# Patient Record
Sex: Female | Born: 1940
Health system: Southern US, Community
[De-identification: ages and names within clinical notes are randomized; demographics above are authoritative.]

## PROBLEM LIST (undated history)

## (undated) DIAGNOSIS — E669 Obesity, unspecified: Secondary | ICD-10-CM

## (undated) DIAGNOSIS — M8580 Other specified disorders of bone density and structure, unspecified site: Secondary | ICD-10-CM

## (undated) DIAGNOSIS — K317 Polyp of stomach and duodenum: Secondary | ICD-10-CM

## (undated) DIAGNOSIS — K5790 Diverticulosis of intestine, part unspecified, without perforation or abscess without bleeding: Secondary | ICD-10-CM

## (undated) DIAGNOSIS — L719 Rosacea, unspecified: Secondary | ICD-10-CM

## (undated) DIAGNOSIS — R112 Nausea with vomiting, unspecified: Secondary | ICD-10-CM

## (undated) DIAGNOSIS — E119 Type 2 diabetes mellitus without complications: Secondary | ICD-10-CM

## (undated) DIAGNOSIS — I4891 Unspecified atrial fibrillation: Secondary | ICD-10-CM

## (undated) DIAGNOSIS — T4145XA Adverse effect of unspecified anesthetic, initial encounter: Secondary | ICD-10-CM

## (undated) DIAGNOSIS — I10 Essential (primary) hypertension: Secondary | ICD-10-CM

## (undated) DIAGNOSIS — R5383 Other fatigue: Secondary | ICD-10-CM

## (undated) DIAGNOSIS — Z780 Asymptomatic menopausal state: Secondary | ICD-10-CM

## (undated) DIAGNOSIS — E785 Hyperlipidemia, unspecified: Secondary | ICD-10-CM

## (undated) DIAGNOSIS — Z9889 Other specified postprocedural states: Secondary | ICD-10-CM

## (undated) DIAGNOSIS — M1990 Unspecified osteoarthritis, unspecified site: Secondary | ICD-10-CM

## (undated) DIAGNOSIS — E039 Hypothyroidism, unspecified: Secondary | ICD-10-CM

## (undated) DIAGNOSIS — E559 Vitamin D deficiency, unspecified: Secondary | ICD-10-CM

## (undated) DIAGNOSIS — T8859XA Other complications of anesthesia, initial encounter: Secondary | ICD-10-CM

## (undated) DIAGNOSIS — E049 Nontoxic goiter, unspecified: Secondary | ICD-10-CM

## (undated) DIAGNOSIS — I639 Cerebral infarction, unspecified: Secondary | ICD-10-CM

## (undated) HISTORY — DX: Rosacea, unspecified: L71.9

## (undated) HISTORY — DX: Nontoxic goiter, unspecified: E04.9

## (undated) HISTORY — DX: Essential (primary) hypertension: I10

## (undated) HISTORY — DX: Polyp of stomach and duodenum: K31.7

## (undated) HISTORY — DX: Vitamin D deficiency, unspecified: E55.9

## (undated) HISTORY — DX: Hypothyroidism, unspecified: E03.9

## (undated) HISTORY — DX: Hyperlipidemia, unspecified: E78.5

## (undated) HISTORY — DX: Cerebral infarction, unspecified: I63.9

## (undated) HISTORY — DX: Asymptomatic menopausal state: Z78.0

## (undated) HISTORY — DX: Unspecified atrial fibrillation: I48.91

## (undated) HISTORY — DX: Type 2 diabetes mellitus without complications: E11.9

## (undated) HISTORY — DX: Diverticulosis of intestine, part unspecified, without perforation or abscess without bleeding: K57.90

## (undated) HISTORY — PX: TONSILLECTOMY: SUR1361

## (undated) HISTORY — DX: Other fatigue: R53.83

## (undated) HISTORY — DX: Obesity, unspecified: E66.9

## (undated) HISTORY — DX: Other specified disorders of bone density and structure, unspecified site: M85.80

## (undated) NOTE — *Deleted (*Deleted)
Stroke Discharge Summary  Patient ID: Sheila Ray   MRN: [id number]      DOB: Jun 20, 1941  Date of Admission: 05/03/2020 Date of Discharge: 05/10/2020  Attending Physician:  Micki Riley, MD, Stroke MD Consultant(s):   {consultation:[id number]}*** Patient's PCP:  Junie Spencer, FNP  Discharge Diagnoses: *** Active Problems:   Acute ischemic left MCA stroke (HCC)   Middle cerebral artery embolism, left   Hypoxia   Encephalopathy acute   Medications to be continued on Rehab Allergies as of 05/10/2020      Reactions   Crestor [rosuvastatin] Other (See Comments)   Cramps   Lipitor [atorvastatin] Other (See Comments)   Cramps   Pravastatin Other (See Comments)   Cramps    Keflex [cephalexin] Hives    Med Rec must be completed prior to using this SMARTLINK***       LABORATORY STUDIES CBC    Component Value Date/Time   WBC 13.8 (H) 05/09/2020 0353   RBC 4.33 05/09/2020 0353   HGB 10.0 (L) 05/09/2020 0353   HGB 13.1 09/16/2019 1136   HCT 33.9 (L) 05/09/2020 0353   HCT 41.4 09/16/2019 1136   PLT 349 05/09/2020 0353   PLT 333 09/16/2019 1136   MCV 78.3 (L) 05/09/2020 0353   MCV 80 09/16/2019 1136   MCH 23.1 (L) 05/09/2020 0353   MCHC 29.5 (L) 05/09/2020 0353   RDW 16.8 (H) 05/09/2020 0353   RDW 13.9 09/16/2019 1136   LYMPHSABS 0.8 05/04/2020 0317   LYMPHSABS 2.0 09/16/2019 1136   MONOABS 0.8 05/04/2020 0317   EOSABS 0.0 05/04/2020 0317   EOSABS 0.6 (H) 09/16/2019 1136   BASOSABS 0.0 05/04/2020 0317   BASOSABS 0.1 09/16/2019 1136   CMP    Component Value Date/Time   NA 135 05/09/2020 0353   NA 141 12/28/2019 1136   K 3.6 05/09/2020 0353   CL 109 05/09/2020 0353   CO2 15 (L) 05/09/2020 0353   GLUCOSE 115 (H) 05/09/2020 0353   BUN 18 05/09/2020 0353   BUN 12 12/28/2019 1136   CREATININE 0.82 05/09/2020 0353   CREATININE 0.75 01/06/2013 1354   CALCIUM 8.8 (L) 05/09/2020 0353   PROT 5.7 (L) 05/03/2020 1317   PROT 6.3 09/16/2019 1136   ALBUMIN 3.0  (L) 05/03/2020 1317   ALBUMIN 4.0 09/16/2019 1136   AST 15 05/03/2020 1317   ALT 12 05/03/2020 1317   ALKPHOS 52 05/03/2020 1317   BILITOT 0.5 05/03/2020 1317   BILITOT 0.2 09/16/2019 1136   GFRNONAA >60 05/09/2020 0353   GFRNONAA 80 01/06/2013 1354   GFRAA >60 02/07/2020 1119   GFRAA >89 01/06/2013 1354   COAGS Lab Results  Component Value Date   INR 1.2 05/03/2020   INR 3.3 (H) 02/07/2020   INR 0.94 07/19/2014   Lipid Panel    Component Value Date/Time   CHOL 173 05/04/2020 0317   CHOL 226 (H) 09/16/2019 1136   CHOL 173 01/06/2013 1354   TRIG 65 05/04/2020 0317   TRIG 120 05/06/2013 1341   TRIG 95 01/06/2013 1354   HDL 36 (L) 05/04/2020 0317   HDL 43 09/16/2019 1136   HDL 47 05/06/2013 1341   HDL 41 01/06/2013 1354   CHOLHDL 4.8 05/04/2020 0317   VLDL 13 05/04/2020 0317   LDLCALC 124 (H) 05/04/2020 0317   LDLCALC 158 (H) 09/16/2019 1136   LDLCALC 109 (H) 05/06/2013 1341   LDLCALC 113 (H) 01/06/2013 1354   HgbA1C  Lab Results  Component Value Date   HGBA1C 6.4 (H) 05/04/2020   Urinalysis    Component Value Date/Time   COLORURINE YELLOW 07/19/2014 0950   APPEARANCEUR CLEAR 07/19/2014 0950   LABSPEC 1.005 07/19/2014 0950   PHURINE 6.0 07/19/2014 0950   GLUCOSEU NEGATIVE 07/19/2014 0950   HGBUR NEGATIVE 07/19/2014 0950   BILIRUBINUR NEGATIVE 07/19/2014 0950   KETONESUR NEGATIVE 07/19/2014 0950   PROTEINUR NEGATIVE 07/19/2014 0950   UROBILINOGEN 0.2 07/19/2014 0950   NITRITE NEGATIVE 07/19/2014 0950   LEUKOCYTESUR TRACE (A) 07/19/2014 0950   Urine Drug Screen No results found for: LABOPIA, COCAINSCRNUR, LABBENZ, AMPHETMU, THCU, LABBARB  Alcohol Level    Component Value Date/Time   [facility] <10 05/03/2020 1317     SIGNIFICANT DIAGNOSTIC STUDIES EEG  Result Date: 05/04/2020 Charlsie Quest, MD     05/04/2020  1:46 PM Patient Name: Sheila Ray MRN: [id number] Epilepsy Attending: Charlsie Quest Referring Physician/Provider: Dr Caryl Pina Date:  05/04/2020 Duration: 23.54 mins Patient history: 36 year old female with left MCA stroke s/p revascularization, with AMS. EEG to evaluate for seizure. Level of alertness: Awake AEDs during EEG study: None Technical aspects: This EEG study was done with scalp electrodes positioned according to the 10-20 International system of electrode placement. Electrical activity was acquired at a sampling rate of 500Hz  and reviewed with a high frequency filter of 70Hz  and a low frequency filter of 1Hz . EEG data were recorded continuously and digitally stored. Description: The posterior dominant rhythm consists of 9 Hz activity of moderate voltage (25-35 uV) seen predominantly in posterior head regions, asymmetric ( L<R) and reactive to eye opening and eye closing.  EEG showed continuous 3 to 5 Hz theta-delta slowing.  Left hemisphere, maximal left temporal region which at times appeared sharply contoured.  Hyperventilation and photic stimulation were not performed.   ABNORMALITY -Continuous slow, lateralized left hemisphere, maximal left temporal region -Background asymmetry, left<right IMPRESSION: This study is suggestive of cortical dysfunction in left hemisphere, maximal left temporal region consistent with underlying stroke.  No seizures or definite epileptiform discharges were seen throughout the recording. If suspicion for interictal activity remains a concern, a prolonged study can be considered. Charlsie Quest   CT Angio Head W or Wo Contrast  Addendum Date: 05/03/2020   ADDENDUM REPORT: 05/03/2020 14:38 ADDENDUM: Exam and technique should not include CT perfusion, which was performed subsequently and dictated separately. Only CT head without contrast and CT angiography of the head and neck with contrast performed. Electronically Signed   By: Guadlupe Spanish M.D.   On: 05/03/2020 14:38   Result Date: 05/03/2020 CLINICAL DATA:  Code stroke.  Right-sided weakness EXAM: CT HEAD WITHOUT CONTRAST CT ANGIOGRAPHY HEAD AND  NECK CT PERFUSION BRAIN TECHNIQUE: Contiguous axial images were obtained the skull base to the vertex without contrast. Multidetector CT imaging of the head and neck was performed using the standard protocol during bolus administration of intravenous contrast. Multiplanar CT image reconstructions and MIPs were obtained to evaluate the vascular anatomy. Carotid stenosis measurements (when applicable) are obtained utilizing NASCET criteria, using the distal internal carotid diameter as the denominator. Multiphase CT imaging of the brain was performed following IV bolus contrast injection. Subsequent parametric perfusion maps were calculated using RAPID software. CONTRAST:  75mL OMNIPAQUE IOHEXOL 350 MG/ML SOLN COMPARISON:  None. FINDINGS: CT HEAD Brain: There is no acute intracranial hemorrhage, mass effect, or edema. Gray-white differentiation is preserved. Patchy and confluent areas hypoattenuation in the supratentorial white but may reflect moderate chronic microvascular  ischemic changes. There is no extra-axial fluid collection. Prominence of the ventricles and sulci reflects generalized parenchymal volume loss. Vascular: No hyperdense vessel. Skull: Calvarium is unremarkable. Sinuses/Orbits: No acute finding. Other: None. ASPECTS ([place] Stroke Program Early CT Score) - Ganglionic level infarction (caudate, lentiform nuclei, internal capsule, insula, M1-M3 cortex): 7 - Supraganglionic infarction (M4-M6 cortex): 3 Total score (0-10 with 10 being normal): 10 CTA NECK Aortic arch: Great vessel origins are patent. Right carotid system: Patent. Mild calcified plaque at the ICA origin causing minimal stenosis. Left carotid system: Patent. Minimal calcified plaque at the proximal ICA without measurable stenosis. Vertebral arteries: Patent.  Right vertebral artery is dominant. Skeleton: Degenerative changes of the cervical spine primarily C6-C7. Other neck: Heterogeneous, multinodular thyroid. Further ultrasound  follow-up is not recommended by current guidelines. Upper chest: Partially imaged small bilateral pleural effusions. Peribronchial and interstitial thickening likely reflecting interstitial edema. Review of the MIP images confirms the above findings CTA HEAD Anterior circulation: Intracranial internal carotid arteries are patent with mild calcified plaque. Left M1 MCA is patent. There is occlusion of a M2 MCA branch approximately 13 mm from the MCA trifurcation (see saved key image series 11, 27). There is partial reconstitution followed by occlusion and subsequent distal M3 reconstitution. Right middle and both anterior cerebral arteries are patent. Posterior circulation: Intracranial vertebral arteries are patent with minimal calcified plaque on the right. Basilar artery is patent. Posterior cerebral arteries are patent. There are bilateral posterior communicating arteries present. Venous sinuses: Patent as allowed by contrast bolus timing. Review of the MIP images confirms the above findings IMPRESSION: No acute intracranial hemorrhage. No acute infarction identified. ASPECT score is 10. 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 No hemodynamically significant stenosis the neck. Partially imaged mild interstitial pulmonary edema and small bilateral pleural effusions. Results were called by telephone at the time of interpretation on 05/03/2020 at 1:09 pm and again at 1:17 p.m. to provider [facility] , who verbally acknowledged these results. Electronically Signed: By: Guadlupe Spanish M.D. On: 05/03/2020 13:32   CT Angio Neck W and/or Wo Contrast  Addendum Date: 05/03/2020   ADDENDUM REPORT: 05/03/2020 14:38 ADDENDUM: Exam and technique should not include CT perfusion, which was performed subsequently and dictated separately. Only CT head without contrast and CT angiography of the head and neck with contrast performed. Electronically Signed   By: Guadlupe Spanish M.D.   On: 05/03/2020  14:38   Result Date: 05/03/2020 CLINICAL DATA:  Code stroke.  Right-sided weakness EXAM: CT HEAD WITHOUT CONTRAST CT ANGIOGRAPHY HEAD AND NECK CT PERFUSION BRAIN TECHNIQUE: Contiguous axial images were obtained the skull base to the vertex without contrast. Multidetector CT imaging of the head and neck was performed using the standard protocol during bolus administration of intravenous contrast. Multiplanar CT image reconstructions and MIPs were obtained to evaluate the vascular anatomy. Carotid stenosis measurements (when applicable) are obtained utilizing NASCET criteria, using the distal internal carotid diameter as the denominator. Multiphase CT imaging of the brain was performed following IV bolus contrast injection. Subsequent parametric perfusion maps were calculated using RAPID software. CONTRAST:  75mL OMNIPAQUE IOHEXOL 350 MG/ML SOLN COMPARISON:  None. FINDINGS: CT HEAD Brain: There is no acute intracranial hemorrhage, mass effect, or edema. Gray-white differentiation is preserved. Patchy and confluent areas hypoattenuation in the supratentorial white but may reflect moderate chronic microvascular ischemic changes. There is no extra-axial fluid collection. Prominence of the ventricles and sulci reflects generalized parenchymal volume loss. Vascular: No hyperdense vessel.  Skull: Calvarium is unremarkable. Sinuses/Orbits: No acute finding. Other: None. ASPECTS ([place] Stroke Program Early CT Score) - Ganglionic level infarction (caudate, lentiform nuclei, internal capsule, insula, M1-M3 cortex): 7 - Supraganglionic infarction (M4-M6 cortex): 3 Total score (0-10 with 10 being normal): 10 CTA NECK Aortic arch: Great vessel origins are patent. Right carotid system: Patent. Mild calcified plaque at the ICA origin causing minimal stenosis. Left carotid system: Patent. Minimal calcified plaque at the proximal ICA without measurable stenosis. Vertebral arteries: Patent.  Right vertebral artery is dominant.  Skeleton: Degenerative changes of the cervical spine primarily C6-C7. Other neck: Heterogeneous, multinodular thyroid. Further ultrasound follow-up is not recommended by current guidelines. Upper chest: Partially imaged small bilateral pleural effusions. Peribronchial and interstitial thickening likely reflecting interstitial edema. Review of the MIP images confirms the above findings CTA HEAD Anterior circulation: Intracranial internal carotid arteries are patent with mild calcified plaque. Left M1 MCA is patent. There is occlusion of a M2 MCA branch approximately 13 mm from the MCA trifurcation (see saved key image series 11, 27). There is partial reconstitution followed by occlusion and subsequent distal M3 reconstitution. Right middle and both anterior cerebral arteries are patent. Posterior circulation: Intracranial vertebral arteries are patent with minimal calcified plaque on the right. Basilar artery is patent. Posterior cerebral arteries are patent. There are bilateral posterior communicating arteries present. Venous sinuses: Patent as allowed by contrast bolus timing. Review of the MIP images confirms the above findings IMPRESSION: No acute intracranial hemorrhage. No acute infarction identified. ASPECT score is 10. 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 No hemodynamically significant stenosis the neck. Partially imaged mild interstitial pulmonary edema and small bilateral pleural effusions. Results were called by telephone at the time of interpretation on 05/03/2020 at 1:09 pm and again at 1:17 p.m. to provider [facility] , who verbally acknowledged these results. Electronically Signed: By: Guadlupe Spanish M.D. On: 05/03/2020 13:32   MR ANGIO HEAD WO CONTRAST  Result Date: 05/04/2020 CLINICAL DATA:  Stroke, follow-up. EXAM: MRI HEAD WITHOUT CONTRAST MRA HEAD WITHOUT CONTRAST TECHNIQUE: Multiplanar, multiecho pulse sequences of the brain and surrounding structures were  obtained without intravenous contrast. Angiographic images of the head were obtained using MRA technique without contrast. COMPARISON:  CT/CT angiogram of the head and neck May 03, 2020. FINDINGS: MRI HEAD FINDINGS Brain: Cortical restricted diffusion is seen within the left temporal occipital region. Scattered foci of restricted diffusion are also seen in the left frontoparietal and insular regions and left amygdala. Focus of susceptibility artifact in the left temporal region may represent residual clot in a distal branch versus small focus of hemorrhage. No hemorrhagic transformation, significant mass effect or midline shift. No hydrocephalus or mass lesion. Scattered and confluent foci of T2 hyperintensity are seen within the white matter of the cerebral hemispheres, nonspecific, most likely related to chronic small vessel ischemia. 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 Moderate parenchymal volume loss. Vascular: Normal flow voids. Skull and upper cervical spine: Normal marrow signal. Sinuses/Orbits: Mild mucosal thickening of the ethmoid cells. The orbits are maintained. MRA HEAD FINDINGS The study is partially degraded by motion. Normal flow related enhancement is seen within the upper cervical and intracranial segment of the bilateral ICAs. Moderate stenosis of the right carotid terminus is likely artifactual given no stenosis on recent CTA. The bilateral M1-M2/M segments and bilateral A1-A2/ACA segments have normal flow related enhancement. Hypoplastic left vertebral artery with a dominant right vertebral artery with normal flow related enhancement. The  basilar artery and bilateral posterior cerebral arteries are also patent. Luminal irregularity along the bilateral PCAs is suggestive of intracranial atherosclerotic disease. No proximal occlusion or stenosis, aneurysm or vascular malformation. IMPRESSION: 1. Cortical restricted diffusion within the left temporal occipital region. Scattered  foci of restricted diffusion are also seen in the left frontoparietal and insular regions and left amygdala. Findings are consistent with acute infarcts. No hemorrhagic transformation, significant mass effect or midline shift. 2. No proximal occlusion or stenosis in the anterior or posterior circulation. Results communicated to Dr. Arbutus Leas via [facility] pager system. Electronically Signed   By: Baldemar Lenis M.D.   On: 05/04/2020 14:19   MR ANGIO HEAD WO CONTRAST  Result Date: 05/03/2020 CLINICAL DATA:  Follow-up examination for acute stroke. Patient with previously identified left M2 branch occlusion, superior division. Status post catheter directed intervention achieving TICI2C revascularization. Recanalized A2/A3 filling defect also noted on arteriogram. EXAM: MRI HEAD WITHOUT CONTRAST MRA HEAD WITHOUT CONTRAST TECHNIQUE: Multiplanar, multiecho pulse sequences of the brain and surrounding structures were obtained without intravenous contrast. Angiographic images of the head were obtained using MRA technique without contrast. COMPARISON:  Prior CTs from earlier the same day. FINDINGS: MRI HEAD FINDINGS Brain: Examination mildly degraded by motion artifact. Generalized age-related cerebral atrophy. Patchy and confluent T2/FLAIR hyperintensity within the periventricular, deep, and subcortical white matter both cerebral hemispheres, nonspecific, but most like related chronic microvascular ischemic disease, moderate in nature. Superimposed remote lacunar infarct at the left corona radiata/basal ganglia. Patchy areas of restricted diffusion seen involving the cortical gray matter of the left insula, left temporal occipital region, and mesial left temporal lobe are seen, consistent with small volume acute ischemic infarcts (series 5, images 70, 69, 67). These are predominantly left MCA distribution. Additional patchy small volume cortical infarcts noted at the high left frontoparietal region (series  5, images 91, 85), left ACA and MCA distribution. No associated mass effect. Single small focus of associated petechial hemorrhage present at the left temporal region (series 18, image 27). No other evidence for acute or subacute ischemia. Gray-white matter differentiation otherwise maintained. No mass lesion, mass effect, or midline shift. No hydrocephalus or extra-axial fluid collection. Pituitary gland and suprasellar region within normal limits. Vascular: Major intracranial vascular flow voids are maintained. Skull and upper cervical spine: Craniocervical junction within normal limits. Bone marrow signal intensity normal. No scalp soft tissue abnormality. Sinuses/Orbits: Globes and orbital soft tissues within normal limits. Paranasal sinuses are clear. No mastoid effusion. Other: None. MRA HEAD FINDINGS ANTERIOR CIRCULATION: Examination degraded by motion artifact. Visualized distal cervical segments of the internal carotid arteries are grossly patent with antegrade flow. Apparent short-segment defect at the distal cervical left ICA felt to be due to motion artifact, as no stenosis or other defect seen within this region on corresponding arteriogram. Petrous, cavernous, and supraclinoid segments of both internal carotid arteries patent without appreciable stenosis or other abnormality. A1 segments patent bilaterally. Grossly normal anterior communicating artery complex. Partially visualized ACAs perfused to their distal aspects without appreciable stenosis. M1 segments patent bilaterally. Left MCA trifurcations. Previously identified occluded left M2 branch appears grossly patent status post revascularization. MCA branches otherwise well perfused bilaterally. POSTERIOR CIRCULATION: Right vertebral artery strongly dominant and patent to the vertebrobasilar junction. Right PICA not definitely seen. Left vertebral artery hypoplastic and largely terminates in PICA, although a small branch seen ascending towards the  vertebrobasilar junction. Left PICA patent proximally. Basilar diffusely irregular but is patent to its distal aspect without high-grade  stenosis. Superior cerebral arteries grossly patent proximally. Both PCAs are patent proximally, not well assessed distally due to motion. No intracranial aneurysm. IMPRESSION: MRI HEAD IMPRESSION: 1. 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 Associated minimal petechial hemorrhage at the left temporal region without significant mass effect. 2. Underlying age-related cerebral atrophy with moderate chronic microvascular ischemic disease. MRA HEAD IMPRESSION: 1. Technically limited exam due to motion artifact. 2. Previously identified occluded left M2 branch appears grossly patent status post revascularization. 3. Otherwise grossly stable and negative intracranial MRA. No other large vessel occlusion. No other hemodynamically significant or correctable stenosis. Electronically Signed   By: Rise Mu M.D.   On: 05/03/2020 22:06   MR BRAIN WO CONTRAST  Result Date: 05/04/2020 CLINICAL DATA:  Stroke, follow-up. EXAM: MRI HEAD WITHOUT CONTRAST MRA HEAD WITHOUT CONTRAST TECHNIQUE: Multiplanar, multiecho pulse sequences of the brain and surrounding structures were obtained without intravenous contrast. Angiographic images of the head were obtained using MRA technique without contrast. COMPARISON:  CT/CT angiogram of the head and neck May 03, 2020. FINDINGS: MRI HEAD FINDINGS Brain: Cortical restricted diffusion is seen within the left temporal occipital region. Scattered foci of restricted diffusion are also seen in the left frontoparietal and insular regions and left amygdala. Focus of susceptibility artifact in the left temporal region may represent residual clot in a distal branch versus small focus of hemorrhage. No hemorrhagic transformation, significant mass effect or midline shift. No  hydrocephalus or mass lesion. Scattered and confluent foci of T2 hyperintensity are seen within the white matter of the cerebral hemispheres, nonspecific, most likely related to chronic small vessel ischemia. 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 Moderate parenchymal volume loss. Vascular: Normal flow voids. Skull and upper cervical spine: Normal marrow signal. Sinuses/Orbits: Mild mucosal thickening of the ethmoid cells. The orbits are maintained. MRA HEAD FINDINGS The study is partially degraded by motion. Normal flow related enhancement is seen within the upper cervical and intracranial segment of the bilateral ICAs. Moderate stenosis of the right carotid terminus is likely artifactual given no stenosis on recent CTA. The bilateral M1-M2/M segments and bilateral A1-A2/ACA segments have normal flow related enhancement. Hypoplastic left vertebral artery with a dominant right vertebral artery with normal flow related enhancement. The basilar artery and bilateral posterior cerebral arteries are also patent. Luminal irregularity along the bilateral PCAs is suggestive of intracranial atherosclerotic disease. No proximal occlusion or stenosis, aneurysm or vascular malformation. IMPRESSION: 1. Cortical restricted diffusion within the left temporal occipital region. Scattered foci of restricted diffusion are also seen in the left frontoparietal and insular regions and left amygdala. Findings are consistent with acute infarcts. No hemorrhagic transformation, significant mass effect or midline shift. 2. No proximal occlusion or stenosis in the anterior or posterior circulation. Results communicated to Dr. Arbutus Leas via [facility] pager system. Electronically Signed   By: Baldemar Lenis M.D.   On: 05/04/2020 14:19   MR BRAIN WO CONTRAST  Result Date: 05/03/2020 CLINICAL DATA:  Follow-up examination for acute stroke. Patient with previously identified left M2 branch occlusion, superior division.  Status post catheter directed intervention achieving TICI2C revascularization. Recanalized A2/A3 filling defect also noted on arteriogram. EXAM: MRI HEAD WITHOUT CONTRAST MRA HEAD WITHOUT CONTRAST TECHNIQUE: Multiplanar, multiecho pulse sequences of the brain and surrounding structures were obtained without intravenous contrast. Angiographic images of the head were obtained using MRA technique without contrast. COMPARISON:  Prior CTs from earlier the same day. FINDINGS: MRI  HEAD FINDINGS Brain: Examination mildly degraded by motion artifact. Generalized age-related cerebral atrophy. Patchy and confluent T2/FLAIR hyperintensity within the periventricular, deep, and subcortical white matter both cerebral hemispheres, nonspecific, but most like related chronic microvascular ischemic disease, moderate in nature. Superimposed remote lacunar infarct at the left corona radiata/basal ganglia. Patchy areas of restricted diffusion seen involving the cortical gray matter of the left insula, left temporal occipital region, and mesial left temporal lobe are seen, consistent with small volume acute ischemic infarcts (series 5, images 70, 69, 67). These are predominantly left MCA distribution. Additional patchy small volume cortical infarcts noted at the high left frontoparietal region (series 5, images 91, 85), left ACA and MCA distribution. No associated mass effect. Single small focus of associated petechial hemorrhage present at the left temporal region (series 18, image 27). No other evidence for acute or subacute ischemia. Gray-white matter differentiation otherwise maintained. No mass lesion, mass effect, or midline shift. No hydrocephalus or extra-axial fluid collection. Pituitary gland and suprasellar region within normal limits. Vascular: Major intracranial vascular flow voids are maintained. Skull and upper cervical spine: Craniocervical junction within normal limits. Bone marrow signal intensity normal. No scalp soft  tissue abnormality. Sinuses/Orbits: Globes and orbital soft tissues within normal limits. Paranasal sinuses are clear. No mastoid effusion. Other: None. MRA HEAD FINDINGS ANTERIOR CIRCULATION: Examination degraded by motion artifact. Visualized distal cervical segments of the internal carotid arteries are grossly patent with antegrade flow. Apparent short-segment defect at the distal cervical left ICA felt to be due to motion artifact, as no stenosis or other defect seen within this region on corresponding arteriogram. Petrous, cavernous, and supraclinoid segments of both internal carotid arteries patent without appreciable stenosis or other abnormality. A1 segments patent bilaterally. Grossly normal anterior communicating artery complex. Partially visualized ACAs perfused to their distal aspects without appreciable stenosis. M1 segments patent bilaterally. Left MCA trifurcations. Previously identified occluded left M2 branch appears grossly patent status post revascularization. MCA branches otherwise well perfused bilaterally. POSTERIOR CIRCULATION: Right vertebral artery strongly dominant and patent to the vertebrobasilar junction. Right PICA not definitely seen. Left vertebral artery hypoplastic and largely terminates in PICA, although a small branch seen ascending towards the vertebrobasilar junction. Left PICA patent proximally. Basilar diffusely irregular but is patent to its distal aspect without high-grade stenosis. Superior cerebral arteries grossly patent proximally. Both PCAs are patent proximally, not well assessed distally due to motion. No intracranial aneurysm. IMPRESSION: MRI HEAD IMPRESSION: 1. 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 Associated minimal petechial hemorrhage at the left temporal region without significant mass effect. 2. Underlying age-related cerebral atrophy with moderate chronic microvascular ischemic  disease. MRA HEAD IMPRESSION: 1. Technically limited exam due to motion artifact. 2. Previously identified occluded left M2 branch appears grossly patent status post revascularization. 3. Otherwise grossly stable and negative intracranial MRA. No other large vessel occlusion. No other hemodynamically significant or correctable stenosis. Electronically Signed   By: Rise Mu M.D.   On: 05/03/2020 22:06   IR CT Head Ltd  Result Date: 05/05/2020 INDICATION: New onset of aphasia, slurred speech, right facial weakness, and right leg weakness. 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 EXAM: 1. EMERGENT LARGE VESSEL OCCLUSION THROMBOLYSIS (anterior CIRCULATION) COMPARISON:  CT angiogram of the head and neck May 03, 2020. MEDICATIONS: Ancef 2 g IV antibiotic was administered within 1 hour of the procedure. ANESTHESIA/SEDATION:  General anesthesia CONTRAST:  Isovue 300 approximately 80 mL FLUOROSCOPY TIME:  Fluoroscopy Time: 38 minutes 30 seconds (1809 mGy). COMPLICATIONS: None immediate. TECHNIQUE: Following a full explanation of the procedure along with the potential associated complications, an informed witnessed consent was obtained. The risks of intracranial hemorrhage of 10%, worsening neurological deficit, ventilator dependency, death and inability to revascularize were all reviewed in detail with the patient's . The patient was then put under general anesthesia by the Department of Anesthesiology at [facility]. The right groin was prepped and draped in the usual sterile fashion. Thereafter using modified Seldinger technique, transfemoral access into the right common femoral artery was obtained without difficulty. Over a 0.035 inch guidewire an 8 French 25 cm Pinnacle sheath was inserted. Through this, and also over a 0.035 inch guidewire a 5 [place] JB 1 catheter was advanced to the aortic arch region and  selectively positioned in the left common carotid artery. An arteriogram was then performed centered extra cranially and intracranially. FINDINGS: The left common carotid arteriogram demonstrates the left external carotid artery and its major branches to be widely patent. The left internal carotid artery at the bulb to the cranial skull base also demonstrates wide patency. The petrous, the cavernous and the supraclinoid segments are widely patent. A small infundibulum is seen at the origin of the left posterior communicating artery. Also observed is the broad-based saccular outpouching along the posterior wall of the left internal carotid artery opposite the origin of the ophthalmic artery. The left middle cerebral artery demonstrates patency of the left M1 segment. The superior division demonstrates a M2 branch occlusion. Additionally noted is occlusion of the left middle cerebral artery inferior division in the distal M3 region. The left anterior cerebral artery opacifies into the capillary and venous phases. PROCEDURE: The JB 1 catheter in the left common carotid artery was exchanged over a 0.035 inch 300 cm Rosen exchange guidewire for a [employer] 8 French 95 cm catheter which was positioned in the proximal 1/3 of the left internal carotid artery. The guidewire was removed. Good aspiration obtained from the hub of the TracStar guide catheter. A gentle control arteriogram performed through this demonstrated no evidence of spasms, dissections or of intraluminal filling defects. Over a 0.014 inch standard Synchro micro guidewire with a J configuration, a 132 cm 55 Zoom aspiration catheter with a 35 136 aspiration catheter combination was advanced to the supraclinoid left ICA. The micro guidewire was then gently advanced into the left middle cerebral artery occluded superior division in the M2 segment. The guidewire was removed after advancement of the 35 aspiration catheter in the distal M2 M3 region. The  guidewire was removed. Good aspiration obtained from the hub of the 35 Zoom aspiration catheter. Thereafter, with aspiration being applied at the hub of the 55 Zoom aspiration catheter, the TracStar aspiration catheter using a 60 mL syringe, and a 20 mL syringe at the hub of the 35 Zoom aspiration catheter for approximately 1-1/2 minutes, the combination of the 35 and 55 were retrieved and removed. Clots were seen within the aspirate, and also in the 35 Zoom aspiration catheter. Control arteriogram performed through the TracStar guide catheter in the distal left internal carotid artery demonstrated no significant change in the occluded superior division branch M2 segment. A second pass was then made in a similar fashion with 55 Zoom aspiration catheter, and the 35 Zoom aspiration catheter advanced into the distal M2 M3 segment of the superior division. The 55 aspiration catheter was  engaged in the proximal occluded vessel. Aspiration applied for approximately 2 minutes at the 55 Zoom aspiration catheter with a Penumbra aspiration device, with a 20 mL syringe at the hub of the 35 Zoom aspiration catheter, and with a 20 mL syringe at the hub of the TracStar catheter. Combination was then retrieved and removed. Copious amounts of clot were seen within the canister. Also seen were bits of clot in the 55 aspiration catheter. A control arteriogram performed through the TracStar catheter in the left internal carotid artery now demonstrated complete revascularization of the superior division M2 segment. The M3 segment of the inferior division remained occluded. The 55 Zoom aspiration catheter was then advanced with an 021 Trevo ProVue microcatheter combination over a 0.014 inch micro guidewire with a J-tip configuration to the left middle cerebral artery distally. The micro guidewire was then gently manipulated and advanced through the inferior division into the M3 region followed by the microcatheter. The microcatheter  could not be advanced on account of severe tortuosity of the distal M3 occluded segment. However, the micro guidewire was advanced in a to and fro fashion through the occlusion multiple times in order to establish some distal flow. The micro guidewire was then retrieved and removed. A control arteriogram performed through the microcatheter in the inferior division now demonstrated slow advancement of contrast distal to this. The microcatheter and micro guidewire were retrieved and removed. A control arteriogram performed through the 55 aspiration catheter in the cavernous left ICA demonstrated a TICI 2C revascularization of the left MCA distribution. The left posterior communicating artery remained patent. Slow recanalization was evident in the M3 region of the inferior division. However, also noted now was a filling defect in the anterior cerebral artery A2 A3 junction with slow flow distal to this. Proximal stenosis probably related to intracranial arteriosclerosis was evident. It was, therefore, decided not to attempt at aspiration at this site as slow recanalization was evident. A final control arteriogram performed through the TracStar guide sheath in the left common carotid artery demonstrated wide patency of the left internal carotid artery proximally and distally. The left middle cerebral artery continued to demonstrate a TICI 2c revascularization with mild to modestly improved flow through the distal inferior division M3 segment. Also noted was improved flow through the left anterior cerebral artery in the A2 A3 junction. A flat panel CT of the brain demonstrated no evidence of intracranial hemorrhage, mass effect or midline shift. No gross hypodensities were evident. The right groin 8 French sheath was removed with manual compression applied for 30 minutes. Hemostasis was achieved. Distal pulses remained Dopplerable and unchanged in both feet. The patient was extubated without event. Upon recovery, the  patient was able to move her left arm and leg spontaneously and also lift the right leg spontaneously. There continued be weakness of the right upper extremity. Also the patient demonstrated difficulty comprehending simple instructions. She was then transferred to the neuro ICU for post revascularization management. IMPRESSION: Status post endovascular revascularization of M2 superior division branch of the left middle cerebral artery, and partially of the left middle cerebral inferior division in the distal M3 region achieving a TICI 2c revascularization. Incidental note made of an approximately 3.5 mm wide neck saccular outpouching from the posterior wall of the left internal carotid artery at the level origin of the ophthalmic artery. PLAN: Follow-up in the clinic approximately 4 to 6 weeks post discharge. Electronically Signed   By: Julieanne Cotton M.D.   On: 05/04/2020 15:48  CT CEREBRAL PERFUSION W CONTRAST  Result Date: 05/03/2020 CLINICAL DATA:  Right-sided weakness, M2 MCA occlusion EXAM: CT PERFUSION BRAIN TECHNIQUE: Multiphase CT imaging of the brain was performed following IV bolus contrast injection. Subsequent parametric perfusion maps were calculated using RAPID software. CONTRAST:  40mL OMNIPAQUE IOHEXOL 350 MG/ML SOLN COMPARISON:  None. FINDINGS: CT Brain Perfusion Findings: CBF (<30%) Volume: 0mL Perfusion (Tmax>6.0s) volume: 67mL Mismatch Volume: 67mL ASPECTS on noncontrast CT Head: 10 at 1:02 p.m. today. Infarct Core: 0 mL Infarction Location:Territory at risk in the posterior left MCA territory IMPRESSION: No core infarction identified. 67 mL of penumbra in the posterior left MCA territory. Electronically Signed   By: Guadlupe Spanish M.D.   On: 05/03/2020 14:36   DG CHEST PORT 1 VIEW  Result Date: 05/05/2020 CLINICAL DATA:  Hypoxia EXAM: PORTABLE CHEST 1 VIEW COMPARISON:  05/03/2020 FINDINGS: Cardiomegaly and vascular pedicle widening. Diffuse hazy and interstitial opacity with small  pleural effusions. Negative for pneumothorax. IMPRESSION: CHF pattern with mild improvement 2 days ago. Electronically Signed   By: Marnee Spring M.D.   On: 05/05/2020 04:02   DG CHEST PORT 1 VIEW  Result Date: 05/03/2020 CLINICAL DATA:  Hypoxia, recent cerebral revascularization. EXAM: PORTABLE CHEST 1 VIEW COMPARISON:  02/07/2020 FINDINGS: Cardiac shadow is enlarged but stable. Diffuse vascular congestion is noted with some patchy edema particularly in the right lung. Small effusions are noted bilaterally. No bony abnormality is seen. IMPRESSION: Vascular congestion with mild parenchymal edema on the right consistent with congestive failure. Electronically Signed   By: Alcide Clever M.D.   On: 05/03/2020 19:25   ECHOCARDIOGRAM COMPLETE  Result Date: 05/04/2020    ECHOCARDIOGRAM REPORT   Patient Name:   SHEQUITA PEPLINSKI Date of Exam: 05/04/2020 Medical Rec #:  [id number]       Height:       64.5 in Accession #:    [id number]      Weight:       175.0 lb Date of Birth:  Feb 16, 1941        BSA:          1.859 m Patient Age:    79 years        BP:           121/79 mmHg Patient Gender: F               HR:           96 bpm. Exam Location:  Inpatient Procedure: 2D Echo, Cardiac Doppler, Color Doppler and Intracardiac            Opacification Agent Indications:    CVA  History:        Patient has no prior history of Echocardiogram examinations.                 Stroke, Arrythmias:Atrial Fibrillation; Risk                 Factors:Hypertension, Diabetes, Dyslipidemia and Obesity.  Sonographer:    Lavenia Atlas Referring Phys: [id number] SRISHTI L BHAGAT IMPRESSIONS  1. Left ventricular ejection fraction, by estimation, is 55 to 60%. The left ventricle has normal function. The left ventricle has no regional wall motion abnormalities. There is moderate concentric left ventricular hypertrophy. Left ventricular diastolic function could not be evaluated.  2. Right ventricular systolic function is mildly reduced. The right  ventricular size is moderately enlarged. There is moderately elevated pulmonary artery systolic pressure. The estimated right ventricular systolic pressure is 47.7 mmHg.  3. Left atrial size was moderately dilated.  4. Right atrial size was severely dilated.  5. A small pericardial effusion is present. The pericardial effusion is posterior to the left ventricle. Large pleural effusion in the left lateral region.  6. The mitral valve is normal in structure. Moderate mitral valve regurgitation. No evidence of mitral stenosis.  7. Tricuspid valve regurgitation is moderate to severe.  8. The aortic valve is normal in structure. Aortic valve regurgitation is trivial. No aortic stenosis is present.  9. The inferior vena cava is normal in size with greater than 50% respiratory variability, suggesting right atrial pressure of 3 mmHg. Conclusion(s)/Recommendation(s): No intracardiac source of embolism detected on this transthoracic study. A transesophageal echocardiogram is recommended to exclude cardiac source of embolism if clinically indicated. FINDINGS  Left Ventricle: Left ventricular ejection fraction, by estimation, is 55 to 60%. The left ventricle has normal function. The left ventricle has no regional wall motion abnormalities. Definity contrast agent was given IV to delineate the left ventricular  endocardial borders. The left ventricular internal cavity size was normal in size. There is moderate concentric left ventricular hypertrophy. Left ventricular diastolic function could not be evaluated due to atrial fibrillation. Left ventricular diastolic function could not be evaluated. Normal left ventricular filling pressure. Right Ventricle: The right ventricular size is moderately enlarged. No increase in right ventricular wall thickness. Right ventricular systolic function is mildly reduced. There is moderately elevated pulmonary artery systolic pressure. The tricuspid regurgitant velocity is 2.86 m/s, and with an  assumed right atrial pressure of 15 mmHg, the estimated right ventricular systolic pressure is 47.7 mmHg. Left Atrium: Left atrial size was moderately dilated. Right Atrium: Right atrial size was severely dilated. Pericardium: A small pericardial effusion is present. The pericardial effusion is posterior to the left ventricle. Mitral Valve: The mitral valve is normal in structure. Moderate mitral valve regurgitation. No evidence of mitral valve stenosis. Tricuspid Valve: The tricuspid valve is normal in structure. Tricuspid valve regurgitation is moderate to severe. No evidence of tricuspid stenosis. Aortic Valve: The aortic valve is normal in structure. Aortic valve regurgitation is trivial. No aortic stenosis is present. Pulmonic Valve: The pulmonic valve was normal in structure. Pulmonic valve regurgitation is not visualized. No evidence of pulmonic stenosis. Aorta: The aortic root is normal in size and structure. Venous: The inferior vena cava is normal in size with greater than 50% respiratory variability, suggesting right atrial pressure of 3 mmHg. IAS/Shunts: No atrial level shunt detected by color flow Doppler. Additional Comments: No apical thrombus on Definity echocontrast images. There is a large pleural effusion in the left lateral region.  LEFT VENTRICLE PLAX 2D LVIDd:         4.10 cm  Diastology LVIDs:         2.80 cm  LV e' medial:    5.44 cm/s LV PW:         1.30 cm  LV E/e' medial:  17.2 LV IVS:        1.20 cm  LV e' lateral:   7.94 cm/s LVOT diam:     2.00 cm  LV E/e' lateral: 11.8 LV SV:         33 LV SV Index:   18 LVOT Area:     3.14 cm  RIGHT VENTRICLE RV Basal diam:  3.30 cm RV S prime:     6.96 cm/s TAPSE (M-mode): 2.9 cm LEFT ATRIUM             Index  RIGHT ATRIUM           Index LA diam:        4.60 cm 2.48 cm/m  RA Area:     18.00 cm LA Vol (A2C):   69.7 ml 37.50 ml/m RA Volume:   47.80 ml  25.72 ml/m LA Vol (A4C):   75.7 ml 40.73 ml/m LA Biplane Vol: 74.7 ml 40.19 ml/m  AORTIC  VALVE LVOT Vmax:   71.10 cm/s LVOT Vmean:  44.800 cm/s LVOT VTI:    0.105 m  AORTA Ao Root diam: 2.70 cm MITRAL VALVE               TRICUSPID VALVE MV Area (PHT): 3.77 cm    TR Peak grad:   32.7 mmHg MV Decel Time: 201 msec    TR Vmax:        286.00 cm/s MV E velocity: 93.60 cm/s                            SHUNTS                            Systemic VTI:  0.10 m                            Systemic Diam: 2.00 cm Tobias Alexander MD Electronically signed by Tobias Alexander MD Signature Date/Time: 05/04/2020/11:16:53 AM    Final    IR PERCUTANEOUS ART THROMBECTOMY/INFUSION INTRACRANIAL INC DIAG ANGIO  Result Date: 05/05/2020 INDICATION: New onset of aphasia, slurred speech, right facial weakness, and right leg weakness. 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 EXAM: 1. EMERGENT LARGE VESSEL OCCLUSION THROMBOLYSIS (anterior CIRCULATION) COMPARISON:  CT angiogram of the head and neck May 03, 2020. MEDICATIONS: Ancef 2 g IV antibiotic was administered within 1 hour of the procedure. ANESTHESIA/SEDATION: General anesthesia CONTRAST:  Isovue 300 approximately 80 mL FLUOROSCOPY TIME:  Fluoroscopy Time: 38 minutes 30 seconds (1809 mGy). COMPLICATIONS: None immediate. TECHNIQUE: Following a full explanation of the procedure along with the potential associated complications, an informed witnessed consent was obtained. The risks of intracranial hemorrhage of 10%, worsening neurological deficit, ventilator dependency, death and inability to revascularize were all reviewed in detail with the patient's . The patient was then put under general anesthesia by the Department of Anesthesiology at [facility]. The right groin was prepped and draped in the usual sterile fashion. Thereafter using modified Seldinger technique, transfemoral access into the right common femoral artery was obtained without difficulty. Over a 0.035 inch  guidewire an 8 French 25 cm Pinnacle sheath was inserted. Through this, and also over a 0.035 inch guidewire a 5 [place] JB 1 catheter was advanced to the aortic arch region and selectively positioned in the left common carotid artery. An arteriogram was then performed centered extra cranially and intracranially. FINDINGS: The left common carotid arteriogram demonstrates the left external carotid artery and its major branches to be widely patent. The left internal carotid artery at the bulb to the cranial skull base also demonstrates wide patency. The petrous, the cavernous and the supraclinoid segments are widely patent. A small infundibulum is seen at the origin of the left posterior communicating artery. Also observed is the broad-based saccular outpouching along the posterior wall of the left internal carotid artery  opposite the origin of the ophthalmic artery. The left middle cerebral artery demonstrates patency of the left M1 segment. The superior division demonstrates a M2 branch occlusion. Additionally noted is occlusion of the left middle cerebral artery inferior division in the distal M3 region. The left anterior cerebral artery opacifies into the capillary and venous phases. PROCEDURE: The JB 1 catheter in the left common carotid artery was exchanged over a 0.035 inch 300 cm Rosen exchange guidewire for a [employer] 8 French 95 cm catheter which was positioned in the proximal 1/3 of the left internal carotid artery. The guidewire was removed. Good aspiration obtained from the hub of the TracStar guide catheter. A gentle control arteriogram performed through this demonstrated no evidence of spasms, dissections or of intraluminal filling defects. Over a 0.014 inch standard Synchro micro guidewire with a J configuration, a 132 cm 55 Zoom aspiration catheter with a 35 136 aspiration catheter combination was advanced to the supraclinoid left ICA. The micro guidewire was then gently advanced into the left  middle cerebral artery occluded superior division in the M2 segment. The guidewire was removed after advancement of the 35 aspiration catheter in the distal M2 M3 region. The guidewire was removed. Good aspiration obtained from the hub of the 35 Zoom aspiration catheter. Thereafter, with aspiration being applied at the hub of the 55 Zoom aspiration catheter, the TracStar aspiration catheter using a 60 mL syringe, and a 20 mL syringe at the hub of the 35 Zoom aspiration catheter for approximately 1-1/2 minutes, the combination of the 35 and 55 were retrieved and removed. Clots were seen within the aspirate, and also in the 35 Zoom aspiration catheter. Control arteriogram performed through the TracStar guide catheter in the distal left internal carotid artery demonstrated no significant change in the occluded superior division branch M2 segment. A second pass was then made in a similar fashion with 55 Zoom aspiration catheter, and the 35 Zoom aspiration catheter advanced into the distal M2 M3 segment of the superior division. The 55 aspiration catheter was engaged in the proximal occluded vessel. Aspiration applied for approximately 2 minutes at the 55 Zoom aspiration catheter with a Penumbra aspiration device, with a 20 mL syringe at the hub of the 35 Zoom aspiration catheter, and with a 20 mL syringe at the hub of the TracStar catheter. Combination was then retrieved and removed. Copious amounts of clot were seen within the canister. Also seen were bits of clot in the 55 aspiration catheter. A control arteriogram performed through the TracStar catheter in the left internal carotid artery now demonstrated complete revascularization of the superior division M2 segment. The M3 segment of the inferior division remained occluded. The 55 Zoom aspiration catheter was then advanced with an 021 Trevo ProVue microcatheter combination over a 0.014 inch micro guidewire with a J-tip configuration to the left middle cerebral  artery distally. The micro guidewire was then gently manipulated and advanced through the inferior division into the M3 region followed by the microcatheter. The microcatheter could not be advanced on account of severe tortuosity of the distal M3 occluded segment. However, the micro guidewire was advanced in a to and fro fashion through the occlusion multiple times in order to establish some distal flow. The micro guidewire was then retrieved and removed. A control arteriogram performed through the microcatheter in the inferior division now demonstrated slow advancement of contrast distal to this. The microcatheter and micro guidewire were retrieved and removed. A control arteriogram performed through the 55 aspiration catheter  in the cavernous left ICA demonstrated a TICI 2C revascularization of the left MCA distribution. The left posterior communicating artery remained patent. Slow recanalization was evident in the M3 region of the inferior division. However, also noted now was a filling defect in the anterior cerebral artery A2 A3 junction with slow flow distal to this. Proximal stenosis probably related to intracranial arteriosclerosis was evident. It was, therefore, decided not to attempt at aspiration at this site as slow recanalization was evident. A final control arteriogram performed through the TracStar guide sheath in the left common carotid artery demonstrated wide patency of the left internal carotid artery proximally and distally. The left middle cerebral artery continued to demonstrate a TICI 2c revascularization with mild to modestly improved flow through the distal inferior division M3 segment. Also noted was improved flow through the left anterior cerebral artery in the A2 A3 junction. A flat panel CT of the brain demonstrated no evidence of intracranial hemorrhage, mass effect or midline shift. No gross hypodensities were evident. The right groin 8 French sheath was removed with manual compression  applied for 30 minutes. Hemostasis was achieved. Distal pulses remained Dopplerable and unchanged in both feet. The patient was extubated without event. Upon recovery, the patient was able to move her left arm and leg spontaneously and also lift the right leg spontaneously. There continued be weakness of the right upper extremity. Also the patient demonstrated difficulty comprehending simple instructions. She was then transferred to the neuro ICU for post revascularization management. IMPRESSION: Status post endovascular revascularization of M2 superior division branch of the left middle cerebral artery, and partially of the left middle cerebral inferior division in the distal M3 region achieving a TICI 2c revascularization. Incidental note made of an approximately 3.5 mm wide neck saccular outpouching from the posterior wall of the left internal carotid artery at the level origin of the ophthalmic artery. PLAN: Follow-up in the clinic approximately 4 to 6 weeks post discharge. Electronically Signed   By: Julieanne Cotton M.D.   On: 05/04/2020 15:48   CT HEAD CODE STROKE WO CONTRAST  Addendum Date: 05/03/2020   ADDENDUM REPORT: 05/03/2020 14:38 ADDENDUM: Exam and technique should not include CT perfusion, which was performed subsequently and dictated separately. Only CT head without contrast and CT angiography of the head and neck with contrast performed. Electronically Signed   By: Guadlupe Spanish M.D.   On: 05/03/2020 14:38   Result Date: 05/03/2020 CLINICAL DATA:  Code stroke.  Right-sided weakness EXAM: CT HEAD WITHOUT CONTRAST CT ANGIOGRAPHY HEAD AND NECK CT PERFUSION BRAIN TECHNIQUE: Contiguous axial images were obtained the skull base to the vertex without contrast. Multidetector CT imaging of the head and neck was performed using the standard protocol during bolus administration of intravenous contrast. Multiplanar CT image reconstructions and MIPs were obtained to evaluate the vascular anatomy.  Carotid stenosis measurements (when applicable) are obtained utilizing NASCET criteria, using the distal internal carotid diameter as the denominator. Multiphase CT imaging of the brain was performed following IV bolus contrast injection. Subsequent parametric perfusion maps were calculated using RAPID software. CONTRAST:  75mL OMNIPAQUE IOHEXOL 350 MG/ML SOLN COMPARISON:  None. FINDINGS: CT HEAD Brain: There is no acute intracranial hemorrhage, mass effect, or edema. Gray-white differentiation is preserved. Patchy and confluent areas hypoattenuation in the supratentorial white but may reflect moderate chronic microvascular ischemic changes. There is no extra-axial fluid collection. Prominence of the ventricles and sulci reflects generalized parenchymal volume loss. Vascular: No hyperdense vessel. Skull: Calvarium is unremarkable. Sinuses/Orbits:  No acute finding. Other: None. ASPECTS ([place]berta Stroke Program Early CT Score) - Ganglionic level infarction (caudate, lentiform nuclei, internal capsule, insula, M1-M3 cortex): 7 - Supraganglionic infarction (M4-M6 cortex): 3 Total score (0-10 with 10 being normal): 10 CTA NECK Aortic arch: Great vessel origins are patent. Right carotid system: Patent. Mild calcified plaque at the ICA origin causing minimal stenosis. Left carotid system: Patent. Minimal calcified plaque at the proximal ICA without measurable stenosis. Vertebral arteries: Patent.  Right vertebral artery is dominant. Skeleton: Degenerative changes of the cervical spine primarily C6-C7. Other neck: Heterogeneous, multinodular thyroid. Further ultrasound follow-up is not recommended by current guidelines. Upper chest: Partially imaged small bilateral pleural effusions. Peribronchial and interstitial thickening likely reflecting interstitial edema. Review of the MIP images confirms the above findings CTA HEAD Anterior circulation: Intracranial internal carotid arteries are patent with mild calcified plaque.  Left M1 MCA is patent. There is occlusion of a M2 MCA branch approximately 13 mm from the MCA trifurcation (see saved key image series 11, 27). There is partial reconstitution followed by occlusion and subsequent distal M3 reconstitution. Right middle and both anterior cerebral arteries are patent. Posterior circulation: Intracranial vertebral arteries are patent with minimal calcified plaque on the right. Basilar artery is patent. Posterior cerebral arteries are patent. There are bilateral posterior communicating arteries present. Venous sinuses: Patent as allowed by contrast bolus timing. Review of the MIP images confirms the above findings IMPRESSION: No acute intracranial hemorrhage. No acute infarction identified. ASPECT score is 10. 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 No hemodynamically significant stenosis the neck. Partially imaged mild interstitial pulmonary edema and small bilateral pleural effusions. Results were called by telephone at the time of interpretation on 05/03/2020 at 1:09 pm and again at 1:17 p.m. to provider [facility] , who verbally acknowledged these results. Electronically Signed: By: Guadlupe Spanish M.D. On: 05/03/2020 13:32   VAS [place] UPPER EXTREMITY VENOUS DUPLEX  Result Date: 05/06/2020 UPPER VENOUS STUDY  Indications: Edema, and IV infiltration Limitations: Edema. Comparison Study: No prior study Performing Technologist: Sherren Kerns RVS  Examination Guidelines: A complete evaluation includes B-mode imaging, spectral Doppler, color Doppler, and power Doppler as needed of all accessible portions of each vessel. Bilateral testing is considered an integral part of a complete examination. Limited examinations for reoccurring indications may be performed as noted.  Right Findings: +----------+------------+---------+-----------+----------+-------+ RIGHT     CompressiblePhasicitySpontaneousPropertiesSummary  +----------+------------+---------+-----------+----------+-------+ IJV           Full                                          +----------+------------+---------+-----------+----------+-------+ Subclavian    Full       Yes       Yes                      +----------+------------+---------+-----------+----------+-------+ Axillary      Full       Yes       Yes                      +----------+------------+---------+-----------+----------+-------+ Brachial      Full       Yes       Yes                      +----------+------------+---------+-----------+----------+-------+ Cephalic  None                                   Acute  +----------+------------+---------+-----------+----------+-------+ Basilic       Full                                          +----------+------------+---------+-----------+----------+-------+  Left Findings: +----------+------------+---------+-----------+----------+-------+ LEFT      CompressiblePhasicitySpontaneousPropertiesSummary +----------+------------+---------+-----------+----------+-------+ Subclavian    Full       Yes       Yes                      +----------+------------+---------+-----------+----------+-------+  Summary:  Right: No evidence of deep vein thrombosis in the upper extremity. Findings consistent with acute superficial vein thrombosis involving the right cephalic vein.  Left: No evidence of thrombosis in the subclavian.  *See table(s) above for measurements and observations.  Diagnosing physician: Lemar Livings MD Electronically signed by Lemar Livings MD on 05/06/2020 at 3:30:01 PM.    Final        HISTORY OF PRESENT ILLNESS *** Coriann L Malenfant is a 60 y.o. 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  Please see excellent note by Dr. Wilford Corner for further details of her presentation.  In brief, she went to urgent care for evaluation of palpitations and was found to be confused after getting clonidine and labetalol to treat her A. fib with RVR.  Her initial code stroke evaluation was with NIH of 6 for mild right leg drift, right facial droop, not being able to answer orientation questions and mild aphasia as well as mild dysarthria.  CTA CTP demonstrated a left M3 cutoff point with 66 mL of tissue at risk.  The risk benefit ratio was discussed with the family given the disabling effects of aphasia and the relatively large area at risk decision was made to proceed with thrombectomy.    Post thrombectomy paralytic was reversed and patient was extubated.  She was noted to have much more marked right-sided weakness and aphasia, with a new left-sided gaze preference compared to prior to intervention, although post intervention scan was negative for any significant hemorrhage.  This was initially attributed to postanesthesia effect, but was persistent.  Her heart rates were noted to be in the 140s with oxygen saturations in the 90s, for which CCM was consulted.  LKW: 10 AM on 11/3 tPA given?: No, due to last dose of Xarelto10/3 AM  IA performed?: Yes Premorbid modified rankin scale:      0 - No symptoms.   HOSPITAL COURSE ***  DISCHARGE EXAM Blood pressure 119/74, pulse 75, temperature 97.7 F (36.5 C), temperature source Oral, resp. rate 18, height 5' 4.5" (1.638 m), weight 79.4 kg, SpO2 98 %. ***  Discharge Diet  Regular thin liquids  DISCHARGE PLAN  Disposition:  Transfer to [facility] for ongoing PT, OT and ST  {anticoagulants:31417} for secondary stroke prevention for 3 *** then *** alone.  Recommend ongoing stroke risk factor control by Primary Care Physician at time of discharge from inpatient rehabilitation.   Follow-up PCP Junie Spencer,  FNP in 2 weeks following discharge from rehab.  Follow-up in [facility] in 4 weeks following discharge from rehab, office to schedule an appointment.   *** minutes were spent preparing discharge.  ***

---

## 1983-07-02 HISTORY — PX: ABDOMINAL HYSTERECTOMY: SHX81

## 1999-09-14 ENCOUNTER — Emergency Department (HOSPITAL_COMMUNITY): Admission: EM | Admit: 1999-09-14 | Discharge: 1999-09-14 | Payer: Self-pay | Admitting: Emergency Medicine

## 2000-03-14 ENCOUNTER — Other Ambulatory Visit: Admission: RE | Admit: 2000-03-14 | Discharge: 2000-03-14 | Payer: Self-pay | Admitting: Family Medicine

## 2002-08-30 HISTORY — PX: FOOT SURGERY: SHX648

## 2005-12-20 ENCOUNTER — Ambulatory Visit (HOSPITAL_COMMUNITY): Admission: RE | Admit: 2005-12-20 | Discharge: 2005-12-20 | Payer: Self-pay | Admitting: Otolaryngology

## 2005-12-20 IMAGING — US US SOFT TISSUE HEAD/NECK
1 series · 14 of 25 positions shown · non-contrast
Comparison: none

CLINICAL DATA: 65 year old with thyroid nodule.
THYROID ULTRASOUND ? [DATE]:
TECHNIQUE: Ultrasound examination of the thyroid gland and adjacent soft tissue structures was performed.

[Series 1: unknown · 0.09mm/px · 14 of 34 slices shown]
[im 1/34]
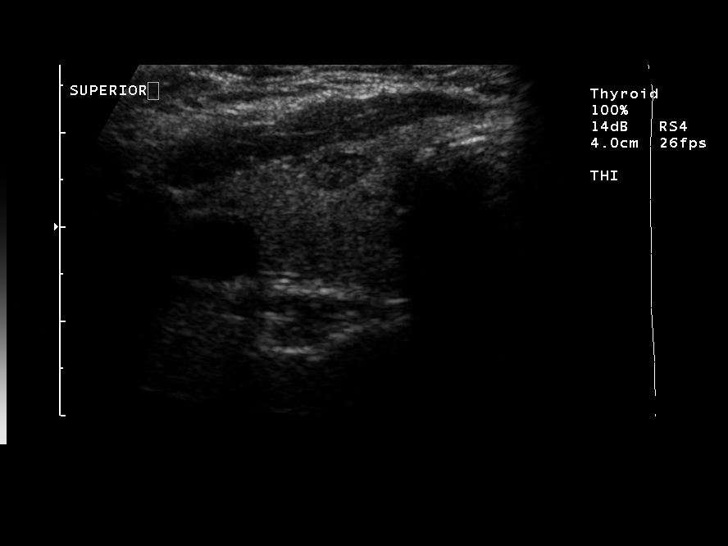
[im 3/34]
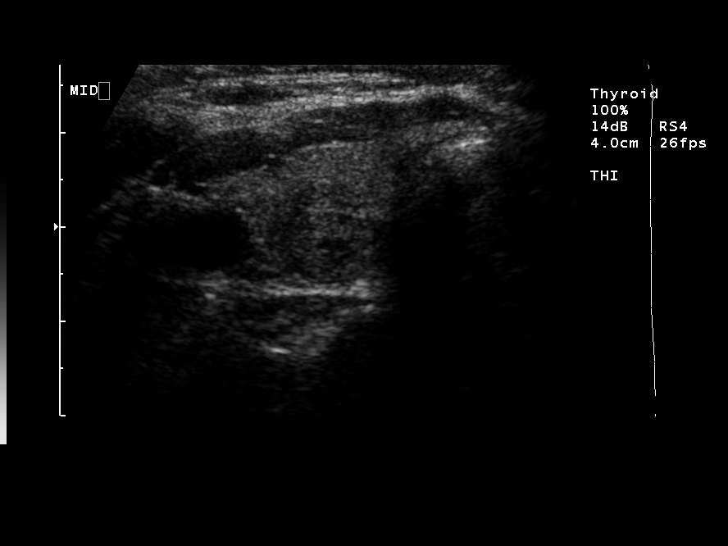
[im 6/34]
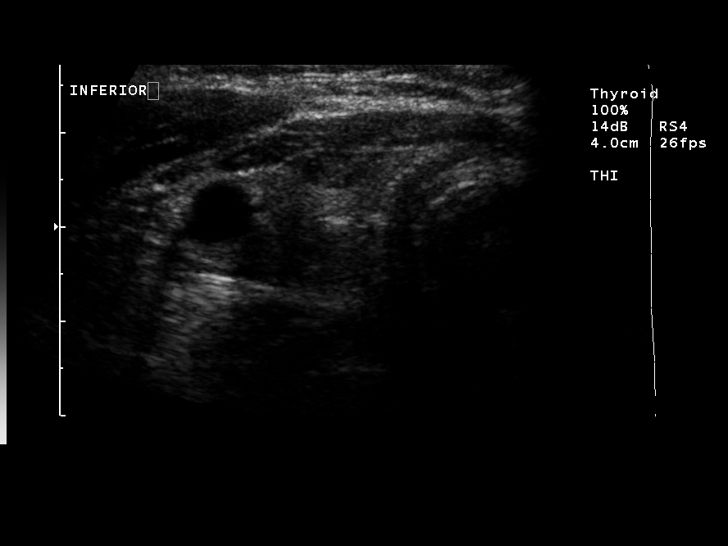
[im 9/34]
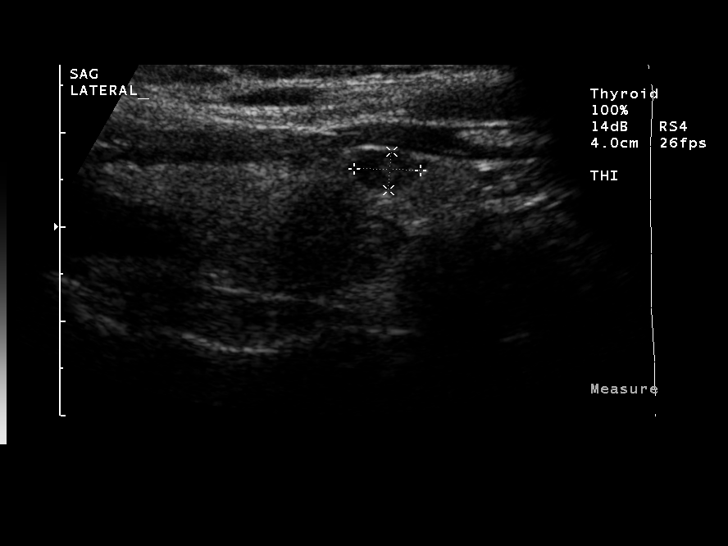
[im 12/34]
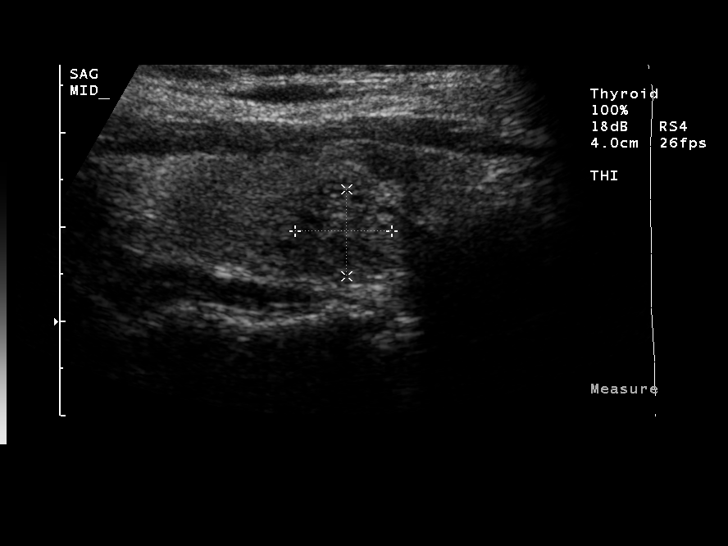
[im 13/34]
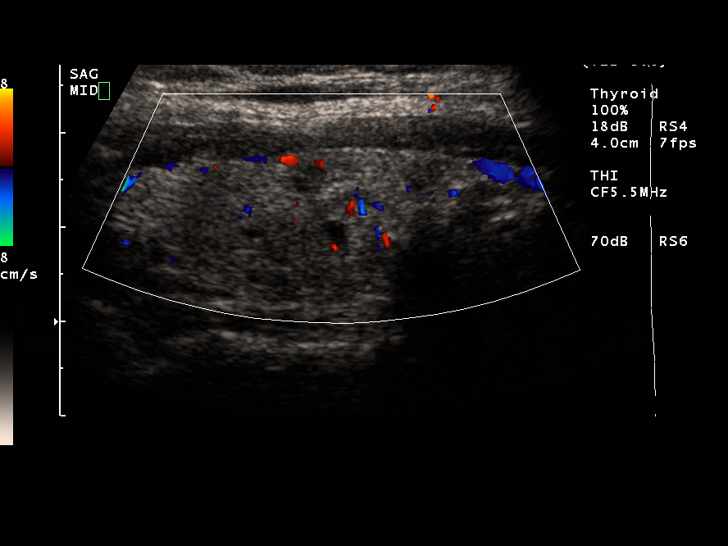
[im 16/34]
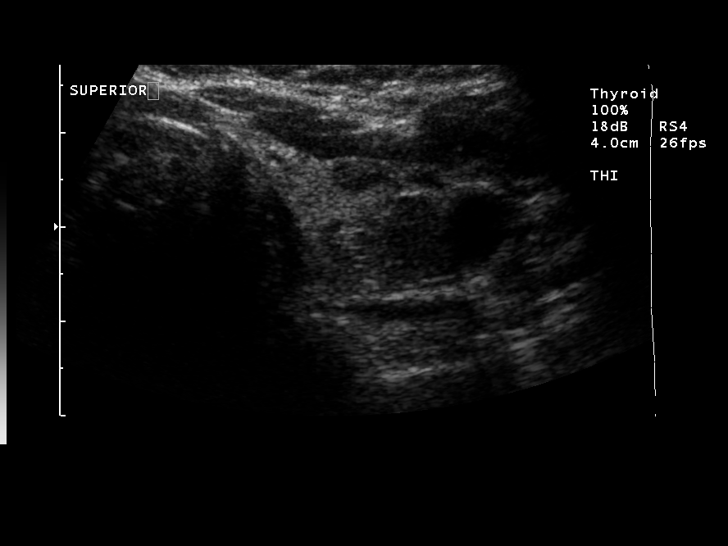
[im 18/34]
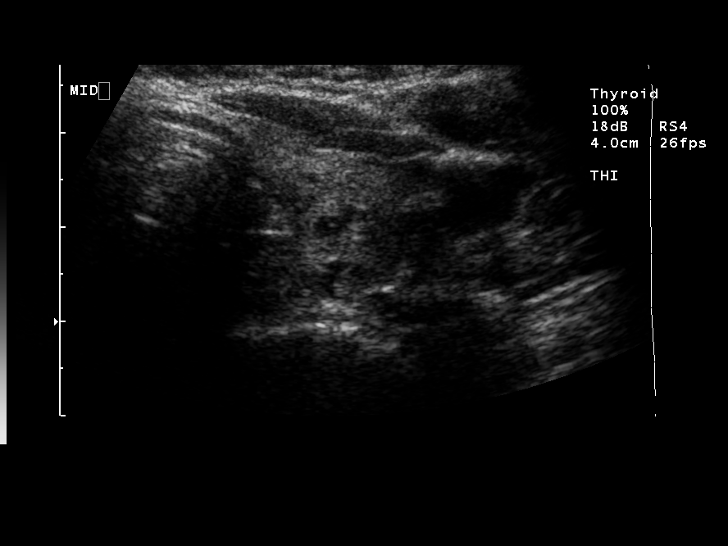
[im 21/34]
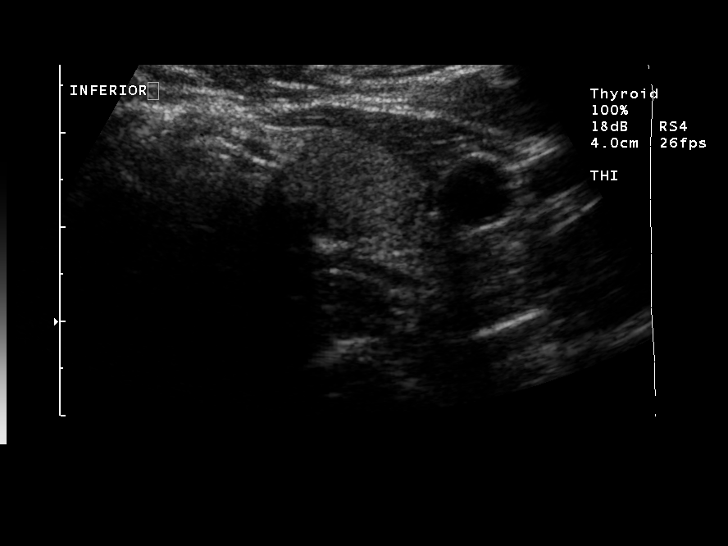
[im 23/34]
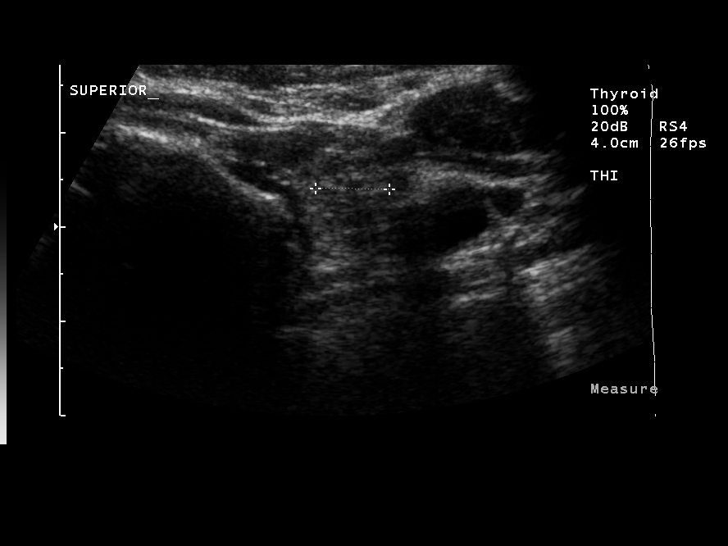
[im 25/34]
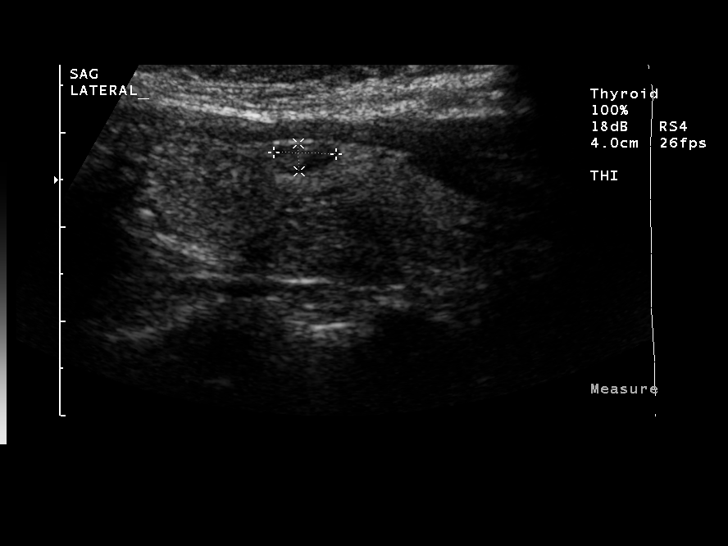
[im 28/34]
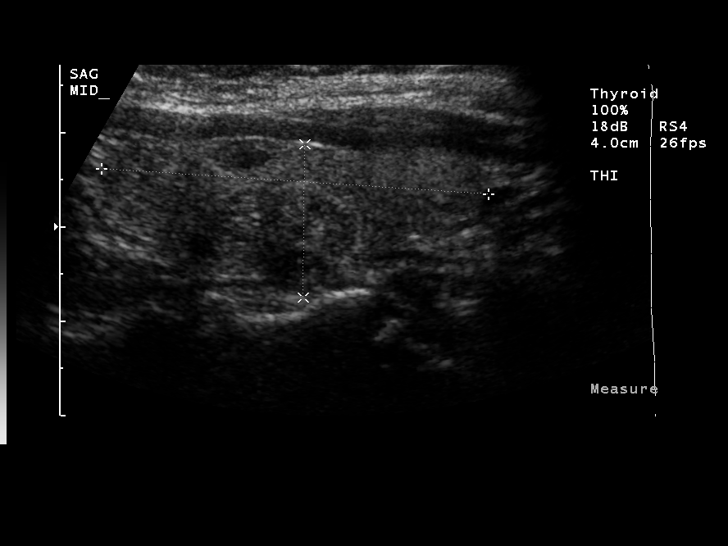
[im 31/34]
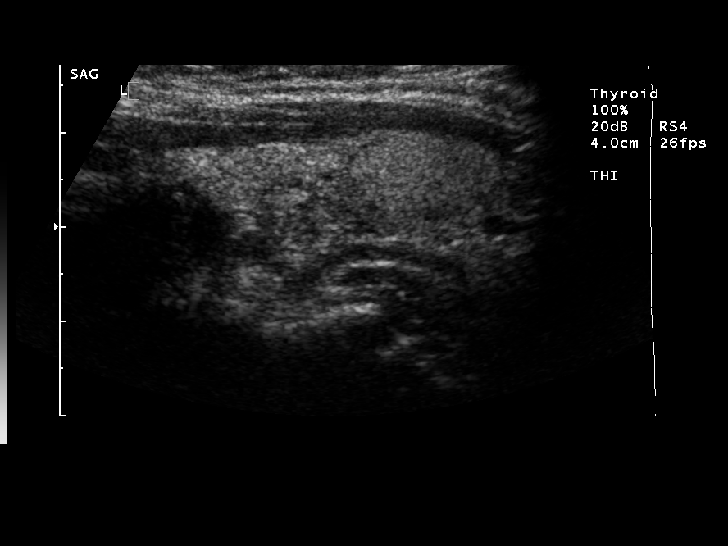
[im 34/34]
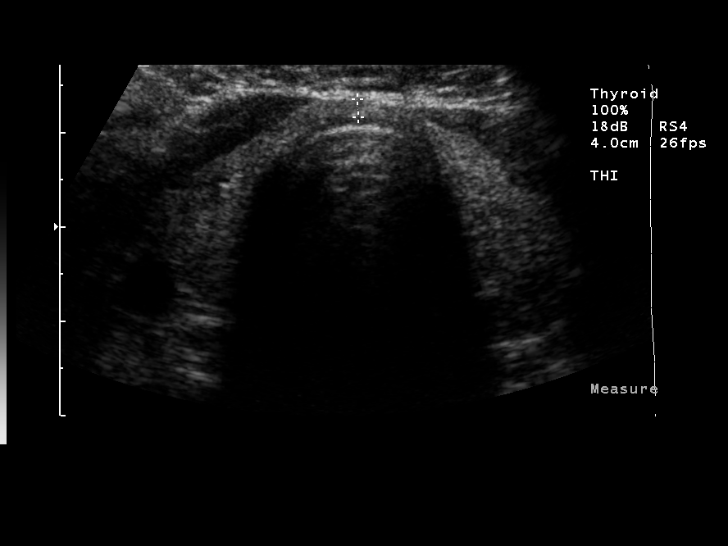

[14 of 25 positions shown; findings below may reference images not displayed]

FINDINGS: The right lobe of the thyroid is 3.8 x 1.5 x 1.7 cm.  The left lobe is 4.1 x 1.6 x 1.5 cm.  The isthmus measures 2 mm.    There are several small focal nodules throughout both lobes.  The largest on the left is 1.7 x 1.1 x 1.1 cm.  The largest on the right is 1.0 x 0.9 x 1.0 cm.
IMPRESSION: Findings are consistent with multinodular goiter.  The largest lesion is 1.7 cm on the left.

## 2006-08-27 ENCOUNTER — Encounter: Admission: RE | Admit: 2006-08-27 | Discharge: 2006-09-16 | Payer: Self-pay | Admitting: Orthopedic Surgery

## 2006-11-10 LAB — HM DEXA SCAN

## 2007-02-24 LAB — HM MAMMOGRAPHY

## 2010-11-07 ENCOUNTER — Ambulatory Visit (HOSPITAL_BASED_OUTPATIENT_CLINIC_OR_DEPARTMENT_OTHER)
Admission: RE | Admit: 2010-11-07 | Discharge: 2010-11-07 | Disposition: A | Discharge status: Home / Self Care | Payer: MEDICARE | Source: Ambulatory Visit | Attending: Orthopedic Surgery | Admitting: Orthopedic Surgery

## 2010-11-07 DIAGNOSIS — IMO0002 Reserved for concepts with insufficient information to code with codable children: Secondary | ICD-10-CM | POA: Insufficient documentation

## 2010-11-07 DIAGNOSIS — M25569 Pain in unspecified knee: Secondary | ICD-10-CM | POA: Insufficient documentation

## 2010-11-07 DIAGNOSIS — M2240 Chondromalacia patellae, unspecified knee: Secondary | ICD-10-CM | POA: Insufficient documentation

## 2010-11-07 DIAGNOSIS — Z01812 Encounter for preprocedural laboratory examination: Secondary | ICD-10-CM | POA: Insufficient documentation

## 2010-11-07 DIAGNOSIS — X58XXXA Exposure to other specified factors, initial encounter: Secondary | ICD-10-CM | POA: Insufficient documentation

## 2010-11-07 DIAGNOSIS — R9431 Abnormal electrocardiogram [ECG] [EKG]: Secondary | ICD-10-CM | POA: Insufficient documentation

## 2010-11-07 DIAGNOSIS — Z01810 Encounter for preprocedural cardiovascular examination: Secondary | ICD-10-CM | POA: Insufficient documentation

## 2010-11-07 LAB — POCT I-STAT 4, (NA,K, GLUC, HGB,HCT)
Glucose, Bld: 139 mg/dL — ABNORMAL HIGH (ref 70–99)
HCT: 47 % — ABNORMAL HIGH (ref 36.0–46.0)
Hemoglobin: 16 g/dL — ABNORMAL HIGH (ref 12.0–15.0)
Potassium: 3.6 mEq/L (ref 3.5–5.1)
Sodium: 141 mEq/L (ref 135–145)

## 2010-11-14 NOTE — Op Note (Signed)
NAME:  Sheila Ray, Sheila Ray NO.:  [id number]  MEDICAL RECORD NO.:  [id number]           PATIENT TYPE:  LOCATION:                                 FACILITY:  PHYSICIAN:  Ollen Gross, M.D.    DATE OF BIRTH:  07/21/1940  DATE OF PROCEDURE: DATE OF DISCHARGE:                              OPERATIVE REPORT   PREOPERATIVE DIAGNOSIS:  Right knee medial meniscal tear.  POSTOPERATIVE DIAGNOSIS:  Right knee medial meniscal tear plus chondral defect, medial femoral condyle.  PROCEDURE:  Right knee arthroscopy with medial meniscal debridement and chondroplasty.  SURGEON:  Ollen Gross, MD  ASSISTANT:  None.  ANESTHESIA:  General.  ESTIMATED BLOOD LOSS:  Minimal.  DRAIN:  None.  COMPLICATIONS:  None.  CONDITION:  Stable to Recovery.  BRIEF CLINICAL NOTE:  [name] is a 70 year old female with significant history of right knee pain and mechanical symptoms.  Exam and history suggested a medial meniscal tear confirmed by MRI.  She presents now for arthroscopy and debridement.  PROCEDURE IN DETAIL:  After successful administration of general anesthetic, a tourniquet was placed high on the right thigh and right lower extremity prepped and draped in the usual sterile fashion. Standard superomedial and inferolateral incisions were made.  Inflow cannula passed superomedial.  Camera passed inferolateral.  Arthroscopic visualization proceeds.  Undersurface of the patella and trochlea looked normal.  Medial and lateral gutters were visualized.  There were no loose bodies.  Flexion and valgus forces were applied to the knee.  The medial compartment was entered.  She had a real severe tear in the body and posterior horn of the medial meniscus with displacement of the tear. In addition, she has about 1 x 2 cm area on the medial femoral condyle with delamination of the cartilage.  A spinal needle was used to localize the inferomedial portal.  Small incision was made and  dilator placed.  Meniscus was debrided back to stable base with baskets and a 4.2 mm shaver and sealed off with the ArthroCare device.  The shaver was then used to debride the unstable cartilage from the surface of the medial femoral condyle.  Overall area was actually about 2 x 2 cm. There was an area that the bone was not completely exposed and the cartilage was very thin.  The rest of the medial femoral condyle looked fairly normal.  Medial tibial plateau looked normal.  Intercondylar notch was visualized.  The ACL was normal.  Lateral compartment was entered and looked normal.  Joints again inspected.  No other tears, defects, or loose bodies were noted.  The arthroscopic equipments were then removed from the inferior portals, which were closed with interrupted 4-0 nylon. 20 cc of 0.25% Marcaine with epi were injected through the inflow cannula that was removed and that portal was closed with nylon. Incisions were cleaned and dried and a bulky sterile dressing applied. She was then awakened and transported to Recovery in stable condition.     Ollen Gross, M.D.     FA/MEDQ  D:  11/07/2010  T:  11/07/2010  Job:  [id number]  Electronically Signed by Homero Fellers  Brandilyn Nanninga M.D. on 11/14/2010 10:12:57 AM

## 2011-02-05 ENCOUNTER — Ambulatory Visit: Payer: Medicare Other | Attending: Orthopedic Surgery | Admitting: Physical Therapy

## 2011-02-05 DIAGNOSIS — R269 Unspecified abnormalities of gait and mobility: Secondary | ICD-10-CM | POA: Insufficient documentation

## 2011-02-05 DIAGNOSIS — M25569 Pain in unspecified knee: Secondary | ICD-10-CM | POA: Insufficient documentation

## 2011-02-05 DIAGNOSIS — M25669 Stiffness of unspecified knee, not elsewhere classified: Secondary | ICD-10-CM | POA: Insufficient documentation

## 2011-02-05 DIAGNOSIS — IMO0001 Reserved for inherently not codable concepts without codable children: Secondary | ICD-10-CM | POA: Insufficient documentation

## 2011-02-07 ENCOUNTER — Encounter: Payer: Medicare Other | Admitting: Physical Therapy

## 2011-02-19 ENCOUNTER — Ambulatory Visit: Payer: Medicare Other | Admitting: Physical Therapy

## 2011-02-22 ENCOUNTER — Ambulatory Visit: Payer: Medicare Other | Admitting: Physical Therapy

## 2011-02-27 ENCOUNTER — Ambulatory Visit: Payer: Medicare Other | Admitting: Physical Therapy

## 2011-03-01 ENCOUNTER — Encounter: Payer: Medicare Other | Admitting: *Deleted

## 2011-07-02 HISTORY — PX: OTHER SURGICAL HISTORY: SHX169

## 2012-05-04 ENCOUNTER — Ambulatory Visit: Payer: Medicare Other | Admitting: Physical Therapy

## 2012-05-06 ENCOUNTER — Ambulatory Visit: Payer: Medicare Other | Admitting: Physical Therapy

## 2012-05-07 ENCOUNTER — Ambulatory Visit: Payer: Medicare Other | Attending: Clinical | Admitting: Physical Therapy

## 2012-05-07 DIAGNOSIS — IMO0001 Reserved for inherently not codable concepts without codable children: Secondary | ICD-10-CM | POA: Insufficient documentation

## 2012-05-07 DIAGNOSIS — M25519 Pain in unspecified shoulder: Secondary | ICD-10-CM | POA: Insufficient documentation

## 2012-05-07 DIAGNOSIS — R5381 Other malaise: Secondary | ICD-10-CM | POA: Insufficient documentation

## 2012-05-07 DIAGNOSIS — M25619 Stiffness of unspecified shoulder, not elsewhere classified: Secondary | ICD-10-CM | POA: Insufficient documentation

## 2012-05-11 ENCOUNTER — Ambulatory Visit: Payer: Medicare Other | Admitting: Physical Therapy

## 2012-05-14 ENCOUNTER — Ambulatory Visit: Payer: Medicare Other | Admitting: Physical Therapy

## 2012-05-20 ENCOUNTER — Ambulatory Visit: Payer: Medicare Other | Admitting: Physical Therapy

## 2012-06-04 ENCOUNTER — Ambulatory Visit: Payer: Medicare Other | Attending: Clinical | Admitting: Physical Therapy

## 2012-06-04 DIAGNOSIS — IMO0001 Reserved for inherently not codable concepts without codable children: Secondary | ICD-10-CM | POA: Insufficient documentation

## 2012-06-04 DIAGNOSIS — M25619 Stiffness of unspecified shoulder, not elsewhere classified: Secondary | ICD-10-CM | POA: Insufficient documentation

## 2012-06-04 DIAGNOSIS — R5381 Other malaise: Secondary | ICD-10-CM | POA: Insufficient documentation

## 2012-06-04 DIAGNOSIS — M25519 Pain in unspecified shoulder: Secondary | ICD-10-CM | POA: Insufficient documentation

## 2012-06-08 ENCOUNTER — Ambulatory Visit: Payer: Medicare Other | Admitting: *Deleted

## 2012-06-10 ENCOUNTER — Ambulatory Visit: Payer: Medicare Other | Admitting: *Deleted

## 2012-06-15 ENCOUNTER — Ambulatory Visit: Payer: Medicare Other | Admitting: *Deleted

## 2012-06-17 ENCOUNTER — Ambulatory Visit: Payer: Medicare Other | Admitting: *Deleted

## 2012-09-02 LAB — LIPID PANEL
Cholesterol: 204 mg/dL — AB (ref 0–200)
HDL: 36 mg/dL (ref 35–70)
LDL Cholesterol: 142 mg/dL
Triglycerides: 130 mg/dL (ref 40–160)

## 2012-09-02 LAB — HEMOGLOBIN A1C: Hgb A1c MFr Bld: 8.1 % — AB (ref 4.0–6.0)

## 2012-09-02 LAB — TSH: TSH: 1.57 u[IU]/mL (ref <=5.90)

## 2012-09-13 ENCOUNTER — Encounter: Payer: Self-pay | Admitting: Family Medicine

## 2012-09-13 DIAGNOSIS — E559 Vitamin D deficiency, unspecified: Secondary | ICD-10-CM

## 2012-09-13 DIAGNOSIS — E669 Obesity, unspecified: Secondary | ICD-10-CM

## 2012-09-13 DIAGNOSIS — E039 Hypothyroidism, unspecified: Secondary | ICD-10-CM

## 2012-09-13 DIAGNOSIS — I10 Essential (primary) hypertension: Secondary | ICD-10-CM

## 2012-09-13 DIAGNOSIS — E119 Type 2 diabetes mellitus without complications: Secondary | ICD-10-CM

## 2012-09-13 DIAGNOSIS — E785 Hyperlipidemia, unspecified: Secondary | ICD-10-CM | POA: Insufficient documentation

## 2012-09-14 ENCOUNTER — Other Ambulatory Visit: Payer: Self-pay | Admitting: *Deleted

## 2012-09-14 DIAGNOSIS — M810 Age-related osteoporosis without current pathological fracture: Secondary | ICD-10-CM

## 2012-09-14 DIAGNOSIS — M899 Disorder of bone, unspecified: Secondary | ICD-10-CM

## 2012-10-19 ENCOUNTER — Ambulatory Visit (INDEPENDENT_AMBULATORY_CARE_PROVIDER_SITE_OTHER): Payer: Medicare Other | Admitting: Pharmacist

## 2012-10-19 VITALS — BP 170/88 | HR 72 | Ht 65.0 in | Wt 195.0 lb

## 2012-10-19 DIAGNOSIS — E785 Hyperlipidemia, unspecified: Secondary | ICD-10-CM

## 2012-10-19 MED ORDER — GLUCOSE BLOOD VI STRP: ORAL_STRIP | Status: DC

## 2012-10-19 MED ORDER — ONETOUCH DELICA LANCETS 33G MISC: 1.0000 | Freq: Every day | Status: DC

## 2012-10-19 NOTE — Patient Instructions (Addendum)

## 2012-10-19 NOTE — Progress Notes (Signed)
Diabetes Flow Sheet:  Visit 1  Chief Complaint:   Chief Complaint  Patient presents with  . Diabetes    HPI:  Patient newly diagnosis with type 2 diabetes in March 2014. Started metformin 500mg  bid.  Tolerating well. Filed Vitals:   10/19/12 0826  BP: 170/88  Pulse: 72   Filed Weights   10/19/12 0826  Weight: 195 lb (88.451 kg)     Exam Edema:  Negative Polyuria:  positive  Polydipsia:  negatvie Polyphagia:  negative  BMI:  Body mass index is 32.45 kg/(m^2).   Weight changes:  decreased by 5 lbs. General Appearance:  obese Mood/Affect:  normal  Low fat/carbohydrate diet?  Yes - patient is eating more salads, reduced soft drink intake and eating more vegetable since diagnoses with diabetes but needs education regarding CHO counting Nicotine Abuse?  No Medication Compliance?  Yes Exercise?  No Alcohol Abuse?  No  Patient does not currently have a glucometer to check BG at home  Lab Results  Component Value Date   HGBA1C 8.1* 09/02/2012    Lab Results  Component Value Date   CHOL 204* 09/02/2012   HDL 36 09/02/2012   LDLCALC 161 09/02/2012   TRIG 130 09/02/2012     Medication Checklist: ACE Inhibitor/ARB?  Yes Lipid Lowering Agent?  Yes Aspirin?  Yes Oral Hypoglycemic Agent(s)?  Yes  Assessment: 1.  type 2 Diabetes.  Newly diagnosed in March 2014 2.  Blood Pressure Control.  BP elevated today - has not taken BP meds this am 3.  Lipid Control.  LDL not at goal - patient recently started pravastatin 10mg    Recommendations: 1.  1500 calorie, carbohydrate counting diet.  Patient is counseled extensively on carbohydrate counting, serving sizes, saturated fat intake and meal planning.  Patient is instructed to eat 3 meals a day and 3 small snacks.  Patient will supplement snacks based on physical activity. 2.  Increase physical activity.  Start with 10 minutes of physical activity daily. Suggested Silver [employer].  Patient is counseled to always carry glucose tablets,  lifesavers, hard candies, etc., while exercising in case of hypoglycemic event. 3.  Patient is counseled on pathophysiology of diabetes and the risk of long-term complications.  Fasting blood glucose goals are 80-120mg /dL.  Post-prandial goals are < 140.  A1C goals < 6.5%. 4.  LDL goal of < 100, HDL > 40 and TG < 150 (if tolerated a higher dose of pravastatin is recommended - labs due June 2014) ; BP goal < 140/80 5.  Patient is counseled on proper use of glucometer and lancing device.  Patient is instructed to check BG once daily at varying times.  Patient is given a One Touch Mini glucometer today and taught to use in office.  Rx is written for One Touch Test Strips to check BG once daily and for [facility] lancets both Rx for #100/2 RF. 6.  Medication recommendations at this time are as follows:  Continue metformin 500mg  1 tablet BID  RTC to see Dr. Modesto Charon - Thursday, April 24th - patient is instructed to take BP medication morning of visit as usual.  If BP still elevated may need to increase lisinipril or add a diuretic (HCTZ or chlorthalidone) Due recheck labs June 2014.  RTC to see [facility] in 4 weeks.   Time spent counseling patient:  50 minutes    Henrene Pastor, PharmD, CPP

## 2012-10-22 ENCOUNTER — Encounter: Payer: Self-pay | Admitting: Family Medicine

## 2012-10-22 ENCOUNTER — Ambulatory Visit (INDEPENDENT_AMBULATORY_CARE_PROVIDER_SITE_OTHER): Payer: Medicare Other | Admitting: Family Medicine

## 2012-10-22 VITALS — BP 160/82 | HR 65 | Temp 97.9°F | Ht 63.0 in | Wt 193.8 lb

## 2012-10-22 DIAGNOSIS — E119 Type 2 diabetes mellitus without complications: Secondary | ICD-10-CM

## 2012-10-22 DIAGNOSIS — E1149 Type 2 diabetes mellitus with other diabetic neurological complication: Secondary | ICD-10-CM | POA: Insufficient documentation

## 2012-10-22 DIAGNOSIS — E559 Vitamin D deficiency, unspecified: Secondary | ICD-10-CM

## 2012-10-22 DIAGNOSIS — E1159 Type 2 diabetes mellitus with other circulatory complications: Secondary | ICD-10-CM | POA: Insufficient documentation

## 2012-10-22 DIAGNOSIS — I10 Essential (primary) hypertension: Secondary | ICD-10-CM

## 2012-10-22 DIAGNOSIS — I152 Hypertension secondary to endocrine disorders: Secondary | ICD-10-CM | POA: Insufficient documentation

## 2012-10-22 DIAGNOSIS — E1169 Type 2 diabetes mellitus with other specified complication: Secondary | ICD-10-CM | POA: Insufficient documentation

## 2012-10-22 DIAGNOSIS — E785 Hyperlipidemia, unspecified: Secondary | ICD-10-CM

## 2012-10-22 MED ORDER — LISINOPRIL 20 MG PO TABS: 10.0000 mg | ORAL_TABLET | Freq: Every day | ORAL | Status: DC

## 2012-10-22 MED ORDER — METFORMIN HCL 1000 MG PO TABS: 1000.0000 mg | ORAL_TABLET | Freq: Two times a day (BID) | ORAL | Status: DC

## 2012-10-22 NOTE — Patient Instructions (Addendum)
Dr Woodroe Mode Recommendations  Diet and Exercise discussed with patient.  For nutrition information, I recommend books:  1).Eat to Live by Dr Monico Hoar. 2).Prevent and Reverse Heart Disease by Dr Suzzette Righter.  Exercise recommendations are:  If unable to walk, then the patient can exercise in a chair 3 times a day. By flapping arms like a bird gently and raising legs outwards to the front.  If ambulatory, the patient can go for walks for 30 minutes 3 times a week. Then increase the intensity and duration as tolerated.  Goal is to try to attain exercise frequency to 5 times a week.  If applicable: Best to perform resistance exercises (machines or weights) 2 days a week and cardio type exercises 3 days per week.   Diabetes and Exercise Regular exercise is important and can help:   Control blood glucose (sugar).  Decrease blood pressure.    Control blood lipids (cholesterol, triglycerides).  Improve overall health. BENEFITS FROM EXERCISE  Improved fitness.  Improved flexibility.  Improved endurance.  Increased bone density.  Weight control.  Increased muscle strength.  Decreased body fat.  Improvement of the body's use of insulin, a hormone.  Increased insulin sensitivity.  Reduction of insulin needs.  Reduced stress and tension.  Helps you feel better. People with diabetes who add exercise to their lifestyle gain additional benefits, including:  Weight loss.  Reduced appetite.  Improvement of the body's use of blood glucose.  Decreased risk factors for heart disease:  Lowering of cholesterol and triglycerides.  Raising the level of good cholesterol (high-density lipoproteins, HDL).  Lowering blood sugar.  Decreased blood pressure. TYPE 1 DIABETES AND EXERCISE  Exercise will usually lower your blood glucose.  If blood glucose is greater than 240 mg/dl, check urine ketones. If ketones are present, do not exercise.  Location  of the insulin injection sites may need to be adjusted with exercise. Avoid injecting insulin into areas of the body that will be exercised. For example, avoid injecting insulin into:  The arms when playing tennis.  The legs when jogging. For more information, discuss this with your caregiver.  Keep a record of:  Food intake.  Type and amount of exercise.  Expected peak times of insulin action.  Blood glucose levels. Do this before, during, and after exercise. Review your records with your caregiver. This will help you to develop guidelines for adjusting food intake and insulin amounts.  TYPE 2 DIABETES AND EXERCISE  Regular physical activity can help control blood glucose.  Exercise is important because it may:  Increase the body's sensitivity to insulin.  Improve blood glucose control.  Exercise reduces the risk of heart disease. It decreases serum cholesterol and triglycerides. It also lowers blood pressure.  Those who take insulin or oral hypoglycemic agents should watch for signs of hypoglycemia. These signs include dizziness, shaking, sweating, chills, and confusion.  Body water is lost during exercise. It must be replaced. This will help to avoid loss of body fluids (dehydration) or heat stroke. Be sure to talk to your caregiver before starting an exercise program to make sure it is safe for you. Remember, any activity is better than none.  Document Released: 09/07/2003 Document Revised: 09/09/2011 Document Reviewed: 12/22/2008 [facility] Patient Information 2013 [place], [place].   Diabetes, Type 2 Diabetes is a long-lasting (chronic) disease. In type 2 diabetes, the pancreas does not make enough insulin (a hormone), and the body does not respond normally to the insulin that is  made. This type of diabetes was also previously called adult-onset diabetes. It usually occurs after the age of 40, but it can occur at any age.  CAUSES  Type 2 diabetes happens because the  pancreasis not making enough insulin or your body has trouble using the insulin that your pancreas does make properly. SYMPTOMS   Drinking more than usual.  Urinating more than usual.  Blurred vision.  Dry, itchy skin.  Frequent infections.  Feeling more tired than usual (fatigue). DIAGNOSIS The diagnosis of type 2 diabetes is usually made by one of the following tests:  Fasting blood glucose test. You will not eat for at least 8 hours and then take a blood test.  Random blood glucose test. Your blood glucose (sugar) is checked at any time of the day regardless of when you ate.  Oral glucose tolerance test (OGTT). Your blood glucose is measured after you have not eaten (fasted) and then after you drink a glucose containing beverage. TREATMENT   Healthy eating.  Exercise.  Medicine, if needed.  Monitoring blood glucose.  Seeing your caregiver regularly. HOME CARE INSTRUCTIONS   Check your blood glucose at least once a day. More frequent monitoring may be necessary, depending on your medicines and on how well your diabetes is controlled. Your caregiver will advise you.  Take your medicine as directed by your caregiver.  Do not smoke.  Make wise food choices. Ask your caregiver for information. Weight loss can improve your diabetes.  Learn about low blood glucose (hypoglycemia) and how to treat it.  Get your eyes checked regularly.  Have a yearly physical exam. Have your blood pressure checked and your blood and urine tested.  Wear a pendant or bracelet saying that you have diabetes.  Check your feet every night for cuts, sores, blisters, and redness. Let your caregiver know if you have any problems. SEEK MEDICAL CARE IF:   You have problems keeping your blood glucose in target range.  You have problems with your medicines.  You have symptoms of an illness that do not improve after 24 hours.  You have a sore or wound that is not healing.  You notice a  change in vision or a new problem with your vision.  You have a fever. MAKE SURE YOU:  Understand these instructions.  Will watch your condition.  Will get help right away if you are not doing well or get worse. Document Released: 06/17/2005 Document Revised: 09/09/2011 Document Reviewed: 12/03/2010 [facility] Patient Information 2013 [place], [place].  Hypertension As your heart beats, it forces blood through your arteries. This force is your blood pressure. If the pressure is too high, it is called hypertension (HTN) or high blood pressure. HTN is dangerous because you may have it and not know it. High blood pressure may mean that your heart has to work harder to pump blood. Your arteries may be narrow or stiff. The extra work puts you at risk for heart disease, stroke, and other problems.  Blood pressure consists of two numbers, a higher number over a lower, 110/72, for example. It is stated as "110 over 72." The ideal is below 120 for the top number (systolic) and under 80 for the bottom (diastolic). Write down your blood pressure today. You should pay close attention to your blood pressure if you have certain conditions such as:  Heart failure.  Prior heart attack.  Diabetes  Chronic kidney disease.  Prior stroke.  Multiple risk factors for heart disease. To see if  you have HTN, your blood pressure should be measured while you are seated with your arm held at the level of the heart. It should be measured at least twice. A one-time elevated blood pressure reading (especially in the Emergency Department) does not mean that you need treatment. There may be conditions in which the blood pressure is different between your right and left arms. It is important to see your caregiver soon for a recheck. Most people have essential hypertension which means that there is not a specific cause. This type of high blood pressure may be lowered by changing lifestyle factors such  as:  Stress.  Smoking.  Lack of exercise.  Excessive weight.  Drug/tobacco/alcohol use.  Eating less salt. Most people do not have symptoms from high blood pressure until it has caused damage to the body. Effective treatment can often prevent, delay or reduce that damage. TREATMENT  When a cause has been identified, treatment for high blood pressure is directed at the cause. There are a large number of medications to treat HTN. These fall into several categories, and your caregiver will help you select the medicines that are best for you. Medications may have side effects. You should review side effects with your caregiver. If your blood pressure stays high after you have made lifestyle changes or started on medicines,   Your medication(s) may need to be changed.  Other problems may need to be addressed.  Be certain you understand your prescriptions, and know how and when to take your medicine.  Be sure to follow up with your caregiver within the time frame advised (usually within two weeks) to have your blood pressure rechecked and to review your medications.  If you are taking more than one medicine to lower your blood pressure, make sure you know how and at what times they should be taken. Taking two medicines at the same time can result in blood pressure that is too low. SEEK IMMEDIATE MEDICAL CARE IF:  You develop a severe headache, blurred or changing vision, or confusion.  You have unusual weakness or numbness, or a faint feeling.  You have severe chest or abdominal pain, vomiting, or breathing problems. MAKE SURE YOU:   Understand these instructions.  Will watch your condition.  Will get help right away if you are not doing well or get worse. Document Released: 06/17/2005 Document Revised: 09/09/2011 Document Reviewed: 02/05/2008 [facility] Patient Information 2013 [place], [place].

## 2012-10-22 NOTE — Progress Notes (Signed)
Patient ID: Sheila Ray, female   DOB: [birth year], 72 y.o.   MRN: [id number] SUBJECTIVE: HPI: Patient is here for follow up of Diabetes Mellitus.Symptoms of DM:has had no Nocturia ,deniesUrinary Frequency ,denies Blurred vision ,deniesDizziness,denies.Dysuria,deniesparesthesias, deniesextremity pain or ulcers.Marland Kitchendenieschest pain. .has hadan annual eye exam. do check the feet. doescheck CBGs. Average CBG:_121 to 134_______.Marland Kitchen deniesto episodes of hypoglycemia. doeshave an emergency hypoglycemic plan. admits toCompliance with medications. deniesProblems with medications.   PMH/PSH: reviewed/updated in Epic  SH/FH: reviewed/updated in Epic  Allergies: reviewed/updated in Epic  Medications: reviewed/updated in Epic  Immunizations: reviewed/updated in Epic  ROS: As above in the HPI. All other systems are stable or negative.  OBJECTIVE: APPEARANCE: white female Patient in no acute distress.The patient appeared well nourished and normally developed. Acyanotic. Waist:central obesity VITAL SIGNS:BP 160/82  Pulse 65  Temp(Src) 97.9 F (36.6 C) (Oral)  Ht 5\' 3"  (1.6 m)  Wt 193 lb 12.8 oz (87.907 kg)  BMI 34.34 kg/m2   SKIN: warm and  Dry without overt rashes, tattoos and scars  HEAD and Neck: without JVD, Head and scalp: normal Eyes:No scleral icterus. Fundi normal, eye movements normal. Ears: Auricle normal, canal normal, Tympanic membranes normal, insufflation normal. Nose: normal Throat: normal Neck & thyroid: normal  CHEST & LUNGS: Chest wall: normal Lungs: Clear  CVS: Reveals the PMI to be normally located. Regular rhythm, First and Second Heart sounds are normal,  absence of murmurs, rubs or gallops. Peripheral vasculature: Radial pulses: normal Dorsal pedis pulses: normal Posterior pulses: normal  ABDOMEN:  Appearance: normal Benign,, no organomegaly, no masses, no Abdominal Aortic enlargement. No Guarding , no rebound. No Bruits. Bowel sounds:  normal  RECTAL: N/A GU: N/A  EXTREMETIES: nonedematous. Both Femoral and Pedal pulses are normal.  MUSCULOSKELETAL:  Spine: normal Joints: intact  NEUROLOGIC: oriented to time,place and person; nonfocal. Strength is normal Sensory is normal Reflexes are normal Cranial Nerves are normal.  ASSESSMENT: Unspecified vitamin D deficiency  DM (diabetes mellitus) - Plan: metFORMIN (GLUCOPHAGE) 1000 MG tablet  HTN (hypertension) - Plan: lisinopril (PRINIVIL,ZESTRIL) 20 MG tablet  HLD (hyperlipidemia)   PLAN: No orders of the defined types were placed in this encounter.   Meds ordered this encounter  Medications  . aspirin 81 MG tablet    Sig: Take 81 mg by mouth daily.  . metFORMIN (GLUCOPHAGE) 1000 MG tablet    Sig: Take 1 tablet (1,000 mg total) by mouth 2 (two) times daily with a meal.    Dispense:  60 tablet    Refill:  5  . lisinopril (PRINIVIL,ZESTRIL) 20 MG tablet    Sig: Take 0.5 tablets (10 mg total) by mouth daily.    Dispense:  30 tablet    Refill:  5   Increased diabetes meds and antihypertensive meds. To reach targets..       Dr Woodroe Mode Recommendations  Diet and Exercise discussed with patient.  For nutrition information, I recommend books:  1).Eat to Live by Dr Monico Hoar. 2).Prevent and Reverse Heart Disease by Dr Suzzette Righter.  Exercise recommendations are:  If unable to walk, then the patient can exercise in a chair 3 times a day. By flapping arms like a bird gently and raising legs outwards to the front.  If ambulatory, the patient can go for walks for 30 minutes 3 times a week. Then increase the intensity and duration as tolerated.  Goal is to try to attain exercise frequency to 5 times a week.  If applicable: Best to perform resistance  exercises (machines or weights) 2 days a week and cardio type exercises 3 days per week.  RTC 2 months.  Weslee Fogg P. Modesto Charon, M.D.

## 2012-10-29 ENCOUNTER — Telehealth: Payer: Self-pay | Admitting: Family Medicine

## 2012-10-29 NOTE — Telephone Encounter (Signed)
appt made for mon- pt wanted to wait for wong

## 2012-11-02 ENCOUNTER — Ambulatory Visit (INDEPENDENT_AMBULATORY_CARE_PROVIDER_SITE_OTHER): Payer: Medicare Other | Admitting: Family Medicine

## 2012-11-02 ENCOUNTER — Encounter: Payer: Self-pay | Admitting: Family Medicine

## 2012-11-02 VITALS — BP 157/75 | HR 76 | Temp 97.8°F | Ht 64.5 in | Wt 191.6 lb

## 2012-11-02 DIAGNOSIS — I10 Essential (primary) hypertension: Secondary | ICD-10-CM

## 2012-11-02 DIAGNOSIS — E119 Type 2 diabetes mellitus without complications: Secondary | ICD-10-CM

## 2012-11-02 DIAGNOSIS — B354 Tinea corporis: Secondary | ICD-10-CM

## 2012-11-02 MED ORDER — KETOCONAZOLE 2 % EX CREA: TOPICAL_CREAM | Freq: Every day | CUTANEOUS | Status: DC

## 2012-11-02 NOTE — Patient Instructions (Signed)
Fungus Infection of the Skin  An infection of your skin caused by a fungus is a very common problem. Treatment depends on which part of the body is affected. Types of fungal skin infection include:  · Athlete's Foot(Tinea pedis). This infection starts between the toes and may involve the entire sole and sides of foot. It is the most common fungal disease. It is made worse by heat, moisture, and friction. To treat, wash your feet 2 to 3 times daily. Dry thoroughly between the toes. Use medicated foot powder or cream as directed on the package. Plain talc, cornstarch, or rice powder may be dusted into socks and shoes to keep the feet dry. Wearing footwear that allows ventilation is also helpful.  · Ringworm (Tinea corporis and tinea capitis). This infection causes scaly red rings to form on the skin or scalp. For skin sores, apply medicated lotion or cream as directed on the package. For the scalp, medicated shampoo may be used with with other therapies. Ringworm of the scalp or fingernails usually requires using oral medicine for 2 to 4 months.  · Tinea versicolor. This infection appears as painless, scaly, patchy areas of discolored skin (whitish to light brown). It is more common in the summer and favors oily areas of the skin such as those found at the chest, abdomen, back, pubis, neck, and body folds. It can be treated with medicated shampoo or with medicated topical cream. Oral antifungals may be needed for more active infections. The light and/or dark spots may take time to get better and is not a sign of treatment failure.  Fungal infections may need to be treated for several weeks to be cured. It is important not to treat fungal infections with steroids or combination medicine that contains an antifungal and steroid as these will make the fungal infection worse.  SEEK MEDICAL CARE IF:   · You have persistent itching or rawness.  · You have an oral temperature above 102° F (38.9° C).  Document Released:  07/25/2004 Document Revised: 09/09/2011 Document Reviewed: 10/10/2009  Exit[employer]® Patient Information ©2013 [employer].

## 2012-11-02 NOTE — Progress Notes (Addendum)
Subjective:    Patient ID: Sheila Ray, female    DOB: December 05, 1940, 72 y.o.   MRN: [id number]  HPI .Rash Patient presents for evaluation of a rash involving the chest. Rash started a few days ago. Lesions are pink, and raised in texture. Rash has changed over time. Rash is pruritic. Associated symptoms: none. Patient denies: abdominal pain, arthralgia, congestion, cough, crankiness, decrease in appetite, decrease in energy level, fever, headache, irritability, myalgia, nausea, sore throat and vomiting. Patient has not had contacts with similar rash. Patient has not had new exposures (soaps, lotions, laundry detergents, foods, medications, plants, insects or animals). Patient with known DM. Recent visit see that documentation. Sugars are improving with dietary changes.  Past Medical History  Diagnosis Date  . Hypothyroid   . Hypertension   . Goiter   . Obese   . Osteopenia   . Fatigue   . Gastric polyp   . Diverticulosis   . Rosacea   . Postmenopausal   . Vitamin D deficiency   . Hyperlipidemia   . Diabetes mellitus without complication    Past Surgical History  Procedure Laterality Date  . Abdominal hysterectomy  1985  . Foot surgery Right 08/2002   History   Social History  . Marital Status: Divorced    Spouse Name: N/A    Number of Children: N/A  . Years of Education: N/A   Occupational History  . Not on file.   Social History Main Topics  . Smoking status: Never Smoker   . Smokeless tobacco: Not on file  . Alcohol Use: No  . Drug Use: No  . Sexually Active: Not on file   Other Topics Concern  . Not on file   Social History Narrative  . No narrative on file   Family History  Problem Relation Age of Onset  . Osteoporosis Mother    Current Outpatient Prescriptions on File Prior to Visit  Medication Sig Dispense Refill  . amLODipine (NORVASC) 5 MG tablet Take 5 mg by mouth daily.      Marland Kitchen aspirin 81 MG tablet Take 81 mg by mouth daily.      .  ergocalciferol (VITAMIN D2) 50000 UNITS capsule Take 50,000 Units by mouth once a week.      Marland Kitchen glucose blood (ONE TOUCH ULTRA TEST) test strip Use to check BG daily Dx:250.02  100 each  2  . lisinopril (PRINIVIL,ZESTRIL) 20 MG tablet Take 0.5 tablets (10 mg total) by mouth daily.  30 tablet  5  . metFORMIN (GLUCOPHAGE) 1000 MG tablet Take 1 tablet (1,000 mg total) by mouth 2 (two) times daily with a meal.  60 tablet  5  . ONETOUCH DELICA LANCETS 33G MISC 1 each by Does not apply route daily. Use to check BG daily Dx:  250.02  100 each  2  . pravastatin (PRAVACHOL) 10 MG tablet Take 10 mg by mouth at bedtime.       No current facility-administered medications on file prior to visit.   Allergies  Allergen Reactions  . Crestor (Rosuvastatin)     Cramps  . Lipitor (Atorvastatin)     Cramps   Immunization History  Administered Date(s) Administered  . Influenza Whole 05/16/2012  . Pneumococcal Polysaccharide 11/29/2005  . Td 09/14/2002  . Zoster 04/17/2011   Prior to Admission medications   Medication Sig Start Date End Date Taking? Authorizing Provider  amLODipine (NORVASC) 5 MG tablet Take 5 mg by mouth daily.   Yes Historical Provider, MD  aspirin 81 MG tablet Take 81 mg by mouth daily.   Yes Historical Provider, MD  ergocalciferol (VITAMIN D2) 50000 UNITS capsule Take 50,000 Units by mouth once a week. 09/10/12 12/06/12 Yes Historical Provider, MD  glucose blood (ONE TOUCH ULTRA TEST) test strip Use to check BG daily Dx:250.02 10/19/12  Yes Tammy Eckard, PHARMD  lisinopril (PRINIVIL,ZESTRIL) 20 MG tablet Take 0.5 tablets (10 mg total) by mouth daily. 10/22/12  Yes Ileana Ladd, MD  metFORMIN (GLUCOPHAGE) 1000 MG tablet Take 1 tablet (1,000 mg total) by mouth 2 (two) times daily with a meal. 10/22/12  Yes Ileana Ladd, MD  [facility] DELICA LANCETS 33G MISC 1 each by Does not apply route daily. Use to check BG daily Dx:  250.02 10/19/12  Yes Tammy Eckard, PHARMD  pravastatin (PRAVACHOL)  10 MG tablet Take 10 mg by mouth at bedtime.   Yes Historical Provider, MD      Review of Systems  Skin: Positive for rash (itchy red area on left side chest and abd ).  Allergic/Immunologic: Positive for environmental allergies (seasonal).   Rest of ROS is negative    Objective:   Physical Exam  APPEARANCE: Sheila Ray WF Patient in no acute distress.The patient appeared well nourished and normally developed. Acyanotic. Waist: VITAL SIGNS:BP 157/75  Pulse 76  Temp(Src) 97.8 F (36.6 C) (Oral)  Ht 5' 4.5" (1.638 m)  Wt 191 lb 9.6 oz (86.909 kg)  BMI 32.39 kg/m2   SKIN: warm and  Dry without  tattoos and scars . Reddish pink serpinginous rash on left breast and left Iliac area. Each approximately 3 inches diameter with raised margins and scales  HEAD and Neck: without JVD, Head and scalp: normal Eyes:No scleral icterus. Fundi normal, eye movements normal. Ears: Auricle normal, canal normal, Tympanic membranes normal, insufflation normal. Nose: normal Throat: normal Neck & thyroid: normal  CHEST & LUNGS: Chest wall: normal Lungs: Clear  CVS: Reveals the PMI to be normally located. Regular rhythm, First and Second Heart sounds are normal,  absence of murmurs, rubs or gallops. Peripheral vasculature: Radial pulses: normal Dorsal pedis pulses: normal Posterior pulses: normal  ABDOMEN:  Appearance: normal Benign,, no organomegaly, no masses, no Abdominal Aortic enlargement. No Guarding , no rebound. No Bruits. Bowel sounds: normal   NEUROLOGIC: oriented to time,place and person; nonfocal.      Assessment & Plan:  Sheila Ray was seen today for rash.  Diagnoses and associated orders for this visit:  Tinea corporis - ketoconazole (NIZORAL) 2 % cream; Apply topically daily.  Type II or unspecified type diabetes mellitus without mention of complication, not stated as uncontrolled  Unspecified essential hypertension   Patient  To continue with the dietary changes  and  Exercise that she has embarked on to improve health and BP and  DM, which she reports is better. Skin Care The NIzoral Cream was a 60 gm tube.to apply for 2 weeks. Keep routine follow up. Francis P. Modesto Charon, M.D.

## 2012-11-19 ENCOUNTER — Encounter: Payer: Self-pay | Admitting: Pharmacist

## 2012-11-19 ENCOUNTER — Ambulatory Visit (INDEPENDENT_AMBULATORY_CARE_PROVIDER_SITE_OTHER): Payer: Medicare Other | Admitting: Pharmacist

## 2012-11-19 VITALS — BP 146/78 | HR 70 | Ht 65.0 in | Wt 190.0 lb

## 2012-11-19 DIAGNOSIS — E119 Type 2 diabetes mellitus without complications: Secondary | ICD-10-CM

## 2012-11-19 DIAGNOSIS — I10 Essential (primary) hypertension: Secondary | ICD-10-CM

## 2012-11-19 NOTE — Patient Instructions (Addendum)
Blood Pressure monitor - Omiron arm cuff   Blood Pressure Record Sheet  We recommend that you get a series of blood pressure readings before your next office visit. It is best to get a reading in the morning and one in the evening. You should make sure you sit and relax for 1 to 5 minutes before the reading is taken. Feet flat on the floor, no crossed legs.    Date: _______________________  a.m. _____________________  p.m. _____________________ Date: _______________________  a.m. _____________________  p.m. _____________________ Date: _______________________  a.m. _____________________  p.m. _____________________ Date: _______________________  a.m. _____________________  p.m. _____________________ Date: _______________________  a.m. _____________________  p.m. _____________________ Document Released: 03/16/2003 Document Revised: 09/09/2011 Document Reviewed: 06/17/2005 ExitCare Patient Information 2014 [place], [place].

## 2012-11-19 NOTE — Progress Notes (Signed)
Diabetes Flow Sheet:  Visit 1  Chief Complaint:   Chief Complaint  Patient presents with  . Diabetes     HPI:   Type 2 diabetes diagnosed in March 2014. Started metformin 500mg  bid.  Tolerating well. Patient is trying very hard to make diet changes recommended.  She has lost a total of 10 lbs since her initial diagnosis of DM.   Filed Vitals:   11/19/12 0816  BP: 146/78  Pulse: 70   Filed Weights   11/19/12 0816  Weight: 190 lb (86.183 kg)     Exam Edema:  Negative Polyuria:  positive  Polydipsia:  negatvie Polyphagia:  negative  BMI:  Body mass index is 31.62 kg/(m^2).    Weight changes:  decreased by 10 lbs. Since diabetes diagnosed General Appearance:  obese Mood/Affect:  normal  Low fat/carbohydrate diet?  Yes Nicotine Abuse?  No Medication Compliance?  Yes Exercise?  Yes - walking track 5 times.   Alcohol Abuse?  No  Home BG Readings:  112, 141, 145, 138, 115, 98, 120, 130. 117, 102, 118, 95, 112, 130, 127, 205., Lowest = 79 Highest = 205 Average = 125  Lab Results  Component Value Date   HGBA1C 8.1* 09/02/2012    Lab Results  Component Value Date   CHOL 204* 09/02/2012   HDL 36 09/02/2012   LDLCALC 191 09/02/2012   TRIG 130 09/02/2012       Assessment: 1.  type 2 Diabetes - HBG reveals improved BG control - A1C due 11/2012 2.  Blood Pressure Control.  BP improved since last visit 3.  Lipid Control.  LDL not at goal - patient recently started pravastatin 10mg    Recommendations: 1.  Continue 1500 calorie, carbohydrate counting diet 2.  Continue daily walking.  Increase physical activity.   Patient is reminded to always carry glucose tablets, lifesavers, hard candies, etc., while exercising in case of hypoglycemic event.  3.  Patient is counseled on pathophysiology of diabetes and the risk of long-term complications.  Fasting blood glucose goals are 80-120mg /dL.  Post-prandial goals are < 140.  A1C goals < 6.5%. 4.  LDL goal of < 100, HDL > 40 and TG < 150 (if  tolerated a higher dose of pravastatin is recommended - labs due June 2014) ;  5.  BP goal < 140/80 - patient is instructed to get home BP monitor to check BP at home.  Given BP log and will return at next visit. 6.  Continue to check BG once daily at varying times.   7.  Medication recommendations at this time are as follows:  Continue metformin 500mg  1 tablet BID 8.  Reviewed signs and symptoms of hypoglycemia and how to treat.    Due recheck labs June 2014.  DEXA and [facility] - 12/02/2012 Dr Modesto Charon - July 2014   Time spent counseling patient:  25 minutes    Henrene Pastor, PharmD, CPP

## 2012-11-30 ENCOUNTER — Other Ambulatory Visit: Payer: Self-pay

## 2012-11-30 MED ORDER — ERGOCALCIFEROL 1.25 MG (50000 UT) PO CAPS: 50000.0000 [IU] | ORAL_CAPSULE | ORAL | Status: AC

## 2012-11-30 NOTE — Telephone Encounter (Signed)
Last vit D level 09/02/12   15 Low   Last seen 11/19/12

## 2012-12-02 ENCOUNTER — Ambulatory Visit (INDEPENDENT_AMBULATORY_CARE_PROVIDER_SITE_OTHER): Payer: Medicare Other

## 2012-12-02 ENCOUNTER — Encounter: Payer: Self-pay | Admitting: Pharmacist

## 2012-12-02 ENCOUNTER — Ambulatory Visit (INDEPENDENT_AMBULATORY_CARE_PROVIDER_SITE_OTHER): Payer: Medicare Other | Admitting: Pharmacist

## 2012-12-02 VITALS — BP 132/70 | HR 72 | Ht 63.5 in | Wt 184.5 lb

## 2012-12-02 DIAGNOSIS — M949 Disorder of cartilage, unspecified: Secondary | ICD-10-CM

## 2012-12-02 DIAGNOSIS — M899 Disorder of bone, unspecified: Secondary | ICD-10-CM

## 2012-12-02 DIAGNOSIS — M810 Age-related osteoporosis without current pathological fracture: Secondary | ICD-10-CM

## 2012-12-02 DIAGNOSIS — E559 Vitamin D deficiency, unspecified: Secondary | ICD-10-CM

## 2012-12-02 MED ORDER — ALENDRONATE SODIUM 70 MG PO TABS: 70.0000 mg | ORAL_TABLET | ORAL | Status: DC

## 2012-12-02 NOTE — Progress Notes (Signed)
Patient ID: Sheila Ray, female   DOB: May 28, 1941, 72 y.o.   MRN: [id number]  [facility] Current Height: Height: 5' 3.5" (161.3 cm)      Max Lifetime Height:  5\' 5"  Current Weight: Weight: 184 lb 8 oz (83.689 kg)       Ethnicity:Caucasian  BP:       HR:         HPI: Does pt already have a diagnosis of:  Osteopenia?  Yes Osteoporosis?  No  Back Pain?  No       Kyphosis?  No Prior fracture?  Yes -arm 01/2013 Med(s) for Osteoporosis/Osteopenia:  None currently Med(s) previously tried for Osteoporosis/Osteopenia:  actonel - was taking at last DEXA in 2008 but appears sometime around 2009 patient stopped - she is unsure why.                                                             PMH: Age at menopause:  71's Hysterectomy?  Yes Oophorectomy?  Yes HRT? Yes - Former.  Type/duration: premarin  Steroid Use?  No Thyroid med?  Yes History of cancer?  No History of digestive disorders (ie Crohn's)?  No Current or previous eating disorders?  No Last Vitamin D Result:  15 (09/02/2012) - started vitamin D supplement 50,000 IU weekly Last GFR Result:  88 (09/02/2012)   FH/SH: Family history of osteoporosis?  Yes -sister Parent with history of hip fracture?  No Family history of breast cancer?  No Exercise?  Yes - walking 3 days per week Smoking?  No Alcohol?  No    Calcium Assessment Calcium Intake  # of servings/day  Calcium mg  Milk (8 oz) 2  x  300  = 600mg   Yogurt (4 oz) 0 x  200 = 0  Cheese (1 oz) 1 - 2 x  200 = 200mg -400mg   Other Calcium sources   250mg   Ca supplement none = none   Estimated calcium intake per day 1050mg  - 1250mg     DEXA Results Date of Test T-Score for AP Spine L1-L4 T-Score for Total Left Hip T-Score for Total Right Hip  12/02/2012 -1.2 -1.4 -2.1  11/10/2006 -0.7 -1.0 -1.4  04/18/2003 -1.2 -1.1 --        T-Score of right neck of femur was -2.7 today  Assessment: osteoporosis  Recommendations: 1.  Start  alendronate (FOSAMAX) 70mg  1  tablet by mouth weekly.  Take first thing in morning with full glass of water.  Nothing to eat or drink or do not lie down or recline for 30 minutes after taking alendrontate. 2.  recommend calcium 1200mg  daily through supplementation and/or diet.   3.  recommend weight bearing exercise - 30 minutes at least 4 days   per week.   4.  Counseled and educated about fall risk and prevention.  Recheck DEXA:  2 years  Time spent counseling patient:  30 minutes

## 2012-12-02 NOTE — Patient Instructions (Addendum)

## 2012-12-25 ENCOUNTER — Other Ambulatory Visit: Payer: Self-pay | Admitting: *Deleted

## 2012-12-25 MED ORDER — PRAVASTATIN SODIUM 10 MG PO TABS: 10.0000 mg | ORAL_TABLET | Freq: Every day | ORAL | Status: DC

## 2013-01-06 ENCOUNTER — Encounter: Payer: Self-pay | Admitting: Family Medicine

## 2013-01-06 ENCOUNTER — Ambulatory Visit (INDEPENDENT_AMBULATORY_CARE_PROVIDER_SITE_OTHER): Payer: Medicare Other | Admitting: Family Medicine

## 2013-01-06 VITALS — BP 152/80 | HR 74 | Temp 99.1°F | Ht 63.5 in | Wt 187.0 lb

## 2013-01-06 DIAGNOSIS — E119 Type 2 diabetes mellitus without complications: Secondary | ICD-10-CM

## 2013-01-06 DIAGNOSIS — I10 Essential (primary) hypertension: Secondary | ICD-10-CM

## 2013-01-06 DIAGNOSIS — R635 Abnormal weight gain: Secondary | ICD-10-CM

## 2013-01-06 DIAGNOSIS — E785 Hyperlipidemia, unspecified: Secondary | ICD-10-CM

## 2013-01-06 DIAGNOSIS — E669 Obesity, unspecified: Secondary | ICD-10-CM

## 2013-01-06 LAB — POCT GLYCOSYLATED HEMOGLOBIN (HGB A1C): Hemoglobin A1C: 6.2

## 2013-01-06 NOTE — Patient Instructions (Addendum)
      Dr Maxine Fredman's Recommendations  Diet and Exercise discussed with patient.  For nutrition information, I recommend books:  1).Eat to Live by Dr Joel Fuhrman. 2).Prevent and Reverse Heart Disease by Dr Caldwell Esselstyn. 3) Dr Neal Barnard's Book:  Program to Reverse Diabetes  Exercise recommendations are:  If unable to walk, then the patient can exercise in a chair 3 times a day. By flapping arms like a bird gently and raising legs outwards to the front.  If ambulatory, the patient can go for walks for 30 minutes 3 times a week. Then increase the intensity and duration as tolerated.  Goal is to try to attain exercise frequency to 5 times a week.  If applicable: Best to perform resistance exercises (machines or weights) 2 days a week and cardio type exercises 3 days per week.  

## 2013-01-06 NOTE — Progress Notes (Signed)
Patient ID: Sheila Ray, female   DOB: [birth year], 72 y.o.   MRN: [id number] SUBJECTIVE: CC: Chief Complaint  Patient presents with  . Diabetes  . Hypertension  . Hyperlipidemia    HPI: Patient is here for follow up of Diabetes Mellitus: Symptoms of DM: Denies Nocturia ,Denies Urinary Frequency , denies Blurred vision ,deniesDizziness,denies.Dysuria,denies paresthesias, denies extremity pain or ulcers.Marland Kitchendenies chest pain. has had an annual eye exam. do check the feet. Does check CBGs. Average CBG:115 Denies episodes of hypoglycemia. Does have an emergency hypoglycemic plan. admits toCompliance with medications. Denies Problems with medications.  Past Medical History  Diagnosis Date  . Hypothyroid   . Hypertension   . Goiter   . Obese   . Osteopenia   . Fatigue   . Gastric polyp   . Diverticulosis   . Rosacea   . Postmenopausal   . Vitamin D deficiency   . Hyperlipidemia   . Diabetes mellitus without complication    Past Surgical History  Procedure Laterality Date  . Abdominal hysterectomy  1985  . Foot surgery Right 08/2002   History   Social History  . Marital Status: Divorced    Spouse Name: N/A    Number of Children: N/A  . Years of Education: N/A   Occupational History  . Not on file.   Social History Main Topics  . Smoking status: Never Smoker   . Smokeless tobacco: Not on file  . Alcohol Use: No  . Drug Use: No  . Sexually Active: Not on file   Other Topics Concern  . Not on file   Social History Narrative  . No narrative on file   Family History  Problem Relation Age of Onset  . Osteoporosis Mother    Current Outpatient Prescriptions on File Prior to Visit  Medication Sig Dispense Refill  . alendronate (FOSAMAX) 70 MG tablet Take 1 tablet (70 mg total) by mouth every 7 (seven) days. Take with a full glass of water on an empty stomach.  4 tablet  11  . amLODipine (NORVASC) 5 MG tablet Take 5 mg by mouth daily.      Marland Kitchen aspirin 81 MG tablet  Take 81 mg by mouth daily.      . ergocalciferol (VITAMIN D2) 50000 UNITS capsule Take 1 capsule (50,000 Units total) by mouth once a week.  4 capsule  2  . glucose blood (ONE TOUCH ULTRA TEST) test strip Use to check BG daily Dx:250.02  100 each  2  . lisinopril (PRINIVIL,ZESTRIL) 10 MG tablet Take 10 mg by mouth daily.      . metFORMIN (GLUCOPHAGE) 1000 MG tablet Take 1 tablet (1,000 mg total) by mouth 2 (two) times daily with a meal.  60 tablet  5  . ONETOUCH DELICA LANCETS 33G MISC 1 each by Does not apply route daily. Use to check BG daily Dx:  250.02  100 each  2  . pravastatin (PRAVACHOL) 10 MG tablet Take 1 tablet (10 mg total) by mouth at bedtime.  30 tablet  2   No current facility-administered medications on file prior to visit.   Allergies  Allergen Reactions  . Crestor (Rosuvastatin)     Cramps  . Lipitor (Atorvastatin)     Cramps   Immunization History  Administered Date(s) Administered  . Influenza Whole 05/16/2012  . Pneumococcal Polysaccharide 11/29/2005  . Td 09/14/2002  . Zoster 04/17/2011   Prior to Admission medications   Medication Sig Start Date End Date Taking? Authorizing Provider  alendronate (FOSAMAX) 70 MG tablet Take 1 tablet (70 mg total) by mouth every 7 (seven) days. Take with a full glass of water on an empty stomach. 12/02/12  Yes Ileana Ladd, MD  amLODipine (NORVASC) 5 MG tablet Take 5 mg by mouth daily.   Yes Historical Provider, MD  aspirin 81 MG tablet Take 81 mg by mouth daily.   Yes Historical Provider, MD  ergocalciferol (VITAMIN D2) 50000 UNITS capsule Take 1 capsule (50,000 Units total) by mouth once a week. 11/30/12 02/25/13 Yes Ileana Ladd, MD  glucose blood (ONE TOUCH ULTRA TEST) test strip Use to check BG daily Dx:250.02 10/19/12  Yes Tammy Eckard, PHARMD  lisinopril (PRINIVIL,ZESTRIL) 10 MG tablet Take 10 mg by mouth daily.   Yes Historical Provider, MD  metFORMIN (GLUCOPHAGE) 1000 MG tablet Take 1 tablet (1,000 mg total) by mouth 2  (two) times daily with a meal. 10/22/12  Yes Ileana Ladd, MD  [facility] DELICA LANCETS 33G MISC 1 each by Does not apply route daily. Use to check BG daily Dx:  250.02 10/19/12  Yes Tammy Eckard, PHARMD  pravastatin (PRAVACHOL) 10 MG tablet Take 1 tablet (10 mg total) by mouth at bedtime. 12/25/12  Yes Ileana Ladd, MD    ROS: As above in the HPI. All other systems are stable or negative.  OBJECTIVE: APPEARANCE:  Patient in no acute distress.The patient appeared well nourished and normally developed. Acyanotic. Waist: VITAL SIGNS:BP 152/80  Pulse 74  Temp(Src) 99.1 F (37.3 C) (Oral)  Ht 5' 3.5" (1.613 m)  Wt 187 lb (84.823 kg)  BMI 32.6 kg/m2 WF obese, pleasant lady  SKIN: warm and  Dry without overt rashes, tattoos and scars  HEAD and Neck: without JVD, Head and scalp: normal Eyes:No scleral icterus. Fundi normal, eye movements normal. Ears: Auricle normal, canal normal, Tympanic membranes normal, insufflation normal. Nose: normal Throat: normal Neck & thyroid: normal  CHEST & LUNGS: Chest wall: normal Lungs: Clear  CVS: Reveals the PMI to be normally located. Regular rhythm, First and Second Heart sounds are normal,  absence of murmurs, rubs or gallops. Peripheral vasculature: Radial pulses: normal Dorsal pedis pulses: normal Posterior pulses: normal  ABDOMEN:  Appearance: obese Benign, no organomegaly, no masses, no Abdominal Aortic enlargement. No Guarding , no rebound. No Bruits. Bowel sounds: normal  RECTAL: N/A GU: N/A  EXTREMETIES: nonedematous. Both Femoral and Pedal pulses are normal.  MUSCULOSKELETAL:  Spine: normal Joints: intact  NEUROLOGIC: oriented to time,place and person; nonfocal. Strength is normal Sensory is normal Reflexes are normal Cranial Nerves are normal.  Results for orders placed in visit on 01/06/13  POCT GLYCOSYLATED HEMOGLOBIN (HGB A1C)      Result Value Range   Hemoglobin A1C 6.2%      ASSESSMENT: Unspecified  essential hypertension - Plan: COMPLETE METABOLIC PANEL WITH GFR  Type II or unspecified type diabetes mellitus without mention of complication, not stated as uncontrolled - Plan: POCT glycosylated hemoglobin (Hb A1C), COMPLETE METABOLIC PANEL WITH GFR, POCT UA - Microalbumin  Other and unspecified hyperlipidemia - Plan: COMPLETE METABOLIC PANEL WITH GFR, NMR Lipoprofile with Lipids  Obesity, unspecified  PLAN: Orders Placed This Encounter  Procedures  . COMPLETE METABOLIC PANEL WITH GFR  . NMR Lipoprofile with Lipids  . POCT glycosylated hemoglobin (Hb A1C)  . POCT UA - Microalbumin   Same meds for now. Daily foot checks, annual eye exam.       Dr Woodroe Mode Recommendations  Diet and Exercise discussed with  patient.  For nutrition information, I recommend books:  1).Eat to Live by Dr Monico Hoar. 2).Prevent and Reverse Heart Disease by Dr Suzzette Righter. 3) Dr Katherina Right Book:  Program to Reverse Diabetes  Exercise recommendations are:  If unable to walk, then the patient can exercise in a chair 3 times a day. By flapping arms like a bird gently and raising legs outwards to the front.  If ambulatory, the patient can go for walks for 30 minutes 3 times a week. Then increase the intensity and duration as tolerated.  Goal is to try to attain exercise frequency to 5 times a week.  If applicable: Best to perform resistance exercises (machines or weights) 2 days a week and cardio type exercises 3 days per week.  Return in about 4 months (around 05/09/2013) for Recheck medical problems.  Brandin Dilday P. Modesto Charon, M.D.

## 2013-01-07 LAB — NMR LIPOPROFILE WITH LIPIDS
Cholesterol, Total: 173 mg/dL (ref <200)
HDL Particle Number: 29.3 umol/L — ABNORMAL LOW (ref >=30.5)
HDL Size: 8.8 nm — ABNORMAL LOW (ref >=9.2)
HDL-C: 41 mg/dL (ref >=40)
LDL (calc): 113 mg/dL — ABNORMAL HIGH (ref <100)
LDL Particle Number: 1683 nmol/L — ABNORMAL HIGH (ref <1000)
LDL Size: 20.4 nm — ABNORMAL LOW (ref >20.5)
LP-IR Score: 57 — ABNORMAL HIGH (ref <=45)
Large HDL-P: 4 umol/L — ABNORMAL LOW (ref >=4.8)
Large VLDL-P: 4 nmol/L — ABNORMAL HIGH (ref <=2.7)
Small LDL Particle Number: 956 nmol/L — ABNORMAL HIGH (ref <=527)
Triglycerides: 95 mg/dL (ref <150)
VLDL Size: 43.7 nm (ref <=46.6)

## 2013-01-07 LAB — COMPLETE METABOLIC PANEL WITH GFR
ALT: 23 U/L (ref 0–35)
AST: 16 U/L (ref 0–37)
Albumin: 4.1 g/dL (ref 3.5–5.2)
Alkaline Phosphatase: 60 U/L (ref 39–117)
BUN: 14 mg/dL (ref 6–23)
CO2: 26 mEq/L (ref 19–32)
Calcium: 9.7 mg/dL (ref 8.4–10.5)
Chloride: 105 mEq/L (ref 96–112)
Creat: 0.75 mg/dL (ref 0.50–1.10)
GFR, Est African American: >89 mL/min
GFR, Est Non African American: 80 mL/min
Glucose, Bld: 103 mg/dL — ABNORMAL HIGH (ref 70–99)
Potassium: 4.3 mEq/L (ref 3.5–5.3)
Sodium: 141 mEq/L (ref 135–145)
Total Bilirubin: 0.4 mg/dL (ref 0.3–1.2)
Total Protein: 6.5 g/dL (ref 6.0–8.3)

## 2013-01-11 ENCOUNTER — Encounter: Payer: Self-pay | Admitting: Family Medicine

## 2013-01-11 ENCOUNTER — Telehealth: Payer: Self-pay | Admitting: Family Medicine

## 2013-01-11 ENCOUNTER — Ambulatory Visit (INDEPENDENT_AMBULATORY_CARE_PROVIDER_SITE_OTHER): Payer: Medicare Other | Admitting: Family Medicine

## 2013-01-11 VITALS — BP 149/82 | HR 65 | Temp 97.3°F | Wt 188.6 lb

## 2013-01-11 DIAGNOSIS — IMO0002 Reserved for concepts with insufficient information to code with codable children: Secondary | ICD-10-CM | POA: Insufficient documentation

## 2013-01-11 DIAGNOSIS — L03039 Cellulitis of unspecified toe: Secondary | ICD-10-CM

## 2013-01-11 DIAGNOSIS — L02619 Cutaneous abscess of unspecified foot: Secondary | ICD-10-CM

## 2013-01-11 DIAGNOSIS — E119 Type 2 diabetes mellitus without complications: Secondary | ICD-10-CM

## 2013-01-11 DIAGNOSIS — L02611 Cutaneous abscess of right foot: Secondary | ICD-10-CM

## 2013-01-11 MED ORDER — SULFAMETHOXAZOLE-TMP DS 800-160 MG PO TABS
1.0000 | ORAL_TABLET | Freq: Two times a day (BID) | ORAL | Status: DC
Start: 2013-01-11 — End: 2013-03-04

## 2013-01-11 NOTE — Progress Notes (Signed)
Patient ID: Sheila Ray, female   DOB: [birth year], 72 y.o.   MRN: [id number] SUBJECTIVE: CC: Chief Complaint  Patient presents with  . Acute Visit    ABSCESS TOE RT GREAT TOENAIL STATES SAW DR DRAKE 2 MONTHS AGO AND HE TRIMMED NAIL     HPI: As above. Last night it got red and a little  Swollen and pus popped out of it. No fever. No more pus came out. Sugars doing well. Came to get antibiotics to ensure it doesn't get worse.   Past Medical History  Diagnosis Date  . Hypothyroid   . Hypertension   . Goiter   . Obese   . Osteopenia   . Fatigue   . Gastric polyp   . Diverticulosis   . Rosacea   . Postmenopausal   . Vitamin D deficiency   . Hyperlipidemia   . Diabetes mellitus without complication    Past Surgical History  Procedure Laterality Date  . Abdominal hysterectomy  1985  . Foot surgery Right 08/2002   History   Social History  . Marital Status: Divorced    Spouse Name: N/A    Number of Children: N/A  . Years of Education: N/A   Occupational History  . Not on file.   Social History Main Topics  . Smoking status: Never Smoker   . Smokeless tobacco: Not on file  . Alcohol Use: No  . Drug Use: No  . Sexually Active: Not on file   Other Topics Concern  . Not on file   Social History Narrative  . No narrative on file   Family History  Problem Relation Age of Onset  . Osteoporosis Mother    Current Outpatient Prescriptions on File Prior to Visit  Medication Sig Dispense Refill  . alendronate (FOSAMAX) 70 MG tablet Take 1 tablet (70 mg total) by mouth every 7 (seven) days. Take with a full glass of water on an empty stomach.  4 tablet  11  . amLODipine (NORVASC) 5 MG tablet Take 5 mg by mouth daily.      Marland Kitchen aspirin 81 MG tablet Take 81 mg by mouth daily.      . ergocalciferol (VITAMIN D2) 50000 UNITS capsule Take 1 capsule (50,000 Units total) by mouth once a week.  4 capsule  2  . glucose blood (ONE TOUCH ULTRA TEST) test strip Use to check BG  daily Dx:250.02  100 each  2  . lisinopril (PRINIVIL,ZESTRIL) 10 MG tablet Take 10 mg by mouth daily.      . metFORMIN (GLUCOPHAGE) 1000 MG tablet Take 1 tablet (1,000 mg total) by mouth 2 (two) times daily with a meal.  60 tablet  5  . ONETOUCH DELICA LANCETS 33G MISC 1 each by Does not apply route daily. Use to check BG daily Dx:  250.02  100 each  2  . pravastatin (PRAVACHOL) 10 MG tablet Take 1 tablet (10 mg total) by mouth at bedtime.  30 tablet  2   No current facility-administered medications on file prior to visit.   Allergies  Allergen Reactions  . Crestor (Rosuvastatin)     Cramps  . Lipitor (Atorvastatin)     Cramps   Immunization History  Administered Date(s) Administered  . Influenza Whole 05/16/2012  . Pneumococcal Polysaccharide 11/29/2005  . Td 09/14/2002  . Zoster 04/17/2011   Prior to Admission medications   Medication Sig Start Date End Date Taking? Authorizing Provider  alendronate (FOSAMAX) 70 MG tablet Take 1 tablet (  70 mg total) by mouth every 7 (seven) days. Take with a full glass of water on an empty stomach. 12/02/12  Yes Ileana Ladd, MD  amLODipine (NORVASC) 5 MG tablet Take 5 mg by mouth daily.   Yes Historical Provider, MD  aspirin 81 MG tablet Take 81 mg by mouth daily.   Yes Historical Provider, MD  ergocalciferol (VITAMIN D2) 50000 UNITS capsule Take 1 capsule (50,000 Units total) by mouth once a week. 11/30/12 02/25/13 Yes Ileana Ladd, MD  glucose blood (ONE TOUCH ULTRA TEST) test strip Use to check BG daily Dx:250.02 10/19/12  Yes Tammy Eckard, PHARMD  lisinopril (PRINIVIL,ZESTRIL) 10 MG tablet Take 10 mg by mouth daily.   Yes Historical Provider, MD  metFORMIN (GLUCOPHAGE) 1000 MG tablet Take 1 tablet (1,000 mg total) by mouth 2 (two) times daily with a meal. 10/22/12  Yes Ileana Ladd, MD  [facility] DELICA LANCETS 33G MISC 1 each by Does not apply route daily. Use to check BG daily Dx:  250.02 10/19/12  Yes Tammy Eckard, PHARMD  pravastatin  (PRAVACHOL) 10 MG tablet Take 1 tablet (10 mg total) by mouth at bedtime. 12/25/12  Yes Ileana Ladd, MD     ROS: As above in the HPI. All other systems are stable or negative.  OBJECTIVE: APPEARANCE:  Patient in no acute distress.The patient appeared well nourished and normally developed. Acyanotic. Waist: VITAL SIGNS:BP 149/82  Pulse 65  Temp(Src) 97.3 F (36.3 C) (Oral)  Wt 188 lb 9.6 oz (85.548 kg)  BMI 32.88 kg/m2 WF   SKIN: warm and  Dry mild redness at the base of the nail of the right Big toe.  HEAD and Neck: without JVD, Head and scalp: normal Eyes:No scleral icterus. Fundi normal, eye movements normal. Ears: Auricle normal, canal normal, Tympanic membranes normal, insufflation normal. Nose: normal Throat: normal Neck & thyroid: normal  CHEST & LUNGS: Chest wall: normal Lungs: Clear  CVS: Reveals the PMI to be normally located. Regular rhythm, First and Second Heart sounds are normal,  absence of murmurs, rubs or gallops. Peripheral vasculature: Radial pulses: normal Dorsal pedis pulses: normal Posterior pulses: normal  ABDOMEN:  Appearance: normal Benign, no organomegaly, no masses, no Abdominal Aortic enlargement. No Guarding , no rebound. No Bruits. Bowel sounds: normal  RECTAL: N/A GU: N/A  EXTREMETIES: nonedematous. Mild paronychia with redness at the lateral base of the right big toenail. No pus  Expressed.  ASSESSMENT: Abscess or cellulitis, toe, right - big toe - Plan: sulfamethoxazole-trimethoprim (BACTRIM DS) 800-160 MG per tablet  Type II or unspecified type diabetes mellitus without mention of complication, not stated as uncontrolled   PLAN: No orders of the defined types were placed in this encounter.   Meds ordered this encounter  Medications  . sulfamethoxazole-trimethoprim (BACTRIM DS) 800-160 MG per tablet    Sig: Take 1 tablet by mouth 2 (two) times daily.    Dispense:  20 tablet    Refill:  0   Return in about 10  days (around 01/21/2013) for recheck toe infection.Thelma Barge P. [place] Charon, M.D.

## 2013-01-11 NOTE — Patient Instructions (Signed)
Soak in epsom salts. Elevate. And check feet closely daily. Recheck in 10 days.

## 2013-01-13 ENCOUNTER — Other Ambulatory Visit: Payer: Self-pay | Admitting: Family Medicine

## 2013-01-13 MED ORDER — PRAVASTATIN SODIUM 20 MG PO TABS: 20.0000 mg | ORAL_TABLET | Freq: Every day | ORAL | Status: DC

## 2013-01-15 DIAGNOSIS — I639 Cerebral infarction, unspecified: Secondary | ICD-10-CM

## 2013-01-15 HISTORY — DX: Cerebral infarction, unspecified: I63.9

## 2013-01-22 ENCOUNTER — Other Ambulatory Visit: Payer: Self-pay | Admitting: *Deleted

## 2013-01-22 MED ORDER — AMLODIPINE BESYLATE 5 MG PO TABS: 5.0000 mg | ORAL_TABLET | Freq: Every day | ORAL | Status: DC

## 2013-01-25 ENCOUNTER — Ambulatory Visit: Payer: Medicare Other | Admitting: Family Medicine

## 2013-03-03 ENCOUNTER — Telehealth: Payer: Self-pay | Admitting: Family Medicine

## 2013-03-03 NOTE — Telephone Encounter (Signed)
Appt given for tomorrow per pt request 

## 2013-03-04 ENCOUNTER — Encounter: Payer: Self-pay | Admitting: Family Medicine

## 2013-03-04 ENCOUNTER — Ambulatory Visit (INDEPENDENT_AMBULATORY_CARE_PROVIDER_SITE_OTHER): Payer: Medicare Other | Admitting: Family Medicine

## 2013-03-04 VITALS — BP 141/84 | HR 64 | Temp 97.1°F | Ht 63.0 in | Wt 187.8 lb

## 2013-03-04 DIAGNOSIS — E119 Type 2 diabetes mellitus without complications: Secondary | ICD-10-CM

## 2013-03-04 DIAGNOSIS — I6350 Cerebral infarction due to unspecified occlusion or stenosis of unspecified cerebral artery: Secondary | ICD-10-CM

## 2013-03-04 DIAGNOSIS — I639 Cerebral infarction, unspecified: Secondary | ICD-10-CM

## 2013-03-04 DIAGNOSIS — E669 Obesity, unspecified: Secondary | ICD-10-CM

## 2013-03-04 DIAGNOSIS — E039 Hypothyroidism, unspecified: Secondary | ICD-10-CM | POA: Insufficient documentation

## 2013-03-04 DIAGNOSIS — E559 Vitamin D deficiency, unspecified: Secondary | ICD-10-CM

## 2013-03-04 DIAGNOSIS — R635 Abnormal weight gain: Secondary | ICD-10-CM

## 2013-03-04 DIAGNOSIS — E785 Hyperlipidemia, unspecified: Secondary | ICD-10-CM

## 2013-03-04 DIAGNOSIS — M810 Age-related osteoporosis without current pathological fracture: Secondary | ICD-10-CM

## 2013-03-04 DIAGNOSIS — I10 Essential (primary) hypertension: Secondary | ICD-10-CM

## 2013-03-04 NOTE — Progress Notes (Signed)
Patient ID: Sheila Ray, female   DOB: 03-Dec-1940, 72 y.o.   MRN: [id number] SUBJECTIVE: CC: Chief Complaint  Patient presents with  . 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 No residual effects or deficits.  Follow up on risk factors Patient is here for follow up of Diabetes Mellitus: Symptoms evaluated: Denies Nocturia ,Denies Urinary Frequency , denies Blurred vision ,deniesDizziness,denies.Dysuria,denies paresthesias, denies extremity pain or ulcers.Marland Kitchendenies chest pain. has had an annual eye exam. do check the feet. Does check CBGs. Average CBG:115-130 Denies episodes of hypoglycemia. Does have an emergency hypoglycemic plan. admits toCompliance with medications. Denies Problems with medications.  Breakfast: pack of Nabbs and a diet Dr Reino Kent Lunch: green salad with dressing Supper: green salad and baked chicken.  Past Medical History  Diagnosis Date  . Hypothyroid   . Hypertension   . Goiter   . Obese   . Osteopenia   . Fatigue   . Gastric polyp   . Diverticulosis   . Rosacea   . Postmenopausal   . Vitamin D deficiency   . Hyperlipidemia   . Diabetes mellitus without complication    Past Surgical History  Procedure Laterality Date  . Abdominal hysterectomy  1985  . Foot surgery Right 08/2002   History   Social History  . Marital Status: Divorced    Spouse Name: N/A    Number of Children: N/A  . Years of Education: N/A   Occupational History  . Not on file.   Social History Main Topics  . Smoking status: Never Smoker   . Smokeless tobacco: Not on file  . Alcohol Use: No  . Drug Use: No  . Sexual Activity: Not on file   Other Topics Concern  . Not on file   Social History Narrative  . No narrative on file   Family History  Problem Relation Age of Onset  . Osteoporosis Mother    Current Outpatient Prescriptions on File Prior to Visit  Medication Sig  Dispense Refill  . alendronate (FOSAMAX) 70 MG tablet Take 1 tablet (70 mg total) by mouth every 7 (seven) days. Take with a full glass of water on an empty stomach.  4 tablet  11  . amLODipine (NORVASC) 5 MG tablet Take 1 tablet (5 mg total) by mouth daily.  30 tablet  5  . aspirin 81 MG tablet Take 81 mg by mouth daily.      Marland Kitchen glucose blood (ONE TOUCH ULTRA TEST) test strip Use to check BG daily Dx:250.02  100 each  2  . lisinopril (PRINIVIL,ZESTRIL) 10 MG tablet Take 10 mg by mouth daily.      . metFORMIN (GLUCOPHAGE) 1000 MG tablet Take 1 tablet (1,000 mg total) by mouth 2 (two) times daily with a meal.  60 tablet  5  . ONETOUCH DELICA LANCETS 33G MISC 1 each by Does not apply route daily. Use to check BG daily Dx:  250.02  100 each  2  . pravastatin (PRAVACHOL) 20 MG tablet Take 1 tablet (20 mg total) by mouth at bedtime.  30 tablet  3   No current facility-administered medications on file prior to visit.   Allergies  Allergen Reactions  . Crestor [Rosuvastatin]     Cramps  . Lipitor [Atorvastatin]     Cramps   Immunization History  Administered Date(s) Administered  . Influenza Whole 05/16/2012  . Pneumococcal Polysaccharide  11/29/2005  . Td 09/14/2002  . Zoster 04/17/2011   Prior to Admission medications   Medication Sig Start Date End Date Taking? Authorizing Provider  alendronate (FOSAMAX) 70 MG tablet Take 1 tablet (70 mg total) by mouth every 7 (seven) days. Take with a full glass of water on an empty stomach. 12/02/12  Yes Ileana Ladd, MD  amLODipine (NORVASC) 5 MG tablet Take 1 tablet (5 mg total) by mouth daily. 01/22/13  Yes Ileana Ladd, MD  aspirin 81 MG tablet Take 81 mg by mouth daily.   Yes Historical Provider, MD  glucose blood (ONE TOUCH ULTRA TEST) test strip Use to check BG daily Dx:250.02 10/19/12  Yes Tammy Eckard, PHARMD  lisinopril (PRINIVIL,ZESTRIL) 10 MG tablet Take 10 mg by mouth daily.   Yes Historical Provider, MD  metFORMIN (GLUCOPHAGE) 1000 MG  tablet Take 1 tablet (1,000 mg total) by mouth 2 (two) times daily with a meal. 10/22/12  Yes Ileana Ladd, MD  [facility] DELICA LANCETS 33G MISC 1 each by Does not apply route daily. Use to check BG daily Dx:  250.02 10/19/12  Yes Tammy Eckard, PHARMD  pravastatin (PRAVACHOL) 20 MG tablet Take 1 tablet (20 mg total) by mouth at bedtime. 01/13/13  Yes Ileana Ladd, MD    ROS: As above in the HPI. All other systems are stable or negative.  OBJECTIVE: APPEARANCE:  Patient in no acute distress.The patient appeared well nourished and normally developed. Acyanotic. Waist: VITAL SIGNS:BP 141/84  Pulse 64  Temp(Src) 97.1 F (36.2 C) (Oral)  Ht 5\' 3"  (1.6 m)  Wt 187 lb 12.8 oz (85.186 kg)  BMI 33.28 kg/m2 WF  SKIN: warm and  Dry without overt rashes, tattoos and scars  HEAD and Neck: without JVD, Head and scalp: normal Eyes:No scleral icterus. Fundi normal, eye movements normal. Ears: Auricle normal, canal normal, Tympanic membranes normal, insufflation normal. Nose: normal Throat: normal Neck & thyroid: normal  CHEST & LUNGS: Chest wall: normal Lungs: Clear  CVS: Reveals the PMI to be normally located. Regular rhythm, First and Second Heart sounds are normal,  absence of murmurs, rubs or gallops. Peripheral vasculature: Radial pulses: normal Dorsal pedis pulses: normal Posterior pulses: normal  ABDOMEN:  Appearance: normal Benign, no organomegaly, no masses, no Abdominal Aortic enlargement. No Guarding , no rebound. No Bruits. Bowel sounds: normal  RECTAL: N/A GU: N/A  EXTREMETIES: nonedematous.  MUSCULOSKELETAL:  Spine: normal Joints: intact  NEUROLOGIC: oriented to time,place and person; nonfocal. Strength is normal Sensory is normal Reflexes are normal Cranial Nerves are normal.  ASSESSMENT: Type II or unspecified type diabetes mellitus without mention of complication, not stated as uncontrolled  Unspecified essential  hypertension  Hypothyroid  Obesity, unspecified  Other and unspecified hyperlipidemia  Unspecified vitamin D deficiency  Stroke  Osteoporosis   PLAN: We had a long discussion of risk factor reduction with diet , exercise and medications.       Dr Woodroe Mode Recommendations  For nutrition information, I recommend books:  1).Eat to Live by Dr Monico Hoar. 2).Prevent and Reverse Heart Disease by Dr Suzzette Righter. 3) Dr Katherina Right Book:  Program to Reverse Diabetes  Exercise recommendations are:  If unable to walk, then the patient can exercise in a chair 3 times a day. By flapping arms like a bird gently and raising legs outwards to the front.  If ambulatory, the patient can go for walks for 30 minutes 3 times a week. Then increase the intensity and duration  as tolerated.  Goal is to try to attain exercise frequency to 5 times a week.  If applicable: Best to perform resistance exercises (machines or weights) 2 days a week and cardio type exercises 3 days per week.  Reviewed her medications.  Same regimen.  Results for orders placed in visit on 01/06/13  COMPLETE METABOLIC PANEL WITH GFR      Result Value Range   Sodium 141  135 - 145 mEq/L   Potassium 4.3  3.5 - 5.3 mEq/L   Chloride 105  96 - 112 mEq/L   CO2 26  19 - 32 mEq/L   Glucose, Bld 103 (*) 70 - 99 mg/dL   BUN 14  6 - 23 mg/dL   Creat 1.61  0.96 - 0.45 mg/dL   Total Bilirubin 0.4  0.3 - 1.2 mg/dL   Alkaline Phosphatase 60  39 - 117 U/L   AST 16  0 - 37 U/L   ALT 23  0 - 35 U/L   Total Protein 6.5  6.0 - 8.3 g/dL   Albumin 4.1  3.5 - 5.2 g/dL   Calcium 9.7  8.4 - 40.9 mg/dL   GFR, Est African American >89     GFR, Est Non African American 80    NMR LIPOPROFILE WITH LIPIDS      Result Value Range   LDL Particle Number [id number] (*) <1000 nmol/L   LDL (calc) 113 (*) <100 mg/dL   HDL-C 41  >=81 mg/dL   Triglycerides 95  <191 mg/dL   Cholesterol, Total 478  <200 mg/dL   HDL Particle Number  29.3 (*) >=30.5 umol/L   Large HDL-P 4.0 (*) >=4.8 umol/L   Large VLDL-P 4.0 (*) <=2.7 nmol/L   Small LDL Particle Number [id number] (*) <=527 nmol/L   LDL Size 20.4 (*) >20.5 nm   HDL Size 8.8 (*) >=9.2 nm   VLDL Size 43.7  <=46.6 nm   LP-IR Score 57 (*) <=45  POCT GLYCOSYLATED HEMOGLOBIN (HGB A1C)      Result Value Range   Hemoglobin A1C 6.2%     Return in about 6 weeks (around 04/15/2013) for Recheck medical problems.  Tirza Senteno P. [place]esto Charon, M.D.

## 2013-03-04 NOTE — Patient Instructions (Addendum)
      Dr Ilaria Much's Recommendations  For nutrition information, I recommend books:  1).Eat to Live by Dr Joel Fuhrman. 2).Prevent and Reverse Heart Disease by Dr Caldwell Esselstyn. 3) Dr Neal Barnard's Book:  Program to Reverse Diabetes  Exercise recommendations are:  If unable to walk, then the patient can exercise in a chair 3 times a day. By flapping arms like a bird gently and raising legs outwards to the front.  If ambulatory, the patient can go for walks for 30 minutes 3 times a week. Then increase the intensity and duration as tolerated.  Goal is to try to attain exercise frequency to 5 times a week.  If applicable: Best to perform resistance exercises (machines or weights) 2 days a week and cardio type exercises 3 days per week.  

## 2013-03-16 ENCOUNTER — Encounter: Payer: Self-pay | Admitting: *Deleted

## 2013-05-06 ENCOUNTER — Ambulatory Visit (INDEPENDENT_AMBULATORY_CARE_PROVIDER_SITE_OTHER): Payer: Medicare Other | Admitting: Family Medicine

## 2013-05-06 ENCOUNTER — Encounter: Payer: Self-pay | Admitting: Family Medicine

## 2013-05-06 VITALS — BP 154/82 | HR 69 | Temp 97.5°F | Ht 64.0 in | Wt 184.8 lb

## 2013-05-06 DIAGNOSIS — E559 Vitamin D deficiency, unspecified: Secondary | ICD-10-CM

## 2013-05-06 DIAGNOSIS — E785 Hyperlipidemia, unspecified: Secondary | ICD-10-CM

## 2013-05-06 DIAGNOSIS — E669 Obesity, unspecified: Secondary | ICD-10-CM

## 2013-05-06 DIAGNOSIS — I639 Cerebral infarction, unspecified: Secondary | ICD-10-CM

## 2013-05-06 DIAGNOSIS — I6350 Cerebral infarction due to unspecified occlusion or stenosis of unspecified cerebral artery: Secondary | ICD-10-CM

## 2013-05-06 DIAGNOSIS — M810 Age-related osteoporosis without current pathological fracture: Secondary | ICD-10-CM

## 2013-05-06 DIAGNOSIS — E039 Hypothyroidism, unspecified: Secondary | ICD-10-CM

## 2013-05-06 DIAGNOSIS — I10 Essential (primary) hypertension: Secondary | ICD-10-CM

## 2013-05-06 DIAGNOSIS — E119 Type 2 diabetes mellitus without complications: Secondary | ICD-10-CM

## 2013-05-06 LAB — POCT GLYCOSYLATED HEMOGLOBIN (HGB A1C): Hemoglobin A1C: 5.8

## 2013-05-06 NOTE — Progress Notes (Signed)
Patient ID: Esaw Dace, female   DOB: Nov 11, 1940, 72 y.o.   MRN: [id number] SUBJECTIVE: CC: Chief Complaint  Patient presents with  . Follow-up    4 month follow up     HPI: Patient is here for follow up of Diabetes Mellitus: Symptoms evaluated: Denies Nocturia ,Denies Urinary Frequency , denies Blurred vision ,deniesDizziness,denies.Dysuria,denies paresthesias, denies extremity pain or ulcers.Marland Kitchendenies chest pain. has had an annual eye exam. do check the feet. Does check CBGs. Average WUJ:[id number] Denies episodes of hypoglycemia. Does have an emergency hypoglycemic plan. admits toCompliance with medications. Denies Problems with medications.  Past Medical History  Diagnosis Date  . Hypothyroid   . Hypertension   . Goiter   . Obese   . Osteopenia   . Fatigue   . Gastric polyp   . Diverticulosis   . Rosacea   . Postmenopausal   . Vitamin D deficiency   . Hyperlipidemia   . Diabetes mellitus without complication   . 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 Abdominal hysterectomy  1985  . Foot surgery Right 08/2002   History   Social History  . Marital Status: Divorced    Spouse Name: N/A    Number of Children: N/A  . Years of Education: N/A   Occupational History  . Not on file.   Social History Main Topics  . Smoking status: Never Smoker   . Smokeless tobacco: Not on file  . Alcohol Use: No  . Drug Use: No  . Sexual Activity: Not on file   Other Topics Concern  . Not on file   Social History Narrative  . No narrative on file   Family History  Problem Relation Age of Onset  . Osteoporosis Mother    Current Outpatient Prescriptions on File Prior to Visit  Medication Sig Dispense Refill  . alendronate (FOSAMAX) 70 MG tablet Take 1 tablet (70 mg total) by mouth every 7 (seven) days. Take with a full glass of water on an empty stomach.  4 tablet  11  . amLODipine (NORVASC) 5 MG  tablet Take 1 tablet (5 mg total) by mouth daily.  30 tablet  5  . aspirin 81 MG tablet Take 81 mg by mouth daily.      Marland Kitchen glucose blood (ONE TOUCH ULTRA TEST) test strip Use to check BG daily Dx:250.02  100 each  2  . lisinopril (PRINIVIL,ZESTRIL) 10 MG tablet Take 10 mg by mouth daily.      . metFORMIN (GLUCOPHAGE) 1000 MG tablet Take 1 tablet (1,000 mg total) by mouth 2 (two) times daily with a meal.  60 tablet  5  . ONETOUCH DELICA LANCETS 33G MISC 1 each by Does not apply route daily. Use to check BG daily Dx:  250.02  100 each  2  . pravastatin (PRAVACHOL) 20 MG tablet Take 1 tablet (20 mg total) by mouth at bedtime.  30 tablet  3   No current facility-administered medications on file prior to visit.   Allergies  Allergen Reactions  . Crestor [Rosuvastatin]     Cramps  . Lipitor [Atorvastatin]     Cramps   Immunization History  Administered Date(s) Administered  . Influenza Whole 05/16/2012  . Influenza,inj,Quad PF,36+ Mos 03/01/2013  . Pneumococcal Polysaccharide 11/29/2005  . Td 09/14/2002  . Zoster 04/17/2011   Prior to Admission medications   Medication Sig Start Date End Date Taking? Authorizing Provider  alendronate (FOSAMAX) 70 MG tablet Take 1 tablet (70 mg total) by mouth every 7 (seven) days. Take with a full glass of water on an empty stomach. 12/02/12   Ileana Ladd, MD  amLODipine (NORVASC) 5 MG tablet Take 1 tablet (5 mg total) by mouth daily. 01/22/13   Ileana Ladd, MD  aspirin 81 MG tablet Take 81 mg by mouth daily.    Historical Provider, MD  glucose blood (ONE TOUCH ULTRA TEST) test strip Use to check BG daily Dx:250.02 10/19/12   Tammy Eckard, PHARMD  lisinopril (PRINIVIL,ZESTRIL) 10 MG tablet Take 10 mg by mouth daily.    Historical Provider, MD  metFORMIN (GLUCOPHAGE) 1000 MG tablet Take 1 tablet (1,000 mg total) by mouth 2 (two) times daily with a meal. 10/22/12   Ileana Ladd, MD  [facility] DELICA LANCETS 33G MISC 1 each by Does not apply route daily.  Use to check BG daily Dx:  250.02 10/19/12   Tammy Eckard, PHARMD  pravastatin (PRAVACHOL) 20 MG tablet Take 1 tablet (20 mg total) by mouth at bedtime. 01/13/13   Ileana Ladd, MD     ROS: As above in the HPI. All other systems are stable or negative.  OBJECTIVE: APPEARANCE:  Patient in no acute distress.The patient appeared well nourished and normally developed. Acyanotic. Waist: VITAL SIGNS:BP 154/82  Pulse 69  Temp(Src) 97.5 F (36.4 C) (Oral)  Ht 5\' 4"  (1.626 m)  Wt 184 lb 12.8 oz (83.825 kg)  BMI 31.71 kg/m2  WF BP 130/75 rechecked   SKIN: warm and  Dry without overt rashes, tattoos and scars  HEAD and Neck: without JVD, Head and scalp: normal Eyes:No scleral icterus. Fundi normal, eye movements normal. Ears: Auricle normal, canal normal, Tympanic membranes normal, insufflation normal. Nose: normal Throat: normal Neck & thyroid: normal  CHEST & LUNGS: Chest wall: normal Lungs: Clear  CVS: Reveals the PMI to be normally located. Regular rhythm, First and Second Heart sounds are normal,  absence of murmurs, rubs or gallops. Peripheral vasculature: Radial pulses: normal Dorsal pedis pulses: normal Posterior pulses: normal  ABDOMEN:  Appearance: mildly obese Benign, no organomegaly, no masses, no Abdominal Aortic enlargement. No Guarding , no rebound. No Bruits. Bowel sounds: normal  RECTAL: N/A GU: N/A  EXTREMETIES: nonedematous.  MUSCULOSKELETAL:  Spine: normal Joints: intact  NEUROLOGIC: oriented to time,place and person; nonfocal. Strength is normal Sensory is normal Reflexes are normal Cranial Nerves are normal.  ASSESSMENT: Stroke  Type II or unspecified type diabetes mellitus without mention of complication, not stated as uncontrolled - Plan: POCT glycosylated hemoglobin (Hb A1C), CMP14+EGFR  Unspecified essential hypertension - Plan: CMP14+EGFR  Hypothyroid  Obesity, unspecified  Osteoporosis  Other and unspecified  hyperlipidemia - Plan: NMR, lipoprofile  Unspecified vitamin D deficiency - Plan: Vit D  25 hydroxy (rtn osteoporosis monitoring)  Patient is s table and  Doing well.  PLAN:      Dr Woodroe Mode Recommendations  For nutrition information, I recommend books:  1).Eat to Live by Dr Monico Hoar. 2).Prevent and Reverse Heart Disease by Dr Suzzette Righter. 3) Dr Katherina Right Book:  Program to Reverse Diabetes  Exercise recommendations are:  If unable to walk, then the patient can exercise in a chair 3 times a day. By flapping arms like a bird gently and raising legs outwards to the front.  If ambulatory, the patient can go for walks for 30 minutes 3 times a week. Then increase the intensity and duration as tolerated.  Goal is to try to attain exercise frequency to 5 times a week.  If applicable: Best to perform resistance exercises (machines or weights) 2 days a week and cardio type exercises 3 days per week.  Orders Placed This Encounter  Procedures  . CMP14+EGFR  . NMR, lipoprofile  . Vit D  25 hydroxy (rtn osteoporosis monitoring)  . POCT glycosylated hemoglobin (Hb A1C)  no change in medicatipons. No orders of the defined types were placed in this encounter.   There are no discontinued medications. Return in about 4 months (around 09/03/2013) for Recheck medical problems.  Wendelyn Kiesling P. [place] Charon, M.D.

## 2013-05-06 NOTE — Patient Instructions (Signed)
      Dr Leeana Creer's Recommendations  For nutrition information, I recommend books:  1).Eat to Live by Dr Joel Fuhrman. 2).Prevent and Reverse Heart Disease by Dr Caldwell Esselstyn. 3) Dr Neal Barnard's Book:  Program to Reverse Diabetes  Exercise recommendations are:  If unable to walk, then the patient can exercise in a chair 3 times a day. By flapping arms like a bird gently and raising legs outwards to the front.  If ambulatory, the patient can go for walks for 30 minutes 3 times a week. Then increase the intensity and duration as tolerated.  Goal is to try to attain exercise frequency to 5 times a week.  If applicable: Best to perform resistance exercises (machines or weights) 2 days a week and cardio type exercises 3 days per week.  

## 2013-05-08 LAB — NMR, LIPOPROFILE
Cholesterol: 180 mg/dL (ref <200)
HDL Cholesterol by NMR: 47 mg/dL (ref >=40)
HDL Particle Number: 33.9 umol/L (ref >=30.5)
LDL Particle Number: 1658 nmol/L — ABNORMAL HIGH (ref <1000)
LDL Size: 20.4 nm — ABNORMAL LOW (ref >20.5)
LDLC SERPL CALC-MCNC: 109 mg/dL — ABNORMAL HIGH (ref <100)
LP-IR Score: 67 — ABNORMAL HIGH (ref <=45)
Small LDL Particle Number: 889 nmol/L — ABNORMAL HIGH (ref <=527)
Triglycerides by NMR: 120 mg/dL (ref <150)

## 2013-05-08 LAB — CMP14+EGFR
ALT: 21 IU/L (ref 0–32)
AST: 17 IU/L (ref 0–40)
Albumin/Globulin Ratio: 1.9 (ref 1.1–2.5)
Albumin: 4.3 g/dL (ref 3.5–4.8)
Alkaline Phosphatase: 66 IU/L (ref 39–117)
BUN/Creatinine Ratio: 24 (ref 11–26)
BUN: 14 mg/dL (ref 8–27)
CO2: 23 mmol/L (ref 18–29)
Calcium: 9.9 mg/dL (ref 8.6–10.2)
Chloride: 101 mmol/L (ref 97–108)
Creatinine, Ser: 0.59 mg/dL (ref 0.57–1.00)
GFR calc Af Amer: 106 mL/min/{1.73_m2} (ref >59)
GFR calc non Af Amer: 92 mL/min/{1.73_m2} (ref >59)
Globulin, Total: 2.3 g/dL (ref 1.5–4.5)
Glucose: 82 mg/dL (ref 65–99)
Potassium: 4.2 mmol/L (ref 3.5–5.2)
Sodium: 140 mmol/L (ref 134–144)
Total Bilirubin: <0.2 mg/dL (ref 0.0–1.2)
Total Protein: 6.6 g/dL (ref 6.0–8.5)

## 2013-05-08 LAB — VITAMIN D 25 HYDROXY (VIT D DEFICIENCY, FRACTURES): Vit D, 25-Hydroxy: 18 ng/mL — ABNORMAL LOW (ref 30.0–100.0)

## 2013-05-09 ENCOUNTER — Other Ambulatory Visit: Payer: Self-pay | Admitting: Family Medicine

## 2013-05-09 MED ORDER — PRAVASTATIN SODIUM 40 MG PO TABS: 40.0000 mg | ORAL_TABLET | Freq: Every day | ORAL | Status: DC

## 2013-05-11 ENCOUNTER — Ambulatory Visit: Payer: Medicare Other | Admitting: Family Medicine

## 2013-05-17 ENCOUNTER — Other Ambulatory Visit: Payer: Self-pay | Admitting: Family Medicine

## 2013-09-07 ENCOUNTER — Other Ambulatory Visit: Payer: Self-pay | Admitting: Family Medicine

## 2013-09-09 NOTE — Telephone Encounter (Signed)
Do not see med on current med list. Please advise 

## 2013-09-09 NOTE — Telephone Encounter (Signed)
Patient needs to be seen. Patient has exceeded limit since last visit. Refill denied.she was supposed to start on Vitamin D 2,000 units daily from November and have labs by now. If she has not strated on 2,000 units  She needs to . The 50,000 units is a loading dose. Bring all medications at next office visit.

## 2013-09-10 ENCOUNTER — Ambulatory Visit (INDEPENDENT_AMBULATORY_CARE_PROVIDER_SITE_OTHER): Payer: Medicare Other | Admitting: Family Medicine

## 2013-09-10 ENCOUNTER — Encounter: Payer: Self-pay | Admitting: Family Medicine

## 2013-09-10 VITALS — BP 155/81 | HR 76 | Temp 97.1°F | Ht 63.0 in | Wt 187.4 lb

## 2013-09-10 DIAGNOSIS — E559 Vitamin D deficiency, unspecified: Secondary | ICD-10-CM

## 2013-09-10 DIAGNOSIS — I639 Cerebral infarction, unspecified: Secondary | ICD-10-CM

## 2013-09-10 DIAGNOSIS — E785 Hyperlipidemia, unspecified: Secondary | ICD-10-CM

## 2013-09-10 DIAGNOSIS — I6350 Cerebral infarction due to unspecified occlusion or stenosis of unspecified cerebral artery: Secondary | ICD-10-CM

## 2013-09-10 DIAGNOSIS — E039 Hypothyroidism, unspecified: Secondary | ICD-10-CM

## 2013-09-10 DIAGNOSIS — I10 Essential (primary) hypertension: Secondary | ICD-10-CM

## 2013-09-10 DIAGNOSIS — E669 Obesity, unspecified: Secondary | ICD-10-CM

## 2013-09-10 DIAGNOSIS — M810 Age-related osteoporosis without current pathological fracture: Secondary | ICD-10-CM

## 2013-09-10 DIAGNOSIS — E119 Type 2 diabetes mellitus without complications: Secondary | ICD-10-CM

## 2013-09-10 LAB — POCT UA - MICROALBUMIN: Microalbumin Ur, POC: NEGATIVE mg/L

## 2013-09-10 LAB — POCT GLYCOSYLATED HEMOGLOBIN (HGB A1C): Hemoglobin A1C: 6.1

## 2013-09-10 MED ORDER — LISINOPRIL 20 MG PO TABS: 20.0000 mg | ORAL_TABLET | Freq: Every day | ORAL | Status: DC

## 2013-09-10 NOTE — Patient Instructions (Addendum)
    Dr Duglas Heier's Recommendations  For nutrition information, I recommend books:  1).Eat to Live by Dr Joel Fuhrman. 2).Prevent and Reverse Heart Disease by Dr Caldwell Esselstyn. 3) Dr Neal Barnard's Book:  Program to Reverse Diabetes  Exercise recommendations are:  If unable to walk, then the patient can exercise in a chair 3 times a day. By flapping arms like a bird gently and raising legs outwards to the front.  If ambulatory, the patient can go for walks for 30 minutes 3 times a week. Then increase the intensity and duration as tolerated.  Goal is to try to attain exercise frequency to 5 times a week.  If applicable: Best to perform resistance exercises (machines or weights) 2 days a week and cardio type exercises 3 days per week.   Diabetes and Foot Care Diabetes may cause you to have problems because of poor blood supply (circulation) to your feet and legs. This may cause the skin on your feet to become thinner, break easier, and heal more slowly. Your skin may become dry, and the skin may peel and crack. You may also have nerve damage in your legs and feet causing decreased feeling in them. You may not notice minor injuries to your feet that could lead to infections or more serious problems. Taking care of your feet is one of the most important things you can do for yourself.  HOME CARE INSTRUCTIONS  Wear shoes at all times, even in the house. Do not go barefoot. Bare feet are easily injured.  Check your feet daily for blisters, cuts, and redness. If you cannot see the bottom of your feet, use a mirror or ask someone for help.  Wash your feet with warm water (do not use hot water) and mild soap. Then pat your feet and the areas between your toes until they are completely dry. Do not soak your feet as this can dry your skin.  Apply a moisturizing lotion or petroleum jelly (that does not contain alcohol and is unscented) to the skin on your feet and to dry, brittle toenails.  Do not apply lotion between your toes.  Trim your toenails straight across. Do not dig under them or around the cuticle. File the edges of your nails with an emery board or nail file.  Do not cut corns or calluses or try to remove them with medicine.  Wear clean socks or stockings every day. Make sure they are not too tight. Do not wear knee-high stockings since they may decrease blood flow to your legs.  Wear shoes that fit properly and have enough cushioning. To break in new shoes, wear them for just a few hours a day. This prevents you from injuring your feet. Always look in your shoes before you put them on to be sure there are no objects inside.  Do not cross your legs. This may decrease the blood flow to your feet.  If you find a minor scrape, cut, or break in the skin on your feet, keep it and the skin around it clean and dry. These areas may be cleansed with mild soap and water. Do not cleanse the area with peroxide, alcohol, or iodine.  When you remove an adhesive bandage, be sure not to damage the skin around it.  If you have a wound, look at it several times a day to make sure it is healing.  Do not use heating pads or hot water bottles. They may burn your skin. If   you have lost feeling in your feet or legs, you may not know it is happening until it is too late.  Make sure your health care provider performs a complete foot exam at least annually or more often if you have foot problems. Report any cuts, sores, or bruises to your health care provider immediately. SEEK MEDICAL CARE IF:   You have an injury that is not healing.  You have cuts or breaks in the skin.  You have an ingrown nail.  You notice redness on your legs or feet.  You feel burning or tingling in your legs or feet.  You have pain or cramps in your legs and feet.  Your legs or feet are numb.  Your feet always feel cold. SEEK IMMEDIATE MEDICAL CARE IF:   There is increasing redness, swelling, or pain in  or around a wound.  There is a red line that goes up your leg.  Pus is coming from a wound.  You develop a fever or as directed by your health care provider.  You notice a bad smell coming from an ulcer or wound. Document Released: 06/14/2000 Document Revised: 02/17/2013 Document Reviewed: 11/24/2012 [facility] Patient Information 2014 [place].   Hypercholesterolemia High Blood Cholesterol Cholesterol is a white, waxy, fat-like protein needed by your body in small amounts. The liver makes all the cholesterol you need. It is carried from the liver by the blood through the blood vessels. Deposits (plaque) may build up on blood vessel walls. This makes the arteries narrower and stiffer. Plaque increases the risk for heart attack and stroke. You cannot feel your cholesterol level even if it is very high. The only way to know is by a blood test to check your lipid (fats) levels. Once you know your cholesterol levels, you should keep a record of the test results. Work with your caregiver to to keep your levels in the desired range. WHAT THE RESULTS MEAN:  Total cholesterol is a rough measure of all the cholesterol in your blood.  LDL is the so-called bad cholesterol. This is the type that deposits cholesterol in the walls of the arteries. You want this level to be low.  HDL is the good cholesterol because it cleans the arteries and carries the LDL away. You want this level to be high.  Triglycerides are fat that the body can either burn for energy or store. High levels are closely linked to heart disease. DESIRED LEVELS:  Total cholesterol below 200.  LDL below 100 for people at risk, below 70 for very high risk.  HDL above 50 is good, above 60 is best.  Triglycerides below 150. HOW TO LOWER YOUR CHOLESTEROL:  Diet.  Choose fish or white meat chicken and [place], roasted or baked. Limit fatty cuts of red meat, fried foods, and processed meats, such as sausage and lunch  meat.  Eat lots of fresh fruits and vegetables. Choose whole grains, beans, pasta, potatoes and cereals.  Use only small amounts of olive, corn or canola oils. Avoid butter, mayonnaise, shortening or palm kernel oils. Avoid foods with trans-fats.  Use skim/nonfat milk and low-fat/nonfat yogurt and cheeses. Avoid whole milk, cream, ice cream, egg yolks and cheeses. Healthy desserts include angel food cake, gingersnaps, animal crackers, hard candy, popsicles, and low-fat/nonfat frozen yogurt. Avoid pastries, cakes, pies and cookies.  Exercise.  A regular program helps decrease LDL and raises HDL.  Helps with weight control.  Do things that increase your activity level like gardening, walking, or  taking the stairs.  Medication.  May be prescribed by your caregiver to help lowering cholesterol and the risk for heart disease.  You may need medicine even if your levels are normal if you have several risk factors. HOME CARE INSTRUCTIONS   Follow your diet and exercise programs as suggested by your caregiver.  Take medications as directed.  Have blood work done when your caregiver feels it is necessary. MAKE SURE YOU:   Understand these instructions.  Will watch your condition.  Will get help right away if you are not doing well or get worse. Document Released: 06/17/2005 Document Revised: 09/09/2011 Document Reviewed: 12/03/2006 [facility] Patient Information 2014 [place].   Hypertension As your heart beats, it forces blood through your arteries. This force is your blood pressure. If the pressure is too high, it is called hypertension (HTN) or high blood pressure. HTN is dangerous because you may have it and not know it. High blood pressure may mean that your heart has to work harder to pump blood. Your arteries may be narrow or stiff. The extra work puts you at risk for heart disease, stroke, and other problems.  Blood pressure consists of two numbers, a higher number over a  lower, 110/72, for example. It is stated as "110 over 72." The ideal is below 120 for the top number (systolic) and under 80 for the bottom (diastolic). Write down your blood pressure today. You should pay close attention to your blood pressure if you have certain conditions such as:  Heart failure.  Prior heart attack.  Diabetes  Chronic kidney disease.  Prior stroke.  Multiple risk factors for heart disease. To see if you have HTN, your blood pressure should be measured while you are seated with your arm held at the level of the heart. It should be measured at least twice. A one-time elevated blood pressure reading (especially in the Emergency Department) does not mean that you need treatment. There may be conditions in which the blood pressure is different between your right and left arms. It is important to see your caregiver soon for a recheck. Most people have essential hypertension which means that there is not a specific cause. This type of high blood pressure may be lowered by changing lifestyle factors such as:  Stress.  Smoking.  Lack of exercise.  Excessive weight.  Drug/tobacco/alcohol use.  Eating less salt. Most people do not have symptoms from high blood pressure until it has caused damage to the body. Effective treatment can often prevent, delay or reduce that damage. TREATMENT  When a cause has been identified, treatment for high blood pressure is directed at the cause. There are a large number of medications to treat HTN. These fall into several categories, and your caregiver will help you select the medicines that are best for you. Medications may have side effects. You should review side effects with your caregiver. If your blood pressure stays high after you have made lifestyle changes or started on medicines,   Your medication(s) may need to be changed.  Other problems may need to be addressed.  Be certain you understand your prescriptions, and know how and  when to take your medicine.  Be sure to follow up with your caregiver within the time frame advised (usually within two weeks) to have your blood pressure rechecked and to review your medications.  If you are taking more than one medicine to lower your blood pressure, make sure you know how and at what times they  should be taken. Taking two medicines at the same time can result in blood pressure that is too low. SEEK IMMEDIATE MEDICAL CARE IF:  You develop a severe headache, blurred or changing vision, or confusion.  You have unusual weakness or numbness, or a faint feeling.  You have severe chest or abdominal pain, vomiting, or breathing problems. MAKE SURE YOU:   Understand these instructions.  Will watch your condition.  Will get help right away if you are not doing well or get worse. Document Released: 06/17/2005 Document Revised: 09/09/2011 Document Reviewed: 02/05/2008 [facility] Patient Information 2014 [place].

## 2013-09-10 NOTE — Progress Notes (Signed)
Patient ID: Sheila Ray, female   DOB: 01/16/1941, 73 y.o.   MRN: [id number] SUBJECTIVE: CC: Chief Complaint  Patient presents with  . Follow-up    4 month follow up chronic problems    HPI: Patient is here for follow up of Diabetes Mellitus/HTN/hypothyroid/obese: Symptoms evaluated: Denies Nocturia ,Denies Urinary Frequency , denies Blurred vision ,deniesDizziness,denies.Dysuria,denies paresthesias, denies extremity pain or ulcers.Marland Kitchendenies chest pain. has had an annual eye exam. do check the feet. Does check CBGs. Average CBG:120s Denies episodes of hypoglycemia. Does have an emergency hypoglycemic plan. admits toCompliance with medications. Denies Problems with medications.  Past Medical History  Diagnosis Date  . Hypothyroid   . Hypertension   . Goiter   . Obese   . Osteopenia   . Fatigue   . Gastric polyp   . Diverticulosis   . Rosacea   . Postmenopausal   . Vitamin D deficiency   . Hyperlipidemia   . Diabetes mellitus without complication   . 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 Abdominal hysterectomy  1985  . Foot surgery Right 08/2002   History   Social History  . Marital Status: Divorced    Spouse Name: N/A    Number of Children: N/A  . Years of Education: N/A   Occupational History  . Not on file.   Social History Main Topics  . Smoking status: Never Smoker   . Smokeless tobacco: Not on file  . Alcohol Use: No  . Drug Use: No  . Sexual Activity: Not on file   Other Topics Concern  . Not on file   Social History Narrative  . No narrative on file   Family History  Problem Relation Age of Onset  . Osteoporosis Mother    Current Outpatient Prescriptions on File Prior to Visit  Medication Sig Dispense Refill  . amLODipine (NORVASC) 5 MG tablet Take 1 tablet (5 mg total) by mouth daily.  30 tablet  5  . aspirin 81 MG tablet Take 81 mg by mouth daily.      Marland Kitchen glucose  blood (ONE TOUCH ULTRA TEST) test strip Use to check BG daily Dx:250.02  100 each  2  . metFORMIN (GLUCOPHAGE) 1000 MG tablet TAKE ONE TABLET BY MOUTH TWICE A DAY WITH MEALS  60 tablet  2  . ONETOUCH DELICA LANCETS 33G MISC 1 each by Does not apply route daily. Use to check BG daily Dx:  250.02  100 each  2  . pravastatin (PRAVACHOL) 40 MG tablet Take 1 tablet (40 mg total) by mouth at bedtime.  30 tablet  3   No current facility-administered medications on file prior to visit.   Allergies  Allergen Reactions  . Crestor [Rosuvastatin]     Cramps  . Lipitor [Atorvastatin]     Cramps   Immunization History  Administered Date(s) Administered  . Influenza Whole 05/16/2012  . Influenza,inj,Quad PF,36+ Mos 03/01/2013  . Pneumococcal Polysaccharide-23 11/29/2005  . Td 09/14/2002  . Zoster 04/17/2011   Prior to Admission medications   Medication Sig Start Date End Date Taking? Authorizing Provider  amLODipine (NORVASC) 5 MG tablet Take 1 tablet (5 mg total) by mouth daily. 01/22/13  Yes Ileana Ladd, MD  aspirin 81 MG tablet Take 81 mg by mouth daily.   Yes Historical Provider, MD  glucose blood (ONE TOUCH ULTRA TEST) test strip Use to check BG daily Dx:250.02 10/19/12  Yes Tammy Eckard, PHARMD  lisinopril (PRINIVIL,ZESTRIL) 10 MG tablet Take 10 mg by mouth daily.   Yes Historical Provider, MD  metFORMIN (GLUCOPHAGE) 1000 MG tablet TAKE ONE TABLET BY MOUTH TWICE A DAY WITH MEALS 05/17/13  Yes Ileana Ladd, MD  [facility] DELICA LANCETS 33G MISC 1 each by Does not apply route daily. Use to check BG daily Dx:  250.02 10/19/12  Yes Tammy Eckard, PHARMD  pravastatin (PRAVACHOL) 40 MG tablet Take 1 tablet (40 mg total) by mouth at bedtime. 05/09/13  Yes Ileana Ladd, MD     ROS: As above in the HPI. All other systems are stable or negative.  OBJECTIVE: APPEARANCE:  Patient in no acute distress.The patient appeared well nourished and normally developed. Acyanotic. Waist: VITAL  SIGNS:BP 155/81  Pulse 76  Temp(Src) 97.1 F (36.2 C) (Oral)  Ht 5\' 3"  (1.6 m)  Wt 187 lb 6.4 oz (85.004 kg)  BMI 33.20 kg/m2 WF Obese  SKIN: warm and  Dry without overt rashes, tattoos and scars  HEAD and Neck: without JVD, Head and scalp: normal Eyes:No scleral icterus. Fundi normal, eye movements normal. Ears: Auricle normal, canal normal, Tympanic membranes normal, insufflation normal. Nose: normal Throat: normal Neck & thyroid: normal  CHEST & LUNGS: Chest wall: normal Lungs: Clear  CVS: Reveals the PMI to be normally located. Regular rhythm, First and Second Heart sounds are normal,  absence of murmurs, rubs or gallops. Peripheral vasculature: Radial pulses: normal Dorsal pedis pulses: normal Posterior pulses: normal  ABDOMEN:  Appearance: Obese Benign, no organomegaly, no masses, no Abdominal Aortic enlargement. No Guarding , no rebound. No Bruits. Bowel sounds: normal  RECTAL: N/A GU: N/A  EXTREMETIES: nonedematous.  MUSCULOSKELETAL:  Spine: normal Joints: intact  NEUROLOGIC: oriented to time,place and person; nonfocal. Strength is normal Sensory is normal Reflexes are normal Cranial Nerves are normal.  Results for orders placed in visit on 05/06/13  CMP14+EGFR      Result Value Ref Range   Glucose 82  65 - 99 mg/dL   BUN 14  8 - 27 mg/dL   Creatinine, Ser 7.82  0.57 - 1.00 mg/dL   GFR calc non Af Amer 92  >59 mL/min/1.73   GFR calc Af Amer 106  >59 mL/min/1.73   BUN/Creatinine Ratio 24  11 - 26   Sodium 140  134 - 144 mmol/L   Potassium 4.2  3.5 - 5.2 mmol/L   Chloride 101  97 - 108 mmol/L   CO2 23  18 - 29 mmol/L   Calcium 9.9  8.6 - 10.2 mg/dL   Total Protein 6.6  6.0 - 8.5 g/dL   Albumin 4.3  3.5 - 4.8 g/dL   Globulin, Total 2.3  1.5 - 4.5 g/dL   Albumin/Globulin Ratio 1.9  1.1 - 2.5   Total Bilirubin <0.2  0.0 - 1.2 mg/dL   Alkaline Phosphatase 66  39 - 117 IU/L   AST 17  0 - 40 IU/L   ALT 21  0 - 32 IU/L  NMR, LIPOPROFILE       Result Value Ref Range   LDL Particle Number [id number] (*) <1000 nmol/L   LDLC SERPL CALC-MCNC 109 (*) <100 mg/dL   HDL Cholesterol by NMR 47  >=40 mg/dL   Triglycerides by NMR 120  <150 mg/dL   Cholesterol 956  <213 mg/dL   HDL Particle Number [id number]  >=57.8 umol/L   Small LDL Particle Number [id number] (*) <=527 nmol/L   LDL Size 20.4 (*) >  20.5 nm   LP-IR Score 67 (*) <=45  VITAMIN D 25 HYDROXY      Result Value Ref Range   Vit D, 25-Hydroxy 18.0 (*) 30.0 - 100.0 ng/mL  POCT GLYCOSYLATED HEMOGLOBIN (HGB A1C)      Result Value Ref Range   Hemoglobin A1C 5.8%      ASSESSMENT: Unspecified essential hypertension - Plan: CMP14+EGFR, lisinopril (PRINIVIL,ZESTRIL) 20 MG tablet  Type II or unspecified type diabetes mellitus without mention of complication, not stated as uncontrolled - Plan: POCT glycosylated hemoglobin (Hb A1C), POCT UA - Microalbumin, CMP14+EGFR  Stroke  Other and unspecified hyperlipidemia - Plan: Lipid panel  Osteoporosis - Plan: Vit D  25 hydroxy (rtn osteoporosis monitoring)  Obesity, unspecified  Hypothyroid - Plan: TSH  Unspecified vitamin D deficiency - Plan: Vit D  25 hydroxy (rtn osteoporosis monitoring) BP not at goal Will adjust lisinopril. Increased from 10 mg to 20 mg.  PLAN: Handouts in the AVS       Dr Woodroe Mode Recommendations  For nutrition information, I recommend books:  1).Eat to Live by Dr Monico Hoar. 2).Prevent and Reverse Heart Disease by Dr Suzzette Righter. 3) Dr Katherina Right Book:  Program to Reverse Diabetes  Exercise recommendations are:  If unable to walk, then the patient can exercise in a chair 3 times a day. By flapping arms like a bird gently and raising legs outwards to the front.  If ambulatory, the patient can go for walks for 30 minutes 3 times a week. Then increase the intensity and duration as tolerated.  Goal is to try to attain exercise frequency to 5 times a week.  If applicable: Best to perform resistance  exercises (machines or weights) 2 days a week and cardio type exercises 3 days per week.  Orders Placed This Encounter  Procedures  . CMP14+EGFR  . Lipid panel  . Vit D  25 hydroxy (rtn osteoporosis monitoring)  . TSH  . POCT glycosylated hemoglobin (Hb A1C)  . POCT UA - Microalbumin   Meds ordered this encounter  Medications  . lisinopril (PRINIVIL,ZESTRIL) 20 MG tablet    Sig: Take 1 tablet (20 mg total) by mouth daily.    Dispense:  30 tablet    Refill:  5   Medications Discontinued During This Encounter  Medication Reason  . alendronate (FOSAMAX) 70 MG tablet Discontinued by provider  . lisinopril (PRINIVIL,ZESTRIL) 10 MG tablet Reorder   Return in about 4 weeks (around 10/08/2013) for Recheck medical problems, recheck BP.  Daisie Haft P. [place] Charon, M.D.

## 2013-09-11 LAB — CMP14+EGFR
ALT: 16 IU/L (ref 0–32)
AST: 15 IU/L (ref 0–40)
Albumin/Globulin Ratio: 2 (ref 1.1–2.5)
Albumin: 4.5 g/dL (ref 3.5–4.8)
Alkaline Phosphatase: 60 IU/L (ref 39–117)
BUN/Creatinine Ratio: 24 (ref 11–26)
BUN: 16 mg/dL (ref 8–27)
CO2: 25 mmol/L (ref 18–29)
Calcium: 10 mg/dL (ref 8.7–10.3)
Chloride: 100 mmol/L (ref 97–108)
Creatinine, Ser: 0.67 mg/dL (ref 0.57–1.00)
GFR calc Af Amer: 102 mL/min/{1.73_m2} (ref >59)
GFR calc non Af Amer: 88 mL/min/{1.73_m2} (ref >59)
Globulin, Total: 2.2 g/dL (ref 1.5–4.5)
Glucose: 116 mg/dL — ABNORMAL HIGH (ref 65–99)
Potassium: 5.5 mmol/L — ABNORMAL HIGH (ref 3.5–5.2)
Sodium: 141 mmol/L (ref 134–144)
Total Bilirubin: 0.3 mg/dL (ref 0.0–1.2)
Total Protein: 6.7 g/dL (ref 6.0–8.5)

## 2013-09-11 LAB — TSH: TSH: 1.76 u[IU]/mL (ref 0.450–4.500)

## 2013-09-11 LAB — LIPID PANEL
Chol/HDL Ratio: 4.1 ratio units (ref 0.0–4.4)
Cholesterol, Total: 227 mg/dL — ABNORMAL HIGH (ref 100–199)
HDL: 55 mg/dL (ref >39)
LDL Calculated: 150 mg/dL — ABNORMAL HIGH (ref 0–99)
Triglycerides: 111 mg/dL (ref 0–149)
VLDL Cholesterol Cal: 22 mg/dL (ref 5–40)

## 2013-09-11 LAB — VITAMIN D 25 HYDROXY (VIT D DEFICIENCY, FRACTURES): Vit D, 25-Hydroxy: 25.8 ng/mL — ABNORMAL LOW (ref 30.0–100.0)

## 2013-10-15 ENCOUNTER — Ambulatory Visit: Payer: Medicare Other | Admitting: Family Medicine

## 2013-10-31 ENCOUNTER — Other Ambulatory Visit: Payer: Self-pay | Admitting: Family Medicine

## 2013-11-05 ENCOUNTER — Other Ambulatory Visit: Payer: Self-pay | Admitting: Family Medicine

## 2013-12-10 ENCOUNTER — Other Ambulatory Visit: Payer: Self-pay | Admitting: *Deleted

## 2013-12-10 MED ORDER — AMLODIPINE BESYLATE 5 MG PO TABS: 5.0000 mg | ORAL_TABLET | Freq: Every day | ORAL | Status: DC

## 2013-12-13 ENCOUNTER — Other Ambulatory Visit: Payer: Self-pay | Admitting: Family Medicine

## 2014-01-13 ENCOUNTER — Ambulatory Visit: Payer: Medicare Other | Admitting: Family Medicine

## 2014-02-28 ENCOUNTER — Encounter: Payer: Self-pay | Admitting: Family

## 2014-02-28 ENCOUNTER — Ambulatory Visit (INDEPENDENT_AMBULATORY_CARE_PROVIDER_SITE_OTHER): Payer: Medicare Other | Admitting: Family

## 2014-02-28 VITALS — BP 148/80 | HR 72 | Temp 98.1°F | Ht 63.0 in | Wt 190.8 lb

## 2014-02-28 DIAGNOSIS — E559 Vitamin D deficiency, unspecified: Secondary | ICD-10-CM

## 2014-02-28 DIAGNOSIS — R635 Abnormal weight gain: Secondary | ICD-10-CM

## 2014-02-28 DIAGNOSIS — E785 Hyperlipidemia, unspecified: Secondary | ICD-10-CM

## 2014-02-28 DIAGNOSIS — I10 Essential (primary) hypertension: Secondary | ICD-10-CM

## 2014-02-28 DIAGNOSIS — E119 Type 2 diabetes mellitus without complications: Secondary | ICD-10-CM

## 2014-02-28 LAB — POCT GLYCOSYLATED HEMOGLOBIN (HGB A1C): Hemoglobin A1C: 6.5

## 2014-02-28 MED ORDER — LISINOPRIL-HYDROCHLOROTHIAZIDE 20-12.5 MG PO TABS: 1.0000 | ORAL_TABLET | Freq: Every day | ORAL | Status: DC

## 2014-02-28 MED ORDER — METFORMIN HCL 1000 MG PO TABS: ORAL_TABLET | ORAL | Status: DC

## 2014-02-28 MED ORDER — AMLODIPINE BESYLATE 5 MG PO TABS: 5.0000 mg | ORAL_TABLET | Freq: Every day | ORAL | Status: DC

## 2014-02-28 MED ORDER — PRAVASTATIN SODIUM 40 MG PO TABS: 40.0000 mg | ORAL_TABLET | Freq: Every day | ORAL | Status: DC

## 2014-02-28 NOTE — Patient Instructions (Signed)

## 2014-02-28 NOTE — Progress Notes (Signed)
Subjective:    Patient ID: Sheila Ray, female    DOB: Aug 03, 1940, 73 y.o.   MRN: [id number]  Diabetes She presents for her follow-up diabetic visit. She has type 2 diabetes mellitus. Her disease course has been stable. Pertinent negatives for hypoglycemia include no confusion, dizziness or headaches. Pertinent negatives for diabetes include no blurred vision, no foot paresthesias, no foot ulcerations and no visual change. There are no hypoglycemic complications. Pertinent negatives for hypoglycemia complications include no blackouts. Diabetic complications include a CVA. Pertinent negatives for diabetic complications include no heart disease, nephropathy, peripheral neuropathy or PVD. Risk factors for coronary artery disease include dyslipidemia, diabetes mellitus, hypertension, obesity, sedentary lifestyle and post-menopausal. Current diabetic treatment includes oral agent (monotherapy). She is compliant with treatment all of the time. She is following a generally healthy diet. Her breakfast blood glucose range is generally 110-130 mg/dl. An ACE inhibitor/angiotensin II receptor blocker is being taken. Eye exam is current.  Hypertension This is a chronic problem. The problem has been waxing and waning since onset. The problem is uncontrolled. Pertinent negatives include no blurred vision, headaches, palpitations, peripheral edema or shortness of breath. Risk factors for coronary artery disease include diabetes mellitus, dyslipidemia, obesity and post-menopausal state. Past treatments include ACE inhibitors and beta blockers. The current treatment provides mild improvement. Hypertensive end-organ damage includes CVA. There is no history of kidney disease, CAD/MI, heart failure or PVD. There is no history of sleep apnea.  Hyperlipidemia This is a chronic problem. The current episode started more than 1 year ago. The problem is uncontrolled. Recent lipid tests were reviewed and are high. Exacerbating  diseases include diabetes. Factors aggravating her hyperlipidemia include fatty foods. Pertinent negatives include no shortness of breath. Current antihyperlipidemic treatment includes statins. The current treatment provides mild improvement of lipids. Risk factors for coronary artery disease include diabetes mellitus, hypertension and obesity.      Review of Systems  Constitutional: Negative.   HENT: Negative.   Eyes: Negative.  Negative for blurred vision.  Respiratory: Negative.  Negative for shortness of breath.   Cardiovascular: Negative.  Negative for palpitations.  Gastrointestinal: Negative.   Endocrine: Negative.   Genitourinary: Negative.   Musculoskeletal: Negative.   Neurological: Negative.  Negative for dizziness and headaches.  Hematological: Negative.   Psychiatric/Behavioral: Negative.  Negative for confusion.  All other systems reviewed and are negative.      Objective:   Physical Exam  Vitals reviewed. Constitutional: She is oriented to person, place, and time. She appears well-developed and well-nourished. No distress.  HENT:  Head: Normocephalic and atraumatic.  Right Ear: External ear normal.  Mouth/Throat: Oropharynx is clear and moist.  Eyes: Pupils are equal, round, and reactive to light.  Neck: Normal range of motion. Neck supple. No thyromegaly present.  Cardiovascular: Normal rate, regular rhythm, normal heart sounds and intact distal pulses.   No murmur heard. Pulmonary/Chest: Effort normal and breath sounds normal. No respiratory distress. She has no wheezes.  Abdominal: Soft. Bowel sounds are normal. She exhibits no distension. There is no tenderness.  Musculoskeletal: Normal range of motion. She exhibits no edema and no tenderness.  Neurological: She is alert and oriented to person, place, and time. She has normal reflexes. No cranial nerve deficit.  Skin: Skin is warm and dry.  Psychiatric: She has a normal mood and affect. Her behavior is  normal. Judgment and thought content normal.     BP 148/80  Pulse 72  Temp(Src) 98.1 F (36.7 C) (Oral)  Ht 5' 3"  (1.6 m)  Wt 190 lb 12.8 oz (86.546 kg)  BMI 33.81 kg/m2      Assessment & Plan:  1. Essential hypertension - CMP14+EGFR - lisinopril-hydrochlorothiazide (ZESTORETIC) 20-12.5 MG per tablet; Take 1 tablet by mouth daily.  Dispense: 90 tablet; Refill: 3 - amLODipine (NORVASC) 5 MG tablet; Take 1 tablet (5 mg total) by mouth daily.  Dispense: 90 tablet; Refill: 3  2. Type 2 diabetes mellitus without complication - POCT glycosylated hemoglobin (Hb A1C) - CMP14+EGFR - metFORMIN (GLUCOPHAGE) 1000 MG tablet; TAKE ONE TABLET BY MOUTH with breaksfast  Dispense: 90 tablet; Refill: 3  3. HLD (hyperlipidemia) - CMP14+EGFR - Lipid panel - pravastatin (PRAVACHOL) 40 MG tablet; Take 1 tablet (40 mg total) by mouth at bedtime.  Dispense: 90 tablet; Refill: 3  4. Unspecified vitamin D deficiency - CMP14+EGFR - Vit D  25 hydroxy (rtn osteoporosis monitoring)  5. Weight gain - Thyroid Panel With TSH   Continue all meds Labs pending Health Maintenance reviewed Diet and exercise encouraged RTO 2 weeks for blood pressure check  [name], FNP

## 2014-03-01 LAB — CMP14+EGFR
ALT: 18 IU/L (ref 0–32)
AST: 18 IU/L (ref 0–40)
Albumin/Globulin Ratio: 1.8 (ref 1.1–2.5)
Albumin: 4.2 g/dL (ref 3.5–4.8)
Alkaline Phosphatase: 66 IU/L (ref 39–117)
BUN/Creatinine Ratio: 32 — ABNORMAL HIGH (ref 11–26)
BUN: 25 mg/dL (ref 8–27)
CO2: 23 mmol/L (ref 18–29)
Calcium: 9.8 mg/dL (ref 8.7–10.3)
Chloride: 98 mmol/L (ref 97–108)
Creatinine, Ser: 0.77 mg/dL (ref 0.57–1.00)
GFR calc Af Amer: 89 mL/min/{1.73_m2} (ref >59)
GFR calc non Af Amer: 77 mL/min/{1.73_m2} (ref >59)
Globulin, Total: 2.3 g/dL (ref 1.5–4.5)
Glucose: 97 mg/dL (ref 65–99)
Potassium: 4.3 mmol/L (ref 3.5–5.2)
Sodium: 138 mmol/L (ref 134–144)
Total Bilirubin: 0.4 mg/dL (ref 0.0–1.2)
Total Protein: 6.5 g/dL (ref 6.0–8.5)

## 2014-03-01 LAB — THYROID PANEL WITH TSH
Free Thyroxine Index: 2.9 (ref 1.2–4.9)
T3 Uptake Ratio: 27 % (ref 24–39)
T4, Total: 10.6 ug/dL (ref 4.5–12.0)
TSH: 1.58 u[IU]/mL (ref 0.450–4.500)

## 2014-03-01 LAB — VITAMIN D 25 HYDROXY (VIT D DEFICIENCY, FRACTURES): Vit D, 25-Hydroxy: 18.6 ng/mL — ABNORMAL LOW (ref 30.0–100.0)

## 2014-03-01 LAB — LIPID PANEL
Chol/HDL Ratio: 4.1 ratio units (ref 0.0–4.4)
Cholesterol, Total: 180 mg/dL (ref 100–199)
HDL: 44 mg/dL (ref >39)
LDL Calculated: 112 mg/dL — ABNORMAL HIGH (ref 0–99)
Triglycerides: 118 mg/dL (ref 0–149)
VLDL Cholesterol Cal: 24 mg/dL (ref 5–40)

## 2014-03-03 ENCOUNTER — Other Ambulatory Visit: Payer: Self-pay | Admitting: Family

## 2014-03-03 MED ORDER — PRAVASTATIN SODIUM 80 MG PO TABS: 80.0000 mg | ORAL_TABLET | Freq: Every day | ORAL | Status: DC

## 2014-03-03 MED ORDER — VITAMIN D (ERGOCALCIFEROL) 1.25 MG (50000 UNIT) PO CAPS: 50000.0000 [IU] | ORAL_CAPSULE | ORAL | Status: DC

## 2014-03-18 ENCOUNTER — Encounter: Payer: Self-pay | Admitting: Family

## 2014-03-18 ENCOUNTER — Ambulatory Visit (INDEPENDENT_AMBULATORY_CARE_PROVIDER_SITE_OTHER): Payer: Medicare Other | Admitting: Family

## 2014-03-18 VITALS — BP 143/78 | HR 67 | Temp 97.5°F | Ht 63.0 in | Wt 190.2 lb

## 2014-03-18 DIAGNOSIS — E119 Type 2 diabetes mellitus without complications: Secondary | ICD-10-CM

## 2014-03-18 DIAGNOSIS — I10 Essential (primary) hypertension: Secondary | ICD-10-CM

## 2014-03-18 MED ORDER — LISINOPRIL 40 MG PO TABS: 40.0000 mg | ORAL_TABLET | Freq: Every day | ORAL | Status: DC

## 2014-03-18 NOTE — Progress Notes (Signed)
   Subjective:    Patient ID: Sheila Ray, female    DOB: 11-10-1940, 73 y.o.   MRN: [id number]  HPI Pt presents to the office for follow-up on hypertension. Pt was started on HCTZ last visit. However, pt was given a combo pil of lisinopril-hydrochlorothiazde and pt states she has been having leg cramps since starting it and has been "breaking the pill in half a day". Pt told that meant she was only getting half of lisinopril and HCTZ.    Review of Systems  Constitutional: Negative.   HENT: Negative.   Eyes: Negative.   Respiratory: Negative.   Cardiovascular: Negative.   Gastrointestinal: Negative.   Endocrine: Negative.   Genitourinary: Negative.   Musculoskeletal: Negative.   Neurological: Negative.   Hematological: Negative.   Psychiatric/Behavioral: Negative.   All other systems reviewed and are negative.      Objective:   Physical Exam  Vitals reviewed. Constitutional: She is oriented to person, place, and time. She appears well-developed and well-nourished. No distress.  HENT:  Head: Normocephalic and atraumatic.  Right Ear: External ear normal.  Left Ear: External ear normal.  Nose: Nose normal.  Mouth/Throat: Oropharynx is clear and moist.  Eyes: Pupils are equal, round, and reactive to light.  Neck: Normal range of motion. Neck supple. No thyromegaly present.  Cardiovascular: Normal rate, regular rhythm, normal heart sounds and intact distal pulses.   No murmur heard. Pulmonary/Chest: Effort normal and breath sounds normal. No respiratory distress. She has no wheezes.  Abdominal: Soft. Bowel sounds are normal. She exhibits no distension. There is no tenderness.  Musculoskeletal: Normal range of motion. She exhibits no edema and no tenderness.  Neurological: She is alert and oriented to person, place, and time. She has normal reflexes. No cranial nerve deficit.  Skin: Skin is warm and dry.  Psychiatric: She has a normal mood and affect. Her behavior is normal.  Judgment and thought content normal.   BP 143/78  Pulse 67  Temp(Src) 97.5 F (36.4 C) (Oral)  Ht 5\' 3"  (1.6 m)  Wt 190 lb 3.2 oz (86.274 kg)  BMI 33.70 kg/m2        Assessment & Plan:  1. Essential hypertension -Dash diet information given -Exercise encouraged - Stress Management  -Continue current meds -RTO in 2 weeks - lisinopril (PRINIVIL,ZESTRIL) 40 MG tablet; Take 1 tablet (40 mg total) by mouth daily.  Dispense: 90 tablet; Refill: 3  2. Type 2 diabetes mellitus without complication  Continue all meds Labs discussed Health Maintenance reviewed Diet and exercise encouraged RTO 2 weeks for recheck blood pressure  [name], FNP

## 2014-03-18 NOTE — Patient Instructions (Addendum)
Hypertension Hypertension, commonly called high blood pressure, is when the force of blood pumping through your arteries is too strong. Your arteries are the blood vessels that carry blood from your heart throughout your body. A blood pressure reading consists of a higher number over a lower number, such as 110/72. The higher number (systolic) is the pressure inside your arteries when your heart pumps. The lower number (diastolic) is the pressure inside your arteries when your heart relaxes. Ideally you want your blood pressure below 120/80. Hypertension forces your heart to work harder to pump blood. Your arteries may become narrow or stiff. Having hypertension puts you at risk for heart disease, stroke, and other problems.  RISK FACTORS Some risk factors for high blood pressure are controllable. Others are not.  Risk factors you cannot control include:   Race. You may be at higher risk if you are African American.  Age. Risk increases with age.  Gender. Men are at higher risk than women before age 45 years. After age 65, women are at higher risk than men. Risk factors you can control include:  Not getting enough exercise or physical activity.  Being overweight.  Getting too much fat, sugar, calories, or salt in your diet.  Drinking too much alcohol. SIGNS AND SYMPTOMS Hypertension does not usually cause signs or symptoms. Extremely high blood pressure (hypertensive crisis) may cause headache, anxiety, shortness of breath, and nosebleed. DIAGNOSIS  To check if you have hypertension, your health care provider will measure your blood pressure while you are seated, with your arm held at the level of your heart. It should be measured at least twice using the same arm. Certain conditions can cause a difference in blood pressure between your right and left arms. A blood pressure reading that is higher than normal on one occasion does not mean that you need treatment. If one blood pressure reading  is high, ask your health care provider about having it checked again. TREATMENT  Treating high blood pressure includes making lifestyle changes and possibly taking medicine. Living a healthy lifestyle can help lower high blood pressure. You may need to change some of your habits. Lifestyle changes may include:  Following the DASH diet. This diet is high in fruits, vegetables, and whole grains. It is low in salt, red meat, and added sugars.  Getting at least 2 hours of brisk physical activity every week.  Losing weight if necessary.  Not smoking.  Limiting alcoholic beverages.  Learning ways to reduce stress. If lifestyle changes are not enough to get your blood pressure under control, your health care provider may prescribe medicine. You may need to take more than one. Work closely with your health care provider to understand the risks and benefits. HOME CARE INSTRUCTIONS  Have your blood pressure rechecked as directed by your health care provider.   Take medicines only as directed by your health care provider. Follow the directions carefully. Blood pressure medicines must be taken as prescribed. The medicine does not work as well when you skip doses. Skipping doses also puts you at risk for problems.   Do not smoke.   Monitor your blood pressure at home as directed by your health care provider. SEEK MEDICAL CARE IF:   You think you are having a reaction to medicines taken.  You have recurrent headaches or feel dizzy.  You have swelling in your ankles.  You have trouble with your vision. SEEK IMMEDIATE MEDICAL CARE IF:  You develop a severe headache or confusion.    You have unusual weakness, numbness, or feel faint.  You have severe chest or abdominal pain.  You vomit repeatedly.  You have trouble breathing. MAKE SURE YOU:   Understand these instructions.  Will watch your condition.  Will get help right away if you are not doing well or get worse. Document  Released: 06/17/2005 Document Revised: 11/01/2013 Document Reviewed: 04/09/2013 ExitCare Patient Information 2015 [employer]. This information is not intended to replace advice given to you by your health care provider. Make sure you discuss any questions you have with your health care provider. DASH Eating Plan DASH stands for "Dietary Approaches to Stop Hypertension." The DASH eating plan is a healthy eating plan that has been shown to reduce high blood pressure (hypertension). Additional health benefits may include reducing the risk of type 2 diabetes mellitus, heart disease, and stroke. The DASH eating plan may also help with weight loss. WHAT DO I NEED TO KNOW ABOUT THE DASH EATING PLAN? For the DASH eating plan, you will follow these general guidelines:  Choose foods with a percent daily value for sodium of less than 5% (as listed on the food label).  Use salt-free seasonings or herbs instead of table salt or sea salt.  Check with your health care provider or pharmacist before using salt substitutes.  Eat lower-sodium products, often labeled as "lower sodium" or "no salt added."  Eat fresh foods.  Eat more vegetables, fruits, and low-fat dairy products.  Choose whole grains. Look for the word "whole" as the first word in the ingredient list.  Choose fish and skinless chicken or turkey more often than red meat. Limit fish, poultry, and meat to 6 oz (170 g) each day.  Limit sweets, desserts, sugars, and sugary drinks.  Choose heart-healthy fats.  Limit cheese to 1 oz (28 g) per day.  Eat more home-cooked food and less restaurant, buffet, and fast food.  Limit fried foods.  Cook foods using methods other than frying.  Limit canned vegetables. If you do use them, rinse them well to decrease the sodium.  When eating at a restaurant, ask that your food be prepared with less salt, or no salt if possible. WHAT FOODS CAN I EAT? Seek help from a dietitian for individual  calorie needs. Grains Whole grain or whole wheat bread. Brown rice. Whole grain or whole wheat pasta. Quinoa, bulgur, and whole grain cereals. Low-sodium cereals. Corn or whole wheat flour tortillas. Whole grain cornbread. Whole grain crackers. Low-sodium crackers. Vegetables Fresh or frozen vegetables (raw, steamed, roasted, or grilled). Low-sodium or reduced-sodium tomato and vegetable juices. Low-sodium or reduced-sodium tomato sauce and paste. Low-sodium or reduced-sodium canned vegetables.  Fruits All fresh, canned (in natural juice), or frozen fruits. Meat and Other Protein Products Ground beef (85% or leaner), grass-fed beef, or beef trimmed of fat. Skinless chicken or turkey. Ground chicken or turkey. Pork trimmed of fat. All fish and seafood. Eggs. Dried beans, peas, or lentils. Unsalted nuts and seeds. Unsalted canned beans. Dairy Low-fat dairy products, such as skim or 1% milk, 2% or reduced-fat cheeses, low-fat ricotta or cottage cheese, or plain low-fat yogurt. Low-sodium or reduced-sodium cheeses. Fats and Oils Tub margarines without trans fats. Light or reduced-fat mayonnaise and salad dressings (reduced sodium). Avocado. Safflower, olive, or canola oils. Natural peanut or almond butter. Other Unsalted popcorn and pretzels. The items listed above may not be a complete list of recommended foods or beverages. Contact your dietitian for more options. WHAT FOODS ARE NOT RECOMMENDED? Grains White bread.   White pasta. White rice. Refined cornbread. Bagels and croissants. Crackers that contain trans fat. Vegetables Creamed or fried vegetables. Vegetables in a cheese sauce. Regular canned vegetables. Regular canned tomato sauce and paste. Regular tomato and vegetable juices. Fruits Dried fruits. Canned fruit in light or heavy syrup. Fruit juice. Meat and Other Protein Products Fatty cuts of meat. Ribs, chicken wings, bacon, sausage, bologna, salami, chitterlings, fatback, hot dogs,  bratwurst, and packaged luncheon meats. Salted nuts and seeds. Canned beans with salt. Dairy Whole or 2% milk, cream, half-and-half, and cream cheese. Whole-fat or sweetened yogurt. Full-fat cheeses or blue cheese. Nondairy creamers and whipped toppings. Processed cheese, cheese spreads, or cheese curds. Condiments Onion and garlic salt, seasoned salt, table salt, and sea salt. Canned and packaged gravies. Worcestershire sauce. Tartar sauce. Barbecue sauce. Teriyaki sauce. Soy sauce, including reduced sodium. Steak sauce. Fish sauce. Oyster sauce. Cocktail sauce. Horseradish. Ketchup and mustard. Meat flavorings and tenderizers. Bouillon cubes. Hot sauce. Tabasco sauce. Marinades. Taco seasonings. Relishes. Fats and Oils Butter, stick margarine, lard, shortening, ghee, and bacon fat. Coconut, palm kernel, or palm oils. Regular salad dressings. Other Pickles and olives. Salted popcorn and pretzels. The items listed above may not be a complete list of foods and beverages to avoid. Contact your dietitian for more information. WHERE CAN I FIND MORE INFORMATION? National Heart, Lung, and [employer] Institute: www.nhlbi.nih.gov/health/health-topics/topics/dash/ Document Released: 06/06/2011 Document Revised: 11/01/2013 Document Reviewed: 04/21/2013 ExitCare Patient Information 2015 [employer]. This information is not intended to replace advice given to you by your health care provider. Make sure you discuss any questions you have with your health care provider.  

## 2014-03-24 ENCOUNTER — Other Ambulatory Visit: Payer: Self-pay | Admitting: Family

## 2014-04-06 ENCOUNTER — Ambulatory Visit (INDEPENDENT_AMBULATORY_CARE_PROVIDER_SITE_OTHER): Payer: Medicare Other | Admitting: Family

## 2014-04-06 ENCOUNTER — Ambulatory Visit (INDEPENDENT_AMBULATORY_CARE_PROVIDER_SITE_OTHER): Payer: Medicare Other

## 2014-04-06 ENCOUNTER — Encounter: Payer: Self-pay | Admitting: Family

## 2014-04-06 VITALS — BP 121/65 | HR 71 | Temp 97.6°F | Ht 63.0 in | Wt 190.4 lb

## 2014-04-06 DIAGNOSIS — I10 Essential (primary) hypertension: Secondary | ICD-10-CM

## 2014-04-06 DIAGNOSIS — R252 Cramp and spasm: Secondary | ICD-10-CM

## 2014-04-06 DIAGNOSIS — Z23 Encounter for immunization: Secondary | ICD-10-CM

## 2014-04-06 NOTE — Progress Notes (Signed)
   Subjective:    Patient ID: Sheila Ray, female    DOB: 09/01/1940, 73 y.o.   MRN: [id number]  Hypertension Pertinent negatives include no headaches, palpitations or shortness of breath.   Pt presents to the office for follow-up on hypertension. Pt's BP is at goal today. Pt denies any headache, SOB, palpitations, or edema. Pt's only complaint is leg cramps.    Review of Systems  Constitutional: Negative.   HENT: Negative.   Eyes: Negative.   Respiratory: Negative.  Negative for shortness of breath.   Cardiovascular: Negative.  Negative for palpitations.  Gastrointestinal: Negative.   Endocrine: Negative.   Genitourinary: Negative.   Musculoskeletal: Negative.   Neurological: Negative.  Negative for headaches.  Hematological: Negative.   Psychiatric/Behavioral: Negative.   All other systems reviewed and are negative.      Objective:   Physical Exam  Vitals reviewed. Constitutional: She is oriented to person, place, and time. She appears well-developed and well-nourished. No distress.  Eyes: Pupils are equal, round, and reactive to light.  Neck: Normal range of motion. Neck supple. No thyromegaly present.  Cardiovascular: Normal rate, regular rhythm, normal heart sounds and intact distal pulses.   No murmur heard. Pulmonary/Chest: Effort normal and breath sounds normal. No respiratory distress. She has no wheezes.  Abdominal: Soft. Bowel sounds are normal. She exhibits no distension. There is no tenderness.  Musculoskeletal: Normal range of motion. She exhibits no edema and no tenderness.  Neurological: She is alert and oriented to person, place, and time. She has normal reflexes. No cranial nerve deficit.  Skin: Skin is warm and dry.  Psychiatric: She has a normal mood and affect. Her behavior is normal. Judgment and thought content normal.    BP 121/65  Pulse 71  Temp(Src) 97.6 F (36.4 C) (Oral)  Ht 5\' 3"  (1.6 m)  Wt 190 lb 6.4 oz (86.365 kg)  BMI 33.74  kg/m2       Assessment & Plan:  1. Essential hypertension -Dash diet information discussed -Exercise encouraged - Stress Management  -Continue current meds -RTO in 6 months for chronic follow-up  2. Cramp of both lower extremities - Magnesium  [name], FNP

## 2014-04-06 NOTE — Patient Instructions (Signed)
Leg Cramps Leg cramps that occur during exercise can be caused by poor circulation or dehydration. However, muscle cramps that occur at rest or during the night are usually not due to any serious medical problem. Heat cramps may cause muscle spasms during hot weather.  CAUSES There is no clear cause for muscle cramps. However, dehydration may be a factor for those who do not drink enough fluids and those who exercise in the heat. Imbalances in the level of sodium, potassium, calcium or magnesium in the muscle tissue may also be a factor. Some medications, such as water pills (diuretics), may cause loss of chemicals that the body needs (like sodium and potassium) and cause muscle cramps. TREATMENT   Make sure your diet has enough fluids and essential minerals for the muscle to work normally.  Avoid strenuous exercise for several days if you have been having frequent leg cramps.  Stretch and massage the cramped muscle for several minutes.  Some medicines may be helpful in some patients with night cramps. Only take over-the-counter or prescription medicines as directed by your caregiver. SEEK IMMEDIATE MEDICAL CARE IF:   Your leg cramps become worse.  Your foot becomes cold, numb, or blue. Document Released: 07/25/2004 Document Revised: 09/09/2011 Document Reviewed: 07/12/2008 ExitCare Patient Information 2015 [employer]. This information is not intended to replace advice given to you by your health care provider. Make sure you discuss any questions you have with your health care provider.  

## 2014-04-07 LAB — MAGNESIUM: Magnesium: 1.9 mg/dL (ref 1.6–2.6)

## 2014-04-13 ENCOUNTER — Telehealth: Payer: Self-pay | Admitting: Family Medicine

## 2014-04-13 NOTE — Telephone Encounter (Signed)
Message copied by Waverly Ferrari on Wed Apr 13, 2014 11:22 AM ------      Message from: [place], [place] A      Created: Fri Apr 08, 2014  9:47 AM       Magnesium WNL ------

## 2014-04-18 ENCOUNTER — Telehealth: Payer: Self-pay | Admitting: *Deleted

## 2014-04-18 NOTE — Telephone Encounter (Signed)
Aware, appointment on 06-16-14 for surgery clearance.

## 2014-06-16 ENCOUNTER — Ambulatory Visit (INDEPENDENT_AMBULATORY_CARE_PROVIDER_SITE_OTHER): Payer: Medicare Other

## 2014-06-16 ENCOUNTER — Ambulatory Visit: Payer: Medicare Other | Admitting: Nurse Practitioner

## 2014-06-16 ENCOUNTER — Encounter: Payer: Self-pay | Admitting: Nurse Practitioner

## 2014-06-16 ENCOUNTER — Ambulatory Visit (INDEPENDENT_AMBULATORY_CARE_PROVIDER_SITE_OTHER): Payer: Medicare Other | Admitting: Nurse Practitioner

## 2014-06-16 VITALS — BP 173/74 | HR 73 | Temp 97.8°F | Ht 63.0 in | Wt 190.2 lb

## 2014-06-16 DIAGNOSIS — Z01818 Encounter for other preprocedural examination: Secondary | ICD-10-CM

## 2014-06-16 IMAGING — CR DG CHEST 2V
2 series · 2 of 2 positions shown · non-contrast
Comparison: None.

CLINICAL DATA: Preoperative evaluation for knee replacement

EXAM:
CHEST  2 VIEW

[view not recorded (1 of 2)]
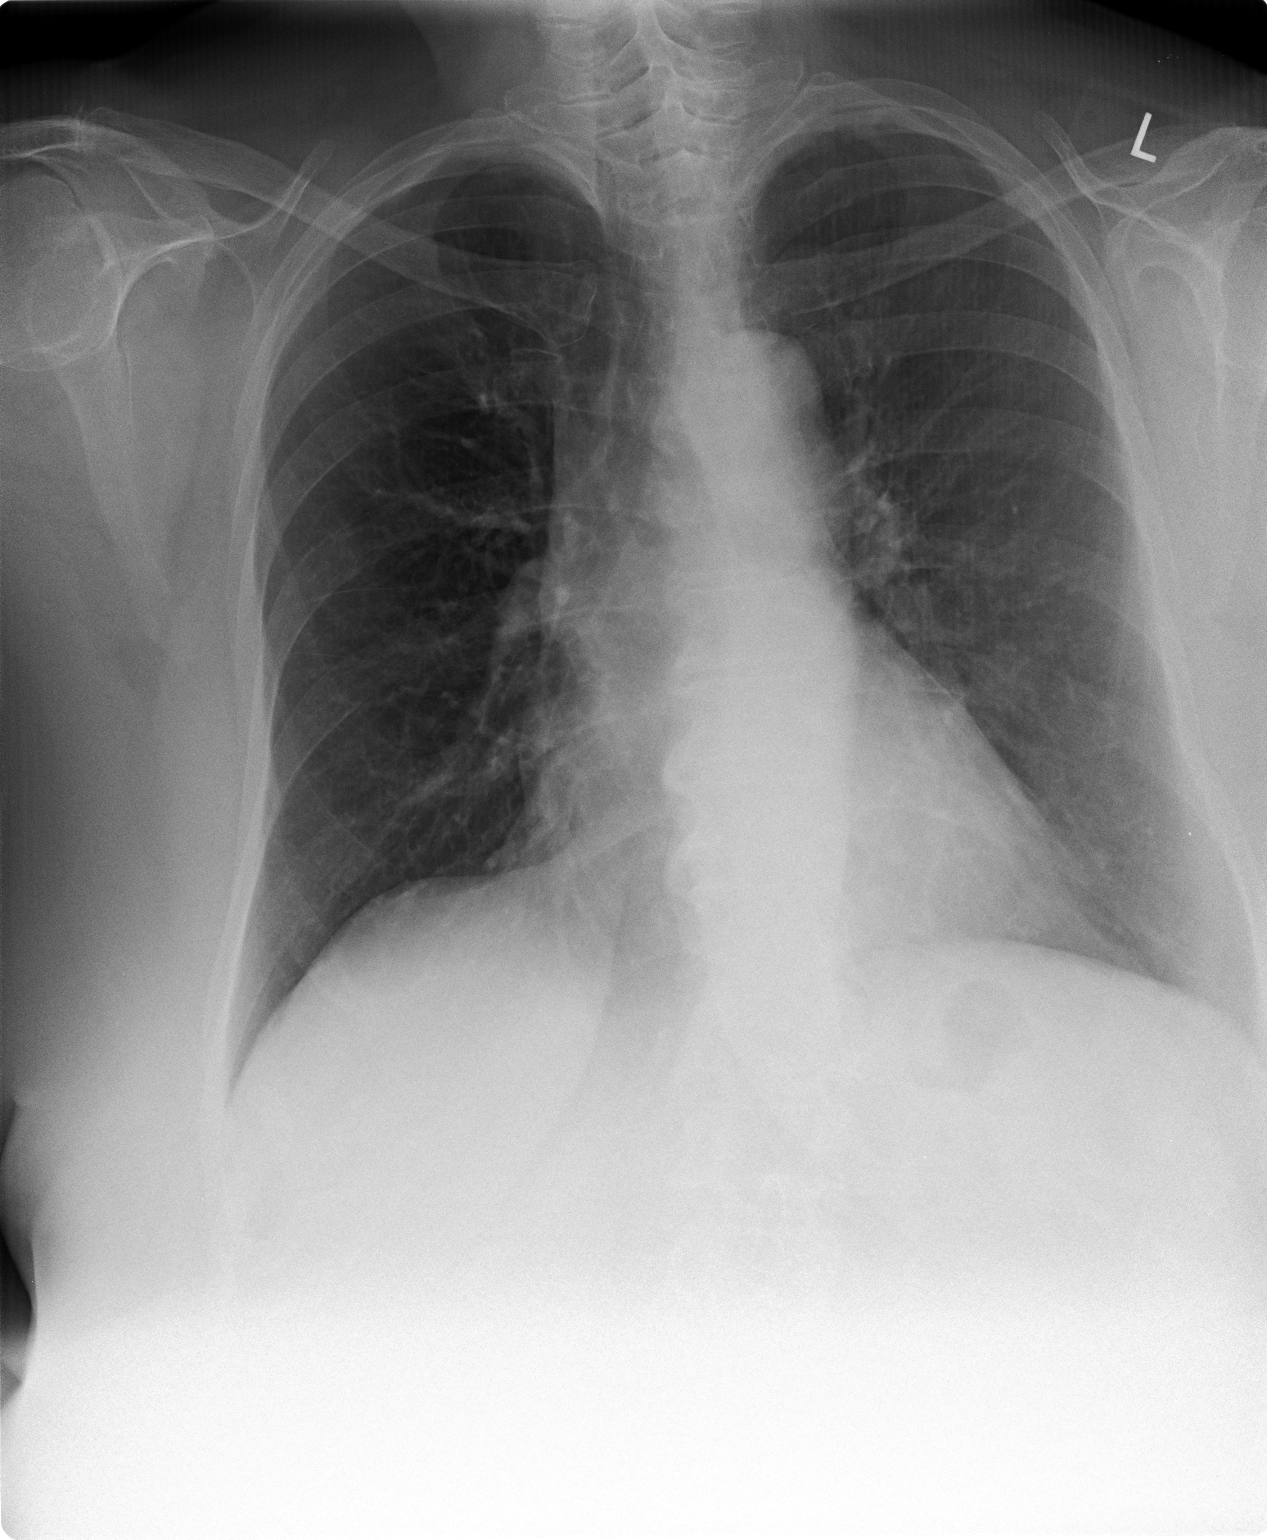

[view not recorded (2 of 2)]
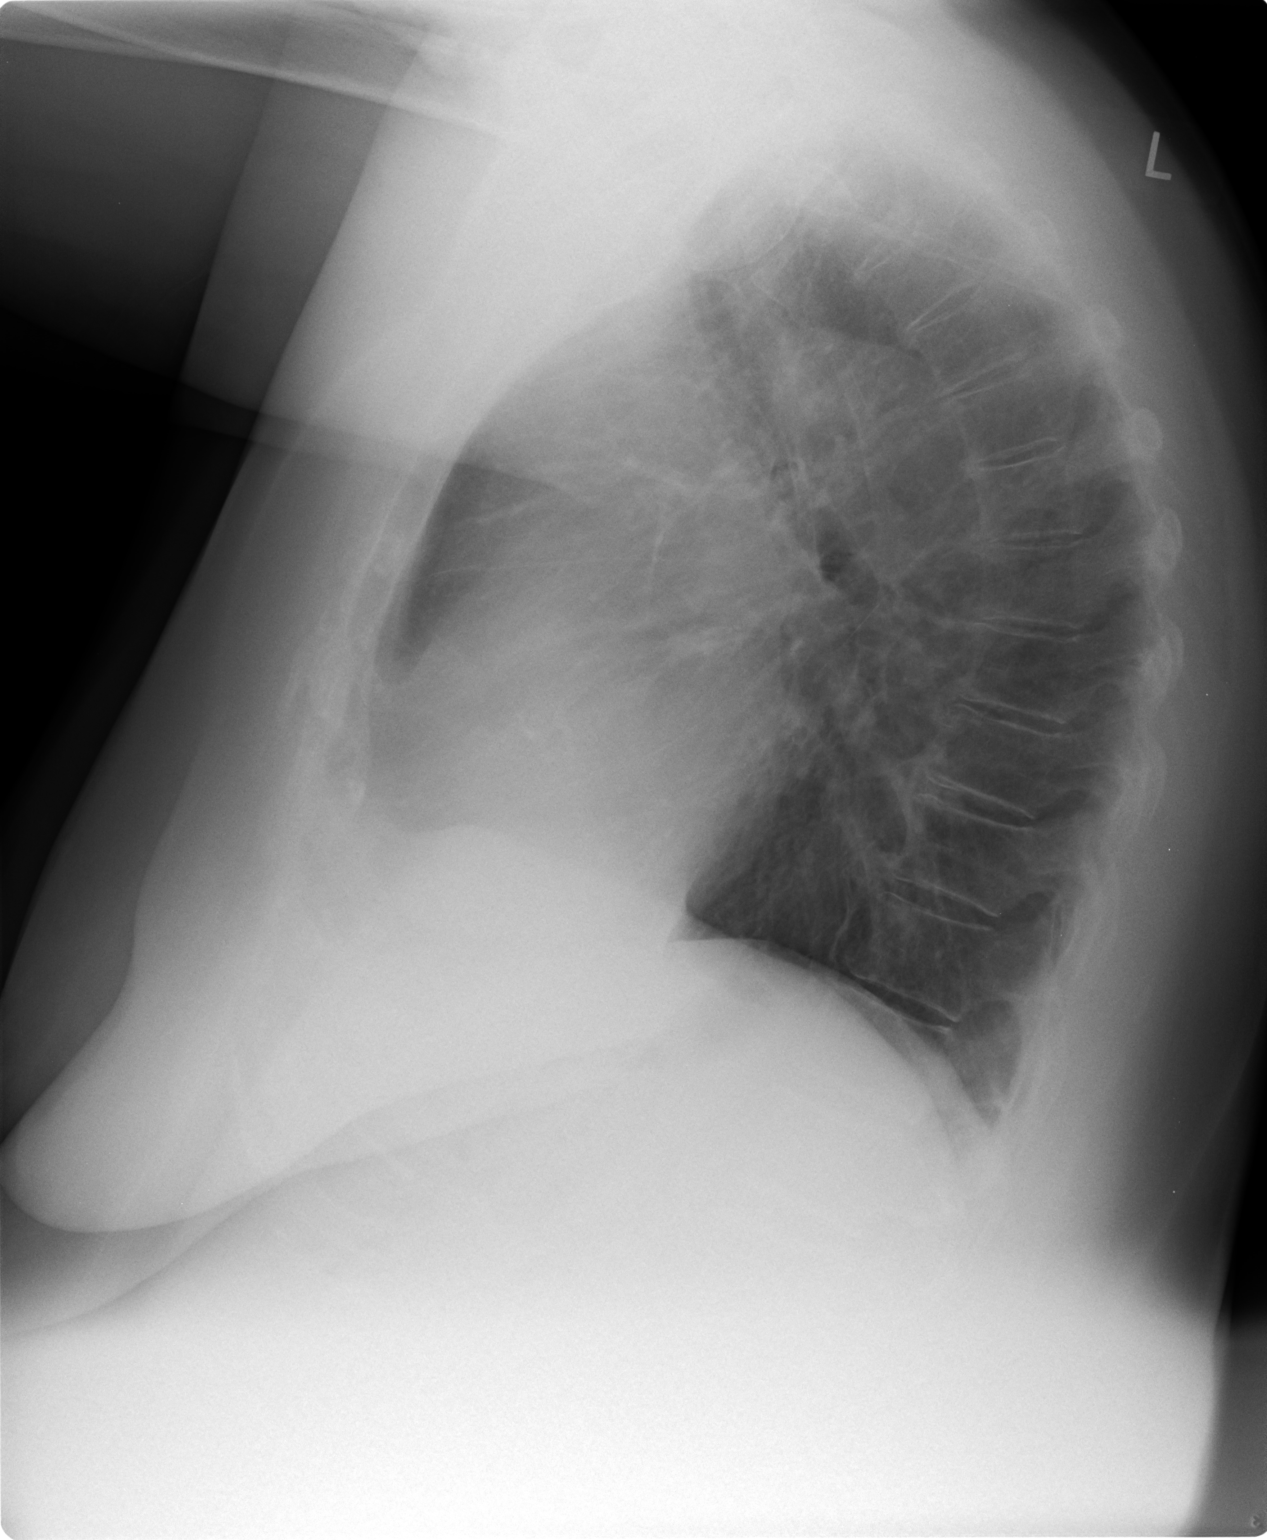

[2 of 2 positions shown; findings below may reference images not displayed]

FINDINGS: The heart size and mediastinal contours are within normal limits.
Both lungs are clear. The visualized skeletal structures show
degenerative change of the thoracic spine.
IMPRESSION: No active cardiopulmonary disease.

## 2014-06-16 NOTE — Progress Notes (Signed)
   Subjective:    Patient ID: Sheila Ray, female    DOB: March 08, 1941, 73 y.o.   MRN: [id number]  HPI Patient in today for surgical clearance- she is scheduled for Total Knee Replacement. Sh eis doing well without complaints.    Review of Systems  Constitutional: Negative.   HENT: Negative.   Respiratory: Negative.   Cardiovascular: Negative.   Genitourinary: Negative.   Neurological: Negative.   Psychiatric/Behavioral: Negative.   All other systems reviewed and are negative.      Objective:   Physical Exam  Constitutional: She is oriented to person, place, and time. She appears well-developed and well-nourished.  Cardiovascular: Normal rate.   Murmur (2/6 heard loudest at aortic valve) heard. Pulmonary/Chest: Effort normal and breath sounds normal.  Neurological: She is alert and oriented to person, place, and time.  Skin: Skin is warm and dry.  Psychiatric: She has a normal mood and affect. Her behavior is normal. Judgment and thought content normal.    BP 173/74 mmHg  Pulse 73  Temp(Src) 97.8 F (36.6 C) (Oral)  Ht 5\' 3"  (1.6 m)  Wt 190 lb 3.2 oz (86.274 kg)  BMI 33.70 kg/m2   Chest x ray- chronic bronchitic chanages- Preliminary reading by Ronnald Collum, FNP  [facility]   EKG-NSR- Mary-Margaret Hassell Done, FNP       Assessment & Plan:   1. Preoperative clearance    Cleared for surgery RTO prn  Mary-Margaret Hassell Done, FNP

## 2014-06-27 ENCOUNTER — Telehealth: Payer: Self-pay | Admitting: Nurse Practitioner

## 2014-06-27 DIAGNOSIS — R9431 Abnormal electrocardiogram [ECG] [EKG]: Secondary | ICD-10-CM

## 2014-06-27 NOTE — Telephone Encounter (Signed)
Cardiology referral made

## 2014-06-28 ENCOUNTER — Telehealth: Payer: Self-pay | Admitting: Cardiovascular Disease

## 2014-06-28 ENCOUNTER — Other Ambulatory Visit: Payer: Self-pay | Admitting: Nurse Practitioner

## 2014-06-28 NOTE — Telephone Encounter (Signed)
Closed encounter °

## 2014-06-28 NOTE — Telephone Encounter (Signed)
Received records from [place] (Dr Gaynelle Arabian) for appointment with Dr Gwenlyn Found on 06/29/14.  Records given to [facility] (medical records) for Dr Kennon Holter schedule on 06/29/14.  lp

## 2014-06-29 ENCOUNTER — Encounter: Payer: Self-pay | Admitting: Cardiovascular Disease

## 2014-06-29 ENCOUNTER — Ambulatory Visit (INDEPENDENT_AMBULATORY_CARE_PROVIDER_SITE_OTHER): Payer: Medicare Other | Admitting: Cardiovascular Disease

## 2014-06-29 VITALS — BP 142/76 | HR 64 | Ht 64.0 in | Wt 188.7 lb

## 2014-06-29 DIAGNOSIS — E785 Hyperlipidemia, unspecified: Secondary | ICD-10-CM

## 2014-06-29 DIAGNOSIS — I10 Essential (primary) hypertension: Secondary | ICD-10-CM

## 2014-06-29 NOTE — Assessment & Plan Note (Signed)
Not on statin drug. Followed by her PCP.

## 2014-06-29 NOTE — Patient Instructions (Signed)
Please follow up with Dr. Gwenlyn Found as needed.

## 2014-06-29 NOTE — Assessment & Plan Note (Signed)
History of hypertension with blood pressure measured at 142/76. She is on Prinivil and amlodipine. Continue current meds at current dosing

## 2014-06-29 NOTE — Progress Notes (Signed)
06/29/2014 Sheila Ray   12/16/1940  [id number]  Primary Physician Sheila Pretty, FNP Primary Cardiologist: Sheila Harp MD Sheila Ray   HPI:  Sheila Ray is a 73 year old moderately overweight divorced Caucasian female mother of one child, grandmother of 1 grandchild referred by Sheila Ray , orthopedic surgeon, for preoperative clearance before a planned elective right total knee replacement scheduled for 07/25/14. Her cardiac risk factor profile is notable for treated hypertension, hyperlipidemia and diabetes. There is no family history. She does not smoke. She's never had a heart attack but says she has had a "mini stroke in the past. She denies chest pain or shortness of breath. There was apparently a question of "an abnormal EKG" however her EKG today is totally normal. I believe she is at low risk for her upcoming total knee replacement and does not require preoperative functional study to clear her.   Current Outpatient Prescriptions  Medication Sig Dispense Refill  . amLODipine (NORVASC) 5 MG tablet Take 1 tablet (5 mg total) by mouth daily. 90 tablet 3  . aspirin EC 325 MG tablet Take 325 mg by mouth daily.    Marland Kitchen lisinopril (PRINIVIL,ZESTRIL) 40 MG tablet Take 20 mg by mouth daily.    . metFORMIN (GLUCOPHAGE) 1000 MG tablet TAKE ONE TABLET BY MOUTH with breaksfast 90 tablet 3  . ONE TOUCH ULTRA TEST test strip USE TO CHECK BLOOD GLUCOSE ONCE A DAY AS INSTRUCTED 50 each 1  . ONETOUCH DELICA LANCETS 63O MISC 1 each by Does not apply route daily. Use to check BG daily Dx:  250.02 100 each 2  . Vitamin D, Ergocalciferol, (DRISDOL) 50000 UNITS CAPS capsule Take 1 capsule (50,000 Units total) by mouth every 7 (seven) days. 30 capsule 6   No current facility-administered medications for this visit.    Allergies  Allergen Reactions  . Crestor [Rosuvastatin]     Cramps  . Lipitor [Atorvastatin]     Cramps    History   Social History  . Marital  Status: Divorced    Spouse Name: N/A    Number of Children: N/A  . Years of Education: N/A   Occupational History  . Not on file.   Social History Main Topics  . Smoking status: Never Smoker   . Smokeless tobacco: Not on file  . Alcohol Use: No  . Drug Use: No  . Sexual Activity: Not on file   Other Topics Concern  . Not on file   Social History Narrative     Review of Systems: General: negative for chills, fever, night sweats or weight changes.  Cardiovascular: negative for chest pain, dyspnea on exertion, edema, orthopnea, palpitations, paroxysmal nocturnal dyspnea or shortness of breath Dermatological: negative for rash Respiratory: negative for cough or wheezing Urologic: negative for hematuria Abdominal: negative for nausea, vomiting, diarrhea, bright red blood per rectum, melena, or hematemesis Neurologic: negative for visual changes, syncope, or dizziness All other systems reviewed and are otherwise negative except as noted above.    Blood pressure 142/76, pulse 64, height 5\' 4"  (1.626 m), weight 188 lb 11.2 oz (85.594 kg).  General appearance: alert and no distress Neck: no adenopathy, no carotid bruit, no JVD, supple, symmetrical, trachea midline and thyroid not enlarged, symmetric, no tenderness/mass/nodules Lungs: clear to auscultation bilaterally Heart: regular rate and rhythm, S1, S2 normal, no murmur, click, rub or gallop Extremities: extremities normal, atraumatic, no cyanosis or edema and 2+ pedal pulses bilaterally  EKG normal sinus rhythm at  64 without ST or T-wave changes. I personally reviewed this EKG  ASSESSMENT AND PLAN:   HTN (hypertension) History of hypertension with blood pressure measured at 142/76. She is on Prinivil and amlodipine. Continue current meds at current dosing  HLD (hyperlipidemia) Not on statin drug. Followed by her PCP.      Sheila Harp MD FACP,FACC,FAHA, [facility] 06/29/2014 12:26 PM

## 2014-07-12 ENCOUNTER — Ambulatory Visit: Payer: Self-pay | Admitting: Orthopedic Surgery

## 2014-07-12 NOTE — Progress Notes (Signed)
Preoperative surgical orders have been place into the Epic hospital system for Sheila Ray on 07/12/2014, 1:57 PM  by Mickel Crow for surgery on 07-25-2014.  Preop Total Knee orders including Experal, IV Tylenol, and IV Decadron as long as there are no contraindications to the above medications. Arlee Muslim, PA-C

## 2014-07-12 NOTE — Progress Notes (Signed)
Please put orders in Epic surgery 07-25-14 pre op 07-19-14 Thanks

## 2014-07-14 ENCOUNTER — Ambulatory Visit: Payer: Self-pay | Admitting: Orthopedic Surgery

## 2014-07-14 NOTE — Progress Notes (Signed)
Preoperative surgical orders have been place into the Epic hospital system for Sheila Ray on 07/14/2014, 10:54 AM  by Mickel Crow for surgery on 07-25-2014.  Preop Total Knee orders including Experal, IV Tylenol, and IV Decadron as long as there are no contraindications to the above medications. Arlee Muslim, PA-C

## 2014-07-18 NOTE — Patient Instructions (Addendum)
Sheila Ray  07/18/2014   Your procedure is scheduled on: 07/25/2014    Report to [facility] Main  Entrance and follow signs to               [place] at     0830 AM.  Call this number if you have problems the morning of surgery [phone]   Remember: Eat a good healthy snack prior to bedtime.    Do not eat food or drink liquids :After Midnight.     Take these medicines the morning of surgery with A SIP OF WATER: amlodipine ( Norvasc) )                                You may not have any metal on your body including hair pins and              piercings  Do not wear jewelry, make-up, lotions, powders or perfumes.             Do not wear nail polish.  Do not shave  48 hours prior to surgery.              Do not bring valuables to the hospital. [place].  Contacts, dentures or bridgework may not be worn into surgery.  Leave suitcase in the car. After surgery it may be brought to your room.       Special Instructions: coughing and deep breathing exercises , leg exercises               Please read over the following fact sheets you were given: _____________________________________________________________________             [facility] - Preparing for Surgery Before surgery, you can play an important role.  Because skin is not sterile, your skin needs to be as free of germs as possible.  You can reduce the number of germs on your skin by washing with CHG (chlorahexidine gluconate) soap before surgery.  CHG is an antiseptic cleaner which kills germs and bonds with the skin to continue killing germs even after washing. Please DO NOT use if you have an allergy to CHG or antibacterial soaps.  If your skin becomes reddened/irritated stop using the CHG and inform your nurse when you arrive at Short Stay. Do not shave (including legs and underarms) for at least 48 hours prior to the first CHG shower.  You  may shave your face/neck. Please follow these instructions carefully:  1.  Shower with CHG Soap the night before surgery and the  morning of Surgery.  2.  If you choose to wash your hair, wash your hair first as usual with your  normal  shampoo.  3.  After you shampoo, rinse your hair and body thoroughly to remove the  shampoo.                           4.  Use CHG as you would any other liquid soap.  You can apply chg directly  to the skin and wash                       Gently with  a scrungie or clean washcloth.  5.  Apply the CHG Soap to your body ONLY FROM THE NECK DOWN.   Do not use on face/ open                           Wound or open sores. Avoid contact with eyes, ears mouth and genitals (private parts).                       Wash face,  Genitals (private parts) with your normal soap.             6.  Wash thoroughly, paying special attention to the area where your surgery  will be performed.  7.  Thoroughly rinse your body with warm water from the neck down.  8.  DO NOT shower/wash with your normal soap after using and rinsing off  the CHG Soap.                9.  Pat yourself dry with a clean towel.            10.  Wear clean pajamas.            11.  Place clean sheets on your bed the night of your first shower and do not  sleep with pets. Day of Surgery : Do not apply any lotions/deodorants the morning of surgery.  Please wear clean clothes to the hospital/surgery center.  FAILURE TO FOLLOW THESE INSTRUCTIONS MAY RESULT IN THE CANCELLATION OF YOUR SURGERY PATIENT SIGNATURE_________________________________  NURSE SIGNATURE__________________________________  ________________________________________________________________________  WHAT IS A BLOOD TRANSFUSION? Blood Transfusion Information  A transfusion is the replacement of blood or some of its parts. Blood is made up of multiple cells which provide different functions.  Red blood cells carry oxygen and are used for blood loss  replacement.  White blood cells fight against infection.  Platelets control bleeding.  Plasma helps clot blood.  Other blood products are available for specialized needs, such as hemophilia or other clotting disorders. BEFORE THE TRANSFUSION  Who gives blood for transfusions?   Healthy volunteers who are fully evaluated to make sure their blood is safe. This is blood bank blood. Transfusion therapy is the safest it has ever been in the practice of medicine. Before blood is taken from a donor, a complete history is taken to make sure that person has no history of diseases nor engages in risky social behavior (examples are intravenous drug use or sexual activity with multiple partners). The donor's travel history is screened to minimize risk of transmitting infections, such as malaria. The donated blood is tested for signs of infectious diseases, such as HIV and hepatitis. The blood is then tested to be sure it is compatible with you in order to minimize the chance of a transfusion reaction. If you or a relative donates blood, this is often done in anticipation of surgery and is not appropriate for emergency situations. It takes many days to process the donated blood. RISKS AND COMPLICATIONS Although transfusion therapy is very safe and saves many lives, the main dangers of transfusion include:  1. Getting an infectious disease. 2. Developing a transfusion reaction. This is an allergic reaction to something in the blood you were given. Every precaution is taken to prevent this. The decision to have a blood transfusion has been considered carefully by your caregiver before blood is given. Blood is not given unless the benefits outweigh the risks.  AFTER THE TRANSFUSION  Right after receiving a blood transfusion, you will usually feel much better and more energetic. This is especially true if your red blood cells have gotten low (anemic). The transfusion raises the level of the red blood cells which  carry oxygen, and this usually causes an energy increase.  The nurse administering the transfusion will monitor you carefully for complications. HOME CARE INSTRUCTIONS  No special instructions are needed after a transfusion. You may find your energy is better. Speak with your caregiver about any limitations on activity for underlying diseases you may have. SEEK MEDICAL CARE IF:   Your condition is not improving after your transfusion.  You develop redness or irritation at the intravenous (IV) site. SEEK IMMEDIATE MEDICAL CARE IF:  Any of the following symptoms occur over the next 12 hours:  Shaking chills.  You have a temperature by mouth above 102 F (38.9 C), not controlled by medicine.  Chest, back, or muscle pain.  People around you feel you are not acting correctly or are confused.  Shortness of breath or difficulty breathing.  Dizziness and fainting.  You get a rash or develop hives.  You have a decrease in urine output.  Your urine turns a dark color or changes to pink, red, or brown. Any of the following symptoms occur over the next 10 days:  You have a temperature by mouth above 102 F (38.9 C), not controlled by medicine.  Shortness of breath.  Weakness after normal activity.  The white part of the eye turns yellow (jaundice).  You have a decrease in the amount of urine or are urinating less often.  Your urine turns a dark color or changes to pink, red, or brown. Document Released: 06/14/2000 Document Revised: 09/09/2011 Document Reviewed: 02/01/2008 ExitCare Patient Information 2014 [place].  _______________________________________________________________________  Incentive Spirometer  An incentive spirometer is a tool that can help keep your lungs clear and active. This tool measures how well you are filling your lungs with each breath. Taking long deep breaths may help reverse or decrease the chance of developing breathing (pulmonary) problems  (especially infection) following:  A long period of time when you are unable to move or be active. BEFORE THE PROCEDURE   If the spirometer includes an indicator to show your best effort, your nurse or respiratory therapist will set it to a desired goal.  If possible, sit up straight or lean slightly forward. Try not to slouch.  Hold the incentive spirometer in an upright position. INSTRUCTIONS FOR USE  3. Sit on the edge of your bed if possible, or sit up as far as you can in bed or on a chair. 4. Hold the incentive spirometer in an upright position. 5. Breathe out normally. 6. Place the mouthpiece in your mouth and seal your lips tightly around it. 7. Breathe in slowly and as deeply as possible, raising the piston or the ball toward the top of the column. 8. Hold your breath for 3-5 seconds or for as long as possible. Allow the piston or ball to fall to the bottom of the column. 9. Remove the mouthpiece from your mouth and breathe out normally. 10. Rest for a few seconds and repeat Steps 1 through 7 at least 10 times every 1-2 hours when you are awake. Take your time and take a few normal breaths between deep breaths. 11. The spirometer may include an indicator to show your best effort. Use the indicator as a goal to work toward during  each repetition. 12. After each set of 10 deep breaths, practice coughing to be sure your lungs are clear. If you have an incision (the cut made at the time of surgery), support your incision when coughing by placing a pillow or rolled up towels firmly against it. Once you are able to get out of bed, walk around indoors and cough well. You may stop using the incentive spirometer when instructed by your caregiver.  RISKS AND COMPLICATIONS  Take your time so you do not get dizzy or light-headed.  If you are in pain, you may need to take or ask for pain medication before doing incentive spirometry. It is harder to take a deep breath if you are having  pain. AFTER USE  Rest and breathe slowly and easily.  It can be helpful to keep track of a log of your progress. Your caregiver can provide you with a simple table to help with this. If you are using the spirometer at home, follow these instructions: [place] IF:   You are having difficultly using the spirometer.  You have trouble using the spirometer as often as instructed.  Your pain medication is not giving enough relief while using the spirometer.  You develop fever of 100.5 F (38.1 C) or higher. SEEK IMMEDIATE MEDICAL CARE IF:   You cough up bloody sputum that had not been present before.  You develop fever of 102 F (38.9 C) or greater.  You develop worsening pain at or near the incision site. MAKE SURE YOU:   Understand these instructions.  Will watch your condition.  Will get help right away if you are not doing well or get worse. Document Released: 10/28/2006 Document Revised: 09/09/2011 Document Reviewed: 12/29/2006 [facility] Patient Information 2014 [place], [place].   ________________________________________________________________________

## 2014-07-19 ENCOUNTER — Encounter (HOSPITAL_COMMUNITY): Payer: Self-pay

## 2014-07-19 ENCOUNTER — Encounter (INDEPENDENT_AMBULATORY_CARE_PROVIDER_SITE_OTHER): Payer: Self-pay

## 2014-07-19 ENCOUNTER — Encounter (HOSPITAL_COMMUNITY)
Admission: RE | Admit: 2014-07-19 | Discharge: 2014-07-19 | Disposition: A | Discharge status: Home / Self Care | Payer: Medicare Other | Source: Ambulatory Visit | Attending: Orthopedic Surgery | Admitting: Orthopedic Surgery

## 2014-07-19 DIAGNOSIS — Z01818 Encounter for other preprocedural examination: Secondary | ICD-10-CM | POA: Insufficient documentation

## 2014-07-19 DIAGNOSIS — M179 Osteoarthritis of knee, unspecified: Secondary | ICD-10-CM | POA: Diagnosis not present

## 2014-07-19 HISTORY — DX: Other complications of anesthesia, initial encounter: T88.59XA

## 2014-07-19 HISTORY — DX: Adverse effect of unspecified anesthetic, initial encounter: T41.45XA

## 2014-07-19 HISTORY — DX: Other specified postprocedural states: Z98.890

## 2014-07-19 HISTORY — DX: Unspecified osteoarthritis, unspecified site: M19.90

## 2014-07-19 HISTORY — DX: Other specified postprocedural states: R11.2

## 2014-07-19 LAB — URINALYSIS, ROUTINE W REFLEX MICROSCOPIC
Bilirubin Urine: NEGATIVE
Glucose, UA: NEGATIVE mg/dL
Hgb urine dipstick: NEGATIVE
Ketones, ur: NEGATIVE mg/dL
Nitrite: NEGATIVE
Protein, ur: NEGATIVE mg/dL
Specific Gravity, Urine: 1.005 (ref 1.005–1.030)
Urobilinogen, UA: 0.2 mg/dL (ref 0.0–1.0)
pH: 6 (ref 5.0–8.0)

## 2014-07-19 LAB — CBC
HCT: 44.7 % (ref 36.0–46.0)
Hemoglobin: 14.6 g/dL (ref 12.0–15.0)
MCH: 28.7 pg (ref 26.0–34.0)
MCHC: 32.7 g/dL (ref 30.0–36.0)
MCV: 87.8 fL (ref 78.0–100.0)
Platelets: 286 10*3/uL (ref 150–400)
RBC: 5.09 MIL/uL (ref 3.87–5.11)
RDW: 13.1 % (ref 11.5–15.5)
WBC: 7.8 10*3/uL (ref 4.0–10.5)

## 2014-07-19 LAB — PROTIME-INR
INR: 0.94 (ref 0.00–1.49)
Prothrombin Time: 12.7 seconds (ref 11.6–15.2)

## 2014-07-19 LAB — URINE MICROSCOPIC-ADD ON

## 2014-07-19 LAB — COMPREHENSIVE METABOLIC PANEL
ALT: 19 U/L (ref 0–35)
AST: 22 U/L (ref 0–37)
Albumin: 4 g/dL (ref 3.5–5.2)
Alkaline Phosphatase: 62 U/L (ref 39–117)
Anion gap: 5 (ref 5–15)
BUN: 16 mg/dL (ref 6–23)
CO2: 27 mmol/L (ref 19–32)
Calcium: 9.5 mg/dL (ref 8.4–10.5)
Chloride: 108 mEq/L (ref 96–112)
Creatinine, Ser: 0.78 mg/dL (ref 0.50–1.10)
GFR calc Af Amer: >90 mL/min (ref >90)
GFR calc non Af Amer: 81 mL/min — ABNORMAL LOW (ref >90)
Glucose, Bld: 126 mg/dL — ABNORMAL HIGH (ref 70–99)
Potassium: 4.2 mmol/L (ref 3.5–5.1)
Sodium: 140 mmol/L (ref 135–145)
Total Bilirubin: 0.6 mg/dL (ref 0.3–1.2)
Total Protein: 7 g/dL (ref 6.0–8.3)

## 2014-07-19 LAB — SURGICAL PCR SCREEN
MRSA, PCR: NEGATIVE
Staphylococcus aureus: NEGATIVE

## 2014-07-19 LAB — APTT: aPTT: 29 seconds (ref 24–37)

## 2014-07-19 NOTE — Progress Notes (Signed)
Patient to call back to nurse with name of medication that causes cramps to add to Allergy List.

## 2014-07-19 NOTE — Progress Notes (Signed)
U/A and micro results done 07/19/2014 faxed via EPIC to Dr Wynelle Link.

## 2014-07-19 NOTE — Progress Notes (Signed)
EKG- 06/29/2014 EPIC  CXR- 06/16/2014 - EPIC  LOV with Dr Gwenlyn Found - 06/29/14 EPIC - clearance for surgery  Clearance- 06/16/2014- Dr Laurance Flatten on chart  Clearance 12/30./2015 Dr Gwenlyn Found on chart

## 2014-07-24 ENCOUNTER — Ambulatory Visit: Payer: Self-pay | Admitting: Orthopedic Surgery

## 2014-07-24 NOTE — H&P (Signed)
Sheila Ray DOB: 03/26/1941 Single / Language: Cleophus Molt / Race: White Female Date of Admission:  07/25/2014 CC: Right Knee Pain History of Present Illness  The patient is a 74 year old female who comes in for a preoperative History and Physical. The patient is scheduled for a right total knee arthroplasty to be performed by Dr. Dione Plover. Aluisio, MD at [facility] on 07-25-2014.The patient is a 74 year old female who presents today for follow up of their knee. The patient is being followed for their right knee pain and osteoarthritis. They are now 5 month(s) out from Synvisc series. Symptoms reported today include: pain (occasionally wakes her up at night), aching, instability and difficulty ambulating. The patient feels that they are doing poorly and report their pain level to be mild to moderate. Current treatment includes: pain medications (Advil or Tylenol). The following medication has been used for pain control: antiinflammatory medication (Advil, prn). The knee is starting to bother her at all times. The last cortisone injection did not do as well and neither did the Synvisc injections. She is having difficulty doing activities she desires. She has advanced arthritis in that knee. Unfortunately, the knee is getting progressively worse. Cortisone helps for a short amount of time and we discussed doing a cortisone injection today. She is at a stage were she wants to go ahead and get this knee fixed. We discussed knee replacement in detail, procedure, risks, potential complications, and rehab course and she would like to go ahead and proceed. She is ready to proceed with surgery at this time. They have been treated conservatively in the past for the above stated problem and despite conservative measures, they continue to have progressive pain and severe functional limitations and dysfunction. They have failed non-operative management including home exercise, medications, and injections.  It is felt that they would benefit from undergoing total joint replacement. Risks and benefits of the procedure have been discussed with the patient and they elect to proceed with surgery. There are no active contraindications to surgery such as ongoing infection or rapidly progressive neurological disease.   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 father Drug / Alcohol Addiction brother Hypertension father  Social History Illicit drug use no Alcohol use never consumed alcohol No alcohol use Pain Contract no Number of flights of stairs before winded 2-3 Tobacco / smoke exposure no Previously in rehab no Tobacco use Never smoker. never smoker Current work status working part time Geophysicist/field seismologist weekly; does running / walking and other Drug/Alcohol Rehab (Currently) no Living situation live alone Most recent primary occupation [employer] Marital status divorced [employer] with a 24 hour caregiver  Medication History  Pravastatin Sodium (40MG  Tablet, Oral) Active. AmLODIPine Besylate (10MG  Tablet, Oral) Active. MetFORMIN HCl (1000MG  Tablet, Oral) Active. Lisinopril (20MG  Tablet, Oral) Active. Aspirin (325MG  Tablet, Oral) Active. Vitamin D (Oral) Specific dose unknown - Active.  Past Surgical History Tonsillectomy Other Surgery BILATERAL LUMPECTOMY Arthroscopy of Knee right Colon Polyp Removal - Colonoscopy Hysterectomy complete (non-cancerous)   Review of Systems  General Not Present- Chills, Fatigue, Fever, Memory Loss, Night Sweats, Weight Gain and Weight Loss. Skin Not Present- Eczema,  Hives, Itching, Lesions and Rash. HEENT Not Present- Dentures, Double Vision, Headache, Hearing  Loss, Tinnitus and Visual Loss. Respiratory Not Present- Allergies, Chronic Cough, Coughing up blood, Shortness of breath at rest and Shortness of breath with exertion. Cardiovascular Not Present- Chest Pain, Difficulty Breathing Lying Down, Murmur, Palpitations, Racing/skipping heartbeats and Swelling. Gastrointestinal Not Present- Abdominal Pain, Bloody Stool, Constipation, Diarrhea, Difficulty Swallowing, Heartburn, Jaundice, Loss of appetitie, Nausea and Vomiting. Female Genitourinary Present- Urinating at Night. Not Present- Blood in Urine, Discharge, Flank Pain, Incontinence, Painful Urination, Urgency, Urinary frequency, Urinary Retention and Weak urinary stream. Musculoskeletal Present- Joint Pain. Not Present- Back Pain, Joint Swelling, Morning Stiffness, Muscle Pain, Muscle Weakness and Spasms. Neurological Not Present- Blackout spells, Difficulty with balance, Dizziness, Paralysis, Tremor and Weakness. Psychiatric Not Present- Insomnia.   Vitals Weight: 180 lb Height: 64in Weight was reported by patient. Height was reported by patient. Body Surface Area: 1.87 m Body Mass Index: 30.9 kg/m  BP: 142/74 (Sitting, Left Arm, Standard)   Physical Exam The physical exam findings are as follows: Note:Patient is a 74 year old female with continued knee pain.  General Mental Status -Alert, cooperative and good historian. General Appearance-pleasant, Not in acute distress. Orientation-Oriented X3. Build & Nutrition-Well nourished and Well developed.  Head and Neck Head-normocephalic, atraumatic . Neck Global Assessment - supple, no bruit auscultated on the right, no bruit auscultated on the left.  Eye Vision-Wears corrective lenses. Pupil - Bilateral-Regular and Round. Motion - Bilateral-EOMI.  Chest and Lung Exam Auscultation Breath sounds - clear at  anterior chest wall and clear at posterior chest wall. Adventitious sounds - No Adventitious sounds.  Cardiovascular Auscultation Rhythm - Regular rate and rhythm. Heart Sounds - S1 WNL and S2 WNL. Murmurs & Other Heart Sounds - Auscultation of the heart reveals - No Murmurs.  Abdomen Palpation/Percussion Tenderness - Abdomen is non-tender to palpation. Rigidity (guarding) - Abdomen is soft. Auscultation Auscultation of the abdomen reveals - Bowel sounds normal.  Female Genitourinary Note: Not done, not pertinent to present illness   Musculoskeletal Note: On exam she is alert and oriented in no apparent distress. Both knees show no effusion. Range is about 5 to 120 on the right. She is tender along the medial joint line. There is no lateral joint line tenderness or instability.  RADIOGRAPHS: Review of her radiographs from January of this year showed bone on bone arthritis in the medial and patellofemoral compartments of the right knee.   Assessment & Plan Primary osteoarthritis of right knee (M17.11) Note:Plan is for a Right Total Knee Replacement by Dr. Wynelle Link.  Plan is to go home with a caregiver.  PCP - Dr. Laurance Flatten - Patient has been seen preoperatively and felt to be stable for surgery. Cards - Dr. Gwenlyn Found - Patient has been seen preoperatively and felt to be stable for surgery. Okay to proceed without further cardiac testing.  Topical TXA - History of stroke  Please note that anesthesia has made her nausea in the past.  Signed electronically by Joelene Millin, III PA-C

## 2014-07-25 ENCOUNTER — Inpatient Hospital Stay (HOSPITAL_COMMUNITY): Payer: Medicare Other | Admitting: Certified Registered Nurse Anesthetist

## 2014-07-25 ENCOUNTER — Encounter (HOSPITAL_COMMUNITY): Payer: Self-pay | Admitting: *Deleted

## 2014-07-25 ENCOUNTER — Encounter (HOSPITAL_COMMUNITY)
Admission: RE | Disposition: A | Discharge status: Home Health | Payer: Self-pay | Source: Ambulatory Visit | Attending: Orthopedic Surgery

## 2014-07-25 ENCOUNTER — Inpatient Hospital Stay (HOSPITAL_COMMUNITY)
Admission: RE | Admit: 2014-07-25 | Discharge: 2014-07-27 | DRG: 470 | Disposition: A | Discharge status: Home Health | Payer: Medicare Other | Source: Ambulatory Visit | Attending: Orthopedic Surgery | Admitting: Orthopedic Surgery

## 2014-07-25 DIAGNOSIS — E669 Obesity, unspecified: Secondary | ICD-10-CM | POA: Diagnosis present

## 2014-07-25 DIAGNOSIS — Z8673 Personal history of transient ischemic attack (TIA), and cerebral infarction without residual deficits: Secondary | ICD-10-CM

## 2014-07-25 DIAGNOSIS — Z01812 Encounter for preprocedural laboratory examination: Secondary | ICD-10-CM

## 2014-07-25 DIAGNOSIS — E039 Hypothyroidism, unspecified: Secondary | ICD-10-CM | POA: Diagnosis present

## 2014-07-25 DIAGNOSIS — M1710 Unilateral primary osteoarthritis, unspecified knee: Secondary | ICD-10-CM | POA: Diagnosis present

## 2014-07-25 DIAGNOSIS — Z79899 Other long term (current) drug therapy: Secondary | ICD-10-CM | POA: Diagnosis not present

## 2014-07-25 DIAGNOSIS — Z6832 Body mass index (BMI) 32.0-32.9, adult: Secondary | ICD-10-CM

## 2014-07-25 DIAGNOSIS — E119 Type 2 diabetes mellitus without complications: Secondary | ICD-10-CM | POA: Diagnosis not present

## 2014-07-25 DIAGNOSIS — M1711 Unilateral primary osteoarthritis, right knee: Secondary | ICD-10-CM

## 2014-07-25 DIAGNOSIS — M179 Osteoarthritis of knee, unspecified: Secondary | ICD-10-CM | POA: Diagnosis present

## 2014-07-25 DIAGNOSIS — E785 Hyperlipidemia, unspecified: Secondary | ICD-10-CM | POA: Diagnosis present

## 2014-07-25 DIAGNOSIS — I10 Essential (primary) hypertension: Secondary | ICD-10-CM | POA: Diagnosis not present

## 2014-07-25 DIAGNOSIS — M25561 Pain in right knee: Secondary | ICD-10-CM | POA: Diagnosis present

## 2014-07-25 DIAGNOSIS — E780 Pure hypercholesterolemia: Secondary | ICD-10-CM | POA: Diagnosis not present

## 2014-07-25 HISTORY — PX: TOTAL KNEE ARTHROPLASTY: SHX125

## 2014-07-25 LAB — ABO/RH: ABO/RH(D): A POS

## 2014-07-25 LAB — GLUCOSE, CAPILLARY
Glucose-Capillary: 122 mg/dL — ABNORMAL HIGH (ref 70–99)
Glucose-Capillary: 101 mg/dL — ABNORMAL HIGH (ref 70–99)
Glucose-Capillary: 180 mg/dL — ABNORMAL HIGH (ref 70–99)
Glucose-Capillary: 168 mg/dL — ABNORMAL HIGH (ref 70–99)

## 2014-07-25 LAB — TYPE AND SCREEN
ABO/RH(D): A POS
Antibody Screen: NEGATIVE

## 2014-07-25 SURGERY — ARTHROPLASTY, KNEE, TOTAL
Anesthesia: Spinal | Site: Knee | Laterality: Right

## 2014-07-25 MED ORDER — BUPIVACAINE LIPOSOME 1.3 % IJ SUSP
20.0000 mL | Freq: Once | INTRAMUSCULAR | Status: DC
  Filled 2014-07-25: qty 20

## 2014-07-25 MED ORDER — PROMETHAZINE HCL 25 MG/ML IJ SOLN
6.2500 mg | Freq: Four times a day (QID) | INTRAMUSCULAR | Status: DC | PRN
  Administered 2014-07-25: 6.25 mg via INTRAVENOUS
  Filled 2014-07-25: qty 1

## 2014-07-25 MED ORDER — ACETAMINOPHEN 10 MG/ML IV SOLN: 1000.0000 mg | Freq: Once | INTRAVENOUS | Status: DC

## 2014-07-25 MED ORDER — SODIUM CHLORIDE 0.9 % IV SOLN
INTRAVENOUS | Status: DC
  Administered 2014-07-25: 17:00:00 via INTRAVENOUS

## 2014-07-25 MED ORDER — SODIUM CHLORIDE 0.9 % IJ SOLN
INTRAMUSCULAR | Status: DC | PRN
  Administered 2014-07-25: 30 mL

## 2014-07-25 MED ORDER — PROPOFOL 10 MG/ML IV BOLUS
INTRAVENOUS | Status: AC
  Filled 2014-07-25: qty 20

## 2014-07-25 MED ORDER — CHLORHEXIDINE GLUCONATE 4 % EX LIQD: 60.0000 mL | Freq: Once | CUTANEOUS | Status: DC

## 2014-07-25 MED ORDER — DIPHENHYDRAMINE HCL 12.5 MG/5ML PO ELIX: 12.5000 mg | ORAL_SOLUTION | ORAL | Status: DC | PRN

## 2014-07-25 MED ORDER — METHOCARBAMOL 500 MG PO TABS
500.0000 mg | ORAL_TABLET | Freq: Four times a day (QID) | ORAL | Status: DC | PRN
  Administered 2014-07-26 – 2014-07-27 (×4): 500 mg via ORAL
  Filled 2014-07-25 (×4): qty 1

## 2014-07-25 MED ORDER — METOCLOPRAMIDE HCL 5 MG/ML IJ SOLN
5.0000 mg | Freq: Three times a day (TID) | INTRAMUSCULAR | Status: DC | PRN
  Administered 2014-07-25: 10 mg via INTRAVENOUS
  Filled 2014-07-25: qty 2

## 2014-07-25 MED ORDER — SODIUM CHLORIDE 0.9 % IV SOLN: INTRAVENOUS | Status: DC

## 2014-07-25 MED ORDER — TRAMADOL HCL 50 MG PO TABS: 50.0000 mg | ORAL_TABLET | Freq: Four times a day (QID) | ORAL | Status: DC | PRN

## 2014-07-25 MED ORDER — SODIUM CHLORIDE 0.9 % IJ SOLN
INTRAMUSCULAR | Status: AC
  Filled 2014-07-25: qty 50

## 2014-07-25 MED ORDER — METFORMIN HCL 500 MG PO TABS
1000.0000 mg | ORAL_TABLET | Freq: Every day | ORAL | Status: DC
  Filled 2014-07-25 (×2): qty 2

## 2014-07-25 MED ORDER — KETOROLAC TROMETHAMINE 15 MG/ML IJ SOLN
INTRAMUSCULAR | Status: AC
  Filled 2014-07-25: qty 1

## 2014-07-25 MED ORDER — ACETAMINOPHEN 500 MG PO TABS
1000.0000 mg | ORAL_TABLET | Freq: Four times a day (QID) | ORAL | Status: AC
  Administered 2014-07-25 – 2014-07-26 (×3): 1000 mg via ORAL
  Filled 2014-07-25 (×4): qty 2

## 2014-07-25 MED ORDER — RIVAROXABAN 10 MG PO TABS
10.0000 mg | ORAL_TABLET | Freq: Every day | ORAL | Status: DC
  Administered 2014-07-26 – 2014-07-27 (×2): 10 mg via ORAL
  Filled 2014-07-25 (×3): qty 1

## 2014-07-25 MED ORDER — ACETAMINOPHEN 10 MG/ML IV SOLN
INTRAVENOUS | Status: DC | PRN
  Administered 2014-07-25: 1000 mg via INTRAVENOUS

## 2014-07-25 MED ORDER — TRANEXAMIC ACID 100 MG/ML IV SOLN
2000.0000 mg | Freq: Once | INTRAVENOUS | Status: DC
  Filled 2014-07-25: qty 20

## 2014-07-25 MED ORDER — METHOCARBAMOL 1000 MG/10ML IJ SOLN
500.0000 mg | Freq: Four times a day (QID) | INTRAVENOUS | Status: DC | PRN
  Administered 2014-07-25: 500 mg via INTRAVENOUS
  Filled 2014-07-25 (×2): qty 5

## 2014-07-25 MED ORDER — ACETAMINOPHEN 10 MG/ML IV SOLN
1000.0000 mg | Freq: Once | INTRAVENOUS | Status: DC
  Filled 2014-07-25: qty 100

## 2014-07-25 MED ORDER — PROPOFOL INFUSION 10 MG/ML OPTIME
INTRAVENOUS | Status: DC | PRN
  Administered 2014-07-25: 140 ug/kg/min via INTRAVENOUS

## 2014-07-25 MED ORDER — 0.9 % SODIUM CHLORIDE (POUR BTL) OPTIME
TOPICAL | Status: DC | PRN
  Administered 2014-07-25: 1000 mL

## 2014-07-25 MED ORDER — LACTATED RINGERS IV SOLN
INTRAVENOUS | Status: DC | PRN
  Administered 2014-07-25 (×2): via INTRAVENOUS

## 2014-07-25 MED ORDER — OXYCODONE HCL 5 MG PO TABS
5.0000 mg | ORAL_TABLET | ORAL | Status: DC | PRN
  Administered 2014-07-25 – 2014-07-27 (×8): 10 mg via ORAL
  Filled 2014-07-25 (×2): qty 2
  Filled 2014-07-25: qty 1
  Filled 2014-07-25 (×4): qty 2
  Filled 2014-07-25: qty 1

## 2014-07-25 MED ORDER — ACETAMINOPHEN 650 MG RE SUPP: 650.0000 mg | Freq: Four times a day (QID) | RECTAL | Status: DC | PRN

## 2014-07-25 MED ORDER — CEFAZOLIN SODIUM-DEXTROSE 2-3 GM-% IV SOLR
INTRAVENOUS | Status: AC
  Filled 2014-07-25: qty 50

## 2014-07-25 MED ORDER — ONDANSETRON HCL 4 MG/2ML IJ SOLN
4.0000 mg | Freq: Four times a day (QID) | INTRAMUSCULAR | Status: DC | PRN
  Administered 2014-07-25 – 2014-07-26 (×2): 4 mg via INTRAVENOUS
  Filled 2014-07-25 (×2): qty 2

## 2014-07-25 MED ORDER — FLEET ENEMA 7-19 GM/118ML RE ENEM: 1.0000 | ENEMA | Freq: Once | RECTAL | Status: AC | PRN

## 2014-07-25 MED ORDER — KETOROLAC TROMETHAMINE 15 MG/ML IJ SOLN
7.5000 mg | Freq: Four times a day (QID) | INTRAMUSCULAR | Status: AC | PRN
  Administered 2014-07-25: 7.5 mg via INTRAVENOUS

## 2014-07-25 MED ORDER — DEXAMETHASONE SODIUM PHOSPHATE 10 MG/ML IJ SOLN
10.0000 mg | Freq: Once | INTRAMUSCULAR | Status: AC
  Administered 2014-07-26: 10 mg via INTRAVENOUS
  Filled 2014-07-25: qty 1

## 2014-07-25 MED ORDER — BUPIVACAINE LIPOSOME 1.3 % IJ SUSP
INTRAMUSCULAR | Status: DC | PRN
  Administered 2014-07-25: 20 mL

## 2014-07-25 MED ORDER — BUPIVACAINE LIPOSOME 1.3 % IJ SUSP: 20.0000 mL | Freq: Once | INTRAMUSCULAR | Status: DC

## 2014-07-25 MED ORDER — FENTANYL CITRATE 0.05 MG/ML IJ SOLN
INTRAMUSCULAR | Status: AC
  Filled 2014-07-25: qty 2

## 2014-07-25 MED ORDER — PROPOFOL 10 MG/ML IV BOLUS
INTRAVENOUS | Status: AC
Start: 2014-07-25 — End: 2014-07-25
  Filled 2014-07-25: qty 20

## 2014-07-25 MED ORDER — DEXAMETHASONE SODIUM PHOSPHATE 10 MG/ML IJ SOLN: 10.0000 mg | Freq: Once | INTRAMUSCULAR | Status: DC

## 2014-07-25 MED ORDER — BUPIVACAINE HCL 0.25 % IJ SOLN
INTRAMUSCULAR | Status: DC | PRN
  Administered 2014-07-25: 20 mL

## 2014-07-25 MED ORDER — MIDAZOLAM HCL 5 MG/5ML IJ SOLN
INTRAMUSCULAR | Status: DC | PRN
  Administered 2014-07-25 (×2): 1 mg via INTRAVENOUS

## 2014-07-25 MED ORDER — CEFAZOLIN SODIUM-DEXTROSE 2-3 GM-% IV SOLR
2.0000 g | Freq: Four times a day (QID) | INTRAVENOUS | Status: AC
  Administered 2014-07-25 – 2014-07-26 (×2): 2 g via INTRAVENOUS
  Filled 2014-07-25 (×2): qty 50

## 2014-07-25 MED ORDER — POLYETHYLENE GLYCOL 3350 17 G PO PACK: 17.0000 g | PACK | Freq: Every day | ORAL | Status: DC | PRN

## 2014-07-25 MED ORDER — ONDANSETRON HCL 4 MG/2ML IJ SOLN
INTRAMUSCULAR | Status: DC | PRN
  Administered 2014-07-25: 4 mg via INTRAVENOUS

## 2014-07-25 MED ORDER — FENTANYL CITRATE 0.05 MG/ML IJ SOLN: 25.0000 ug | INTRAMUSCULAR | Status: DC | PRN

## 2014-07-25 MED ORDER — ONDANSETRON HCL 4 MG PO TABS: 4.0000 mg | ORAL_TABLET | Freq: Four times a day (QID) | ORAL | Status: DC | PRN

## 2014-07-25 MED ORDER — PHENOL 1.4 % MT LIQD
1.0000 | OROMUCOSAL | Status: DC | PRN
  Filled 2014-07-25: qty 177

## 2014-07-25 MED ORDER — AMLODIPINE BESYLATE 5 MG PO TABS
5.0000 mg | ORAL_TABLET | Freq: Every day | ORAL | Status: DC
  Administered 2014-07-26 – 2014-07-27 (×2): 5 mg via ORAL
  Filled 2014-07-25 (×2): qty 1

## 2014-07-25 MED ORDER — SODIUM CHLORIDE 0.9 % IV SOLN
10.0000 mg | INTRAVENOUS | Status: DC | PRN
  Administered 2014-07-25: 40 ug/min via INTRAVENOUS

## 2014-07-25 MED ORDER — PROMETHAZINE HCL 25 MG/ML IJ SOLN: 6.2500 mg | INTRAMUSCULAR | Status: DC | PRN

## 2014-07-25 MED ORDER — BUPIVACAINE IN DEXTROSE 0.75-8.25 % IT SOLN
INTRATHECAL | Status: DC | PRN
  Administered 2014-07-25: 1.8 mL via INTRATHECAL

## 2014-07-25 MED ORDER — FENTANYL CITRATE 0.05 MG/ML IJ SOLN
INTRAMUSCULAR | Status: DC | PRN
Start: 2014-07-25 — End: 2014-07-25
  Administered 2014-07-25 (×2): 50 ug via INTRAVENOUS

## 2014-07-25 MED ORDER — METOCLOPRAMIDE HCL 10 MG PO TABS: 5.0000 mg | ORAL_TABLET | Freq: Three times a day (TID) | ORAL | Status: DC | PRN

## 2014-07-25 MED ORDER — ACETAMINOPHEN 325 MG PO TABS: 650.0000 mg | ORAL_TABLET | Freq: Four times a day (QID) | ORAL | Status: DC | PRN

## 2014-07-25 MED ORDER — BUPIVACAINE HCL (PF) 0.25 % IJ SOLN
INTRAMUSCULAR | Status: AC
  Filled 2014-07-25: qty 30

## 2014-07-25 MED ORDER — MORPHINE SULFATE 2 MG/ML IJ SOLN: 1.0000 mg | INTRAMUSCULAR | Status: DC | PRN

## 2014-07-25 MED ORDER — DEXAMETHASONE SODIUM PHOSPHATE 10 MG/ML IJ SOLN
10.0000 mg | Freq: Once | INTRAMUSCULAR | Status: AC
  Administered 2014-07-25: 10 mg via INTRAVENOUS

## 2014-07-25 MED ORDER — INSULIN ASPART 100 UNIT/ML ~~LOC~~ SOLN
0.0000 [IU] | Freq: Three times a day (TID) | SUBCUTANEOUS | Status: DC
  Administered 2014-07-25: 3 [IU] via SUBCUTANEOUS
  Administered 2014-07-26: 8 [IU] via SUBCUTANEOUS
  Administered 2014-07-27: 2 [IU] via SUBCUTANEOUS

## 2014-07-25 MED ORDER — SODIUM CHLORIDE 0.9 % IR SOLN
Status: DC | PRN
  Administered 2014-07-25: 1000 mL

## 2014-07-25 MED ORDER — TRANEXAMIC ACID 100 MG/ML IV SOLN
2000.0000 mg | INTRAVENOUS | Status: DC | PRN
  Administered 2014-07-25: 2000 mg via TOPICAL

## 2014-07-25 MED ORDER — CEFAZOLIN SODIUM-DEXTROSE 2-3 GM-% IV SOLR
2.0000 g | INTRAVENOUS | Status: AC
  Administered 2014-07-25: 2 g via INTRAVENOUS

## 2014-07-25 MED ORDER — MIDAZOLAM HCL 2 MG/2ML IJ SOLN
INTRAMUSCULAR | Status: AC
  Filled 2014-07-25: qty 2

## 2014-07-25 MED ORDER — DOCUSATE SODIUM 100 MG PO CAPS
100.0000 mg | ORAL_CAPSULE | Freq: Two times a day (BID) | ORAL | Status: DC
  Administered 2014-07-26 – 2014-07-27 (×4): 100 mg via ORAL

## 2014-07-25 MED ORDER — MENTHOL 3 MG MT LOZG
1.0000 | LOZENGE | OROMUCOSAL | Status: DC | PRN
  Filled 2014-07-25: qty 9

## 2014-07-25 MED ORDER — BISACODYL 10 MG RE SUPP: 10.0000 mg | Freq: Every day | RECTAL | Status: DC | PRN

## 2014-07-25 MED ORDER — EPHEDRINE SULFATE 50 MG/ML IJ SOLN
INTRAMUSCULAR | Status: DC | PRN
  Administered 2014-07-25 (×2): 2.5 mg via INTRAVENOUS

## 2014-07-25 MED ORDER — MEPERIDINE HCL 50 MG/ML IJ SOLN: 6.2500 mg | INTRAMUSCULAR | Status: DC | PRN

## 2014-07-25 SURGICAL SUPPLY — 61 items
BAG ZIPLOCK 12X15 (MISCELLANEOUS) ×3 IMPLANT
BANDAGE ELASTIC 6 VELCRO ST LF (GAUZE/BANDAGES/DRESSINGS) ×3 IMPLANT
BANDAGE ESMARK 6X9 LF (GAUZE/BANDAGES/DRESSINGS) ×1 IMPLANT
BLADE SAG 18X100X1.27 (BLADE) ×3 IMPLANT
BLADE SAW SGTL 11.0X1.19X90.0M (BLADE) ×3 IMPLANT
BNDG ESMARK 6X9 LF (GAUZE/BANDAGES/DRESSINGS) ×3
BOWL SMART MIX CTS (DISPOSABLE) ×3 IMPLANT
CAP KNEE TOTAL 3 SIGMA ×3 IMPLANT
CEMENT HV SMART SET (Cement) ×6 IMPLANT
CLOSURE WOUND 1/2 X4 (GAUZE/BANDAGES/DRESSINGS) ×1
CUFF TOURN SGL QUICK 34 (TOURNIQUET CUFF) ×2
CUFF TRNQT CYL 34X4X40X1 (TOURNIQUET CUFF) ×1 IMPLANT
DECANTER SPIKE VIAL GLASS SM (MISCELLANEOUS) ×3 IMPLANT
DRAPE EXTREMITY T 121X128X90 (DRAPE) ×3 IMPLANT
DRAPE POUCH INSTRU U-SHP 10X18 (DRAPES) ×3 IMPLANT
DRAPE U-SHAPE 47X51 STRL (DRAPES) ×3 IMPLANT
DRSG ADAPTIC 3X8 NADH LF (GAUZE/BANDAGES/DRESSINGS) ×3 IMPLANT
DRSG PAD ABDOMINAL 8X10 ST (GAUZE/BANDAGES/DRESSINGS) ×3 IMPLANT
DURAPREP 26ML APPLICATOR (WOUND CARE) ×3 IMPLANT
ELECT REM PT RETURN 9FT ADLT (ELECTROSURGICAL) ×3
ELECTRODE REM PT RTRN 9FT ADLT (ELECTROSURGICAL) ×1 IMPLANT
EVACUATOR 1/8 PVC DRAIN (DRAIN) ×3 IMPLANT
FACESHIELD WRAPAROUND (MASK) ×15 IMPLANT
GAUZE SPONGE 4X4 12PLY STRL (GAUZE/BANDAGES/DRESSINGS) ×3 IMPLANT
GLOVE BIO SURGEON STRL SZ8 (GLOVE) ×3 IMPLANT
GLOVE BIOGEL PI IND STRL 6.5 (GLOVE) ×1 IMPLANT
GLOVE BIOGEL PI IND STRL 7.5 (GLOVE) ×1 IMPLANT
GLOVE BIOGEL PI IND STRL 8 (GLOVE) ×2 IMPLANT
GLOVE BIOGEL PI INDICATOR 6.5 (GLOVE) ×2
GLOVE BIOGEL PI INDICATOR 7.5 (GLOVE) ×2
GLOVE BIOGEL PI INDICATOR 8 (GLOVE) ×4
GLOVE SURG SS PI 6.5 STRL IVOR (GLOVE) ×3 IMPLANT
GLOVE SURG SS PI 7.5 STRL IVOR (GLOVE) ×3 IMPLANT
GLOVE SURG SS PI 8.0 STRL IVOR (GLOVE) ×3 IMPLANT
GOWN STRL REUS W/TWL LRG LVL3 (GOWN DISPOSABLE) ×6 IMPLANT
GOWN STRL REUS W/TWL XL LVL3 (GOWN DISPOSABLE) ×3 IMPLANT
HANDPIECE INTERPULSE COAX TIP (DISPOSABLE) ×2
IMMOBILIZER KNEE 20 (SOFTGOODS) ×3
IMMOBILIZER KNEE 20 THIGH 36 (SOFTGOODS) ×1 IMPLANT
KIT BASIN OR (CUSTOM PROCEDURE TRAY) ×3 IMPLANT
MANIFOLD NEPTUNE II (INSTRUMENTS) ×3 IMPLANT
NDL SAFETY ECLIPSE 18X1.5 (NEEDLE) ×3 IMPLANT
NEEDLE HYPO 18GX1.5 SHARP (NEEDLE) ×6
NS IRRIG 1000ML POUR BTL (IV SOLUTION) ×3 IMPLANT
PACK TOTAL JOINT (CUSTOM PROCEDURE TRAY) ×3 IMPLANT
PADDING CAST COTTON 6X4 STRL (CAST SUPPLIES) ×3 IMPLANT
POSITIONER SURGICAL ARM (MISCELLANEOUS) ×3 IMPLANT
SET HNDPC FAN SPRY TIP SCT (DISPOSABLE) ×1 IMPLANT
STRIP CLOSURE SKIN 1/2X4 (GAUZE/BANDAGES/DRESSINGS) ×2 IMPLANT
SUCTION FRAZIER 12FR DISP (SUCTIONS) ×3 IMPLANT
SUT MNCRL AB 4-0 PS2 18 (SUTURE) ×3 IMPLANT
SUT VIC AB 2-0 CT1 27 (SUTURE) ×6
SUT VIC AB 2-0 CT1 TAPERPNT 27 (SUTURE) ×3 IMPLANT
SUT VLOC 180 0 24IN GS25 (SUTURE) ×3 IMPLANT
SYR 20CC LL (SYRINGE) ×3 IMPLANT
SYR 50ML LL SCALE MARK (SYRINGE) ×6 IMPLANT
TOWEL OR 17X26 10 PK STRL BLUE (TOWEL DISPOSABLE) ×3 IMPLANT
TOWEL OR NON WOVEN STRL DISP B (DISPOSABLE) ×3 IMPLANT
TRAY FOLEY CATH 14FRSI W/METER (CATHETERS) ×3 IMPLANT
WATER STERILE IRR 1500ML POUR (IV SOLUTION) ×3 IMPLANT
WRAP KNEE MAXI GEL POST OP (GAUZE/BANDAGES/DRESSINGS) ×3 IMPLANT

## 2014-07-25 NOTE — Transfer of Care (Signed)
Immediate Anesthesia Transfer of Care Note  Patient: Sheila Ray  Procedure(s) Performed: Procedure(s): RIGHT TOTAL KNEE ARTHROPLASTY (Right)  Patient Location: PACU  Anesthesia Type:Spinal  Level of Consciousness: awake, alert , oriented and patient cooperative  Airway & Oxygen Therapy: Patient Spontanous Breathing, Patient connected to nasal cannula oxygen and Patient connected to face mask oxygen  Post-op Assessment: Report given to PACU RN and Post -op Vital signs reviewed and stable  Post vital signs: Reviewed and stable  Complications: No apparent anesthesia complications

## 2014-07-25 NOTE — Anesthesia Preprocedure Evaluation (Addendum)
Anesthesia Evaluation  Patient identified by MRN, date of birth, ID band Patient awake    Reviewed: Allergy & Precautions, NPO status , Patient's Chart, lab work & pertinent test results  History of Anesthesia Complications (+) PONV  Airway Mallampati: II  TM Distance: >3 FB Neck ROM: Full    Dental no notable dental hx. (+) Poor Dentition   Pulmonary neg pulmonary ROS,  breath sounds clear to auscultation  Pulmonary exam normal       Cardiovascular hypertension, Pt. on medications Rhythm:Regular Rate:Normal     Neuro/Psych CVA negative neurological ROS  negative psych ROS   GI/Hepatic negative GI ROS, Neg liver ROS,   Endo/Other  diabetes, Type 2, Oral Hypoglycemic Agents  Renal/GU negative Renal ROS  negative genitourinary   Musculoskeletal negative musculoskeletal ROS (+)   Abdominal   Peds negative pediatric ROS (+)  Hematology negative hematology ROS (+)   Anesthesia Other Findings   Reproductive/Obstetrics negative OB ROS                            Anesthesia Physical Anesthesia Plan  ASA: II  Anesthesia Plan: Spinal   Post-op Pain Management:    Induction:   Airway Management Planned: Simple Face Mask  Additional Equipment:   Intra-op Plan:   Post-operative Plan:   Informed Consent: I have reviewed the patients History and Physical, chart, labs and discussed the procedure including the risks, benefits and alternatives for the proposed anesthesia with the patient or authorized representative who has indicated his/her understanding and acceptance.   Dental advisory given  Plan Discussed with: CRNA  Anesthesia Plan Comments:         Anesthesia Quick Evaluation

## 2014-07-25 NOTE — Progress Notes (Signed)
PT Cancellation Note  Patient Details Name: Sheila Ray MRN: [id number] DOB: Apr 22, 1941   Cancelled Treatment:     Pt declined OOB on POD 0-nauseous and too drowsy. Will attempt OOB with PT on tomorrow. Thanks.    Weston Anna, MPT Pager: [phone]

## 2014-07-25 NOTE — Anesthesia Procedure Notes (Signed)
Spinal Patient location during procedure: OR Start time: 07/25/2014 11:32 AM Staffing Resident/CRNA: Dion Saucier E Performed by: resident/CRNA  Preanesthetic Checklist Completed: patient identified, site marked, surgical consent, pre-op evaluation, timeout performed, IV checked, risks and benefits discussed and monitors and equipment checked Spinal Block Patient position: sitting Prep: Betadine Patient monitoring: heart rate, continuous pulse ox and blood pressure Approach: midline Location: L3-4 Injection technique: single-shot Needle Needle type: Spinocan  Needle gauge: 22 G Needle length: 9 cm Additional Notes Kit expiration date checked.  Tolerated well, neg. Heme, paresthesias.

## 2014-07-25 NOTE — Interval H&P Note (Signed)
History and Physical Interval Note:  07/25/2014 9:32 AM  Sheila Ray  has presented today for surgery, with the diagnosis of right knee osteoarthritis  The various methods of treatment have been discussed with the patient and family. After consideration of risks, benefits and other options for treatment, the patient has consented to  Procedure(s): RIGHT TOTAL KNEE ARTHROPLASTY (Right) as a surgical intervention .  The patient's history has been reviewed, patient examined, no change in status, stable for surgery.  I have reviewed the patient's chart and labs.  Questions were answered to the patient's satisfaction.     Gearlean Alf

## 2014-07-25 NOTE — Anesthesia Postprocedure Evaluation (Signed)
  Anesthesia Post-op Note  Patient: Sheila Ray  Procedure(s) Performed: Procedure(s) (LRB): RIGHT TOTAL KNEE ARTHROPLASTY (Right)  Patient Location: PACU  Anesthesia Type: Spinal  Level of Consciousness: awake and alert   Airway and Oxygen Therapy: Patient Spontanous Breathing  Post-op Pain: mild  Post-op Assessment: Post-op Vital signs reviewed, Patient's Cardiovascular Status Stable, Respiratory Function Stable, Patent Airway and No signs of Nausea or vomiting  Last Vitals:  Filed Vitals:   07/25/14 1636  BP: 171/75  Pulse: 97  Temp: 36.4 C  Resp: 14    Post-op Vital Signs: stable   Complications: No apparent anesthesia complications

## 2014-07-25 NOTE — Progress Notes (Signed)
Utilization review completed.  

## 2014-07-25 NOTE — H&P (View-Only) (Signed)
Sheila Ray DOB: 1941/03/08 Single / Language: Cleophus Molt / Race: White Female Date of Admission:  07/25/2014 CC: Right Knee Pain History of Present Illness  The patient is a 74 year old female who comes in for a preoperative History and Physical. The patient is scheduled for a right total knee arthroplasty to be performed by Dr. Dione Plover. Aluisio, MD at [facility] on 07-25-2014.The patient is a 74 year old female who presents today for follow up of their knee. The patient is being followed for their right knee pain and osteoarthritis. They are now 5 month(s) out from Synvisc series. Symptoms reported today include: pain (occasionally wakes her up at night), aching, instability and difficulty ambulating. The patient feels that they are doing poorly and report their pain level to be mild to moderate. Current treatment includes: pain medications (Advil or Tylenol). The following medication has been used for pain control: antiinflammatory medication (Advil, prn). The knee is starting to bother her at all times. The last cortisone injection did not do as well and neither did the Synvisc injections. She is having difficulty doing activities she desires. She has advanced arthritis in that knee. Unfortunately, the knee is getting progressively worse. Cortisone helps for a short amount of time and we discussed doing a cortisone injection today. She is at a stage were she wants to go ahead and get this knee fixed. We discussed knee replacement in detail, procedure, risks, potential complications, and rehab course and she would like to go ahead and proceed. She is ready to proceed with surgery at this time. They have been treated conservatively in the past for the above stated problem and despite conservative measures, they continue to have progressive pain and severe functional limitations and dysfunction. They have failed non-operative management including home exercise, medications, and injections.  It is felt that they would benefit from undergoing total joint replacement. Risks and benefits of the procedure have been discussed with the patient and they elect to proceed with surgery. There are no active contraindications to surgery such as ongoing infection or rapidly progressive neurological disease.   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 father Drug / Alcohol Addiction brother Hypertension father  Social History Illicit drug use no Alcohol use never consumed alcohol No alcohol use Pain Contract no Number of flights of stairs before winded 2-3 Tobacco / smoke exposure no Previously in rehab no Tobacco use Never smoker. never smoker Current work status working part time Geophysicist/field seismologist weekly; does running / walking and other Drug/Alcohol Rehab (Currently) no Living situation live alone Most recent primary occupation [employer] Marital status divorced [place] with a 24 hour caregiver  Medication History  Pravastatin Sodium (40MG  Tablet, Oral) Active. AmLODIPine Besylate (10MG  Tablet, Oral) Active. MetFORMIN HCl (1000MG  Tablet, Oral) Active. Lisinopril (20MG  Tablet, Oral) Active. Aspirin (325MG  Tablet, Oral) Active. Vitamin D (Oral) Specific dose unknown - Active.  Past Surgical History Tonsillectomy Other Surgery BILATERAL LUMPECTOMY Arthroscopy of Knee right Colon Polyp Removal - Colonoscopy Hysterectomy complete (non-cancerous)   Review of Systems  General Not Present- Chills, Fatigue, Fever, Memory Loss, Night Sweats, Weight Gain and Weight Loss. Skin Not Present- Eczema,  Hives, Itching, Lesions and Rash. HEENT Not Present- Dentures, Double Vision, Headache, Hearing  Loss, Tinnitus and Visual Loss. Respiratory Not Present- Allergies, Chronic Cough, Coughing up blood, Shortness of breath at rest and Shortness of breath with exertion. Cardiovascular Not Present- Chest Pain, Difficulty Breathing Lying Down, Murmur, Palpitations, Racing/skipping heartbeats and Swelling. Gastrointestinal Not Present- Abdominal Pain, Bloody Stool, Constipation, Diarrhea, Difficulty Swallowing, Heartburn, Jaundice, Loss of appetitie, Nausea and Vomiting. Female Genitourinary Present- Urinating at Night. Not Present- Blood in Urine, Discharge, Flank Pain, Incontinence, Painful Urination, Urgency, Urinary frequency, Urinary Retention and Weak urinary stream. Musculoskeletal Present- Joint Pain. Not Present- Back Pain, Joint Swelling, Morning Stiffness, Muscle Pain, Muscle Weakness and Spasms. Neurological Not Present- Blackout spells, Difficulty with balance, Dizziness, Paralysis, Tremor and Weakness. Psychiatric Not Present- Insomnia.   Vitals Weight: 180 lb Height: 64in Weight was reported by patient. Height was reported by patient. Body Surface Area: 1.87 m Body Mass Index: 30.9 kg/m  BP: 142/74 (Sitting, Left Arm, Standard)   Physical Exam The physical exam findings are as follows: Note:Patient is a 74 year old female with continued knee pain.  General Mental Status -Alert, cooperative and good historian. General Appearance-pleasant, Not in acute distress. Orientation-Oriented X3. Build & Nutrition-Well nourished and Well developed.  Head and Neck Head-normocephalic, atraumatic . Neck Global Assessment - supple, no bruit auscultated on the right, no bruit auscultated on the left.  Eye Vision-Wears corrective lenses. Pupil - Bilateral-Regular and Round. Motion - Bilateral-EOMI.  Chest and Lung Exam Auscultation Breath sounds - clear at  anterior chest wall and clear at posterior chest wall. Adventitious sounds - No Adventitious sounds.  Cardiovascular Auscultation Rhythm - Regular rate and rhythm. Heart Sounds - S1 WNL and S2 WNL. Murmurs & Other Heart Sounds - Auscultation of the heart reveals - No Murmurs.  Abdomen Palpation/Percussion Tenderness - Abdomen is non-tender to palpation. Rigidity (guarding) - Abdomen is soft. Auscultation Auscultation of the abdomen reveals - Bowel sounds normal.  Female Genitourinary Note: Not done, not pertinent to present illness   Musculoskeletal Note: On exam she is alert and oriented in no apparent distress. Both knees show no effusion. Range is about 5 to 120 on the right. She is tender along the medial joint line. There is no lateral joint line tenderness or instability.  RADIOGRAPHS: Review of her radiographs from January of this year showed bone on bone arthritis in the medial and patellofemoral compartments of the right knee.   Assessment & Plan Primary osteoarthritis of right knee (M17.11) Note:Plan is for a Right Total Knee Replacement by Dr. Wynelle Link.  Plan is to go home with a caregiver.  PCP - Dr. Laurance Flatten - Patient has been seen preoperatively and felt to be stable for surgery. Cards - Dr. Gwenlyn Found - Patient has been seen preoperatively and felt to be stable for surgery. Okay to proceed without further cardiac testing.  Topical TXA - History of stroke  Please note that anesthesia has made her nausea in the past.  Signed electronically by Joelene Millin, III PA-C

## 2014-07-25 NOTE — Op Note (Signed)
Pre-operative diagnosis- Osteoarthritis  Right knee(s)  Post-operative diagnosis- Osteoarthritis Right knee(s)  Procedure-  Right  Total Knee Arthroplasty  Surgeon- Dione Plover. Dawnya Grams, MD  Assistant- Ardeen Jourdain, PA-C   Anesthesia-  Spinal  EBL-* No blood loss amount entered *   Drains Hemovac  Tourniquet time-  Total Tourniquet Time Documented: Thigh (Right) - 30 minutes Total: Thigh (Right) - 30 minutes     Complications- None  Condition-PACU - hemodynamically stable.   Brief Clinical Note  Sheila Ray is a 74 y.o. year old female with end stage OA of her right knee with progressively worsening pain and dysfunction. She has constant pain, with activity and at rest and significant functional deficits with difficulties even with ADLs. She has had extensive non-op management including analgesics, injections of cortisone and viscosupplements, and home exercise program, but remains in significant pain with significant dysfunction.Radiographs show bone on bone arthritis medial and patellofemoral. She presents now for right Total Knee Arthroplasty.    Procedure in detail---   The patient is brought into the operating room and positioned supine on the operating table. After successful administration of  Spinal,   a tourniquet is placed high on the  Right thigh(s) and the lower extremity is prepped and draped in the usual sterile fashion. Time out is performed by the operating team and then the  Right lower extremity is wrapped in Esmarch, knee flexed and the tourniquet inflated to 300 mmHg.       A midline incision is made with a ten blade through the subcutaneous tissue to the level of the extensor mechanism. A fresh blade is used to make a medial parapatellar arthrotomy. Soft tissue over the proximal medial tibia is subperiosteally elevated to the joint line with a knife and into the semimembranosus bursa with a Cobb elevator. Soft tissue over the proximal lateral tibia is elevated  with attention being paid to avoiding the patellar tendon on the tibial tubercle. The patella is everted, knee flexed 90 degrees and the ACL and PCL are removed. Findings are bone on bone medial and patellofemoral with large medial osteophytes.        The drill is used to create a starting hole in the distal femur and the canal is thoroughly irrigated with sterile saline to remove the fatty contents. The 5 degree Right  valgus alignment guide is placed into the femoral canal and the distal femoral cutting block is pinned to remove 10 mm off the distal femur. Resection is made with an oscillating saw.      The tibia is subluxed forward and the menisci are removed. The extramedullary alignment guide is placed referencing proximally at the medial aspect of the tibial tubercle and distally along the second metatarsal axis and tibial crest. The block is pinned to remove 67mm off the more deficient medial  side. Resection is made with an oscillating saw. Size 3is the most appropriate size for the tibia and the proximal tibia is prepared with the modular drill and keel punch for that size.      The femoral sizing guide is placed and size 3 is most appropriate. Rotation is marked off the epicondylar axis and confirmed by creating a rectangular flexion gap at 90 degrees. The size 3 cutting block is pinned in this rotation and the anterior, posterior and chamfer cuts are made with the oscillating saw. The intercondylar block is then placed and that cut is made.      Trial size 3 tibial component, trial  size 3 posterior stabilized femur and a 12.5  mm posterior stabilized rotating platform insert trial is placed. Full extension is achieved with excellent varus/valgus and anterior/posterior balance throughout full range of motion. The patella is everted and thickness measured to be 22  mm. Free hand resection is taken to 12 mm, a 38 template is placed, lug holes are drilled, trial patella is placed, and it tracks normally.  Osteophytes are removed off the posterior femur with the trial in place. All trials are removed and the cut bone surfaces prepared with pulsatile lavage. Cement is mixed and once ready for implantation, the size 3 tibial implant, size  3 posterior stabilized femoral component, and the size 38 patella are cemented in place and the patella is held with the clamp. The trial insert is placed and the knee held in full extension. The Exparel (20 ml mixed with 30 ml saline) and .25% Bupivicaine, are injected into the extensor mechanism, posterior capsule, medial and lateral gutters and subcutaneous tissues.  All extruded cement is removed and once the cement is hard the permanent 12.5 mm posterior stabilized rotating platform insert is placed into the tibial tray.      The wound is copiously irrigated with saline solution and the extensor mechanism closed over a hemovac drain with #1 V-loc suture. The tourniquet is released for a total tourniquet time of 30  minutes. Flexion against gravity is 140 degrees and the patella tracks normally. Subcutaneous tissue is closed with 2.0 vicryl and subcuticular with running 4.0 Monocryl. The incision is cleaned and dried and steri-strips and a bulky sterile dressing are applied. The limb is placed into a knee immobilizer and the patient is awakened and transported to recovery in stable condition.      Please note that a surgical assistant was a medical necessity for this procedure in order to perform it in a safe and expeditious manner. Surgical assistant was necessary to retract the ligaments and vital neurovascular structures to prevent injury to them and also necessary for proper positioning of the limb to allow for anatomic placement of the prosthesis.   Dione Plover Carmichael Burdette, MD    07/25/2014, 12:24 PM

## 2014-07-26 ENCOUNTER — Encounter (HOSPITAL_COMMUNITY): Payer: Self-pay | Admitting: Orthopedic Surgery

## 2014-07-26 LAB — CBC
HCT: 38.1 % (ref 36.0–46.0)
Hemoglobin: 12.2 g/dL (ref 12.0–15.0)
MCH: 28.1 pg (ref 26.0–34.0)
MCHC: 32 g/dL (ref 30.0–36.0)
MCV: 87.8 fL (ref 78.0–100.0)
Platelets: 215 10*3/uL (ref 150–400)
RBC: 4.34 MIL/uL (ref 3.87–5.11)
RDW: 13 % (ref 11.5–15.5)
WBC: 9.3 10*3/uL (ref 4.0–10.5)

## 2014-07-26 LAB — BASIC METABOLIC PANEL
Anion gap: 7 (ref 5–15)
BUN: 12 mg/dL (ref 6–23)
CO2: 26 mmol/L (ref 19–32)
Calcium: 8.5 mg/dL (ref 8.4–10.5)
Chloride: 105 mmol/L (ref 96–112)
Creatinine, Ser: 0.68 mg/dL (ref 0.50–1.10)
GFR calc Af Amer: >90 mL/min (ref >90)
GFR calc non Af Amer: 85 mL/min — ABNORMAL LOW (ref >90)
Glucose, Bld: 113 mg/dL — ABNORMAL HIGH (ref 70–99)
Potassium: 3.8 mmol/L (ref 3.5–5.1)
Sodium: 138 mmol/L (ref 135–145)

## 2014-07-26 LAB — GLUCOSE, CAPILLARY
Glucose-Capillary: 118 mg/dL — ABNORMAL HIGH (ref 70–99)
Glucose-Capillary: 144 mg/dL — ABNORMAL HIGH (ref 70–99)
Glucose-Capillary: 257 mg/dL — ABNORMAL HIGH (ref 70–99)
Glucose-Capillary: 177 mg/dL — ABNORMAL HIGH (ref 70–99)

## 2014-07-26 NOTE — Discharge Instructions (Addendum)
° °Dr. Frank Aluisio °Total Joint Specialist °[facility] °[address] °[place], [place] [address] °[phone] ° °TOTAL KNEE REPLACEMENT POSTOPERATIVE DIRECTIONS ° ° ° °Knee Rehabilitation, Guidelines Following Surgery  °Results after knee surgery are often greatly improved when you follow the exercise, range of motion and muscle strengthening exercises prescribed by your doctor. Safety measures are also important to protect the knee from further injury. Any time any of these exercises cause you to have increased pain or swelling in your knee joint, decrease the amount until you are comfortable again and slowly increase them. If you have problems or questions, call your caregiver or physical therapist for advice.  ° °HOME CARE INSTRUCTIONS  °Remove items at home which could result in a fall. This includes throw rugs or furniture in walking pathways.  °Continue medications as instructed at time of discharge. °You may have some home medications which will be placed on hold until you complete the course of blood thinner medication.  °You may start showering once you are discharged home but do not submerge the incision under water. Just pat the incision dry and apply a dry gauze dressing on daily. °Walk with walker as instructed.  °You may resume a sexual relationship in one month or when given the OK by  your doctor.  °· Use walker as long as suggested by your caregivers. °· Avoid periods of inactivity such as sitting longer than an hour when not asleep. This helps prevent blood clots.  °You may put full weight on your legs and walk as much as is comfortable.  °You may return to work once you are cleared by your doctor.  °Do not drive a car for 6 weeks or until released by you surgeon.  °· Do not drive while taking narcotics.  °Wear the elastic stockings for three weeks following surgery during the day but you may remove then at night. °Make sure you keep all of your appointments after your  operation with all of your doctors and caregivers. You should call the office at the above phone number and make an appointment for approximately two weeks after the date of your surgery. °Change the dressing daily and reapply a dry dressing each time. °Please pick up a stool softener and laxative for home use as long as you are requiring pain medications. °· ICE to the affected knee every three hours for 30 minutes at a time and then as needed for pain and swelling.  Continue to use ice on the knee for pain and swelling from surgery. You may notice swelling that will progress down to the foot and ankle.  This is normal after surgery.  Elevate the leg when you are not up walking on it.   °It is important for you to complete the blood thinner medication as prescribed by your doctor. °· Continue to use the breathing machine which will help keep your temperature down.  It is common for your temperature to cycle up and down following surgery, especially at night when you are not up moving around and exerting yourself.  The breathing machine keeps your lungs expanded and your temperature down. ° °RANGE OF MOTION AND STRENGTHENING EXERCISES  °Rehabilitation of the knee is important following a knee injury or an operation. After just a few days of immobilization, the muscles of the thigh which control the knee become weakened and shrink (atrophy). Knee exercises are designed to build up the tone and strength of the thigh muscles and to improve knee   motion. Often times heat used for twenty to thirty minutes before working out will loosen up your tissues and help with improving the range of motion but do not use heat for the first two weeks following surgery. These exercises can be done on a training (exercise) mat, on the floor, on a table or on a bed. Use what ever works the best and is most comfortable for you Knee exercises include:  Leg Lifts - While your knee is still immobilized in a splint or cast, you can do  straight leg raises. Lift the leg to 60 degrees, hold for 3 sec, and slowly lower the leg. Repeat 10-20 times 2-3 times daily. Perform this exercise against resistance later as your knee gets better.  Quad and Hamstring Sets - Tighten up the muscle on the front of the thigh (Quad) and hold for 5-10 sec. Repeat this 10-20 times hourly. Hamstring sets are done by pushing the foot backward against an object and holding for 5-10 sec. Repeat as with quad sets.  A rehabilitation program following serious knee injuries can speed recovery and prevent re-injury in the future due to weakened muscles. Contact your doctor or a physical therapist for more information on knee rehabilitation.   SKILLED REHAB INSTRUCTIONS: If the patient is transferred to a skilled rehab facility following release from the hospital, a list of the current medications will be sent to the facility for the patient to continue.  When discharged from the skilled rehab facility, please have the facility set up the patient's [place] prior to being released. Also, the skilled facility will be responsible for providing the patient with their medications at time of release from the facility to include their pain medication, the muscle relaxants, and their blood thinner medication. If the patient is still at the rehab facility at time of the two week follow up appointment, the skilled rehab facility will also need to assist the patient in arranging follow up appointment in our office and any transportation needs.  MAKE SURE YOU:  Understand these instructions.  Will watch your condition.  Will get help right away if you are not doing well or get worse.    Pick up stool softner and laxative for home use following surgery while on pain medications. Do not submerge incision under water. Please use good hand washing techniques while changing dressing each day. May shower starting three days after surgery. Please use a clean  towel to pat the incision dry following showers. Continue to use ice for pain and swelling after surgery.  Take Xarelto for two and a half more weeks, then discontinue Xarelto. Once the patient has completed the Xarelto, they may resume the 325 mg Aspirin.  Postoperative Constipation Protocol  Constipation - defined medically as fewer than three stools per week and severe constipation as less than one stool per week.  One of the most common issues patients have following surgery is constipation.  Even if you have a regular bowel pattern at home, your normal regimen is likely to be disrupted due to multiple reasons following surgery.  Combination of anesthesia, postoperative narcotics, change in appetite and fluid intake all can affect your bowels.  In order to avoid complications following surgery, here are some recommendations in order to help you during your recovery period.  Colace (docusate) - Pick up an over-the-counter form of Colace or another stool softener and take twice a day as long as you are requiring postoperative pain medications.  Take with  a full glass of water daily.  If you experience loose stools or diarrhea, hold the colace until you stool forms back up.  If your symptoms do not get better within 1 week or if they get worse, check with your doctor.  Dulcolax (bisacodyl) - Pick up over-the-counter and take as directed by the product packaging as needed to assist with the movement of your bowels.  Take with a full glass of water.  Use this product as needed if not relieved by Colace only.   MiraLax (polyethylene glycol) - Pick up over-the-counter to have on hand.  MiraLax is a solution that will increase the amount of water in your bowels to assist with bowel movements.  Take as directed and can mix with a glass of water, juice, soda, coffee, or tea.  Take if you go more than two days without a movement. Do not use MiraLax more than once per day. Call your doctor if you are still  constipated or irregular after using this medication for 7 days in a row.  If you continue to have problems with postoperative constipation, please contact the office for further assistance and recommendations.  If you experience "the worst abdominal pain ever" or develop nausea or vomiting, please contact the office immediatly for further recommendations for treatment.  Do not use any lotions or creams on the incision until instructed by your surgeon.    Information on my medicine - XARELTO (Rivaroxaban)  This medication education was reviewed with me or my healthcare representative as part of my discharge preparation.  The pharmacist that spoke with me during my hospital stay was:  Angela [facility]  Why was Xarelto prescribed for you? Xarelto was prescribed for you to reduce the risk of blood clots forming after orthopedic surgery. The medical term for these abnormal blood clots is venous thromboembolism (VTE).  What do you need to know about xarelto ? Take your Xarelto ONCE DAILY at the same time every day. You may take it either with or without food.  If you have difficulty swallowing the tablet whole, you may crush it and mix in applesauce just prior to taking your dose.  Take Xarelto exactly as prescribed by your doctor and DO NOT stop taking Xarelto without talking to the doctor who prescribed the medication.  Stopping without other VTE prevention medication to take the place of Xarelto may increase your risk of developing a clot.  After discharge, you should have regular check-up appointments with your healthcare provider that is prescribing your Xarelto.    What do you do if you miss a dose? If you miss a dose, take it as soon as you remember on the same day then continue your regularly scheduled once daily regimen the next day. Do not take two doses of Xarelto on the same day.   Important Safety Information A possible side effect of Xarelto is bleeding. You  should call your healthcare provider right away if you experience any of the following: ? Bleeding from an injury or your nose that does not stop. ? Unusual colored urine (red or dark brown) or unusual colored stools (red or black). ? Unusual bruising for unknown reasons. ? A serious fall or if you hit your head (even if there is no bleeding).  Some medicines may interact with Xarelto and might increase your risk of bleeding while on Xarelto. To help avoid this, consult your healthcare provider or pharmacist prior to using any new prescription or non-prescription medications, including  herbals, vitamins, non-steroidal anti-inflammatory drugs (NSAIDs) and supplements.  This website has more information on Xarelto: [email or web address]

## 2014-07-26 NOTE — Evaluation (Signed)
Occupational Therapy Evaluation Patient Details Name: Sheila Ray MRN: [id number] DOB: [birth year] Today's Date: 07/26/2014    History of Present Illness Pt is a 74 year old female s/p R TKA.   Clinical Impression   This 74 year old female was admitted for the above surgery. She will benefit from skilled OT to increase safety and indpendence with adls.  Pt was independent prior to admission.  Goals in acute are for supervision to min guard for bathroom transfers.  Pt will have assistance for adls.      Follow Up Recommendations    Home Health OT; initial 24/7 vs intermittent supervision, depending upon progress   Equipment Recommendations  3 in 1 bedside comode    Recommendations for Other Services       Precautions / Restrictions Precautions Precautions: Knee Required Braces or Orthoses: Knee Immobilizer - Right Restrictions Weight Bearing Restrictions: No      Mobility Bed Mobility              Transfers Overall transfer level: Needs assistance Equipment used: Rolling walker (2 wheeled) Transfers: Sit to/from Stand Sit to Stand: Min assist             Balance                                            ADL Overall ADL's : Needs assistance/impaired     Grooming: Set up;Sitting   Upper Body Bathing: Supervision/ safety;Sitting   Lower Body Bathing: Minimal assistance;Sit to/from stand   Upper Body Dressing : Set up;Sitting   Lower Body Dressing: Moderate assistance;Sit to/from stand                 General ADL Comments: Pt has catheter:  did not perform transfer     Vision                     Perception     Praxis      Pertinent Vitals/Pain Pain Assessment: 0-10 Pain Score: 5  Pain Location: R knee Pain Descriptors / Indicators: Sore;Aching Pain Intervention(s): Limited activity within patient's tolerance;Monitored during session;Premedicated before session;Repositioned;Ice applied     Hand  Dominance     Extremity/Trunk Assessment Upper Extremity Assessment Upper Extremity Assessment: Overall WFL for tasks assessed          Communication Communication Communication: No difficulties   Cognition Arousal/Alertness: Awake/alert Behavior During Therapy: WFL for tasks assessed/performed Overall Cognitive Status: Within Functional Limits for tasks assessed                     General Comments       Exercises       Shoulder Instructions      Home Living Family/patient expects to be discharged to:: Private residence Living Arrangements: Alone Available Help at Discharge: Family;Neighbor Type of Home: House Home Access: Stairs to enter Secretary/administrator of Steps: 1 Entrance Stairs-Rails: None Home Layout: One level     Bathroom Shower/Tub: Producer, television/film/video: Standard     Home Equipment: Environmental consultant - 2 wheels   Additional Comments: plans to have grandson stay at night and have neighbor help with adls; help intermittently during the day      Prior Functioning/Environment Level of Independence: Independent             OT  Diagnosis: Generalized weakness   OT Problem List: Decreased strength;Decreased activity tolerance;Decreased knowledge of use of DME or AE;Pain   OT Treatment/Interventions: Self-care/ADL training;DME and/or AE instruction;Patient/family education    OT Goals(Current goals can be found in the care plan section) Acute Rehab OT Goals Patient Stated Goal: home OT Goal Formulation: With patient Time For Goal Achievement: 08/02/14 Potential to Achieve Goals: Good ADL Goals Pt Will Perform Grooming: with supervision;standing Pt Will Transfer to Toilet: with supervision;bedside commode;ambulating Pt Will Perform Toileting - Clothing Manipulation and hygiene: with supervision;sit to/from stand Pt Will Perform Tub/Shower Transfer: Shower transfer;ambulating;3 in 1;with min guard assist  OT Frequency: Min 2X/week    Barriers to D/C:            Co-evaluation              End of Session    Activity Tolerance: Patient limited by pain Patient left: in chair;with call bell/phone within reach   Time: 0959-1012 OT Time Calculation (min): 13 min Charges:  OT General Charges $OT Visit: 1 Procedure OT Evaluation $Initial OT Evaluation Tier I: 1 Procedure G-Codes:    Zakaiya Lares August 08, 2014, 1:08 PM  Marica Otter, OTR/L [phone] Aug 08, 2014

## 2014-07-26 NOTE — Plan of Care (Signed)
Problem: Phase I Progression Outcomes Goal: Dangle or out of bed evening of surgery Outcome: Not Met (add Reason) Will do today.

## 2014-07-26 NOTE — Progress Notes (Signed)
   Subjective: 1 Day Post-Op Procedure(s) (LRB): RIGHT TOTAL KNEE ARTHROPLASTY (Right) Patient had a great night on the evening of surgery.  She was able to sleep. Patient seen in rounds with Dr. Wynelle Link. Patient is well, and has had no acute complaints or problems We will start therapy today.  Plan is to go Home after hospital stay.  Objective: Vital signs in last 24 hours: Temp:  [97.4 F (36.3 C)-98.3 F (36.8 C)] 98.3 F (36.8 C) (01/26 0500) Pulse Rate:  [62-97] 64 (01/26 0500) Resp:  [14-20] 20 (01/26 0500) BP: (120-171)/(60-83) 120/70 mmHg (01/26 0500) SpO2:  [94 %-100 %] 96 % (01/26 0500) Weight:  [85.276 kg (188 lb)] 85.276 kg (188 lb) (01/25 0901)  Intake/Output from previous day:  Intake/Output Summary (Last 24 hours) at 07/26/14 0828 Last data filed at 07/26/14 0700  Gross per 24 hour  Intake   3040 ml  Output   1705 ml  Net   1335 ml    Intake/Output this shift: UOP 275 since MN  Labs:  Recent Labs  07/26/14 0540  HGB 12.2    Recent Labs  07/26/14 0540  WBC 9.3  RBC 4.34  HCT 38.1  PLT 215    Recent Labs  07/26/14 0540  NA 138  K 3.8  CL 105  CO2 26  BUN 12  CREATININE 0.68  GLUCOSE 113*  CALCIUM 8.5   No results for input(s): LABPT, INR in the last 72 hours.  EXAM General - Patient is Alert, Appropriate and Oriented Extremity - Neurovascular intact Sensation intact distally Dorsiflexion/Plantar flexion intact Dressing - dressing C/D/I Motor Function - intact, moving foot and toes well on exam.  Hemovac pulled without difficulty.  Past Medical History  Diagnosis Date  . Hypertension   . Goiter   . Obese   . Osteopenia   . Fatigue   . Gastric polyp   . Diverticulosis   . Rosacea   . Postmenopausal   . Vitamin D deficiency   . Hyperlipidemia   . Diabetes mellitus without complication   . 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 Complication of anesthesia   . PONV (postoperative nausea and vomiting)    . Hypothyroid     taken off of thyroid medication 2012014   . Arthritis     Assessment/Plan: 1 Day Post-Op Procedure(s) (LRB): RIGHT TOTAL KNEE ARTHROPLASTY (Right) Principal Problem:   OA (osteoarthritis) of knee  Estimated body mass index is 32.25 kg/(m^2) as calculated from the following:   Height as of this encounter: 5\' 4"  (1.626 m).   Weight as of this encounter: 85.276 kg (188 lb). Advance diet Up with therapy Plan for discharge tomorrow Discharge home with home health  DVT Prophylaxis - Xarelto Weight-Bearing as tolerated to right leg D/C O2 and Pulse OX and try on Room Air  Arlee Muslim, PA-C Orthopaedic Surgery 07/26/2014, 8:28 AM

## 2014-07-26 NOTE — Care Management Note (Signed)
    Page 1 of 2   07/26/2014     11:11:30 AM CARE MANAGEMENT NOTE 07/26/2014  Patient:  Sheila Ray, Sheila Ray   Account Number:  [id number]  Date Initiated:  07/26/2014  Documentation initiated by:  [facility]  Subjective/Objective Assessment:   adm: RIGHT TOTAL KNEE ARTHROPLASTY (Right)     Action/Plan:   discharge planing   Anticipated DC Date:  07/27/2014   Anticipated DC Plan:  [place]  CM consult      [facility] Choice  HOME HEALTH   Choice offered to / List presented to:  C-1 Patient   DME arranged  3-N-1      DME agency  [place] arranged  [facility]   Status of service:  Completed, signed off Medicare Important Message given?   (If response is "NO", the following Medicare IM given date fields will be blank) Date Medicare IM given:   Medicare IM given by:   Date Additional Medicare IM given:   Additional Medicare IM given by:    Discharge Disposition:  [place]  Per UR Regulation:    If discussed at Long Length of Stay Meetings, dates discussed:    Comments:  07/26/13 07:45 CM met with pt in room to offer choice of home health agency.  Pt chooses [facility] to render HHPT. Address and contact information verified by pt.  CM called [facility] DME rep, Lecretia to please deliver the 3n1 to room prior to discharge.  Referral given to Shaune Leeks. No other CM needs were communicated.  Christella Scheuermann, BSN, [place].

## 2014-07-26 NOTE — Evaluation (Signed)
Physical Therapy Evaluation Patient Details Name: Sheila Ray MRN: [id number] DOB: [birth year] Today's Date: 07/26/2014   History of Present Illness  Pt is a 74 year old female s/p R TKA.  Clinical Impression  Pt is s/p R TKA resulting in the deficits listed below (see PT Problem List).  Pt will benefit from skilled PT to increase their independence and safety with mobility to allow discharge to the venue listed below.  Pt mobilizing well POD #1 and plans to d/c home with assist from neighbor and grandson.     Follow Up Recommendations Home health PT    Equipment Recommendations  None recommended by PT    Recommendations for Other Services       Precautions / Restrictions Precautions Precautions: Knee Required Braces or Orthoses: Knee Immobilizer - Right Restrictions Weight Bearing Restrictions: No      Mobility  Bed Mobility Overal bed mobility: Needs Assistance Bed Mobility: Supine to Sit     Supine to sit: Min assist     General bed mobility comments: assist for R LE  Transfers Overall transfer level: Needs assistance Equipment used: Rolling walker (2 wheeled) Transfers: Sit to/from Stand Sit to Stand: Min assist         General transfer comment: verbal cues for safe technique, assist to rise and steady  Ambulation/Gait Ambulation/Gait assistance: Min assist Ambulation Distance (Feet): 80 Feet Assistive device: Rolling walker (2 wheeled) Gait Pattern/deviations: Step-to pattern;Antalgic     General Gait Details: verbal cues for sequence, RW distance, posture, step length  Stairs            Wheelchair Mobility    Modified Rankin (Stroke Patients Only)       Balance                                             Pertinent Vitals/Pain Pain Assessment: 0-10 Pain Score: 5  Pain Location: R knee Pain Descriptors / Indicators: Sore;Aching Pain Intervention(s): Limited activity within patient's tolerance;Monitored during  session;Premedicated before session;Repositioned;Ice applied    Home Living Family/patient expects to be discharged to:: Private residence Living Arrangements: Alone Available Help at Discharge: Family;Neighbor Type of Home: House Home Access: Stairs to enter Entrance Stairs-Rails: None Secretary/administrator of Steps: 1 Home Layout: One level Home Equipment: Environmental consultant - 2 wheels Additional Comments: plans to have grandson stay at night and have neighbor help with adls; help intermittently during the day    Prior Function Level of Independence: Independent               Hand Dominance        Extremity/Trunk Assessment   Upper Extremity Assessment: Overall WFL for tasks assessed           Lower Extremity Assessment: RLE deficits/detail RLE Deficits / Details: poor quad contraction, unable to perform SLR, maintained KI       Communication   Communication: No difficulties  Cognition Arousal/Alertness: Awake/alert Behavior During Therapy: WFL for tasks assessed/performed Overall Cognitive Status: Within Functional Limits for tasks assessed                      General Comments      Exercises Total Joint Exercises Ankle Circles/Pumps: AROM;Both;15 reps Quad Sets: AROM;Both;15 reps Towel Squeeze: AROM;Both;10 reps Short Arc QuadBarbaraann Boys;Right;10 reps Heel Slides: AAROM;Right;10 reps Hip ABduction/ADduction: AROM;Right;10 reps Straight Leg  Raises: AAROM;Right;10 reps      Assessment/Plan    PT Assessment Patient needs continued PT services  PT Diagnosis Difficulty walking;Acute pain   PT Problem List Decreased strength;Decreased mobility;Decreased range of motion;Decreased knowledge of use of DME;Pain  PT Treatment Interventions Functional mobility training;Gait training;DME instruction;Patient/family education;Therapeutic activities;Therapeutic exercise;Stair training   PT Goals (Current goals can be found in the Care Plan section) Acute Rehab PT  Goals Patient Stated Goal: home PT Goal Formulation: With patient Time For Goal Achievement: 08/02/14 Potential to Achieve Goals: Good    Frequency 7X/week   Barriers to discharge        Co-evaluation               End of Session Equipment Utilized During Treatment: Gait belt;Right knee immobilizer Activity Tolerance: Patient tolerated treatment well Patient left: with call bell/phone within reach;in chair Nurse Communication: Mobility status         Time: 2694-8546 PT Time Calculation (min) (ACUTE ONLY): 24 min   Charges:   PT Evaluation $Initial PT Evaluation Tier I: 1 Procedure PT Treatments $Gait Training: 8-22 mins $Therapeutic Exercise: 8-22 mins   PT G Codes:        [place] Tozer,KATHrine E 07/26/2014, 12:53 PM Zenovia Jarred, PT, DPT 07/26/2014 Pager: [phone]

## 2014-07-26 NOTE — Progress Notes (Signed)
Physical Therapy Treatment Note    07/26/14 1500  PT Visit Information  Last PT Received On 07/26/14  Assistance Needed +1  History of Present Illness Pt is a 74 year old female s/p R TKA.  PT Time Calculation  PT Start Time (ACUTE ONLY) 1430  PT Stop Time (ACUTE ONLY) 1442  PT Time Calculation (min) (ACUTE ONLY) 12 min  Subjective Data  Subjective Pt ambulated in hallway again this afternoon.  Precautions  Precautions Knee  Required Braces or Orthoses Knee Immobilizer - Right  Restrictions  Other Position/Activity Restrictions WBAT  Pain Assessment  Pain Assessment 0-10  Pain Score 3  Pain Location R knee  Pain Descriptors / Indicators Aching;Sore  Pain Intervention(s) Limited activity within patient's tolerance;Monitored during session;Repositioned  Cognition  Arousal/Alertness Awake/alert  Behavior During Therapy WFL for tasks assessed/performed  Overall Cognitive Status Within Functional Limits for tasks assessed  Bed Mobility  Overal bed mobility Needs Assistance  Bed Mobility Supine to Sit  Supine to sit Min assist  General bed mobility comments assist for R LE  Transfers  Overall transfer level Needs assistance  Equipment used Rolling walker (2 wheeled)  Transfers Sit to/from Stand  Sit to Stand Min assist  General transfer comment verbal cues for safe technique, assist to rise and steady  Ambulation/Gait  Ambulation/Gait assistance Min assist  Ambulation Distance (Feet) 120 Feet  Assistive device Rolling walker (2 wheeled)  Gait Pattern/deviations Step-to pattern;Antalgic;Trunk flexed  General Gait Details verbal cues for sequence, RW distance, posture, step length  PT - End of Session  Equipment Utilized During Treatment Right knee immobilizer  Activity Tolerance Patient tolerated treatment well  Patient left with call bell/phone within reach;in chair;with family/visitor present  PT - Assessment/Plan  PT Plan Current plan remains appropriate  PT Frequency  (ACUTE ONLY) 7X/week  Follow Up Recommendations Home health PT  PT equipment None recommended by PT  PT Goal Progression  Progress towards PT goals Progressing toward goals  PT General Charges  $$ ACUTE PT VISIT 1 Procedure  PT Treatments  $Gait Training 8-22 mins   Carmelia Bake, PT, DPT 07/26/2014 Pager: [phone]

## 2014-07-27 LAB — BASIC METABOLIC PANEL
Anion gap: 7 (ref 5–15)
BUN: 12 mg/dL (ref 6–23)
CO2: 26 mmol/L (ref 19–32)
Calcium: 9 mg/dL (ref 8.4–10.5)
Chloride: 104 mmol/L (ref 96–112)
Creatinine, Ser: 0.65 mg/dL (ref 0.50–1.10)
GFR calc Af Amer: >90 mL/min (ref >90)
GFR calc non Af Amer: 86 mL/min — ABNORMAL LOW (ref >90)
Glucose, Bld: 145 mg/dL — ABNORMAL HIGH (ref 70–99)
Potassium: 3.9 mmol/L (ref 3.5–5.1)
Sodium: 137 mmol/L (ref 135–145)

## 2014-07-27 LAB — CBC
HCT: 35.9 % — ABNORMAL LOW (ref 36.0–46.0)
Hemoglobin: 11.7 g/dL — ABNORMAL LOW (ref 12.0–15.0)
MCH: 28.2 pg (ref 26.0–34.0)
MCHC: 32.6 g/dL (ref 30.0–36.0)
MCV: 86.5 fL (ref 78.0–100.0)
Platelets: 228 10*3/uL (ref 150–400)
RBC: 4.15 MIL/uL (ref 3.87–5.11)
RDW: 12.9 % (ref 11.5–15.5)
WBC: 13.9 10*3/uL — ABNORMAL HIGH (ref 4.0–10.5)

## 2014-07-27 LAB — GLUCOSE, CAPILLARY: Glucose-Capillary: 122 mg/dL — ABNORMAL HIGH (ref 70–99)

## 2014-07-27 MED ORDER — OXYCODONE HCL 5 MG PO TABS: 5.0000 mg | ORAL_TABLET | ORAL | Status: DC | PRN

## 2014-07-27 MED ORDER — METHOCARBAMOL 500 MG PO TABS: 500.0000 mg | ORAL_TABLET | Freq: Four times a day (QID) | ORAL | Status: DC | PRN

## 2014-07-27 MED ORDER — TRAMADOL HCL 50 MG PO TABS: 50.0000 mg | ORAL_TABLET | Freq: Four times a day (QID) | ORAL | Status: DC | PRN

## 2014-07-27 MED ORDER — RIVAROXABAN 10 MG PO TABS: 10.0000 mg | ORAL_TABLET | Freq: Every day | ORAL | Status: DC

## 2014-07-27 NOTE — Progress Notes (Signed)
RN reviewed discharge instructions with patient and family. All questions answered.  Paperwork and prescriptions given.   NT rolled patient down in wheelchair to family car.  

## 2014-07-27 NOTE — Progress Notes (Signed)
Occupational Therapy Treatment Patient Details Name: Sheila Ray MRN: [id number] DOB: [birth year] Today's Date: 07/27/2014    History of present illness Pt is a 74 year old female s/p R TKA.   OT comments  Pt progressing well.  Will have initial 24/7 supervision then intermittent  Follow Up Recommendations  Home health OT    Equipment Recommendations  3 in 1 bedside comode    Recommendations for Other Services      Precautions / Restrictions Precautions Precautions: Knee Required Braces or Orthoses: Knee Immobilizer - Right Restrictions Other Position/Activity Restrictions: WBAT       Mobility Bed Mobility               General bed mobility comments: pt in chair  Transfers   Equipment used: Rolling walker (2 wheeled) Transfers: Sit to/from Stand Sit to Stand: Supervision         General transfer comment: vcs for LE placement    Balance                                   ADL       Grooming: Oral care;Standing;Supervision/safety                   Toilet Transfer: Min guard;Ambulation;BSC       Tub/ Shower Transfer: Walk-in shower;Min guard;Ambulation     General ADL Comments: pt performed transfers with min guard for safety; cues for sequence and walker distance.      Vision                     Perception     Praxis      Cognition   Behavior During Therapy: WFL for tasks assessed/performed Overall Cognitive Status: Within Functional Limits for tasks assessed                       Extremity/Trunk Assessment               Exercises     Shoulder Instructions       General Comments      Pertinent Vitals/ Pain       Pain Score: 2  Pain Location: R knee Pain Descriptors / Indicators: Aching Pain Intervention(s): Limited activity within patient's tolerance;Monitored during session;Premedicated before session;Repositioned;Ice applied  Home Living                                           Prior Functioning/Environment              Frequency       Progress Toward Goals  OT Goals(current goals can now be found in the care plan section)  Progress towards OT goals: Progressing toward goals     Plan      Co-evaluation                 End of Session     Activity Tolerance Patient tolerated treatment well   Patient Left in chair;with call bell/phone within reach   Nurse Communication          Time: 4132-4401 OT Time Calculation (min): 22 min  Charges: OT General Charges $OT Visit: 1 Procedure OT Treatments $Self Care/Home Management : 8-22 mins  Rue Tinnel 07/27/2014, 8:48 AM   Roselee Nova  Karleen Hampshire, OTR/L [phone] 07/27/2014

## 2014-07-27 NOTE — Discharge Summary (Signed)
Physician Discharge Summary   Patient ID: Sheila Ray MRN: [id number] DOB/AGE: [birth year] 74 y.o.  Admit date: 07/25/2014 Discharge date: 07/27/2014  Primary Diagnosis:  Osteoarthritis Right knee(s) Admission Diagnoses:  Past Medical History  Diagnosis Date  . Hypertension   . Goiter   . Obese   . Osteopenia   . Fatigue   . Gastric polyp   . Diverticulosis   . Rosacea   . Postmenopausal   . Vitamin D deficiency   . Hyperlipidemia   . Diabetes mellitus without complication   . 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 Complication of anesthesia   . PONV (postoperative nausea and vomiting)   . Hypothyroid     taken off of thyroid medication 2012014   . Arthritis    Discharge Diagnoses:   Principal Problem:   OA (osteoarthritis) of knee  Estimated body mass index is 32.25 kg/(m^2) as calculated from the following:   Height as of this encounter: 5' 4"  (1.626 m).   Weight as of this encounter: 85.276 kg (188 lb).  Procedure:  Procedure(s) (LRB): RIGHT TOTAL KNEE ARTHROPLASTY (Right)   Consults: None  HPI: Sheila Ray is a 74 y.o. year old female with end stage OA of her right knee with progressively worsening pain and dysfunction. She has constant pain, with activity and at rest and significant functional deficits with difficulties even with ADLs. She has had extensive non-op management including analgesics, injections of cortisone and viscosupplements, and home exercise program, but remains in significant pain with significant dysfunction.Radiographs show bone on bone arthritis medial and patellofemoral. She presents now for right Total Knee Arthroplasty.  Laboratory Data: Admission on 07/25/2014, Discharged on 07/27/2014  Component Date Value Ref Range Status  . Glucose-Capillary 07/25/2014 122* 70 - 99 mg/dL Final  . Comment 1 07/25/2014 Notify RN   Final  . ABO/RH(D) 07/25/2014 A POS   Final  . Antibody Screen 07/25/2014 NEG   Final  .  Sample Expiration 07/25/2014 07/28/2014   Final  . ABO/RH(D) 07/25/2014 A POS   Final  . Glucose-Capillary 07/25/2014 101* 70 - 99 mg/dL Final  . Glucose-Capillary 07/25/2014 180* 70 - 99 mg/dL Final  . WBC 07/26/2014 9.3  4.0 - 10.5 K/uL Final  . RBC 07/26/2014 4.34  3.87 - 5.11 MIL/uL Final  . Hemoglobin 07/26/2014 12.2  12.0 - 15.0 g/dL Final  . HCT 07/26/2014 38.1  36.0 - 46.0 % Final  . MCV 07/26/2014 87.8  78.0 - 100.0 fL Final  . MCH 07/26/2014 28.1  26.0 - 34.0 pg Final  . MCHC 07/26/2014 32.0  30.0 - 36.0 g/dL Final  . RDW 07/26/2014 13.0  11.5 - 15.5 % Final  . Platelets 07/26/2014 215  150 - 400 K/uL Final  . Sodium 07/26/2014 138  135 - 145 mmol/L Final  . Potassium 07/26/2014 3.8  3.5 - 5.1 mmol/L Final  . Chloride 07/26/2014 105  96 - 112 mmol/L Final  . CO2 07/26/2014 26  19 - 32 mmol/L Final  . Glucose, Bld 07/26/2014 113* 70 - 99 mg/dL Final  . BUN 07/26/2014 12  6 - 23 mg/dL Final  . Creatinine, Ser 07/26/2014 0.68  0.50 - 1.10 mg/dL Final  . Calcium 07/26/2014 8.5  8.4 - 10.5 mg/dL Final  . GFR calc non Af Amer 07/26/2014 85* >90 mL/min Final  . GFR calc Af Amer 07/26/2014 >90  >90 mL/min Final   Comment: (NOTE) The eGFR has been calculated  using the CKD EPI equation. This calculation has not been validated in all clinical situations. eGFR's persistently <90 mL/min signify possible Chronic Kidney Disease.   . Anion gap 07/26/2014 7  5 - 15 Final  . Glucose-Capillary 07/25/2014 168* 70 - 99 mg/dL Final  . Comment 1 07/25/2014 Documented in Chart   Final  . Comment 2 07/25/2014 Notify RN   Final  . Glucose-Capillary 07/26/2014 118* 70 - 99 mg/dL Final  . Glucose-Capillary 07/26/2014 144* 70 - 99 mg/dL Final  . WBC 07/27/2014 13.9* 4.0 - 10.5 K/uL Final  . RBC 07/27/2014 4.15  3.87 - 5.11 MIL/uL Final  . Hemoglobin 07/27/2014 11.7* 12.0 - 15.0 g/dL Final  . HCT 07/27/2014 35.9* 36.0 - 46.0 % Final  . MCV 07/27/2014 86.5  78.0 - 100.0 fL Final  . MCH 07/27/2014  28.2  26.0 - 34.0 pg Final  . MCHC 07/27/2014 32.6  30.0 - 36.0 g/dL Final  . RDW 07/27/2014 12.9  11.5 - 15.5 % Final  . Platelets 07/27/2014 228  150 - 400 K/uL Final  . Sodium 07/27/2014 137  135 - 145 mmol/L Final  . Potassium 07/27/2014 3.9  3.5 - 5.1 mmol/L Final  . Chloride 07/27/2014 104  96 - 112 mmol/L Final  . CO2 07/27/2014 26  19 - 32 mmol/L Final  . Glucose, Bld 07/27/2014 145* 70 - 99 mg/dL Final  . BUN 07/27/2014 12  6 - 23 mg/dL Final  . Creatinine, Ser 07/27/2014 0.65  0.50 - 1.10 mg/dL Final  . Calcium 07/27/2014 9.0  8.4 - 10.5 mg/dL Final  . GFR calc non Af Amer 07/27/2014 86* >90 mL/min Final  . GFR calc Af Amer 07/27/2014 >90  >90 mL/min Final   Comment: (NOTE) The eGFR has been calculated using the CKD EPI equation. This calculation has not been validated in all clinical situations. eGFR's persistently <90 mL/min signify possible Chronic Kidney Disease.   . Anion gap 07/27/2014 7  5 - 15 Final  . Glucose-Capillary 07/26/2014 257* 70 - 99 mg/dL Final  . Glucose-Capillary 07/26/2014 177* 70 - 99 mg/dL Final  . Glucose-Capillary 07/27/2014 122* 70 - 99 mg/dL Final  Hospital Outpatient Visit on 07/19/2014  Component Date Value Ref Range Status  . aPTT 07/19/2014 29  24 - 37 seconds Final  . WBC 07/19/2014 7.8  4.0 - 10.5 K/uL Final  . RBC 07/19/2014 5.09  3.87 - 5.11 MIL/uL Final  . Hemoglobin 07/19/2014 14.6  12.0 - 15.0 g/dL Final  . HCT 07/19/2014 44.7  36.0 - 46.0 % Final  . MCV 07/19/2014 87.8  78.0 - 100.0 fL Final  . MCH 07/19/2014 28.7  26.0 - 34.0 pg Final  . MCHC 07/19/2014 32.7  30.0 - 36.0 g/dL Final  . RDW 07/19/2014 13.1  11.5 - 15.5 % Final  . Platelets 07/19/2014 286  150 - 400 K/uL Final  . Sodium 07/19/2014 140  135 - 145 mmol/L Final   Please note change in reference range.  . Potassium 07/19/2014 4.2  3.5 - 5.1 mmol/L Final   Please note change in reference range.  . Chloride 07/19/2014 108  96 - 112 mEq/L Final  . CO2 07/19/2014 27  19  - 32 mmol/L Final  . Glucose, Bld 07/19/2014 126* 70 - 99 mg/dL Final  . BUN 07/19/2014 16  6 - 23 mg/dL Final  . Creatinine, Ser 07/19/2014 0.78  0.50 - 1.10 mg/dL Final  . Calcium 07/19/2014 9.5  8.4 - 10.5 mg/dL  Final  . Total Protein 07/19/2014 7.0  6.0 - 8.3 g/dL Final  . Albumin 07/19/2014 4.0  3.5 - 5.2 g/dL Final  . AST 07/19/2014 22  0 - 37 U/L Final  . ALT 07/19/2014 19  0 - 35 U/L Final  . Alkaline Phosphatase 07/19/2014 62  39 - 117 U/L Final  . Total Bilirubin 07/19/2014 0.6  0.3 - 1.2 mg/dL Final  . GFR calc non Af Amer 07/19/2014 81* >90 mL/min Final  . GFR calc Af Amer 07/19/2014 >90  >90 mL/min Final   Comment: (NOTE) The eGFR has been calculated using the CKD EPI equation. This calculation has not been validated in all clinical situations. eGFR's persistently <90 mL/min signify possible Chronic Kidney Disease.   . Anion gap 07/19/2014 5  5 - 15 Final  . Prothrombin Time 07/19/2014 12.7  11.6 - 15.2 seconds Final  . INR 07/19/2014 0.94  0.00 - 1.49 Final  . Color, Urine 07/19/2014 YELLOW  YELLOW Final  . APPearance 07/19/2014 CLEAR  CLEAR Final  . Specific Gravity, Urine 07/19/2014 1.005  1.005 - 1.030 Final  . pH 07/19/2014 6.0  5.0 - 8.0 Final  . Glucose, UA 07/19/2014 NEGATIVE  NEGATIVE mg/dL Final  . Hgb urine dipstick 07/19/2014 NEGATIVE  NEGATIVE Final  . Bilirubin Urine 07/19/2014 NEGATIVE  NEGATIVE Final  . Ketones, ur 07/19/2014 NEGATIVE  NEGATIVE mg/dL Final  . Protein, ur 07/19/2014 NEGATIVE  NEGATIVE mg/dL Final  . Urobilinogen, UA 07/19/2014 0.2  0.0 - 1.0 mg/dL Final  . Nitrite 07/19/2014 NEGATIVE  NEGATIVE Final  . Leukocytes, UA 07/19/2014 TRACE* NEGATIVE Final  . MRSA, PCR 07/19/2014 NEGATIVE  NEGATIVE Final  . Staphylococcus aureus 07/19/2014 NEGATIVE  NEGATIVE Final   Comment:        The Xpert SA Assay (FDA approved for NASAL specimens in patients over 56 years of age), is one component of a comprehensive surveillance program.  Test  performance has been validated by [facility] for patients greater than or equal to 56 year old. It is not intended to diagnose infection nor to guide or monitor treatment.   . Squamous Epithelial / LPF 07/19/2014 RARE  RARE Final  . WBC, UA 07/19/2014 0-2  <3 WBC/hpf Final  . Bacteria, UA 07/19/2014 RARE  RARE Final  . Edwina Barth 07/19/2014 MUCOUS PRESENT   Final     X-Rays:No results found.  EKG: Orders placed or performed in visit on 06/29/14  . EKG 12-Lead     Hospital Course: Gianna L Haislip is a 74 y.o. who was admitted to [facility]. They were brought to the operating room on 07/25/2014 and underwent Procedure(s): RIGHT TOTAL KNEE ARTHROPLASTY.  Patient tolerated the procedure well and was later transferred to the recovery room and then to the orthopaedic floor for postoperative care.  They were given PO and IV analgesics for pain control following their surgery.  They were given 24 hours of postoperative antibiotics of  Anti-infectives    Start     Dose/Rate Route Frequency Ordered Stop   07/25/14 1800  ceFAZolin (ANCEF) IVPB 2 g/50 mL premix     2 g100 mL/hr over 30 Minutes Intravenous Every 6 hours 07/25/14 1342 07/26/14 0038   07/25/14 0834  ceFAZolin (ANCEF) IVPB 2 g/50 mL premix     2 g100 mL/hr over 30 Minutes Intravenous On call to O.R. 07/25/14 5859 07/25/14 1115     and started on DVT prophylaxis in the form of Xarelto.   PT and  OT were ordered for total joint protocol.  Discharge planning consulted to help with postop disposition and equipment needs.  Patient had a great night on the evening of surgery and was able to get some rest.  They started to get up OOB with therapy on day one. Hemovac drain was pulled without difficulty.  Continued to work with therapy into day two.  Dressing was changed on day two and the incision was healing well.  Patient was seen in rounds on day two and was ready to go home.  Discharge home with home health Diet - Cardiac diet  and Diabetic diet Follow up - in 2 weeks Activity - WBAT Disposition - Home Condition Upon Discharge - Good D/C Meds - See DC Summary DVT Prophylaxis - Xarelto      Discharge Instructions    Call MD / Call 911    Complete by:  As directed   If you experience chest pain or shortness of breath, CALL 911 and be transported to the hospital emergency room.  If you develope a fever above 101 F, pus (white drainage) or increased drainage or redness at the wound, or calf pain, call your surgeon's office.     Change dressing    Complete by:  As directed   Change dressing daily with sterile 4 x 4 inch gauze dressing and apply TED hose. Do not submerge the incision under water.     Constipation Prevention    Complete by:  As directed   Drink plenty of fluids.  Prune juice may be helpful.  You may use a stool softener, such as Colace (over the counter) 100 mg twice a day.  Use MiraLax (over the counter) for constipation as needed.     Diet - low sodium heart healthy    Complete by:  As directed      Diet Carb Modified    Complete by:  As directed      Discharge instructions    Complete by:  As directed   Pick up stool softner and laxative for home use following surgery while on pain medications. Do not submerge incision under water. Please use good hand washing techniques while changing dressing each day. May shower starting three days after surgery. Please use a clean towel to pat the incision dry following showers. Continue to use ice for pain and swelling after surgery. Do not use any lotions or creams on the incision until instructed by your surgeon.  Take Xarelto for two and a half more weeks, then discontinue Xarelto. Once the patient has completed the Xarelto, they may resume the 325 mg Aspirin.  Postoperative Constipation Protocol  Constipation - defined medically as fewer than three stools per week and severe constipation as less than one stool per week.  One of the most common  issues patients have following surgery is constipation.  Even if you have a regular bowel pattern at home, your normal regimen is likely to be disrupted due to multiple reasons following surgery.  Combination of anesthesia, postoperative narcotics, change in appetite and fluid intake all can affect your bowels.  In order to avoid complications following surgery, here are some recommendations in order to help you during your recovery period.  Colace (docusate) - Pick up an over-the-counter form of Colace or another stool softener and take twice a day as long as you are requiring postoperative pain medications.  Take with a full glass of water daily.  If you experience loose stools or diarrhea, hold  the colace until you stool forms back up.  If your symptoms do not get better within 1 week or if they get worse, check with your doctor.  Dulcolax (bisacodyl) - Pick up over-the-counter and take as directed by the product packaging as needed to assist with the movement of your bowels.  Take with a full glass of water.  Use this product as needed if not relieved by Colace only.   MiraLax (polyethylene glycol) - Pick up over-the-counter to have on hand.  MiraLax is a solution that will increase the amount of water in your bowels to assist with bowel movements.  Take as directed and can mix with a glass of water, juice, soda, coffee, or tea.  Take if you go more than two days without a movement. Do not use MiraLax more than once per day. Call your doctor if you are still constipated or irregular after using this medication for 7 days in a row.  If you continue to have problems with postoperative constipation, please contact the office for further assistance and recommendations.  If you experience "the worst abdominal pain ever" or develop nausea or vomiting, please contact the office immediatly for further recommendations for treatment.     Do not put a pillow under the knee. Place it under the heel.    Complete  by:  As directed      Do not sit on low chairs, stoools or toilet seats, as it may be difficult to get up from low surfaces    Complete by:  As directed      Driving restrictions    Complete by:  As directed   No driving until released by the physician.     Increase activity slowly as tolerated    Complete by:  As directed      Lifting restrictions    Complete by:  As directed   No lifting until released by the physician.     Patient may shower    Complete by:  As directed   You may shower without a dressing once there is no drainage.  Do not wash over the wound.  If drainage remains, do not shower until drainage stops.     TED hose    Complete by:  As directed   Use stockings (TED hose) for 3 weeks on both leg(s).  You may remove them at night for sleeping.     Weight bearing as tolerated    Complete by:  As directed             Medication List    STOP taking these medications        aspirin EC 325 MG tablet     Vitamin D (Ergocalciferol) 50000 UNITS Caps capsule  Commonly known as:  DRISDOL      TAKE these medications        amLODipine 5 MG tablet  Commonly known as:  NORVASC  Take 1 tablet (5 mg total) by mouth daily.     lisinopril 40 MG tablet  Commonly known as:  PRINIVIL,ZESTRIL  Take 40 mg by mouth daily.     metFORMIN 1000 MG tablet  Commonly known as:  GLUCOPHAGE  TAKE ONE TABLET BY MOUTH with breaksfast     methocarbamol 500 MG tablet  Commonly known as:  ROBAXIN  Take 1 tablet (500 mg total) by mouth every 6 (six) hours as needed for muscle spasms.     ONE TOUCH ULTRA TEST test strip  Generic  drug:  glucose blood  USE TO CHECK BLOOD GLUCOSE ONCE A DAY AS INSTRUCTED     ONETOUCH DELICA LANCETS 74G Misc  - 1 each by Does not apply route daily. Use to check BG daily  - Dx:  250.02     oxyCODONE 5 MG immediate release tablet  Commonly known as:  Oxy IR/ROXICODONE  Take 1-2 tablets (5-10 mg total) by mouth every 3 (three) hours as needed for  moderate pain, severe pain or breakthrough pain.     PRESCRIPTION MEDICATION  Apply 1 application topically 2 (two) times daily as needed (Applied under breasts for irritation). Triamcinolone 0.1% and SSD 3:1     rivaroxaban 10 MG Tabs tablet  Commonly known as:  XARELTO  - Take 1 tablet (10 mg total) by mouth daily with breakfast. Take Xarelto for two and a half more weeks, then discontinue Xarelto.  - Once the patient has completed the Xarelto, they may resume the 325 mg Aspirin.     traMADol 50 MG tablet  Commonly known as:  ULTRAM  Take 1-2 tablets (50-100 mg total) by mouth every 6 (six) hours as needed (mild pain).     triamcinolone cream 0.1 %  Commonly known as:  KENALOG  Apply 1 application topically 2 (two) times daily as needed (Apply under breasts when needed for irritation).       Follow-up Information    Follow up with [facility].   Why:  home health physical therapy   Contact information:   [address] [place] [address] [phone]       Follow up with [facility].   Why:  3n1 (commode)   Contact information:   [address] [place] [address] [phone]       Follow up with Gearlean Alf, MD. Schedule an appointment as soon as possible for a visit on 08/09/2014.   Specialty:  Orthopedic Surgery   Why:  Call [phone] tomorrow to make the appointment   Contact information:   [address] [place] [address] [phone]       Signed: Arlee Muslim, PA-C Orthopaedic Surgery 08/02/2014, 11:02 AM

## 2014-07-27 NOTE — Progress Notes (Signed)
   Subjective: 2 Days Post-Op Procedure(s) (LRB): RIGHT TOTAL KNEE ARTHROPLASTY (Right) Patient reports pain as mild.   Patient seen in rounds by Dr. Wynelle Link. Patient is well, and has had no acute complaints or problems Patient is ready to go home  Objective: Vital signs in last 24 hours: Temp:  [98.6 F (37 C)-99.6 F (37.6 C)] 98.8 F (37.1 C) (01/27 0528) Pulse Rate:  [68-114] 77 (01/27 0528) Resp:  [14-18] 16 (01/27 0528) BP: (129-155)/(57-66) 129/59 mmHg (01/27 0528) SpO2:  [92 %-95 %] 94 % (01/27 0528)  Intake/Output from previous day:  Intake/Output Summary (Last 24 hours) at 07/27/14 0916 Last data filed at 07/27/14 0845  Gross per 24 hour  Intake    240 ml  Output   1900 ml  Net  -1660 ml    Intake/Output this shift: Total I/O In: 240 [P.O.:240] Out: -   Labs:  Recent Labs  07/26/14 0540 07/27/14 0436  HGB 12.2 11.7*    Recent Labs  07/26/14 0540 07/27/14 0436  WBC 9.3 13.9*  RBC 4.34 4.15  HCT 38.1 35.9*  PLT 215 228    Recent Labs  07/26/14 0540 07/27/14 0436  NA 138 137  K 3.8 3.9  CL 105 104  CO2 26 26  BUN 12 12  CREATININE 0.68 0.65  GLUCOSE 113* 145*  CALCIUM 8.5 9.0   No results for input(s): LABPT, INR in the last 72 hours.  EXAM: General - Patient is Alert, Appropriate and Oriented Extremity - Neurovascular intact Sensation intact distally Incision - clean, dry, no drainage Motor Function - intact, moving foot and toes well on exam.   Assessment/Plan: 2 Days Post-Op Procedure(s) (LRB): RIGHT TOTAL KNEE ARTHROPLASTY (Right) Procedure(s) (LRB): RIGHT TOTAL KNEE ARTHROPLASTY (Right) Past Medical History  Diagnosis Date  . Hypertension   . Goiter   . Obese   . Osteopenia   . Fatigue   . Gastric polyp   . Diverticulosis   . Rosacea   . Postmenopausal   . Vitamin D deficiency   . Hyperlipidemia   . Diabetes mellitus without complication   . 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  Complication of anesthesia   . PONV (postoperative nausea and vomiting)   . Hypothyroid     taken off of thyroid medication 2012014   . Arthritis    Principal Problem:   OA (osteoarthritis) of knee  Estimated body mass index is 32.25 kg/(m^2) as calculated from the following:   Height as of this encounter: 5\' 4"  (1.626 m).   Weight as of this encounter: 85.276 kg (188 lb). D/C IV fluids Discharge home with home health Diet - Cardiac diet and Diabetic diet Follow up - in 2 weeks Activity - WBAT Disposition - Home Condition Upon Discharge - Good D/C Meds - See DC Summary DVT Prophylaxis - Xarelto  Arlee Muslim, PA-C Orthopaedic Surgery 07/27/2014, 9:16 AM

## 2014-07-27 NOTE — Progress Notes (Signed)
Physical Therapy Treatment Patient Details Name: Sheila Ray MRN: [id number] DOB: Aug 20, 1940 Today's Date: 07/27/2014    History of Present Illness Pt is a 74 year old female s/p R TKA.    PT Comments    Pt ambulated in hallway and performed one step.  Pt also performed exercises once back in recliner.  Pt provided with HEP handout.  Son came to take pt home so returned to room to verbally discuss safe stair technique.  Pt and son had no further questions.  Pt feels ready to d/c home today.   Follow Up Recommendations  Home health PT     Equipment Recommendations  None recommended by PT    Recommendations for Other Services       Precautions / Restrictions Precautions Precautions: Knee Required Braces or Orthoses: Knee Immobilizer - Right Restrictions Weight Bearing Restrictions: No Other Position/Activity Restrictions: WBAT    Mobility  Bed Mobility               General bed mobility comments: pt in chair  Transfers Overall transfer level: Needs assistance Equipment used: Rolling walker (2 wheeled) Transfers: Sit to/from Stand Sit to Stand: Supervision         General transfer comment: verbal cues for positioning  Ambulation/Gait Ambulation/Gait assistance: Min guard Ambulation Distance (Feet): 180 Feet Assistive device: Rolling walker (2 wheeled) Gait Pattern/deviations: Step-through pattern;Antalgic;Trunk flexed     General Gait Details: verbal cues for sequence, RW distance, posture, step length, progressed to step through pattern   Stairs Stairs: Yes Stairs assistance: Min guard Stair Management: Step to pattern;Forwards;With walker Number of Stairs: 1 General stair comments: verbal cues for sequence, RW positioning, safety, performed one step twice, when son arrived to take pt home technique was verbally reviewed  Wheelchair Mobility    Modified Rankin (Stroke Patients Only)       Balance                                    Cognition Arousal/Alertness: Awake/alert Behavior During Therapy: WFL for tasks assessed/performed Overall Cognitive Status: Within Functional Limits for tasks assessed                      Exercises Total Joint Exercises Ankle Circles/Pumps: AROM;Both;15 reps Quad Sets: AROM;Both;15 reps Towel Squeeze: AROM;Both;10 reps Short Arc QuadBarbaraann Boys;Right;10 reps Heel Slides: AAROM;Right;10 reps Hip ABduction/ADduction: AROM;Right;10 reps Straight Leg Raises: AAROM;Right;10 reps    General Comments        Pertinent Vitals/Pain Pain Assessment: 0-10 Pain Score: 3  Pain Location: R knee Pain Descriptors / Indicators: Sore;Aching Pain Intervention(s): Limited activity within patient's tolerance;Monitored during session;Repositioned;Ice applied    Home Living                      Prior Function            PT Goals (current goals can now be found in the care plan section) Progress towards PT goals: Progressing toward goals    Frequency  7X/week    PT Plan Current plan remains appropriate    Co-evaluation             End of Session Equipment Utilized During Treatment: Right knee immobilizer Activity Tolerance: Patient tolerated treatment well Patient left: with call bell/phone within reach;in chair     Time: 6301-6010 PT Time Calculation (min) (ACUTE ONLY): 27 min  Charges:  $Gait Training: 8-22 mins $Therapeutic Exercise: 8-22 mins                    G Codes:      Tylie Golonka,KATHrine E 08/23/14, 11:55 AM Zenovia Jarred, PT, DPT 23-Aug-2014 Pager: [phone]

## 2014-07-28 DIAGNOSIS — E119 Type 2 diabetes mellitus without complications: Secondary | ICD-10-CM | POA: Diagnosis not present

## 2014-07-28 DIAGNOSIS — Z471 Aftercare following joint replacement surgery: Secondary | ICD-10-CM | POA: Diagnosis not present

## 2014-07-28 DIAGNOSIS — Z96651 Presence of right artificial knee joint: Secondary | ICD-10-CM | POA: Diagnosis not present

## 2014-07-28 DIAGNOSIS — I10 Essential (primary) hypertension: Secondary | ICD-10-CM | POA: Diagnosis not present

## 2014-07-28 DIAGNOSIS — E669 Obesity, unspecified: Secondary | ICD-10-CM | POA: Diagnosis not present

## 2014-07-28 DIAGNOSIS — Z7901 Long term (current) use of anticoagulants: Secondary | ICD-10-CM | POA: Diagnosis not present

## 2014-08-01 DIAGNOSIS — I10 Essential (primary) hypertension: Secondary | ICD-10-CM | POA: Diagnosis not present

## 2014-08-01 DIAGNOSIS — Z471 Aftercare following joint replacement surgery: Secondary | ICD-10-CM | POA: Diagnosis not present

## 2014-08-01 DIAGNOSIS — E669 Obesity, unspecified: Secondary | ICD-10-CM | POA: Diagnosis not present

## 2014-08-01 DIAGNOSIS — E119 Type 2 diabetes mellitus without complications: Secondary | ICD-10-CM | POA: Diagnosis not present

## 2014-08-01 DIAGNOSIS — Z7901 Long term (current) use of anticoagulants: Secondary | ICD-10-CM | POA: Diagnosis not present

## 2014-08-01 DIAGNOSIS — Z96651 Presence of right artificial knee joint: Secondary | ICD-10-CM | POA: Diagnosis not present

## 2014-08-02 DIAGNOSIS — Z7901 Long term (current) use of anticoagulants: Secondary | ICD-10-CM | POA: Diagnosis not present

## 2014-08-02 DIAGNOSIS — Z96651 Presence of right artificial knee joint: Secondary | ICD-10-CM | POA: Diagnosis not present

## 2014-08-02 DIAGNOSIS — E119 Type 2 diabetes mellitus without complications: Secondary | ICD-10-CM | POA: Diagnosis not present

## 2014-08-02 DIAGNOSIS — E669 Obesity, unspecified: Secondary | ICD-10-CM | POA: Diagnosis not present

## 2014-08-02 DIAGNOSIS — I10 Essential (primary) hypertension: Secondary | ICD-10-CM | POA: Diagnosis not present

## 2014-08-02 DIAGNOSIS — Z471 Aftercare following joint replacement surgery: Secondary | ICD-10-CM | POA: Diagnosis not present

## 2014-08-03 DIAGNOSIS — Z471 Aftercare following joint replacement surgery: Secondary | ICD-10-CM | POA: Diagnosis not present

## 2014-08-03 DIAGNOSIS — E669 Obesity, unspecified: Secondary | ICD-10-CM | POA: Diagnosis not present

## 2014-08-03 DIAGNOSIS — Z96651 Presence of right artificial knee joint: Secondary | ICD-10-CM | POA: Diagnosis not present

## 2014-08-03 DIAGNOSIS — Z7901 Long term (current) use of anticoagulants: Secondary | ICD-10-CM | POA: Diagnosis not present

## 2014-08-03 DIAGNOSIS — E119 Type 2 diabetes mellitus without complications: Secondary | ICD-10-CM | POA: Diagnosis not present

## 2014-08-03 DIAGNOSIS — I10 Essential (primary) hypertension: Secondary | ICD-10-CM | POA: Diagnosis not present

## 2014-08-04 DIAGNOSIS — E119 Type 2 diabetes mellitus without complications: Secondary | ICD-10-CM | POA: Diagnosis not present

## 2014-08-04 DIAGNOSIS — Z96651 Presence of right artificial knee joint: Secondary | ICD-10-CM | POA: Diagnosis not present

## 2014-08-04 DIAGNOSIS — Z471 Aftercare following joint replacement surgery: Secondary | ICD-10-CM | POA: Diagnosis not present

## 2014-08-04 DIAGNOSIS — Z7901 Long term (current) use of anticoagulants: Secondary | ICD-10-CM | POA: Diagnosis not present

## 2014-08-04 DIAGNOSIS — I10 Essential (primary) hypertension: Secondary | ICD-10-CM | POA: Diagnosis not present

## 2014-08-04 DIAGNOSIS — E669 Obesity, unspecified: Secondary | ICD-10-CM | POA: Diagnosis not present

## 2014-08-05 DIAGNOSIS — Z7901 Long term (current) use of anticoagulants: Secondary | ICD-10-CM | POA: Diagnosis not present

## 2014-08-05 DIAGNOSIS — Z96651 Presence of right artificial knee joint: Secondary | ICD-10-CM | POA: Diagnosis not present

## 2014-08-05 DIAGNOSIS — I10 Essential (primary) hypertension: Secondary | ICD-10-CM | POA: Diagnosis not present

## 2014-08-05 DIAGNOSIS — E119 Type 2 diabetes mellitus without complications: Secondary | ICD-10-CM | POA: Diagnosis not present

## 2014-08-05 DIAGNOSIS — E669 Obesity, unspecified: Secondary | ICD-10-CM | POA: Diagnosis not present

## 2014-08-05 DIAGNOSIS — Z471 Aftercare following joint replacement surgery: Secondary | ICD-10-CM | POA: Diagnosis not present

## 2014-08-08 DIAGNOSIS — Z96651 Presence of right artificial knee joint: Secondary | ICD-10-CM | POA: Diagnosis not present

## 2014-08-08 DIAGNOSIS — I10 Essential (primary) hypertension: Secondary | ICD-10-CM | POA: Diagnosis not present

## 2014-08-08 DIAGNOSIS — Z7901 Long term (current) use of anticoagulants: Secondary | ICD-10-CM | POA: Diagnosis not present

## 2014-08-08 DIAGNOSIS — Z471 Aftercare following joint replacement surgery: Secondary | ICD-10-CM | POA: Diagnosis not present

## 2014-08-08 DIAGNOSIS — E119 Type 2 diabetes mellitus without complications: Secondary | ICD-10-CM | POA: Diagnosis not present

## 2014-08-08 DIAGNOSIS — E669 Obesity, unspecified: Secondary | ICD-10-CM | POA: Diagnosis not present

## 2014-08-10 DIAGNOSIS — Z96651 Presence of right artificial knee joint: Secondary | ICD-10-CM | POA: Diagnosis not present

## 2014-08-10 DIAGNOSIS — E669 Obesity, unspecified: Secondary | ICD-10-CM | POA: Diagnosis not present

## 2014-08-10 DIAGNOSIS — E119 Type 2 diabetes mellitus without complications: Secondary | ICD-10-CM | POA: Diagnosis not present

## 2014-08-10 DIAGNOSIS — Z7901 Long term (current) use of anticoagulants: Secondary | ICD-10-CM | POA: Diagnosis not present

## 2014-08-10 DIAGNOSIS — Z471 Aftercare following joint replacement surgery: Secondary | ICD-10-CM | POA: Diagnosis not present

## 2014-08-10 DIAGNOSIS — I10 Essential (primary) hypertension: Secondary | ICD-10-CM | POA: Diagnosis not present

## 2014-08-12 DIAGNOSIS — I10 Essential (primary) hypertension: Secondary | ICD-10-CM | POA: Diagnosis not present

## 2014-08-12 DIAGNOSIS — Z96651 Presence of right artificial knee joint: Secondary | ICD-10-CM | POA: Diagnosis not present

## 2014-08-12 DIAGNOSIS — E669 Obesity, unspecified: Secondary | ICD-10-CM | POA: Diagnosis not present

## 2014-08-12 DIAGNOSIS — Z7901 Long term (current) use of anticoagulants: Secondary | ICD-10-CM | POA: Diagnosis not present

## 2014-08-12 DIAGNOSIS — E119 Type 2 diabetes mellitus without complications: Secondary | ICD-10-CM | POA: Diagnosis not present

## 2014-08-12 DIAGNOSIS — Z471 Aftercare following joint replacement surgery: Secondary | ICD-10-CM | POA: Diagnosis not present

## 2014-08-14 DIAGNOSIS — Z471 Aftercare following joint replacement surgery: Secondary | ICD-10-CM | POA: Diagnosis not present

## 2014-08-14 DIAGNOSIS — Z7901 Long term (current) use of anticoagulants: Secondary | ICD-10-CM | POA: Diagnosis not present

## 2014-08-14 DIAGNOSIS — Z96651 Presence of right artificial knee joint: Secondary | ICD-10-CM | POA: Diagnosis not present

## 2014-08-14 DIAGNOSIS — E119 Type 2 diabetes mellitus without complications: Secondary | ICD-10-CM | POA: Diagnosis not present

## 2014-08-14 DIAGNOSIS — E669 Obesity, unspecified: Secondary | ICD-10-CM | POA: Diagnosis not present

## 2014-08-14 DIAGNOSIS — I10 Essential (primary) hypertension: Secondary | ICD-10-CM | POA: Diagnosis not present

## 2014-08-17 DIAGNOSIS — I10 Essential (primary) hypertension: Secondary | ICD-10-CM | POA: Diagnosis not present

## 2014-08-17 DIAGNOSIS — Z7901 Long term (current) use of anticoagulants: Secondary | ICD-10-CM | POA: Diagnosis not present

## 2014-08-17 DIAGNOSIS — E669 Obesity, unspecified: Secondary | ICD-10-CM | POA: Diagnosis not present

## 2014-08-17 DIAGNOSIS — E119 Type 2 diabetes mellitus without complications: Secondary | ICD-10-CM | POA: Diagnosis not present

## 2014-08-17 DIAGNOSIS — Z471 Aftercare following joint replacement surgery: Secondary | ICD-10-CM | POA: Diagnosis not present

## 2014-08-17 DIAGNOSIS — Z96651 Presence of right artificial knee joint: Secondary | ICD-10-CM | POA: Diagnosis not present

## 2014-08-18 DIAGNOSIS — Z471 Aftercare following joint replacement surgery: Secondary | ICD-10-CM | POA: Diagnosis not present

## 2014-08-18 DIAGNOSIS — E119 Type 2 diabetes mellitus without complications: Secondary | ICD-10-CM | POA: Diagnosis not present

## 2014-08-18 DIAGNOSIS — Z7901 Long term (current) use of anticoagulants: Secondary | ICD-10-CM | POA: Diagnosis not present

## 2014-08-18 DIAGNOSIS — Z96651 Presence of right artificial knee joint: Secondary | ICD-10-CM | POA: Diagnosis not present

## 2014-08-18 DIAGNOSIS — E669 Obesity, unspecified: Secondary | ICD-10-CM | POA: Diagnosis not present

## 2014-08-18 DIAGNOSIS — I10 Essential (primary) hypertension: Secondary | ICD-10-CM | POA: Diagnosis not present

## 2014-09-01 ENCOUNTER — Ambulatory Visit: Payer: Medicare Other | Attending: Orthopedic Surgery | Admitting: Physical Therapy

## 2014-09-01 DIAGNOSIS — M25661 Stiffness of right knee, not elsewhere classified: Secondary | ICD-10-CM

## 2014-09-01 DIAGNOSIS — M25561 Pain in right knee: Secondary | ICD-10-CM | POA: Diagnosis not present

## 2014-09-01 NOTE — Therapy (Signed)
[facility] [place], [place], [address] Phone: [phone]   Fax:  [phone]  Physical Therapy Evaluation  Patient Details  Name: Sheila Ray MRN: [id number] Date of Birth: 1941-05-02 Referring Provider:  Gearlean Alf, MD  Encounter Date: 09/01/2014      PT End of Session - 09/01/14 1134    Visit Number 1   Number of Visits 12   PT Start Time 1125   PT Stop Time 1223   PT Time Calculation (min) 58 min   Activity Tolerance Patient tolerated treatment well   Behavior During Therapy [facility] for tasks assessed/performed      Past Medical History  Diagnosis Date  . Hypertension   . Goiter   . Obese   . Osteopenia   . Fatigue   . Gastric polyp   . Diverticulosis   . Rosacea   . Postmenopausal   . Vitamin D deficiency   . Hyperlipidemia   . Diabetes mellitus without complication   . 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 Complication of anesthesia   . PONV (postoperative nausea and vomiting)   . Hypothyroid     taken off of thyroid medication 2012014   . Arthritis     Past Surgical History  Procedure Laterality Date  . Abdominal hysterectomy  1985  . Foot surgery Right 08/2002  . Tonsillectomy    . Right knee arthroscopy   2013   . Total knee arthroplasty Right 07/25/2014    Procedure: RIGHT TOTAL KNEE ARTHROPLASTY;  Surgeon: Gearlean Alf, MD;  Location: [facility];  Service: Orthopedics;  Laterality: Right;    There were no vitals taken for this visit.  Visit Diagnosis:  Right knee pain - Plan: PT plan of care cert/re-cert  Knee stiffness, right - Plan: PT plan of care cert/re-cert      Subjective Assessment - 09/01/14 1135    Symptoms Straight cane PRN   Patient Stated Goals Get out of pain an get life back.          [facility] PT Assessment - 09/01/14 0001    Assessment   Medical Diagnosis Right totl knee arthroplasty   Onset Date 07/25/14   Next MD Visit 09/14/13   Precautions   Precautions Knee   Precaution Comments No ultrasound.   Balance Screen   Has the patient fallen in the past 6 months No   Has the patient had a decrease in activity level because of a fear of falling?  No   Is the patient reluctant to leave their home because of a fear of falling?  No   Home Environment   Living Enviornment Private residence   Prior Function   Level of Independence Independent with basic ADLs   ROM / Strength   AROM / PROM / Strength AROM;PROM;Strength   AROM   Overall AROM Comments -14 to 105   AROM Assessment Site Knee   PROM   Overall PROM Comments -11 to 110   Strength   Strength Assessment Site Hip;Knee   Right/Left Hip Right   Right Hip Flexion 4+/5   Right/Left Knee Right   Right Knee Extension 4+/5   Palpation   Palpation --  Tender around right kneecap.   Special Tests    Special Tests Swelling Tests   Swelling Tests other2   other   findings --   Side Right;Left   Comments Rt= 43.5 cms vs left= 39 cms  Ambulation/Gait   Ambulation/Gait Yes   Ambulation/Gait Assistance 7: Independent   Assistive device Straight cane   Gait Pattern --  PRN.   Ambulation Surface Level                               PT Long Term Goals - Sep 26, 2014 1215    PT LONG TERM GOAL #1   Title Independent with an advanced HEP.   Time 4   Period Weeks   Status New   PT LONG TERM GOAL #2   Title Full active right knee extension in order to normalize gait.   Time 4   Period Weeks   Status New   PT LONG TERM GOAL #3   Title Active knee flexion to 120-125 degrees+ so the patient can perform functional tasks and do so with pain not > 2-3/10.   Time 4   Period Weeks   Status New   PT LONG TERM GOAL #4   Title Perform a reciprocating stair gait with one railing with pain not > 3/10.   Time 4   Period Weeks   Status New               Plan - 26-Sep-2014 1148    Clinical Impression Statement The patient underwent a right  total knee arthroplasty 07/25/14. She had some HH PT and is compliant with a HEP.  Her current pain-level is a 5-6/10.   Pt will benefit from skilled therapeutic intervention in order to improve on the following deficits Decreased range of motion;Impaired flexibility;Decreased activity tolerance;Pain   Rehab Potential Excellent   PT Frequency 3x / week   PT Duration 4 weeks  or 12 visits.   PT Treatment/Interventions Therapeutic activities;Neuromuscular re-education;Electrical Stimulation;Patient/family education;Passive range of motion   PT Next Visit Plan Right TKA protocol.  Work on extension.   Consulted and Agree with Plan of Care Patient          G-Codes - 09/26/2014 10-23-1217    Functional Assessment Tool Used FOTO   Functional Limitation Mobility: Walking and moving around   Mobility: Walking and Moving Around Current Status [phone]) At least 60 percent but less than 80 percent impaired, limited or restricted   Mobility: Walking and Moving Around Discharge Status [phone]) At least 40 percent but less than 60 percent impaired, limited or restricted       Problem List Patient Active Problem List   Diagnosis Date Noted  . OA (osteoarthritis) of knee 07/25/2014  . Hypothyroid 03/04/2013  . Stroke 01/15/2013  . Abscess or cellulitis, toe 01/11/2013  . Osteoporosis 12/02/2012  . Tinea corporis 11/02/2012  . DM (diabetes mellitus) 10/22/2012  . HTN (hypertension) 10/22/2012  . HLD (hyperlipidemia) 10/22/2012  . Unspecified vitamin D deficiency 09/13/2012  . Obesity, unspecified 09/13/2012    APPLE[place]ATE, [place] MPT 09-26-2014, 12:29 PM  [facility] [address] [place], [place], [address] Phone: [phone]   Fax:  [phone]

## 2014-09-01 NOTE — Patient Instructions (Signed)
Knee Extension Mobilization: Hang (Prone)   With bed supporting thighs, and kneecaps off edge of bed remain up to 10 minutes twice a day..  http://orth.exer.us/723   Copyright  VHI. All rights reserved.    Seated with heel on stool and place 5# weight over thigh above kneecap.  10-20 minutes twice per ay.

## 2014-09-07 ENCOUNTER — Encounter: Payer: Self-pay | Admitting: Physical Therapy

## 2014-09-07 ENCOUNTER — Ambulatory Visit: Payer: Medicare Other | Admitting: Physical Therapy

## 2014-09-07 DIAGNOSIS — M25661 Stiffness of right knee, not elsewhere classified: Secondary | ICD-10-CM

## 2014-09-07 DIAGNOSIS — M25561 Pain in right knee: Secondary | ICD-10-CM | POA: Diagnosis not present

## 2014-09-07 NOTE — Therapy (Signed)
[facility] [address] [place], [place], [address] Phone: [phone]   Fax:  [phone]  Physical Therapy Treatment  Patient Details  Name: Sheila Ray MRN: [id number] Date of Birth: April 04, 1941 Referring Provider:  Bennie Pierini, *  Encounter Date: 09/07/2014      PT End of Session - 09/07/14 1301    Visit Number 2   Number of Visits 12   Date for PT Re-Evaluation 09/29/14   PT Start Time 1228   PT Stop Time 1327   PT Time Calculation (min) 59 min      Past Medical History  Diagnosis Date  . Hypertension   . Goiter   . Obese   . Osteopenia   . Fatigue   . Gastric polyp   . Diverticulosis   . Rosacea   . Postmenopausal   . Vitamin D deficiency   . Hyperlipidemia   . Diabetes mellitus without complication   . 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 Complication of anesthesia   . PONV (postoperative nausea and vomiting)   . Hypothyroid     taken off of thyroid medication 2012014   . Arthritis     Past Surgical History  Procedure Laterality Date  . Abdominal hysterectomy  1985  . Foot surgery Right 08/2002  . Tonsillectomy    . Right knee arthroscopy   2013   . Total knee arthroplasty Right 07/25/2014    Procedure: RIGHT TOTAL KNEE ARTHROPLASTY;  Surgeon: Loanne Drilling, MD;  Location: [facility];  Service: Orthopedics;  Laterality: Right;    There were no vitals taken for this visit.  Visit Diagnosis:  Right knee pain  Knee stiffness, right      Subjective Assessment - 09/07/14 1231    Symptoms very stiff especially with siting to long, pain is 0 at rest and up to 9/10    Currently in Pain? No/denies   Pain Location Knee   Pain Orientation Right   Pain Descriptors / Indicators Other (Comment)   Aggravating Factors  sitting too long /ROM   Pain Relieving Factors rest / movement          [facility] PT Assessment - 09/07/14 0001    ROM / Strength   AROM / PROM / Strength  AROM;PROM   AROM   Overall AROM  Deficits   AROM Assessment Site Knee   Right/Left Knee Right   Right Knee Extension -14   Right Knee Flexion 106   PROM   Overall PROM  Deficits   Overall PROM Comments -10-113 degrees   PROM Assessment Site Knee   Right/Left Knee Right                  [facility] Adult PT Treatment/Exercise - 09/07/14 0001    Exercises   Exercises Knee/Hip   Knee/Hip Exercises: Aerobic   Stationary Bike --  nustep L4 x 10 min   Knee/Hip Exercises: Standing   Forward Step Up Right;10 reps;3 sets  4" step   Rocker Board 3 minutes  stretching   Modalities   Modalities Electrical Stimulation;Cryotherapy   Cryotherapy   Number Minutes Cryotherapy 5 Minutes   Cryotherapy Location Knee   Type of Cryotherapy --  Vasopnumatic (med)   Occupational hygienist Action 1-10Hz    Electrical Stimulation Parameters IFC   Electrical Stimulation Goals Pain   Manual Therapy   Manual Therapy Passive ROM  Passive ROM --  low load holds for flex/ext with low load holds                     PT Long Term Goals - 09/01/14 1215    PT LONG TERM GOAL #1   Title Independent with an advanced HEP.   Time 4   Period Weeks   Status New   PT LONG TERM GOAL #2   Title Full active right knee extension in order to normalize gait.   Time 4   Period Weeks   Status New   PT LONG TERM GOAL #3   Title Active knee flexion to 120-125 degrees+ so the patient can perform functional tasks and do so with pain not > 2-3/10.   Time 4   Period Weeks   Status New   PT LONG TERM GOAL #4   Title Perform a reciprocating stair gait with one railing with pain not > 3/10.   Time 4   Period Weeks   Status New               Plan - 09/07/14 1309    Clinical Impression Statement pt tolerated tx well,  improved ROM and some pain increase with ROM. pt understands self stretching for ROM goal. Goals ongoing.    Pt will benefit from skilled therapeutic intervention in order to improve on the following deficits Decreased range of motion;Impaired flexibility;Decreased activity tolerance;Pain   Rehab Potential Excellent   PT Frequency 3x / week   PT Duration 4 weeks   PT Treatment/Interventions Therapeutic activities;Neuromuscular re-education;Electrical Stimulation;Patient/family education;Passive range of motion   PT Next Visit Plan Right TKA protocol.  Work on extension.   Consulted and Agree with Plan of Care Patient        Problem List Patient Active Problem List   Diagnosis Date Noted  . OA (osteoarthritis) of knee 07/25/2014  . Hypothyroid 03/04/2013  . Stroke 01/15/2013  . Abscess or cellulitis, toe 01/11/2013  . Osteoporosis 12/02/2012  . Tinea corporis 11/02/2012  . DM (diabetes mellitus) 10/22/2012  . HTN (hypertension) 10/22/2012  . HLD (hyperlipidemia) 10/22/2012  . Unspecified vitamin D deficiency 09/13/2012  . Obesity, unspecified 09/13/2012    Jailin Manocchio P, PTA 09/07/2014, 1:30 PM  [facility] [address] [place], [place], [address] Phone: [phone]   Fax:  [phone]

## 2014-09-13 DIAGNOSIS — Z96651 Presence of right artificial knee joint: Secondary | ICD-10-CM | POA: Diagnosis not present

## 2014-09-13 DIAGNOSIS — Z471 Aftercare following joint replacement surgery: Secondary | ICD-10-CM | POA: Diagnosis not present

## 2014-09-20 ENCOUNTER — Ambulatory Visit: Payer: Medicare Other | Admitting: Physical Therapy

## 2014-09-20 ENCOUNTER — Encounter: Payer: Self-pay | Admitting: Physical Therapy

## 2014-09-20 DIAGNOSIS — M25661 Stiffness of right knee, not elsewhere classified: Secondary | ICD-10-CM | POA: Diagnosis not present

## 2014-09-20 DIAGNOSIS — M25561 Pain in right knee: Secondary | ICD-10-CM

## 2014-09-20 NOTE — Therapy (Addendum)
[facility] [address] [place], [place], [address] Phone: [phone]   Fax:  [phone]  Physical Therapy Treatment  Patient Details  Name: Sheila Ray MRN: [id number] Date of Birth: 01/01/1941 Referring Provider:  Bennie Pierini, *  Encounter Date: 09/20/2014      PT End of Session - 09/20/14 0820    Visit Number 3   Number of Visits 12   Date for PT Re-Evaluation 09/29/14   PT Start Time 0813   PT Stop Time 0900   PT Time Calculation (min) 47 min   Activity Tolerance Patient tolerated treatment well   Behavior During Therapy [facility] for tasks assessed/performed      Past Medical History  Diagnosis Date  . Hypertension   . Goiter   . Obese   . Osteopenia   . Fatigue   . Gastric polyp   . Diverticulosis   . Rosacea   . Postmenopausal   . Vitamin D deficiency   . Hyperlipidemia   . Diabetes mellitus without complication   . 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 Complication of anesthesia   . PONV (postoperative nausea and vomiting)   . Hypothyroid     taken off of thyroid medication 2012014   . Arthritis     Past Surgical History  Procedure Laterality Date  . Abdominal hysterectomy  1985  . Foot surgery Right 08/2002  . Tonsillectomy    . Right knee arthroscopy   2013   . Total knee arthroplasty Right 07/25/2014    Procedure: RIGHT TOTAL KNEE ARTHROPLASTY;  Surgeon: Loanne Drilling, MD;  Location: [facility];  Service: Orthopedics;  Laterality: Right;    There were no vitals filed for this visit.  Visit Diagnosis:  Right knee pain  Knee stiffness, right      Subjective Assessment - 09/20/14 0816    Symptoms Patient reports that her R knee feels stiff this morning and that it wasn't painful until she was on stationary bike that was closer to increase R knee flexion. Per patient reports, Dr. Despina Hick told her she is ready for discharge and that her knee is doing well. She is to get an  HEP and then return to Dr. Despina Hick in 5 weeks. She states that her knee may be painful due to how she slept last night.   Patient Stated Goals Get out of pain an get life back.   Currently in Pain? Yes   Pain Score 1    Pain Location Knee   Pain Orientation Right   Pain Descriptors / Indicators Other (Comment)  Stiff   Pain Type Surgical pain   Aggravating Factors  Prolonged sitting   Pain Relieving Factors Rest, movement            [facility] PT Assessment - 09/20/14 0001    Assessment   Medical Diagnosis Right totl knee arthroplasty   Onset Date 07/25/14   Next MD Visit 09/14/13   ROM / Strength   AROM / PROM / Strength AROM;Strength   AROM   Overall AROM  Deficits   Overall AROM Comments 9-122   AROM Assessment Site Knee   Right/Left Knee Right   Right Knee Extension -9   Right Knee Flexion 122   Strength   Overall Strength Deficits   Strength Assessment Site Knee   Right/Left Hip Right   Right Hip Flexion 4-/5   Right/Left Knee Right   Right Knee Flexion 4-/5  Right Knee Extension 4+/5                   [facility] Adult PT Treatment/Exercise - 10-02-14 0001    Knee/Hip Exercises: Aerobic   Stationary Bike Seat L3, x 15 minutes                PT Education - 2014/10/02 0902    Education provided Yes   Education Details HEP- standing hamstring stretch, calf stretch, knee flexion stretch in chair, heel slides, squats, and step ups   Person(s) Educated Patient   Methods Explanation;Demonstration;Handout   Comprehension Verbalized understanding             PT Long Term Goals - 02-Oct-2014 9528    PT LONG TERM GOAL #1   Title Independent with an advanced HEP.   Time 4   Period Weeks   Status Unable to assess  Patient was given advanced HEP today.   PT LONG TERM GOAL #2   Title Full active right knee extension in order to normalize gait.   Time 4   Period Weeks   Status Not Met  Patient's R knee extension -10 degrees   PT LONG TERM GOAL #3    Title Active knee flexion to 120-125 degrees+ so the patient can perform functional tasks and do so with pain not > 2-3/10.   Time 4   Period Weeks   Status Achieved  R knee flexion 122 degrees   PT LONG TERM GOAL #4   Title Perform a reciprocating stair gait with one railing with pain not > 3/10.   Time 4   Period Weeks   Status Not Met  Patient has one step into her front door at her home.               Plan - 10/02/14 4132    Clinical Impression Statement Patient is ready for discharge per Dr. Despina Hick. Patient's R knee flexion has improved to 122 degrees and R knee extension has improved (-9 degrees) as well but still lacking full extension. HEP goal remains unable to assess due to discharge at this time. Patient's knee flexion goal has been met, but all others were not met due to discharge. Patient was agreeable to advanced HEP and instructions. Patient denied pain at the end of the session.    Pt will benefit from skilled therapeutic intervention in order to improve on the following deficits Decreased range of motion;Impaired flexibility;Decreased activity tolerance;Pain   Rehab Potential Excellent   PT Frequency 3x / week   PT Duration 4 weeks   PT Treatment/Interventions Therapeutic activities;Neuromuscular re-education;Electrical Stimulation;Patient/family education;Passive range of motion   PT Next Visit Plan Dr. Despina Hick has given orders for discharge per patient reports and will have a followup with him in 5 weeks.    Consulted and Agree with Plan of Care Patient          G-Codes - 2014/10/02 1023    Functional Assessment Tool Used FOTO   Functional Limitation Mobility: Walking and moving around   Mobility: Walking and Moving Around Current Status [phone]) At least 40 percent but less than 60 percent impaired, limited or restricted   Mobility: Walking and Moving Around Goal Status [phone]) At least 40 percent but less than 60 percent impaired, limited or restricted    Mobility: Walking and Moving Around Discharge Status [phone]) At least 40 percent but less than 60 percent impaired, limited or restricted      Problem List Patient  Active Problem List   Diagnosis Date Noted  . OA (osteoarthritis) of knee 07/25/2014  . Hypothyroid 03/04/2013  . Stroke 01/15/2013  . Abscess or cellulitis, toe 01/11/2013  . Osteoporosis 12/02/2012  . Tinea corporis 11/02/2012  . DM (diabetes mellitus) 10/22/2012  . HTN (hypertension) 10/22/2012  . HLD (hyperlipidemia) 10/22/2012  . Unspecified vitamin D deficiency 09/13/2012  . Obesity, unspecified 09/13/2012    PHYSICAL THERAPY DISCHARGE SUMMARY  Visits from Start of Care: 3  Current functional level related to goals / functional outcomes: Please see above.   Remaining deficits: ROM   Education / Equipment: HEP. Plan: Patient agrees to discharge.  Patient goals were partially met. Patient is being discharged due to a change in medical status.  ?????      Akin Yi, [place] MPT 09/20/2014, 10:25 AM  [facility] [address] [place], [place], [address] Phone: [phone]   Fax:  [phone]

## 2014-09-20 NOTE — Therapy (Signed)
[facility] [place], [place], [address] Phone: [phone]   Fax:  [phone]  Physical Therapy Treatment  Patient Details  Name: Sheila Ray MRN: [id number] Date of Birth: 1940/10/05 Referring Provider:  Chevis Pretty, *  Encounter Date: 09/20/2014      PT End of Session - 09/20/14 0820    Visit Number 3   Number of Visits 12   Date for PT Re-Evaluation 09/29/14   PT Start Time 0813   PT Stop Time 0900   PT Time Calculation (min) 47 min   Activity Tolerance Patient tolerated treatment well   Behavior During Therapy [facility] for tasks assessed/performed      Past Medical History  Diagnosis Date  . Hypertension   . Goiter   . Obese   . Osteopenia   . Fatigue   . Gastric polyp   . Diverticulosis   . Rosacea   . Postmenopausal   . Vitamin D deficiency   . Hyperlipidemia   . Diabetes mellitus without complication   . 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 Complication of anesthesia   . PONV (postoperative nausea and vomiting)   . Hypothyroid     taken off of thyroid medication 2012014   . Arthritis     Past Surgical History  Procedure Laterality Date  . Abdominal hysterectomy  1985  . Foot surgery Right 08/2002  . Tonsillectomy    . Right knee arthroscopy   2013   . 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  Visit Diagnosis:  Right knee pain  Knee stiffness, right      Subjective Assessment - 09/20/14 0816    Symptoms Patient reports that her R knee feels stiff this morning and that it wasn't painful until she was on stationary bike that was closer to increase R knee flexion. Per patient reports, Dr. Maureen Ralphs told her she is ready for discharge and that her knee is doing well. She is to get an  HEP and then return to Dr. Maureen Ralphs in 5 weeks. She states that her knee may be painful due to how she slept last night.   Patient Stated Goals Get out of pain an get life back.   Currently in Pain? Yes   Pain Score 1    Pain Location Knee   Pain Orientation Right   Pain Descriptors / Indicators Other (Comment)  Stiff   Pain Type Surgical pain   Aggravating Factors  Prolonged sitting   Pain Relieving Factors Rest, movement            [facility] PT Assessment - 09/20/14 0001    Assessment   Medical Diagnosis Right totl knee arthroplasty   Onset Date 07/25/14   Next MD Visit 09/14/13   ROM / Strength   AROM / PROM / Strength AROM;Strength   AROM   Overall AROM  Deficits   Overall AROM Comments 9-122   AROM Assessment Site Knee   Right/Left Knee Right   Right Knee Extension -9   Right Knee Flexion 122   Strength   Overall Strength Deficits   Strength Assessment Site Knee   Right/Left Hip Right   Right Hip Flexion 4-/5   Right/Left Knee Right   Right Knee Flexion 4-/5  Right Knee Extension 4+/5                   [facility] Adult PT Treatment/Exercise - 09/20/14 0001    Knee/Hip Exercises: Aerobic   Stationary Bike Seat L3, x 15 minutes                PT Education - 09/20/14 0902    Education provided Yes   Education Details HEP- standing hamstring stretch, calf stretch, knee flexion stretch in chair, heel slides, squats, and step ups   Person(s) Educated Patient   Methods Explanation;Demonstration;Handout   Comprehension Verbalized understanding             PT Long Term Goals - 09/20/14 4888    PT LONG TERM GOAL #1   Title Independent with an advanced HEP.   Time 4   Period Weeks   Status Unable to assess  Patient was given advanced HEP today.   PT LONG TERM GOAL #2   Title Full active right knee extension in order to normalize gait.   Time 4   Period Weeks   Status Not Met  Patient's R knee extension -10 degrees   PT LONG TERM GOAL #3    Title Active knee flexion to 120-125 degrees+ so the patient can perform functional tasks and do so with pain not > 2-3/10.   Time 4   Period Weeks   Status Achieved  R knee flexion 122 degrees   PT LONG TERM GOAL #4   Title Perform a reciprocating stair gait with one railing with pain not > 3/10.   Time 4   Period Weeks   Status Not Met  Patient has one step into her front door at her home.               Plan - 09/20/14 9169    Clinical Impression Statement Patient is ready for discharge per Dr. Maureen Ralphs. Patient's R knee flexion has improved to 122 degrees and R knee extension has improved (-9 degrees) as well but still lacking full extension. HEP goal remains unable to assess due to discharge at this time. Patient's knee flexion goal has been met, but all others were not met due to discharge. Patient was agreeable to advanced HEP and instructions. Patient denied pain at the end of the session.    Pt will benefit from skilled therapeutic intervention in order to improve on the following deficits Decreased range of motion;Impaired flexibility;Decreased activity tolerance;Pain   Rehab Potential Excellent   PT Frequency 3x / week   PT Duration 4 weeks   PT Treatment/Interventions Therapeutic activities;Neuromuscular re-education;Electrical Stimulation;Patient/family education;Passive range of motion   PT Next Visit Plan Dr. Maureen Ralphs has given orders for discharge per patient reports and will have a followup with him in 5 weeks.    Consulted and Agree with Plan of Care Patient        Problem List Patient Active Problem List   Diagnosis Date Noted  . OA (osteoarthritis) of knee 07/25/2014  . Hypothyroid 03/04/2013  . Stroke 01/15/2013  . Abscess or cellulitis, toe 01/11/2013  . Osteoporosis 12/02/2012  . Tinea corporis 11/02/2012  . DM (diabetes mellitus) 10/22/2012  . HTN (hypertension) 10/22/2012  . HLD (hyperlipidemia) 10/22/2012  . Unspecified vitamin D deficiency  09/13/2012  . Obesity, unspecified 09/13/2012    Wynelle Fanny, PTA 09/20/2014, 9:32 AM  [facility] [address] [place], [place], [address] Phone: [phone]   Fax:  [phone]

## 2014-09-22 ENCOUNTER — Encounter: Payer: Medicare Other | Admitting: Physical Therapy

## 2014-09-22 DIAGNOSIS — L570 Actinic keratosis: Secondary | ICD-10-CM | POA: Diagnosis not present

## 2014-09-22 DIAGNOSIS — I781 Nevus, non-neoplastic: Secondary | ICD-10-CM | POA: Diagnosis not present

## 2014-09-22 DIAGNOSIS — L814 Other melanin hyperpigmentation: Secondary | ICD-10-CM | POA: Diagnosis not present

## 2014-11-22 ENCOUNTER — Other Ambulatory Visit: Payer: Self-pay | Admitting: Family Medicine

## 2014-11-22 DIAGNOSIS — Z1231 Encounter for screening mammogram for malignant neoplasm of breast: Secondary | ICD-10-CM

## 2014-11-30 ENCOUNTER — Ambulatory Visit: Payer: Medicare Other

## 2014-12-06 ENCOUNTER — Ambulatory Visit (INDEPENDENT_AMBULATORY_CARE_PROVIDER_SITE_OTHER): Payer: Medicare Other

## 2014-12-06 ENCOUNTER — Ambulatory Visit (INDEPENDENT_AMBULATORY_CARE_PROVIDER_SITE_OTHER): Payer: Medicare Other | Admitting: *Deleted

## 2014-12-06 ENCOUNTER — Encounter: Payer: Self-pay | Admitting: *Deleted

## 2014-12-06 VITALS — BP 162/85 | HR 65 | Ht 64.5 in | Wt 175.0 lb

## 2014-12-06 DIAGNOSIS — Z0000 Encounter for general adult medical examination without abnormal findings: Secondary | ICD-10-CM

## 2014-12-06 DIAGNOSIS — Z780 Asymptomatic menopausal state: Secondary | ICD-10-CM

## 2014-12-06 DIAGNOSIS — Z23 Encounter for immunization: Secondary | ICD-10-CM | POA: Diagnosis not present

## 2014-12-06 NOTE — Patient Instructions (Addendum)
DASH diet to help with blood pressure Schedule eye exam with Dr Katy Fitch. Have him send a copy of the report to our office.  Move carefully to avoid falls.  Mammogram on 01/25/15 at 9:45 at the mobile unit here at [place]. Avoid very ripe fruits because they are higher in sugar.  Try to walk for at least 30 minutes 3 times per week.  Continue to stay active and involved in community activities.  Keep appt with [name], FNP Return FOBT Review Advance Directives and bring a signed copy to our office Kegel exercises to strengthen pelvic floor muscles and decrease incontinence  DASH Eating Plan DASH stands for "Dietary Approaches to Stop Hypertension." The DASH eating plan is a healthy eating plan that has been shown to reduce high blood pressure (hypertension). Additional health benefits may include reducing the risk of type 2 diabetes mellitus, heart disease, and stroke. The DASH eating plan may also help with weight loss. WHAT DO I NEED TO KNOW ABOUT THE DASH EATING PLAN? For the DASH eating plan, you will follow these general guidelines:  Choose foods with a percent daily value for sodium of less than 5% (as listed on the food label).  Use salt-free seasonings or herbs instead of table salt or sea salt.  Check with your health care provider or pharmacist before using salt substitutes.  Eat lower-sodium products, often labeled as "lower sodium" or "no salt added."  Eat fresh foods.  Eat more vegetables, fruits, and low-fat dairy products.  Choose whole grains. Look for the word "whole" as the first word in the ingredient list.  Choose fish and skinless chicken or [place] more often than red meat. Limit fish, poultry, and meat to 6 oz (170 g) each day.  Limit sweets, desserts, sugars, and sugary drinks.  Choose heart-healthy fats.  Limit cheese to 1 oz (28 g) per day.  Eat more home-cooked food and less restaurant, buffet, and fast food.  Limit fried  foods.  Cook foods using methods other than frying.  Limit canned vegetables. If you do use them, rinse them well to decrease the sodium.  When eating at a restaurant, ask that your food be prepared with less salt, or no salt if possible. WHAT FOODS CAN I EAT? Seek help from a dietitian for individual calorie needs. Grains Whole grain or whole wheat bread. Brown rice. Whole grain or whole wheat pasta. Quinoa, bulgur, and whole grain cereals. Low-sodium cereals. Corn or whole wheat flour tortillas. Whole grain cornbread. Whole grain crackers. Low-sodium crackers. Vegetables Fresh or frozen vegetables (raw, steamed, roasted, or grilled). Low-sodium or reduced-sodium tomato and vegetable juices. Low-sodium or reduced-sodium tomato sauce and paste. Low-sodium or reduced-sodium canned vegetables.  Fruits All fresh, canned (in natural juice), or frozen fruits. Meat and Other Protein Products Ground beef (85% or leaner), grass-fed beef, or beef trimmed of fat. Skinless chicken or [place]. Ground chicken or [place]. Pork trimmed of fat. All fish and seafood. Eggs. Dried beans, peas, or lentils. Unsalted nuts and seeds. Unsalted canned beans. Dairy Low-fat dairy products, such as skim or 1% milk, 2% or reduced-fat cheeses, low-fat ricotta or cottage cheese, or plain low-fat yogurt. Low-sodium or reduced-sodium cheeses. Fats and Oils Tub margarines without trans fats. Light or reduced-fat mayonnaise and salad dressings (reduced sodium). Avocado. Safflower, olive, or canola oils. Natural peanut or almond butter. Other Unsalted popcorn and pretzels. The items listed above may not be a complete list of recommended foods or beverages. Contact your  dietitian for more options. WHAT FOODS ARE NOT RECOMMENDED? Grains White bread. White pasta. White rice. Refined cornbread. Bagels and croissants. Crackers that contain trans fat. Vegetables Creamed or fried vegetables. Vegetables in a cheese sauce. Regular  canned vegetables. Regular canned tomato sauce and paste. Regular tomato and vegetable juices. Fruits Dried fruits. Canned fruit in light or heavy syrup. Fruit juice. Meat and Other Protein Products Fatty cuts of meat. Ribs, chicken wings, bacon, sausage, bologna, salami, chitterlings, fatback, hot dogs, bratwurst, and packaged luncheon meats. Salted nuts and seeds. Canned beans with salt. Dairy Whole or 2% milk, cream, half-and-half, and cream cheese. Whole-fat or sweetened yogurt. Full-fat cheeses or blue cheese. Nondairy creamers and whipped toppings. Processed cheese, cheese spreads, or cheese curds. Condiments Onion and garlic salt, seasoned salt, table salt, and sea salt. Canned and packaged gravies. Worcestershire sauce. Tartar sauce. Barbecue sauce. Teriyaki sauce. Soy sauce, including reduced sodium. Steak sauce. Fish sauce. Oyster sauce. Cocktail sauce. Horseradish. Ketchup and mustard. Meat flavorings and tenderizers. Bouillon cubes. Hot sauce. Tabasco sauce. Marinades. Taco seasonings. Relishes. Fats and Oils Butter, stick margarine, lard, shortening, ghee, and bacon fat. Coconut, palm kernel, or palm oils. Regular salad dressings. Other Pickles and olives. Salted popcorn and pretzels. The items listed above may not be a complete list of foods and beverages to avoid. Contact your dietitian for more information. WHERE CAN I FIND MORE INFORMATION? National Heart, Lung, and Blood Institute: travelstabloid.com Document Released: 06/06/2011 Document Revised: 11/01/2013 Document Reviewed: 04/21/2013 [facility] Patient Information 2015 [place], [place]. This information is not intended to replace advice given to you by your health care provider. Make sure you discuss any questions you have with your health care provider.   Health Maintenance Adopting a healthy lifestyle and getting preventive care can go a long way to promote health and wellness. Talk with your  health care provider about what schedule of regular examinations is right for you. This is a good chance for you to check in with your provider about disease prevention and staying healthy. In between checkups, there are plenty of things you can do on your own. Experts have done a lot of research about which lifestyle changes and preventive measures are most likely to keep you healthy. Ask your health care provider for more information. WEIGHT AND DIET  Eat a healthy diet  Be sure to include plenty of vegetables, fruits, low-fat dairy products, and lean protein.  Do not eat a lot of foods high in solid fats, added sugars, or salt.  Get regular exercise. This is one of the most important things you can do for your health.  Most adults should exercise for at least 150 minutes each week. The exercise should increase your heart rate and make you sweat (moderate-intensity exercise).  Most adults should also do strengthening exercises at least twice a week. This is in addition to the moderate-intensity exercise.  Maintain a healthy weight  Body mass index (BMI) is a measurement that can be used to identify possible weight problems. It estimates body fat based on height and weight. Your health care provider can help determine your BMI and help you achieve or maintain a healthy weight.  For females 2 years of age and older:   A BMI below 18.5 is considered underweight.  A BMI of 18.5 to 24.9 is normal.  A BMI of 25 to 29.9 is considered overweight.  A BMI of 30 and above is considered obese.  Watch levels of cholesterol and blood lipids  You  should start having your blood tested for lipids and cholesterol at 74 years of age, then have this test every 5 years.  You may need to have your cholesterol levels checked more often if:  Your lipid or cholesterol levels are high.  You are older than 74 years of age.  You are at high risk for heart disease.  CANCER SCREENING   Lung  Cancer  Lung cancer screening is recommended for adults 74-64 years old who are at high risk for lung cancer because of a history of smoking.  A yearly low-dose CT scan of the lungs is recommended for people who:  Currently smoke.  Have quit within the past 15 years.  Have at least a 30-pack-year history of smoking. A pack year is smoking an average of one pack of cigarettes a day for 1 year.  Yearly screening should continue until it has been 15 years since you quit.  Yearly screening should stop if you develop a health problem that would prevent you from having lung cancer treatment.  Breast Cancer  Practice breast self-awareness. This means understanding how your breasts normally appear and feel.  It also means doing regular breast self-exams. Let your health care provider know about any changes, no matter how small.  If you are in your 20s or 30s, you should have a clinical breast exam (CBE) by a health care provider every 1-3 years as part of a regular health exam.  If you are 30 or older, have a CBE every year. Also consider having a breast X-ray (mammogram) every year.  If you have a family history of breast cancer, talk to your health care provider about genetic screening.  If you are at high risk for breast cancer, talk to your health care provider about having an MRI and a mammogram every year.  Breast cancer gene (BRCA) assessment is recommended for women who have family members with BRCA-related cancers. BRCA-related cancers include:  Breast.  Ovarian.  Tubal.  Peritoneal cancers.  Results of the assessment will determine the need for genetic counseling and BRCA1 and BRCA2 testing. Cervical Cancer Routine pelvic examinations to screen for cervical cancer are no longer recommended for nonpregnant women who are considered low risk for cancer of the pelvic organs (ovaries, uterus, and vagina) and who do not have symptoms. A pelvic examination may be necessary if you  have symptoms including those associated with pelvic infections. Ask your health care provider if a screening pelvic exam is right for you.   The Pap test is the screening test for cervical cancer for women who are considered at risk.  If you had a hysterectomy for a problem that was not cancer or a condition that could lead to cancer, then you no longer need Pap tests.  If you are older than 65 years, and you have had normal Pap tests for the past 10 years, you no longer need to have Pap tests.  If you have had past treatment for cervical cancer or a condition that could lead to cancer, you need Pap tests and screening for cancer for at least 20 years after your treatment.  If you no longer get a Pap test, assess your risk factors if they change (such as having a new sexual partner). This can affect whether you should start being screened again.  Some women have medical problems that increase their chance of getting cervical cancer. If this is the case for you, your health care provider may recommend more frequent  screening and Pap tests.  The human papillomavirus (HPV) test is another test that may be used for cervical cancer screening. The HPV test looks for the virus that can cause cell changes in the cervix. The cells collected during the Pap test can be tested for HPV.  The HPV test can be used to screen women 55 years of age and older. Getting tested for HPV can extend the interval between normal Pap tests from three to five years.  An HPV test also should be used to screen women of any age who have unclear Pap test results.  After 74 years of age, women should have HPV testing as often as Pap tests.  Colorectal Cancer  This type of cancer can be detected and often prevented.  Routine colorectal cancer screening usually begins at 74 years of age and continues through 74 years of age.  Your health care provider may recommend screening at an earlier age if you have risk factors for  colon cancer.  Your health care provider may also recommend using home test kits to check for hidden blood in the stool.  A small camera at the end of a tube can be used to examine your colon directly (sigmoidoscopy or colonoscopy). This is done to check for the earliest forms of colorectal cancer.  Routine screening usually begins at age 19.  Direct examination of the colon should be repeated every 5-10 years through 74 years of age. However, you may need to be screened more often if early forms of precancerous polyps or small growths are found. Skin Cancer  Check your skin from head to toe regularly.  Tell your health care provider about any new moles or changes in moles, especially if there is a change in a mole's shape or color.  Also tell your health care provider if you have a mole that is larger than the size of a pencil eraser.  Always use sunscreen. Apply sunscreen liberally and repeatedly throughout the day.  Protect yourself by wearing long sleeves, pants, a wide-brimmed hat, and sunglasses whenever you are outside. HEART DISEASE, DIABETES, AND HIGH BLOOD PRESSURE   Have your blood pressure checked at least every 1-2 years. High blood pressure causes heart disease and increases the risk of stroke.  If you are between 7 years and 50 years old, ask your health care provider if you should take aspirin to prevent strokes.  Have regular diabetes screenings. This involves taking a blood sample to check your fasting blood sugar level.  If you are at a normal weight and have a low risk for diabetes, have this test once every three years after 74 years of age.  If you are overweight and have a high risk for diabetes, consider being tested at a younger age or more often. PREVENTING INFECTION  Hepatitis B  If you have a higher risk for hepatitis B, you should be screened for this virus. You are considered at high risk for hepatitis B if:  You were born in a country where  hepatitis B is common. Ask your health care provider which countries are considered high risk.  Your parents were born in a high-risk country, and you have not been immunized against hepatitis B (hepatitis B vaccine).  You have HIV or AIDS.  You use needles to inject street drugs.  You live with someone who has hepatitis B.  You have had sex with someone who has hepatitis B.  You get hemodialysis treatment.  You take certain medicines  for conditions, including cancer, organ transplantation, and autoimmune conditions. Hepatitis C  Blood testing is recommended for:  Everyone born from [birth year] through 1965.  Anyone with known risk factors for hepatitis C. Sexually transmitted infections (STIs)  You should be screened for sexually transmitted infections (STIs) including gonorrhea and chlamydia if:  You are sexually active and are younger than 74 years of age.  You are older than 74 years of age and your health care provider tells you that you are at risk for this type of infection.  Your sexual activity has changed since you were last screened and you are at an increased risk for chlamydia or gonorrhea. Ask your health care provider if you are at risk.  If you do not have HIV, but are at risk, it may be recommended that you take a prescription medicine daily to prevent HIV infection. This is called pre-exposure prophylaxis (PrEP). You are considered at risk if:  You are sexually active and do not regularly use condoms or know the HIV status of your partner(s).  You take drugs by injection.  You are sexually active with a partner who has HIV. Talk with your health care provider about whether you are at high risk of being infected with HIV. If you choose to begin PrEP, you should first be tested for HIV. You should then be tested every 3 months for as long as you are taking PrEP.  PREGNANCY   If you are premenopausal and you may become pregnant, ask your health care provider about  preconception counseling.  If you may become pregnant, take 400 to 800 micrograms (mcg) of folic acid every day.  If you want to prevent pregnancy, talk to your health care provider about birth control (contraception). OSTEOPOROSIS AND MENOPAUSE   Osteoporosis is a disease in which the bones lose minerals and strength with aging. This can result in serious bone fractures. Your risk for osteoporosis can be identified using a bone density scan.  If you are 12 years of age or older, or if you are at risk for osteoporosis and fractures, ask your health care provider if you should be screened.  Ask your health care provider whether you should take a calcium or vitamin D supplement to lower your risk for osteoporosis.  Menopause may have certain physical symptoms and risks.  Hormone replacement therapy may reduce some of these symptoms and risks. Talk to your health care provider about whether hormone replacement therapy is right for you.  HOME CARE INSTRUCTIONS   Schedule regular health, dental, and eye exams.  Stay current with your immunizations.   Do not use any tobacco products including cigarettes, chewing tobacco, or electronic cigarettes.  If you are pregnant, do not drink alcohol.  If you are breastfeeding, limit how much and how often you drink alcohol.  Limit alcohol intake to no more than 1 drink per day for nonpregnant women. One drink equals 12 ounces of beer, 5 ounces of wine, or 1 ounces of hard liquor.  Do not use street drugs.  Do not share needles.  Ask your health care provider for help if you need support or information about quitting drugs.  Tell your health care provider if you often feel depressed.  Tell your health care provider if you have ever been abused or do not feel safe at home. Document Released: 12/31/2010 Document Revised: 11/01/2013 Document Reviewed: 05/19/2013 [facility] Patient Information 2015 [place], [place]. This information is not intended  to replace advice given to you  by your health care provider. Make sure you discuss any questions you have with your health care provider.  Health Maintenance Adopting a healthy lifestyle and getting preventive care can go a long way to promote health and wellness. Talk with your health care provider about what schedule of regular examinations is right for you. This is a good chance for you to check in with your provider about disease prevention and staying healthy. In between checkups, there are plenty of things you can do on your own. Experts have done a lot of research about which lifestyle changes and preventive measures are most likely to keep you healthy. Ask your health care provider for more information. WEIGHT AND DIET  Eat a healthy diet  Be sure to include plenty of vegetables, fruits, low-fat dairy products, and lean protein.  Do not eat a lot of foods high in solid fats, added sugars, or salt.  Get regular exercise. This is one of the most important things you can do for your health.  Most adults should exercise for at least 150 minutes each week. The exercise should increase your heart rate and make you sweat (moderate-intensity exercise).  Most adults should also do strengthening exercises at least twice a week. This is in addition to the moderate-intensity exercise.  Maintain a healthy weight  Body mass index (BMI) is a measurement that can be used to identify possible weight problems. It estimates body fat based on height and weight. Your health care provider can help determine your BMI and help you achieve or maintain a healthy weight.  For females 48 years of age and older:   A BMI below 18.5 is considered underweight.  A BMI of 18.5 to 24.9 is normal.  A BMI of 25 to 29.9 is considered overweight.  A BMI of 30 and above is considered obese.  Watch levels of cholesterol and blood lipids  You should start having your blood tested for lipids and cholesterol at 74  years of age, then have this test every 5 years.  You may need to have your cholesterol levels checked more often if:  Your lipid or cholesterol levels are high.  You are older than 74 years of age.  You are at high risk for heart disease.  CANCER SCREENING   Lung Cancer  Lung cancer screening is recommended for adults 49-59 years old who are at high risk for lung cancer because of a history of smoking.  A yearly low-dose CT scan of the lungs is recommended for people who:  Currently smoke.  Have quit within the past 15 years.  Have at least a 30-pack-year history of smoking. A pack year is smoking an average of one pack of cigarettes a day for 1 year.  Yearly screening should continue until it has been 15 years since you quit.  Yearly screening should stop if you develop a health problem that would prevent you from having lung cancer treatment.  Breast Cancer  Practice breast self-awareness. This means understanding how your breasts normally appear and feel.  It also means doing regular breast self-exams. Let your health care provider know about any changes, no matter how small.  If you are in your 20s or 30s, you should have a clinical breast exam (CBE) by a health care provider every 1-3 years as part of a regular health exam.  If you are 65 or older, have a CBE every year. Also consider having a breast X-ray (mammogram) every year.  If you have  a family history of breast cancer, talk to your health care provider about genetic screening.  If you are at high risk for breast cancer, talk to your health care provider about having an MRI and a mammogram every year.  Breast cancer gene (BRCA) assessment is recommended for women who have family members with BRCA-related cancers. BRCA-related cancers include:  Breast.  Ovarian.  Tubal.  Peritoneal cancers.  Results of the assessment will determine the need for genetic counseling and BRCA1 and BRCA2 testing. Cervical  Cancer Routine pelvic examinations to screen for cervical cancer are no longer recommended for nonpregnant women who are considered low risk for cancer of the pelvic organs (ovaries, uterus, and vagina) and who do not have symptoms. A pelvic examination may be necessary if you have symptoms including those associated with pelvic infections. Ask your health care provider if a screening pelvic exam is right for you.   The Pap test is the screening test for cervical cancer for women who are considered at risk.  If you had a hysterectomy for a problem that was not cancer or a condition that could lead to cancer, then you no longer need Pap tests.  If you are older than 65 years, and you have had normal Pap tests for the past 10 years, you no longer need to have Pap tests.  If you have had past treatment for cervical cancer or a condition that could lead to cancer, you need Pap tests and screening for cancer for at least 20 years after your treatment.  If you no longer get a Pap test, assess your risk factors if they change (such as having a new sexual partner). This can affect whether you should start being screened again.  Some women have medical problems that increase their chance of getting cervical cancer. If this is the case for you, your health care provider may recommend more frequent screening and Pap tests.  The human papillomavirus (HPV) test is another test that may be used for cervical cancer screening. The HPV test looks for the virus that can cause cell changes in the cervix. The cells collected during the Pap test can be tested for HPV.  The HPV test can be used to screen women 35 years of age and older. Getting tested for HPV can extend the interval between normal Pap tests from three to five years.  An HPV test also should be used to screen women of any age who have unclear Pap test results.  After 74 years of age, women should have HPV testing as often as Pap tests.  Colorectal  Cancer  This type of cancer can be detected and often prevented.  Routine colorectal cancer screening usually begins at 74 years of age and continues through 74 years of age.  Your health care provider may recommend screening at an earlier age if you have risk factors for colon cancer.  Your health care provider may also recommend using home test kits to check for hidden blood in the stool.  A small camera at the end of a tube can be used to examine your colon directly (sigmoidoscopy or colonoscopy). This is done to check for the earliest forms of colorectal cancer.  Routine screening usually begins at age 62.  Direct examination of the colon should be repeated every 5-10 years through 74 years of age. However, you may need to be screened more often if early forms of precancerous polyps or small growths are found. Skin Cancer  Check your  skin from head to toe regularly.  Tell your health care provider about any new moles or changes in moles, especially if there is a change in a mole's shape or color.  Also tell your health care provider if you have a mole that is larger than the size of a pencil eraser.  Always use sunscreen. Apply sunscreen liberally and repeatedly throughout the day.  Protect yourself by wearing long sleeves, pants, a wide-brimmed hat, and sunglasses whenever you are outside. HEART DISEASE, DIABETES, AND HIGH BLOOD PRESSURE   Have your blood pressure checked at least every 1-2 years. High blood pressure causes heart disease and increases the risk of stroke.  If you are between 68 years and 25 years old, ask your health care provider if you should take aspirin to prevent strokes.  Have regular diabetes screenings. This involves taking a blood sample to check your fasting blood sugar level.  If you are at a normal weight and have a low risk for diabetes, have this test once every three years after 74 years of age.  If you are overweight and have a high risk for  diabetes, consider being tested at a younger age or more often. PREVENTING INFECTION  Hepatitis B  If you have a higher risk for hepatitis B, you should be screened for this virus. You are considered at high risk for hepatitis B if:  You were born in a country where hepatitis B is common. Ask your health care provider which countries are considered high risk.  Your parents were born in a high-risk country, and you have not been immunized against hepatitis B (hepatitis B vaccine).  You have HIV or AIDS.  You use needles to inject street drugs.  You live with someone who has hepatitis B.  You have had sex with someone who has hepatitis B.  You get hemodialysis treatment.  You take certain medicines for conditions, including cancer, organ transplantation, and autoimmune conditions. Hepatitis C  Blood testing is recommended for:  Everyone born from [birth year] through 1965.  Anyone with known risk factors for hepatitis C. Sexually transmitted infections (STIs)  You should be screened for sexually transmitted infections (STIs) including gonorrhea and chlamydia if:  You are sexually active and are younger than 74 years of age.  You are older than 74 years of age and your health care provider tells you that you are at risk for this type of infection.  Your sexual activity has changed since you were last screened and you are at an increased risk for chlamydia or gonorrhea. Ask your health care provider if you are at risk.  If you do not have HIV, but are at risk, it may be recommended that you take a prescription medicine daily to prevent HIV infection. This is called pre-exposure prophylaxis (PrEP). You are considered at risk if:  You are sexually active and do not regularly use condoms or know the HIV status of your partner(s).  You take drugs by injection.  You are sexually active with a partner who has HIV. Talk with your health care provider about whether you are at high risk of  being infected with HIV. If you choose to begin PrEP, you should first be tested for HIV. You should then be tested every 3 months for as long as you are taking PrEP.  PREGNANCY   If you are premenopausal and you may become pregnant, ask your health care provider about preconception counseling.  If you may become pregnant, take 400  to 800 micrograms (mcg) of folic acid every day.  If you want to prevent pregnancy, talk to your health care provider about birth control (contraception). OSTEOPOROSIS AND MENOPAUSE   Osteoporosis is a disease in which the bones lose minerals and strength with aging. This can result in serious bone fractures. Your risk for osteoporosis can be identified using a bone density scan.  If you are 16 years of age or older, or if you are at risk for osteoporosis and fractures, ask your health care provider if you should be screened.  Ask your health care provider whether you should take a calcium or vitamin D supplement to lower your risk for osteoporosis.  Menopause may have certain physical symptoms and risks.  Hormone replacement therapy may reduce some of these symptoms and risks. Talk to your health care provider about whether hormone replacement therapy is right for you.  HOME CARE INSTRUCTIONS   Schedule regular health, dental, and eye exams.  Stay current with your immunizations.   Do not use any tobacco products including cigarettes, chewing tobacco, or electronic cigarettes.  If you are pregnant, do not drink alcohol.  If you are breastfeeding, limit how much and how often you drink alcohol.  Limit alcohol intake to no more than 1 drink per day for nonpregnant women. One drink equals 12 ounces of beer, 5 ounces of wine, or 1 ounces of hard liquor.  Do not use street drugs.  Do not share needles.  Ask your health care provider for help if you need support or information about quitting drugs.  Tell your health care provider if you often feel  depressed.  Tell your health care provider if you have ever been abused or do not feel safe at home. Document Released: 12/31/2010 Document Revised: 11/01/2013 Document Reviewed: 05/19/2013 [facility] Patient Information 2015 [place], [place]. This information is not intended to replace advice given to you by your health care provider. Make sure you discuss any questions you have with your health care provider.   Pneumococcal Vaccine, Polyvalent suspension for injection What is this medicine? PNEUMOCOCCAL VACCINE, POLYVALENT (NEU mo KOK al vak SEEN, pol ee VEY luhnt) is a vaccine to prevent pneumococcus bacteria infection. These bacteria are a major cause of ear infections, 'Strep throat' infections, and serious pneumonia, meningitis, or blood infections worldwide. These vaccines help the body to produce antibodies (protective substances) that help your body defend against these bacteria. This vaccine is recommended for infants and young children. This vaccine will not treat an infection. This medicine may be used for other purposes; ask your health care provider or pharmacist if you have questions. COMMON BRAND NAME(S): Prevnar 13 What should I tell my health care provider before I take this medicine? They need to know if you have any of these conditions: -bleeding problems -fever -immune system problems -low platelet count in the blood -seizures -an unusual or allergic reaction to pneumococcal vaccine, diphtheria toxoid, other vaccines, latex, other medicines, foods, dyes, or preservatives -pregnant or trying to get pregnant -breast-feeding How should I use this medicine? This vaccine is for injection into a muscle. It is given by a health care professional. A copy of Vaccine Information Statements will be given before each vaccination. Read this sheet carefully each time. The sheet may change frequently. Talk to your pediatrician regarding the use of this medicine in children. While this  drug may be prescribed for children as young as 5 weeks old for selected conditions, precautions do apply. Overdosage: If you think  you have taken too much of this medicine contact a poison control center or emergency room at once. NOTE: This medicine is only for you. Do not share this medicine with others. What if I miss a dose? It is important not to miss your dose. Call your doctor or health care professional if you are unable to keep an appointment. What may interact with this medicine? -medicines for cancer chemotherapy -medicines that suppress your immune function -medicines that treat or prevent blood clots like warfarin, enoxaparin, and dalteparin -steroid medicines like prednisone or cortisone This list may not describe all possible interactions. Give your health care provider a list of all the medicines, herbs, non-prescription drugs, or dietary supplements you use. Also tell them if you smoke, drink alcohol, or use illegal drugs. Some items may interact with your medicine. What should I watch for while using this medicine? Mild fever and pain should go away in 3 days or less. Report any unusual symptoms to your doctor or health care professional. What side effects may I notice from receiving this medicine? Side effects that you should report to your doctor or health care professional as soon as possible: -allergic reactions like skin rash, itching or hives, swelling of the face, lips, or tongue -breathing problems -confused -fever over 102 degrees F -pain, tingling, numbness in the hands or feet -seizures -unusual bleeding or bruising -unusual muscle weakness Side effects that usually do not require medical attention (report to your doctor or health care professional if they continue or are bothersome): -aches and pains -diarrhea -fever of 102 degrees F or less -headache -irritable -loss of appetite -pain, tender at site where injected -trouble sleeping This list may not  describe all possible side effects. Call your doctor for medical advice about side effects. You may report side effects to FDA at 1-800-FDA-1088. Where should I keep my medicine? This does not apply. This vaccine is given in a clinic, pharmacy, doctor's office, or other health care setting and will not be stored at home. NOTE: This sheet is a summary. It may not cover all possible information. If you have questions about this medicine, talk to your doctor, pharmacist, or health care provider.  2015, Elsevier/Gold Standard. (2008-08-30 10:17:22)

## 2014-12-06 NOTE — Progress Notes (Signed)
Subjective:   Sheila Ray is a 74 y.o. female who presents for an Initial Medicare Annual Wellness Visit. Sheila Ray is divorced and lives in a town home. She has one adult son and one grandson. 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 Sheila Ray is active in her church and community. She participates in the local recreation department senior program and they will be taking a train trip to [place] at the end of the month. She is also an avid reader.   Review of Systems    Urinary-Mild urge incontinence if she waits too long to go to the restroom. She does not have to wear depends and doesn't think it's a problem.   Cardiac Risk Factors include: advanced age (>5men, >81 women);diabetes mellitus;dyslipidemia;hypertension;obesity (BMI >30kg/m2)   Musculoskeletal: continued mild right knee pain and stiffness s/p total knee arthroscopy in 07/2014. She continues to do leg exercises to help with this.   Other systems negative.      Objective:    Today's Vitals   12/06/14 0957  BP: 162/85  Pulse: 65  Height: 5' 4.5" (1.638 m)  Weight: 175 lb (79.379 kg)    Current Medications (verified) Outpatient Encounter Prescriptions as of 12/06/2014  Medication Sig  . amLODipine (NORVASC) 5 MG tablet Take 1 tablet (5 mg total) by mouth daily.  Marland Kitchen lisinopril (PRINIVIL,ZESTRIL) 40 MG tablet Take 40 mg by mouth daily.   . metFORMIN (GLUCOPHAGE) 1000 MG tablet TAKE ONE TABLET BY MOUTH with breaksfast  . ONE TOUCH ULTRA TEST test strip USE TO CHECK BLOOD GLUCOSE ONCE A DAY AS INSTRUCTED  . ONETOUCH DELICA LANCETS 73S MISC 1 each by Does not apply route daily. Use to check BG daily Dx:  250.02  . triamcinolone cream (KENALOG) 0.1 % Apply 1 application topically 2 (two) times daily as needed (Apply under breasts when needed for irritation).  . [DISCONTINUED] methocarbamol (ROBAXIN) 500 MG tablet Take 1 tablet (500 mg total) by mouth every 6 (six) hours as needed for muscle  spasms.  . [DISCONTINUED] oxyCODONE (OXY IR/ROXICODONE) 5 MG immediate release tablet Take 1-2 tablets (5-10 mg total) by mouth every 3 (three) hours as needed for moderate pain, severe pain or breakthrough pain.  . [DISCONTINUED] PRESCRIPTION MEDICATION Apply 1 application topically 2 (two) times daily as needed (Applied under breasts for irritation). Triamcinolone 0.1% and SSD 3:1  . [DISCONTINUED] rivaroxaban (XARELTO) 10 MG TABS tablet Take 1 tablet (10 mg total) by mouth daily with breakfast. Take Xarelto for two and a half more weeks, then discontinue Xarelto. Once the patient has completed the Xarelto, they may resume the 325 mg Aspirin.  . [DISCONTINUED] traMADol (ULTRAM) 50 MG tablet Take 1-2 tablets (50-100 mg total) by mouth every 6 (six) hours as needed (mild pain).   No facility-administered encounter medications on file as of 12/06/2014.    Allergies (verified) Crestor; Lipitor; and Pravastatin -Muscle pain  History: Past Medical History  Diagnosis Date  . Hypertension   . Goiter   . Obese   . Osteopenia   . Fatigue   . Gastric polyp   . Diverticulosis   . Rosacea   . Postmenopausal   . Vitamin D deficiency   . Hyperlipidemia   . Diabetes mellitus without complication   . 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 Complication of anesthesia   . PONV (postoperative nausea and vomiting)   . Hypothyroid  taken off of thyroid medication 2012014   . Arthritis    Past Surgical History  Procedure Laterality Date  . Abdominal hysterectomy  1985  . Foot surgery Right 08/2002  . Tonsillectomy    . Right knee arthroscopy   2013   . Total knee arthroplasty Right 07/25/2014    Procedure: RIGHT TOTAL KNEE ARTHROPLASTY;  Surgeon: Gearlean Alf, MD;  Location: [facility];  Service: Orthopedics;  Laterality: Right;   Family History  Problem Relation Age of Onset  . Osteoporosis Mother   . Alzheimer's disease Mother 23  . Arthritis Mother   . Cancer Father       Lung  . Arthritis Sister   . Obesity Sister   . Heart attack Brother 27  . Heart disease Son   . Arthritis Sister    Social History   Occupational History  . 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 Smoking status: Never Smoker   . Smokeless tobacco: Never Used  . Alcohol Use: No  . Drug Use: No  . Sexual Activity: No    Activities of Daily Living In your present state of health, do you have any difficulty performing the following activities: 12/06/2014 07/25/2014  Hearing? N N  Vision? Y N  Difficulty concentrating or making decisions? N N  Walking or climbing stairs? Y Y  Dressing or bathing? N N  Doing errands, shopping? N N  Preparing Food and eating ? N -  Using the Toilet? N -  In the past six months, have you accidently leaked urine? Y -  Do you have problems with loss of bowel control? N -  Managing your Medications? N -  Managing your Finances? N -  Housekeeping or managing your Housekeeping? N -    Immunizations and Health Maintenance Immunization History  Administered Date(s) Administered  . Influenza Whole 05/16/2012  . Influenza,inj,Quad PF,36+ Mos 03/01/2013, 04/06/2014  . Pneumococcal Conjugate-13 12/06/2014  . Pneumococcal Polysaccharide-23 11/29/2005  . Td 09/14/2002  . Zoster 04/17/2011   Health Maintenance Due  Topic Date Due  . PNA vac Low Risk Adult (2 of 2 - PCV13) 11/30/2006  . TETANUS/TDAP  09/13/2012  . MAMMOGRAM  09/13/2013  . FOOT EXAM  03/04/2014  . HEMOGLOBIN A1C  08/29/2014  . URINE MICROALBUMIN  09/11/2014    Patient Care Team: Chevis Pretty, FNP as PCP - General (Nurse Practitioner)  Clent Jacks, MD-Consulting Physician (Opthalmology) Gaynelle Arabian, MD- Consulting Physician (Orthopedic)  Right total knee arthroscopy by Dr Wynelle Link in 07/2014.     Assessment:   This is a routine wellness examination for Sheila Ray.   Hearing/Vision screen No hearing or vision deficit noted  during visit  Dietary issues and exercise activities discussed: Current Exercise Habits:: Home exercise routine (She also walks a lot during her daily job), Type of exercise: Other - see comments (Leg exercises), Time (Minutes): 30, Frequency (Times/Week): 6, Weekly Exercise (Minutes/Week): 180, Intensity: Moderate  Goals    . Exercise 3x per week (30 min per time)     Walk for at least 30 minutes 3 times per week      Depression Screen PHQ 2/9 Scores 12/06/2014 09/10/2013  PHQ - 2 Score 0 0    Fall Risk Fall Risk  12/06/2014 09/10/2013  Falls in the past year? No No    Cognitive Function: MMSE - Mini Mental State Exam 12/06/2014  Orientation to time 5  Orientation to Place 5  Registration 3  Attention/ Calculation 5  Recall 3  Language- name 2 objects 2  Language- repeat 1  Language- follow 3 step command 3  Language- read & follow direction 1  Write a sentence 1  Copy design 1  Total score 30  No deficit noted  Screening Tests Health Maintenance  Topic Date Due  . PNA vac Low Risk Adult (2 of 2 - PCV13) 11/30/2006  . TETANUS/TDAP  09/13/2012  . MAMMOGRAM  09/13/2013  . FOOT EXAM  03/04/2014  . HEMOGLOBIN A1C  08/29/2014  . URINE MICROALBUMIN  09/11/2014  . OPHTHALMOLOGY EXAM  12/25/2014  . INFLUENZA VACCINE  01/30/2015  . COLONOSCOPY  07/01/2017  . DEXA SCAN  Completed  . ZOSTAVAX  Completed      Plan:  DASH diet to help with blood pressure Schedule eye exam with Dr Katy Fitch. Have him send a copy of the report to our office.  Move carefully to avoid falls.  Mammogram on 01/25/15 at 9:45 at the mobile unit here at [facility]. Avoid very ripe fruits because they are higher in sugar.  Try to walk for at least 30 minutes 3 times per week.  Continue to stay active and involved in community activities.  Keep appt with [name], FNP. She will review Dexa scan results that day.  Return FOBT Review Advance Directives and bring a signed copy to  our office Kegel exercises to help strengthen pelvic floor muscles and decrease incontinence.    During the course of the visit, Sheila Ray was educated and counseled about the following appropriate screening and preventive services:   Vaccines to include Prevnar-given today, Influenza-suggested anually, Tdap-Too expensive. Check cost at next visit, Zostavax-done 04/17/11  Electrocardiogram-done 05/2014  Cardiovascular disease screening-Lipids checked 01/2014. To be checked at next visit.  Colorectal cancer screening-Neg colonoscopy 2009. FOBT given today.  Bone density screening-Done today. Results to be discussed at next visit  Glaucoma screening-Last eye exam was with Dr Katy Fitch last year. She will schedule one this year and have him send a copy of the report to our office.   Mammography-Scheduled for 01/25/15 at the mobile unit  Nutrition counseling-DASH diet. Avoid very ripe fruits.   Patient Instructions (the written plan) were given to the patient.    Ilean China, RN   12/06/2014      I have reviewed and agree with the above AWV documentation.  Claretta Fraise, M.D.

## 2015-01-04 ENCOUNTER — Ambulatory Visit (INDEPENDENT_AMBULATORY_CARE_PROVIDER_SITE_OTHER): Payer: Medicare Other | Admitting: Family

## 2015-01-04 ENCOUNTER — Encounter: Payer: Self-pay | Admitting: Family

## 2015-01-04 VITALS — BP 153/82 | HR 67 | Temp 97.4°F | Ht 64.5 in | Wt 177.6 lb

## 2015-01-04 DIAGNOSIS — Z1212 Encounter for screening for malignant neoplasm of rectum: Secondary | ICD-10-CM | POA: Diagnosis not present

## 2015-01-04 DIAGNOSIS — I10 Essential (primary) hypertension: Secondary | ICD-10-CM | POA: Diagnosis not present

## 2015-01-04 DIAGNOSIS — E785 Hyperlipidemia, unspecified: Secondary | ICD-10-CM | POA: Diagnosis not present

## 2015-01-04 DIAGNOSIS — M1711 Unilateral primary osteoarthritis, right knee: Secondary | ICD-10-CM | POA: Diagnosis not present

## 2015-01-04 DIAGNOSIS — M810 Age-related osteoporosis without current pathological fracture: Secondary | ICD-10-CM | POA: Diagnosis not present

## 2015-01-04 DIAGNOSIS — Z0000 Encounter for general adult medical examination without abnormal findings: Secondary | ICD-10-CM | POA: Diagnosis not present

## 2015-01-04 DIAGNOSIS — E559 Vitamin D deficiency, unspecified: Secondary | ICD-10-CM | POA: Diagnosis not present

## 2015-01-04 DIAGNOSIS — E669 Obesity, unspecified: Secondary | ICD-10-CM

## 2015-01-04 DIAGNOSIS — E119 Type 2 diabetes mellitus without complications: Secondary | ICD-10-CM | POA: Diagnosis not present

## 2015-01-04 DIAGNOSIS — E039 Hypothyroidism, unspecified: Secondary | ICD-10-CM

## 2015-01-04 LAB — POCT GLYCOSYLATED HEMOGLOBIN (HGB A1C): Hemoglobin A1C: 6.2

## 2015-01-04 LAB — POCT UA - MICROALBUMIN: Microalbumin Ur, POC: NEGATIVE mg/L

## 2015-01-04 MED ORDER — ONETOUCH DELICA LANCETS 33G MISC: 1.0000 | Freq: Every day | Status: DC

## 2015-01-04 MED ORDER — METFORMIN HCL 1000 MG PO TABS: ORAL_TABLET | ORAL | Status: DC

## 2015-01-04 MED ORDER — GLUCOSE BLOOD VI STRP: ORAL_STRIP | Status: DC

## 2015-01-04 MED ORDER — AMLODIPINE BESYLATE 5 MG PO TABS: 5.0000 mg | ORAL_TABLET | Freq: Every day | ORAL | Status: DC

## 2015-01-04 MED ORDER — ALENDRONATE SODIUM 70 MG PO TABS: 70.0000 mg | ORAL_TABLET | ORAL | Status: DC

## 2015-01-04 NOTE — Patient Instructions (Signed)

## 2015-01-04 NOTE — Addendum Note (Signed)
Addended by: Selmer Dominion on: 01/04/2015 03:25 PM   Modules accepted: Orders

## 2015-01-04 NOTE — Progress Notes (Signed)
Subjective:    Patient ID: Sheila Ray, female    DOB: 11-13-1940, 74 y.o.   MRN: [id number]  Pt presents to the office for annual physical without pap.  Diabetes She presents for her follow-up diabetic visit. She has type 2 diabetes mellitus. Her disease course has been stable. There are no hypoglycemic associated symptoms. Pertinent negatives for hypoglycemia include no headaches. Pertinent negatives for diabetes include no blurred vision, no foot paresthesias, no foot ulcerations and no visual change. There are no hypoglycemic complications. Symptoms are stable. Diabetic complications include a CVA. Pertinent negatives for diabetic complications include no heart disease, nephropathy or peripheral neuropathy. Risk factors for coronary artery disease include dyslipidemia, diabetes mellitus, hypertension and post-menopausal. Current diabetic treatment includes oral agent (monotherapy). Her breakfast blood glucose range is generally 110-130 mg/dl. An ACE inhibitor/angiotensin II receptor blocker is being taken. Eye exam is not current.  Hypertension This is a chronic problem. The current episode started more than 1 year ago. The problem has been resolved since onset. The problem is uncontrolled. Pertinent negatives include no anxiety, blurred vision, headaches, palpitations, peripheral edema or shortness of breath. Risk factors for coronary artery disease include dyslipidemia, diabetes mellitus, obesity, post-menopausal state and sedentary lifestyle. Past treatments include calcium channel blockers and ACE inhibitors. The current treatment provides mild improvement. Hypertensive end-organ damage includes CVA. There is no history of kidney disease, CAD/MI or a thyroid problem. There is no history of sleep apnea.  Hyperlipidemia This is a chronic problem. The current episode started more than 1 year ago. The problem is uncontrolled. Recent lipid tests were reviewed and are high. Exacerbating diseases  include diabetes. Factors aggravating her hyperlipidemia include fatty foods. Pertinent negatives include no leg pain or shortness of breath. Current antihyperlipidemic treatment includes diet change. The current treatment provides mild improvement of lipids. Risk factors for coronary artery disease include diabetes mellitus, dyslipidemia, hypertension, obesity and post-menopausal.      Review of Systems  Constitutional: Negative.   HENT: Negative.   Eyes: Negative.  Negative for blurred vision.  Respiratory: Negative.  Negative for shortness of breath.   Cardiovascular: Negative.  Negative for palpitations.  Gastrointestinal: Negative.   Endocrine: Negative.   Genitourinary: Negative.   Musculoskeletal: Negative.   Neurological: Negative.  Negative for headaches.  Hematological: Negative.   Psychiatric/Behavioral: Negative.   All other systems reviewed and are negative.      Objective:   Physical Exam  Constitutional: She is oriented to person, place, and time. She appears well-developed and well-nourished. No distress.  HENT:  Head: Normocephalic and atraumatic.  Right Ear: External ear normal.  Mouth/Throat: Oropharynx is clear and moist.  Eyes: Pupils are equal, round, and reactive to light.  Neck: Normal range of motion. Neck supple. No thyromegaly present.  Cardiovascular: Normal rate, regular rhythm, normal heart sounds and intact distal pulses.   No murmur heard. Pulmonary/Chest: Effort normal and breath sounds normal. No respiratory distress. She has no wheezes.  Abdominal: Soft. Bowel sounds are normal. She exhibits no distension. There is no tenderness.  Musculoskeletal: Normal range of motion. She exhibits no edema or tenderness.  Neurological: She is alert and oriented to person, place, and time. She has normal reflexes. No cranial nerve deficit.  Skin: Skin is warm and dry.  Psychiatric: She has a normal mood and affect. Her behavior is normal. Judgment and thought  content normal.  Vitals reviewed.     BP 153/82 mmHg  Pulse 67  Temp(Src) 97.4 F (36.3  C) (Oral)  Ht 5' 4.5" (1.638 m)  Wt 177 lb 9.6 oz (80.559 kg)  BMI 30.03 kg/m2     Assessment & Plan:  1. Essential hypertension - CMP14+EGFR - amLODipine (NORVASC) 5 MG tablet; Take 1 tablet (5 mg total) by mouth daily.  Dispense: 90 tablet; Refill: 3  2. Hypothyroidism, unspecified hypothyroidism type - Thyroid Panel With TSH  3. Type 2 diabetes mellitus without complication - POCT glycosylated hemoglobin (Hb A1C) - POCT UA - Microalbumin - metFORMIN (GLUCOPHAGE) 1000 MG tablet; TAKE ONE TABLET BY MOUTH with breaksfast  Dispense: 90 tablet; Refill: 3 - glucose blood (ONE TOUCH ULTRA TEST) test strip; USE TO CHECK BLOOD GLUCOSE ONCE A DAY AS INSTRUCTED  Dispense: 100 each; Refill: 3 - ONETOUCH DELICA LANCETS 10X MISC; 1 each by Does not apply route daily. Use to check BG daily Dx:  250.02  Dispense: 100 each; Refill: 2  4. HLD (hyperlipidemia) - Lipid panel  5. Vitamin D deficiency - Vit D  25 hydroxy (rtn osteoporosis monitoring)  6. Osteoporosis - alendronate (FOSAMAX) 70 MG tablet; Take 1 tablet (70 mg total) by mouth every 7 (seven) days. Take with a full glass of water on an empty stomach.  Dispense: 4 tablet; Refill: 11  7. Primary osteoarthritis of right knee  8. Obesity (BMI 30-39.9)   Continue all meds Labs pending Health Maintenance reviewed Diet and exercise encouraged RTO 3 months  [name], FNP

## 2015-01-05 ENCOUNTER — Other Ambulatory Visit: Payer: Self-pay | Admitting: Family

## 2015-01-05 LAB — CMP14+EGFR
ALT: 10 IU/L (ref 0–32)
AST: 12 IU/L (ref 0–40)
Albumin/Globulin Ratio: 1.9 (ref 1.1–2.5)
Albumin: 4.3 g/dL (ref 3.5–4.8)
Alkaline Phosphatase: 73 IU/L (ref 39–117)
BUN/Creatinine Ratio: 16 (ref 11–26)
BUN: 11 mg/dL (ref 8–27)
Bilirubin Total: 0.4 mg/dL (ref 0.0–1.2)
CO2: 23 mmol/L (ref 18–29)
Calcium: 10.1 mg/dL (ref 8.7–10.3)
Chloride: 101 mmol/L (ref 97–108)
Creatinine, Ser: 0.68 mg/dL (ref 0.57–1.00)
GFR calc Af Amer: 100 mL/min/{1.73_m2} (ref >59)
GFR calc non Af Amer: 86 mL/min/{1.73_m2} (ref >59)
Globulin, Total: 2.3 g/dL (ref 1.5–4.5)
Glucose: 109 mg/dL — ABNORMAL HIGH (ref 65–99)
Potassium: 4.6 mmol/L (ref 3.5–5.2)
Sodium: 141 mmol/L (ref 134–144)
Total Protein: 6.6 g/dL (ref 6.0–8.5)

## 2015-01-05 LAB — THYROID PANEL WITH TSH
Free Thyroxine Index: 2.8 (ref 1.2–4.9)
T3 Uptake Ratio: 27 % (ref 24–39)
T4, Total: 10.2 ug/dL (ref 4.5–12.0)
TSH: 1.72 u[IU]/mL (ref 0.450–4.500)

## 2015-01-05 LAB — VITAMIN D 25 HYDROXY (VIT D DEFICIENCY, FRACTURES): Vit D, 25-Hydroxy: 24.2 ng/mL — ABNORMAL LOW (ref 30.0–100.0)

## 2015-01-05 LAB — LIPID PANEL
Chol/HDL Ratio: 4.8 ratio units — ABNORMAL HIGH (ref 0.0–4.4)
Cholesterol, Total: 230 mg/dL — ABNORMAL HIGH (ref 100–199)
HDL: 48 mg/dL (ref >39)
LDL Calculated: 158 mg/dL — ABNORMAL HIGH (ref 0–99)
Triglycerides: 121 mg/dL (ref 0–149)
VLDL Cholesterol Cal: 24 mg/dL (ref 5–40)

## 2015-01-05 MED ORDER — VITAMIN D (ERGOCALCIFEROL) 1.25 MG (50000 UNIT) PO CAPS: 50000.0000 [IU] | ORAL_CAPSULE | ORAL | Status: DC

## 2015-01-06 LAB — FECAL OCCULT BLOOD, IMMUNOCHEMICAL: Fecal Occult Bld: NEGATIVE

## 2015-01-09 ENCOUNTER — Telehealth: Payer: Self-pay | Admitting: *Deleted

## 2015-01-09 NOTE — Telephone Encounter (Signed)
-----   Message from Sharion Balloon, [place] sent at 01/07/2015 11:37 AM EDT ----- FOBT-negative

## 2015-01-11 ENCOUNTER — Telehealth: Payer: Self-pay | Admitting: *Deleted

## 2015-01-11 NOTE — Telephone Encounter (Signed)
-----   Message from Sharion Balloon, [place] sent at 01/07/2015 11:37 AM EDT ----- FOBT-negative

## 2015-01-11 NOTE — Telephone Encounter (Signed)
Pt notified of results Verbalizes understanding 

## 2015-01-25 DIAGNOSIS — Z1231 Encounter for screening mammogram for malignant neoplasm of breast: Secondary | ICD-10-CM | POA: Diagnosis not present

## 2015-01-27 DIAGNOSIS — Z471 Aftercare following joint replacement surgery: Secondary | ICD-10-CM | POA: Diagnosis not present

## 2015-01-27 DIAGNOSIS — Z96651 Presence of right artificial knee joint: Secondary | ICD-10-CM | POA: Diagnosis not present

## 2015-02-02 ENCOUNTER — Other Ambulatory Visit: Payer: Self-pay | Admitting: Pharmacist

## 2015-02-14 DIAGNOSIS — R920 Mammographic microcalcification found on diagnostic imaging of breast: Secondary | ICD-10-CM | POA: Diagnosis not present

## 2015-02-14 DIAGNOSIS — N6002 Solitary cyst of left breast: Secondary | ICD-10-CM | POA: Diagnosis not present

## 2015-03-07 DIAGNOSIS — M79675 Pain in left toe(s): Secondary | ICD-10-CM | POA: Diagnosis not present

## 2015-03-07 DIAGNOSIS — M2042 Other hammer toe(s) (acquired), left foot: Secondary | ICD-10-CM | POA: Diagnosis not present

## 2015-03-07 DIAGNOSIS — M779 Enthesopathy, unspecified: Secondary | ICD-10-CM | POA: Diagnosis not present

## 2015-03-09 DIAGNOSIS — M79675 Pain in left toe(s): Secondary | ICD-10-CM | POA: Diagnosis not present

## 2015-03-09 DIAGNOSIS — M779 Enthesopathy, unspecified: Secondary | ICD-10-CM | POA: Diagnosis not present

## 2015-03-16 DIAGNOSIS — M899 Disorder of bone, unspecified: Secondary | ICD-10-CM | POA: Diagnosis not present

## 2015-03-16 DIAGNOSIS — L84 Corns and callosities: Secondary | ICD-10-CM | POA: Diagnosis not present

## 2015-03-16 DIAGNOSIS — Z8673 Personal history of transient ischemic attack (TIA), and cerebral infarction without residual deficits: Secondary | ICD-10-CM | POA: Diagnosis not present

## 2015-03-16 DIAGNOSIS — E119 Type 2 diabetes mellitus without complications: Secondary | ICD-10-CM | POA: Diagnosis not present

## 2015-03-16 DIAGNOSIS — I10 Essential (primary) hypertension: Secondary | ICD-10-CM | POA: Diagnosis not present

## 2015-03-16 DIAGNOSIS — Z96651 Presence of right artificial knee joint: Secondary | ICD-10-CM | POA: Diagnosis not present

## 2015-03-16 DIAGNOSIS — M25775 Osteophyte, left foot: Secondary | ICD-10-CM | POA: Diagnosis not present

## 2015-03-16 DIAGNOSIS — Z79899 Other long term (current) drug therapy: Secondary | ICD-10-CM | POA: Diagnosis not present

## 2015-03-16 DIAGNOSIS — M1990 Unspecified osteoarthritis, unspecified site: Secondary | ICD-10-CM | POA: Diagnosis not present

## 2015-03-16 DIAGNOSIS — M7752 Other enthesopathy of left foot: Secondary | ICD-10-CM | POA: Diagnosis not present

## 2015-03-16 DIAGNOSIS — M25772 Osteophyte, left ankle: Secondary | ICD-10-CM | POA: Diagnosis not present

## 2015-03-28 DIAGNOSIS — M79675 Pain in left toe(s): Secondary | ICD-10-CM | POA: Diagnosis not present

## 2015-03-28 DIAGNOSIS — M779 Enthesopathy, unspecified: Secondary | ICD-10-CM | POA: Diagnosis not present

## 2015-03-30 DIAGNOSIS — I781 Nevus, non-neoplastic: Secondary | ICD-10-CM | POA: Diagnosis not present

## 2015-03-30 DIAGNOSIS — L814 Other melanin hyperpigmentation: Secondary | ICD-10-CM | POA: Diagnosis not present

## 2015-03-30 DIAGNOSIS — L570 Actinic keratosis: Secondary | ICD-10-CM | POA: Diagnosis not present

## 2015-03-30 DIAGNOSIS — L679 Hair color and hair shaft abnormality, unspecified: Secondary | ICD-10-CM | POA: Diagnosis not present

## 2015-04-18 ENCOUNTER — Encounter: Payer: Self-pay | Admitting: Nurse Practitioner

## 2015-04-24 ENCOUNTER — Encounter: Payer: Self-pay | Admitting: Family

## 2015-04-29 DIAGNOSIS — R531 Weakness: Secondary | ICD-10-CM | POA: Diagnosis not present

## 2015-04-29 DIAGNOSIS — E119 Type 2 diabetes mellitus without complications: Secondary | ICD-10-CM | POA: Diagnosis not present

## 2015-04-29 DIAGNOSIS — I10 Essential (primary) hypertension: Secondary | ICD-10-CM | POA: Diagnosis not present

## 2015-05-04 ENCOUNTER — Telehealth: Payer: Self-pay | Admitting: Family

## 2015-05-04 DIAGNOSIS — N6452 Nipple discharge: Secondary | ICD-10-CM

## 2015-05-04 NOTE — Telephone Encounter (Signed)
Patient has had some left nipple drainage and would like to have diagnostic mammogram at [facility].

## 2015-05-04 NOTE — Telephone Encounter (Signed)
Breast referral sent

## 2015-05-18 ENCOUNTER — Ambulatory Visit: Payer: Medicare Other | Admitting: Family

## 2015-05-19 ENCOUNTER — Encounter: Payer: Self-pay | Admitting: Family

## 2015-05-19 ENCOUNTER — Ambulatory Visit (INDEPENDENT_AMBULATORY_CARE_PROVIDER_SITE_OTHER): Payer: Medicare Other | Admitting: Family

## 2015-05-19 VITALS — BP 128/73 | HR 69 | Temp 97.9°F | Ht 64.0 in | Wt 177.2 lb

## 2015-05-19 DIAGNOSIS — M810 Age-related osteoporosis without current pathological fracture: Secondary | ICD-10-CM | POA: Diagnosis not present

## 2015-05-19 DIAGNOSIS — I10 Essential (primary) hypertension: Secondary | ICD-10-CM | POA: Diagnosis not present

## 2015-05-19 DIAGNOSIS — E119 Type 2 diabetes mellitus without complications: Secondary | ICD-10-CM

## 2015-05-19 DIAGNOSIS — Z23 Encounter for immunization: Secondary | ICD-10-CM | POA: Diagnosis not present

## 2015-05-19 DIAGNOSIS — E785 Hyperlipidemia, unspecified: Secondary | ICD-10-CM

## 2015-05-19 DIAGNOSIS — M1711 Unilateral primary osteoarthritis, right knee: Secondary | ICD-10-CM

## 2015-05-19 DIAGNOSIS — E669 Obesity, unspecified: Secondary | ICD-10-CM | POA: Diagnosis not present

## 2015-05-19 DIAGNOSIS — E039 Hypothyroidism, unspecified: Secondary | ICD-10-CM

## 2015-05-19 DIAGNOSIS — E559 Vitamin D deficiency, unspecified: Secondary | ICD-10-CM

## 2015-05-19 LAB — POCT GLYCOSYLATED HEMOGLOBIN (HGB A1C): Hemoglobin A1C: 6.3

## 2015-05-19 MED ORDER — METFORMIN HCL 1000 MG PO TABS: ORAL_TABLET | ORAL | Status: DC

## 2015-05-19 MED ORDER — AMLODIPINE BESYLATE 5 MG PO TABS: 5.0000 mg | ORAL_TABLET | Freq: Every day | ORAL | Status: DC

## 2015-05-19 NOTE — Patient Instructions (Signed)
Health Maintenance, Female Adopting a healthy lifestyle and getting preventive care can go a long way to promote health and wellness. Talk with your health care provider about what schedule of regular examinations is right for you. This is a good chance for you to check in with your provider about disease prevention and staying healthy. In between checkups, there are plenty of things you can do on your own. Experts have done a lot of research about which lifestyle changes and preventive measures are most likely to keep you healthy. Ask your health care provider for more information. WEIGHT AND DIET  Eat a healthy diet  Be sure to include plenty of vegetables, fruits, low-fat dairy products, and lean protein.  Do not eat a lot of foods high in solid fats, added sugars, or salt.  Get regular exercise. This is one of the most important things you can do for your health.  Most adults should exercise for at least 150 minutes each week. The exercise should increase your heart rate and make you sweat (moderate-intensity exercise).  Most adults should also do strengthening exercises at least twice a week. This is in addition to the moderate-intensity exercise.  Maintain a healthy weight  Body mass index (BMI) is a measurement that can be used to identify possible weight problems. It estimates body fat based on height and weight. Your health care provider can help determine your BMI and help you achieve or maintain a healthy weight.  For females 20 years of age and older:   A BMI below 18.5 is considered underweight.  A BMI of 18.5 to 24.9 is normal.  A BMI of 25 to 29.9 is considered overweight.  A BMI of 30 and above is considered obese.  Watch levels of cholesterol and blood lipids  You should start having your blood tested for lipids and cholesterol at 74 years of age, then have this test every 5 years.  You may need to have your cholesterol levels checked more often if:  Your lipid  or cholesterol levels are high.  You are older than 74 years of age.  You are at high risk for heart disease.  CANCER SCREENING   Lung Cancer  Lung cancer screening is recommended for adults 55-80 years old who are at high risk for lung cancer because of a history of smoking.  A yearly low-dose CT scan of the lungs is recommended for people who:  Currently smoke.  Have quit within the past 15 years.  Have at least a 30-pack-year history of smoking. A pack year is smoking an average of one pack of cigarettes a day for 1 year.  Yearly screening should continue until it has been 15 years since you quit.  Yearly screening should stop if you develop a health problem that would prevent you from having lung cancer treatment.  Breast Cancer  Practice breast self-awareness. This means understanding how your breasts normally appear and feel.  It also means doing regular breast self-exams. Let your health care provider know about any changes, no matter how small.  If you are in your 20s or 30s, you should have a clinical breast exam (CBE) by a health care provider every 1-3 years as part of a regular health exam.  If you are 40 or older, have a CBE every year. Also consider having a breast X-ray (mammogram) every year.  If you have a family history of breast cancer, talk to your health care provider about genetic screening.  If you   are at high risk for breast cancer, talk to your health care provider about having an MRI and a mammogram every year.  Breast cancer gene (BRCA) assessment is recommended for women who have family members with BRCA-related cancers. BRCA-related cancers include:  Breast.  Ovarian.  Tubal.  Peritoneal cancers.  Results of the assessment will determine the need for genetic counseling and BRCA1 and BRCA2 testing. Cervical Cancer Your health care provider may recommend that you be screened regularly for cancer of the pelvic organs (ovaries, uterus, and  vagina). This screening involves a pelvic examination, including checking for microscopic changes to the surface of your cervix (Pap test). You may be encouraged to have this screening done every 3 years, beginning at age 21.  For women ages 30-65, health care providers may recommend pelvic exams and Pap testing every 3 years, or they may recommend the Pap and pelvic exam, combined with testing for human papilloma virus (HPV), every 5 years. Some types of HPV increase your risk of cervical cancer. Testing for HPV may also be done on women of any age with unclear Pap test results.  Other health care providers may not recommend any screening for nonpregnant women who are considered low risk for pelvic cancer and who do not have symptoms. Ask your health care provider if a screening pelvic exam is right for you.  If you have had past treatment for cervical cancer or a condition that could lead to cancer, you need Pap tests and screening for cancer for at least 20 years after your treatment. If Pap tests have been discontinued, your risk factors (such as having a new sexual partner) need to be reassessed to determine if screening should resume. Some women have medical problems that increase the chance of getting cervical cancer. In these cases, your health care provider may recommend more frequent screening and Pap tests. Colorectal Cancer  This type of cancer can be detected and often prevented.  Routine colorectal cancer screening usually begins at 74 years of age and continues through 75 years of age.  Your health care provider may recommend screening at an earlier age if you have risk factors for colon cancer.  Your health care provider may also recommend using home test kits to check for hidden blood in the stool.  A small camera at the end of a tube can be used to examine your colon directly (sigmoidoscopy or colonoscopy). This is done to check for the earliest forms of colorectal  cancer.  Routine screening usually begins at age 50.  Direct examination of the colon should be repeated every 5-10 years through 75 years of age. However, you may need to be screened more often if early forms of precancerous polyps or small growths are found. Skin Cancer  Check your skin from head to toe regularly.  Tell your health care provider about any new moles or changes in moles, especially if there is a change in a mole's shape or color.  Also tell your health care provider if you have a mole that is larger than the size of a pencil eraser.  Always use sunscreen. Apply sunscreen liberally and repeatedly throughout the day.  Protect yourself by wearing long sleeves, pants, a wide-brimmed hat, and sunglasses whenever you are outside. HEART DISEASE, DIABETES, AND HIGH BLOOD PRESSURE   High blood pressure causes heart disease and increases the risk of stroke. High blood pressure is more likely to develop in:  People who have blood pressure in the high end   of the normal range (130-139/85-89 mm Hg).  People who are overweight or obese.  People who are African American.  If you are 38-23 years of age, have your blood pressure checked every 3-5 years. If you are 61 years of age or older, have your blood pressure checked every year. You should have your blood pressure measured twice--once when you are at a hospital or clinic, and once when you are not at a hospital or clinic. Record the average of the two measurements. To check your blood pressure when you are not at a hospital or clinic, you can use:  An automated blood pressure machine at a pharmacy.  A home blood pressure monitor.  If you are between 45 years and 39 years old, ask your health care provider if you should take aspirin to prevent strokes.  Have regular diabetes screenings. This involves taking a blood sample to check your fasting blood sugar level.  If you are at a normal weight and have a low risk for diabetes,  have this test once every three years after 74 years of age.  If you are overweight and have a high risk for diabetes, consider being tested at a younger age or more often. PREVENTING INFECTION  Hepatitis B  If you have a higher risk for hepatitis B, you should be screened for this virus. You are considered at high risk for hepatitis B if:  You were born in a country where hepatitis B is common. Ask your health care provider which countries are considered high risk.  Your parents were born in a high-risk country, and you have not been immunized against hepatitis B (hepatitis B vaccine).  You have HIV or AIDS.  You use needles to inject street drugs.  You live with someone who has hepatitis B.  You have had sex with someone who has hepatitis B.  You get hemodialysis treatment.  You take certain medicines for conditions, including cancer, organ transplantation, and autoimmune conditions. Hepatitis C  Blood testing is recommended for:  Everyone born from [birth year] through 1965.  Anyone with known risk factors for hepatitis C. Sexually transmitted infections (STIs)  You should be screened for sexually transmitted infections (STIs) including gonorrhea and chlamydia if:  You are sexually active and are younger than 74 years of age.  You are older than 74 years of age and your health care provider tells you that you are at risk for this type of infection.  Your sexual activity has changed since you were last screened and you are at an increased risk for chlamydia or gonorrhea. Ask your health care provider if you are at risk.  If you do not have HIV, but are at risk, it may be recommended that you take a prescription medicine daily to prevent HIV infection. This is called pre-exposure prophylaxis (PrEP). You are considered at risk if:  You are sexually active and do not regularly use condoms or know the HIV status of your partner(s).  You take drugs by injection.  You are sexually  active with a partner who has HIV. Talk with your health care provider about whether you are at high risk of being infected with HIV. If you choose to begin PrEP, you should first be tested for HIV. You should then be tested every 3 months for as long as you are taking PrEP.  PREGNANCY   If you are premenopausal and you may become pregnant, ask your health care provider about preconception counseling.  If you may  become pregnant, take 400 to 800 micrograms (mcg) of folic acid every day.  If you want to prevent pregnancy, talk to your health care provider about birth control (contraception). OSTEOPOROSIS AND MENOPAUSE   Osteoporosis is a disease in which the bones lose minerals and strength with aging. This can result in serious bone fractures. Your risk for osteoporosis can be identified using a bone density scan.  If you are 61 years of age or older, or if you are at risk for osteoporosis and fractures, ask your health care provider if you should be screened.  Ask your health care provider whether you should take a calcium or vitamin D supplement to lower your risk for osteoporosis.  Menopause may have certain physical symptoms and risks.  Hormone replacement therapy may reduce some of these symptoms and risks. Talk to your health care provider about whether hormone replacement therapy is right for you.  HOME CARE INSTRUCTIONS   Schedule regular health, dental, and eye exams.  Stay current with your immunizations.   Do not use any tobacco products including cigarettes, chewing tobacco, or electronic cigarettes.  If you are pregnant, do not drink alcohol.  If you are breastfeeding, limit how much and how often you drink alcohol.  Limit alcohol intake to no more than 1 drink per day for nonpregnant women. One drink equals 12 ounces of beer, 5 ounces of wine, or 1 ounces of hard liquor.  Do not use street drugs.  Do not share needles.  Ask your health care provider for help if  you need support or information about quitting drugs.  Tell your health care provider if you often feel depressed.  Tell your health care provider if you have ever been abused or do not feel safe at home.   This information is not intended to replace advice given to you by your health care provider. Make sure you discuss any questions you have with your health care provider.   Document Released: 12/31/2010 Document Revised: 07/08/2014 Document Reviewed: 05/19/2013 Elsevier Interactive Patient Education [employer].

## 2015-05-19 NOTE — Progress Notes (Signed)
Subjective:    Patient ID: Sheila Ray, female    DOB: 1941/05/08, 74 y.o.   MRN: [id number]  Pt presents to the office today for chronic follow up and lab work.  Hypertension This is a chronic problem. The current episode started more than 1 year ago. The problem has been resolved since onset. The problem is uncontrolled. Pertinent negatives include no anxiety, headaches, palpitations, peripheral edema or shortness of breath. Risk factors for coronary artery disease include dyslipidemia, diabetes mellitus, obesity, post-menopausal state and sedentary lifestyle. Past treatments include calcium channel blockers and ACE inhibitors. The current treatment provides mild improvement. Hypertensive end-organ damage includes CVA. There is no history of kidney disease, CAD/MI or a thyroid problem. There is no history of sleep apnea.  Diabetes She presents for her follow-up diabetic visit. She has type 2 diabetes mellitus. Her disease course has been stable. There are no hypoglycemic associated symptoms. Pertinent negatives for hypoglycemia include no headaches. Pertinent negatives for diabetes include no foot paresthesias, no foot ulcerations and no visual change. There are no hypoglycemic complications. Symptoms are stable. Diabetic complications include a CVA. Pertinent negatives for diabetic complications include no heart disease, nephropathy or peripheral neuropathy. Risk factors for coronary artery disease include dyslipidemia, diabetes mellitus, hypertension and post-menopausal. Current diabetic treatment includes oral agent (monotherapy). Her breakfast blood glucose range is generally 110-130 mg/dl. An ACE inhibitor/angiotensin II receptor blocker is being taken. Eye exam is not current.  Hyperlipidemia This is a chronic problem. The current episode started more than 1 year ago. The problem is uncontrolled. Recent lipid tests were reviewed and are high. Exacerbating diseases include diabetes. Factors  aggravating her hyperlipidemia include fatty foods. Pertinent negatives include no leg pain or shortness of breath. Current antihyperlipidemic treatment includes diet change. The current treatment provides mild improvement of lipids. Risk factors for coronary artery disease include diabetes mellitus, dyslipidemia, hypertension, obesity and post-menopausal.      Review of Systems  Constitutional: Negative.   HENT: Negative.   Eyes: Negative.   Respiratory: Negative.  Negative for shortness of breath.   Cardiovascular: Negative.  Negative for palpitations.  Gastrointestinal: Negative.   Endocrine: Negative.   Genitourinary: Negative.   Musculoskeletal: Negative.   Neurological: Negative.  Negative for headaches.  Hematological: Negative.   Psychiatric/Behavioral: Negative.   All other systems reviewed and are negative.      Objective:   Physical Exam  Constitutional: She is oriented to person, place, and time. She appears well-developed and well-nourished. No distress.  HENT:  Head: Normocephalic and atraumatic.  Right Ear: External ear normal.  Left Ear: External ear normal.  Nose: Nose normal.  Mouth/Throat: Oropharynx is clear and moist.  Eyes: Pupils are equal, round, and reactive to light.  Neck: Normal range of motion. Neck supple. No thyromegaly present.  Cardiovascular: Normal rate, regular rhythm, normal heart sounds and intact distal pulses.   No murmur heard. Pulmonary/Chest: Effort normal and breath sounds normal. No respiratory distress. She has no wheezes.  Abdominal: Soft. Bowel sounds are normal. She exhibits no distension. There is no tenderness.  Musculoskeletal: Normal range of motion. She exhibits no edema or tenderness.  Neurological: She is alert and oriented to person, place, and time. She has normal reflexes. No cranial nerve deficit.  Skin: Skin is warm and dry.  Psychiatric: She has a normal mood and affect. Her behavior is normal. Judgment and thought  content normal.  Vitals reviewed.     BP 149/79 mmHg  Pulse 69  Temp(Src) 97.9 F (36.6 C) (Oral)  Ht 5' 4"  (1.626 m)  Wt 177 lb 3.2 oz (80.377 kg)  BMI 30.40 kg/m2     Assessment & Plan:  1. Essential hypertension - CMP14+EGFR - amLODipine (NORVASC) 5 MG tablet; Take 1 tablet (5 mg total) by mouth daily.  Dispense: 90 tablet; Refill: 1  2. Hypothyroidism, unspecified hypothyroidism type - CMP14+EGFR  3. Primary osteoarthritis of right knee - CMP14+EGFR  4. HLD (hyperlipidemia) - CMP14+EGFR - Lipid panel  5. Obesity (BMI 30-39.9) - CMP14+EGFR  6. Vitamin D deficiency - CMP14+EGFR - VITAMIN D 25 Hydroxy (Vit-D Deficiency, Fractures)  7. Osteoporosis - CMP14+EGFR  8. Type 2 diabetes mellitus without complication, without long-term current use of insulin (HCC)  - CMP14+EGFR - POCT glycosylated hemoglobin (Hb A1C) - metFORMIN (GLUCOPHAGE) 1000 MG tablet; TAKE ONE TABLET BY MOUTH with breaksfast  Dispense: 90 tablet; Refill: 1 - Ambulatory referral to Ophthalmology   Continue all meds Labs pending Health Maintenance reviewed Diet and exercise encouraged RTO 4 months   [name], FNP

## 2015-05-20 LAB — CMP14+EGFR
ALT: 13 IU/L (ref 0–32)
AST: 14 IU/L (ref 0–40)
Albumin/Globulin Ratio: 1.6 (ref 1.1–2.5)
Albumin: 4.1 g/dL (ref 3.5–4.8)
Alkaline Phosphatase: 57 IU/L (ref 39–117)
BUN/Creatinine Ratio: 18 (ref 11–26)
BUN: 13 mg/dL (ref 8–27)
Bilirubin Total: 0.3 mg/dL (ref 0.0–1.2)
CO2: 23 mmol/L (ref 18–29)
Calcium: 9.7 mg/dL (ref 8.7–10.3)
Chloride: 101 mmol/L (ref 97–106)
Creatinine, Ser: 0.73 mg/dL (ref 0.57–1.00)
GFR calc Af Amer: 94 mL/min/{1.73_m2} (ref >59)
GFR calc non Af Amer: 81 mL/min/{1.73_m2} (ref >59)
Globulin, Total: 2.5 g/dL (ref 1.5–4.5)
Glucose: 111 mg/dL — ABNORMAL HIGH (ref 65–99)
Potassium: 4.2 mmol/L (ref 3.5–5.2)
Sodium: 140 mmol/L (ref 136–144)
Total Protein: 6.6 g/dL (ref 6.0–8.5)

## 2015-05-20 LAB — LIPID PANEL
Chol/HDL Ratio: 5.4 ratio units — ABNORMAL HIGH (ref 0.0–4.4)
Cholesterol, Total: 247 mg/dL — ABNORMAL HIGH (ref 100–199)
HDL: 46 mg/dL (ref >39)
LDL Calculated: 167 mg/dL — ABNORMAL HIGH (ref 0–99)
Triglycerides: 169 mg/dL — ABNORMAL HIGH (ref 0–149)
VLDL Cholesterol Cal: 34 mg/dL (ref 5–40)

## 2015-05-20 LAB — VITAMIN D 25 HYDROXY (VIT D DEFICIENCY, FRACTURES): Vit D, 25-Hydroxy: 18.1 ng/mL — ABNORMAL LOW (ref 30.0–100.0)

## 2015-05-22 ENCOUNTER — Other Ambulatory Visit: Payer: Self-pay | Admitting: Family

## 2015-05-22 MED ORDER — VITAMIN D (ERGOCALCIFEROL) 1.25 MG (50000 UNIT) PO CAPS
50000.0000 [IU] | ORAL_CAPSULE | ORAL | Status: DC
Start: 2015-05-22 — End: 2016-06-12

## 2015-05-29 ENCOUNTER — Other Ambulatory Visit: Payer: Self-pay | Admitting: Family

## 2015-06-13 DIAGNOSIS — H2513 Age-related nuclear cataract, bilateral: Secondary | ICD-10-CM | POA: Diagnosis not present

## 2015-06-13 DIAGNOSIS — H52223 Regular astigmatism, bilateral: Secondary | ICD-10-CM | POA: Diagnosis not present

## 2015-06-13 DIAGNOSIS — H5203 Hypermetropia, bilateral: Secondary | ICD-10-CM | POA: Diagnosis not present

## 2015-06-13 DIAGNOSIS — E119 Type 2 diabetes mellitus without complications: Secondary | ICD-10-CM | POA: Diagnosis not present

## 2015-06-13 LAB — HM DIABETES EYE EXAM

## 2015-06-20 ENCOUNTER — Encounter: Payer: Self-pay | Admitting: *Deleted

## 2015-08-08 ENCOUNTER — Telehealth: Payer: Self-pay | Admitting: Family

## 2015-08-22 DIAGNOSIS — Z471 Aftercare following joint replacement surgery: Secondary | ICD-10-CM | POA: Diagnosis not present

## 2015-08-22 DIAGNOSIS — Z96651 Presence of right artificial knee joint: Secondary | ICD-10-CM | POA: Diagnosis not present

## 2015-08-30 ENCOUNTER — Encounter: Payer: Self-pay | Admitting: Family Medicine

## 2015-08-30 ENCOUNTER — Ambulatory Visit (INDEPENDENT_AMBULATORY_CARE_PROVIDER_SITE_OTHER): Payer: Medicare Other | Admitting: Family Medicine

## 2015-08-30 VITALS — BP 162/87 | HR 69 | Temp 97.5°F | Ht 64.0 in | Wt 181.0 lb

## 2015-08-30 DIAGNOSIS — J029 Acute pharyngitis, unspecified: Secondary | ICD-10-CM

## 2015-08-30 DIAGNOSIS — R6883 Chills (without fever): Secondary | ICD-10-CM

## 2015-08-30 DIAGNOSIS — J0100 Acute maxillary sinusitis, unspecified: Secondary | ICD-10-CM

## 2015-08-30 DIAGNOSIS — R0981 Nasal congestion: Secondary | ICD-10-CM

## 2015-08-30 LAB — POCT INFLUENZA A/B
Influenza A, POC: NEGATIVE
Influenza B, POC: NEGATIVE

## 2015-08-30 LAB — POCT RAPID STREP A (OFFICE): Rapid Strep A Screen: NEGATIVE

## 2015-08-30 MED ORDER — FLUTICASONE PROPIONATE 50 MCG/ACT NA SUSP: 2.0000 | Freq: Every day | NASAL | Status: DC

## 2015-08-30 MED ORDER — AMOXICILLIN-POT CLAVULANATE 875-125 MG PO TABS: 1.0000 | ORAL_TABLET | Freq: Two times a day (BID) | ORAL | Status: DC

## 2015-08-30 NOTE — Progress Notes (Signed)
   HPI  Patient presents today here for evaluation of acute illness.  Patient explains that over the last 2 days she's had chills, congestion, left ear pain and left-sided facial pain. She's also had some sore throat. She denies any shortness of breath but has very difficult time breathing at night due to nasal congestion. She's had malaise and subjective fever with chills.  She is tolerating foods and fluids easily. No chest pain.  PMH: Smoking status noted ROS: Per HPI  Objective: BP 162/87 mmHg  Pulse 69  Temp(Src) 97.5 F (36.4 C) (Oral)  Ht 5\' 4"  (1.626 m)  Wt 181 lb (82.101 kg)  BMI 31.05 kg/m2 Gen: NAD, alert, cooperative with exam HEENT: NCAT, left-sided maxillary sinus tenderness to palpation, bilateral TMs normal, some fluid was present on the left, oropharynx clear with some erythema in the pharynx CV: RRR, good S1/S2, no murmur Resp: CTABL, no wheezes, non-labored Ext: No edema, warm Neuro: Alert and oriented, No gross deficits  Assessment and plan:  # Maxillary sinusitis Treating with Augmentin given severity of symptoms Consider steroids IM, however considering diabetes I deferred this. Trying Flonase The clinic with any worsening or failure to improve as expected    Orders Placed This Encounter  Procedures  . Culture, Group A Strep    Order Specific Question:  Source    Answer:  throat  . POCT Influenza A/B  . POCT rapid strep A     Laroy Apple, MD [facility] 08/30/2015, 11:56 AM

## 2015-08-30 NOTE — Patient Instructions (Signed)
Great to meet you!  Sinusitis, Adult Sinusitis is redness, soreness, and puffiness (inflammation) of the air pockets in the bones of your face (sinuses). The redness, soreness, and puffiness can cause air and mucus to get trapped in your sinuses. This can allow germs to grow and cause an infection.  HOME CARE   Drink enough fluids to keep your pee (urine) clear or pale yellow.  Use a humidifier in your home.  Run a hot shower to create steam in the bathroom. Sit in the bathroom with the door closed. Breathe in the steam 3-4 times a day.  Put a warm, moist washcloth on your face 3-4 times a day, or as told by your doctor.  Use salt water sprays (saline sprays) to wet the thick fluid in your nose. This can help the sinuses drain.  Only take medicine as told by your doctor. GET HELP RIGHT AWAY IF:   Your pain gets worse.  You have very bad headaches.  You are sick to your stomach (nauseous).  You throw up (vomit).  You are very sleepy (drowsy) all the time.  Your face is puffy (swollen).  Your vision changes.  You have a stiff neck.  You have trouble breathing. MAKE SURE YOU:   Understand these instructions.  Will watch your condition.  Will get help right away if you are not doing well or get worse.   This information is not intended to replace advice given to you by your health care provider. Make sure you discuss any questions you have with your health care provider.   Document Released: 12/04/2007 Document Revised: 07/08/2014 Document Reviewed: 01/21/2012 Elsevier Interactive Patient Education [employer].

## 2015-09-01 LAB — CULTURE, GROUP A STREP: Strep A Culture: NEGATIVE

## 2015-09-08 ENCOUNTER — Telehealth: Payer: Self-pay | Admitting: Family

## 2015-09-08 DIAGNOSIS — Z1231 Encounter for screening mammogram for malignant neoplasm of breast: Secondary | ICD-10-CM

## 2015-09-08 NOTE — Telephone Encounter (Signed)
Referral placed.

## 2015-09-20 DIAGNOSIS — R920 Mammographic microcalcification found on diagnostic imaging of breast: Secondary | ICD-10-CM | POA: Diagnosis not present

## 2015-10-05 DIAGNOSIS — L820 Inflamed seborrheic keratosis: Secondary | ICD-10-CM | POA: Diagnosis not present

## 2015-10-19 ENCOUNTER — Encounter: Payer: Self-pay | Admitting: Family Medicine

## 2016-01-08 ENCOUNTER — Telehealth: Payer: Self-pay | Admitting: Family

## 2016-01-22 ENCOUNTER — Other Ambulatory Visit: Payer: Self-pay | Admitting: Nurse Practitioner

## 2016-01-22 DIAGNOSIS — M810 Age-related osteoporosis without current pathological fracture: Secondary | ICD-10-CM

## 2016-01-24 ENCOUNTER — Other Ambulatory Visit: Payer: Self-pay | Admitting: Family

## 2016-01-24 DIAGNOSIS — M810 Age-related osteoporosis without current pathological fracture: Secondary | ICD-10-CM

## 2016-03-05 ENCOUNTER — Telehealth: Payer: Self-pay | Admitting: Family

## 2016-03-05 MED ORDER — LISINOPRIL 40 MG PO TABS: 40.0000 mg | ORAL_TABLET | Freq: Every day | ORAL | 1 refills | Status: DC

## 2016-03-05 NOTE — Telephone Encounter (Signed)
Medication sent to pharmacy  

## 2016-03-07 ENCOUNTER — Other Ambulatory Visit: Payer: Self-pay | Admitting: Family

## 2016-03-15 ENCOUNTER — Other Ambulatory Visit: Payer: Self-pay | Admitting: Family

## 2016-03-15 DIAGNOSIS — I10 Essential (primary) hypertension: Secondary | ICD-10-CM

## 2016-03-21 ENCOUNTER — Other Ambulatory Visit: Payer: Self-pay | Admitting: Family

## 2016-03-21 DIAGNOSIS — M810 Age-related osteoporosis without current pathological fracture: Secondary | ICD-10-CM

## 2016-03-22 ENCOUNTER — Other Ambulatory Visit: Payer: Self-pay

## 2016-03-22 DIAGNOSIS — E119 Type 2 diabetes mellitus without complications: Secondary | ICD-10-CM

## 2016-03-22 DIAGNOSIS — R921 Mammographic calcification found on diagnostic imaging of breast: Secondary | ICD-10-CM | POA: Diagnosis not present

## 2016-03-22 MED ORDER — METFORMIN HCL 1000 MG PO TABS: ORAL_TABLET | ORAL | 0 refills | Status: DC

## 2016-04-17 ENCOUNTER — Telehealth: Payer: Self-pay | Admitting: Family

## 2016-04-29 DIAGNOSIS — Z79899 Other long term (current) drug therapy: Secondary | ICD-10-CM | POA: Diagnosis not present

## 2016-04-29 DIAGNOSIS — I10 Essential (primary) hypertension: Secondary | ICD-10-CM | POA: Diagnosis not present

## 2016-04-29 DIAGNOSIS — Z8673 Personal history of transient ischemic attack (TIA), and cerebral infarction without residual deficits: Secondary | ICD-10-CM | POA: Diagnosis not present

## 2016-04-29 DIAGNOSIS — E119 Type 2 diabetes mellitus without complications: Secondary | ICD-10-CM | POA: Diagnosis not present

## 2016-04-29 DIAGNOSIS — K760 Fatty (change of) liver, not elsewhere classified: Secondary | ICD-10-CM | POA: Diagnosis not present

## 2016-04-29 DIAGNOSIS — M1990 Unspecified osteoarthritis, unspecified site: Secondary | ICD-10-CM | POA: Diagnosis not present

## 2016-04-29 DIAGNOSIS — N3090 Cystitis, unspecified without hematuria: Secondary | ICD-10-CM | POA: Diagnosis not present

## 2016-04-29 DIAGNOSIS — Z7984 Long term (current) use of oral hypoglycemic drugs: Secondary | ICD-10-CM | POA: Diagnosis not present

## 2016-04-29 DIAGNOSIS — R1032 Left lower quadrant pain: Secondary | ICD-10-CM | POA: Diagnosis not present

## 2016-04-29 DIAGNOSIS — N390 Urinary tract infection, site not specified: Secondary | ICD-10-CM | POA: Diagnosis not present

## 2016-04-29 DIAGNOSIS — Z9660 Presence of unspecified orthopedic joint implant: Secondary | ICD-10-CM | POA: Diagnosis not present

## 2016-04-29 DIAGNOSIS — N3091 Cystitis, unspecified with hematuria: Secondary | ICD-10-CM | POA: Diagnosis not present

## 2016-04-29 DIAGNOSIS — Z7982 Long term (current) use of aspirin: Secondary | ICD-10-CM | POA: Diagnosis not present

## 2016-05-01 ENCOUNTER — Encounter: Payer: Self-pay | Admitting: Family Medicine

## 2016-05-01 ENCOUNTER — Ambulatory Visit (INDEPENDENT_AMBULATORY_CARE_PROVIDER_SITE_OTHER): Payer: Medicare Other | Admitting: Family Medicine

## 2016-05-01 VITALS — BP 134/68 | HR 71 | Temp 97.0°F | Ht 64.0 in | Wt 187.0 lb

## 2016-05-01 DIAGNOSIS — R1032 Left lower quadrant pain: Secondary | ICD-10-CM | POA: Diagnosis not present

## 2016-05-01 NOTE — Progress Notes (Signed)
   Subjective:    Patient ID: Sheila Ray, female    DOB: Nov 07, 1940, 75 y.o.   MRN: [id number]  HPI 75 year old female who went to the emergency room in [place] 48 hours ago with left lower quadrant pain and some hematuria. Final diagnosis was cystitis. She had urinalysis which was abnormal as well as CT scan. The CT scan showed no stones or other pathology. Her symptoms really suggestive of stone disease with her restlessness nausea and vomiting. She was started on Keflex and presents here today for follow-up. She reports that she does feel better.  Patient Active Problem List   Diagnosis Date Noted  . OA (osteoarthritis) of knee 07/25/2014  . Hypothyroid 03/04/2013  . Stroke ([place]) 01/15/2013  . Abscess or cellulitis, toe 01/11/2013  . Osteoporosis 12/02/2012  . DM (diabetes mellitus) ([place]) 10/22/2012  . HTN (hypertension) 10/22/2012  . HLD (hyperlipidemia) 10/22/2012  . Vitamin D deficiency 09/13/2012  . Obesity (BMI 30-39.9) 09/13/2012   Outpatient Encounter Prescriptions as of 05/01/2016  Medication Sig  . alendronate (FOSAMAX) 70 MG tablet TAKE 1 TABLET WEEKLY AS DIRECTED  . amLODipine (NORVASC) 5 MG tablet TAKE 1 TABLET (5 MG TOTAL) BY MOUTH DAILY.  Marland Kitchen aspirin 325 MG EC tablet Take 325 mg by mouth daily.  . cephALEXin (KEFLEX) 500 MG capsule Take 1 capsule by mouth 4 (four) times daily.  Marland Kitchen glucose blood (ONE TOUCH ULTRA TEST) test strip USE TO CHECK BLOOD GLUCOSE ONCE A DAY AS INSTRUCTED  . lisinopril (PRINIVIL,ZESTRIL) 40 MG tablet Take 1 tablet (40 mg total) by mouth daily.  . metFORMIN (GLUCOPHAGE) 1000 MG tablet TAKE ONE TABLET BY MOUTH with breaksfast  . ONETOUCH DELICA LANCETS 99991111 MISC 1 each by Does not apply route daily. Use to check BG daily Dx:  250.02  . phenazopyridine (PYRIDIUM) 200 MG tablet Take 1 tablet by mouth 3 (three) times daily.  . Vitamin D, Ergocalciferol, (DRISDOL) 50000 UNITS CAPS capsule Take 1 capsule (50,000 Units total) by mouth every 7  (seven) days.  . [DISCONTINUED] amoxicillin-clavulanate (AUGMENTIN) 875-125 MG tablet Take 1 tablet by mouth 2 (two) times daily.  . [DISCONTINUED] fluticasone (FLONASE) 50 MCG/ACT nasal spray Place 2 sprays into both nostrils daily.   No facility-administered encounter medications on file as of 05/01/2016.      Review of Systems  Constitutional: Negative.   Gastrointestinal: Positive for abdominal pain.  Genitourinary: Positive for frequency.       Objective:   Physical Exam  Constitutional: She appears well-developed and well-nourished.  Abdominal: Soft. There is no tenderness. There is no rebound and no guarding.   BP 134/68   Pulse 71   Temp 97 F (36.1 C) (Oral)   Ht 5\' 4"  (1.626 m)   Wt 187 lb (84.8 kg)   BMI 32.10 kg/m         Assessment & Plan:  1. Abdominal pain, left lower quadrant Symptoms are not new entirely consistent with diagnosis. She is better. Would like her to finish Keflex and repeat urine specimen. We checked to see if there was a culture pending but could not find one. The fact that she has improved Finger Keflex be an appropriate choice for her symptoms.  Wardell Honour MD

## 2016-05-05 DIAGNOSIS — Z7984 Long term (current) use of oral hypoglycemic drugs: Secondary | ICD-10-CM | POA: Diagnosis not present

## 2016-05-05 DIAGNOSIS — E119 Type 2 diabetes mellitus without complications: Secondary | ICD-10-CM | POA: Diagnosis not present

## 2016-05-05 DIAGNOSIS — I10 Essential (primary) hypertension: Secondary | ICD-10-CM | POA: Diagnosis not present

## 2016-05-05 DIAGNOSIS — Z79899 Other long term (current) drug therapy: Secondary | ICD-10-CM | POA: Diagnosis not present

## 2016-05-05 DIAGNOSIS — Z8673 Personal history of transient ischemic attack (TIA), and cerebral infarction without residual deficits: Secondary | ICD-10-CM | POA: Diagnosis not present

## 2016-05-05 DIAGNOSIS — Z7982 Long term (current) use of aspirin: Secondary | ICD-10-CM | POA: Diagnosis not present

## 2016-05-05 DIAGNOSIS — M545 Low back pain: Secondary | ICD-10-CM | POA: Diagnosis not present

## 2016-05-06 ENCOUNTER — Ambulatory Visit (INDEPENDENT_AMBULATORY_CARE_PROVIDER_SITE_OTHER): Payer: Medicare Other | Admitting: Pediatrics

## 2016-05-06 ENCOUNTER — Encounter: Payer: Self-pay | Admitting: Pediatrics

## 2016-05-06 VITALS — BP 158/80 | HR 108 | Temp 99.4°F | Ht 64.0 in | Wt 185.0 lb

## 2016-05-06 DIAGNOSIS — H9192 Unspecified hearing loss, left ear: Secondary | ICD-10-CM

## 2016-05-06 DIAGNOSIS — T887XXA Unspecified adverse effect of drug or medicament, initial encounter: Secondary | ICD-10-CM | POA: Diagnosis not present

## 2016-05-06 DIAGNOSIS — I10 Essential (primary) hypertension: Secondary | ICD-10-CM | POA: Diagnosis not present

## 2016-05-06 DIAGNOSIS — R030 Elevated blood-pressure reading, without diagnosis of hypertension: Secondary | ICD-10-CM | POA: Diagnosis not present

## 2016-05-06 DIAGNOSIS — T50905A Adverse effect of unspecified drugs, medicaments and biological substances, initial encounter: Secondary | ICD-10-CM

## 2016-05-06 NOTE — Progress Notes (Signed)
  Subjective:   Patient ID: Sheila Ray, female    DOB: 1941/04/04, 75 y.o.   MRN: [id number] CC: Seeing Urologist tommorrow (Recurrent bladder infections)  HPI: Sheila Ray is a 75 y.o. female presenting for Seeing Urologist tommorrow (Recurrent bladder infections)  HTN: doesn't check BP at home Takes lisinopril regularly Is not sure what her dose is, some discrepancy in [place] records and ours Does not have her medications with her today No HA, CP, SOB  Hives: started abx medication/keflex 1 week ago today for UTI Developed hives two days ago Itchy rash  No new medications other than the keflex  Hearing loss Noticed it mostly in L ear when talking on th ephone, has a hard time understanding voices  Relevant past medical, surgical, family and social history reviewed. Allergies and medications reviewed and updated. History  Smoking Status  . Never Smoker  Smokeless Tobacco  . Never Used   ROS: Per HPI   Objective:    BP (!) 158/80   Pulse (!) 108   Temp 99.4 F (37.4 C) (Oral)   Ht 5\' 4"  (1.626 m)   Wt 185 lb (83.9 kg)   BMI 31.76 kg/m   Wt Readings from Last 3 Encounters:  05/06/16 185 lb (83.9 kg)  05/01/16 187 lb (84.8 kg)  08/30/15 181 lb (82.1 kg)    Gen: NAD, alert, cooperative with exam, NCAT EYES: EOMI, no conjunctival injection, or no icterus CV: NRRR, normal S1/S2, no murmur Resp: CTABL, no wheezes, normal WOB Abd: +BS, soft, NTND. no guarding or organomegaly Ext: No edema, warm Neuro: Alert and oriented, strength equal b/l UE and LE, coordination grossly normal MSK: normal muscle bulk Skin: red raised plaques over neck, upper arms, some upper legs  Assessment & Plan:  Sheila Ray was seen today for seeing urologist tomorrow, multiple med problem f/u.  Diagnoses and all orders for this visit:  Adverse effect of drug, initial encounter Stop keflex Let m eknow if rash worsens or does not improve Benadryl prn  Elevated blood pressure  reading  Essential hypertension Elevated BP today. Pt think sshe takes 20mg  tabs of lisinopril at home, [facility] records say 10mg  lisinopril, most recently 40mg  tabs prescribed from our clinic. Amlodipine dose also with discrepancies in chart Check BPs at home Bring to next clinic visit If regularly high needs to be seen sooner Bring medications to next visit  Hearing loss of left ear, unspecified hearing loss type -     Ambulatory referral to Audiology   Follow up plan: Return in about 4 weeks (around 06/03/2016) for with [facility] for med follow up. Assunta Found, MD [place]

## 2016-05-06 NOTE — Patient Instructions (Signed)
Stop keflex.

## 2016-05-07 DIAGNOSIS — E279 Disorder of adrenal gland, unspecified: Secondary | ICD-10-CM | POA: Diagnosis not present

## 2016-05-07 DIAGNOSIS — I10 Essential (primary) hypertension: Secondary | ICD-10-CM | POA: Diagnosis not present

## 2016-05-07 DIAGNOSIS — N3000 Acute cystitis without hematuria: Secondary | ICD-10-CM | POA: Diagnosis not present

## 2016-05-13 DIAGNOSIS — E279 Disorder of adrenal gland, unspecified: Secondary | ICD-10-CM | POA: Diagnosis not present

## 2016-06-11 ENCOUNTER — Other Ambulatory Visit: Payer: Self-pay | Admitting: Family

## 2016-06-11 DIAGNOSIS — I10 Essential (primary) hypertension: Secondary | ICD-10-CM

## 2016-06-12 ENCOUNTER — Other Ambulatory Visit: Payer: Self-pay | Admitting: Family

## 2016-06-18 DIAGNOSIS — N3000 Acute cystitis without hematuria: Secondary | ICD-10-CM | POA: Diagnosis not present

## 2016-06-18 DIAGNOSIS — R300 Dysuria: Secondary | ICD-10-CM | POA: Diagnosis not present

## 2016-06-18 DIAGNOSIS — E279 Disorder of adrenal gland, unspecified: Secondary | ICD-10-CM | POA: Diagnosis not present

## 2016-06-27 DIAGNOSIS — N3000 Acute cystitis without hematuria: Secondary | ICD-10-CM | POA: Diagnosis not present

## 2016-06-27 DIAGNOSIS — E279 Disorder of adrenal gland, unspecified: Secondary | ICD-10-CM | POA: Diagnosis not present

## 2016-06-28 DIAGNOSIS — N281 Cyst of kidney, acquired: Secondary | ICD-10-CM | POA: Diagnosis not present

## 2016-06-28 DIAGNOSIS — E279 Disorder of adrenal gland, unspecified: Secondary | ICD-10-CM | POA: Diagnosis not present

## 2016-06-28 DIAGNOSIS — D3502 Benign neoplasm of left adrenal gland: Secondary | ICD-10-CM | POA: Diagnosis not present

## 2016-07-02 ENCOUNTER — Ambulatory Visit: Payer: Medicare Other | Attending: Pediatrics | Admitting: Audiology

## 2016-07-31 DIAGNOSIS — H00015 Hordeolum externum left lower eyelid: Secondary | ICD-10-CM | POA: Diagnosis not present

## 2016-08-08 DIAGNOSIS — E119 Type 2 diabetes mellitus without complications: Secondary | ICD-10-CM | POA: Diagnosis not present

## 2016-08-08 DIAGNOSIS — H2513 Age-related nuclear cataract, bilateral: Secondary | ICD-10-CM | POA: Diagnosis not present

## 2016-08-08 DIAGNOSIS — H0015 Chalazion left lower eyelid: Secondary | ICD-10-CM | POA: Diagnosis not present

## 2016-08-08 LAB — HM DIABETES EYE EXAM

## 2016-08-11 DIAGNOSIS — I10 Essential (primary) hypertension: Secondary | ICD-10-CM | POA: Diagnosis not present

## 2016-08-11 DIAGNOSIS — I4891 Unspecified atrial fibrillation: Secondary | ICD-10-CM | POA: Diagnosis not present

## 2016-08-11 DIAGNOSIS — Z7984 Long term (current) use of oral hypoglycemic drugs: Secondary | ICD-10-CM | POA: Diagnosis not present

## 2016-08-11 DIAGNOSIS — Z8673 Personal history of transient ischemic attack (TIA), and cerebral infarction without residual deficits: Secondary | ICD-10-CM | POA: Diagnosis not present

## 2016-08-11 DIAGNOSIS — Z881 Allergy status to other antibiotic agents status: Secondary | ICD-10-CM | POA: Diagnosis not present

## 2016-08-11 DIAGNOSIS — R5383 Other fatigue: Secondary | ICD-10-CM | POA: Diagnosis not present

## 2016-08-11 DIAGNOSIS — Z79899 Other long term (current) drug therapy: Secondary | ICD-10-CM | POA: Diagnosis not present

## 2016-08-11 DIAGNOSIS — E119 Type 2 diabetes mellitus without complications: Secondary | ICD-10-CM | POA: Diagnosis not present

## 2016-08-11 DIAGNOSIS — N3000 Acute cystitis without hematuria: Secondary | ICD-10-CM | POA: Diagnosis not present

## 2016-08-11 DIAGNOSIS — R002 Palpitations: Secondary | ICD-10-CM | POA: Diagnosis not present

## 2016-08-11 DIAGNOSIS — I482 Chronic atrial fibrillation: Secondary | ICD-10-CM | POA: Diagnosis not present

## 2016-08-11 DIAGNOSIS — Z888 Allergy status to other drugs, medicaments and biological substances status: Secondary | ICD-10-CM | POA: Diagnosis not present

## 2016-08-11 DIAGNOSIS — R0602 Shortness of breath: Secondary | ICD-10-CM | POA: Diagnosis not present

## 2016-08-11 DIAGNOSIS — I081 Rheumatic disorders of both mitral and tricuspid valves: Secondary | ICD-10-CM | POA: Diagnosis not present

## 2016-08-11 DIAGNOSIS — R000 Tachycardia, unspecified: Secondary | ICD-10-CM | POA: Diagnosis not present

## 2016-08-11 DIAGNOSIS — I517 Cardiomegaly: Secondary | ICD-10-CM | POA: Diagnosis not present

## 2016-08-11 DIAGNOSIS — I340 Nonrheumatic mitral (valve) insufficiency: Secondary | ICD-10-CM | POA: Diagnosis not present

## 2016-08-11 DIAGNOSIS — E785 Hyperlipidemia, unspecified: Secondary | ICD-10-CM | POA: Diagnosis not present

## 2016-08-12 DIAGNOSIS — I081 Rheumatic disorders of both mitral and tricuspid valves: Secondary | ICD-10-CM | POA: Diagnosis not present

## 2016-08-12 DIAGNOSIS — I4891 Unspecified atrial fibrillation: Secondary | ICD-10-CM | POA: Diagnosis not present

## 2016-08-12 DIAGNOSIS — I517 Cardiomegaly: Secondary | ICD-10-CM | POA: Diagnosis not present

## 2016-08-19 ENCOUNTER — Ambulatory Visit (INDEPENDENT_AMBULATORY_CARE_PROVIDER_SITE_OTHER): Payer: Medicare Other | Admitting: Family

## 2016-08-19 ENCOUNTER — Encounter: Payer: Self-pay | Admitting: Family

## 2016-08-19 VITALS — BP 116/72 | HR 68 | Temp 97.4°F | Ht 64.0 in | Wt 173.6 lb

## 2016-08-19 DIAGNOSIS — I10 Essential (primary) hypertension: Secondary | ICD-10-CM | POA: Diagnosis not present

## 2016-08-19 DIAGNOSIS — I4891 Unspecified atrial fibrillation: Secondary | ICD-10-CM

## 2016-08-19 DIAGNOSIS — Z09 Encounter for follow-up examination after completed treatment for conditions other than malignant neoplasm: Secondary | ICD-10-CM | POA: Diagnosis not present

## 2016-08-19 NOTE — Patient Instructions (Signed)
Atrial Fibrillation Atrial fibrillation is a type of irregular or rapid heartbeat (arrhythmia). In atrial fibrillation, the heart quivers continuously in a chaotic pattern. This occurs when parts of the heart receive disorganized signals that make the heart unable to pump blood normally. This can increase the risk for stroke, heart failure, and other heart-related conditions. There are different types of atrial fibrillation, including:  Paroxysmal atrial fibrillation. This type starts suddenly, and it usually stops on its own shortly after it starts.  Persistent atrial fibrillation. This type often lasts longer than a week. It may stop on its own or with treatment.  Long-lasting persistent atrial fibrillation. This type lasts longer than 12 months.  Permanent atrial fibrillation. This type does not go away.  Talk with your health care provider to learn about the type of atrial fibrillation that you have. What are the causes? This condition is caused by some heart-related conditions or procedures, including:  A heart attack.  Coronary artery disease.  Heart failure.  Heart valve conditions.  High blood pressure.  Inflammation of the sac that surrounds the heart (pericarditis).  Heart surgery.  Certain heart rhythm disorders, such as Wolf-Parkinson-White syndrome.  Other causes include:  Pneumonia.  Obstructive sleep apnea.  Blockage of an artery in the lungs (pulmonary embolism, or PE).  Lung cancer.  Chronic lung disease.  Thyroid problems, especially if the thyroid is overactive (hyperthyroidism).  Caffeine.  Excessive alcohol use or illegal drug use.  Use of some medicines, including certain decongestants and diet pills.  Sometimes, the cause cannot be found. What increases the risk? This condition is more likely to develop in:  People who are older in age.  People who smoke.  People who have diabetes mellitus.  People who are overweight  (obese).  Athletes who exercise vigorously.  What are the signs or symptoms? Symptoms of this condition include:  A feeling that your heart is beating rapidly or irregularly.  A feeling of discomfort or pain in your chest.  Shortness of breath.  Sudden light-headedness or weakness.  Getting tired easily during exercise.  In some cases, there are no symptoms. How is this diagnosed? Your health care provider may be able to detect atrial fibrillation when taking your pulse. If detected, this condition may be diagnosed with:  An electrocardiogram (ECG).  A Holter monitor test that records your heartbeat patterns over a 24-hour period.  Transthoracic echocardiogram (TTE) to evaluate how blood flows through your heart.  Transesophageal echocardiogram (TEE) to view more detailed images of your heart.  A stress test.  Imaging tests, such as a CT scan or chest X-ray.  Blood tests.  How is this treated? The main goals of treatment are to prevent blood clots from forming and to keep your heart beating at a normal rate and rhythm. The type of treatment that you receive depends on many factors, such as your underlying medical conditions and how you feel when you are experiencing atrial fibrillation. This condition may be treated with:  Medicine to slow down the heart rate, bring the heart's rhythm back to normal, or prevent clots from forming.  Electrical cardioversion. This is a procedure that resets your heart's rhythm by delivering a controlled, low-energy shock to the heart through your skin.  Different types of ablation, such as catheter ablation, catheter ablation with pacemaker, or surgical ablation. These procedures destroy the heart tissues that send abnormal signals. When the pacemaker is used, it is placed under your skin to help your heart beat in   a regular rhythm.  Follow these instructions at home:  Take over-the counter and prescription medicines only as told by your  health care provider.  If your health care provider prescribed a blood-thinning medicine (anticoagulant), take it exactly as told. Taking too much blood-thinning medicine can cause bleeding. If you do not take enough blood-thinning medicine, you will not have the protection that you need against stroke and other problems.  Do not use tobacco products, including cigarettes, chewing tobacco, and e-cigarettes. If you need help quitting, ask your health care provider.  If you have obstructive sleep apnea, manage your condition as told by your health care provider.  Do not drink alcohol.  Do not drink beverages that contain caffeine, such as coffee, soda, and tea.  Maintain a healthy weight. Do not use diet pills unless your health care provider approves. Diet pills may make heart problems worse.  Follow diet instructions as told by your health care provider.  Exercise regularly as told by your health care provider.  Keep all follow-up visits as told by your health care provider. This is important. How is this prevented?  Avoid drinking beverages that contain caffeine or alcohol.  Avoid certain medicines, especially medicines that are used for breathing problems.  Avoid certain herbs and herbal medicines, such as those that contain ephedra or ginseng.  Do not use illegal drugs, such as cocaine and amphetamines.  Do not smoke.  Manage your high blood pressure. Contact a health care provider if:  You notice a change in the rate, rhythm, or strength of your heartbeat.  You are taking an anticoagulant and you notice increased bruising.  You tire more easily when you exercise or exert yourself. Get help right away if:  You have chest pain, abdominal pain, sweating, or weakness.  You feel nauseous.  You notice blood in your vomit, bowel movement, or urine.  You have shortness of breath.  You suddenly have swollen feet and ankles.  You feel dizzy.  You have sudden weakness or  numbness of the face, arm, or leg, especially on one side of the body.  You have trouble speaking, trouble understanding, or both (aphasia).  Your face or your eyelid droops on one side. These symptoms may represent a serious problem that is an emergency. Do not wait to see if the symptoms will go away. Get medical help right away. Call your local emergency services (911 in the [place] Do not drive yourself to the hospital. This information is not intended to replace advice given to you by your health care provider. Make sure you discuss any questions you have with your health care provider. Document Released: 06/17/2005 Document Revised: 10/25/2015 Document Reviewed: 10/12/2014 Elsevier Interactive Patient Education  2017 Elsevier Inc.  

## 2016-08-19 NOTE — Progress Notes (Signed)
   Subjective:    Patient ID: Sheila Ray, female    DOB: 01-22-1941, 76 y.o.   MRN: [id number]  HPI PT presents to the office today for hospital follow up. Pt was admitted on 08/11/16 and discharged 08/15/16 for A Fib and UTI. PT was started on xarelto 20 mg daily and diltiazem 240 mg 24hr. PT's norvasc was d/c. PT had two cardioversion that were unsuccessful. PT has an appt with Cardiologists set up. Pt denies any chest pain, palpitations, SOB, or edema at this time. Pt was treated with cipro for 3 days and denies any dysuria at this time.   ECHO showed normal EF.   [facility] notes were reviewed.  Review of Systems  All other systems reviewed and are negative.      Objective:   Physical Exam  Constitutional: She is oriented to person, place, and time. She appears well-developed and well-nourished. No distress.  HENT:  Head: Normocephalic.  Eyes: Pupils are equal, round, and reactive to light.  Neck: Normal range of motion. Neck supple. No thyromegaly present.  Cardiovascular: Normal rate, normal heart sounds and intact distal pulses.  A regularly irregular rhythm present.  No murmur heard. Pulmonary/Chest: Effort normal and breath sounds normal. No respiratory distress. She has no wheezes.  Abdominal: Soft. Bowel sounds are normal. She exhibits no distension. There is no tenderness.  Musculoskeletal: Normal range of motion. She exhibits no edema or tenderness.  Neurological: She is alert and oriented to person, place, and time.  Skin: Skin is warm and dry.  Psychiatric: She has a normal mood and affect. Her behavior is normal. Judgment and thought content normal.  Vitals reviewed.  EKG- A Fib   BP 116/72   Pulse 68   Temp 97.4 F (36.3 C) (Oral)   Ht _0  (1.626 m)   Wt 173 lb 9.6 oz (78.7 kg)   BMI 29.80 kg/m      Assessment & Plan:  1. Essential hypertension -Resolved today, according to EPIC pt was told to continue Lisinopril. Pt states she was unsure if she  was still taking this or not. PT to told to check at home med list and to call back. - CMP14+EGFR - EKG 12-Lead  2. Atrial fibrillation, unspecified type ([place]) -Stable at this time, has Cardiologists appt Continue diltiazem daily Avoid caffeine  - CMP14+EGFR - EKG 12-Lead  3. Hospital discharge follow-up - Newborn, FNP

## 2016-08-27 ENCOUNTER — Telehealth: Payer: Self-pay | Admitting: Family

## 2016-08-27 DIAGNOSIS — R928 Other abnormal and inconclusive findings on diagnostic imaging of breast: Secondary | ICD-10-CM

## 2016-08-27 NOTE — Telephone Encounter (Signed)
Diagnostic mammogram ordered.  

## 2016-08-27 NOTE — Telephone Encounter (Signed)
Please advise 

## 2016-08-27 NOTE — Telephone Encounter (Signed)
lmtcb

## 2016-09-02 DIAGNOSIS — Z79899 Other long term (current) drug therapy: Secondary | ICD-10-CM | POA: Diagnosis not present

## 2016-09-02 DIAGNOSIS — Z7901 Long term (current) use of anticoagulants: Secondary | ICD-10-CM | POA: Diagnosis not present

## 2016-09-02 DIAGNOSIS — I481 Persistent atrial fibrillation: Secondary | ICD-10-CM | POA: Diagnosis not present

## 2016-09-02 DIAGNOSIS — I10 Essential (primary) hypertension: Secondary | ICD-10-CM | POA: Diagnosis not present

## 2016-09-04 ENCOUNTER — Telehealth: Payer: Self-pay

## 2016-09-04 NOTE — Telephone Encounter (Signed)
Letter sent with appointment date/time 

## 2016-09-07 ENCOUNTER — Other Ambulatory Visit: Payer: Self-pay | Admitting: Family

## 2016-09-08 ENCOUNTER — Other Ambulatory Visit: Payer: Self-pay | Admitting: Family Medicine

## 2016-09-08 DIAGNOSIS — E119 Type 2 diabetes mellitus without complications: Secondary | ICD-10-CM

## 2016-09-12 ENCOUNTER — Encounter: Payer: Self-pay | Admitting: Family

## 2016-09-12 DIAGNOSIS — R920 Mammographic microcalcification found on diagnostic imaging of breast: Secondary | ICD-10-CM | POA: Diagnosis not present

## 2016-09-12 DIAGNOSIS — R921 Mammographic calcification found on diagnostic imaging of breast: Secondary | ICD-10-CM | POA: Diagnosis not present

## 2016-09-13 ENCOUNTER — Other Ambulatory Visit: Payer: Self-pay | Admitting: Family

## 2016-09-20 DIAGNOSIS — I4891 Unspecified atrial fibrillation: Secondary | ICD-10-CM | POA: Diagnosis not present

## 2016-09-23 DIAGNOSIS — Z87440 Personal history of urinary (tract) infections: Secondary | ICD-10-CM | POA: Diagnosis not present

## 2016-09-23 DIAGNOSIS — E279 Disorder of adrenal gland, unspecified: Secondary | ICD-10-CM | POA: Diagnosis not present

## 2016-09-23 DIAGNOSIS — I10 Essential (primary) hypertension: Secondary | ICD-10-CM | POA: Diagnosis not present

## 2016-10-08 ENCOUNTER — Ambulatory Visit: Payer: Medicare Other | Admitting: Family

## 2016-10-09 ENCOUNTER — Encounter: Payer: Self-pay | Admitting: Family

## 2016-10-09 ENCOUNTER — Telehealth: Payer: Self-pay | Admitting: Family

## 2016-10-09 NOTE — Telephone Encounter (Signed)
Returned patient call regarding her visit to our office yesterday.  Wanted to be seen for a possible tick on her back.  Given appt for 1:30 yesterday.  Patient was upset because no one took time to look at her back and see if she had a tick and felt like no one would help her.  She left here and went to a doctor office in [facility] who was able to see her sooner and she went home and fell asleep in her recliner forgetting to call and cancel her appt with [place]. I apologized for her inconvenience.

## 2016-10-09 NOTE — Telephone Encounter (Signed)
I called pt and apologize to patient that we were not able to see her sooner. PT states she will not be back to our office.

## 2016-10-09 NOTE — Telephone Encounter (Signed)
Left message for patient to call and reschedule appointment.

## 2016-10-10 DIAGNOSIS — I481 Persistent atrial fibrillation: Secondary | ICD-10-CM | POA: Diagnosis not present

## 2016-10-10 DIAGNOSIS — I10 Essential (primary) hypertension: Secondary | ICD-10-CM | POA: Diagnosis not present

## 2016-11-04 ENCOUNTER — Other Ambulatory Visit: Payer: Self-pay | Admitting: Family

## 2016-11-19 DIAGNOSIS — E1169 Type 2 diabetes mellitus with other specified complication: Secondary | ICD-10-CM | POA: Diagnosis not present

## 2016-11-19 DIAGNOSIS — E785 Hyperlipidemia, unspecified: Secondary | ICD-10-CM | POA: Diagnosis not present

## 2016-11-19 DIAGNOSIS — I10 Essential (primary) hypertension: Secondary | ICD-10-CM | POA: Diagnosis not present

## 2016-11-19 DIAGNOSIS — I481 Persistent atrial fibrillation: Secondary | ICD-10-CM | POA: Diagnosis not present

## 2016-11-19 DIAGNOSIS — Z8673 Personal history of transient ischemic attack (TIA), and cerebral infarction without residual deficits: Secondary | ICD-10-CM | POA: Diagnosis not present

## 2016-11-21 DIAGNOSIS — M79671 Pain in right foot: Secondary | ICD-10-CM | POA: Diagnosis not present

## 2016-11-21 DIAGNOSIS — M79672 Pain in left foot: Secondary | ICD-10-CM | POA: Diagnosis not present

## 2016-11-21 DIAGNOSIS — Q828 Other specified congenital malformations of skin: Secondary | ICD-10-CM | POA: Diagnosis not present

## 2016-12-07 ENCOUNTER — Other Ambulatory Visit: Payer: Self-pay | Admitting: Family

## 2016-12-07 DIAGNOSIS — E119 Type 2 diabetes mellitus without complications: Secondary | ICD-10-CM

## 2016-12-12 ENCOUNTER — Other Ambulatory Visit: Payer: Self-pay | Admitting: Family

## 2016-12-30 ENCOUNTER — Other Ambulatory Visit: Payer: Self-pay | Admitting: Family

## 2016-12-30 NOTE — Telephone Encounter (Signed)
What is the name of the medication? Metformin   Have you contacted your pharmacy to request a refill? YES  Which pharmacy would you like this sent to? [facility] [place] [facility]   Patient notified that their request is being sent to the clinical staff for review and that they should receive a call once it is complete. If they do not receive a call within 24 hours they can check with their pharmacy or our office.

## 2016-12-30 NOTE — Telephone Encounter (Signed)
Left detailed message- rx requested was filled last month for a 3 month supply.

## 2017-01-03 ENCOUNTER — Ambulatory Visit: Payer: Medicare Other

## 2017-01-03 ENCOUNTER — Ambulatory Visit (INDEPENDENT_AMBULATORY_CARE_PROVIDER_SITE_OTHER): Payer: Medicare Other | Admitting: *Deleted

## 2017-01-03 VITALS — BP 115/74 | HR 63 | Ht 64.0 in | Wt 180.0 lb

## 2017-01-03 DIAGNOSIS — Z0000 Encounter for general adult medical examination without abnormal findings: Secondary | ICD-10-CM | POA: Diagnosis not present

## 2017-01-03 NOTE — Patient Instructions (Addendum)
  Sheila Ray , Thank you for taking time to come for your Medicare Wellness Visit. I appreciate your ongoing commitment to your health goals. Please review the following plan we discussed and let me know if I can assist you in the future.   These are the goals we discussed: Goals    . Exercise 3x per week (30 min per time)          Walk for at least 30 minutes 3 times per week      If unable to walk due to A-fib, do chair exercises daily. See handout.  Review Advance Directives and bring a signed notarized copy to our office.  Have Dexa scan at your next visit with [name], [place] in August 2018.  Schedule Mammogram in September 2018.   This is a list of the screening recommended for you and due dates:  Health Maintenance  Topic Date Due  . Tetanus Vaccine  09/13/2012  . Hemoglobin A1C  11/16/2015  . Complete foot exam   01/04/2016  . Mammogram  09/19/2016  . Flu Shot  01/29/2017  . Eye exam for diabetics  08/08/2017  . DEXA scan (bone density measurement)  Completed  . Pneumonia vaccines  Completed

## 2017-01-06 NOTE — Progress Notes (Signed)
Subjective:   Sheila Ray is a 76 y.o. female who presents for an Initial Medicare Annual Wellness Visit. Ms Birchler is divorced and lives in a condo alone. She has one son and one grandson. 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 She has now retired and plans on travelling. She enjoys reading.   Review of Systems    Reports that her health is about the same as last year.   Cardiac Risk Factors include: dyslipidemia;sedentary lifestyle;obesity (BMI >30kg/m2);hypertension;diabetes mellitus;advanced age (>57men, >101 women)   Musculoskeletal: some knee pain  Other systems negative.     Objective:    Today's Vitals   01/03/17 0838  BP: 115/74  Pulse: 63  Weight: 180 lb (81.6 kg)  Height: 5\' 4"  (1.626 m)   Body mass index is 30.9 kg/m.   Current Medications (verified) Outpatient Encounter Prescriptions as of 01/03/2017  Medication Sig  . alendronate (FOSAMAX) 70 MG tablet TAKE 1 TABLET WEEKLY AS DIRECTED  . diltiazem (CARDIZEM CD) 240 MG 24 hr capsule TAKE ONE CAPSULE BY MOUTH EVERY DAY  . glucose blood (ONE TOUCH ULTRA TEST) test strip USE TO CHECK BLOOD GLUCOSE ONCE A DAY AS INSTRUCTED  . lisinopril (PRINIVIL,ZESTRIL) 40 MG tablet TAKE 1 TABLET (40 MG TOTAL) BY MOUTH DAILY.  . magnesium oxide (MAG-OX) 400 (241.3 Mg) MG tablet TAKE 1 TABLET BY MOUTH EVERY DAY  . metFORMIN (GLUCOPHAGE) 1000 MG tablet TAKE ONE TABLET BY MOUTH WITH BREAKSFAST  . metoprolol succinate (TOPROL-XL) 25 MG 24 hr tablet Take 25 mg by mouth at bedtime.  Glory Rosebush DELICA LANCETS 81W MISC 1 each by Does not apply route daily. Use to check BG daily Dx:  250.02  . Vitamin D, Ergocalciferol, (DRISDOL) 50000 units CAPS capsule TAKE 1 CAPSULE (50,000 UNITS TOTAL) BY MOUTH EVERY 7 (SEVEN) DAYS.  Marland Kitchen XARELTO 20 MG TABS tablet TAKE ONE TABLET (20 MG TOTAL) BY MOUTH DAILY.   No facility-administered encounter medications on file as of 01/03/2017.     Allergies (verified) Crestor [rosuvastatin];  Lipitor [atorvastatin]; Pravastatin; and Keflex [cephalexin]   History: Past Medical History:  Diagnosis Date  . Arthritis   . Complication of anesthesia   . Diabetes mellitus without complication ([place])   . Diverticulosis   . Fatigue   . Gastric polyp   . Goiter   . Hyperlipidemia   . Hypertension   . Hypothyroid    taken off of thyroid medication 2012014   . Obese   . Osteopenia   . PONV (postoperative nausea and vomiting)   . Postmenopausal   . Rosacea   . 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 Vitamin D deficiency    Past Surgical History:  Procedure Laterality Date  . ABDOMINAL HYSTERECTOMY  1985  . FOOT SURGERY Right 08/2002  . right knee arthroscopy   2013   . TONSILLECTOMY    . 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 Osteoporosis Mother   . Alzheimer's disease Mother 32  . Arthritis Mother   . Cancer Father        Lung  . Arthritis Sister   . Obesity Sister   . Heart attack Brother 31  . Heart disease Son   . Arthritis Brother   . Arthritis  Sister   . Cancer Sister        breast cancer   Social History   Occupational History  . 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 Smoking status: Never Smoker  . Smokeless tobacco: Never Used  . Alcohol use No  . Drug use: No  . Sexual activity: No    Tobacco Counseling No tobacco use  Activities of Daily Living In your present state of health, do you have any difficulty performing the following activities: 01/03/2017  Hearing? N  Vision? N  Difficulty concentrating or making decisions? N  Walking or climbing stairs? N  Dressing or bathing? N  Doing errands, shopping? N  Preparing Food and eating ? N  Using the Toilet? N  In the past six months,  have you accidently leaked urine? Y  Do you have problems with loss of bowel control? N  Managing your Medications? N  Managing your Finances? N  Housekeeping or managing your Housekeeping? N  Some recent data might be hidden    Immunizations and Health Maintenance Immunization History  Administered Date(s) Administered  . Influenza Whole 05/16/2012  . Influenza,inj,Quad PF,36+ Mos 03/01/2013, 04/06/2014, 05/19/2015  . Pneumococcal Conjugate-13 12/06/2014  . Pneumococcal Polysaccharide-23 11/29/2005  . Td 09/14/2002  . Zoster 04/17/2011   Health Maintenance Due  Topic Date Due  . TETANUS/TDAP  09/13/2012  . HEMOGLOBIN A1C  11/16/2015  . FOOT EXAM  01/04/2016  . MAMMOGRAM  09/19/2016    Patient Care Team: Sharion Balloon, FNP as PCP - General (Family Medicine) Gaynelle Arabian, MD as Consulting Physician (Orthopedic Surgery) Clent Jacks, MD as Consulting Physician (Ophthalmology) Su Ley as Physician Assistant (Physician Assistant) Heriberto Antigua, MD as Referring Physician (Urology) Trula Slade, MD as Referring Physician (Cardiology)  No hospitalizations, ER Visits, or surgeries this past year.     Assessment:   This is a routine wellness examination for [place].  Hearing/Vision screen No hearing or vision deficits noted during visit. Last eye exam was 08/2016.  Dietary issues and exercise activities discussed: Current Exercise Habits: Home exercise routine, Type of exercise: walking, Time (Minutes): 20, Frequency (Times/Week): 2, Weekly Exercise (Minutes/Week): 40, Intensity: Mild, Exercise limited by: orthopedic condition(s);cardiac condition(s)   Diet: Eats 3 meals a day and usually a snack of fruit before bed.  Goals    . Exercise 3x per week (30 min per time)          Walk for at least 30 minutes 3 times per week      Depression Screen PHQ 2/9 Scores 01/03/2017 08/19/2016 05/06/2016 05/01/2016 05/19/2015 01/04/2015 12/06/2014  PHQ - 2 Score 0 0 0 0  0 0 0    Fall Risk Fall Risk  01/03/2017 08/19/2016 05/06/2016 05/01/2016 05/19/2015  Falls in the past year? No Yes No No No  Number falls in past yr: - 1 - - -  Injury with Fall? - No - - -    Cognitive Function: MMSE - Mini Mental State Exam 01/03/2017 01/03/2017 12/06/2014  Orientation to time 5 5 5   Orientation to Place 5 5 5   Registration 3 3 3   Attention/ Calculation 2 2 5   Recall 2 3 3   Language- name 2 objects 2 2 2   Language- repeat 0 0 1  Language- follow 3 step command 3 3 3   Language- read & follow direction 1 1 1   Write a sentence  1 1 1   Copy design 1 1 1   Total score 25 26 30   Difficulty with Attention and Calculation and Repetition. Will have provider review.     Screening Tests Health Maintenance  Topic Date Due  . TETANUS/TDAP  09/13/2012  . HEMOGLOBIN A1C  11/16/2015  . FOOT EXAM  01/04/2016  . MAMMOGRAM  09/19/2016  . INFLUENZA VACCINE  01/29/2017  . OPHTHALMOLOGY EXAM  08/08/2017  . DEXA SCAN  Completed  . PNA vac Low Risk Adult  Completed      Plan:  -F/u diagnostic mammo due 03/2017. She is on the recall list for [facility].  -Dexa due 05/2017 -Appt scheduled with [name], FNP for 02/25/17 -Review Advance Directives. Bring a signed notarized copy to our office for your record.   I have personally reviewed and noted the following in the patient's chart:   . Medical and social history . Use of alcohol, tobacco or illicit drugs  . Current medications and supplements . Functional ability and status . Nutritional status . Physical activity . Advanced directives . List of other physicians . Hospitalizations, surgeries, and ER visits in previous 12 months . Vitals . Screenings to include cognitive, depression, and falls . Referrals and appointments  In addition, I have reviewed and discussed with patient certain preventive protocols, quality metrics, and best practice recommendations. A written personalized care plan for preventive services as well  as general preventive health recommendations were provided to patient.     Chong Sicilian, RN   01/06/2017     I have reviewed and agree with the above AWV documentation.   [name], FNP

## 2017-01-08 ENCOUNTER — Other Ambulatory Visit: Payer: Self-pay | Admitting: Family

## 2017-02-04 ENCOUNTER — Other Ambulatory Visit: Payer: Self-pay | Admitting: Family

## 2017-02-06 DIAGNOSIS — M5416 Radiculopathy, lumbar region: Secondary | ICD-10-CM | POA: Diagnosis not present

## 2017-02-06 DIAGNOSIS — M545 Low back pain: Secondary | ICD-10-CM | POA: Diagnosis not present

## 2017-02-06 DIAGNOSIS — M5136 Other intervertebral disc degeneration, lumbar region: Secondary | ICD-10-CM | POA: Diagnosis not present

## 2017-02-07 ENCOUNTER — Other Ambulatory Visit: Payer: Self-pay | Admitting: Family

## 2017-02-10 NOTE — Telephone Encounter (Signed)
Last seen 2./19/18  [facility]

## 2017-02-18 ENCOUNTER — Telehealth: Payer: Self-pay

## 2017-02-25 ENCOUNTER — Encounter: Payer: Self-pay | Admitting: Family

## 2017-02-25 ENCOUNTER — Ambulatory Visit (INDEPENDENT_AMBULATORY_CARE_PROVIDER_SITE_OTHER): Payer: Medicare Other | Admitting: Family

## 2017-02-25 VITALS — BP 134/83 | HR 82 | Temp 97.8°F | Ht 64.0 in | Wt 178.0 lb

## 2017-02-25 DIAGNOSIS — E669 Obesity, unspecified: Secondary | ICD-10-CM | POA: Diagnosis not present

## 2017-02-25 DIAGNOSIS — M1711 Unilateral primary osteoarthritis, right knee: Secondary | ICD-10-CM

## 2017-02-25 DIAGNOSIS — E119 Type 2 diabetes mellitus without complications: Secondary | ICD-10-CM

## 2017-02-25 DIAGNOSIS — E785 Hyperlipidemia, unspecified: Secondary | ICD-10-CM

## 2017-02-25 DIAGNOSIS — E039 Hypothyroidism, unspecified: Secondary | ICD-10-CM

## 2017-02-25 DIAGNOSIS — M810 Age-related osteoporosis without current pathological fracture: Secondary | ICD-10-CM | POA: Diagnosis not present

## 2017-02-25 DIAGNOSIS — E559 Vitamin D deficiency, unspecified: Secondary | ICD-10-CM | POA: Diagnosis not present

## 2017-02-25 DIAGNOSIS — I10 Essential (primary) hypertension: Secondary | ICD-10-CM

## 2017-02-25 DIAGNOSIS — I4891 Unspecified atrial fibrillation: Secondary | ICD-10-CM | POA: Diagnosis not present

## 2017-02-25 LAB — BAYER DCA HB A1C WAIVED: HB A1C (BAYER DCA - WAIVED): 6.3 % (ref <7.0)

## 2017-02-25 NOTE — Patient Instructions (Signed)
Atrial Fibrillation Atrial fibrillation is a type of irregular or rapid heartbeat (arrhythmia). In atrial fibrillation, the heart quivers continuously in a chaotic pattern. This occurs when parts of the heart receive disorganized signals that make the heart unable to pump blood normally. This can increase the risk for stroke, heart failure, and other heart-related conditions. There are different types of atrial fibrillation, including:  Paroxysmal atrial fibrillation. This type starts suddenly, and it usually stops on its own shortly after it starts.  Persistent atrial fibrillation. This type often lasts longer than a week. It may stop on its own or with treatment.  Long-lasting persistent atrial fibrillation. This type lasts longer than 12 months.  Permanent atrial fibrillation. This type does not go away.  Talk with your health care provider to learn about the type of atrial fibrillation that you have. What are the causes? This condition is caused by some heart-related conditions or procedures, including:  A heart attack.  Coronary artery disease.  Heart failure.  Heart valve conditions.  High blood pressure.  Inflammation of the sac that surrounds the heart (pericarditis).  Heart surgery.  Certain heart rhythm disorders, such as Wolf-Parkinson-White syndrome.  Other causes include:  Pneumonia.  Obstructive sleep apnea.  Blockage of an artery in the lungs (pulmonary embolism, or PE).  Lung cancer.  Chronic lung disease.  Thyroid problems, especially if the thyroid is overactive (hyperthyroidism).  Caffeine.  Excessive alcohol use or illegal drug use.  Use of some medicines, including certain decongestants and diet pills.  Sometimes, the cause cannot be found. What increases the risk? This condition is more likely to develop in:  People who are older in age.  People who smoke.  People who have diabetes mellitus.  People who are overweight  (obese).  Athletes who exercise vigorously.  What are the signs or symptoms? Symptoms of this condition include:  A feeling that your heart is beating rapidly or irregularly.  A feeling of discomfort or pain in your chest.  Shortness of breath.  Sudden light-headedness or weakness.  Getting tired easily during exercise.  In some cases, there are no symptoms. How is this diagnosed? Your health care provider may be able to detect atrial fibrillation when taking your pulse. If detected, this condition may be diagnosed with:  An electrocardiogram (ECG).  A Holter monitor test that records your heartbeat patterns over a 24-hour period.  Transthoracic echocardiogram (TTE) to evaluate how blood flows through your heart.  Transesophageal echocardiogram (TEE) to view more detailed images of your heart.  A stress test.  Imaging tests, such as a CT scan or chest X-ray.  Blood tests.  How is this treated? The main goals of treatment are to prevent blood clots from forming and to keep your heart beating at a normal rate and rhythm. The type of treatment that you receive depends on many factors, such as your underlying medical conditions and how you feel when you are experiencing atrial fibrillation. This condition may be treated with:  Medicine to slow down the heart rate, bring the heart's rhythm back to normal, or prevent clots from forming.  Electrical cardioversion. This is a procedure that resets your heart's rhythm by delivering a controlled, low-energy shock to the heart through your skin.  Different types of ablation, such as catheter ablation, catheter ablation with pacemaker, or surgical ablation. These procedures destroy the heart tissues that send abnormal signals. When the pacemaker is used, it is placed under your skin to help your heart beat in   a regular rhythm.  Follow these instructions at home:  Take over-the counter and prescription medicines only as told by your  health care provider.  If your health care provider prescribed a blood-thinning medicine (anticoagulant), take it exactly as told. Taking too much blood-thinning medicine can cause bleeding. If you do not take enough blood-thinning medicine, you will not have the protection that you need against stroke and other problems.  Do not use tobacco products, including cigarettes, chewing tobacco, and e-cigarettes. If you need help quitting, ask your health care provider.  If you have obstructive sleep apnea, manage your condition as told by your health care provider.  Do not drink alcohol.  Do not drink beverages that contain caffeine, such as coffee, soda, and tea.  Maintain a healthy weight. Do not use diet pills unless your health care provider approves. Diet pills may make heart problems worse.  Follow diet instructions as told by your health care provider.  Exercise regularly as told by your health care provider.  Keep all follow-up visits as told by your health care provider. This is important. How is this prevented?  Avoid drinking beverages that contain caffeine or alcohol.  Avoid certain medicines, especially medicines that are used for breathing problems.  Avoid certain herbs and herbal medicines, such as those that contain ephedra or ginseng.  Do not use illegal drugs, such as cocaine and amphetamines.  Do not smoke.  Manage your high blood pressure. Contact a health care provider if:  You notice a change in the rate, rhythm, or strength of your heartbeat.  You are taking an anticoagulant and you notice increased bruising.  You tire more easily when you exercise or exert yourself. Get help right away if:  You have chest pain, abdominal pain, sweating, or weakness.  You feel nauseous.  You notice blood in your vomit, bowel movement, or urine.  You have shortness of breath.  You suddenly have swollen feet and ankles.  You feel dizzy.  You have sudden weakness or  numbness of the face, arm, or leg, especially on one side of the body.  You have trouble speaking, trouble understanding, or both (aphasia).  Your face or your eyelid droops on one side. These symptoms may represent a serious problem that is an emergency. Do not wait to see if the symptoms will go away. Get medical help right away. Call your local emergency services (911 in the [place] Do not drive yourself to the hospital. This information is not intended to replace advice given to you by your health care provider. Make sure you discuss any questions you have with your health care provider. Document Released: 06/17/2005 Document Revised: 10/25/2015 Document Reviewed: 10/12/2014 Elsevier Interactive Patient Education  2017 Elsevier Inc.  

## 2017-02-25 NOTE — Progress Notes (Signed)
Subjective:    Patient ID: Sheila Ray, female    DOB: 10/11/1940, 76 y.o.   MRN: [id number]  Pt presents to the office today for chronic follow up. Pt is followed by Cardiologists every 3 months for A Fib.  Hypertension  This is a chronic problem. The current episode started more than 1 year ago. The problem has been resolved since onset. The problem is controlled. Pertinent negatives include no blurred vision, malaise/fatigue, peripheral edema or shortness of breath. Risk factors for coronary artery disease include dyslipidemia, diabetes mellitus, obesity, post-menopausal state and sedentary lifestyle. The current treatment provides moderate improvement. Hypertensive end-organ damage includes CVA. There is no history of heart failure. Identifiable causes of hypertension include a thyroid problem.  Thyroid Problem  Presents for follow-up visit. Symptoms include constipation. Patient reports no dry skin, fatigue, leg swelling or visual change. The symptoms have been stable. Her past medical history is significant for hyperlipidemia. There is no history of heart failure.  Diabetes  She presents for her follow-up diabetic visit. She has type 2 diabetes mellitus. There are no hypoglycemic associated symptoms. There are no diabetic associated symptoms. Pertinent negatives for diabetes include no blurred vision, no fatigue, no foot paresthesias and no visual change. There are no hypoglycemic complications. Diabetic complications include a CVA. Pertinent negatives for diabetic complications include no nephropathy or peripheral neuropathy. Her breakfast blood glucose range is generally 130-140 mg/dl. Eye exam is current.  Hyperlipidemia  This is a chronic problem. The current episode started more than 1 year ago. The problem is uncontrolled. Recent lipid tests were reviewed and are high. Exacerbating diseases include obesity. Pertinent negatives include no shortness of breath. She is currently on no  antihyperlipidemic treatment. The current treatment provides mild improvement of lipids.  Arthritis  Presents for follow-up visit. She complains of pain and stiffness. The symptoms have been stable. Affected locations include the right knee. Her pain is at a severity of 1/10. Pertinent negatives include no fatigue.  Osteoporosis  Pt taking Fosamax weekly. Last Dexa scan 12/06/14.   Review of Systems  Constitutional: Negative for fatigue and malaise/fatigue.  Eyes: Negative for blurred vision.  Respiratory: Negative for shortness of breath.   Gastrointestinal: Positive for constipation.  Musculoskeletal: Positive for arthritis and stiffness.  All other systems reviewed and are negative.      Objective:   Physical Exam  Constitutional: She is oriented to person, place, and time. She appears well-developed and well-nourished. No distress.  HENT:  Head: Normocephalic and atraumatic.  Right Ear: External ear normal.  Left Ear: External ear normal.  Nose: Nose normal.  Mouth/Throat: Oropharynx is clear and moist.  Eyes: Pupils are equal, round, and reactive to light.  Neck: Normal range of motion. Neck supple. No thyromegaly present.  Cardiovascular: Normal rate, normal heart sounds and intact distal pulses.  An irregular rhythm present.  No murmur heard. Pulmonary/Chest: Effort normal and breath sounds normal. No respiratory distress. She has no wheezes.  Abdominal: Soft. Bowel sounds are normal. She exhibits no distension. There is no tenderness.  Musculoskeletal: Normal range of motion. She exhibits no edema or tenderness.  Neurological: She is alert and oriented to person, place, and time.  Skin: Skin is warm and dry.  Psychiatric: She has a normal mood and affect. Her behavior is normal. Judgment and thought content normal.  Vitals reviewed.   Diabetic Foot Exam - Simple   Simple Foot Form Diabetic Foot exam was performed with the following findings:  Yes  02/25/2017  8:38 AM    Visual Inspection No deformities, no ulcerations, no other skin breakdown bilaterally:  Yes Sensation Testing Intact to touch and monofilament testing bilaterally:  Yes Pulse Check Posterior Tibialis and Dorsalis pulse intact bilaterally:  Yes Comments      BP 134/83   Pulse 82   Temp 97.8 F (36.6 C) (Oral)   Ht _0  (1.626 m)   Wt 178 lb (80.7 kg)   BMI 30.55 kg/m      Assessment & Plan:  1. Essential hypertension - CMP14+EGFR  2. Atrial fibrillation, unspecified type ([place]) - CMP14+EGFR  3. Hyperlipidemia, unspecified hyperlipidemia type - CMP14+EGFR - Lipid panel  4. Obesity (BMI 30-39.9)  - CMP14+EGFR  5. Vitamin D deficiency - CMP14+EGFR  6. Primary osteoarthritis of right knee - CMP14+EGFR  7. Hypothyroidism, unspecified type  - CMP14+EGFR - TSH  8. Type 2 diabetes mellitus without complication, without long-term current use of insulin (HCC) - CMP14+EGFR - Bayer DCA Hb A1c Waived - Microalbumin / creatinine urine ratio  9. Osteoporosis, unspecified osteoporosis type, unspecified pathological fracture presence - CMP14+EGFR   Continue all meds Labs pending Health Maintenance reviewed Diet and exercise encouraged RTO 4 months   [name], FNP

## 2017-02-26 LAB — CMP14+EGFR
ALT: 10 IU/L (ref 0–32)
AST: 14 IU/L (ref 0–40)
Albumin/Globulin Ratio: 1.6 (ref 1.2–2.2)
Albumin: 4.2 g/dL (ref 3.5–4.8)
Alkaline Phosphatase: 56 IU/L (ref 39–117)
BUN/Creatinine Ratio: 16 (ref 12–28)
BUN: 14 mg/dL (ref 8–27)
Bilirubin Total: <0.2 mg/dL (ref 0.0–1.2)
CO2: 19 mmol/L — ABNORMAL LOW (ref 20–29)
Calcium: 9.8 mg/dL (ref 8.7–10.3)
Chloride: 101 mmol/L (ref 96–106)
Creatinine, Ser: 0.9 mg/dL (ref 0.57–1.00)
GFR calc Af Amer: 72 mL/min/{1.73_m2} (ref >59)
GFR calc non Af Amer: 62 mL/min/{1.73_m2} (ref >59)
Globulin, Total: 2.6 g/dL (ref 1.5–4.5)
Glucose: 124 mg/dL — ABNORMAL HIGH (ref 65–99)
Potassium: 4.4 mmol/L (ref 3.5–5.2)
Sodium: 137 mmol/L (ref 134–144)
Total Protein: 6.8 g/dL (ref 6.0–8.5)

## 2017-02-26 LAB — MICROALBUMIN / CREATININE URINE RATIO
Creatinine, Urine: 130.5 mg/dL
Microalb/Creat Ratio: 5.3 mg/g creat (ref 0.0–30.0)
Microalbumin, Urine: 6.9 ug/mL

## 2017-02-26 LAB — LIPID PANEL
Chol/HDL Ratio: 4.4 ratio (ref 0.0–4.4)
Cholesterol, Total: 224 mg/dL — ABNORMAL HIGH (ref 100–199)
HDL: 51 mg/dL (ref >39)
LDL Calculated: 141 mg/dL — ABNORMAL HIGH (ref 0–99)
Triglycerides: 161 mg/dL — ABNORMAL HIGH (ref 0–149)
VLDL Cholesterol Cal: 32 mg/dL (ref 5–40)

## 2017-02-26 LAB — TSH: TSH: 1.92 u[IU]/mL (ref 0.450–4.500)

## 2017-03-02 ENCOUNTER — Other Ambulatory Visit: Payer: Self-pay | Admitting: Family

## 2017-03-05 ENCOUNTER — Other Ambulatory Visit: Payer: Self-pay | Admitting: Family

## 2017-03-10 ENCOUNTER — Telehealth: Payer: Self-pay | Admitting: Family

## 2017-03-10 NOTE — Telephone Encounter (Signed)
Patient aware of results.

## 2017-03-14 ENCOUNTER — Other Ambulatory Visit: Payer: Self-pay | Admitting: Family

## 2017-03-14 ENCOUNTER — Other Ambulatory Visit: Payer: Self-pay

## 2017-03-14 DIAGNOSIS — M810 Age-related osteoporosis without current pathological fracture: Secondary | ICD-10-CM

## 2017-03-14 DIAGNOSIS — E119 Type 2 diabetes mellitus without complications: Secondary | ICD-10-CM

## 2017-03-14 MED ORDER — ALENDRONATE SODIUM 70 MG PO TABS: ORAL_TABLET | ORAL | 0 refills | Status: DC

## 2017-03-17 DIAGNOSIS — R928 Other abnormal and inconclusive findings on diagnostic imaging of breast: Secondary | ICD-10-CM | POA: Diagnosis not present

## 2017-03-17 DIAGNOSIS — R920 Mammographic microcalcification found on diagnostic imaging of breast: Secondary | ICD-10-CM | POA: Diagnosis not present

## 2017-03-17 DIAGNOSIS — R922 Inconclusive mammogram: Secondary | ICD-10-CM | POA: Diagnosis not present

## 2017-03-17 DIAGNOSIS — R921 Mammographic calcification found on diagnostic imaging of breast: Secondary | ICD-10-CM | POA: Diagnosis not present

## 2017-04-04 ENCOUNTER — Other Ambulatory Visit: Payer: Self-pay | Admitting: Family

## 2017-04-04 DIAGNOSIS — E119 Type 2 diabetes mellitus without complications: Secondary | ICD-10-CM

## 2017-04-04 MED ORDER — METFORMIN HCL 1000 MG PO TABS: ORAL_TABLET | ORAL | 0 refills | Status: DC

## 2017-04-04 NOTE — Telephone Encounter (Signed)
Rx sent in patient aware 

## 2017-05-23 ENCOUNTER — Other Ambulatory Visit: Payer: Self-pay | Admitting: Family

## 2017-05-23 DIAGNOSIS — E119 Type 2 diabetes mellitus without complications: Secondary | ICD-10-CM

## 2017-05-26 DIAGNOSIS — E785 Hyperlipidemia, unspecified: Secondary | ICD-10-CM | POA: Diagnosis not present

## 2017-05-26 DIAGNOSIS — Z8673 Personal history of transient ischemic attack (TIA), and cerebral infarction without residual deficits: Secondary | ICD-10-CM | POA: Diagnosis not present

## 2017-05-26 DIAGNOSIS — I10 Essential (primary) hypertension: Secondary | ICD-10-CM | POA: Diagnosis not present

## 2017-05-26 DIAGNOSIS — Z7901 Long term (current) use of anticoagulants: Secondary | ICD-10-CM | POA: Diagnosis not present

## 2017-05-26 DIAGNOSIS — E1169 Type 2 diabetes mellitus with other specified complication: Secondary | ICD-10-CM | POA: Diagnosis not present

## 2017-05-26 DIAGNOSIS — I481 Persistent atrial fibrillation: Secondary | ICD-10-CM | POA: Diagnosis not present

## 2017-06-03 ENCOUNTER — Other Ambulatory Visit: Payer: Self-pay | Admitting: Family

## 2017-07-22 ENCOUNTER — Other Ambulatory Visit: Payer: Self-pay | Admitting: *Deleted

## 2017-07-22 ENCOUNTER — Telehealth: Payer: Self-pay | Admitting: Family

## 2017-07-22 DIAGNOSIS — E119 Type 2 diabetes mellitus without complications: Secondary | ICD-10-CM

## 2017-07-22 MED ORDER — GLUCOSE BLOOD VI STRP
ORAL_STRIP | 0 refills | Status: DC
Start: 2017-07-22 — End: 2017-09-12

## 2017-07-24 ENCOUNTER — Encounter: Payer: Self-pay | Admitting: Family

## 2017-07-24 ENCOUNTER — Ambulatory Visit (INDEPENDENT_AMBULATORY_CARE_PROVIDER_SITE_OTHER): Payer: Medicare Other | Admitting: Family

## 2017-07-24 VITALS — BP 116/80 | HR 92 | Temp 97.1°F | Ht 64.0 in | Wt 178.8 lb

## 2017-07-24 DIAGNOSIS — I10 Essential (primary) hypertension: Secondary | ICD-10-CM | POA: Diagnosis not present

## 2017-07-24 DIAGNOSIS — E1169 Type 2 diabetes mellitus with other specified complication: Secondary | ICD-10-CM

## 2017-07-24 DIAGNOSIS — E039 Hypothyroidism, unspecified: Secondary | ICD-10-CM

## 2017-07-24 DIAGNOSIS — E1159 Type 2 diabetes mellitus with other circulatory complications: Secondary | ICD-10-CM | POA: Diagnosis not present

## 2017-07-24 DIAGNOSIS — E119 Type 2 diabetes mellitus without complications: Secondary | ICD-10-CM

## 2017-07-24 DIAGNOSIS — I4891 Unspecified atrial fibrillation: Secondary | ICD-10-CM | POA: Diagnosis not present

## 2017-07-24 DIAGNOSIS — E785 Hyperlipidemia, unspecified: Secondary | ICD-10-CM

## 2017-07-24 DIAGNOSIS — E669 Obesity, unspecified: Secondary | ICD-10-CM | POA: Diagnosis not present

## 2017-07-24 DIAGNOSIS — I152 Hypertension secondary to endocrine disorders: Secondary | ICD-10-CM

## 2017-07-24 NOTE — Progress Notes (Signed)
Subjective:    Patient ID: Sheila Ray, female    DOB: 09/29/1940, 77 y.o.   MRN: [id number]  Pt presents to the office today for chronic follow up. Pt is followed by Cardiologists every 6 months for A Fib. Pt states this is doing well.   Hypertension  This is a chronic problem. The current episode started more than 1 year ago. The problem has been resolved since onset. The problem is controlled. Pertinent negatives include no peripheral edema or shortness of breath. Past treatments include ACE inhibitors. The current treatment provides moderate improvement. There is no history of CVA. Identifiable causes of hypertension include a thyroid problem.  Diabetes  She presents for her follow-up diabetic visit. She has type 2 diabetes mellitus. Her disease course has been stable. There are no hypoglycemic associated symptoms. There are no diabetic associated symptoms. Pertinent negatives for diabetes include no fatigue. There are no hypoglycemic complications. Symptoms are stable. Pertinent negatives for diabetic complications include no CVA, nephropathy or peripheral neuropathy. Risk factors for coronary artery disease include diabetes mellitus, dyslipidemia, obesity, sedentary lifestyle and post-menopausal. She is following a generally healthy diet. Her breakfast blood glucose range is generally 140-180 mg/dl.  Hyperlipidemia  This is a chronic problem. The current episode started more than 1 year ago. The problem is uncontrolled. Recent lipid tests were reviewed and are high. Exacerbating diseases include obesity. Pertinent negatives include no shortness of breath. Current antihyperlipidemic treatment includes diet change. The current treatment provides moderate improvement of lipids. Risk factors for coronary artery disease include dyslipidemia, family history, diabetes mellitus, hypertension, obesity, post-menopausal and a sedentary lifestyle.  Thyroid Problem  Presents for follow-up visit. Symptoms  include dry skin. Patient reports no constipation, diarrhea or fatigue. The symptoms have been stable. Her past medical history is significant for hyperlipidemia.      Review of Systems  Constitutional: Negative for fatigue.  Respiratory: Negative for shortness of breath.   Gastrointestinal: Negative for constipation and diarrhea.  All other systems reviewed and are negative.      Objective:   Physical Exam  Constitutional: She is oriented to person, place, and time. She appears well-developed and well-nourished. No distress.  HENT:  Head: Normocephalic and atraumatic.  Right Ear: External ear normal.  Left Ear: External ear normal.  Nose: Nose normal.  Mouth/Throat: Oropharynx is clear and moist.  Eyes: Pupils are equal, round, and reactive to light.  Neck: Normal range of motion. Neck supple. No thyromegaly present.  Cardiovascular: Normal rate, regular rhythm, normal heart sounds and intact distal pulses.  No murmur heard. Pulmonary/Chest: Effort normal and breath sounds normal. No respiratory distress. She has no wheezes.  Abdominal: Soft. Bowel sounds are normal. She exhibits no distension. There is no tenderness.  Musculoskeletal: Normal range of motion. She exhibits no edema or tenderness.  Neurological: She is alert and oriented to person, place, and time.  Skin: Skin is warm and dry.  Psychiatric: She has a normal mood and affect. Her behavior is normal. Judgment and thought content normal.  Vitals reviewed.   BP 116/80   Pulse 92   Temp (!) 97.1 F (36.2 C)   Ht 5' 4"  (1.626 m)   Wt 178 lb 12.8 oz (81.1 kg)   BMI 30.69 kg/m      Assessment & Plan:  1. Type 2 diabetes mellitus without complication, without long-term current use of insulin (HCC) - Bayer DCA Hb A1c Waived - CMP14+EGFR  2. Hypertension associated with diabetes ([place]) -  CMP14+EGFR  3. Atrial fibrillation, unspecified type ([place]) - CMP14+EGFR  4. Hypothyroidism, unspecified type -  CMP14+EGFR  5. Hyperlipidemia associated with type 2 diabetes mellitus (HCC) - CMP14+EGFR  6. Obesity (BMI 30-39.9) - CMP14+EGFR   Continue all meds Labs pending Health Maintenance reviewed Diet and exercise encouraged RTO 6 months   [name], FNP

## 2017-07-24 NOTE — Patient Instructions (Signed)
Fat and Cholesterol Restricted Diet Getting too much fat and cholesterol in your diet may cause health problems. Following this diet helps keep your fat and cholesterol at normal levels. This can keep you from getting sick. What types of fat should I choose?  Choose monosaturated and polyunsaturated fats. These are found in foods such as olive oil, canola oil, flaxseeds, walnuts, almonds, and seeds.  Eat more omega-3 fats. Good choices include salmon, mackerel, sardines, tuna, flaxseed oil, and ground flaxseeds.  Limit saturated fats. These are in animal products such as meats, butter, and cream. They can also be in plant products such as palm oil, palm kernel oil, and coconut oil.  Avoid foods with partially hydrogenated oils in them. These contain trans fats. Examples of foods that have trans fats are stick margarine, some tub margarines, cookies, crackers, and other baked goods. What general guidelines do I need to follow?  Check food labels. Look for the words "trans fat" and "saturated fat."  When preparing a meal: ? Fill half of your plate with vegetables and green salads. ? Fill one fourth of your plate with whole grains. Look for the word "whole" as the first word in the ingredient list. ? Fill one fourth of your plate with lean protein foods.  Eat more foods that have fiber, like apples, carrots, beans, peas, and barley.  Eat more home-cooked foods. Eat less at restaurants and buffets.  Limit or avoid alcohol.  Limit foods high in starch and sugar.  Limit fried foods.  Cook foods without frying them. Baking, boiling, grilling, and broiling are all great options.  Lose weight if you are overweight. Losing even a small amount of weight can help your overall health. It can also help prevent diseases such as diabetes and heart disease. What foods can I eat? Grains Whole grains, such as whole wheat or whole grain breads, crackers, cereals, and pasta. Unsweetened oatmeal,  bulgur, barley, quinoa, or brown rice. Corn or whole wheat flour tortillas. Vegetables Fresh or frozen vegetables (raw, steamed, roasted, or grilled). Green salads. Fruits All fresh, canned (in natural juice), or frozen fruits. Meat and Other Protein Products Ground beef (85% or leaner), grass-fed beef, or beef trimmed of fat. Skinless chicken or turkey. Ground chicken or turkey. Pork trimmed of fat. All fish and seafood. Eggs. Dried beans, peas, or lentils. Unsalted nuts or seeds. Unsalted canned or dry beans. Dairy Low-fat dairy products, such as skim or 1% milk, 2% or reduced-fat cheeses, low-fat ricotta or cottage cheese, or plain low-fat yogurt. Fats and Oils Tub margarines without trans fats. Light or reduced-fat mayonnaise and salad dressings. Avocado. Olive, canola, sesame, or safflower oils. Natural peanut or almond butter (choose ones without added sugar and oil). The items listed above may not be a complete list of recommended foods or beverages. Contact your dietitian for more options. What foods are not recommended? Grains White bread. White pasta. White rice. Cornbread. Bagels, pastries, and croissants. Crackers that contain trans fat. Vegetables White potatoes. Corn. Creamed or fried vegetables. Vegetables in a cheese sauce. Fruits Dried fruits. Canned fruit in light or heavy syrup. Fruit juice. Meat and Other Protein Products Fatty cuts of meat. Ribs, chicken wings, bacon, sausage, bologna, salami, chitterlings, fatback, hot dogs, bratwurst, and packaged luncheon meats. Liver and organ meats. Dairy Whole or 2% milk, cream, half-and-half, and cream cheese. Whole milk cheeses. Whole-fat or sweetened yogurt. Full-fat cheeses. Nondairy creamers and whipped toppings. Processed cheese, cheese spreads, or cheese curds. Sweets and Desserts Corn   syrup, sugars, honey, and molasses. Candy. Jam and jelly. Syrup. Sweetened cereals. Cookies, pies, cakes, donuts, muffins, and ice  cream. Fats and Oils Butter, stick margarine, lard, shortening, ghee, or bacon fat. Coconut, palm kernel, or palm oils. Beverages Alcohol. Sweetened drinks (such as sodas, lemonade, and fruit drinks or punches). The items listed above may not be a complete list of foods and beverages to avoid. Contact your dietitian for more information. This information is not intended to replace advice given to you by your health care provider. Make sure you discuss any questions you have with your health care provider. Document Released: 12/17/2011 Document Revised: 02/22/2016 Document Reviewed: 09/16/2013 Elsevier Interactive Patient Education  2018 [employer]  

## 2017-08-21 ENCOUNTER — Other Ambulatory Visit: Payer: Self-pay | Admitting: Family

## 2017-08-21 DIAGNOSIS — E119 Type 2 diabetes mellitus without complications: Secondary | ICD-10-CM

## 2017-08-31 ENCOUNTER — Other Ambulatory Visit: Payer: Self-pay | Admitting: Family

## 2017-09-11 ENCOUNTER — Telehealth: Payer: Self-pay | Admitting: Family

## 2017-09-11 DIAGNOSIS — M810 Age-related osteoporosis without current pathological fracture: Secondary | ICD-10-CM

## 2017-09-11 DIAGNOSIS — E119 Type 2 diabetes mellitus without complications: Secondary | ICD-10-CM

## 2017-09-12 MED ORDER — METFORMIN HCL 1000 MG PO TABS: ORAL_TABLET | ORAL | 0 refills | Status: DC

## 2017-09-12 MED ORDER — DILTIAZEM HCL ER COATED BEADS 240 MG PO CP24: ORAL_CAPSULE | ORAL | 0 refills | Status: DC

## 2017-09-12 MED ORDER — LISINOPRIL 40 MG PO TABS: 40.0000 mg | ORAL_TABLET | Freq: Every day | ORAL | 0 refills | Status: DC

## 2017-09-12 MED ORDER — MAGNESIUM OXIDE 400 (241.3 MG) MG PO TABS: 1.0000 | ORAL_TABLET | Freq: Every day | ORAL | 0 refills | Status: DC

## 2017-09-12 MED ORDER — ONETOUCH DELICA LANCETS 33G MISC: 1.0000 | Freq: Every day | 2 refills | Status: DC

## 2017-09-12 MED ORDER — METOPROLOL SUCCINATE ER 25 MG PO TB24: 25.0000 mg | ORAL_TABLET | Freq: Every day | ORAL | 0 refills | Status: DC

## 2017-09-12 MED ORDER — RIVAROXABAN 20 MG PO TABS: 20.0000 mg | ORAL_TABLET | Freq: Every day | ORAL | 0 refills | Status: DC

## 2017-09-12 MED ORDER — GLUCOSE BLOOD VI STRP: ORAL_STRIP | 0 refills | Status: DC

## 2017-09-12 MED ORDER — ALENDRONATE SODIUM 70 MG PO TABS: ORAL_TABLET | ORAL | 0 refills | Status: DC

## 2017-09-12 NOTE — Telephone Encounter (Signed)
Pt aware refill sent to pharmacy 

## 2017-09-16 ENCOUNTER — Telehealth: Payer: Self-pay | Admitting: Family

## 2017-09-16 NOTE — Telephone Encounter (Signed)
Patient aware, script is ready. 

## 2017-10-17 ENCOUNTER — Other Ambulatory Visit: Payer: Self-pay | Admitting: Family

## 2017-10-17 DIAGNOSIS — E119 Type 2 diabetes mellitus without complications: Secondary | ICD-10-CM

## 2017-10-27 ENCOUNTER — Other Ambulatory Visit: Payer: Self-pay | Admitting: Family

## 2017-10-27 DIAGNOSIS — M810 Age-related osteoporosis without current pathological fracture: Secondary | ICD-10-CM

## 2017-10-31 ENCOUNTER — Other Ambulatory Visit: Payer: Self-pay | Admitting: Family

## 2017-10-31 DIAGNOSIS — E119 Type 2 diabetes mellitus without complications: Secondary | ICD-10-CM

## 2017-11-11 ENCOUNTER — Other Ambulatory Visit: Payer: Self-pay | Admitting: Family

## 2017-11-11 DIAGNOSIS — E119 Type 2 diabetes mellitus without complications: Secondary | ICD-10-CM

## 2017-11-30 ENCOUNTER — Other Ambulatory Visit: Payer: Self-pay | Admitting: Family

## 2017-12-01 ENCOUNTER — Other Ambulatory Visit: Payer: Self-pay | Admitting: Family

## 2017-12-08 DIAGNOSIS — I481 Persistent atrial fibrillation: Secondary | ICD-10-CM | POA: Diagnosis not present

## 2017-12-08 DIAGNOSIS — I071 Rheumatic tricuspid insufficiency: Secondary | ICD-10-CM | POA: Diagnosis not present

## 2017-12-08 DIAGNOSIS — Z8673 Personal history of transient ischemic attack (TIA), and cerebral infarction without residual deficits: Secondary | ICD-10-CM | POA: Diagnosis not present

## 2017-12-08 DIAGNOSIS — Z7901 Long term (current) use of anticoagulants: Secondary | ICD-10-CM | POA: Diagnosis not present

## 2017-12-08 DIAGNOSIS — I340 Nonrheumatic mitral (valve) insufficiency: Secondary | ICD-10-CM | POA: Diagnosis not present

## 2017-12-08 DIAGNOSIS — I10 Essential (primary) hypertension: Secondary | ICD-10-CM | POA: Diagnosis not present

## 2018-01-09 DIAGNOSIS — I10 Essential (primary) hypertension: Secondary | ICD-10-CM | POA: Diagnosis not present

## 2018-01-09 DIAGNOSIS — E279 Disorder of adrenal gland, unspecified: Secondary | ICD-10-CM | POA: Diagnosis not present

## 2018-01-20 DIAGNOSIS — N281 Cyst of kidney, acquired: Secondary | ICD-10-CM | POA: Diagnosis not present

## 2018-01-20 DIAGNOSIS — E278 Other specified disorders of adrenal gland: Secondary | ICD-10-CM | POA: Diagnosis not present

## 2018-01-20 DIAGNOSIS — K7689 Other specified diseases of liver: Secondary | ICD-10-CM | POA: Diagnosis not present

## 2018-01-20 DIAGNOSIS — E279 Disorder of adrenal gland, unspecified: Secondary | ICD-10-CM | POA: Diagnosis not present

## 2018-01-20 DIAGNOSIS — D3502 Benign neoplasm of left adrenal gland: Secondary | ICD-10-CM | POA: Diagnosis not present

## 2018-02-04 ENCOUNTER — Ambulatory Visit (INDEPENDENT_AMBULATORY_CARE_PROVIDER_SITE_OTHER): Payer: Medicare Other | Admitting: Family Medicine

## 2018-02-04 ENCOUNTER — Encounter: Payer: Self-pay | Admitting: Family Medicine

## 2018-02-04 VITALS — BP 164/86 | HR 101 | Temp 97.1°F | Ht 64.0 in | Wt 179.8 lb

## 2018-02-04 DIAGNOSIS — L918 Other hypertrophic disorders of the skin: Secondary | ICD-10-CM | POA: Diagnosis not present

## 2018-02-04 DIAGNOSIS — D229 Melanocytic nevi, unspecified: Secondary | ICD-10-CM

## 2018-02-04 NOTE — Progress Notes (Signed)
BP (!) 164/86   Pulse (!) 101   Temp (!) 97.1 F (36.2 C) (Oral)   Ht 5\' 4"  (1.626 m)   Wt 179 lb 12.8 oz (81.6 kg)   BMI 30.86 kg/m    Subjective:    Patient ID: Sheila Ray, female    DOB: 29-Jan-1941, 77 y.o.   MRN: [id number]  HPI: Sheila Ray is a 77 y.o. female presenting on 02/04/2018 for Tick Removal (groin area- Patient states it needs to be removed)   HPI Skin lesion, concern for possible tick Patient is coming in today because a couple days ago she noticed a lesion on her skin of her lower abdomen/mons pubis near her C-section scar that she could not really tell but she thought it might be a tick and wanted to come in to get it checked out.  She denies any fevers or chills or drainage or rash or abnormal rashes anywhere else.  She says she was out in the woods with the grandkids and thinks that where she could have possibly picked up a tick.  Relevant past medical, surgical, family and social history reviewed and updated as indicated. Interim medical history since our last visit reviewed. Allergies and medications reviewed and updated.  Review of Systems  Constitutional: Negative for chills and fever.  Eyes: Negative for visual disturbance.  Respiratory: Negative for chest tightness and shortness of breath.   Cardiovascular: Negative for chest pain and leg swelling.  Musculoskeletal: Negative for back pain and gait problem.  Skin: Positive for rash.  Neurological: Negative for light-headedness and headaches.  Psychiatric/Behavioral: Negative for agitation and behavioral problems.  All other systems reviewed and are negative.   Per HPI unless specifically indicated above   Allergies as of 02/04/2018      Reactions   Crestor [rosuvastatin]    Cramps   Lipitor [atorvastatin]    Cramps   Pravastatin Other (See Comments)   Cramps    Keflex [cephalexin] Hives      Medication List        Accurate as of 02/04/18  8:39 AM. Always use your most recent med list.           alendronate 70 MG tablet Commonly known as:  FOSAMAX TAKE 1 TABLET WEEKLY AS  DIRECTED   diltiazem 240 MG 24 hr capsule Commonly known as:  CARDIZEM CD TAKE 1 CAPSULE BY MOUTH EVERY DAY   glucose blood test strip Commonly known as:  ONE TOUCH ULTRA TEST USE TO CHECK BLOOD GLUCOSE  ONCE A DAY AS INSTRUCTED   lisinopril 40 MG tablet Commonly known as:  PRINIVIL,ZESTRIL TAKE 1 TABLET BY MOUTH  DAILY   magnesium oxide 400 (241.3 Mg) MG tablet Commonly known as:  MAG-OX Take 1 tablet (400 mg total) by mouth daily.   metFORMIN 1000 MG tablet Commonly known as:  GLUCOPHAGE TAKE 1 TABLET BY MOUTH WITH BREAKFAST   metoprolol succinate 25 MG 24 hr tablet Commonly known as:  TOPROL-XL TAKE 1 TABLET BY MOUTH AT  BEDTIME   ONETOUCH DELICA LANCETS 23N Misc 1 each by Does not apply route daily. Use to check BG daily   Vitamin B12 1000 MCG Tbcr Take by mouth.   XARELTO 20 MG Tabs tablet Generic drug:  rivaroxaban TAKE 1 TABLET BY MOUTH  DAILY          Objective:    BP (!) 164/86   Pulse (!) 101   Temp (!) 97.1 F (36.2 C) (Oral)  Ht 5\' 4"  (1.626 m)   Wt 179 lb 12.8 oz (81.6 kg)   BMI 30.86 kg/m   Wt Readings from Last 3 Encounters:  02/04/18 179 lb 12.8 oz (81.6 kg)  07/24/17 178 lb 12.8 oz (81.1 kg)  02/25/17 178 lb (80.7 kg)    Physical Exam  Constitutional: She is oriented to person, place, and time. She appears well-developed and well-nourished. No distress.  Eyes: Conjunctivae are normal.  Neurological: She is alert and oriented to person, place, and time. Coordination normal.  Skin: Skin is warm and dry. Lesion (2 small lesions in the area where she thinks she may have felt a tick, one is a small benign nevus that has normal contour smooth color and is pinpoint in size, the other is a skin tag) noted. No rash noted. She is not diaphoretic.  Psychiatric: She has a normal mood and affect. Her behavior is normal.  Nursing note and vitals  reviewed.       Assessment & Plan:   Problem List Items Addressed This Visit    None    Visit Diagnoses    Benign nevus    -  Primary   Skin tag          Lesion where she was feeling a spot on her mons pubis near her C-section scar line, there was a skin tag and a benign nevus there but no signs of a tick bite Follow up plan: Return if symptoms worsen or fail to improve.  Counseling provided for all of the vaccine components No orders of the defined types were placed in this encounter.   Caryl Pina, MD [facility] 02/04/2018, 8:39 AM

## 2018-02-06 ENCOUNTER — Ambulatory Visit (INDEPENDENT_AMBULATORY_CARE_PROVIDER_SITE_OTHER): Payer: Medicare Other

## 2018-02-06 VITALS — BP 138/64 | HR 82 | Temp 97.1°F | Ht 64.0 in | Wt 180.0 lb

## 2018-02-06 DIAGNOSIS — Z0000 Encounter for general adult medical examination without abnormal findings: Secondary | ICD-10-CM

## 2018-02-06 NOTE — Patient Instructions (Signed)
  Ms. Ariel , Thank you for taking time to come for your Medicare Wellness Visit. I appreciate your ongoing commitment to your health goals. Please review the following plan we discussed and let me know if I can assist you in the future.   These are the goals we discussed: Goals    . DIET - EAT MORE FRUITS AND VEGETABLES    . Exercise 3x per week (30 min per time)     Walk for at least 30 minutes 3 times per week       This is a list of the screening recommended for you and due dates:  Health Maintenance  Topic Date Due  . Eye exam for diabetics  08/08/2017  . Hemoglobin A1C  08/28/2017  . Flu Shot  01/29/2018  . Complete foot exam   02/25/2018  . Mammogram  03/17/2018  . Tetanus Vaccine  04/28/2025  . DEXA scan (bone density measurement)  Completed  . Pneumonia vaccines  Completed

## 2018-02-06 NOTE — Progress Notes (Addendum)
Subjective:   Sheila Ray is a 77 y.o. female who presents for Medicare Annual (Subsequent) preventive examination. She currently resides in [facility]. She is divorced and lives alone. She has one son that lives nearby and she talks and visits with him often. 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 She has done this type of work for quite some time and enjoys it. She has also worked in [employer], tobacco, and restaurants over the years. She enjoys reading and tries to walk at least 3 times a week for 30 minutes for exercise when she can. She currently lives in a [place] but her son is considering building a Curator on his property for her so she can be nearby at all times. She is very excited about this. She does not have an advanced directive and states that she already has information about one.   Review of Systems:   Cardiac Risk Factors include: advanced age (>56men, >79 women);diabetes mellitus;dyslipidemia;hypertension;obesity (BMI >30kg/m2)     Objective:     Vitals: BP 138/64   Pulse 82   Temp (!) 97.1 F (36.2 C) (Oral)   Ht 5\' 4"  (1.626 m)   Wt 180 lb (81.6 kg)   BMI 30.90 kg/m   Body mass index is 30.9 kg/m.  Advanced Directives 02/06/2018 01/03/2017 12/06/2014 09/01/2014 07/25/2014 07/25/2014 07/19/2014  Does Patient Have a Medical Advance Directive? No No No No No (No Data) No  Would patient like information on creating a medical advance directive? No - Patient declined Yes (MAU/Ambulatory/Procedural Areas - Information given) Yes - Educational materials given - Yes - Educational materials given - -   Patient states that she has already been given information about it and declines any more at this time.  Tobacco Social History   Tobacco Use  Smoking Status Never Smoker  Smokeless Tobacco Never Used     Patient has never been a smoker  Clinical Intake:  Pre-visit preparation completed: No  Pain : No/denies pain     BMI -  recorded: 30.85 Nutritional Status: BMI > 30  Obese Nutritional Risks: None Diabetes: Yes CBG done?: No Did pt. bring in CBG monitor from home?: No  How often do you need to have someone help you when you read instructions, pamphlets, or other written materials from your doctor or pharmacy?: 1 - Never What is the last grade level you completed in school?: 12th grade  Interpreter Needed?: No  Information entered by :: Theodoro Clock LPN  Past Medical History:  Diagnosis Date  . Arthritis   . Atrial fibrillation ([place])   . Complication of anesthesia   . Diabetes mellitus without complication ([place])   . Diverticulosis   . Fatigue   . Gastric polyp   . Goiter   . Hyperlipidemia   . Hypertension   . Hypothyroid    taken off of thyroid medication 2012014   . Obese   . Osteopenia   . PONV (postoperative nausea and vomiting)   . Postmenopausal   . Rosacea   . 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 Vitamin D deficiency    Past Surgical History:  Procedure Laterality Date  . ABDOMINAL HYSTERECTOMY  1985  . FOOT SURGERY Right 08/2002  . right knee arthroscopy   2013   . TONSILLECTOMY    . 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   Procedure: RIGHT TOTAL KNEE ARTHROPLASTY;  Surgeon: Pilar Plate  Zella Ball, MD;  Location: [facility];  Service: Orthopedics;  Laterality: Right;   Family History  Problem Relation Age of Onset  . Osteoporosis Mother   . Alzheimer's disease Mother 33  . Arthritis Mother   . Cancer Father        Lung  . Arthritis Sister   . Obesity Sister   . Heart attack Brother 33  . Heart disease Son   . Arthritis Brother   . Arthritis Sister   . Cancer Sister        breast cancer   Social History   Socioeconomic History  . Marital status: Divorced    Spouse name: Not on file  . Number of children: Not on file  . Years of education: Not on file  . Highest education level: 12th grade  Occupational History  . 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 Financial resource strain: Not hard at all  . Food insecurity:    Worry: Never true    Inability: Never true  . Transportation needs:    Medical: No    Non-medical: No  Tobacco Use  . Smoking status: Never Smoker  . Smokeless tobacco: Never Used  Substance and Sexual Activity  . Alcohol use: No  . Drug use: No  . Sexual activity: Not Currently  Lifestyle  . Physical activity:    Days per week: 3 days    Minutes per session: 30 min  . Stress: Not at all  Relationships  . Social connections:    Talks on phone: More than three times a week    Gets together: More than three times a week    Attends religious service: More than 4 times per year    Active member of club or organization: No    Attends meetings of clubs or organizations: Never    Relationship status: Divorced  Other Topics Concern  . Not on file  Social History Narrative  . Not on file    Outpatient Encounter Medications as of 02/06/2018  Medication Sig  . alendronate (FOSAMAX) 70 MG tablet TAKE 1 TABLET WEEKLY AS  DIRECTED  . Cyanocobalamin (VITAMIN B12) 1000 MCG TBCR Take by mouth.  . diltiazem (CARDIZEM CD) 240 MG 24 hr capsule TAKE 1 CAPSULE BY MOUTH EVERY DAY  . glucose blood (ONE TOUCH ULTRA TEST) test strip USE TO CHECK BLOOD GLUCOSE  ONCE A DAY AS INSTRUCTED  . lisinopril (PRINIVIL,ZESTRIL) 40 MG tablet TAKE 1 TABLET BY MOUTH  DAILY  . magnesium oxide (MAG-OX) 400 (241.3 Mg) MG tablet Take 1 tablet (400 mg total) by mouth daily.  . metFORMIN (GLUCOPHAGE) 1000 MG tablet TAKE 1 TABLET BY MOUTH WITH BREAKFAST  . metoprolol succinate (TOPROL-XL) 25 MG 24 hr tablet TAKE 1 TABLET BY MOUTH AT  BEDTIME  . ONETOUCH DELICA LANCETS 53G MISC 1 each by Does not apply route daily. Use to check BG daily  . XARELTO 20 MG TABS tablet TAKE 1 TABLET BY MOUTH  DAILY   No facility-administered encounter medications on file as of 02/06/2018.     Activities of Daily  Living In your present state of health, do you have any difficulty performing the following activities: 02/06/2018  Hearing? Y  Comment Has a little difficulty but not bad  Vision? Y  Comment Wears glasses all the time  Difficulty concentrating or making decisions? N  Walking or climbing stairs? N  Dressing or  bathing? N  Doing errands, shopping? N  Preparing Food and eating ? N  Using the Toilet? N  In the past six months, have you accidently leaked urine? N  Do you have problems with loss of bowel control? N  Managing your Medications? N  Managing your Finances? N  Housekeeping or managing your Housekeeping? N  Some recent data might be hidden    Patient Care Team: Sharion Balloon, FNP as PCP - General (Family Medicine) Gaynelle Arabian, MD as Consulting Physician (Orthopedic Surgery) Clent Jacks, MD as Consulting Physician (Ophthalmology) Su Ley as Physician Assistant (Physician Assistant) Heriberto Antigua, MD as Referring Physician (Urology) Trula Slade, MD as Referring Physician (Cardiology)    Assessment:   This is a routine wellness examination for Chamari.  Exercise Activities and Dietary recommendations Current Exercise Habits: The patient does not participate in regular exercise at present, Exercise limited by: None identified  Patient tries to walk for 30 minutes 3 times a week when she is able.  Goals    . DIET - EAT MORE FRUITS AND VEGETABLES    . Exercise 3x per week (30 min per time)     Walk for at least 30 minutes 3 times per week       Fall Risk Fall Risk  02/06/2018 02/04/2018 07/24/2017 02/25/2017 01/03/2017  Falls in the past year? Yes No Yes No No  Number falls in past yr: 1 - 1 - -  Injury with Fall? No - No - -   Is the patient's home free of loose throw rugs in walkways, pet beds, electrical cords, etc?   yes      Grab bars in the bathroom? no      Handrails on the stairs?   Patient has no stairs in her home      Adequate lighting?    yes    Depression Screen PHQ 2/9 Scores 02/06/2018 02/04/2018 07/24/2017 02/25/2017  PHQ - 2 Score 0 0 0 0     Cognitive Function MMSE - Mini Mental State Exam 02/06/2018 01/03/2017 01/03/2017 12/06/2014  Orientation to time 5 5 5 5   Orientation to Place 5 5 5 5   Registration 3 3 3 3   Attention/ Calculation 3 2 2 5   Recall 3 2 3 3   Language- name 2 objects 2 2 2 2   Language- repeat 1 0 0 1  Language- follow 3 step command 3 3 3 3   Language- read & follow direction 1 1 1 1   Write a sentence 1 1 1 1   Copy design 1 1 1 1   Total score 28 25 26 30     Patient did well with the MMSE although it did take her some time to think it through    Immunization History  Administered Date(s) Administered  . Influenza Whole 05/16/2012  . Influenza,inj,Quad PF,6+ Mos 03/01/2013, 04/06/2014, 05/19/2015  . Pneumococcal Conjugate-13 12/06/2014  . Pneumococcal Polysaccharide-23 11/29/2005  . Td 09/14/2002, 04/29/2015  . Zoster 04/17/2011    Qualifies for Shingles Vaccine? Updated with Shingrix vaccine today  Screening Tests Health Maintenance  Topic Date Due  . OPHTHALMOLOGY EXAM  08/08/2017  . HEMOGLOBIN A1C  08/28/2017  . INFLUENZA VACCINE  01/29/2018  . FOOT EXAM  02/25/2018  . MAMMOGRAM  03/17/2018  . TETANUS/TDAP  04/28/2025  . DEXA SCAN  Completed  . PNA vac Low Risk Adult  Completed    Cancer Screenings: Lung: Low Dose CT Chest recommended if Age 76-80 years, 36  pack-year currently smoking OR have quit w/in 15years. Patient does not qualify. Breast:  Up to date on Mammogram? Yes   Up to date of Bone Density/Dexa? Yes Colorectal: Patient does not have a recent colonoscopy  Additional Screenings:  Hepatitis C Screening: Patient does not fall in the guidelines for this     Plan:    I have personally reviewed and noted the following in the patient's chart:   . Medical and social history . Use of alcohol, tobacco or illicit drugs  . Current medications and supplements . Functional  ability and status . Nutritional status . Physical activity . Advanced directives . List of other physicians . Hospitalizations, surgeries, and ER visits in previous 12 months . Vitals . Screenings to include cognitive, depression, and falls . Referrals and appointments  In addition, I have reviewed and discussed with patient certain preventive protocols, quality metrics, and best practice recommendations. A written personalized care plan for preventive services as well as general preventive health recommendations were provided to patient.   Patient was advised that she is due for a chronic follow up and diabetes check up. She states that she has so many appts right now that she will call back for an appt for this.   Rolena Infante, LPN  6/0/4540   I have reviewed and agree with the above AWV documentation.   [name], FNP

## 2018-02-06 NOTE — Addendum Note (Signed)
Addended by: Rolena Infante on: 02/06/2018 11:52 AM   Modules accepted: Orders

## 2018-02-27 ENCOUNTER — Other Ambulatory Visit: Payer: Self-pay | Admitting: Family

## 2018-02-27 DIAGNOSIS — M810 Age-related osteoporosis without current pathological fracture: Secondary | ICD-10-CM

## 2018-03-03 ENCOUNTER — Other Ambulatory Visit: Payer: Self-pay | Admitting: Family

## 2018-03-03 DIAGNOSIS — E119 Type 2 diabetes mellitus without complications: Secondary | ICD-10-CM

## 2018-03-05 NOTE — Telephone Encounter (Signed)
NA, no voicemail.  

## 2018-03-10 NOTE — Telephone Encounter (Signed)
Unable to leave a voice mail.  Patient needs to have lab work and office visit.

## 2018-03-16 ENCOUNTER — Other Ambulatory Visit: Payer: Self-pay | Admitting: Family

## 2018-03-16 MED ORDER — LISINOPRIL 40 MG PO TABS: 40.0000 mg | ORAL_TABLET | Freq: Every day | ORAL | 0 refills | Status: DC

## 2018-03-16 MED ORDER — METOPROLOL SUCCINATE ER 25 MG PO TB24: 25.0000 mg | ORAL_TABLET | Freq: Every day | ORAL | 0 refills | Status: DC

## 2018-03-16 MED ORDER — RIVAROXABAN 20 MG PO TABS: 20.0000 mg | ORAL_TABLET | Freq: Every day | ORAL | 0 refills | Status: DC

## 2018-03-16 NOTE — Telephone Encounter (Signed)
appt made & refill sent to mail order pharmacy

## 2018-03-20 ENCOUNTER — Encounter: Payer: Self-pay | Admitting: Pediatrics

## 2018-03-20 ENCOUNTER — Ambulatory Visit (INDEPENDENT_AMBULATORY_CARE_PROVIDER_SITE_OTHER): Payer: Medicare Other | Admitting: Pediatrics

## 2018-03-20 VITALS — BP 131/79 | HR 87 | Temp 97.9°F | Ht 64.0 in | Wt 181.0 lb

## 2018-03-20 DIAGNOSIS — Z23 Encounter for immunization: Secondary | ICD-10-CM | POA: Diagnosis not present

## 2018-03-20 DIAGNOSIS — E119 Type 2 diabetes mellitus without complications: Secondary | ICD-10-CM

## 2018-03-20 DIAGNOSIS — M810 Age-related osteoporosis without current pathological fracture: Secondary | ICD-10-CM

## 2018-03-20 MED ORDER — ALENDRONATE SODIUM 70 MG PO TABS: ORAL_TABLET | ORAL | 0 refills | Status: DC

## 2018-03-20 NOTE — Progress Notes (Signed)
  Subjective:   Patient ID: Sheila Ray, female    DOB: June 04, 1941, 77 y.o.   MRN: [id number] CC: Medical Management of Chronic Issues  HPI: Sheila Ray is a 77 y.o. female   Diabetes: Takes metformin daily.  Tries to avoid carbohydrates, sugary foods.  Stays fairly active.  Osteoporosis: Taking alendronate.  Is off of it for a year about 4 years ago.  Atrial fibrillation: On blood thinner.  No recent bleeding, no trouble with her medicines.  Following with cardiology.  Hypertension: Taking medicine regularly.  No chest pain or shortness of breath with exertion no lightheaded or dizziness when she stands up.  Hypothyroidism: In the past she was on a small dose of levothyroxine which was then stopped because she did not need it anymore.  She did have thyroid nodules, was seen by endocrinology for several years, determined to be benign and not needing any further follow-up per patient.  Most recent TSH levels is off of levothyroxine have been normal.  Relevant past medical, surgical, family and social history reviewed. Allergies and medications reviewed and updated. Social History   Tobacco Use  Smoking Status Never Smoker  Smokeless Tobacco Never Used   ROS: Per HPI   Objective:    BP 131/79   Pulse 87   Temp 97.9 F (36.6 C) (Oral)   Ht '5\' 4"'$  (1.626 m)   Wt 181 lb (82.1 kg)   BMI 31.07 kg/m   Wt Readings from Last 3 Encounters:  03/20/18 181 lb (82.1 kg)  02/06/18 180 lb (81.6 kg)  02/04/18 179 lb 12.8 oz (81.6 kg)   Gen: NAD, alert, cooperative with exam, NCAT EYES: EOMI, no conjunctival injection, or no icterus ENT:  TMs pearly gray b/l, OP without erythema LYMPH: no cervical LAD CV: NRRR, normal S1/S2, no murmur, distal pulses 2+ b/l Resp: CTABL, no wheezes, normal WOB Abd: +BS, soft, NTND. no guarding or organomegaly Ext: No edema, warm Neuro: Alert and oriented, strength equal b/l UE and LE, coordination grossly normal MSK: normal muscle bulk  Assessment  & Plan:  [place] was seen today for medical management of chronic issues.  Diagnoses and all orders for this visit:  Diabetes mellitus without complication (HCC) Last L9J 5.3, due for repeat.  Continue metformin. -     Bayer DCA Hb A1c Waived -     Microalbumin / creatinine urine ratio  Osteoporosis, unspecified osteoporosis type, unspecified pathological fracture presence Stable, consider a year off of alendronate after being on it for 5 years, now at four years continuous use.  Due for DEXA scan.  Patient wants to wait until she sees the PCP. -     alendronate (FOSAMAX) 70 MG tablet; TAKE 1 TABLET BY MOUTH  WEEKLY AS DIRECTED -     VITAMIN D 25 Hydroxy (Vit-D Deficiency, Fractures) -     BMP8+EGFR -     TSH  Need for shingles vaccine -     Varicella-zoster vaccine IM (Shingrix)   Follow up plan: Return in about 6 months (around 09/18/2018) for chronic f/u PCP. Assunta Found, MD [place]

## 2018-03-21 ENCOUNTER — Encounter: Payer: Self-pay | Admitting: Pediatrics

## 2018-04-17 ENCOUNTER — Other Ambulatory Visit: Payer: Self-pay | Admitting: Family

## 2018-04-17 DIAGNOSIS — E119 Type 2 diabetes mellitus without complications: Secondary | ICD-10-CM

## 2018-05-04 ENCOUNTER — Other Ambulatory Visit: Payer: Self-pay | Admitting: Pediatrics

## 2018-05-04 DIAGNOSIS — M810 Age-related osteoporosis without current pathological fracture: Secondary | ICD-10-CM

## 2018-05-07 DIAGNOSIS — Z1231 Encounter for screening mammogram for malignant neoplasm of breast: Secondary | ICD-10-CM | POA: Diagnosis not present

## 2018-05-07 LAB — HM MAMMOGRAPHY

## 2018-06-08 ENCOUNTER — Other Ambulatory Visit: Payer: Self-pay | Admitting: Family

## 2018-06-08 DIAGNOSIS — E119 Type 2 diabetes mellitus without complications: Secondary | ICD-10-CM

## 2018-07-15 DIAGNOSIS — L821 Other seborrheic keratosis: Secondary | ICD-10-CM | POA: Diagnosis not present

## 2018-07-15 DIAGNOSIS — L304 Erythema intertrigo: Secondary | ICD-10-CM | POA: Diagnosis not present

## 2018-07-16 DIAGNOSIS — K5900 Constipation, unspecified: Secondary | ICD-10-CM | POA: Diagnosis not present

## 2018-07-16 DIAGNOSIS — Z8673 Personal history of transient ischemic attack (TIA), and cerebral infarction without residual deficits: Secondary | ICD-10-CM | POA: Diagnosis not present

## 2018-07-16 DIAGNOSIS — I4891 Unspecified atrial fibrillation: Secondary | ICD-10-CM | POA: Diagnosis not present

## 2018-07-16 DIAGNOSIS — I482 Chronic atrial fibrillation, unspecified: Secondary | ICD-10-CM | POA: Diagnosis not present

## 2018-07-16 DIAGNOSIS — J811 Chronic pulmonary edema: Secondary | ICD-10-CM | POA: Diagnosis not present

## 2018-07-16 DIAGNOSIS — R112 Nausea with vomiting, unspecified: Secondary | ICD-10-CM | POA: Diagnosis not present

## 2018-07-16 DIAGNOSIS — Z7984 Long term (current) use of oral hypoglycemic drugs: Secondary | ICD-10-CM | POA: Diagnosis not present

## 2018-07-16 DIAGNOSIS — I471 Supraventricular tachycardia: Secondary | ICD-10-CM | POA: Diagnosis not present

## 2018-07-16 DIAGNOSIS — E119 Type 2 diabetes mellitus without complications: Secondary | ICD-10-CM | POA: Diagnosis not present

## 2018-07-16 DIAGNOSIS — Z888 Allergy status to other drugs, medicaments and biological substances status: Secondary | ICD-10-CM | POA: Diagnosis not present

## 2018-07-16 DIAGNOSIS — I509 Heart failure, unspecified: Secondary | ICD-10-CM | POA: Diagnosis not present

## 2018-07-16 DIAGNOSIS — D539 Nutritional anemia, unspecified: Secondary | ICD-10-CM | POA: Diagnosis not present

## 2018-07-16 DIAGNOSIS — Z79899 Other long term (current) drug therapy: Secondary | ICD-10-CM | POA: Diagnosis not present

## 2018-07-16 DIAGNOSIS — R0600 Dyspnea, unspecified: Secondary | ICD-10-CM | POA: Diagnosis not present

## 2018-07-16 DIAGNOSIS — Z9119 Patient's noncompliance with other medical treatment and regimen: Secondary | ICD-10-CM | POA: Diagnosis not present

## 2018-07-16 DIAGNOSIS — E559 Vitamin D deficiency, unspecified: Secondary | ICD-10-CM | POA: Diagnosis not present

## 2018-07-16 DIAGNOSIS — D509 Iron deficiency anemia, unspecified: Secondary | ICD-10-CM | POA: Diagnosis not present

## 2018-07-16 DIAGNOSIS — I499 Cardiac arrhythmia, unspecified: Secondary | ICD-10-CM | POA: Diagnosis not present

## 2018-07-16 DIAGNOSIS — Z7901 Long term (current) use of anticoagulants: Secondary | ICD-10-CM | POA: Diagnosis not present

## 2018-07-16 DIAGNOSIS — I110 Hypertensive heart disease with heart failure: Secondary | ICD-10-CM | POA: Diagnosis not present

## 2018-07-16 DIAGNOSIS — I10 Essential (primary) hypertension: Secondary | ICD-10-CM | POA: Diagnosis not present

## 2018-07-21 MED ORDER — DILTIAZEM HCL ER COATED BEADS 240 MG PO CP24
240.00 | ORAL_CAPSULE | ORAL | Status: DC
Start: 2018-07-21 — End: 2018-07-21

## 2018-07-21 MED ORDER — METOPROLOL SUCCINATE ER 25 MG PO TB24
25.00 | ORAL_TABLET | ORAL | Status: DC
Start: 2018-07-21 — End: 2018-07-21

## 2018-07-21 MED ORDER — GENERIC EXTERNAL MEDICATION
650.00 | Status: DC
Start: ? — End: 2018-07-21

## 2018-07-21 MED ORDER — ARIPIPRAZOLE 2 MG PO TABS
2.00 | ORAL_TABLET | ORAL | Status: DC
Start: ? — End: 2018-07-21

## 2018-07-21 MED ORDER — RIVAROXABAN 10 MG PO TABS
20.00 | ORAL_TABLET | ORAL | Status: DC
Start: 2018-07-20 — End: 2018-07-21

## 2018-07-21 MED ORDER — POLYETHYLENE GLYCOL 3350 17 G PO PACK
17.00 | PACK | ORAL | Status: DC
Start: ? — End: 2018-07-21

## 2018-07-21 MED ORDER — GENERIC EXTERNAL MEDICATION
10.00 | Status: DC
Start: ? — End: 2018-07-21

## 2018-07-21 MED ORDER — HYDROXYZINE HCL 25 MG PO TABS
25.00 | ORAL_TABLET | ORAL | Status: DC
Start: ? — End: 2018-07-21

## 2018-07-21 MED ORDER — VITAMIN B-12 1000 MCG PO TABS
1000.00 | ORAL_TABLET | ORAL | Status: DC
Start: 2018-07-21 — End: 2018-07-21

## 2018-07-21 MED ORDER — ACETAMINOPHEN 325 MG PO TABS
650.00 | ORAL_TABLET | ORAL | Status: DC
Start: ? — End: 2018-07-21

## 2018-07-21 MED ORDER — INSULIN LISPRO 100 UNIT/ML ~~LOC~~ SOLN
0.00 | SUBCUTANEOUS | Status: DC
Start: 2018-07-20 — End: 2018-07-21

## 2018-07-21 MED ORDER — ONDANSETRON HCL 4 MG/2ML IJ SOLN
4.00 | INTRAMUSCULAR | Status: DC
Start: ? — End: 2018-07-21

## 2018-07-21 MED ORDER — NITROGLYCERIN 0.4 MG SL SUBL
.40 | SUBLINGUAL_TABLET | SUBLINGUAL | Status: DC
Start: ? — End: 2018-07-21

## 2018-07-21 MED ORDER — SODIUM CHLORIDE 0.9 % IV SOLN
10.00 | INTRAVENOUS | Status: DC
Start: ? — End: 2018-07-21

## 2018-07-21 MED ORDER — PROMETHAZINE HCL 25 MG/ML IJ SOLN
12.50 | INTRAMUSCULAR | Status: DC
Start: ? — End: 2018-07-21

## 2018-07-21 MED ORDER — GENERIC EXTERNAL MEDICATION
125.00 | Status: DC
Start: 2018-07-21 — End: 2018-07-21

## 2018-08-16 ENCOUNTER — Other Ambulatory Visit: Payer: Self-pay | Admitting: Family

## 2018-08-18 ENCOUNTER — Telehealth: Payer: Self-pay | Admitting: Family

## 2018-09-04 NOTE — Telephone Encounter (Signed)
Multiple attempts made to contact patient.  This encounter will now be closed  

## 2018-09-21 ENCOUNTER — Other Ambulatory Visit: Payer: Self-pay | Admitting: Family

## 2018-09-21 NOTE — Telephone Encounter (Signed)
Last seen 03/20/18  [facility]

## 2018-10-18 ENCOUNTER — Other Ambulatory Visit: Payer: Self-pay | Admitting: Family

## 2018-10-18 DIAGNOSIS — E119 Type 2 diabetes mellitus without complications: Secondary | ICD-10-CM

## 2018-10-19 ENCOUNTER — Encounter: Payer: Self-pay | Admitting: *Deleted

## 2018-10-19 ENCOUNTER — Other Ambulatory Visit: Payer: Self-pay | Admitting: *Deleted

## 2018-10-19 NOTE — Telephone Encounter (Signed)
Number does not work.  Can not contact patient.  Refill script ?

## 2018-10-19 NOTE — Telephone Encounter (Signed)
Patient needs a visit or at least to call and discuss this with [place].  We cannot just refill this at this point.  Only other thing I could say is try contacting her through her pharmacy

## 2018-10-19 NOTE — Telephone Encounter (Signed)
[facility] mail order. Last OV 03/2018

## 2018-10-21 ENCOUNTER — Other Ambulatory Visit: Payer: Self-pay | Admitting: Family

## 2018-10-21 DIAGNOSIS — E119 Type 2 diabetes mellitus without complications: Secondary | ICD-10-CM

## 2018-10-21 NOTE — Telephone Encounter (Signed)
Please advise - no A1c since 02/25/2017.  Patient's number out of service.

## 2018-10-21 NOTE — Telephone Encounter (Signed)
Patient's number not in service, patient's emergency contact voicemail not set up.

## 2018-10-29 ENCOUNTER — Other Ambulatory Visit: Payer: Self-pay

## 2018-10-29 ENCOUNTER — Ambulatory Visit (INDEPENDENT_AMBULATORY_CARE_PROVIDER_SITE_OTHER): Payer: Medicare Other | Admitting: Family

## 2018-10-29 ENCOUNTER — Encounter: Payer: Self-pay | Admitting: Family

## 2018-10-29 VITALS — BP 163/93 | HR 79 | Temp 97.9°F | Ht 64.0 in | Wt 173.2 lb

## 2018-10-29 DIAGNOSIS — E119 Type 2 diabetes mellitus without complications: Secondary | ICD-10-CM

## 2018-10-29 DIAGNOSIS — E669 Obesity, unspecified: Secondary | ICD-10-CM

## 2018-10-29 DIAGNOSIS — E785 Hyperlipidemia, unspecified: Secondary | ICD-10-CM | POA: Diagnosis not present

## 2018-10-29 DIAGNOSIS — E039 Hypothyroidism, unspecified: Secondary | ICD-10-CM

## 2018-10-29 DIAGNOSIS — E1159 Type 2 diabetes mellitus with other circulatory complications: Secondary | ICD-10-CM | POA: Diagnosis not present

## 2018-10-29 DIAGNOSIS — M1711 Unilateral primary osteoarthritis, right knee: Secondary | ICD-10-CM

## 2018-10-29 DIAGNOSIS — M810 Age-related osteoporosis without current pathological fracture: Secondary | ICD-10-CM

## 2018-10-29 DIAGNOSIS — E1169 Type 2 diabetes mellitus with other specified complication: Secondary | ICD-10-CM

## 2018-10-29 DIAGNOSIS — I10 Essential (primary) hypertension: Secondary | ICD-10-CM

## 2018-10-29 DIAGNOSIS — I4891 Unspecified atrial fibrillation: Secondary | ICD-10-CM | POA: Diagnosis not present

## 2018-10-29 DIAGNOSIS — I152 Hypertension secondary to endocrine disorders: Secondary | ICD-10-CM

## 2018-10-29 LAB — BAYER DCA HB A1C WAIVED: HB A1C (BAYER DCA - WAIVED): 6.6 % (ref <7.0)

## 2018-10-29 MED ORDER — METFORMIN HCL 1000 MG PO TABS: 500.0000 mg | ORAL_TABLET | Freq: Every day | ORAL | 3 refills | Status: DC

## 2018-10-29 MED ORDER — METOPROLOL SUCCINATE ER 50 MG PO TB24: 50.0000 mg | ORAL_TABLET | Freq: Every day | ORAL | 3 refills | Status: DC

## 2018-10-29 NOTE — Progress Notes (Signed)
Subjective:    Patient ID: Sheila Ray, female    DOB: Oct 30, 1940, 78 y.o.   MRN: [id number] Chief Complaint  Patient presents with   Medical Management of Chronic Issues   Hypertension    Pt presents to the office today for chronic follow up. Pt is followed by Cardiologists annually  for A Fib. Pt states this is doing well. Diabetes  She presents for her follow-up diabetic visit. She has type 2 diabetes mellitus. Her disease course has been stable. There are no hypoglycemic associated symptoms. Pertinent negatives for diabetes include no blurred vision, no foot paresthesias and no visual change. There are no hypoglycemic complications. Pertinent negatives for diabetic complications include no CVA, heart disease or peripheral neuropathy. Risk factors for coronary artery disease include dyslipidemia, diabetes mellitus, hypertension, sedentary lifestyle and post-menopausal. She is following a generally healthy diet. Her overall blood glucose range is 130-140 mg/dl. Eye exam is current.  Hypertension  This is a chronic problem. The current episode started more than 1 year ago. The problem has been waxing and waning since onset. The problem is uncontrolled. Pertinent negatives include no blurred vision, malaise/fatigue, peripheral edema or shortness of breath. Risk factors for coronary artery disease include dyslipidemia, diabetes mellitus, obesity and sedentary lifestyle. There is no history of kidney disease, CAD/MI or CVA.  Hyperlipidemia  This is a chronic problem. The current episode started more than 1 year ago. The problem is uncontrolled. Recent lipid tests were reviewed and are high. Exacerbating diseases include obesity. Pertinent negatives include no shortness of breath. Current antihyperlipidemic treatment includes diet change. The current treatment provides no improvement of lipids. Risk factors for coronary artery disease include dyslipidemia, diabetes mellitus, female sex,  hypertension, a sedentary lifestyle and post-menopausal.  Osteoporosis  PT takes alendronate. Lase Dexascan was in 2014.     Review of Systems  Constitutional: Negative for malaise/fatigue.  Eyes: Negative for blurred vision.  Respiratory: Negative for shortness of breath.   All other systems reviewed and are negative.      Objective:   Physical Exam Vitals signs reviewed.  Constitutional:      General: She is not in acute distress.    Appearance: She is well-developed.  HENT:     Head: Normocephalic and atraumatic.     Right Ear: External ear normal.  Eyes:     Pupils: Pupils are equal, round, and reactive to light.  Neck:     Musculoskeletal: Normal range of motion and neck supple.     Thyroid: No thyromegaly.  Cardiovascular:     Rate and Rhythm: Tachycardia present. Rhythm irregular.     Heart sounds: Normal heart sounds. No murmur.  Pulmonary:     Effort: Pulmonary effort is normal. No respiratory distress.     Breath sounds: Normal breath sounds. No wheezing.  Abdominal:     General: Bowel sounds are normal. There is no distension.     Palpations: Abdomen is soft.     Tenderness: There is no abdominal tenderness.  Musculoskeletal: Normal range of motion.        General: No tenderness.  Skin:    General: Skin is warm and dry.  Neurological:     Mental Status: She is alert and oriented to person, place, and time.     Cranial Nerves: No cranial nerve deficit.     Deep Tendon Reflexes: Reflexes are normal and symmetric.  Psychiatric:        Behavior: Behavior normal.  Thought Content: Thought content normal.        Judgment: Judgment normal.       BP (!) 163/93    Pulse 79    Temp 97.9 F (36.6 C) (Oral)    Ht 5' 4"  (1.626 m)    Wt 173 lb 3.2 oz (78.6 kg)    BMI 29.73 kg/m      Assessment & Plan:  Sheila Ray comes in today with chief complaint of Medical Management of Chronic Issues and Hypertension   Diagnosis and orders addressed:  1.  Atrial fibrillation, unspecified type (HCC) - CMP14+EGFR - CBC with Differential/Platelet - metoprolol succinate (TOPROL-XL) 50 MG 24 hr tablet; Take 1 tablet (50 mg total) by mouth daily. Take with or immediately following a meal.  Dispense: 90 tablet; Refill: 3  2. Type 2 diabetes mellitus without complication, without long-term current use of insulin (HCC) - CMP14+EGFR - CBC with Differential/Platelet - metFORMIN (GLUCOPHAGE) 1000 MG tablet; Take 0.5 tablets (500 mg total) by mouth daily with breakfast.  Dispense: 90 tablet; Refill: 3 - Bayer DCA Hb A1c Waived - Microalbumin / creatinine urine ratio  3. Hyperlipidemia associated with type 2 diabetes mellitus (HCC) - CMP14+EGFR - CBC with Differential/Platelet  4. Hypertension associated with diabetes ([place]) - CMP14+EGFR - CBC with Differential/Platelet - metoprolol succinate (TOPROL-XL) 50 MG 24 hr tablet; Take 1 tablet (50 mg total) by mouth daily. Take with or immediately following a meal.  Dispense: 90 tablet; Refill: 3  5. Obesity (BMI 30-39.9) - CMP14+EGFR - CBC with Differential/Platelet  6. Primary osteoarthritis of right knee - CMP14+EGFR - CBC with Differential/Platelet  7. Hypothyroidism, unspecified type - CMP14+EGFR - CBC with Differential/Platelet - TSH  8. Osteoporosis, unspecified osteoporosis type, unspecified pathological fracture presence - CMP14+EGFR - CBC with Differential/Platelet - DG [facility] DEXA   Labs pending Health Maintenance reviewed Diet and exercise encouraged  Follow up plan: 2 weeks to recheck HTN   Sheila Dun, FNP

## 2018-10-29 NOTE — Patient Instructions (Signed)
Atrial Fibrillation Atrial fibrillation is a type of irregular or rapid heartbeat (arrhythmia). In atrial fibrillation, the top part of the heart (atria) quivers in a chaotic pattern. This makes the heart unable to pump blood normally. Having atrial fibrillation can increase your risk for other health problems, such as:  Blood can pool in the atria and form clots. If a clot travels to the brain, it can cause a stroke.  The heart muscle may weaken from the irregular blood flow. This can cause heart failure. Atrial fibrillation may start suddenly and stop on its own, or it may become a long-lasting problem. What are the causes? This condition is caused by some heart-related conditions or procedures, including:  High blood pressure. This is the most common cause.  Heart failure.  Heart valve conditions.  Inflammation of the sac that surrounds the heart (pericarditis).  Heart surgery.  Coronary artery disease.  Certain heart rhythm disorders, such as Wolf-Parkinson-White syndrome. Other causes include:  Pneumonia.  Obstructive sleep apnea.  Lung cancer.  Thyroid problems, especially if the thyroid is overactive (hyperthyroidism).  Excessive alcohol or drug use. Sometimes, the cause of this condition is not known. What increases the risk? This condition is more likely to develop in:  Older people.  People who smoke.  People who have diabetes mellitus.  People who are overweight (obese).  Athletes who exercise vigorously.  People who have a family history. What are the signs or symptoms? Symptoms of this condition include:  A feeling that your heart is beating rapidly or irregularly.  A feeling of discomfort or pain in your chest.  Shortness of breath.  Sudden light-headedness or weakness.  Getting tired easily during exercise. In some cases, there are no symptoms. How is this diagnosed? Your health care provider may be able to detect atrial fibrillation when  taking your pulse. If detected, this condition may be diagnosed with:  Electrocardiogram (ECG).  Ambulatory cardiac monitor. This device records your heartbeats for 24 hours or more.  Transthoracic echocardiogram (TTE) to evaluate how blood flows through your heart.  Transesophageal echocardiogram (TEE) to view more detailed images of your heart.  A stress test.  Imaging tests, such as a CT scan or chest X-ray.  Blood tests. How is this treated? This condition may be treated with:  Medicines to slow down the heart rate or bring the heart's rhythm back to normal.  Medicines to prevent blood clots from forming.  Electrical cardioversion. This delivers a low-energy shock to the heart to reset its rhythm.  Ablation. This procedure destroys the part of the heart tissue that sends abnormal signals.  Left atrial appendage occlusion/excision. This seals off a common place in the atria where blood clots can form (left atrial appendage). The goal of treatment is to prevent blood clots from forming and to keep your heart beating at a normal rate and rhythm. Treatment depends on underlying medical conditions and how you feel when you are experiencing fibrillation. Follow these instructions at home: Medicines  Take over-the counter and prescription medicines only as told by your health care provider.  If your health care provider prescribed a blood-thinning medicine (anticoagulant), take it exactly as told. Taking too much blood-thinning medicine can cause bleeding. Taking too little can enable a blood clot to form and travel to the brain, causing a stroke. Lifestyle      Do not use any products that contain nicotine or tobacco, such as cigarettes and e-cigarettes. If you need help quitting, ask your health   care provider.  Do not drink beverages that contain caffeine, such as coffee, soda, and tea.  Follow diet instructions as told by your health care provider.  Exercise regularly as  told by your health care provider.  Do not drink alcohol. General instructions  If you have obstructive sleep apnea, manage your condition as told by your health care provider.  Maintain a healthy weight. Do not use diet pills unless your health care provider approves. Diet pills may make heart problems worse.  Keep all follow-up visits as told by your health care provider. This is important. Contact a health care provider if you:  Notice a change in the rate, rhythm, or strength of your heartbeat.  Are taking an anticoagulant and you notice increased bruising.  Tire more easily when you exercise or exert yourself.  Have a sudden change in weight. Get help right away if you have:   Chest pain, abdominal pain, sweating, or weakness.  Difficulty breathing.  Blood in your vomit, stool (feces), or urine.  Any symptoms of a stroke. "BE FAST" is an easy way to remember the main warning signs of a stroke: ? B - Balance. Signs are dizziness, sudden trouble walking, or loss of balance. ? E - Eyes. Signs are trouble seeing or a sudden change in vision. ? F - Face. Signs are sudden weakness or numbness of the face, or the face or eyelid drooping on one side. ? A - Arms. Signs are weakness or numbness in an arm. This happens suddenly and usually on one side of the body. ? S - Speech. Signs are sudden trouble speaking, slurred speech, or trouble understanding what people say. ? T - Time. Time to call emergency services. Write down what time symptoms started.  Other signs of a stroke, such as: ? A sudden, severe headache with no known cause. ? Nausea or vomiting. ? Seizure. These symptoms may represent a serious problem that is an emergency. Do not wait to see if the symptoms will go away. Get medical help right away. Call your local emergency services (911 in the [place] Do not drive yourself to the hospital. Summary  Atrial fibrillation is a type of irregular or rapid heartbeat  (arrhythmia).  Symptoms include a feeling that your heart is beating fast or irregularly. In some cases, you may not have symptoms.  The condition is treated with medicines to slow down the heart rate or bring the heart's rhythm back to normal. You may also need blood-thinning medicines to prevent blood clots.  Get help right away if you have symptoms or signs of a stroke. This information is not intended to replace advice given to you by your health care provider. Make sure you discuss any questions you have with your health care provider. Document Released: 06/17/2005 Document Revised: 08/08/2017 Document Reviewed: 08/08/2017 Elsevier Interactive Patient Education  2019 [employer]  

## 2018-10-30 LAB — CMP14+EGFR
ALT: 11 IU/L (ref 0–32)
AST: 16 IU/L (ref 0–40)
Albumin/Globulin Ratio: 1.7 (ref 1.2–2.2)
Albumin: 4.2 g/dL (ref 3.7–4.7)
Alkaline Phosphatase: 70 IU/L (ref 39–117)
BUN/Creatinine Ratio: 15 (ref 12–28)
BUN: 12 mg/dL (ref 8–27)
Bilirubin Total: 0.3 mg/dL (ref 0.0–1.2)
CO2: 19 mmol/L — ABNORMAL LOW (ref 20–29)
Calcium: 10 mg/dL (ref 8.7–10.3)
Chloride: 106 mmol/L (ref 96–106)
Creatinine, Ser: 0.82 mg/dL (ref 0.57–1.00)
GFR calc Af Amer: 79 mL/min/{1.73_m2} (ref >59)
GFR calc non Af Amer: 69 mL/min/{1.73_m2} (ref >59)
Globulin, Total: 2.5 g/dL (ref 1.5–4.5)
Glucose: 111 mg/dL — ABNORMAL HIGH (ref 65–99)
Potassium: 4.2 mmol/L (ref 3.5–5.2)
Sodium: 144 mmol/L (ref 134–144)
Total Protein: 6.7 g/dL (ref 6.0–8.5)

## 2018-10-30 LAB — CBC WITH DIFFERENTIAL/PLATELET
Basophils Absolute: 0.1 10*3/uL (ref 0.0–0.2)
Basos: 1 %
EOS (ABSOLUTE): 0.2 10*3/uL (ref 0.0–0.4)
Eos: 3 %
Hematocrit: 42.2 % (ref 34.0–46.6)
Hemoglobin: 13.2 g/dL (ref 11.1–15.9)
Immature Grans (Abs): 0 10*3/uL (ref 0.0–0.1)
Immature Granulocytes: 0 %
Lymphocytes Absolute: 2 10*3/uL (ref 0.7–3.1)
Lymphs: 28 %
MCH: 23.8 pg — ABNORMAL LOW (ref 26.6–33.0)
MCHC: 31.3 g/dL — ABNORMAL LOW (ref 31.5–35.7)
MCV: 76 fL — ABNORMAL LOW (ref 79–97)
Monocytes Absolute: 0.9 10*3/uL (ref 0.1–0.9)
Monocytes: 12 %
Neutrophils Absolute: 4.1 10*3/uL (ref 1.4–7.0)
Neutrophils: 56 %
Platelets: 273 10*3/uL (ref 150–450)
RBC: 5.55 x10E6/uL — ABNORMAL HIGH (ref 3.77–5.28)
RDW: 17 % — ABNORMAL HIGH (ref 11.7–15.4)
WBC: 7.3 10*3/uL (ref 3.4–10.8)

## 2018-10-30 LAB — TSH: TSH: 1.98 u[IU]/mL (ref 0.450–4.500)

## 2018-10-30 LAB — MICROALBUMIN / CREATININE URINE RATIO
Creatinine, Urine: 125.5 mg/dL
Microalb/Creat Ratio: 14 mg/g creat (ref 0–29)
Microalbumin, Urine: 18 ug/mL

## 2018-11-09 ENCOUNTER — Other Ambulatory Visit: Payer: Self-pay

## 2018-11-09 ENCOUNTER — Encounter: Payer: Self-pay | Admitting: Family

## 2018-11-09 ENCOUNTER — Ambulatory Visit (INDEPENDENT_AMBULATORY_CARE_PROVIDER_SITE_OTHER): Payer: Medicare Other | Admitting: Family

## 2018-11-09 VITALS — BP 138/79 | HR 81 | Temp 97.2°F | Ht 64.0 in | Wt 175.0 lb

## 2018-11-09 DIAGNOSIS — E1159 Type 2 diabetes mellitus with other circulatory complications: Secondary | ICD-10-CM | POA: Diagnosis not present

## 2018-11-09 DIAGNOSIS — I152 Hypertension secondary to endocrine disorders: Secondary | ICD-10-CM

## 2018-11-09 DIAGNOSIS — I10 Essential (primary) hypertension: Secondary | ICD-10-CM | POA: Diagnosis not present

## 2018-11-09 NOTE — Progress Notes (Signed)
   Subjective:    Patient ID: Sheila Ray, female    DOB: 10-21-1940, 78 y.o.   MRN: [id number]  Chief Complaint  Patient presents with  . Hypertension    two week recheck   Pt presents to the office today to recheck HTN. Pt's BP is at goal today. We saw each other on 10/29/18 and had increased her metoprolol to 50 mg from 25 mg.  Hypertension  This is a chronic problem. The current episode started more than 1 year ago. The problem has been resolved since onset. The problem is controlled. Pertinent negatives include no anxiety, headaches, malaise/fatigue, peripheral edema or shortness of breath. The current treatment provides moderate improvement. Hypertensive end-organ damage includes CVA. There is no history of kidney disease or CAD/MI.      Review of Systems  Constitutional: Negative for malaise/fatigue.  Respiratory: Negative for shortness of breath.   Neurological: Negative for headaches.  All other systems reviewed and are negative.      Objective:   Physical Exam Vitals signs reviewed.  Constitutional:      General: She is not in acute distress.    Appearance: She is well-developed.  HENT:     Head: Normocephalic and atraumatic.  Neck:     Musculoskeletal: Normal range of motion and neck supple.     Thyroid: No thyromegaly.  Cardiovascular:     Rate and Rhythm: Normal rate and regular rhythm.     Heart sounds: Normal heart sounds. No murmur.  Pulmonary:     Effort: Pulmonary effort is normal. No respiratory distress.     Breath sounds: Normal breath sounds. No wheezing.  Abdominal:     General: Bowel sounds are normal. There is no distension.     Palpations: Abdomen is soft.     Tenderness: There is no abdominal tenderness.  Musculoskeletal: Normal range of motion.        General: No tenderness.  Skin:    General: Skin is warm and dry.  Neurological:     Mental Status: She is alert and oriented to person, place, and time.     Cranial Nerves: No cranial  nerve deficit.     Deep Tendon Reflexes: Reflexes are normal and symmetric.  Psychiatric:        Behavior: Behavior normal.        Thought Content: Thought content normal.        Judgment: Judgment normal.       BP 138/79   Pulse 81   Temp (!) 97.2 F (36.2 C) (Oral)   Ht _0  (1.626 m)   Wt 175 lb (79.4 kg)   BMI 30.04 kg/m      Assessment & Plan:  Sheila Ray comes in today with chief complaint of Hypertension (two week recheck)   Diagnosis and orders addressed:  1. Hypertension associated with diabetes ([place]) -Dash diet information given -Exercise encouraged - Stress Management  -Continue current meds -RTO in 4 months  - BMP8+EGFR   [name], FNP

## 2018-11-09 NOTE — Patient Instructions (Signed)

## 2018-11-10 LAB — BMP8+EGFR
BUN/Creatinine Ratio: 15 (ref 12–28)
BUN: 13 mg/dL (ref 8–27)
CO2: 23 mmol/L (ref 20–29)
Calcium: 9.9 mg/dL (ref 8.7–10.3)
Chloride: 104 mmol/L (ref 96–106)
Creatinine, Ser: 0.86 mg/dL (ref 0.57–1.00)
GFR calc Af Amer: 75 mL/min/{1.73_m2} (ref >59)
GFR calc non Af Amer: 65 mL/min/{1.73_m2} (ref >59)
Glucose: 83 mg/dL (ref 65–99)
Potassium: 4.2 mmol/L (ref 3.5–5.2)
Sodium: 141 mmol/L (ref 134–144)

## 2018-11-12 ENCOUNTER — Ambulatory Visit: Payer: Medicare Other | Admitting: Family

## 2018-12-03 ENCOUNTER — Other Ambulatory Visit: Payer: Self-pay | Admitting: Family

## 2018-12-22 ENCOUNTER — Other Ambulatory Visit: Payer: Self-pay | Admitting: Family

## 2019-01-11 DIAGNOSIS — E119 Type 2 diabetes mellitus without complications: Secondary | ICD-10-CM | POA: Diagnosis not present

## 2019-01-11 DIAGNOSIS — H2513 Age-related nuclear cataract, bilateral: Secondary | ICD-10-CM | POA: Diagnosis not present

## 2019-01-11 LAB — HM DIABETES EYE EXAM

## 2019-01-20 DIAGNOSIS — I361 Nonrheumatic tricuspid (valve) insufficiency: Secondary | ICD-10-CM | POA: Diagnosis not present

## 2019-01-20 DIAGNOSIS — Z8673 Personal history of transient ischemic attack (TIA), and cerebral infarction without residual deficits: Secondary | ICD-10-CM | POA: Diagnosis not present

## 2019-01-20 DIAGNOSIS — I340 Nonrheumatic mitral (valve) insufficiency: Secondary | ICD-10-CM | POA: Diagnosis not present

## 2019-01-20 DIAGNOSIS — I4819 Other persistent atrial fibrillation: Secondary | ICD-10-CM | POA: Diagnosis not present

## 2019-01-20 DIAGNOSIS — Z7901 Long term (current) use of anticoagulants: Secondary | ICD-10-CM | POA: Diagnosis not present

## 2019-01-24 ENCOUNTER — Other Ambulatory Visit: Payer: Self-pay | Admitting: Family

## 2019-02-08 ENCOUNTER — Ambulatory Visit: Payer: Medicare Other | Admitting: Family

## 2019-02-09 ENCOUNTER — Encounter: Payer: Self-pay | Admitting: Family

## 2019-02-23 ENCOUNTER — Other Ambulatory Visit: Payer: Self-pay | Admitting: Family

## 2019-03-11 ENCOUNTER — Other Ambulatory Visit: Payer: Self-pay

## 2019-03-12 ENCOUNTER — Encounter: Payer: Self-pay | Admitting: Family

## 2019-03-12 ENCOUNTER — Ambulatory Visit (INDEPENDENT_AMBULATORY_CARE_PROVIDER_SITE_OTHER): Payer: Medicare Other | Admitting: Family

## 2019-03-12 VITALS — BP 137/77 | HR 65 | Temp 97.5°F | Ht 64.0 in | Wt 179.2 lb

## 2019-03-12 DIAGNOSIS — I152 Hypertension secondary to endocrine disorders: Secondary | ICD-10-CM

## 2019-03-12 DIAGNOSIS — I4891 Unspecified atrial fibrillation: Secondary | ICD-10-CM

## 2019-03-12 DIAGNOSIS — E1159 Type 2 diabetes mellitus with other circulatory complications: Secondary | ICD-10-CM

## 2019-03-12 DIAGNOSIS — Z23 Encounter for immunization: Secondary | ICD-10-CM

## 2019-03-12 DIAGNOSIS — E1169 Type 2 diabetes mellitus with other specified complication: Secondary | ICD-10-CM

## 2019-03-12 DIAGNOSIS — E039 Hypothyroidism, unspecified: Secondary | ICD-10-CM | POA: Diagnosis not present

## 2019-03-12 DIAGNOSIS — E669 Obesity, unspecified: Secondary | ICD-10-CM

## 2019-03-12 DIAGNOSIS — I10 Essential (primary) hypertension: Secondary | ICD-10-CM | POA: Diagnosis not present

## 2019-03-12 DIAGNOSIS — E785 Hyperlipidemia, unspecified: Secondary | ICD-10-CM

## 2019-03-12 DIAGNOSIS — M1711 Unilateral primary osteoarthritis, right knee: Secondary | ICD-10-CM

## 2019-03-12 DIAGNOSIS — E559 Vitamin D deficiency, unspecified: Secondary | ICD-10-CM

## 2019-03-12 LAB — BAYER DCA HB A1C WAIVED: HB A1C (BAYER DCA - WAIVED): 6.4 % (ref <7.0)

## 2019-03-12 NOTE — Progress Notes (Addendum)
Subjective:    Patient ID: Sheila Ray, female    DOB: 1941/01/23, 78 y.o.   MRN: [id number]  Chief Complaint  Patient presents with  . Medical Management of Chronic Issues   Pt presents to the office today for chronic follow up. Pt is followed by Cardiologists annually  for A Fib. Pt is taking Xarelto daily and states this is doing well. Hypertension This is a chronic problem. The current episode started more than 1 year ago. The problem has been resolved since onset. The problem is controlled. Pertinent negatives include no blurred vision, headaches, malaise/fatigue, peripheral edema or shortness of breath. Risk factors for coronary artery disease include dyslipidemia, diabetes mellitus and sedentary lifestyle. The current treatment provides moderate improvement. There is no history of kidney disease or heart failure. Identifiable causes of hypertension include a thyroid problem.  Diabetes She presents for her follow-up diabetic visit. She has type 2 diabetes mellitus. Her disease course has been stable. There are no hypoglycemic associated symptoms. Pertinent negatives for hypoglycemia include no headaches. Pertinent negatives for diabetes include no blurred vision, no fatigue and no foot paresthesias. There are no hypoglycemic complications. Symptoms are stable. There are no diabetic complications. Pertinent negatives for diabetic complications include no heart disease, nephropathy or peripheral neuropathy. Risk factors for coronary artery disease include dyslipidemia, diabetes mellitus, family history, hypertension, sedentary lifestyle and post-menopausal. She is following a generally healthy diet. (Does not check her BS regularly at home) Eye exam is current.  Hyperlipidemia This is a chronic problem. The current episode started more than 1 year ago. The problem is uncontrolled. Recent lipid tests were reviewed and are high. Exacerbating diseases include obesity. Pertinent negatives include  no shortness of breath. Current antihyperlipidemic treatment includes statins. The current treatment provides moderate improvement of lipids. Risk factors for coronary artery disease include dyslipidemia, female sex, hypertension, a sedentary lifestyle, post-menopausal and diabetes mellitus.  Thyroid Problem Presents for follow-up visit. Patient reports no constipation, depressed mood, dry skin or fatigue. The symptoms have been stable. Her past medical history is significant for hyperlipidemia. There is no history of heart failure.  Arthritis Presents for follow-up visit. She complains of pain and stiffness. The symptoms have been stable. Affected locations include the right MCP, left MCP, right knee and left knee. Her pain is at a severity of 2/10. Pertinent negatives include no fatigue.      Review of Systems  Constitutional: Negative for fatigue and malaise/fatigue.  Eyes: Negative for blurred vision.  Respiratory: Negative for shortness of breath.   Gastrointestinal: Negative for constipation.  Musculoskeletal: Positive for arthritis and stiffness.  Neurological: Negative for headaches.  All other systems reviewed and are negative.      Objective:   Physical Exam Vitals signs reviewed.  Constitutional:      General: She is not in acute distress.    Appearance: She is well-developed.  HENT:     Head: Normocephalic and atraumatic.     Right Ear: Tympanic membrane normal.     Left Ear: Tympanic membrane normal.  Eyes:     Pupils: Pupils are equal, round, and reactive to light.  Neck:     Musculoskeletal: Normal range of motion and neck supple.     Thyroid: No thyromegaly.  Cardiovascular:     Rate and Rhythm: Normal rate and regular rhythm.     Heart sounds: Normal heart sounds. No murmur.  Pulmonary:     Effort: Pulmonary effort is normal. No respiratory distress.  Breath sounds: Normal breath sounds. No wheezing.  Abdominal:     General: Bowel sounds are normal. There  is no distension.     Palpations: Abdomen is soft.     Tenderness: There is no abdominal tenderness.  Musculoskeletal: Normal range of motion.        General: No tenderness.     Right lower leg: Edema (trace) present.     Left lower leg: Edema (trace) present.  Skin:    General: Skin is warm and dry.  Neurological:     Mental Status: She is alert and oriented to person, place, and time.     Cranial Nerves: No cranial nerve deficit.     Deep Tendon Reflexes: Reflexes are normal and symmetric.  Psychiatric:        Behavior: Behavior normal.        Thought Content: Thought content normal.        Judgment: Judgment normal.        BP 137/77   Pulse 65   Temp (!) 97.5 F (36.4 C) (Oral)   Ht 5' 4"  (1.626 m)   Wt 179 lb 3.2 oz (81.3 kg)   BMI 30.76 kg/m   Assessment & Plan:  Sheila Ray comes in today with chief complaint of Medical Management of Chronic Issues   Diagnosis and orders addressed:  1. Atrial fibrillation, unspecified type ([place]) - CBC with Differential/Platelet - CMP14+EGFR  2. Hypertension associated with diabetes ([place]) - CBC with Differential/Platelet - CMP14+EGFR  3. Type 2 diabetes mellitus with other specified complication, without long-term current use of insulin ([facility]) - CBC with Differential/Platelet - CMP14+EGFR - Bayer DCA Hb A1c Waived  4. Hyperlipidemia associated with type 2 diabetes mellitus ([place]) - CBC with Differential/Platelet - CMP14+EGFR  5. Hypothyroidism, unspecified type - CBC with Differential/Platelet - CMP14+EGFR - TSH  6. Primary osteoarthritis of right knee - CBC with Differential/Platelet - CMP14+EGFR  7. Obesity (BMI 30-39.9) - CBC with Differential/Platelet - CMP14+EGFR  8. Vitamin D deficiency - CBC with Differential/Platelet - CMP14+EGFR  9. Need for immunization against influenza - Flu Vaccine QUAD High Dose(Fluad)   Labs pending Health Maintenance reviewed Diet and exercise encouraged  Follow up  plan: 6 months    Sheila Dun, FNP

## 2019-03-12 NOTE — Patient Instructions (Signed)
Health Maintenance After Age 78 After age 78, you are at a higher risk for certain long-term diseases and infections as well as injuries from falls. Falls are a major cause of broken bones and head injuries in people who are older than age 78. Getting regular preventive care can help to keep you healthy and well. Preventive care includes getting regular testing and making lifestyle changes as recommended by your health care provider. Talk with your health care provider about:  Which screenings and tests you should have. A screening is a test that checks for a disease when you have no symptoms.  A diet and exercise plan that is right for you. What should I know about screenings and tests to prevent falls? Screening and testing are the best ways to find a health problem early. Early diagnosis and treatment give you the best chance of managing medical conditions that are common after age 78. Certain conditions and lifestyle choices may make you more likely to have a fall. Your health care provider may recommend:  Regular vision checks. Poor vision and conditions such as cataracts can make you more likely to have a fall. If you wear glasses, make sure to get your prescription updated if your vision changes.  Medicine review. Work with your health care provider to regularly review all of the medicines you are taking, including over-the-counter medicines. Ask your health care provider about any side effects that may make you more likely to have a fall. Tell your health care provider if any medicines that you take make you feel dizzy or sleepy.  Osteoporosis screening. Osteoporosis is a condition that causes the bones to get weaker. This can make the bones weak and cause them to break more easily.  Blood pressure screening. Blood pressure changes and medicines to control blood pressure can make you feel dizzy.  Strength and balance checks. Your health care provider may recommend certain tests to check your  strength and balance while standing, walking, or changing positions.  Foot health exam. Foot pain and numbness, as well as not wearing proper footwear, can make you more likely to have a fall.  Depression screening. You may be more likely to have a fall if you have a fear of falling, feel emotionally low, or feel unable to do activities that you used to do.  Alcohol use screening. Using too much alcohol can affect your balance and may make you more likely to have a fall. What actions can I take to lower my risk of falls? General instructions  Talk with your health care provider about your risks for falling. Tell your health care provider if: ? You fall. Be sure to tell your health care provider about all falls, even ones that seem minor. ? You feel dizzy, sleepy, or off-balance.  Take over-the-counter and prescription medicines only as told by your health care provider. These include any supplements.  Eat a healthy diet and maintain a healthy weight. A healthy diet includes low-fat dairy products, low-fat (lean) meats, and fiber from whole grains, beans, and lots of fruits and vegetables. Home safety  Remove any tripping hazards, such as rugs, cords, and clutter.  Install safety equipment such as grab bars in bathrooms and safety rails on stairs.  Keep rooms and walkways well-lit. Activity   Follow a regular exercise program to stay fit. This will help you maintain your balance. Ask your health care provider what types of exercise are appropriate for you.  If you need a cane or   walker, use it as recommended by your health care provider.  Wear supportive shoes that have nonskid soles. Lifestyle  Do not drink alcohol if your health care provider tells you not to drink.  If you drink alcohol, limit how much you have: ? 0-1 drink a day for women. ? 0-2 drinks a day for men.  Be aware of how much alcohol is in your drink. In the [place] one drink equals one typical bottle of beer (12  oz), one-half glass of wine (5 oz), or one shot of hard liquor (1 oz).  Do not use any products that contain nicotine or tobacco, such as cigarettes and e-cigarettes. If you need help quitting, ask your health care provider. Summary  Having a healthy lifestyle and getting preventive care can help to protect your health and wellness after age 78.  Screening and testing are the best way to find a health problem early and help you avoid having a fall. Early diagnosis and treatment give you the best chance for managing medical conditions that are more common for people who are older than age 78.  Falls are a major cause of broken bones and head injuries in people who are older than age 78. Take precautions to prevent a fall at home.  Work with your health care provider to learn what changes you can make to improve your health and wellness and to prevent falls. This information is not intended to replace advice given to you by your health care provider. Make sure you discuss any questions you have with your health care provider. Document Released: 04/30/2017 Document Revised: 10/08/2018 Document Reviewed: 04/30/2017 Elsevier Patient Education  2020 [employer]  

## 2019-03-13 LAB — CBC WITH DIFFERENTIAL/PLATELET
Basophils Absolute: 0.1 10*3/uL (ref 0.0–0.2)
Basos: 1 %
EOS (ABSOLUTE): 0.2 10*3/uL (ref 0.0–0.4)
Eos: 3 %
Hematocrit: 39.6 % (ref 34.0–46.6)
Hemoglobin: 12.2 g/dL (ref 11.1–15.9)
Immature Grans (Abs): 0 10*3/uL (ref 0.0–0.1)
Immature Granulocytes: 0 %
Lymphocytes Absolute: 1.7 10*3/uL (ref 0.7–3.1)
Lymphs: 22 %
MCH: 25.4 pg — ABNORMAL LOW (ref 26.6–33.0)
MCHC: 30.8 g/dL — ABNORMAL LOW (ref 31.5–35.7)
MCV: 82 fL (ref 79–97)
Monocytes Absolute: 0.8 10*3/uL (ref 0.1–0.9)
Monocytes: 10 %
Neutrophils Absolute: 5.1 10*3/uL (ref 1.4–7.0)
Neutrophils: 64 %
Platelets: 261 10*3/uL (ref 150–450)
RBC: 4.81 x10E6/uL (ref 3.77–5.28)
RDW: 15.8 % — ABNORMAL HIGH (ref 11.7–15.4)
WBC: 7.9 10*3/uL (ref 3.4–10.8)

## 2019-03-13 LAB — CMP14+EGFR
ALT: 10 IU/L (ref 0–32)
AST: 12 IU/L (ref 0–40)
Albumin/Globulin Ratio: 2 (ref 1.2–2.2)
Albumin: 4.3 g/dL (ref 3.7–4.7)
Alkaline Phosphatase: 65 IU/L (ref 39–117)
BUN/Creatinine Ratio: 17 (ref 12–28)
BUN: 15 mg/dL (ref 8–27)
Bilirubin Total: 0.3 mg/dL (ref 0.0–1.2)
CO2: 20 mmol/L (ref 20–29)
Calcium: 9.6 mg/dL (ref 8.7–10.3)
Chloride: 106 mmol/L (ref 96–106)
Creatinine, Ser: 0.87 mg/dL (ref 0.57–1.00)
GFR calc Af Amer: 74 mL/min/{1.73_m2} (ref >59)
GFR calc non Af Amer: 64 mL/min/{1.73_m2} (ref >59)
Globulin, Total: 2.2 g/dL (ref 1.5–4.5)
Glucose: 117 mg/dL — ABNORMAL HIGH (ref 65–99)
Potassium: 4.4 mmol/L (ref 3.5–5.2)
Sodium: 143 mmol/L (ref 134–144)
Total Protein: 6.5 g/dL (ref 6.0–8.5)

## 2019-03-13 LAB — TSH: TSH: 1.39 u[IU]/mL (ref 0.450–4.500)

## 2019-03-14 ENCOUNTER — Other Ambulatory Visit: Payer: Self-pay | Admitting: Family

## 2019-03-14 DIAGNOSIS — M810 Age-related osteoporosis without current pathological fracture: Secondary | ICD-10-CM

## 2019-03-18 ENCOUNTER — Other Ambulatory Visit: Payer: Self-pay | Admitting: Family

## 2019-04-13 ENCOUNTER — Ambulatory Visit (INDEPENDENT_AMBULATORY_CARE_PROVIDER_SITE_OTHER): Payer: Medicare Other | Admitting: *Deleted

## 2019-04-13 ENCOUNTER — Other Ambulatory Visit: Payer: Self-pay

## 2019-04-13 DIAGNOSIS — Z0000 Encounter for general adult medical examination without abnormal findings: Secondary | ICD-10-CM | POA: Diagnosis not present

## 2019-04-13 NOTE — Progress Notes (Addendum)
MEDICARE ANNUAL WELLNESS VISIT  04/13/2019  Telephone Visit Disclaimer This Medicare AWV was conducted by telephone due to national recommendations for restrictions regarding the COVID-19 Pandemic (e.g. social distancing).  I verified, using two identifiers, that I am speaking with Sheila Ray or their authorized healthcare agent. I discussed the limitations, risks, security, and privacy concerns of performing an evaluation and management service by telephone and the potential availability of an in-person appointment in the future. The patient expressed understanding and agreed to proceed.   Subjective:  Sheila Ray is a 78 y.o. female patient of Hawks, Theador Hawthorne, FNP who had a Medicare Annual Wellness Visit today via telephone. Timia is Retired and lives alone. she has 1 child.  she reports that she is socially active and does interact with friends/family regularly. she is not  physically active in exercise but stays on the go with friends traveling and shopping.    Advanced Directives 04/13/2019 02/06/2018 01/03/2017 12/06/2014 09/01/2014 07/25/2014 07/25/2014  Does Patient Have a Medical Advance Directive? No No No No No No (No Data)  Would patient like information on creating a medical advance directive? No - Patient declined No - Patient declined Yes (MAU/Ambulatory/Procedural Areas - Information given) Yes - Educational materials given - Yes - Educational materials given -    Hospital Utilization Over the Past 12 Months: # of hospitalizations or ER visits: 0 # of surgeries: 0  Review of Systems    Patient reports that her overall health is unchanged compared to last year.  History obtained from chart review and the patient  Patient Reported Readings (BP, Pulse, CBG, Weight, etc) none  Pain Assessment Pain : No/denies pain     Current Medications & Allergies (verified) Allergies as of 04/13/2019      Reactions   Crestor [rosuvastatin]    Cramps   Lipitor  [atorvastatin]    Cramps   Pravastatin Other (See Comments)   Cramps    Keflex [cephalexin] Hives      Medication List       Accurate as of April 13, 2019  9:57 AM. If you have any questions, ask your nurse or doctor.        STOP taking these medications   ferrous sulfate 325 (65 FE) MG tablet     TAKE these medications   alendronate 70 MG tablet Commonly known as: FOSAMAX TAKE 1 TABLET BY MOUTH  WEEKLY AS DIRECTED   diltiazem 240 MG 24 hr capsule Commonly known as: CARDIZEM CD TAKE 1 CAPSULE BY MOUTH  DAILY   glucose blood test strip Commonly known as: ONE TOUCH ULTRA TEST USE TO CHECK BLOOD GLUCOSE  ONCE A DAY AS INSTRUCTED   lisinopril 40 MG tablet Commonly known as: ZESTRIL TAKE 1 TABLET BY MOUTH  DAILY   metFORMIN 1000 MG tablet Commonly known as: GLUCOPHAGE Take 0.5 tablets (500 mg total) by mouth daily with breakfast.   metoprolol succinate 50 MG 24 hr tablet Commonly known as: TOPROL-XL Take 1 tablet (50 mg total) by mouth daily. Take with or immediately following a meal.   OneTouch Delica Lancets 99991111 Misc 1 each by Does not apply route daily. Use to check BG daily   Vitamin B12 1000 MCG Tbcr Take by mouth.   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, small vessel   Vitamin D deficiency    Past Surgical History:  Procedure Laterality Date   ABDOMINAL HYSTERECTOMY  1985   FOOT SURGERY Right 08/2002     right knee arthroscopy   2013    TONSILLECTOMY     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   Procedure: RIGHT TOTAL KNEE ARTHROPLASTY;  Surgeon: Gearlean Alf, MD;  Location: [facility];  Service: Orthopedics;  Laterality: Right;   Family History  Problem Relation Age of Onset   Osteoporosis Mother    Alzheimer's disease Mother 12   Arthritis Mother    Cancer Father        Lung   Arthritis Sister    Obesity Sister    Heart attack Brother 29   Heart disease Son    Arthritis Brother    Arthritis Sister    Cancer Sister        breast cancer   Social History   Socioeconomic History   Marital status: Divorced    Spouse name: Not on file   Number of children: Not on file   Years of education: Not on file   Highest education level: 12th grade  Occupational History   Occupation: In [place]: 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  Social Needs   Financial resource strain: Not hard at all   Food insecurity    Worry: Never true    Inability: Never true   Transportation needs    Medical: No    Non-medical: No  Tobacco Use   Smoking status: Never Smoker   Smokeless tobacco: Never Used  Substance and Sexual Activity   Alcohol use: No   Drug use: No   Sexual activity: Not Currently  Lifestyle   Physical activity    Days per week: 3 days    Minutes per session: 30 min   Stress: Not at all  Relationships   Social connections    Talks on phone: More than three times a week    Gets together: More than three times a week    Attends religious service: More than 4 times per year    Active member of club or organization: No    Attends meetings of clubs or organizations: Never    Relationship status: Divorced  Other Topics Concern   Not on file  Social History Narrative   Not on file    Activities of Daily Living In your present state of health, do you have any difficulty performing the following activities: 04/13/2019  Hearing? N   Vision? N  Difficulty concentrating or making decisions? N  Walking or climbing stairs? N  Dressing or bathing? N  Doing errands, shopping? N  Preparing Food and eating ? N  Using the Toilet? N  In the past six months, have you accidently leaked urine? N  Do you have problems with loss of bowel control? N  Managing your Medications? N  Managing your Finances? N  Housekeeping or managing  your Housekeeping? N  Some recent data might be hidden    Patient Education/ Literacy How often do you need to have someone help you when you read instructions, pamphlets, or other written materials from your doctor or pharmacy?: 1 - Never What is the last grade level you completed in school?: 12  Exercise Current Exercise Habits: The patient does not participate in regular exercise at present  Diet Patient reports consuming 3 meals a day and 1 snack(s) a day Patient reports that her primary diet is: Regular Patient reports that she does have regular access to food.   Depression Screen PHQ 2/9 Scores 04/13/2019 03/12/2019 11/09/2018 10/29/2018 03/20/2018 02/06/2018 02/04/2018  PHQ - 2 Score 0 0 0 0 0 0 0     Fall Risk Fall Risk  04/13/2019 03/12/2019 11/09/2018 10/29/2018 03/20/2018  Falls in the past year? 0 0 0 0 Yes  Number falls in past yr: - - - - 1  Injury with Fall? - - - - -     Objective:  Shell L Petron seemed alert and oriented and she participated appropriately during our telephone visit.  Blood Pressure Weight BMI  BP Readings from Last 3 Encounters:  03/12/19 137/77  11/09/18 138/79  10/29/18 (!) 163/93   Wt Readings from Last 3 Encounters:  03/12/19 179 lb 3.2 oz (81.3 kg)  11/09/18 175 lb (79.4 kg)  10/29/18 173 lb 3.2 oz (78.6 kg)   BMI Readings from Last 1 Encounters:  03/12/19 30.76 kg/m    *Unable to obtain current vital signs, weight, and BMI due to telephone visit type  Hearing/Vision   Wanna did not seem to have difficulty with hearing/understanding during the  telephone conversation  Reports that she has had a formal eye exam by an eye care professional within the past year  Reports that she has not had a formal hearing evaluation within the past year *Unable to fully assess hearing and vision during telephone visit type  Cognitive Function: 6CIT Screen 04/13/2019  What Year? 0 points  What month? 0 points  What time? 0 points  Count back from 20 0 points  Months in reverse 2 points  Repeat phrase 0 points  Total Score 2   (Normal:0-7, Significant for Dysfunction: >8)  Normal Cognitive Function Screening: Yes   Immunization & Health Maintenance Record Immunization History  Administered Date(s) Administered   Fluad Quad(high Dose 65+) 03/12/2019   Influenza Whole 05/16/2012   Influenza, High Dose Seasonal PF 05/05/2017, 05/27/2018   Influenza,inj,Quad PF,6+ Mos 03/01/2013, 04/06/2014, 05/19/2015   Pneumococcal Conjugate-13 12/06/2014   Pneumococcal Polysaccharide-23 11/29/2005   Td 09/14/2002, 04/29/2015   Zoster 04/17/2011   Zoster Recombinat (Shingrix) 02/06/2018, 02/06/2018, 03/20/2018    Health Maintenance  Topic Date Due   MAMMOGRAM  05/08/2019   HEMOGLOBIN A1C  09/09/2019   FOOT EXAM  10/29/2019   OPHTHALMOLOGY EXAM  01/11/2020   TETANUS/TDAP  04/28/2025   INFLUENZA VACCINE  Completed   DEXA SCAN  Completed   PNA vac Low Risk Adult  Completed       Assessment  This is a routine wellness examination for Sheila Ray.  Health Maintenance: Due or Overdue There are no preventive care reminders to display for this patient.  Rashmi L Hogle does not need a referral for Community Assistance: Care Management:   no Social Work:    no Prescription Assistance:  no Nutrition/Diabetes Education:  no   Plan:  Personalized Goals Goals Addressed  This Visit's Progress    COMPLETED: DIET - EAT MORE FRUITS AND VEGETABLES       Exercise 3x per week (30 min per time)   On track    Walk  for at least 30 minutes 3 times per week      Personalized Health Maintenance & Screening Recommendations  Up to date on all Health Maintenance   Lung Cancer Screening Recommended: no (Low Dose CT Chest recommended if Age 10-80 years, 30 pack-year currently smoking OR have quit w/in past 15 years) Hepatitis C Screening recommended: no HIV Screening recommended: no  Advanced Directives: Written information was not prepared per patient's request.  Referrals & Orders No orders of the defined types were placed in this encounter.   Follow-up Plan  Follow-up with Sharion Balloon, FNP as planned.  Pt is a very pleasant lady who is very independent and active socially. She is up to date on all Health Maintenance. Pt will continue with her goal of walking at least 30 min 3x week. She feels she has completed last years goal of eating more fruits and vegetables. She has a mammogram scheduled before the end of the year.   I have personally reviewed and noted the following in the patients chart:    Medical and social history  Use of alcohol, tobacco or illicit drugs   Current medications and supplements  Functional ability and status  Nutritional status  Physical activity  Advanced directives  List of other physicians  Hospitalizations, surgeries, and ER visits in previous 12 months  Vitals  Screenings to include cognitive, depression, and falls  Referrals and appointments  In addition, I have reviewed and discussed with Callyn L Grabski certain preventive protocols, quality metrics, and best practice recommendations. A written personalized care plan for preventive services as well as general preventive health recommendations is available and can be mailed to the patient at her request.      Faylene Million Charmaine,LPN  [phone]    I have reviewed and agree with the above AWV documentation.   [name], FNP

## 2019-05-17 ENCOUNTER — Other Ambulatory Visit: Payer: Self-pay

## 2019-05-17 NOTE — Patient Outreach (Signed)
[facility]) Care Management  05/17/2019  Sheila Ray [birth year] [id number]   Medication Adherence call to Mrs. Sheila Ray HIPPA Compliant Voice message left with a call back number. Mrs. Sheila Ray is showing past due on Lisinopril 40 mg under [place].   [facility] Management Direct Dial [phone]  Fax [phone] [email or web address]

## 2019-05-18 ENCOUNTER — Telehealth: Payer: Self-pay | Admitting: Family

## 2019-05-18 NOTE — Chronic Care Management (AMB) (Signed)
Chronic Care Management   Note  05/18/2019 Name: JUDEE HENNICK MRN: [id number] DOB: 1940-12-31  Maygen L Tonnesen is a 78 y.o. year old female who is a primary care patient of Sharion Balloon, FNP. I reached out to [place] by phone today in response to a referral sent by Ms. Jaylah L Vanderschaaf's health plan.     Ms. Weyenberg was given information about Chronic Care Management services today including:  1. CCM service includes personalized support from designated clinical staff supervised by her physician, including individualized plan of care and coordination with other care providers 2. 24/7 contact phone numbers for assistance for urgent and routine care needs. 3. Service will only be billed when office clinical staff spend 20 minutes or more in a month to coordinate care. 4. Only one practitioner may furnish and bill the service in a calendar month. 5. The patient may stop CCM services at any time (effective at the end of the month) by phone call to the office staff. 6. The patient will be responsible for cost sharing (co-pay) of up to 20% of the service fee (after annual deductible is met).  Patient agreed to services and verbal consent obtained.   Follow up plan: Telephone appointment with CCM team member scheduled for: 06/01/2019  [place], [place] [facility]  [place], [place] [address] Direct Dial: [email or web address]  Website: [email or web address]

## 2019-06-01 ENCOUNTER — Ambulatory Visit: Payer: Medicare Other | Admitting: *Deleted

## 2019-06-01 DIAGNOSIS — E1169 Type 2 diabetes mellitus with other specified complication: Secondary | ICD-10-CM

## 2019-06-01 DIAGNOSIS — I10 Essential (primary) hypertension: Secondary | ICD-10-CM

## 2019-06-01 DIAGNOSIS — I152 Hypertension secondary to endocrine disorders: Secondary | ICD-10-CM

## 2019-06-01 DIAGNOSIS — E1159 Type 2 diabetes mellitus with other circulatory complications: Secondary | ICD-10-CM

## 2019-06-01 NOTE — Chronic Care Management (AMB) (Addendum)
  Chronic Care Management   Outreach Note  06/01/2019 Name: Sheila Ray MRN: [id number] DOB: 11-05-1940  Referred by: Sharion Balloon, FNP Reason for referral : Chronic Care Management (RN Initial Outreach)  Telephone outreach unsuccessful for Initial CCM visit.    Follow Up Plan: The care management team will reach out to the patient again over the next 30 days.   Chong Sicilian, BSN, RN-BC Embedded Chronic Care Manager [facility] / [place] Management Direct Dial: [phone]

## 2019-06-11 DIAGNOSIS — Z1231 Encounter for screening mammogram for malignant neoplasm of breast: Secondary | ICD-10-CM | POA: Diagnosis not present

## 2019-07-08 ENCOUNTER — Telehealth: Payer: Medicare Other

## 2019-07-12 ENCOUNTER — Ambulatory Visit: Payer: Medicare Other | Admitting: *Deleted

## 2019-07-12 DIAGNOSIS — G9389 Other specified disorders of brain: Secondary | ICD-10-CM | POA: Diagnosis not present

## 2019-07-12 DIAGNOSIS — I10 Essential (primary) hypertension: Secondary | ICD-10-CM | POA: Diagnosis not present

## 2019-07-12 DIAGNOSIS — W06XXXA Fall from bed, initial encounter: Secondary | ICD-10-CM | POA: Diagnosis not present

## 2019-07-12 DIAGNOSIS — R519 Headache, unspecified: Secondary | ICD-10-CM | POA: Diagnosis not present

## 2019-07-12 DIAGNOSIS — S0990XA Unspecified injury of head, initial encounter: Secondary | ICD-10-CM | POA: Diagnosis not present

## 2019-07-12 DIAGNOSIS — Z7984 Long term (current) use of oral hypoglycemic drugs: Secondary | ICD-10-CM | POA: Diagnosis not present

## 2019-07-12 DIAGNOSIS — Z888 Allergy status to other drugs, medicaments and biological substances status: Secondary | ICD-10-CM | POA: Diagnosis not present

## 2019-07-12 DIAGNOSIS — Z043 Encounter for examination and observation following other accident: Secondary | ICD-10-CM | POA: Diagnosis not present

## 2019-07-12 DIAGNOSIS — Z9660 Presence of unspecified orthopedic joint implant: Secondary | ICD-10-CM | POA: Diagnosis not present

## 2019-07-12 DIAGNOSIS — Z8673 Personal history of transient ischemic attack (TIA), and cerebral infarction without residual deficits: Secondary | ICD-10-CM | POA: Diagnosis not present

## 2019-07-12 DIAGNOSIS — Z881 Allergy status to other antibiotic agents status: Secondary | ICD-10-CM | POA: Diagnosis not present

## 2019-07-12 DIAGNOSIS — Z7901 Long term (current) use of anticoagulants: Secondary | ICD-10-CM | POA: Diagnosis not present

## 2019-07-12 DIAGNOSIS — E119 Type 2 diabetes mellitus without complications: Secondary | ICD-10-CM | POA: Diagnosis not present

## 2019-07-12 DIAGNOSIS — S0001XA Abrasion of scalp, initial encounter: Secondary | ICD-10-CM | POA: Diagnosis not present

## 2019-07-12 DIAGNOSIS — I4891 Unspecified atrial fibrillation: Secondary | ICD-10-CM | POA: Diagnosis not present

## 2019-07-12 DIAGNOSIS — Z79899 Other long term (current) drug therapy: Secondary | ICD-10-CM | POA: Diagnosis not present

## 2019-07-12 NOTE — Chronic Care Management (AMB) (Addendum)
  Chronic Care Management   Outreach Note  07/12/2019 Name: Sheila Ray MRN: [id number] DOB: [birth year]  Referred by: Sharion Balloon, FNP Reason for referral : Chronic Care Management (CCM Initial Visit (2nd attempt))   A second unsuccessful telephone initial outreach was attempted today. The patient was referred to the case management team for assistance with care management and care coordination.   Follow Up Plan: A HIPPA compliant phone message was left for the patient providing contact information and requesting a return call.  The care management team will reach out to the patient again over the next 30 days.   Chong Sicilian, BSN, RN-BC Embedded Chronic Care Manager [facility] / [place] Management Direct Dial: [phone]   I have reviewed and agree with the above documentation.   [name], FNP

## 2019-08-10 ENCOUNTER — Ambulatory Visit: Payer: Medicare Other | Admitting: *Deleted

## 2019-08-10 NOTE — Patient Instructions (Signed)
  Follow Up Plan: A HIPPA compliant phone message was left for the patient providing contact information and requesting a return call.  The patient has been provided with contact information for the care management team and has been advised to call with any health related questions or concerns.   The CCM team will remain available for services at the patient or PCPs request.   Chong Sicilian, BSN, RN-BC [place] / [place] Management Direct Dial: [phone]

## 2019-08-10 NOTE — Chronic Care Management (AMB) (Addendum)
  Chronic Care Management   Outreach Note  08/10/2019 Name: Sheila Ray MRN: [id number] DOB: 12-06-1940  Referred by: Sharion Balloon, FNP Reason for referral : Chronic Care Management (RN Initial Outreach)   A 3rd and final unsuccessful telephone outreach was attempted today. The patient was referred to the case management team for assistance with care management and care coordination.   Follow Up Plan: A HIPPA compliant phone message was left for the patient providing contact information and requesting a return call.  The patient has been provided with contact information for the care management team and has been advised to call with any health related questions or concerns.   The CCM team will remain available for services at the patient or PCPs request.   Chong Sicilian, BSN, RN-BC [place] / [place] Management Direct Dial: [phone]   I have reviewed and agree with the above documentation.   [name], FNP

## 2019-08-12 DIAGNOSIS — I10 Essential (primary) hypertension: Secondary | ICD-10-CM | POA: Diagnosis not present

## 2019-08-12 DIAGNOSIS — E1169 Type 2 diabetes mellitus with other specified complication: Secondary | ICD-10-CM | POA: Diagnosis not present

## 2019-08-12 DIAGNOSIS — I4819 Other persistent atrial fibrillation: Secondary | ICD-10-CM | POA: Diagnosis not present

## 2019-08-12 DIAGNOSIS — E785 Hyperlipidemia, unspecified: Secondary | ICD-10-CM | POA: Diagnosis not present

## 2019-09-09 ENCOUNTER — Encounter: Payer: Self-pay | Admitting: Family

## 2019-09-09 ENCOUNTER — Ambulatory Visit: Payer: Medicare Other | Admitting: Family

## 2019-09-16 ENCOUNTER — Ambulatory Visit (INDEPENDENT_AMBULATORY_CARE_PROVIDER_SITE_OTHER): Payer: Medicare Other | Admitting: Family

## 2019-09-16 ENCOUNTER — Other Ambulatory Visit: Payer: Self-pay

## 2019-09-16 ENCOUNTER — Encounter: Payer: Self-pay | Admitting: Family

## 2019-09-16 VITALS — BP 129/74 | HR 88 | Temp 95.0°F | Ht 64.0 in | Wt 182.6 lb

## 2019-09-16 DIAGNOSIS — E785 Hyperlipidemia, unspecified: Secondary | ICD-10-CM | POA: Diagnosis not present

## 2019-09-16 DIAGNOSIS — I4891 Unspecified atrial fibrillation: Secondary | ICD-10-CM

## 2019-09-16 DIAGNOSIS — E669 Obesity, unspecified: Secondary | ICD-10-CM

## 2019-09-16 DIAGNOSIS — M810 Age-related osteoporosis without current pathological fracture: Secondary | ICD-10-CM | POA: Diagnosis not present

## 2019-09-16 DIAGNOSIS — I10 Essential (primary) hypertension: Secondary | ICD-10-CM | POA: Diagnosis not present

## 2019-09-16 DIAGNOSIS — E1169 Type 2 diabetes mellitus with other specified complication: Secondary | ICD-10-CM | POA: Diagnosis not present

## 2019-09-16 DIAGNOSIS — E1159 Type 2 diabetes mellitus with other circulatory complications: Secondary | ICD-10-CM | POA: Diagnosis not present

## 2019-09-16 DIAGNOSIS — E039 Hypothyroidism, unspecified: Secondary | ICD-10-CM

## 2019-09-16 DIAGNOSIS — E559 Vitamin D deficiency, unspecified: Secondary | ICD-10-CM

## 2019-09-16 DIAGNOSIS — M1711 Unilateral primary osteoarthritis, right knee: Secondary | ICD-10-CM

## 2019-09-16 DIAGNOSIS — I152 Hypertension secondary to endocrine disorders: Secondary | ICD-10-CM

## 2019-09-16 NOTE — Patient Instructions (Signed)
Osteoporosis  Osteoporosis is thinning and loss of density in your bones. Osteoporosis makes bones more brittle and fragile and more likely to break (fracture). Over time, osteoporosis can cause your bones to become so weak that they fracture after a minor fall. Bones in the hip, wrist, and spine are most likely to fracture due to osteoporosis. What are the causes? The exact cause of this condition is not known. What increases the risk? You may be at greater risk for osteoporosis if you:  Have a family history of the condition.  Have poor nutrition.  Use steroid medicines, such as prednisone.  Are female.  Are age 54 or older.  Smoke or have a history of smoking.  Are not physically active (are sedentary).  Are white (Caucasian) or of Asian descent.  Have a small body frame.  Take certain medicines, such as antiseizure medicines. What are the signs or symptoms? A fracture might be the first sign of osteoporosis, especially if the fracture results from a fall or injury that usually would not cause a bone to break. Other signs and symptoms include:  Pain in the neck or low back.  Stooped posture.  Loss of height. How is this diagnosed? This condition may be diagnosed based on:  Your medical history.  A physical exam.  A bone mineral density test, also called a DXA or DEXA test (dual-energy X-ray absorptiometry test). This test uses X-rays to measure the amount of minerals in your bones. How is this treated? The goal of treatment is to strengthen your bones and lower your risk for a fracture. Treatment may involve:  Making lifestyle changes, such as: ? Including foods with more calcium and vitamin D in your diet. ? Doing weight-bearing and muscle-strengthening exercises. ? Stopping tobacco use. ? Limiting alcohol intake.  Taking medicine to slow the process of bone loss or to increase bone density.  Taking daily supplements of calcium and vitamin D.  Taking  hormone replacement medicines, such as estrogen for women and testosterone for men.  Monitoring your levels of calcium and vitamin D. Follow these instructions at home:  Activity  Exercise as told by your health care provider. Ask your health care provider what exercises and activities are safe for you. You should do: ? Exercises that make you work against gravity (weight-bearing exercises), such as tai chi, yoga, or walking. ? Exercises to strengthen muscles, such as lifting weights. Lifestyle  Limit alcohol intake to no more than 1 drink a day for nonpregnant women and 2 drinks a day for men. One drink equals 12 oz of beer, 5 oz of wine, or 1 oz of hard liquor.  Do not use any products that contain nicotine or tobacco, such as cigarettes and e-cigarettes. If you need help quitting, ask your health care provider. Preventing falls  Use devices to help you move around (mobility aids) as needed, such as canes, walkers, scooters, or crutches.  Keep rooms well-lit and clutter-free.  Remove tripping hazards from walkways, including cords and throw rugs.  Install grab bars in bathrooms and safety rails on stairs.  Use rubber mats in the bathroom and other areas that are often wet or slippery.  Wear closed-toe shoes that fit well and support your feet. Wear shoes that have rubber soles or low heels.  Review your medicines with your health care provider. Some medicines can cause dizziness or changes in blood pressure, which can increase your risk of falling. General instructions  Include calcium and vitamin D in  your diet. Calcium is important for bone health, and vitamin D helps your body to absorb calcium. Good sources of calcium and vitamin D include: ? Certain fatty fish, such as salmon and tuna. ? Products that have calcium and vitamin D added to them (fortified products), such as fortified cereals. ? Egg yolks. ? Cheese. ? Liver.  Take over-the-counter and prescription medicines  only as told by your health care provider.  Keep all follow-up visits as told by your health care provider. This is important. Contact a health care provider if:  You have never been screened for osteoporosis and you are: ? A woman who is age 55 or older. ? A man who is age 20 or older. Get help right away if:  You fall or injure yourself. Summary  Osteoporosis is thinning and loss of density in your bones. This makes bones more brittle and fragile and more likely to break (fracture),even with minor falls.  The goal of treatment is to strengthen your bones and reduce your risk for a fracture.  Include calcium and vitamin D in your diet. Calcium is important for bone health, and vitamin D helps your body to absorb calcium.  Talk with your health care provider about screening for osteoporosis if you are a woman who is age 48 or older, or a man who is age 15 or older. This information is not intended to replace advice given to you by your health care provider. Make sure you discuss any questions you have with your health care provider. Document Revised: 05/30/2017 Document Reviewed: 04/11/2017 Elsevier Patient Education  2020 [employer].

## 2019-09-16 NOTE — Progress Notes (Signed)
Subjective:    Patient ID: Sheila Ray, female    DOB: 1940/12/30, 79 y.o.   MRN: [id number]  Chief Complaint  Patient presents with  . Medical Management of Chronic Issues   Pt presents to the office today for chronic follow up. Pt is followed by Cardiologistsannuallyfor A Fib. Pt is taking Xarelto daily and states this is doing well Hypertension This is a chronic problem. The current episode started more than 1 year ago. The problem has been resolved since onset. The problem is controlled. Pertinent negatives include no headaches, peripheral edema, PND or shortness of breath. Risk factors for coronary artery disease include dyslipidemia, obesity and sedentary lifestyle. The current treatment provides moderate improvement. There is no history of CAD/MI. Identifiable causes of hypertension include a thyroid problem.  Hyperlipidemia This is a chronic problem. The current episode started more than 1 year ago. The problem is uncontrolled. Recent lipid tests were reviewed and are high. Exacerbating diseases include obesity. Pertinent negatives include no shortness of breath. Current antihyperlipidemic treatment includes statins. The current treatment provides moderate improvement of lipids. Risk factors for coronary artery disease include dyslipidemia, hypertension and a sedentary lifestyle.  Thyroid Problem Presents for follow-up visit. Symptoms include fatigue. Patient reports no constipation or diarrhea. The symptoms have been stable. Her past medical history is significant for hyperlipidemia.  Arthritis Presents for follow-up visit. She complains of pain. Her pain is at a severity of 0/10. Associated symptoms include fatigue. Pertinent negatives include no diarrhea.  Osteoporosis  Pt taking Fosamax weekly. Last Dexascan 12/02/12.    Review of Systems  Constitutional: Positive for fatigue.  Respiratory: Negative for shortness of breath.   Cardiovascular: Negative for PND.    Gastrointestinal: Negative for constipation and diarrhea.  Musculoskeletal: Positive for arthritis.  Neurological: Negative for headaches.  All other systems reviewed and are negative.      Objective:   Physical Exam Vitals reviewed.  Constitutional:      General: She is not in acute distress.    Appearance: She is well-developed. She is obese.  HENT:     Head: Normocephalic and atraumatic.     Right Ear: Tympanic membrane normal.     Left Ear: Tympanic membrane normal.  Eyes:     Pupils: Pupils are equal, round, and reactive to light.  Neck:     Thyroid: No thyromegaly.  Cardiovascular:     Rate and Rhythm: Normal rate and regular rhythm.     Heart sounds: Normal heart sounds. No murmur.  Pulmonary:     Effort: Pulmonary effort is normal. No respiratory distress.     Breath sounds: Normal breath sounds. No wheezing.  Abdominal:     General: Bowel sounds are normal. There is no distension.     Palpations: Abdomen is soft.     Tenderness: There is no abdominal tenderness.  Musculoskeletal:        General: No tenderness. Normal range of motion.     Cervical back: Normal range of motion and neck supple.  Skin:    General: Skin is warm and dry.  Neurological:     Mental Status: She is alert and oriented to person, place, and time.     Cranial Nerves: No cranial nerve deficit.     Deep Tendon Reflexes: Reflexes are normal and symmetric.  Psychiatric:        Behavior: Behavior normal.        Thought Content: Thought content normal.  Judgment: Judgment normal.       BP 129/74   Pulse 88   Temp (!) 95 F (35 C) (Temporal)   Ht _0  (1.626 m)   Wt 182 lb 9.6 oz (82.8 kg)   SpO2 98%   BMI 31.34 kg/m      Assessment & Plan:  Sheila Ray comes in today with chief complaint of Medical Management of Chronic Issues   Diagnosis and orders addressed:  1. Atrial fibrillation, unspecified type ([place]) - CMP14+EGFR - CBC with Differential/Platelet  2.  Hypertension associated with diabetes ([place]) - CMP14+EGFR - CBC with Differential/Platelet  3. Type 2 diabetes mellitus with other specified complication, without long-term current use of insulin ([facility]) - CMP14+EGFR - CBC with Differential/Platelet  4. Hyperlipidemia associated with type 2 diabetes mellitus ([place]) - CMP14+EGFR - CBC with Differential/Platelet - Lipid panel  5. Hypothyroidism, unspecified type - CMP14+EGFR - CBC with Differential/Platelet - TSH  6. Osteoporosis, unspecified osteoporosis type, unspecified pathological fracture presence -Will repeat Dexa scan, has been on Fosamax for greater than 5 years. May try Prolia  - CMP14+EGFR - CBC with Differential/Platelet - DG [facility] DEXA  7. Primary osteoarthritis of right knee - CMP14+EGFR - CBC with Differential/Platelet  8. Obesity (BMI 30-39.9) - CMP14+EGFR - CBC with Differential/Platelet  9. Vitamin D deficiency - CMP14+EGFR - CBC with Differential/Platelet   Labs pending Health Maintenance reviewed Diet and exercise encouraged  Follow up plan: 6 months    Sheila Dun, FNP

## 2019-09-17 LAB — CMP14+EGFR
ALT: 11 IU/L (ref 0–32)
AST: 17 IU/L (ref 0–40)
Albumin/Globulin Ratio: 1.7 (ref 1.2–2.2)
Albumin: 4 g/dL (ref 3.7–4.7)
Alkaline Phosphatase: 68 IU/L (ref 39–117)
BUN/Creatinine Ratio: 13 (ref 12–28)
BUN: 11 mg/dL (ref 8–27)
Bilirubin Total: 0.2 mg/dL (ref 0.0–1.2)
CO2: 23 mmol/L (ref 20–29)
Calcium: 9.6 mg/dL (ref 8.7–10.3)
Chloride: 104 mmol/L (ref 96–106)
Creatinine, Ser: 0.83 mg/dL (ref 0.57–1.00)
GFR calc Af Amer: 78 mL/min/{1.73_m2} (ref >59)
GFR calc non Af Amer: 68 mL/min/{1.73_m2} (ref >59)
Globulin, Total: 2.3 g/dL (ref 1.5–4.5)
Glucose: 100 mg/dL — ABNORMAL HIGH (ref 65–99)
Potassium: 4.3 mmol/L (ref 3.5–5.2)
Sodium: 139 mmol/L (ref 134–144)
Total Protein: 6.3 g/dL (ref 6.0–8.5)

## 2019-09-17 LAB — LIPID PANEL
Chol/HDL Ratio: 5.3 ratio — ABNORMAL HIGH (ref 0.0–4.4)
Cholesterol, Total: 226 mg/dL — ABNORMAL HIGH (ref 100–199)
HDL: 43 mg/dL (ref >39)
LDL Chol Calc (NIH): 158 mg/dL — ABNORMAL HIGH (ref 0–99)
Triglycerides: 136 mg/dL (ref 0–149)
VLDL Cholesterol Cal: 25 mg/dL (ref 5–40)

## 2019-09-17 LAB — CBC WITH DIFFERENTIAL/PLATELET
Basophils Absolute: 0.1 10*3/uL (ref 0.0–0.2)
Basos: 1 %
EOS (ABSOLUTE): 0.6 10*3/uL — ABNORMAL HIGH (ref 0.0–0.4)
Eos: 7 %
Hematocrit: 41.4 % (ref 34.0–46.6)
Hemoglobin: 13.1 g/dL (ref 11.1–15.9)
Immature Grans (Abs): 0 10*3/uL (ref 0.0–0.1)
Immature Granulocytes: 0 %
Lymphocytes Absolute: 2 10*3/uL (ref 0.7–3.1)
Lymphs: 22 %
MCH: 25.1 pg — ABNORMAL LOW (ref 26.6–33.0)
MCHC: 31.6 g/dL (ref 31.5–35.7)
MCV: 80 fL (ref 79–97)
Monocytes Absolute: 0.7 10*3/uL (ref 0.1–0.9)
Monocytes: 7 %
Neutrophils Absolute: 5.8 10*3/uL (ref 1.4–7.0)
Neutrophils: 63 %
Platelets: 333 10*3/uL (ref 150–450)
RBC: 5.21 x10E6/uL (ref 3.77–5.28)
RDW: 13.9 % (ref 11.7–15.4)
WBC: 9.1 10*3/uL (ref 3.4–10.8)

## 2019-09-17 LAB — TSH: TSH: 2.62 u[IU]/mL (ref 0.450–4.500)

## 2019-10-13 ENCOUNTER — Other Ambulatory Visit: Payer: Self-pay

## 2019-10-13 ENCOUNTER — Ambulatory Visit (INDEPENDENT_AMBULATORY_CARE_PROVIDER_SITE_OTHER): Payer: Medicare Other

## 2019-10-13 DIAGNOSIS — M8588 Other specified disorders of bone density and structure, other site: Secondary | ICD-10-CM | POA: Diagnosis not present

## 2019-10-13 DIAGNOSIS — M810 Age-related osteoporosis without current pathological fracture: Secondary | ICD-10-CM

## 2019-10-13 DIAGNOSIS — Z780 Asymptomatic menopausal state: Secondary | ICD-10-CM | POA: Diagnosis not present

## 2019-12-16 ENCOUNTER — Other Ambulatory Visit: Payer: Self-pay

## 2019-12-16 DIAGNOSIS — I4891 Unspecified atrial fibrillation: Secondary | ICD-10-CM

## 2019-12-16 DIAGNOSIS — I152 Hypertension secondary to endocrine disorders: Secondary | ICD-10-CM

## 2019-12-16 DIAGNOSIS — E1159 Type 2 diabetes mellitus with other circulatory complications: Secondary | ICD-10-CM

## 2019-12-16 DIAGNOSIS — E119 Type 2 diabetes mellitus without complications: Secondary | ICD-10-CM

## 2019-12-16 MED ORDER — GLUCOSE BLOOD VI STRP: ORAL_STRIP | 6 refills | Status: DC

## 2019-12-16 MED ORDER — METFORMIN HCL 1000 MG PO TABS: 500.0000 mg | ORAL_TABLET | Freq: Every day | ORAL | 0 refills | Status: DC

## 2019-12-16 MED ORDER — METOPROLOL SUCCINATE ER 50 MG PO TB24: 50.0000 mg | ORAL_TABLET | Freq: Every day | ORAL | 0 refills | Status: DC

## 2019-12-28 ENCOUNTER — Encounter: Payer: Self-pay | Admitting: Family

## 2019-12-28 ENCOUNTER — Ambulatory Visit (INDEPENDENT_AMBULATORY_CARE_PROVIDER_SITE_OTHER): Payer: Medicare Other | Admitting: Family

## 2019-12-28 ENCOUNTER — Other Ambulatory Visit: Payer: Self-pay

## 2019-12-28 VITALS — BP 130/77 | HR 79 | Temp 97.1°F | Ht 64.0 in | Wt 179.6 lb

## 2019-12-28 DIAGNOSIS — I152 Hypertension secondary to endocrine disorders: Secondary | ICD-10-CM

## 2019-12-28 DIAGNOSIS — I10 Essential (primary) hypertension: Secondary | ICD-10-CM

## 2019-12-28 DIAGNOSIS — E1159 Type 2 diabetes mellitus with other circulatory complications: Secondary | ICD-10-CM | POA: Diagnosis not present

## 2019-12-28 DIAGNOSIS — R609 Edema, unspecified: Secondary | ICD-10-CM

## 2019-12-28 DIAGNOSIS — I4891 Unspecified atrial fibrillation: Secondary | ICD-10-CM

## 2019-12-28 DIAGNOSIS — R601 Generalized edema: Secondary | ICD-10-CM | POA: Diagnosis not present

## 2019-12-28 NOTE — Progress Notes (Signed)
   Subjective:    Patient ID: Sheila Ray, female    DOB: 1941/04/01, 79 y.o.   MRN: [id number]  Chief Complaint  Patient presents with  . Foot Swelling    HPI PT presents to the office today with bilateral leg swelling that started about two weeks ago. She called her cardiologists for A Fib and was told to decrease salt and to  keep elevated. She does report the swelling improves during the night when she keeps it elevated.  States she had SOB one day for a few mins., but that has resolved. Denies any palpitations.   She has HTN but this is controlled at this time.    Review of Systems  Constitutional: Negative for fatigue.  Respiratory: Negative for apnea, cough, choking, chest tightness and wheezing.   Cardiovascular: Positive for leg swelling. Negative for chest pain and palpitations.  All other systems reviewed and are negative.      Objective:   Physical Exam Vitals reviewed.  Constitutional:      General: She is not in acute distress.    Appearance: She is well-developed.  HENT:     Head: Normocephalic and atraumatic.  Eyes:     Pupils: Pupils are equal, round, and reactive to light.  Neck:     Thyroid: No thyromegaly.  Cardiovascular:     Rate and Rhythm: Normal rate and regular rhythm.     Heart sounds: Normal heart sounds. No murmur heard.   Pulmonary:     Effort: Pulmonary effort is normal. No respiratory distress.     Breath sounds: Normal breath sounds. No wheezing.  Abdominal:     General: Bowel sounds are normal. There is no distension.     Palpations: Abdomen is soft.     Tenderness: There is no abdominal tenderness.  Musculoskeletal:        General: No tenderness. Normal range of motion.     Cervical back: Normal range of motion and neck supple.     Right lower leg: Edema (trace) present.     Left lower leg: Edema (trace) present.  Skin:    General: Skin is warm and dry.  Neurological:     Mental Status: She is alert and oriented to person,  place, and time.     Cranial Nerves: No cranial nerve deficit.     Deep Tendon Reflexes: Reflexes are normal and symmetric.  Psychiatric:        Behavior: Behavior normal.        Thought Content: Thought content normal.        Judgment: Judgment normal.        BP 130/77   Pulse 79   Temp (!) 97.1 F (36.2 C) (Temporal)   Ht _0  (1.626 m)   Wt 179 lb 9.6 oz (81.5 kg)   BMI 30.83 kg/m   Assessment & Plan:  [place] Sheila Ray comes in today with chief complaint of Foot Swelling   Diagnosis and orders addressed:  1. Peripheral edema Keep feet elevated Low salt diet Start wearing compression hose Keep Cardiologists follow up - Compression stockings - BMP8+EGFR - Brain natriuretic peptide  2. Atrial fibrillation, unspecified type ([place])  3. Hypertension associated with diabetes ([place])   Labs pending Health Maintenance reviewed Diet and exercise encouraged   [name], FNP

## 2019-12-28 NOTE — Patient Instructions (Signed)
Peripheral Edema  Peripheral edema is swelling that is caused by a buildup of fluid. Peripheral edema most often affects the lower legs, ankles, and feet. It can also develop in the arms, hands, and face. The area of the body that has peripheral edema will look swollen. It may also feel heavy or warm. Your clothes may start to feel tight. Pressing on the area may make a temporary dent in your skin. You may not be able to move your swollen arm or leg as much as usual. There are many causes of peripheral edema. It can happen because of a complication of other conditions such as congestive heart failure, kidney disease, or a problem with your blood circulation. It also can be a side effect of certain medicines or because of an infection. It often happens to women during pregnancy. Sometimes, the cause is not known. Follow these instructions at home: Managing pain, stiffness, and swelling   Raise (elevate) your legs while you are sitting or lying down.  Move around often to prevent stiffness and to lessen swelling.  Do not sit or stand for long periods of time.  Wear support stockings as told by your health care provider. Medicines  Take over-the-counter and prescription medicines only as told by your health care provider.  Your health care provider may prescribe medicine to help your body get rid of excess water (diuretic). General instructions  Pay attention to any changes in your symptoms.  Follow instructions from your health care provider about limiting salt (sodium) in your diet. Sometimes, eating less salt may reduce swelling.  Moisturize skin daily to help prevent skin from cracking and draining.  Keep all follow-up visits as told by your health care provider. This is important. Contact a health care provider if you have:  A fever.  Edema that starts suddenly or is getting worse, especially if you are pregnant or have a medical condition.  Swelling in only one leg.  Increased  swelling, redness, or pain in one or both of your legs.  Drainage or sores at the area where you have edema. Get help right away if you:  Develop shortness of breath, especially when you are lying down.  Have pain in your chest or abdomen.  Feel weak.  Feel faint. Summary  Peripheral edema is swelling that is caused by a buildup of fluid. Peripheral edema most often affects the lower legs, ankles, and feet.  Move around often to prevent stiffness and to lessen swelling. Do not sit or stand for long periods of time.  Pay attention to any changes in your symptoms.  Contact a health care provider if you have edema that starts suddenly or is getting worse, especially if you are pregnant or have a medical condition.  Get help right away if you develop shortness of breath, especially when lying down. This information is not intended to replace advice given to you by your health care provider. Make sure you discuss any questions you have with your health care provider. Document Revised: 03/11/2018 Document Reviewed: 03/11/2018 Elsevier Patient Education  2020 [employer] Inc.  

## 2019-12-29 LAB — BMP8+EGFR
BUN/Creatinine Ratio: 11 — ABNORMAL LOW (ref 12–28)
BUN: 12 mg/dL (ref 8–27)
CO2: 21 mmol/L (ref 20–29)
Calcium: 9.2 mg/dL (ref 8.7–10.3)
Chloride: 106 mmol/L (ref 96–106)
Creatinine, Ser: 1.09 mg/dL — ABNORMAL HIGH (ref 0.57–1.00)
GFR calc Af Amer: 56 mL/min/{1.73_m2} — ABNORMAL LOW (ref >59)
GFR calc non Af Amer: 48 mL/min/{1.73_m2} — ABNORMAL LOW (ref >59)
Glucose: 109 mg/dL — ABNORMAL HIGH (ref 65–99)
Potassium: 4.1 mmol/L (ref 3.5–5.2)
Sodium: 141 mmol/L (ref 134–144)

## 2019-12-29 LAB — BRAIN NATRIURETIC PEPTIDE: BNP: 207.3 pg/mL — ABNORMAL HIGH (ref 0.0–100.0)

## 2020-01-04 ENCOUNTER — Telehealth: Payer: Self-pay | Admitting: Family

## 2020-01-04 NOTE — Telephone Encounter (Signed)
Creatinine is not elevated enough to see specialists, avoid NSAID"s

## 2020-01-04 NOTE — Telephone Encounter (Signed)
  REFERRAL REQUEST Telephone Note 01/04/2020  What type of referral do you need? Urology  Why do you need this referral? Lab work should elevated kidney levels per pt   Have you been seen at our office for this problem? no (Advise that they may need an appointment with their PCP before a referral can be done)  Is there a particular doctor or location that you prefer? No preference  Patient notified that referrals can take up to a week or longer to process. If they haven't heard anything within a week they should call back and speak with the referral department.

## 2020-01-04 NOTE — Telephone Encounter (Signed)
Patient was seen and labs drawn 12/28/2019 - please advise

## 2020-01-05 NOTE — Telephone Encounter (Signed)
Patient aware and verbalized understanding. °

## 2020-02-07 ENCOUNTER — Ambulatory Visit (INDEPENDENT_AMBULATORY_CARE_PROVIDER_SITE_OTHER): Payer: Medicare Other | Admitting: Family Medicine

## 2020-02-07 ENCOUNTER — Emergency Department (HOSPITAL_COMMUNITY): Payer: Medicare Other

## 2020-02-07 ENCOUNTER — Other Ambulatory Visit: Payer: Self-pay

## 2020-02-07 ENCOUNTER — Encounter (HOSPITAL_COMMUNITY): Payer: Self-pay

## 2020-02-07 ENCOUNTER — Encounter: Payer: Self-pay | Admitting: Family Medicine

## 2020-02-07 ENCOUNTER — Emergency Department (HOSPITAL_COMMUNITY)
Admission: EM | Admit: 2020-02-07 | Discharge: 2020-02-07 | Disposition: A | Discharge status: Home / Self Care | Payer: Medicare Other | Attending: Emergency Medicine | Admitting: Emergency Medicine

## 2020-02-07 VITALS — BP 137/84 | HR 166 | Ht 64.0 in | Wt 177.0 lb

## 2020-02-07 DIAGNOSIS — E119 Type 2 diabetes mellitus without complications: Secondary | ICD-10-CM | POA: Insufficient documentation

## 2020-02-07 DIAGNOSIS — Z743 Need for continuous supervision: Secondary | ICD-10-CM | POA: Diagnosis not present

## 2020-02-07 DIAGNOSIS — I499 Cardiac arrhythmia, unspecified: Secondary | ICD-10-CM | POA: Diagnosis not present

## 2020-02-07 DIAGNOSIS — J90 Pleural effusion, not elsewhere classified: Secondary | ICD-10-CM | POA: Diagnosis not present

## 2020-02-07 DIAGNOSIS — I4891 Unspecified atrial fibrillation: Secondary | ICD-10-CM

## 2020-02-07 DIAGNOSIS — R002 Palpitations: Secondary | ICD-10-CM

## 2020-02-07 DIAGNOSIS — I10 Essential (primary) hypertension: Secondary | ICD-10-CM | POA: Insufficient documentation

## 2020-02-07 DIAGNOSIS — E039 Hypothyroidism, unspecified: Secondary | ICD-10-CM | POA: Diagnosis not present

## 2020-02-07 DIAGNOSIS — R52 Pain, unspecified: Secondary | ICD-10-CM | POA: Diagnosis not present

## 2020-02-07 DIAGNOSIS — R000 Tachycardia, unspecified: Secondary | ICD-10-CM | POA: Diagnosis not present

## 2020-02-07 DIAGNOSIS — Z96651 Presence of right artificial knee joint: Secondary | ICD-10-CM | POA: Diagnosis not present

## 2020-02-07 DIAGNOSIS — I517 Cardiomegaly: Secondary | ICD-10-CM | POA: Diagnosis not present

## 2020-02-07 DIAGNOSIS — R079 Chest pain, unspecified: Secondary | ICD-10-CM

## 2020-02-07 LAB — PROTIME-INR
INR: 3.3 — ABNORMAL HIGH (ref 0.8–1.2)
Prothrombin Time: 32.1 seconds — ABNORMAL HIGH (ref 11.4–15.2)

## 2020-02-07 LAB — CBC
HCT: 42.2 % (ref 36.0–46.0)
Hemoglobin: 12.6 g/dL (ref 12.0–15.0)
MCH: 23.4 pg — ABNORMAL LOW (ref 26.0–34.0)
MCHC: 29.9 g/dL — ABNORMAL LOW (ref 30.0–36.0)
MCV: 78.4 fL — ABNORMAL LOW (ref 80.0–100.0)
Platelets: 306 10*3/uL (ref 150–400)
RBC: 5.38 MIL/uL — ABNORMAL HIGH (ref 3.87–5.11)
RDW: 15.1 % (ref 11.5–15.5)
WBC: 8.1 10*3/uL (ref 4.0–10.5)
nRBC: 0 % (ref 0.0–0.2)

## 2020-02-07 LAB — BASIC METABOLIC PANEL
Anion gap: 9 (ref 5–15)
BUN: 14 mg/dL (ref 8–23)
CO2: 21 mmol/L — ABNORMAL LOW (ref 22–32)
Calcium: 9.2 mg/dL (ref 8.9–10.3)
Chloride: 108 mmol/L (ref 98–111)
Creatinine, Ser: 0.7 mg/dL (ref 0.44–1.00)
GFR calc Af Amer: >60 mL/min (ref >60)
GFR calc non Af Amer: >60 mL/min (ref >60)
Glucose, Bld: 127 mg/dL — ABNORMAL HIGH (ref 70–99)
Potassium: 4.1 mmol/L (ref 3.5–5.1)
Sodium: 138 mmol/L (ref 135–145)

## 2020-02-07 LAB — HEPATIC FUNCTION PANEL
ALT: 18 U/L (ref 0–44)
AST: 20 U/L (ref 15–41)
Albumin: 3.5 g/dL (ref 3.5–5.0)
Alkaline Phosphatase: 58 U/L (ref 38–126)
Bilirubin, Direct: <0.1 mg/dL (ref 0.0–0.2)
Total Bilirubin: 0.3 mg/dL (ref 0.3–1.2)
Total Protein: 6.5 g/dL (ref 6.5–8.1)

## 2020-02-07 LAB — TROPONIN I (HIGH SENSITIVITY)
Troponin I (High Sensitivity): 17 ng/L (ref <18)
Troponin I (High Sensitivity): 19 ng/L — ABNORMAL HIGH (ref <18)

## 2020-02-07 IMAGING — DX DG CHEST 1V PORT
1 series · 1 of 1 positions shown · non-contrast
Comparison: [DATE]

CLINICAL DATA: Tachycardia.

EXAM:
PORTABLE CHEST 1 VIEW

[chest ap]
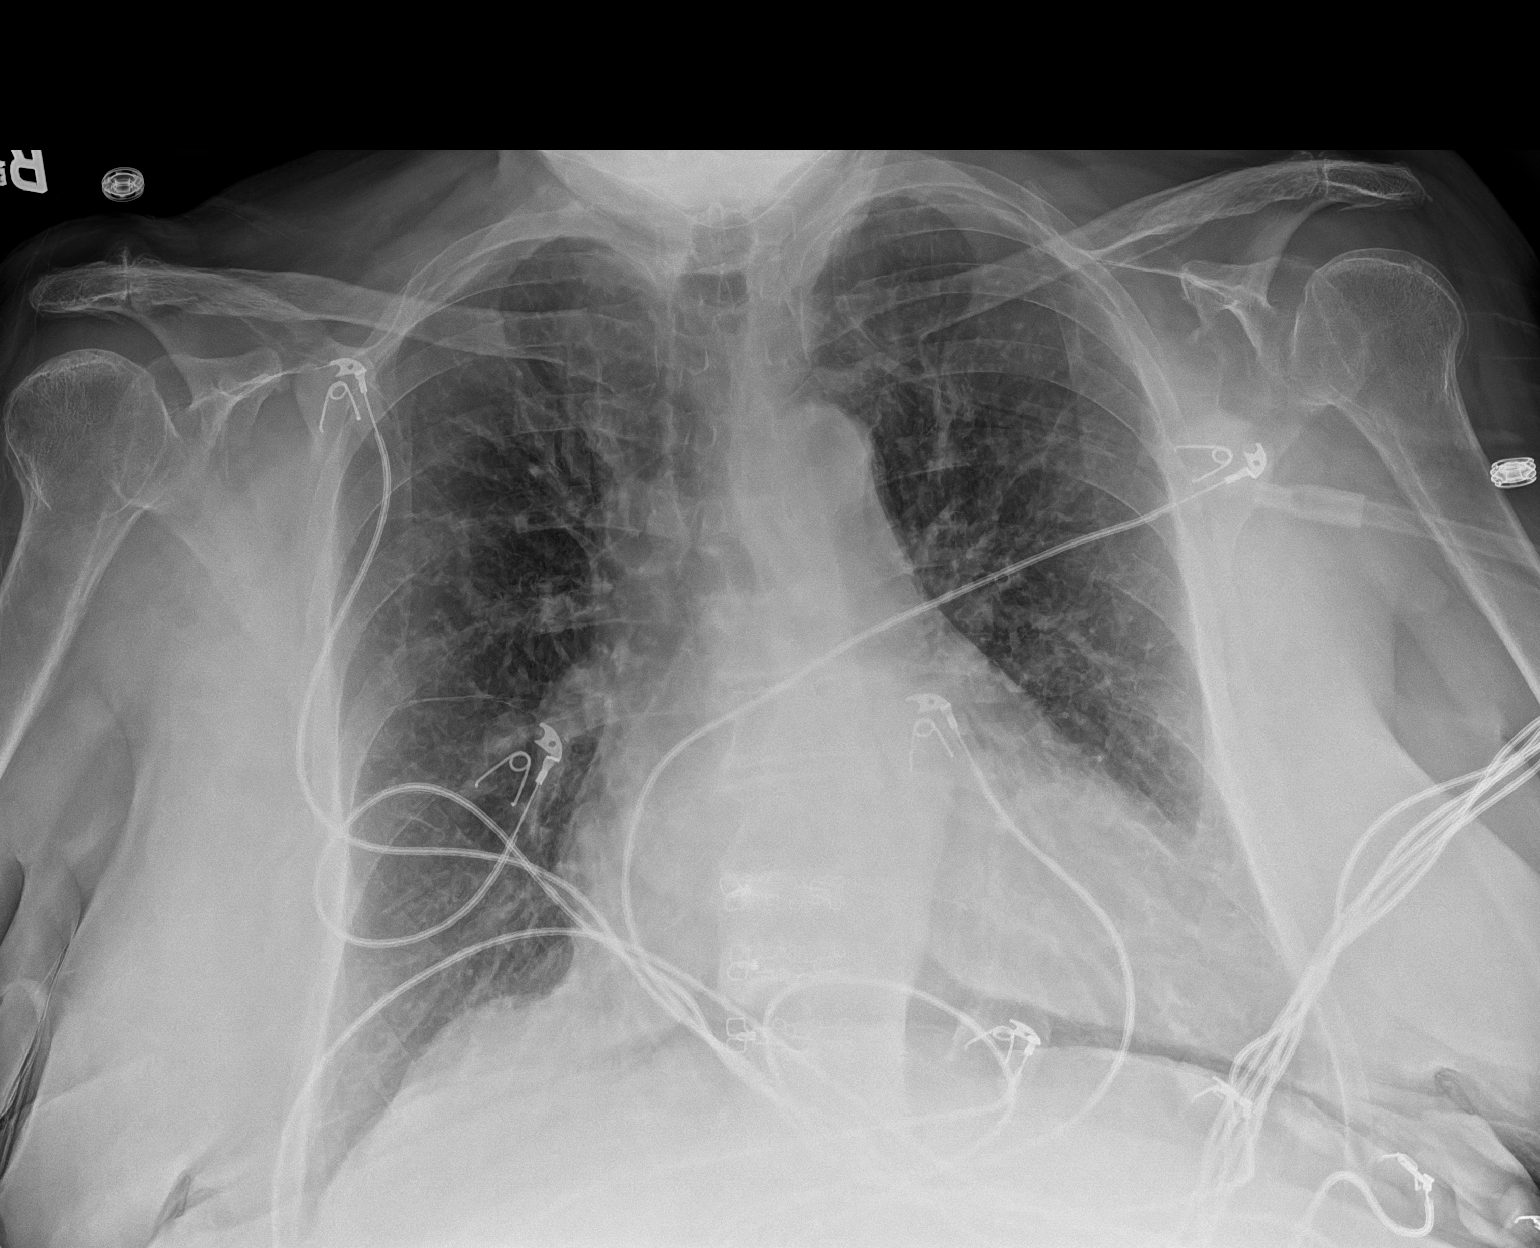

[1 of 1 positions shown; findings below may reference images not displayed]

FINDINGS: Lungs are adequately inflated without focal airspace consolidation
or effusion. Mild cardiomegaly is present. Degenerative change of
the spine and shoulders.
IMPRESSION: 1.  No acute cardiopulmonary disease.

2.  Mild cardiomegaly.

## 2020-02-07 MED ORDER — SODIUM CHLORIDE 0.9 % IV BOLUS
500.0000 mL | Freq: Once | INTRAVENOUS | Status: AC
  Administered 2020-02-07: 500 mL via INTRAVENOUS

## 2020-02-07 MED ORDER — DILTIAZEM HCL 50 MG/10ML IV SOLN
10.0000 mg | Freq: Once | INTRAVENOUS | Status: AC
  Administered 2020-02-07: 10 mg via INTRAVENOUS
  Filled 2020-02-07: qty 2

## 2020-02-07 NOTE — ED Provider Notes (Signed)
Emergency Department Provider Note   I have reviewed the triage vital signs and the nursing notes.   HISTORY  Chief Complaint Tachycardia   HPI Sheila Ray is a 79 y.o. female with past medical history reviewed below including atrial fibrillation on Xarelto presents to the emergency department with acute onset palpitations with chest tightness and shortness of breath.  Symptoms began last night.  She suddenly felt like she had trouble breathing with tightness in her chest and pounding sensation there.  She is compliant with her anticoagulation as well as her rate control medications.  She is followed by a cardiology group out of [facility] in [place].  She states that cardioversion was attempted on her in the past but was not successful and so rate control has been the primary treatment for her.  She went to her primary care doctor's office this morning when symptoms continued and was given IV diltiazem.  Since then, her symptoms have resolved. No radiation of symptoms or modifying factors.   Past Medical History:  Diagnosis Date  . Arthritis   . Atrial fibrillation ([place])   . Complication of anesthesia   . Diabetes mellitus without complication ([place])   . Diverticulosis   . Fatigue   . Gastric polyp   . Goiter   . Hyperlipidemia   . Hypertension   . Hypothyroid    taken off of thyroid medication 2012014   . Obese   . Osteopenia   . PONV (postoperative nausea and vomiting)   . Postmenopausal   . Rosacea   . 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 Vitamin D deficiency     Patient Active Problem List   Diagnosis Date Noted  . Atrial fibrillation ([place]) 08/19/2016  . OA (osteoarthritis) of knee 07/25/2014  . Hypothyroid 03/04/2013  . Stroke ([place]) 01/15/2013  . Osteoporosis 12/02/2012  . DM (diabetes mellitus) ([place]) 10/22/2012  . Hypertension associated with diabetes ([place]) 10/22/2012  . Hyperlipidemia associated with type 2 diabetes  mellitus ([place]) 10/22/2012  . Vitamin D deficiency 09/13/2012  . Obesity (BMI 30-39.9) 09/13/2012    Past Surgical History:  Procedure Laterality Date  . ABDOMINAL HYSTERECTOMY  1985  . FOOT SURGERY Right 08/2002  . right knee arthroscopy   2013   . TONSILLECTOMY    . 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 Osteoporosis Mother   . Alzheimer's disease Mother 85  . Arthritis Mother   . Cancer Father        Lung  . Arthritis Sister   . Obesity Sister   . Heart attack Brother 87  . Heart disease Son   . Arthritis Brother   . Arthritis Sister   . Cancer Sister        breast cancer    Social History Social History   Tobacco Use  . Smoking status: Never Smoker  . Smokeless tobacco: Never Used  Vaping Use  . Vaping Use: Never used  Substance Use Topics  . Alcohol use: No  . Drug use: No    Review of Systems  Constitutional: No fever/chills Eyes: No visual changes. ENT: No sore throat. Cardiovascular: Positive chest pain and palpitations.  Respiratory: Positive shortness of breath.  Gastrointestinal: No abdominal pain.  No nausea, no vomiting.  No diarrhea.  No constipation. Genitourinary: Negative for dysuria. Musculoskeletal: Negative for back pain. Skin: Negative for rash. Neurological: Negative for headaches, focal weakness or numbness.  10-point ROS otherwise negative.  ____________________________________________   PHYSICAL EXAM:  VITAL SIGNS: ED Triage Vitals  Enc Vitals Group     BP 02/07/20 1056 (!) 160/76     Pulse Rate 02/07/20 1056 (!) 106     Resp 02/07/20 1056 19     Temp 02/07/20 1056 98.1 F (36.7 C)     Temp Source 02/07/20 1056 Oral     SpO2 02/07/20 1056  100 %     Weight 02/07/20 1057 175 lb (79.4 kg)     Height 02/07/20 1057 5' 4.5" (1.638 m)   Constitutional: Alert and oriented. Well appearing and in no acute distress. Eyes: Conjunctivae are normal. Head: Atraumatic. Nose: No congestion/rhinnorhea. Mouth/Throat: Mucous membranes are moist.  Neck: No stridor.   Cardiovascular: Irregularly irregular. Good peripheral circulation. Grossly normal heart sounds.   Respiratory: Normal respiratory effort.  No retractions. Lungs CTAB. Gastrointestinal: Soft and nontender. No distention.  Musculoskeletal: No lower extremity tenderness nor edema. No gross deformities of extremities. Neurologic:  Normal speech and language. No gross focal neurologic deficits are appreciated.  Skin:  Skin is warm, dry and intact. No rash noted.  ____________________________________________   LABS (all labs ordered are listed, but only abnormal results are displayed)  Labs Reviewed  BASIC METABOLIC PANEL - Abnormal; Notable for the following components:      Result Value   CO2 21 (*)    Glucose, Bld 127 (*)    All other components within normal limits  CBC - Abnormal; Notable for the following components:   RBC 5.38 (*)    MCV 78.4 (*)    MCH 23.4 (*)    MCHC 29.9 (*)    All other components within normal limits  PROTIME-INR - Abnormal; Notable for the following components:   Prothrombin Time 32.1 (*)    INR 3.3 (*)    All other components within normal limits  TROPONIN I (HIGH SENSITIVITY) - Abnormal; Notable for the following components:   Troponin I (High Sensitivity) 19 (*)    All other components within normal limits  HEPATIC FUNCTION PANEL  TROPONIN I (HIGH SENSITIVITY)   ____________________________________________  EKG   EKG Interpretation  Date/Time:  Monday February 07 2020 11:12:34 EDT Ventricular Rate:  121 PR Interval:    QRS Duration: 71 QT Interval:  316 QTC Calculation: 449 R Axis:   -2 Text Interpretation: Atrial  fibrillation Anterior infarct, old Borderline repolarization abnormality No STEMI Confirmed by Nanda Quinton [phone]) on 02/07/2020 12:12:55 PM       ____________________________________________  RADIOLOGY  DG Chest Port 1 View  Result Date: 02/07/2020 CLINICAL DATA:  Tachycardia. EXAM: PORTABLE CHEST 1 VIEW COMPARISON:  06/16/2014 FINDINGS: Lungs are adequately inflated without focal airspace consolidation or effusion. Mild cardiomegaly is present. Degenerative change of the spine and shoulders. IMPRESSION: 1.  No acute cardiopulmonary disease. 2.  Mild cardiomegaly. Electronically Signed   By: Marin Olp M.D.   On: 02/07/2020 11:37    ____________________________________________   PROCEDURES  Procedure(s) performed:   Procedures  CRITICAL CARE Performed by: Margette Fast Total critical care time: 35 minutes Critical care time was exclusive of separately billable procedures and treating other patients. Critical care was necessary to treat or prevent imminent or life-threatening deterioration. Critical care was time spent  personally by me on the following activities: development of treatment plan with patient and/or surrogate as well as nursing, discussions with consultants, evaluation of patient's response to treatment, examination of patient, obtaining history from patient or surrogate, ordering and performing treatments and interventions, ordering and review of laboratory studies, ordering and review of radiographic studies, pulse oximetry and re-evaluation of patient's condition.  Nanda Quinton, MD Emergency Medicine  ____________________________________________   INITIAL IMPRESSION / ASSESSMENT AND PLAN / ED COURSE  Pertinent labs & imaging results that were available during my care of the patient were reviewed by me and considered in my medical decision making (see chart for details).   Patient arrives to the emergency department with palpitations with chest pain starting  last night.  She was apparently in A. fib with RVR at her PCPs office and was given IV diltiazem with improvement in rate.  Her symptoms have resolved.  Her EKG in the emergency department shows rate in the 120s but at bedside she is near 100 most of the time.  She continues to be in atrial fibrillation.  It sounds if she has had tempted cardioversion in the past which was not successful.  I plan for rate control primarily in this patient along with screening troponins x2 along with IV fluids and chest x-ray.  If able to adequately rate control can discharge with cardiology follow-up.   Labs and imaging reviewed. Rate well controlled in the ED. Patient to continue home meds and call PCP and Cardiology office for follow up appointments. Discussed ED return precautions.  ____________________________________________  FINAL CLINICAL IMPRESSION(S) / ED DIAGNOSES  Final diagnoses:  Atrial fibrillation with RVR ([place])     MEDICATIONS GIVEN DURING THIS VISIT:  Medications  diltiazem (CARDIZEM) injection SOLN 10 mg (10 mg Intravenous Given 02/07/20 1217)  sodium chloride 0.9 % bolus 500 mL (0 mLs Intravenous Stopped 02/07/20 1507)    Note:  This document was prepared using Dragon voice recognition software and may include unintentional dictation errors.  Nanda Quinton, MD, [facility] Emergency Medicine    Esperansa Sarabia, [facility], MD 02/09/20 [phone]

## 2020-02-07 NOTE — ED Triage Notes (Signed)
Pt to er via [facility], states that she was watching tv last night and pt had a sudden onset of palpitations, states that she tried to sit down and felt like her heart was pounding, states that she tried to lay down, but then she felt like she couldn't breath.  Pt went to the md this am and was told that she was in a fib, pt was given 20 of Cardizem.  Pt appears to be in a fib on the monitor with a rate around 110.  Pt states that she is feeling better after getting medicine this am.  Pt has indwelling 18 in L ac

## 2020-02-07 NOTE — Discharge Instructions (Signed)
You were seen in the emergency room today with atrial fibrillation with elevated heart rate.  Your heart rate has improved significantly with medicines given by your primary care doctor as well as here in the emergency department.  Your lab work is reassuring.  Please call your cardiology office this afternoon to schedule a follow-up appointment in the coming week to discuss your ED visit and rate control medicines at home.  If you develop new or worsening symptoms please return to the emergency department immediately and/or call 911.

## 2020-02-07 NOTE — Progress Notes (Signed)
Subjective: CC: Heart palpitations PCP: Sharion Balloon, FNP Sheila Ray is a 79 y.o. female presenting to clinic today for:  1.  Heart palpitations Patient reports onset last evening.  She notes that the heart palpitations felt like a "train running through her chest".  She denies chest pain specifically but does note that the heartbeat sometimes is forceful.  She does not feel that she has lost some breathing ability and in fact cannot lay flat last night to go to bed.  She thought about seeking emergent care but was worried about financial implications.  Her cardiologist is Dr. Tommi Rumps in [place].  She is on anticoagulation has been compliant with this medication.  She is also on diltiazem and metoprolol.  Both the medication she has been compliant with and has taken both this morning.  She denies any bleeding, fevers, diarrhea or other potential instigating factors.  She has been well feeling and in her usual state of health until last evening.   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, small vessel   Vitamin D deficiency     Current Outpatient Medications:    alendronate (FOSAMAX) 70 MG tablet, TAKE 1 TABLET BY MOUTH  WEEKLY AS DIRECTED, Disp: 12 tablet, Rfl: 3   Cyanocobalamin (VITAMIN B12) 1000 MCG TBCR, Take by mouth., Disp: , Rfl:      diltiazem (CARDIZEM CD) 240 MG 24 hr capsule, TAKE 1 CAPSULE BY MOUTH  DAILY, Disp: 90 capsule, Rfl: 3   glucose blood (ONE TOUCH ULTRA TEST) test strip, USE TO CHECK BLOOD GLUCOSE  ONCE A DAY AS INSTRUCTED, Disp: 100 each, Rfl: 6   lisinopril (ZESTRIL) 40 MG tablet, TAKE 1 TABLET BY MOUTH  DAILY, Disp: 90 tablet, Rfl: 3   metFORMIN (GLUCOPHAGE) 1000 MG tablet, Take 0.5 tablets (500 mg total) by mouth daily with breakfast., Disp: 90 tablet, Rfl: 0   metoprolol succinate (TOPROL-XL) 50 MG 24 hr tablet, Take 1 tablet (50 mg total) by mouth daily. Take with or immediately following a meal., Disp: 90 tablet, Rfl: 0   ONETOUCH DELICA LANCETS 99M MISC, 1 each by Does not apply route daily. Use to check BG daily, Disp: 100 each, Rfl: 2   XARELTO 20 MG TABS tablet, TAKE 1 TABLET BY MOUTH  DAILY, Disp: 90 tablet, Rfl: 0 Social History   Socioeconomic History   Marital status: Divorced    Spouse name: Not on file   Number of children: 1   Years of education: 12   Highest education level: 12th grade  Occupational History   Occupation: retired  Tobacco Use   Smoking status: Never Smoker   Smokeless tobacco: Never Used  Media planner  Vaping Use: Never used  Substance and Sexual Activity   Alcohol use: No   Drug use: No   Sexual activity: Not Currently  Other Topics Concern   Not on file  Social History Narrative   Not on file   Social Determinants of Health   Financial Resource Strain:    Difficulty of Paying Living Expenses:   Food Insecurity:    Worried About Charity fundraiser in the Last Year:    Arboriculturist in the Last Year:   Transportation Needs:    Film/video editor (Medical):    Lack of Transportation (Non-Medical):   Physical Activity: Unknown   Days of Exercise per Week: 1 day   Minutes of Exercise per Session: Not on file  Stress:    Feeling of Stress :   Social Connections:    Frequency of Communication with Friends and Family:     Frequency of Social Gatherings with Friends and Family:    Attends Religious Services:    Active Member of Clubs or Organizations:    Attends Music therapist:    Marital Status:   Intimate Partner Violence:    Fear of Current or Ex-Partner:    Emotionally Abused:    Physically Abused:    Sexually Abused:    Family History  Problem Relation Age of Onset   Osteoporosis Mother    Alzheimer's disease Mother 102   Arthritis Mother    Cancer Father        Lung   Arthritis Sister    Obesity Sister    Heart attack Brother 49   Heart disease Son    Arthritis Brother    Arthritis Sister    Cancer Sister        breast cancer    Objective: Office vital signs reviewed. BP 137/84    Pulse (!) 166    Ht 5\' 4"  (1.626 m)    Wt 177 lb (80.3 kg)    SpO2 91%    BMI 30.38 kg/m   Physical Examination:  General: Awake, alert, anxious appearing HEENT: Normal, no JVD Cardio:tachycardic. S1S2 heard, no murmurs appreciated Pulm: clear to auscultation bilaterally, no wheezes, rhonchi or rales; normal work of breathing on room air GI: soft, obese. Extremities: warm, well perfused, No edema, cyanosis or clubbing; +2 pulses bilaterally Neuro: AO  Assessment/ Plan: 79 y.o. female   1. Atrial fibrillation with RVR ([place]) Patient was placed on supplemental oxygen here in the office for comfort.  Heart rate is into the 170s on exam and EKG.  Given shortness of breath and what sounds like orthopnea, despite absence of evidence of fluid overload, I think that she should be evaluated emergently at [facility]  She is currently on both a calcium channel blocker and a beta-blocker.  She is anticoagulated.  Anticipate she will need cardioversion.  I asked that she be transported to [facility] for this.  However, EMS will bring her to [facility] instead.  2. Palpitations - EKG 12-Lead   Orders Placed This Encounter  Procedures   EKG 12-Lead   No orders of the  defined types were placed in this encounter.    [name], DO [place] [phone]

## 2020-02-09 DIAGNOSIS — I4819 Other persistent atrial fibrillation: Secondary | ICD-10-CM | POA: Diagnosis not present

## 2020-02-09 DIAGNOSIS — I4891 Unspecified atrial fibrillation: Secondary | ICD-10-CM | POA: Diagnosis not present

## 2020-02-09 DIAGNOSIS — I10 Essential (primary) hypertension: Secondary | ICD-10-CM | POA: Diagnosis not present

## 2020-02-09 DIAGNOSIS — I252 Old myocardial infarction: Secondary | ICD-10-CM | POA: Diagnosis not present

## 2020-02-16 ENCOUNTER — Other Ambulatory Visit: Payer: Self-pay | Admitting: Family

## 2020-02-16 DIAGNOSIS — I152 Hypertension secondary to endocrine disorders: Secondary | ICD-10-CM

## 2020-02-16 DIAGNOSIS — I4891 Unspecified atrial fibrillation: Secondary | ICD-10-CM

## 2020-02-16 DIAGNOSIS — E1159 Type 2 diabetes mellitus with other circulatory complications: Secondary | ICD-10-CM

## 2020-02-28 DIAGNOSIS — I4819 Other persistent atrial fibrillation: Secondary | ICD-10-CM | POA: Diagnosis not present

## 2020-03-10 DIAGNOSIS — I4819 Other persistent atrial fibrillation: Secondary | ICD-10-CM | POA: Diagnosis not present

## 2020-03-15 LAB — HM DIABETES EYE EXAM

## 2020-03-21 ENCOUNTER — Other Ambulatory Visit: Payer: Self-pay

## 2020-03-21 ENCOUNTER — Ambulatory Visit (INDEPENDENT_AMBULATORY_CARE_PROVIDER_SITE_OTHER): Payer: Medicare Other | Admitting: Family

## 2020-03-21 ENCOUNTER — Encounter: Payer: Self-pay | Admitting: Family

## 2020-03-21 DIAGNOSIS — E559 Vitamin D deficiency, unspecified: Secondary | ICD-10-CM

## 2020-03-21 DIAGNOSIS — M1711 Unilateral primary osteoarthritis, right knee: Secondary | ICD-10-CM | POA: Diagnosis not present

## 2020-03-21 DIAGNOSIS — E1169 Type 2 diabetes mellitus with other specified complication: Secondary | ICD-10-CM

## 2020-03-21 DIAGNOSIS — Z1152 Encounter for screening for COVID-19: Secondary | ICD-10-CM

## 2020-03-21 DIAGNOSIS — I4891 Unspecified atrial fibrillation: Secondary | ICD-10-CM | POA: Diagnosis not present

## 2020-03-21 DIAGNOSIS — I152 Hypertension secondary to endocrine disorders: Secondary | ICD-10-CM

## 2020-03-21 DIAGNOSIS — E039 Hypothyroidism, unspecified: Secondary | ICD-10-CM

## 2020-03-21 DIAGNOSIS — I10 Essential (primary) hypertension: Secondary | ICD-10-CM

## 2020-03-21 DIAGNOSIS — E669 Obesity, unspecified: Secondary | ICD-10-CM

## 2020-03-21 DIAGNOSIS — E1159 Type 2 diabetes mellitus with other circulatory complications: Secondary | ICD-10-CM | POA: Diagnosis not present

## 2020-03-21 DIAGNOSIS — E785 Hyperlipidemia, unspecified: Secondary | ICD-10-CM

## 2020-03-21 NOTE — Progress Notes (Signed)
Virtual Visit via telephone Note Due to COVID-19 pandemic this visit was conducted virtually. This visit type was conducted due to national recommendations for restrictions regarding the COVID-19 Pandemic (e.g. social distancing, sheltering in place) in an effort to limit this patient's exposure and mitigate transmission in our community. All issues noted in this document were discussed and addressed.  A physical exam was not performed with this format.  I connected with Sheila Ray on 03/21/20 at 12:25 pm by telephone and verified that I am speaking with the correct person using two identifiers. Sheila Ray is currently located at home and no one is currently with her during visit. The provider, [name], FNP is located in their office at time of visit.  I discussed the limitations, risks, security and privacy concerns of performing an evaluation and management service by telephone and the availability of in person appointments. I also discussed with the patient that there may be a patient responsible charge related to this service. The patient expressed understanding and agreed to proceed.   History and Present Illness:   Pt presents to the office today for chronic follow up. Pt is followed by Cardiologistsannuallyfor A Fib. Ptis taking Xarelto daily andstates this is doing well Hypertension This is a chronic problem. The current episode started more than 1 year ago. The problem has been resolved since onset. The problem is controlled. Associated symptoms include peripheral edema. Pertinent negatives include no blurred vision, malaise/fatigue or shortness of breath. The current treatment provides moderate improvement. Identifiable causes of hypertension include a thyroid problem.  Thyroid Problem Presents for follow-up visit. The symptoms have been stable. Her past medical history is significant for hyperlipidemia.  Hyperlipidemia This is a chronic problem. The current  episode started more than 1 year ago. The problem is controlled. Recent lipid tests were reviewed and are normal. Pertinent negatives include no shortness of breath. Current antihyperlipidemic treatment includes statins. The current treatment provides moderate improvement of lipids. Risk factors for coronary artery disease include dyslipidemia, diabetes mellitus, hypertension and a sedentary lifestyle.  Diabetes She presents for her follow-up diabetic visit. She has type 2 diabetes mellitus. There are no hypoglycemic associated symptoms. Pertinent negatives for diabetes include no blurred vision and no foot paresthesias. There are no hypoglycemic complications. Symptoms are stable. Diabetic complications include heart disease. Risk factors for coronary artery disease include dyslipidemia, diabetes mellitus and hypertension. She is following a diabetic diet. Her overall blood glucose range is 130-140 mg/dl.  Arthritis Presents for follow-up visit. She complains of pain and stiffness. Affected locations include the right knee and left knee.  URI  This is a new problem. The current episode started 1 to 4 weeks ago. The problem has been waxing and waning. Associated symptoms include congestion, coughing and rhinorrhea. The treatment provided mild relief.      Review of Systems  Constitutional: Negative for malaise/fatigue.  HENT: Positive for congestion and rhinorrhea.   Eyes: Negative for blurred vision.  Respiratory: Positive for cough. Negative for shortness of breath.   Musculoskeletal: Positive for arthritis and stiffness.     Observations/Objective: No SOB or distress noted  Assessment and Plan: Sheila Ray comes in today with chief complaint of No chief complaint on file.   Diagnosis and orders addressed:  1. Atrial fibrillation, unspecified type ([place])  2. Hypertension associated with diabetes ([place])  3. Type 2 diabetes mellitus with other specified complication, without  long-term current use of insulin ([place])  4. Hyperlipidemia associated with  type 2 diabetes mellitus ([place])  5. Hypothyroidism, unspecified type  6. Primary osteoarthritis of right knee  7. Obesity (BMI 30-39.9)  8. Vitamin D deficiency  9. Encounter for screening for COVID-19 - Novel Coronavirus, NAA ([facility]); Future   Labs pending Health Maintenance reviewed Diet and exercise encouraged  Follow up plan: 6 months       I discussed the assessment and treatment plan with the patient. The patient was provided an opportunity to ask questions and all were answered. The patient agreed with the plan and demonstrated an understanding of the instructions.   The patient was advised to call back or seek an in-person evaluation if the symptoms worsen or if the condition fails to improve as anticipated.  The above assessment and management plan was discussed with the patient. The patient verbalized understanding of and has agreed to the management plan. Patient is aware to call the clinic if symptoms persist or worsen. Patient is aware when to return to the clinic for a follow-up visit. Patient educated on when it is appropriate to go to the emergency department.   Time call ended:  12:42 pm  I provided 17 minutes of non-face-to-face time during this encounter.    [name], FNP

## 2020-03-22 ENCOUNTER — Other Ambulatory Visit: Payer: Medicare Other

## 2020-03-22 DIAGNOSIS — Z1152 Encounter for screening for COVID-19: Secondary | ICD-10-CM | POA: Diagnosis not present

## 2020-03-23 DIAGNOSIS — I10 Essential (primary) hypertension: Secondary | ICD-10-CM | POA: Diagnosis not present

## 2020-03-23 DIAGNOSIS — I4819 Other persistent atrial fibrillation: Secondary | ICD-10-CM | POA: Diagnosis not present

## 2020-03-24 ENCOUNTER — Telehealth: Payer: Self-pay | Admitting: Family

## 2020-03-24 LAB — SARS-COV-2, NAA 2 DAY TAT

## 2020-03-24 LAB — NOVEL CORONAVIRUS, NAA: SARS-CoV-2, NAA: NOT DETECTED

## 2020-03-24 NOTE — Telephone Encounter (Signed)
Pt aware COVID results negative.

## 2020-03-28 DIAGNOSIS — Z8673 Personal history of transient ischemic attack (TIA), and cerebral infarction without residual deficits: Secondary | ICD-10-CM | POA: Diagnosis not present

## 2020-03-28 DIAGNOSIS — Z881 Allergy status to other antibiotic agents status: Secondary | ICD-10-CM | POA: Diagnosis not present

## 2020-03-28 DIAGNOSIS — I10 Essential (primary) hypertension: Secondary | ICD-10-CM | POA: Diagnosis not present

## 2020-03-28 DIAGNOSIS — I517 Cardiomegaly: Secondary | ICD-10-CM | POA: Diagnosis not present

## 2020-03-28 DIAGNOSIS — Z7901 Long term (current) use of anticoagulants: Secondary | ICD-10-CM | POA: Diagnosis not present

## 2020-03-28 DIAGNOSIS — S79911A Unspecified injury of right hip, initial encounter: Secondary | ICD-10-CM | POA: Diagnosis not present

## 2020-03-28 DIAGNOSIS — I4819 Other persistent atrial fibrillation: Secondary | ICD-10-CM | POA: Diagnosis not present

## 2020-03-28 DIAGNOSIS — M5136 Other intervertebral disc degeneration, lumbar region: Secondary | ICD-10-CM | POA: Diagnosis not present

## 2020-03-28 DIAGNOSIS — W19XXXA Unspecified fall, initial encounter: Secondary | ICD-10-CM | POA: Diagnosis not present

## 2020-03-28 DIAGNOSIS — M47816 Spondylosis without myelopathy or radiculopathy, lumbar region: Secondary | ICD-10-CM | POA: Diagnosis not present

## 2020-03-28 DIAGNOSIS — S76011A Strain of muscle, fascia and tendon of right hip, initial encounter: Secondary | ICD-10-CM | POA: Diagnosis not present

## 2020-03-28 DIAGNOSIS — M65851 Other synovitis and tenosynovitis, right thigh: Secondary | ICD-10-CM | POA: Diagnosis not present

## 2020-03-28 DIAGNOSIS — Z043 Encounter for examination and observation following other accident: Secondary | ICD-10-CM | POA: Diagnosis not present

## 2020-03-28 DIAGNOSIS — R531 Weakness: Secondary | ICD-10-CM | POA: Diagnosis not present

## 2020-03-28 DIAGNOSIS — W108XXA Fall (on) (from) other stairs and steps, initial encounter: Secondary | ICD-10-CM | POA: Diagnosis not present

## 2020-03-28 DIAGNOSIS — R1031 Right lower quadrant pain: Secondary | ICD-10-CM | POA: Diagnosis not present

## 2020-03-28 DIAGNOSIS — I081 Rheumatic disorders of both mitral and tricuspid valves: Secondary | ICD-10-CM | POA: Diagnosis not present

## 2020-03-28 DIAGNOSIS — D72829 Elevated white blood cell count, unspecified: Secondary | ICD-10-CM | POA: Diagnosis not present

## 2020-03-28 DIAGNOSIS — E119 Type 2 diabetes mellitus without complications: Secondary | ICD-10-CM | POA: Diagnosis not present

## 2020-03-28 DIAGNOSIS — Z9181 History of falling: Secondary | ICD-10-CM | POA: Diagnosis not present

## 2020-03-28 DIAGNOSIS — Z888 Allergy status to other drugs, medicaments and biological substances status: Secondary | ICD-10-CM | POA: Diagnosis not present

## 2020-03-28 DIAGNOSIS — Z79899 Other long term (current) drug therapy: Secondary | ICD-10-CM | POA: Diagnosis not present

## 2020-03-28 DIAGNOSIS — I4891 Unspecified atrial fibrillation: Secondary | ICD-10-CM | POA: Diagnosis not present

## 2020-03-28 DIAGNOSIS — E785 Hyperlipidemia, unspecified: Secondary | ICD-10-CM | POA: Diagnosis not present

## 2020-03-28 DIAGNOSIS — Z7984 Long term (current) use of oral hypoglycemic drugs: Secondary | ICD-10-CM | POA: Diagnosis not present

## 2020-03-29 DIAGNOSIS — I4891 Unspecified atrial fibrillation: Secondary | ICD-10-CM | POA: Diagnosis not present

## 2020-03-30 DIAGNOSIS — I4891 Unspecified atrial fibrillation: Secondary | ICD-10-CM | POA: Diagnosis not present

## 2020-04-03 DIAGNOSIS — I4819 Other persistent atrial fibrillation: Secondary | ICD-10-CM | POA: Diagnosis not present

## 2020-04-03 DIAGNOSIS — I10 Essential (primary) hypertension: Secondary | ICD-10-CM | POA: Diagnosis not present

## 2020-04-03 DIAGNOSIS — I340 Nonrheumatic mitral (valve) insufficiency: Secondary | ICD-10-CM | POA: Diagnosis not present

## 2020-04-03 DIAGNOSIS — I361 Nonrheumatic tricuspid (valve) insufficiency: Secondary | ICD-10-CM | POA: Diagnosis not present

## 2020-04-21 ENCOUNTER — Ambulatory Visit (INDEPENDENT_AMBULATORY_CARE_PROVIDER_SITE_OTHER): Payer: Medicare Other | Admitting: *Deleted

## 2020-04-21 VITALS — Ht 64.5 in | Wt 175.0 lb

## 2020-04-21 DIAGNOSIS — Z0000 Encounter for general adult medical examination without abnormal findings: Secondary | ICD-10-CM | POA: Diagnosis not present

## 2020-04-21 NOTE — Progress Notes (Addendum)
MEDICARE ANNUAL WELLNESS VISIT  04/21/2020  Telephone Visit Disclaimer This Medicare AWV was conducted by telephone due to national recommendations for restrictions regarding the COVID-19 Pandemic (e.g. social distancing).  I verified, using two identifiers, that I am speaking with Sheila Ray or their authorized healthcare agent. I discussed the limitations, risks, security, and privacy concerns of performing an evaluation and management service by telephone and the potential availability of an in-person appointment in the future. The patient expressed understanding and agreed to proceed.  Location of Patient: Home Location of Provider (nurse):  Office  Subjective:    Sheila Ray is a 79 y.o. female patient of Hawks, Theador Hawthorne, FNP who had a [employer] Visit today via telephone. Sheila Ray is Retired and lives alone. she has 1 child. she reports that she is socially active and does interact with friends/family regularly. she is minimally physically active and enjoys Shopping.  Patient Care Team: Sharion Balloon, FNP as PCP - General (Family Medicine) Gaynelle Arabian, MD as Consulting Physician (Orthopedic Surgery) Clent Jacks, MD as Consulting Physician (Ophthalmology) Trula Slade, MD as Referring Physician (Cardiology)  Advanced Directives 04/21/2020 02/07/2020 04/13/2019 02/06/2018 01/03/2017 12/06/2014 09/01/2014  Does Patient Have a Medical Advance Directive? No No No No No No No  Would patient like information on creating a medical advance directive? Yes (MAU/Ambulatory/Procedural Areas - Information given) No - Patient declined No - Patient declined No - Patient declined Yes (MAU/Ambulatory/Procedural Areas - Information given) Yes - Educational materials given -    Hospital Utilization Over the Past 12 Months: # of hospitalizations or ER visits: 2 # of surgeries: 0  Review of Systems    Patient reports that her overall health is unchanged compared to last  year.  History obtained from chart review and the patient General ROS: negative  Patient Reported Readings (BP, Pulse, CBG, Weight, etc) none  Pain Assessment Pain : 0-10 Pain Score: 7  Pain Type: Acute pain Pain Location: Hip Pain Orientation: Right Pain Descriptors / Indicators: Discomfort Pain Onset: More than a month ago Pain Frequency: Constant Pain Relieving Factors: Tylenol  Pain Relieving Factors: Tylenol  Current Medications & Allergies (verified) Allergies as of 04/21/2020      Reactions   Crestor [rosuvastatin]    Cramps   Lipitor [atorvastatin]    Cramps   Pravastatin Other (See Comments)   Cramps    Keflex [cephalexin] Hives      Medication List       Accurate as of April 21, 2020 10:58 AM. If you have any questions, ask your nurse or doctor.        alendronate 70 MG tablet Commonly known as: FOSAMAX TAKE 1 TABLET BY MOUTH  WEEKLY AS DIRECTED   diltiazem 240 MG 24 hr capsule Commonly known as: CARDIZEM CD TAKE 1 CAPSULE BY MOUTH  DAILY   glucose blood test strip Commonly known as: ONE TOUCH ULTRA TEST USE TO CHECK BLOOD GLUCOSE  ONCE A DAY AS INSTRUCTED   lisinopril 40 MG tablet Commonly known as: ZESTRIL TAKE 1 TABLET BY MOUTH  DAILY   metFORMIN 1000 MG tablet Commonly known as: GLUCOPHAGE Take 0.5 tablets (500 mg total) by mouth daily with breakfast.   metoprolol succinate 50 MG 24 hr tablet Commonly known as: TOPROL-XL TAKE 1 TABLET BY MOUTH  DAILY WITH OR IMMEDIATELY  FOLLOWING A MEAL   OneTouch Delica Lancets 37T Misc 1 each by Does not apply route daily. Use to check BG  daily   Vitamin B12 1000 MCG Tbcr Take by mouth.   Xarelto 20 MG Tabs tablet Generic drug: rivaroxaban TAKE 1 TABLET BY MOUTH  DAILY       History (reviewed): Past Medical History:  Diagnosis Date  . Arthritis   . Atrial fibrillation ([place])   . Complication of anesthesia   . Diabetes mellitus without complication ([place])   . Diverticulosis   .  Fatigue   . Gastric polyp   . Goiter   . Hyperlipidemia   . Hypertension   . Hypothyroid    taken off of thyroid medication 2012014   . Obese   . Osteopenia   . PONV (postoperative nausea and vomiting)   . Postmenopausal   . Rosacea   . 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 Vitamin D deficiency    Past Surgical History:  Procedure Laterality Date  . ABDOMINAL HYSTERECTOMY  1985  . FOOT SURGERY Right 08/2002  . right knee arthroscopy   2013   . TONSILLECTOMY    . 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 Osteoporosis Mother   . Alzheimer's disease Mother 61  . Arthritis Mother   . Cancer Father        Lung  . Arthritis Sister   . Obesity Sister   . Heart attack Brother 63  . Heart disease Son   . Arthritis Brother   . Arthritis Sister   . Cancer Sister        breast cancer   Social History   Socioeconomic History  . Marital status: Divorced    Spouse name: Not on file  . Number of children: 1  . Years of education: 45  . Highest education level: 12th grade  Occupational History  . Occupation: retired  Tobacco Use  . Smoking status: Never Smoker  . Smokeless tobacco: Never Used  Vaping Use  . Vaping Use: Never used  Substance and Sexual Activity  . Alcohol use: No  . Drug use: No  . Sexual activity: Not Currently  Other Topics Concern  . Not on file  Social History Narrative  . Not on file   Social Determinants of Health   Financial Resource Strain: Low Risk   . Difficulty of Paying Living Expenses: Not hard at all  Food Insecurity: No Food Insecurity  . Worried About Charity fundraiser in the Last Year: Never true  . Ran Out of Food in the Last Year: Never true  Transportation Needs: No Transportation Needs  . Lack of Transportation  (Medical): No  . Lack of Transportation (Non-Medical): No  Physical Activity: Insufficiently Active  . Days of Exercise per Week: 2 days  . Minutes of Exercise per Session: 20 min  Stress: No Stress Concern Present  . Feeling of Stress : Not at all  Social Connections: Moderately Integrated  . Frequency of Communication with Friends and Family: More than three times a week  . Frequency of Social Gatherings with Friends and Family: More than three times a week  . Attends Religious Services: More than 4 times per year  . Active Member of Clubs or Organizations: Yes  . Attends Archivist Meetings: More than 4 times per year  . Marital Status: Divorced    Activities  of Daily Living In your present state of health, do you have any difficulty performing the following activities: 04/21/2020  Hearing? Y  Vision? N  Difficulty concentrating or making decisions? N  Walking or climbing stairs? N  Dressing or bathing? N  Doing errands, shopping? N  Preparing Food and eating ? N  Using the Toilet? N  In the past six months, have you accidently leaked urine? N  Do you have problems with loss of bowel control? N  Managing your Medications? N  Managing your Finances? N  Housekeeping or managing your Housekeeping? N  Some recent data might be hidden    Patient Education/ Literacy How often do you need to have someone help you when you read instructions, pamphlets, or other written materials from your doctor or pharmacy?: 1 - Never What is the last grade level you completed in school?: 12th Grade  Exercise Current Exercise Habits: Home exercise routine, Type of exercise: walking, Time (Minutes): 15, Frequency (Times/Week): 2, Weekly Exercise (Minutes/Week): 30, Intensity: Mild  Diet Patient reports consuming 3 meals a day and 1 snack(s) a day Patient reports that her primary diet is: Regular Patient reports that she does have regular access to food.   Depression Screen PHQ 2/9  Scores 04/21/2020 02/07/2020 12/28/2019 09/16/2019 04/13/2019 03/12/2019 11/09/2018  PHQ - 2 Score 0 0 0 0 0 0 0     Fall Risk Fall Risk  04/21/2020 02/07/2020 12/28/2019 12/28/2019 12/28/2019  Falls in the past year? 1 1 1  0 0  Number falls in past yr: 1 0 0 - -  Injury with Fall? 1 1 1  - -  Risk for fall due to : History of fall(s) Impaired balance/gait Impaired balance/gait - -  Follow up Falls evaluation completed Education provided Education provided - -     Objective:  Sheila Ray seemed alert and oriented and she participated appropriately during our telephone visit.  Blood Pressure Weight BMI  BP Readings from Last 3 Encounters:  02/07/20 137/84  02/07/20 114/85  12/28/19 130/77   Wt Readings from Last 3 Encounters:  04/21/20 175 lb 0.7 oz (79.4 kg)  02/07/20 177 lb (80.3 kg)  02/07/20 175 lb (79.4 kg)   BMI Readings from Last 1 Encounters:  04/21/20 29.58 kg/m    *Unable to obtain current vital signs, weight, and BMI due to telephone visit type  Hearing/Vision  . Sheila Ray did  seem to have difficulty with hearing/understanding during the telephone conversation . Reports that she has not had a formal eye exam by an eye care professional within the past year . Reports that she has not had a formal hearing evaluation within the past year *Unable to fully assess hearing and vision during telephone visit type  Cognitive Function: 6CIT Screen 04/21/2020 04/13/2019  What Year? 0 points 0 points  What month? 0 points 0 points  What time? 0 points 0 points  Count back from 20 2 points 0 points  Months in reverse 2 points 2 points  Repeat phrase 2 points 0 points  Total Score 6 2   (Normal:0-7, Significant for Dysfunction: >8)  Normal Cognitive Function Screening: Yes   Immunization & Health Maintenance Record Immunization History  Administered Date(s) Administered  . Fluad Quad(high Dose 65+) 03/12/2019  . Influenza Whole 05/16/2012  . Influenza, High Dose Seasonal PF  05/05/2017, 05/27/2018  . Influenza,inj,Quad PF,6+ Mos 03/01/2013, 04/06/2014, 05/19/2015  . Pneumococcal Conjugate-13 12/06/2014  . Pneumococcal Polysaccharide-23 11/29/2005  . Td 09/14/2002, 04/29/2015  .  Zoster 04/17/2011  . Zoster Recombinat (Shingrix) 02/06/2018, 02/06/2018, 03/20/2018    Health Maintenance  Topic Date Due  . Hepatitis C Screening  Never done  . COVID-19 Vaccine (1) Never done  . HEMOGLOBIN A1C  09/09/2019  . FOOT EXAM  10/29/2019  . OPHTHALMOLOGY EXAM  01/11/2020  . INFLUENZA VACCINE  01/30/2020  . MAMMOGRAM  06/10/2020  . TETANUS/TDAP  04/28/2025  . DEXA SCAN  Completed  . PNA vac Low Risk Adult  Completed       Assessment  This is a routine wellness examination for Sheila Ray.  Health Maintenance: Due or Overdue Health Maintenance Due  Topic Date Due  . Hepatitis C Screening  Never done  . COVID-19 Vaccine (1) Never done  . HEMOGLOBIN A1C  09/09/2019  . FOOT EXAM  10/29/2019  . OPHTHALMOLOGY EXAM  01/11/2020  . INFLUENZA VACCINE  01/30/2020    Sheila Ray does not need a referral for Community Assistance: Care Management:   no Social Work:    no Prescription Assistance:  no Nutrition/Diabetes Education:  no   Plan:  Personalized Goals Goals Addressed            This Visit's Progress   . Exercise 3x per week (30 min per time)       04/21/2020 AWV Goal: Exercise for General Health   Patient will verbalize understanding of the benefits of increased physical activity:  Exercising regularly is important. It will improve your overall fitness, flexibility, and endurance.  Regular exercise also will improve your overall health. It can help you control your weight, reduce stress, and improve your bone density.  Over the next year, patient will increase physical activity as tolerated with a goal of at least 150 minutes of moderate physical activity per week.   You can tell that you are exercising at a moderate intensity if your  heart starts beating faster and you start breathing faster but can still hold a conversation.  Moderate-intensity exercise ideas include:  Walking 1 mile (1.6 km) in about 15 minutes  Biking  Hiking  Golfing  Dancing  Water aerobics  Patient will verbalize understanding of everyday activities that increase physical activity by providing examples like the following: ? Yard work, such as: ? Pushing a Conservation officer, nature ? Raking and bagging leaves ? Washing your car ? Pushing a stroller ? Shoveling snow ? Gardening ? Washing windows or floors  Patient will be able to explain general safety guidelines for exercising:   Before you start a new exercise program, talk with your health care provider.  Do not exercise so much that you hurt yourself, feel dizzy, or get very short of breath.  Wear comfortable clothes and wear shoes with good support.  Drink plenty of water while you exercise to prevent dehydration or heat stroke.  Work out until your breathing and your heartbeat get faster.       Personalized Health Maintenance & Screening Recommendations  Influenza vaccine Diabetes screening Glaucoma screening Advanced directives: has NO advanced directive  - add't info requested. Referral to SW: no  Lung Cancer Screening Recommended: no (Low Dose CT Chest recommended if Age 20-80 years, 30 pack-year currently smoking OR have quit w/in past 15 years) Hepatitis C Screening recommended: no HIV Screening recommended: yes  Advanced Directives: Written information was not prepared per patient's request.  Referrals & Orders No orders of the defined types were placed in this encounter.   Follow-up Plan . Follow-up with [name]  A, FNP as planned   I have personally reviewed and noted the following in the patient's chart:   . Medical and social history . Use of alcohol, tobacco or illicit drugs  . Current medications and supplements . Functional ability and  status . Nutritional status . Physical activity . Advanced directives . List of other physicians . Hospitalizations, surgeries, and ER visits in previous 12 months . Vitals . Screenings to include cognitive, depression, and falls . Referrals and appointments  In addition, I have reviewed and discussed with Lacresia L Hargrove certain preventive protocols, quality metrics, and best practice recommendations. A written personalized care plan for preventive services as well as general preventive health recommendations is available and can be mailed to the patient at her request.      Wardell Heath, LPN  75/17/0017   AVS Printed and mailed to patient    I have reviewed and agree with the above AWV documentation Caryl Pina, MD [facility] 04/21/2020, 12:57 PM

## 2020-04-21 NOTE — Patient Instructions (Signed)
[place] Maintenance Summary and Written Plan of Care  [name] ,  Thank you for allowing me to perform your Medicare Annual Wellness Visit and for your ongoing commitment to your health.   Health Maintenance & Immunization History Health Maintenance  Topic Date Due  . Hepatitis C Screening  Never done  . COVID-19 Vaccine (1) Never done  . HEMOGLOBIN A1C  09/09/2019  . FOOT EXAM  10/29/2019  . OPHTHALMOLOGY EXAM  01/11/2020  . INFLUENZA VACCINE  01/30/2020  . MAMMOGRAM  06/10/2020  . TETANUS/TDAP  04/28/2025  . DEXA SCAN  Completed  . PNA vac Low Risk Adult  Completed   Immunization History  Administered Date(s) Administered  . Fluad Quad(high Dose 65+) 03/12/2019  . Influenza Whole 05/16/2012  . Influenza, High Dose Seasonal PF 05/05/2017, 05/27/2018  . Influenza,inj,Quad PF,6+ Mos 03/01/2013, 04/06/2014, 05/19/2015  . Pneumococcal Conjugate-13 12/06/2014  . Pneumococcal Polysaccharide-23 11/29/2005  . Td 09/14/2002, 04/29/2015  . Zoster 04/17/2011  . Zoster Recombinat (Shingrix) 02/06/2018, 02/06/2018, 03/20/2018    These are the patient goals that we discussed: Goals Addressed            This Visit's Progress   . Exercise 3x per week (30 min per time)       04/21/2020 AWV Goal: Exercise for General Health   Patient will verbalize understanding of the benefits of increased physical activity:  Exercising regularly is important. It will improve your overall fitness, flexibility, and endurance.  Regular exercise also will improve your overall health. It can help you control your weight, reduce stress, and improve your bone density.  Over the next year, patient will increase physical activity as tolerated with a goal of at least 150 minutes of moderate physical activity per week.   You can tell that you are exercising at a moderate intensity if your heart starts beating faster and you start breathing faster but can still hold a  conversation.  Moderate-intensity exercise ideas include:  Walking 1 mile (1.6 km) in about 15 minutes  Biking  Hiking  Golfing  Dancing  Water aerobics  Patient will verbalize understanding of everyday activities that increase physical activity by providing examples like the following: ? Yard work, such as: ? Pushing a Conservation officer, nature ? Raking and bagging leaves ? Washing your car ? Pushing a stroller ? Shoveling snow ? Gardening ? Washing windows or floors  Patient will be able to explain general safety guidelines for exercising:   Before you start a new exercise program, talk with your health care provider.  Do not exercise so much that you hurt yourself, feel dizzy, or get very short of breath.  Wear comfortable clothes and wear shoes with good support.  Drink plenty of water while you exercise to prevent dehydration or heat stroke.  Work out until your breathing and your heartbeat get faster.         This is a list of Health Maintenance Items that are overdue or due now: Health Maintenance Due  Topic Date Due  . Hepatitis C Screening  Never done  . COVID-19 Vaccine (1) Never done  . HEMOGLOBIN A1C  09/09/2019  . FOOT EXAM  10/29/2019  . OPHTHALMOLOGY EXAM  01/11/2020  . INFLUENZA VACCINE  01/30/2020     Orders/Referrals Placed Today: No orders of the defined types were placed in this encounter.  (Contact our referral department at [phone] if you have not spoken with someone about your referral appointment within the next  5 days)    Follow-up Plan

## 2020-05-03 ENCOUNTER — Inpatient Hospital Stay (HOSPITAL_COMMUNITY): Payer: Medicare Other

## 2020-05-03 ENCOUNTER — Inpatient Hospital Stay (HOSPITAL_COMMUNITY): Payer: Medicare Other | Admitting: Certified Registered Nurse Anesthetist

## 2020-05-03 ENCOUNTER — Ambulatory Visit (INDEPENDENT_AMBULATORY_CARE_PROVIDER_SITE_OTHER): Payer: Medicare Other | Admitting: Family Medicine

## 2020-05-03 ENCOUNTER — Emergency Department (HOSPITAL_COMMUNITY): Payer: Medicare Other

## 2020-05-03 ENCOUNTER — Encounter (HOSPITAL_COMMUNITY): Payer: Self-pay

## 2020-05-03 ENCOUNTER — Inpatient Hospital Stay (HOSPITAL_COMMUNITY)
Admission: EM | Admit: 2020-05-03 | Discharge: 2020-05-15 | DRG: 024 | Disposition: A | Discharge status: Rehabilitation Facility | Payer: Medicare Other | Source: Ambulatory Visit | Attending: Neurology | Admitting: Neurology

## 2020-05-03 ENCOUNTER — Encounter (HOSPITAL_COMMUNITY)
Admission: EM | Disposition: A | Discharge status: Rehabilitation Facility | Payer: Self-pay | Source: Ambulatory Visit | Attending: Neurology

## 2020-05-03 ENCOUNTER — Encounter: Payer: Self-pay | Admitting: Family Medicine

## 2020-05-03 VITALS — BP 170/90 | HR 176 | Temp 98.0°F

## 2020-05-03 DIAGNOSIS — Z8679 Personal history of other diseases of the circulatory system: Secondary | ICD-10-CM | POA: Diagnosis not present

## 2020-05-03 DIAGNOSIS — I63512 Cerebral infarction due to unspecified occlusion or stenosis of left middle cerebral artery: Secondary | ICD-10-CM | POA: Diagnosis not present

## 2020-05-03 DIAGNOSIS — I4891 Unspecified atrial fibrillation: Secondary | ICD-10-CM | POA: Diagnosis not present

## 2020-05-03 DIAGNOSIS — K5901 Slow transit constipation: Secondary | ICD-10-CM | POA: Diagnosis not present

## 2020-05-03 DIAGNOSIS — Z7901 Long term (current) use of anticoagulants: Secondary | ICD-10-CM

## 2020-05-03 DIAGNOSIS — R739 Hyperglycemia, unspecified: Secondary | ICD-10-CM | POA: Diagnosis not present

## 2020-05-03 DIAGNOSIS — I4821 Permanent atrial fibrillation: Secondary | ICD-10-CM | POA: Diagnosis present

## 2020-05-03 DIAGNOSIS — E1169 Type 2 diabetes mellitus with other specified complication: Secondary | ICD-10-CM | POA: Diagnosis not present

## 2020-05-03 DIAGNOSIS — N179 Acute kidney failure, unspecified: Secondary | ICD-10-CM | POA: Diagnosis not present

## 2020-05-03 DIAGNOSIS — Z8262 Family history of osteoporosis: Secondary | ICD-10-CM

## 2020-05-03 DIAGNOSIS — I248 Other forms of acute ischemic heart disease: Secondary | ICD-10-CM | POA: Diagnosis not present

## 2020-05-03 DIAGNOSIS — D72829 Elevated white blood cell count, unspecified: Secondary | ICD-10-CM | POA: Diagnosis not present

## 2020-05-03 DIAGNOSIS — G459 Transient cerebral ischemic attack, unspecified: Secondary | ICD-10-CM | POA: Diagnosis not present

## 2020-05-03 DIAGNOSIS — E039 Hypothyroidism, unspecified: Secondary | ICD-10-CM | POA: Diagnosis not present

## 2020-05-03 DIAGNOSIS — Z6829 Body mass index (BMI) 29.0-29.9, adult: Secondary | ICD-10-CM | POA: Diagnosis not present

## 2020-05-03 DIAGNOSIS — Z7984 Long term (current) use of oral hypoglycemic drugs: Secondary | ICD-10-CM

## 2020-05-03 DIAGNOSIS — E559 Vitamin D deficiency, unspecified: Secondary | ICD-10-CM | POA: Diagnosis not present

## 2020-05-03 DIAGNOSIS — R001 Bradycardia, unspecified: Secondary | ICD-10-CM | POA: Diagnosis not present

## 2020-05-03 DIAGNOSIS — R531 Weakness: Secondary | ICD-10-CM | POA: Diagnosis not present

## 2020-05-03 DIAGNOSIS — I6389 Other cerebral infarction: Secondary | ICD-10-CM | POA: Diagnosis not present

## 2020-05-03 DIAGNOSIS — R0689 Other abnormalities of breathing: Secondary | ICD-10-CM | POA: Diagnosis not present

## 2020-05-03 DIAGNOSIS — J90 Pleural effusion, not elsewhere classified: Secondary | ICD-10-CM | POA: Diagnosis not present

## 2020-05-03 DIAGNOSIS — I639 Cerebral infarction, unspecified: Principal | ICD-10-CM

## 2020-05-03 DIAGNOSIS — I959 Hypotension, unspecified: Secondary | ICD-10-CM | POA: Diagnosis present

## 2020-05-03 DIAGNOSIS — M810 Age-related osteoporosis without current pathological fracture: Secondary | ICD-10-CM | POA: Diagnosis present

## 2020-05-03 DIAGNOSIS — R0902 Hypoxemia: Secondary | ICD-10-CM | POA: Diagnosis not present

## 2020-05-03 DIAGNOSIS — I499 Cardiac arrhythmia, unspecified: Secondary | ICD-10-CM | POA: Diagnosis not present

## 2020-05-03 DIAGNOSIS — R0602 Shortness of breath: Secondary | ICD-10-CM

## 2020-05-03 DIAGNOSIS — Z96651 Presence of right artificial knee joint: Secondary | ICD-10-CM | POA: Diagnosis present

## 2020-05-03 DIAGNOSIS — I63412 Cerebral infarction due to embolism of left middle cerebral artery: Principal | ICD-10-CM | POA: Diagnosis present

## 2020-05-03 DIAGNOSIS — I152 Hypertension secondary to endocrine disorders: Secondary | ICD-10-CM | POA: Diagnosis not present

## 2020-05-03 DIAGNOSIS — Z90710 Acquired absence of both cervix and uterus: Secondary | ICD-10-CM

## 2020-05-03 DIAGNOSIS — Z8249 Family history of ischemic heart disease and other diseases of the circulatory system: Secondary | ICD-10-CM

## 2020-05-03 DIAGNOSIS — G9349 Other encephalopathy: Secondary | ICD-10-CM | POA: Diagnosis present

## 2020-05-03 DIAGNOSIS — G8191 Hemiplegia, unspecified affecting right dominant side: Secondary | ICD-10-CM | POA: Diagnosis present

## 2020-05-03 DIAGNOSIS — I340 Nonrheumatic mitral (valve) insufficiency: Secondary | ICD-10-CM | POA: Diagnosis not present

## 2020-05-03 DIAGNOSIS — I4811 Longstanding persistent atrial fibrillation: Secondary | ICD-10-CM

## 2020-05-03 DIAGNOSIS — Z9889 Other specified postprocedural states: Secondary | ICD-10-CM | POA: Diagnosis not present

## 2020-05-03 DIAGNOSIS — Z8673 Personal history of transient ischemic attack (TIA), and cerebral infarction without residual deficits: Secondary | ICD-10-CM

## 2020-05-03 DIAGNOSIS — R600 Localized edema: Secondary | ICD-10-CM | POA: Diagnosis not present

## 2020-05-03 DIAGNOSIS — E669 Obesity, unspecified: Secondary | ICD-10-CM | POA: Diagnosis present

## 2020-05-03 DIAGNOSIS — L03116 Cellulitis of left lower limb: Secondary | ICD-10-CM | POA: Diagnosis not present

## 2020-05-03 DIAGNOSIS — R4701 Aphasia: Secondary | ICD-10-CM | POA: Diagnosis not present

## 2020-05-03 DIAGNOSIS — G9340 Encephalopathy, unspecified: Secondary | ICD-10-CM

## 2020-05-03 DIAGNOSIS — E785 Hyperlipidemia, unspecified: Secondary | ICD-10-CM | POA: Diagnosis present

## 2020-05-03 DIAGNOSIS — R6889 Other general symptoms and signs: Secondary | ICD-10-CM | POA: Diagnosis not present

## 2020-05-03 DIAGNOSIS — E860 Dehydration: Secondary | ICD-10-CM | POA: Diagnosis present

## 2020-05-03 DIAGNOSIS — Z79899 Other long term (current) drug therapy: Secondary | ICD-10-CM

## 2020-05-03 DIAGNOSIS — R339 Retention of urine, unspecified: Secondary | ICD-10-CM | POA: Diagnosis not present

## 2020-05-03 DIAGNOSIS — R29706 NIHSS score 6: Secondary | ICD-10-CM | POA: Diagnosis not present

## 2020-05-03 DIAGNOSIS — E1165 Type 2 diabetes mellitus with hyperglycemia: Secondary | ICD-10-CM | POA: Diagnosis not present

## 2020-05-03 DIAGNOSIS — Z20822 Contact with and (suspected) exposure to covid-19: Secondary | ICD-10-CM | POA: Diagnosis present

## 2020-05-03 DIAGNOSIS — Z743 Need for continuous supervision: Secondary | ICD-10-CM | POA: Diagnosis not present

## 2020-05-03 DIAGNOSIS — Z8261 Family history of arthritis: Secondary | ICD-10-CM

## 2020-05-03 DIAGNOSIS — I63521 Cerebral infarction due to unspecified occlusion or stenosis of right anterior cerebral artery: Secondary | ICD-10-CM | POA: Diagnosis not present

## 2020-05-03 DIAGNOSIS — R29810 Facial weakness: Secondary | ICD-10-CM | POA: Diagnosis present

## 2020-05-03 DIAGNOSIS — Z23 Encounter for immunization: Secondary | ICD-10-CM | POA: Diagnosis present

## 2020-05-03 DIAGNOSIS — T45515A Adverse effect of anticoagulants, initial encounter: Secondary | ICD-10-CM | POA: Diagnosis present

## 2020-05-03 DIAGNOSIS — R791 Abnormal coagulation profile: Secondary | ICD-10-CM | POA: Diagnosis present

## 2020-05-03 DIAGNOSIS — M7989 Other specified soft tissue disorders: Secondary | ICD-10-CM | POA: Diagnosis not present

## 2020-05-03 DIAGNOSIS — R609 Edema, unspecified: Secondary | ICD-10-CM | POA: Diagnosis not present

## 2020-05-03 DIAGNOSIS — Z820 Family history of epilepsy and other diseases of the nervous system: Secondary | ICD-10-CM

## 2020-05-03 DIAGNOSIS — E871 Hypo-osmolality and hyponatremia: Secondary | ICD-10-CM | POA: Diagnosis not present

## 2020-05-03 DIAGNOSIS — I5032 Chronic diastolic (congestive) heart failure: Secondary | ICD-10-CM | POA: Diagnosis not present

## 2020-05-03 DIAGNOSIS — G936 Cerebral edema: Secondary | ICD-10-CM | POA: Diagnosis not present

## 2020-05-03 DIAGNOSIS — I6523 Occlusion and stenosis of bilateral carotid arteries: Secondary | ICD-10-CM | POA: Diagnosis not present

## 2020-05-03 DIAGNOSIS — N319 Neuromuscular dysfunction of bladder, unspecified: Secondary | ICD-10-CM | POA: Diagnosis not present

## 2020-05-03 DIAGNOSIS — Z803 Family history of malignant neoplasm of breast: Secondary | ICD-10-CM

## 2020-05-03 DIAGNOSIS — I6602 Occlusion and stenosis of left middle cerebral artery: Secondary | ICD-10-CM | POA: Diagnosis not present

## 2020-05-03 DIAGNOSIS — I69351 Hemiplegia and hemiparesis following cerebral infarction affecting right dominant side: Secondary | ICD-10-CM | POA: Diagnosis not present

## 2020-05-03 DIAGNOSIS — Z7983 Long term (current) use of bisphosphonates: Secondary | ICD-10-CM

## 2020-05-03 DIAGNOSIS — I10 Essential (primary) hypertension: Secondary | ICD-10-CM | POA: Diagnosis not present

## 2020-05-03 DIAGNOSIS — E876 Hypokalemia: Secondary | ICD-10-CM | POA: Diagnosis not present

## 2020-05-03 DIAGNOSIS — I509 Heart failure, unspecified: Secondary | ICD-10-CM | POA: Diagnosis not present

## 2020-05-03 DIAGNOSIS — R29818 Other symptoms and signs involving the nervous system: Secondary | ICD-10-CM | POA: Diagnosis not present

## 2020-05-03 DIAGNOSIS — R404 Transient alteration of awareness: Secondary | ICD-10-CM | POA: Diagnosis not present

## 2020-05-03 DIAGNOSIS — N390 Urinary tract infection, site not specified: Secondary | ICD-10-CM | POA: Diagnosis not present

## 2020-05-03 DIAGNOSIS — I4819 Other persistent atrial fibrillation: Secondary | ICD-10-CM | POA: Diagnosis not present

## 2020-05-03 DIAGNOSIS — I361 Nonrheumatic tricuspid (valve) insufficiency: Secondary | ICD-10-CM | POA: Diagnosis not present

## 2020-05-03 HISTORY — PX: RADIOLOGY WITH ANESTHESIA: SHX6223

## 2020-05-03 HISTORY — PX: IR PERCUTANEOUS ART THROMBECTOMY/INFUSION INTRACRANIAL INC DIAG ANGIO: IMG6087

## 2020-05-03 HISTORY — PX: IR CT HEAD LTD: IMG2386

## 2020-05-03 LAB — CBC
HCT: 37.6 % (ref 36.0–46.0)
HCT: 36 % (ref 36.0–46.0)
Hemoglobin: 11.5 g/dL — ABNORMAL LOW (ref 12.0–15.0)
Hemoglobin: 10.6 g/dL — ABNORMAL LOW (ref 12.0–15.0)
MCH: 23.8 pg — ABNORMAL LOW (ref 26.0–34.0)
MCH: 23.1 pg — ABNORMAL LOW (ref 26.0–34.0)
MCHC: 30.6 g/dL (ref 30.0–36.0)
MCHC: 29.4 g/dL — ABNORMAL LOW (ref 30.0–36.0)
MCV: 77.8 fL — ABNORMAL LOW (ref 80.0–100.0)
MCV: 78.6 fL — ABNORMAL LOW (ref 80.0–100.0)
Platelets: 338 10*3/uL (ref 150–400)
Platelets: 414 10*3/uL — ABNORMAL HIGH (ref 150–400)
RBC: 4.83 MIL/uL (ref 3.87–5.11)
RBC: 4.58 MIL/uL (ref 3.87–5.11)
RDW: 16.9 % — ABNORMAL HIGH (ref 11.5–15.5)
RDW: 16.9 % — ABNORMAL HIGH (ref 11.5–15.5)
WBC: 13.4 10*3/uL — ABNORMAL HIGH (ref 4.0–10.5)
WBC: 15.6 10*3/uL — ABNORMAL HIGH (ref 4.0–10.5)
nRBC: 0 % (ref 0.0–0.2)
nRBC: 0 % (ref 0.0–0.2)

## 2020-05-03 LAB — COMPREHENSIVE METABOLIC PANEL
ALT: 12 U/L (ref 0–44)
AST: 15 U/L (ref 15–41)
Albumin: 3 g/dL — ABNORMAL LOW (ref 3.5–5.0)
Alkaline Phosphatase: 52 U/L (ref 38–126)
Anion gap: 7 (ref 5–15)
BUN: 11 mg/dL (ref 8–23)
CO2: 22 mmol/L (ref 22–32)
Calcium: 8.6 mg/dL — ABNORMAL LOW (ref 8.9–10.3)
Chloride: 102 mmol/L (ref 98–111)
Creatinine, Ser: 0.64 mg/dL (ref 0.44–1.00)
GFR, Estimated: >60 mL/min (ref >60)
Glucose, Bld: 132 mg/dL — ABNORMAL HIGH (ref 70–99)
Potassium: 3.7 mmol/L (ref 3.5–5.1)
Sodium: 131 mmol/L — ABNORMAL LOW (ref 135–145)
Total Bilirubin: 0.5 mg/dL (ref 0.3–1.2)
Total Protein: 5.7 g/dL — ABNORMAL LOW (ref 6.5–8.1)

## 2020-05-03 LAB — PROTIME-INR
INR: 1.2 (ref 0.8–1.2)
Prothrombin Time: 14.6 seconds (ref 11.4–15.2)

## 2020-05-03 LAB — RESP PANEL BY RT PCR (RSV, FLU A&B, COVID)
Influenza A by PCR: NEGATIVE
Influenza B by PCR: NEGATIVE
Respiratory Syncytial Virus by PCR: NEGATIVE
SARS Coronavirus 2 by RT PCR: NEGATIVE

## 2020-05-03 LAB — TSH: TSH: 1.637 u[IU]/mL (ref 0.350–4.500)

## 2020-05-03 LAB — DIFFERENTIAL
Abs Immature Granulocytes: 0.04 10*3/uL (ref 0.00–0.07)
Basophils Absolute: 0.1 10*3/uL (ref 0.0–0.1)
Basophils Relative: 1 %
Eosinophils Absolute: 0.2 10*3/uL (ref 0.0–0.5)
Eosinophils Relative: 1 %
Immature Granulocytes: 0 %
Lymphocytes Relative: 10 %
Lymphs Abs: 1.3 10*3/uL (ref 0.7–4.0)
Monocytes Absolute: 0.9 10*3/uL (ref 0.1–1.0)
Monocytes Relative: 7 %
Neutro Abs: 10.9 10*3/uL — ABNORMAL HIGH (ref 1.7–7.7)
Neutrophils Relative %: 81 %

## 2020-05-03 LAB — CBG MONITORING, ED
Glucose-Capillary: 127 mg/dL — ABNORMAL HIGH (ref 70–99)
Glucose-Capillary: 131 mg/dL — ABNORMAL HIGH (ref 70–99)

## 2020-05-03 LAB — APTT: aPTT: 32 seconds (ref 24–36)

## 2020-05-03 LAB — I-STAT CHEM 8, ED
BUN: 9 mg/dL (ref 8–23)
Calcium, Ion: 1.16 mmol/L (ref 1.15–1.40)
Chloride: 101 mmol/L (ref 98–111)
Creatinine, Ser: 0.6 mg/dL (ref 0.44–1.00)
Glucose, Bld: 127 mg/dL — ABNORMAL HIGH (ref 70–99)
HCT: 33 % — ABNORMAL LOW (ref 36.0–46.0)
Hemoglobin: 11.2 g/dL — ABNORMAL LOW (ref 12.0–15.0)
Potassium: 3.8 mmol/L (ref 3.5–5.1)
Sodium: 136 mmol/L (ref 135–145)
TCO2: 22 mmol/L (ref 22–32)

## 2020-05-03 LAB — MRSA PCR SCREENING: MRSA by PCR: NEGATIVE

## 2020-05-03 LAB — GLUCOSE, CAPILLARY
Glucose-Capillary: 234 mg/dL — ABNORMAL HIGH (ref 70–99)
Glucose-Capillary: 171 mg/dL — ABNORMAL HIGH (ref 70–99)

## 2020-05-03 LAB — ETHANOL: Alcohol, Ethyl (B): <10 mg/dL (ref <10)

## 2020-05-03 IMAGING — MR MR HEAD W/O CM
12 of 14 series · 36 of 48 positions shown · non-contrast
Comparison: Prior CTs from earlier the same day.

CLINICAL DATA: Follow-up examination for acute stroke. Patient with
previously identified left M2 branch occlusion, superior division.
Status post catheter directed intervention achieving [4F]
revascularization. Recanalized A2/A3 filling defect also noted on
arteriogram.

EXAM:
MRI HEAD WITHOUT CONTRAST
MRA HEAD WITHOUT CONTRAST
TECHNIQUE: Multiplanar, multiecho pulse sequences of the brain and surrounding
structures were obtained without intravenous contrast. Angiographic
images of the head were obtained using MRA technique without
contrast.

[Series 5: DWI · axial · 3.0mm · 0.88mm/px · z∈[-124,+13]mm · 5 of 96 slices shown (1 of 4)]
[im 1/96]
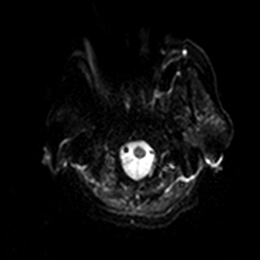
[im 24/96]
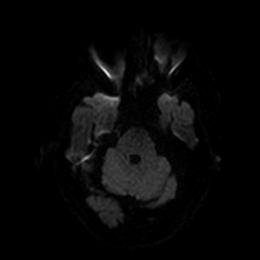
[im 48/96]
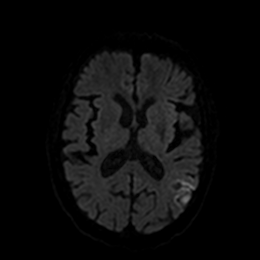
[im 72/96]
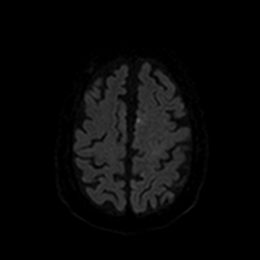
[im 96/96]
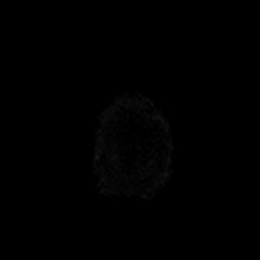

[Series 6: DWI · axial · 3.0mm · 0.88mm/px · z∈[-124,+13]mm · 2 of 48 slices shown (2 of 4)]
[im 1/48]
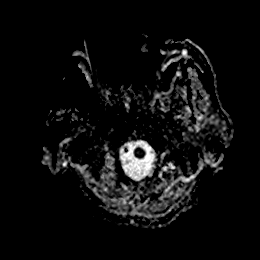
[im 48/48]
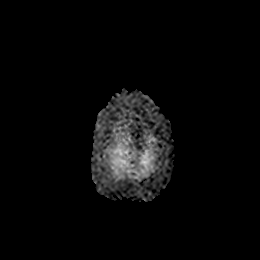

[Series 7: DWI · coronal · 4.0mm · 0.88mm/px · 5 of 74 slices shown (3 of 4)]
[im 1/74]
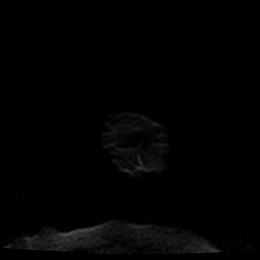
[im 19/74]
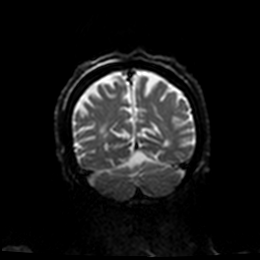
[im 37/74]
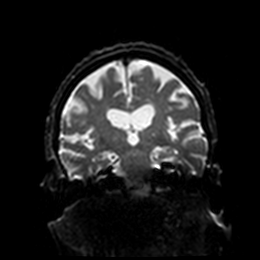
[im 55/74]
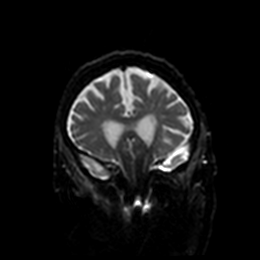
[im 74/74]
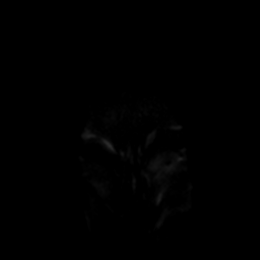

[Series 8: DWI · coronal · 4.0mm · 0.88mm/px · 2 of 36 slices shown (4 of 4)]
[im 1/36]
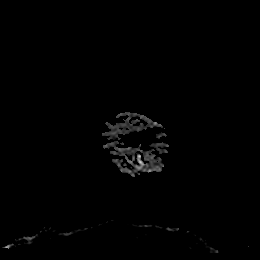
[im 36/36]
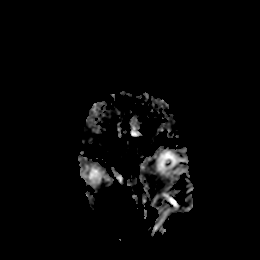

[Series 9: FLAIR · axial · 5.0mm · 0.45mm/px · z∈[-130,+9]mm · 2 of 25 slices shown]
[im 1/25]
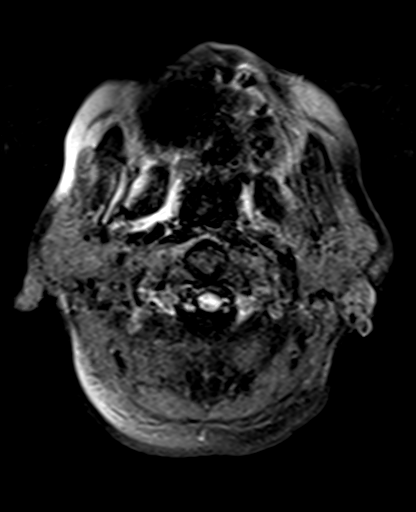
[im 25/25]
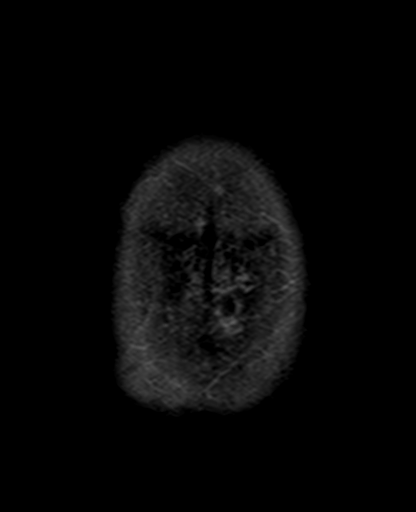

[Series 14: T1 · sagittal · 5.0mm · 0.75mm/px · 1 of 23 slices shown]
[im 1/23]
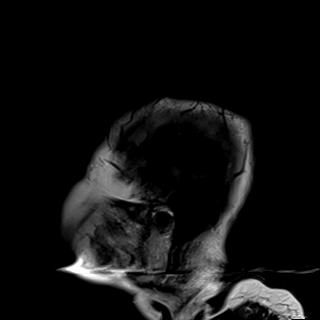

[Series 15: T2 · axial · 5.0mm · 0.72mm/px · z∈[-134,+6]mm · 2 of 25 slices shown (1 of 2)]
[im 1/25]
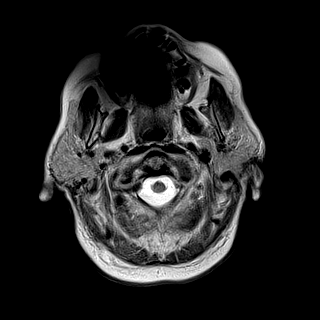
[im 25/25]
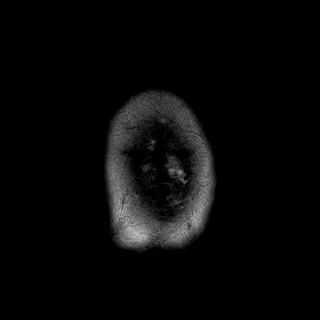

[Series 16: mag_images · axial · 3.0mm · 0.90mm/px · z∈[-146,+25]mm · 4 of 60 slices shown]
[im 1/60]
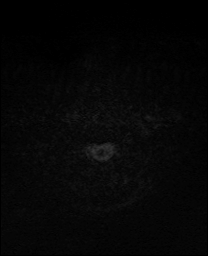
[im 20/60]
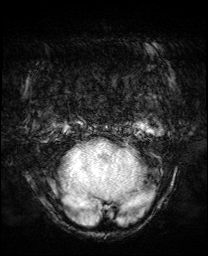
[im 40/60]
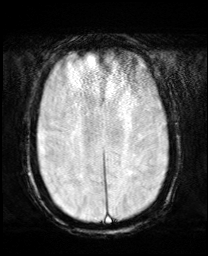
[im 60/60]
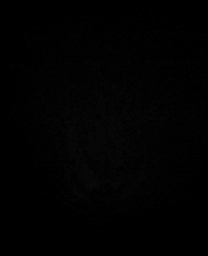

[Series 17: pha_images · axial · 3.0mm · 0.90mm/px · z∈[-146,+25]mm · 4 of 59 slices shown]
[im 1/59]
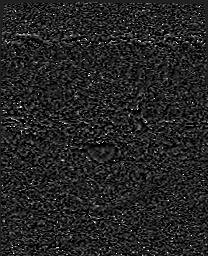
[im 20/59]
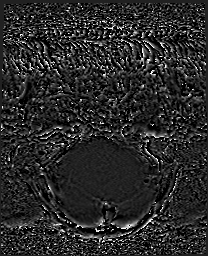
[im 39/59]
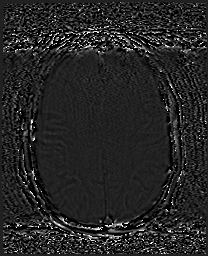
[im 59/59]
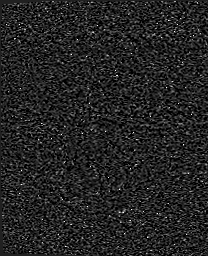

[Series 18: swi_images · axial · 3.0mm · 0.90mm/px · z∈[-146,+25]mm · 4 of 60 slices shown]
[im 1/60]
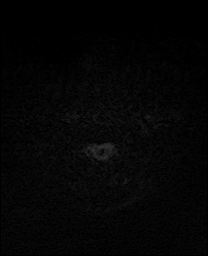
[im 20/60]
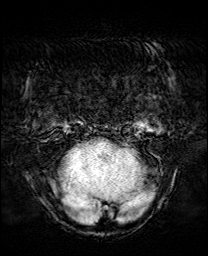
[im 40/60]
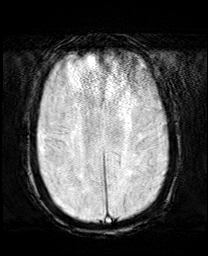
[im 60/60]
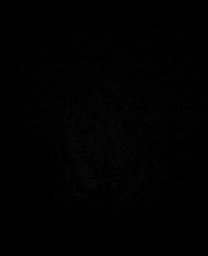

[Series 19: mip_images(sw) · axial · 24.0mm · 0.90mm/px · z∈[-136,+15]mm · 3 of 53 slices shown]
[im 1/53]
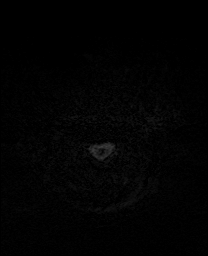
[im 27/53]
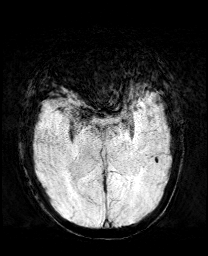
[im 53/53]
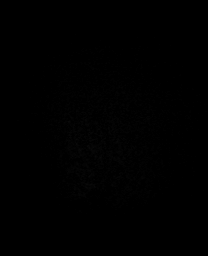

[Series 21: T2 · coronal · 5.0mm · 0.34mm/px · 2 of 30 slices shown (2 of 2)]
[im 1/30]
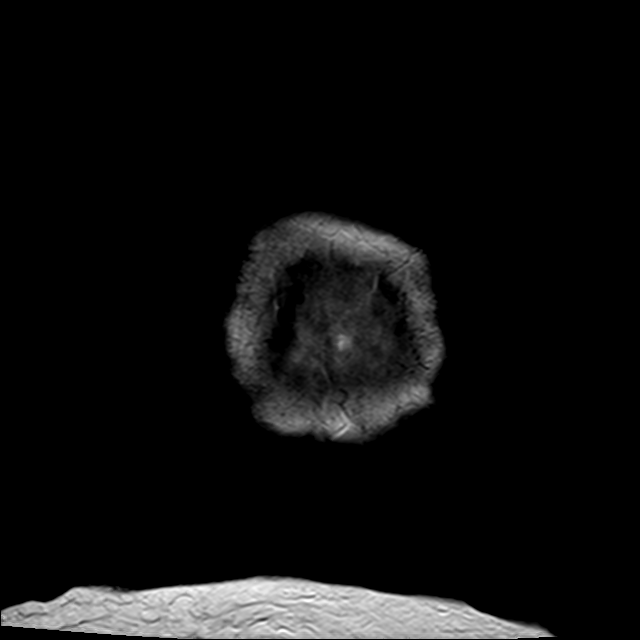
[im 30/30]
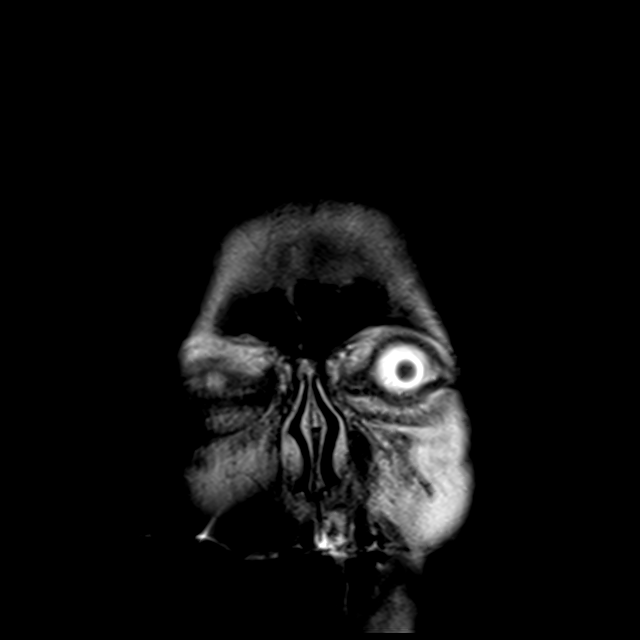

[36 of 48 positions shown; findings below may reference images not displayed]

FINDINGS: MRI HEAD FINDINGS

Brain: Examination mildly degraded by motion artifact.

Generalized age-related cerebral atrophy. Patchy and confluent
T2/FLAIR hyperintensity within the periventricular, deep, and
subcortical white matter both cerebral hemispheres, nonspecific, but
most like related chronic microvascular ischemic disease, moderate
in nature. Superimposed remote lacunar infarct at the left corona
radiata/basal ganglia.

Patchy areas of restricted diffusion seen involving the cortical
gray matter of the left insula, left temporal occipital region, and
mesial left temporal lobe are seen, consistent with small volume
acute ischemic infarcts (series 5, images 70, 69, 67). These are
predominantly left MCA distribution. Additional patchy small volume
cortical infarcts noted at the high left frontoparietal region
(series 5, images 91, 85), left ACA and MCA distribution. No
associated mass effect. Single small focus of associated petechial
hemorrhage present at the left temporal region (series 18, image
27).

No other evidence for acute or subacute ischemia. Gray-white matter
differentiation otherwise maintained. No mass lesion, mass effect,
or midline shift. No hydrocephalus or extra-axial fluid collection.
Pituitary gland and suprasellar region within normal limits.

Vascular: Major intracranial vascular flow voids are maintained.

Skull and upper cervical spine: Craniocervical junction within
normal limits. Bone marrow signal intensity normal. No scalp soft
tissue abnormality.

Sinuses/Orbits: Globes and orbital soft tissues within normal
limits. Paranasal sinuses are clear. No mastoid effusion.

Other: None.

MRA HEAD FINDINGS

ANTERIOR CIRCULATION:

Examination degraded by motion artifact.

Visualized distal cervical segments of the internal carotid arteries
are grossly patent with antegrade flow. Apparent short-segment
defect at the distal cervical left ICA felt to be due to motion
artifact, as no stenosis or other defect seen within this region on
corresponding arteriogram. Petrous, cavernous, and supraclinoid
segments of both internal carotid arteries patent without
appreciable stenosis or other abnormality. A1 segments patent
bilaterally. Grossly normal anterior communicating artery complex.
Partially visualized ACAs perfused to their distal aspects without
appreciable stenosis.

M1 segments patent bilaterally. Left MCA trifurcations. Previously
identified occluded left M2 branch appears grossly patent status
post revascularization. MCA branches otherwise well perfused
bilaterally.

POSTERIOR CIRCULATION:

Right vertebral artery strongly dominant and patent to the
vertebrobasilar junction. Right PICA not definitely seen. Left
vertebral artery hypoplastic and largely terminates in PICA,
although a small branch seen ascending towards the vertebrobasilar
junction. Left PICA patent proximally. Basilar diffusely irregular
but is patent to its distal aspect without high-grade stenosis.
Superior cerebral arteries grossly patent proximally. Both PCAs are
patent proximally, not well assessed distally due to motion.

No intracranial aneurysm.
IMPRESSION: MRI HEAD IMPRESSION:

1. Patchy acute ischemic infarcts involving the MCA and ACA
distributions as above, predominantly cortical in nature. Associated
minimal petechial hemorrhage at the left temporal region without
significant mass effect.
2. Underlying age-related cerebral atrophy with moderate chronic
microvascular ischemic disease.

MRA HEAD IMPRESSION:

1. Technically limited exam due to motion artifact.
2. Previously identified occluded left M2 branch appears grossly
patent status post revascularization.
3. Otherwise grossly stable and negative intracranial MRA. No other
large vessel occlusion. No other hemodynamically significant or
correctable stenosis.

## 2020-05-03 IMAGING — DX DG CHEST 1V PORT
1 series · 1 of 1 positions shown · non-contrast
Comparison: [DATE]

CLINICAL DATA: Hypoxia, recent cerebral revascularization.

EXAM:
PORTABLE CHEST 1 VIEW

[chest ap]
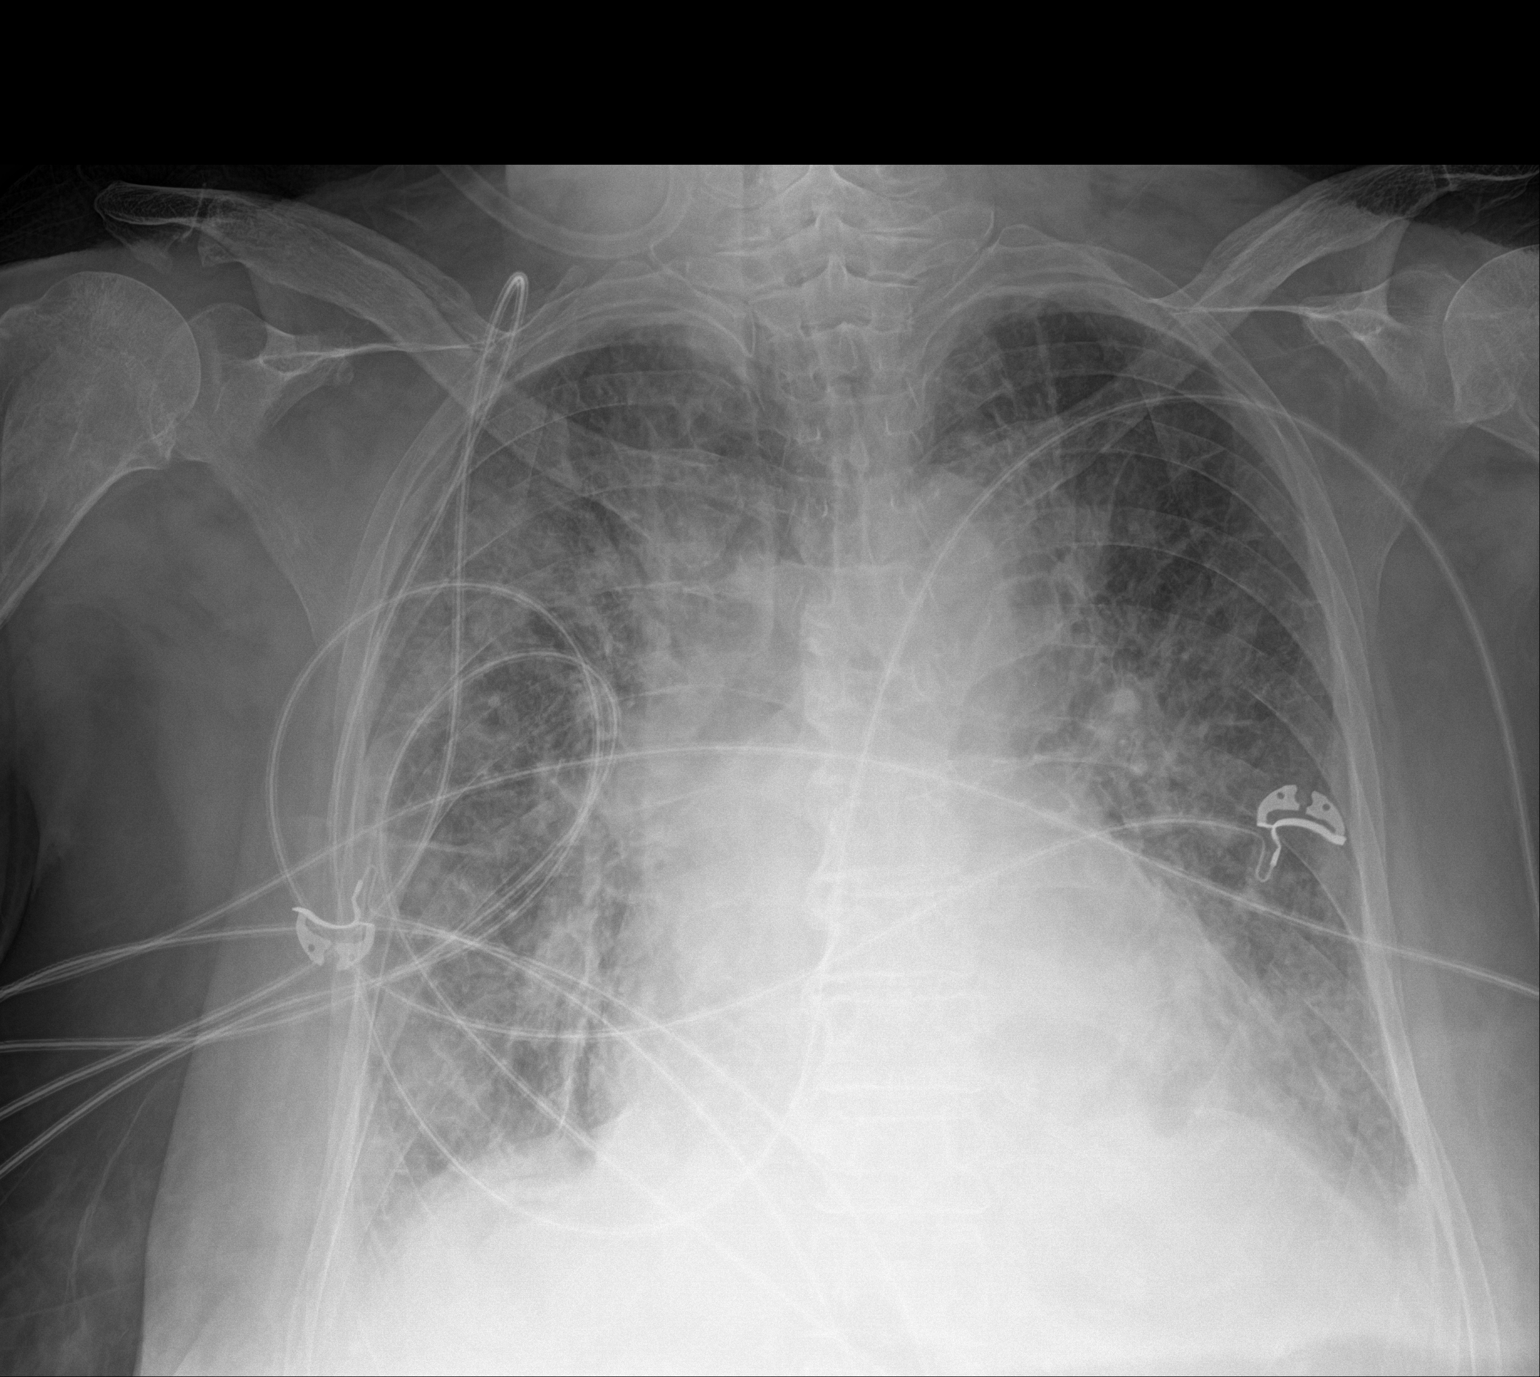

[1 of 1 positions shown; findings below may reference images not displayed]

FINDINGS: Cardiac shadow is enlarged but stable. Diffuse vascular congestion
is noted with some patchy edema particularly in the right lung.
Small effusions are noted bilaterally. No bony abnormality is seen.
IMPRESSION: Vascular congestion with mild parenchymal edema on the right
consistent with congestive failure.

## 2020-05-03 IMAGING — XA IR PERCUTANEOUS ART THORMBECTOMY/INFUSION INTRACRANIAL INCLUDE D
3 series · 10 of 24 positions shown · non-contrast
Comparison: CT angiogram of the head and neck [DATE].

INDICATION: New onset of aphasia, slurred speech, right facial weakness, and
right leg weakness. Occlusion of the inferior division of the left
middle cerebral artery in the M3 region, and the superior division
mid M2 segment.

EXAM:
1. EMERGENT LARGE VESSEL OCCLUSION THROMBOLYSIS (anterior
CIRCULATION)
TECHNIQUE: Following a full explanation of the procedure along with the
potential associated complications, an informed witnessed consent
was obtained. The risks of intracranial hemorrhage of 10%, worsening
neurological deficit, ventilator dependency, death and inability to
revascularize were all reviewed in detail with the patient's .

[Series 18: <mpr range>1 · axial · 5.0mm · 0.40mm/px · 1 of 39 slices shown]
[im 39/39]
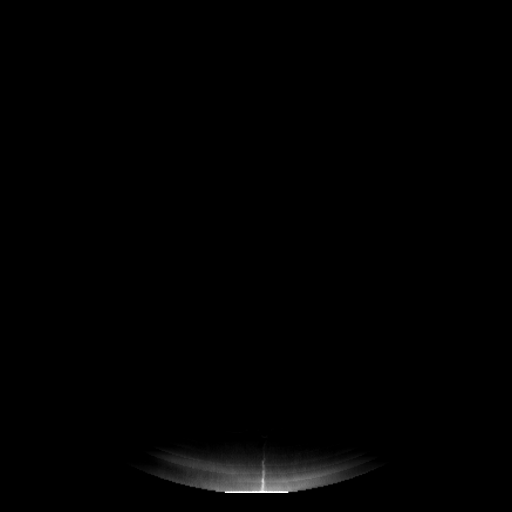

[Series 18: <mpr range>2 · coronal · 5.0mm · 0.47mm/px · 1 of 31 slices shown]
[im 31/31]
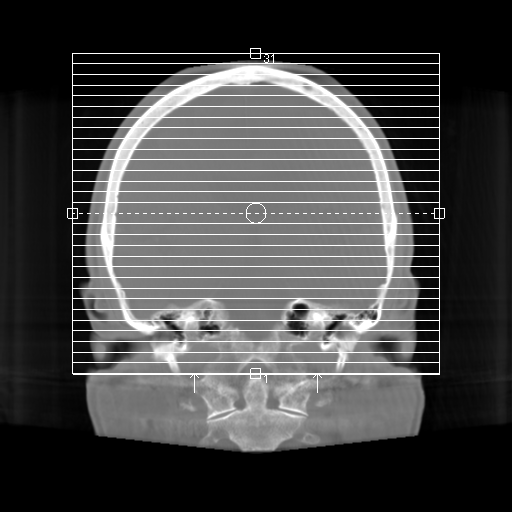

[Series 300: ir (person_name) add (inclu) mod sed · 8 of 305 slices shown]
[im 33/305]
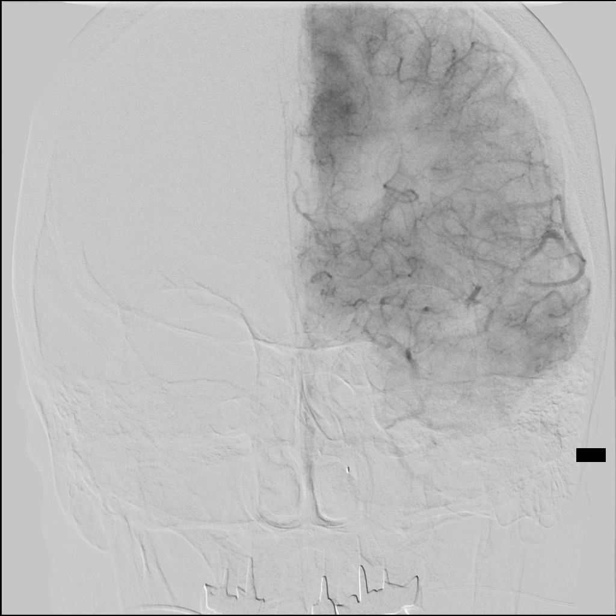
[im 65/305]
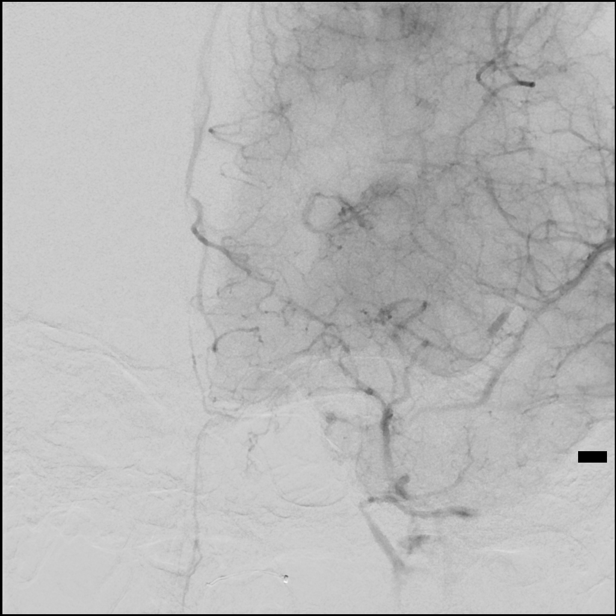
[im 97/305]
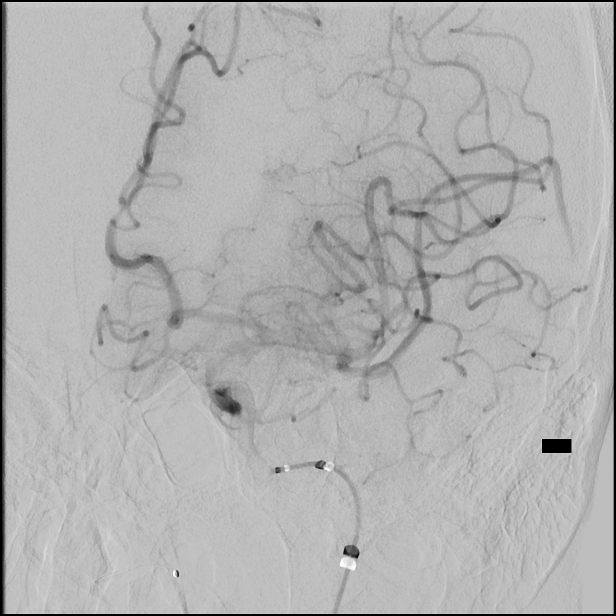
[im 145/305]
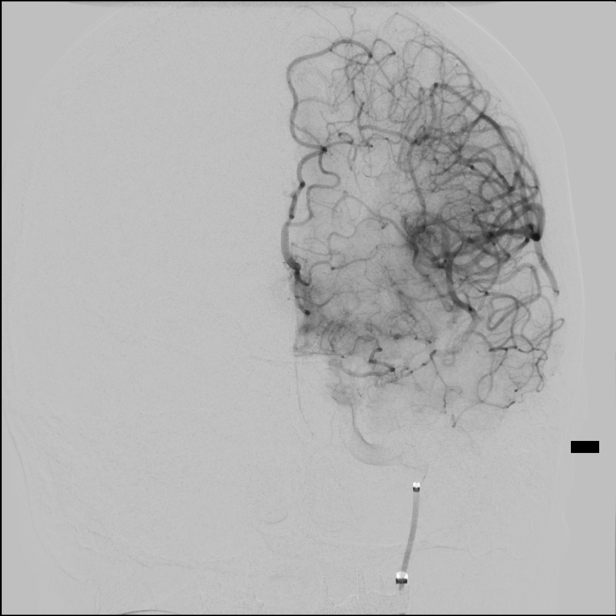
[im 177/305]
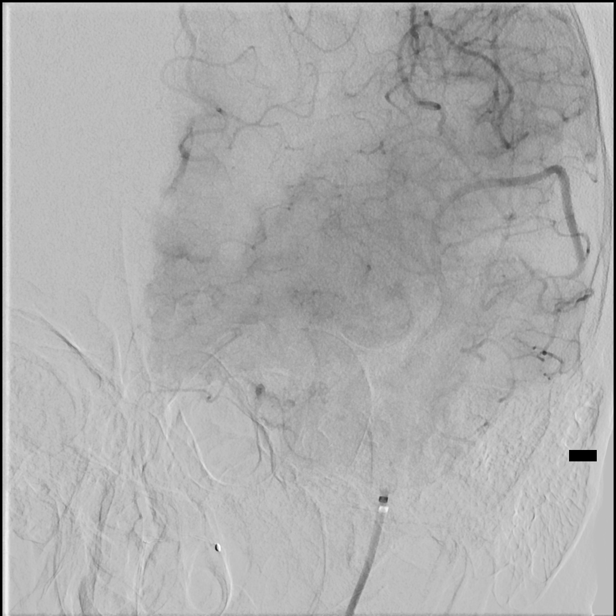
[im 225/305]
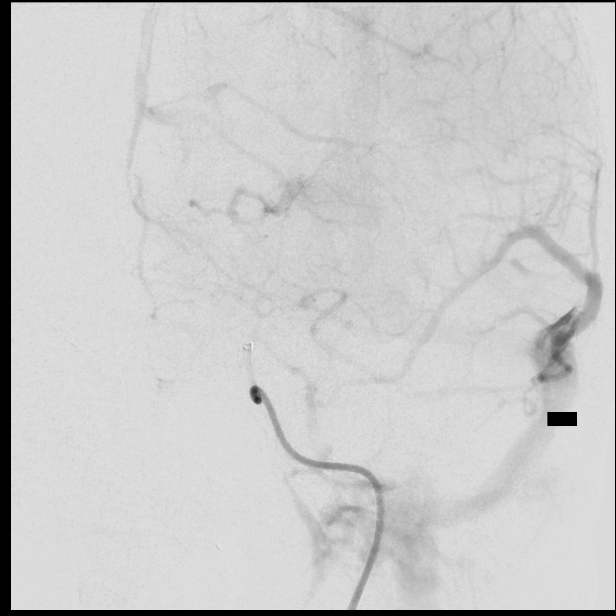
[im 257/305]
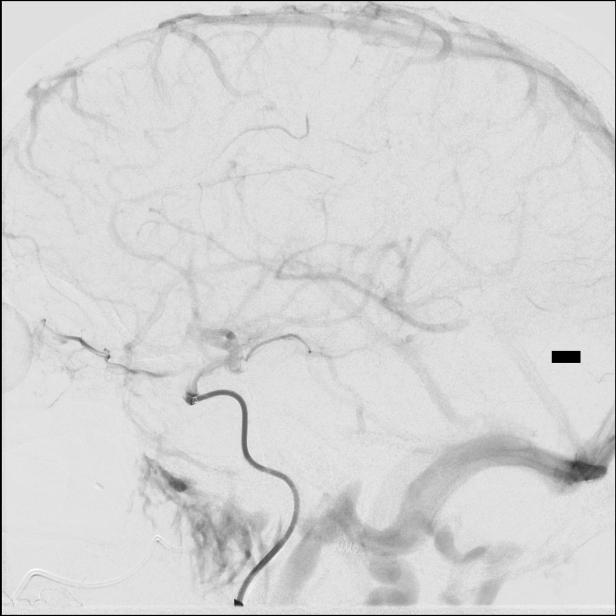
[im 289/305]
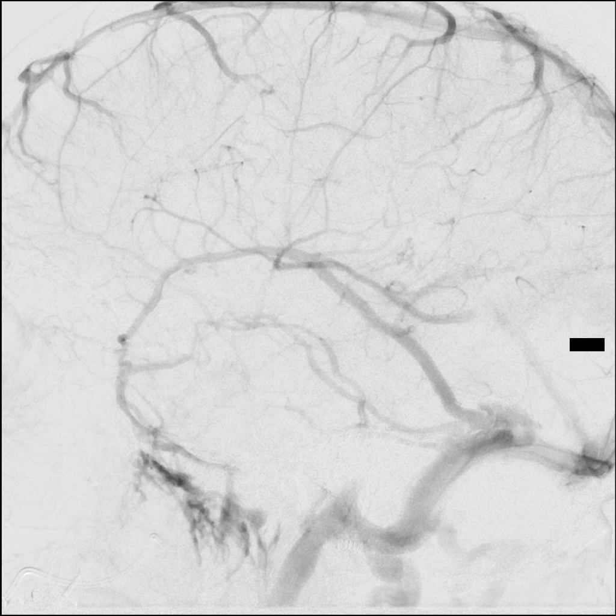

[10 of 24 positions shown; findings below may reference images not displayed]

MEDICATIONS:
Ancef 2 g IV antibiotic was administered within 1 hour of the
procedure.

ANESTHESIA/SEDATION:
General anesthesia

CONTRAST:  Isovue 300 approximately 80 mL

FLUOROSCOPY TIME:  Fluoroscopy Time: 38 minutes 30 seconds ([LT]
mGy).

COMPLICATIONS:
None immediate.
The patient was then put under general anesthesia by the [REDACTED] at [HOSPITAL].

The right groin was prepped and draped in the usual sterile fashion.
Thereafter using modified Seldinger technique, transfemoral access
into the right common femoral artery was obtained without
difficulty. Over a 0.035 inch guidewire an 8 French 25 cm Pinnacle
sheath was inserted. Through this, and also over a 0.035 inch
guidewire a 5 French JB 1 catheter was advanced to the aortic arch
region and selectively positioned in the left common carotid artery.
An arteriogram was then perfor[REDACTED]ed extra cranially and
intracranially.
FINDINGS: The left common carotid arteriogram demonstrates the left external
carotid artery and its major branches to be widely patent.

The left internal carotid artery at the bulb to the cranial skull
base also demonstrates wide patency.

The petrous, the cavernous and the supraclinoid segments are widely
patent.

A small infundibulum is seen at the origin of the left posterior
communicating artery. Also observed is the broad-based saccular
outpouching along the posterior wall of the left internal carotid
artery opposite the origin of the ophthalmic artery.

The left middle cerebral artery demonstrates patency of the left M1
segment.

The superior division demonstrates a M2 branch occlusion.
Additionally noted is occlusion of the left middle cerebral artery
inferior division in the distal M3 region.

The left anterior cerebral artery opacifies into the capillary and
venous phases.

PROCEDURE:
The JB 1 catheter in the left common carotid artery was exchanged
over a 0.035 inch 300 cm Rosen exchange guidewire for a Zoom
TracStar 8 French 95 cm catheter which was positioned in the
proximal [DATE] of the left internal carotid artery.

The guidewire was removed. Good aspiration obtained from the hub of
the TracStar guide catheter. A gentle control arteriogram performed
through this demonstrated no evidence of spasms, dissections or of
intraluminal filling defects. Over a 0.014 inch standard Synchro
micro guidewire with a J configuration, a 132 cm 55 Zoom aspiration
catheter with a 35 136 aspiration catheter combination was advanced
to the supraclinoid left ICA.

The micro guidewire was then gently advanced into the left middle
cerebral artery occluded superior division in the M2 segment. The
guidewire was removed after advancement of the 35 aspiration
catheter in the distal M2 M3 region. The guidewire was removed. Good
aspiration obtained from the hub of the 35 Zoom aspiration catheter.

Thereafter, with aspiration being applied at the hub of the 55 Zoom
aspiration catheter, the TracStar aspiration catheter using a 60 mL
syringe, and a 20 mL syringe at the hub of the 35 Zoom aspiration
catheter for approximately 1-1/2 minutes, the combination of the 35
and 55 were retrieved and removed. Clots were seen within the
aspirate, and also in the 35 Zoom aspiration catheter.

Control arteriogram performed through the TracStar guide catheter in
the distal left internal carotid artery demonstrated no significant
change in the occluded superior division branch M2 segment.

A second pass was then made in a similar fashion with 55 Zoom
aspiration catheter, and the 35 Zoom aspiration catheter advanced
into the distal M2 M3 segment of the superior division. The 55
aspiration catheter was engaged in the proximal occluded vessel.
Aspiration applied for approximately 2 minutes at the 55 Zoom
aspiration catheter with a Penumbra aspiration device, with a 20 mL
syringe at the hub of the 35 Zoom aspiration catheter, and with a 20
mL syringe at the hub of the TracStar catheter. Combination was then
retrieved and removed. Copious amounts of clot were seen within the
canister. Also seen were bits of clot in the 55 aspiration catheter.
A control arteriogram performed through the TracStar catheter in the
left internal carotid artery now demonstrated complete
revascularization of the superior division M2 segment.

The M3 segment of the inferior division remained occluded.

The 55 Zoom aspiration catheter was then advanced with an 021 Trevo
ProVue microcatheter combination over a 0.014 inch micro guidewire
with a J-tip configuration to the left middle cerebral artery
distally.

The micro guidewire was then gently manipulated and advanced through
the inferior division into the M3 region followed by the
microcatheter. The microcatheter could not be advanced on account of
severe tortuosity of the distal M3 occluded segment.

However, the micro guidewire was advanced in a to and fro fashion
through the occlusion multiple times in order to establish some
distal flow.

The micro guidewire was then retrieved and removed. A control
arteriogram performed through the microcatheter in the inferior
division now demonstrated slow advancement of contrast distal to
this.

The microcatheter and micro guidewire were retrieved and removed. A
control arteriogram performed through the 55 aspiration catheter in
the cavernous left ICA demonstrated a TICI 2C revascularization of
the left MCA distribution. The left posterior communicating artery
remained patent. Slow recanalization was evident in the M3 region of
the inferior division.

However, also noted now was a filling defect in the anterior
cerebral artery A2 A3 junction with slow flow distal to this.

Proximal stenosis probably related to intracranial arteriosclerosis
was evident.

It was, therefore, decided not to attempt at aspiration at this site
as slow recanalization was evident.

A final control arteriogram performed through the TracStar guide
sheath in the left common carotid artery demonstrated wide patency
of the left internal carotid artery proximally and distally.

The left middle cerebral artery continued to demonstrate a TICI 2c
revascularization with mild to modestly improved flow through the
distal inferior division M3 segment. Also noted was improved flow
through the left anterior cerebral artery in the A2 A3 junction.

A flat panel CT of the brain demonstrated no evidence of
intracranial hemorrhage, mass effect or midline shift. No gross
hypodensities were evident.

The right groin 8 French sheath was removed with manual compression
applied for 30 minutes. Hemostasis was achieved. Distal pulses
remained Dopplerable and unchanged in both feet.

The patient was extubated without event.

Upon recovery, the patient was able to move her left arm and leg
spontaneously and also lift the right leg spontaneously. There
continued be weakness of the right upper extremity. Also the patient
demonstrated difficulty comprehending simple instructions. She was
then transferred to the neuro ICU for post revascularization
management.
IMPRESSION: Status post endovascular revascularization of M2 superior division
branch of the left middle cerebral artery, and partially of the left
middle cerebral inferior division in the distal M3 region achieving
a TICI 2c revascularization.

Incidental note made of an approximately 3.5 mm wide neck saccular
outpouching from the posterior wall of the left internal carotid
artery at the level origin of the ophthalmic artery.

PLAN:
Follow-up in the clinic approximately 4 to 6 weeks post discharge.

## 2020-05-03 SURGERY — IR WITH ANESTHESIA
Anesthesia: General

## 2020-05-03 MED ORDER — ACETAMINOPHEN 160 MG/5ML PO SOLN: 650.0000 mg | ORAL | Status: DC | PRN

## 2020-05-03 MED ORDER — SODIUM CHLORIDE 0.9 % IV SOLN
250.0000 mL | INTRAVENOUS | Status: DC
Start: 2020-05-03 — End: 2020-05-03

## 2020-05-03 MED ORDER — METOPROLOL TARTRATE 5 MG/5ML IV SOLN
INTRAVENOUS | Status: DC | PRN
  Administered 2020-05-03 (×2): 2.5 mg via INTRAVENOUS

## 2020-05-03 MED ORDER — ACETAMINOPHEN 650 MG RE SUPP: 650.0000 mg | RECTAL | Status: DC | PRN

## 2020-05-03 MED ORDER — SODIUM CHLORIDE 0.9 % IV SOLN: 250.0000 mL | INTRAVENOUS | Status: DC

## 2020-05-03 MED ORDER — PROCHLORPERAZINE EDISYLATE 10 MG/2ML IJ SOLN
10.0000 mg | Freq: Four times a day (QID) | INTRAMUSCULAR | Status: DC | PRN
  Administered 2020-05-03 – 2020-05-06 (×2): 10 mg via INTRAVENOUS
  Filled 2020-05-03 (×2): qty 2

## 2020-05-03 MED ORDER — SUGAMMADEX SODIUM 200 MG/2ML IV SOLN
INTRAVENOUS | Status: DC | PRN
  Administered 2020-05-03: 200 mg via INTRAVENOUS

## 2020-05-03 MED ORDER — EPTIFIBATIDE 20 MG/10ML IV SOLN
INTRAVENOUS | Status: AC
  Filled 2020-05-03: qty 10

## 2020-05-03 MED ORDER — PHENYLEPHRINE HCL-NACL 10-0.9 MG/250ML-% IV SOLN
25.0000 ug/min | INTRAVENOUS | Status: DC
  Administered 2020-05-04: 25 ug/min via INTRAVENOUS
  Administered 2020-05-04: 130 ug/min via INTRAVENOUS
  Administered 2020-05-04 (×2): 140 ug/min via INTRAVENOUS
  Administered 2020-05-04: 105 ug/min via INTRAVENOUS
  Filled 2020-05-03 (×6): qty 250

## 2020-05-03 MED ORDER — ACETAMINOPHEN 325 MG PO TABS
650.0000 mg | ORAL_TABLET | ORAL | Status: DC | PRN
  Administered 2020-05-08 – 2020-05-09 (×2): 650 mg via ORAL
  Filled 2020-05-03 (×2): qty 2

## 2020-05-03 MED ORDER — DEXAMETHASONE SODIUM PHOSPHATE 10 MG/ML IJ SOLN
INTRAMUSCULAR | Status: DC | PRN
  Administered 2020-05-03: 4 mg via INTRAVENOUS

## 2020-05-03 MED ORDER — PROPOFOL 10 MG/ML IV BOLUS
INTRAVENOUS | Status: DC | PRN
  Administered 2020-05-03: 80 mg via INTRAVENOUS
  Administered 2020-05-03: 20 mg via INTRAVENOUS

## 2020-05-03 MED ORDER — CEFAZOLIN SODIUM-DEXTROSE 2-4 GM/100ML-% IV SOLN
INTRAVENOUS | Status: AC
  Filled 2020-05-03: qty 100

## 2020-05-03 MED ORDER — SODIUM CHLORIDE 0.9 % IV SOLN: INTRAVENOUS | Status: DC

## 2020-05-03 MED ORDER — METOPROLOL TARTRATE 5 MG/5ML IV SOLN
INTRAVENOUS | Status: AC
  Administered 2020-05-03: 5 mg via INTRAVENOUS
  Filled 2020-05-03: qty 5

## 2020-05-03 MED ORDER — SUCCINYLCHOLINE CHLORIDE 20 MG/ML IJ SOLN
INTRAMUSCULAR | Status: DC | PRN
  Administered 2020-05-03: 100 mg via INTRAVENOUS

## 2020-05-03 MED ORDER — CANGRELOR TETRASODIUM 50 MG IV SOLR
INTRAVENOUS | Status: AC
  Filled 2020-05-03: qty 50

## 2020-05-03 MED ORDER — IOHEXOL 350 MG/ML SOLN
40.0000 mL | Freq: Once | INTRAVENOUS | Status: AC | PRN
  Administered 2020-05-03: 40 mL via INTRAVENOUS

## 2020-05-03 MED ORDER — METOPROLOL TARTRATE 5 MG/5ML IV SOLN
5.0000 mg | INTRAVENOUS | Status: DC | PRN
  Administered 2020-05-04 – 2020-05-05 (×2): 5 mg via INTRAVENOUS
  Filled 2020-05-03 (×3): qty 5

## 2020-05-03 MED ORDER — ASPIRIN 81 MG PO CHEW
CHEWABLE_TABLET | ORAL | Status: AC
  Filled 2020-05-03: qty 1

## 2020-05-03 MED ORDER — VERAPAMIL HCL 2.5 MG/ML IV SOLN
INTRAVENOUS | Status: AC
  Filled 2020-05-03: qty 2

## 2020-05-03 MED ORDER — INSULIN ASPART 100 UNIT/ML ~~LOC~~ SOLN: 0.0000 [IU] | SUBCUTANEOUS | Status: DC

## 2020-05-03 MED ORDER — TIROFIBAN HCL IN NACL 5-0.9 MG/100ML-% IV SOLN
INTRAVENOUS | Status: AC
  Filled 2020-05-03: qty 100

## 2020-05-03 MED ORDER — CEFAZOLIN SODIUM-DEXTROSE 2-3 GM-%(50ML) IV SOLR
INTRAVENOUS | Status: DC | PRN
  Administered 2020-05-03: 2 g via INTRAVENOUS

## 2020-05-03 MED ORDER — SENNOSIDES-DOCUSATE SODIUM 8.6-50 MG PO TABS: 1.0000 | ORAL_TABLET | Freq: Every evening | ORAL | Status: DC | PRN

## 2020-05-03 MED ORDER — METOPROLOL TARTRATE 25 MG PO TABS
12.5000 mg | ORAL_TABLET | Freq: Three times a day (TID) | ORAL | Status: DC
  Filled 2020-05-03: qty 1

## 2020-05-03 MED ORDER — CLONIDINE HCL 0.1 MG PO TABS
0.1000 mg | ORAL_TABLET | Freq: Once | ORAL | Status: AC
  Administered 2020-05-03: 0.1 mg via ORAL

## 2020-05-03 MED ORDER — HEPARIN SODIUM (PORCINE) 5000 UNIT/ML IJ SOLN
5000.0000 [IU] | Freq: Three times a day (TID) | INTRAMUSCULAR | Status: DC
  Administered 2020-05-03 – 2020-05-08 (×15): 5000 [IU] via SUBCUTANEOUS
  Filled 2020-05-03 (×14): qty 1

## 2020-05-03 MED ORDER — CLOPIDOGREL BISULFATE 300 MG PO TABS
ORAL_TABLET | ORAL | Status: AC
  Filled 2020-05-03: qty 1

## 2020-05-03 MED ORDER — LIDOCAINE 2% (20 MG/ML) 5 ML SYRINGE
INTRAMUSCULAR | Status: DC | PRN
  Administered 2020-05-03: 40 mg via INTRAVENOUS

## 2020-05-03 MED ORDER — ONDANSETRON HCL 4 MG/2ML IJ SOLN
INTRAMUSCULAR | Status: DC | PRN
  Administered 2020-05-03: 4 mg via INTRAVENOUS

## 2020-05-03 MED ORDER — ESMOLOL HCL 100 MG/10ML IV SOLN
INTRAVENOUS | Status: DC | PRN
  Administered 2020-05-03 (×2): 20 mg via INTRAVENOUS
  Administered 2020-05-03: 30 mg via INTRAVENOUS
  Administered 2020-05-03: 10 mg via INTRAVENOUS

## 2020-05-03 MED ORDER — PHENYLEPHRINE 40 MCG/ML (10ML) SYRINGE FOR IV PUSH (FOR BLOOD PRESSURE SUPPORT)
PREFILLED_SYRINGE | INTRAVENOUS | Status: DC | PRN
  Administered 2020-05-03: 120 ug via INTRAVENOUS
  Administered 2020-05-03: 80 ug via INTRAVENOUS

## 2020-05-03 MED ORDER — CLEVIDIPINE BUTYRATE 0.5 MG/ML IV EMUL
0.0000 mg/h | INTRAVENOUS | Status: AC
  Administered 2020-05-03: 2 mg/h via INTRAVENOUS

## 2020-05-03 MED ORDER — FENTANYL CITRATE (PF) 250 MCG/5ML IJ SOLN
INTRAMUSCULAR | Status: DC | PRN
  Administered 2020-05-03: 50 ug via INTRAVENOUS

## 2020-05-03 MED ORDER — PHENYLEPHRINE HCL-NACL 10-0.9 MG/250ML-% IV SOLN
INTRAVENOUS | Status: DC | PRN
  Administered 2020-05-03: 50 ug/min via INTRAVENOUS

## 2020-05-03 MED ORDER — TICAGRELOR 90 MG PO TABS
ORAL_TABLET | ORAL | Status: AC
  Filled 2020-05-03: qty 2

## 2020-05-03 MED ORDER — IOHEXOL 300 MG/ML  SOLN
150.0000 mL | Freq: Once | INTRAMUSCULAR | Status: AC | PRN
  Administered 2020-05-03: 86 mL via INTRA_ARTERIAL

## 2020-05-03 MED ORDER — STROKE: EARLY STAGES OF RECOVERY BOOK
Freq: Once | Status: DC
  Filled 2020-05-03: qty 1

## 2020-05-03 MED ORDER — ROCURONIUM BROMIDE 10 MG/ML (PF) SYRINGE
PREFILLED_SYRINGE | INTRAVENOUS | Status: DC | PRN
  Administered 2020-05-03: 50 mg via INTRAVENOUS
  Administered 2020-05-03: 20 mg via INTRAVENOUS

## 2020-05-03 MED ORDER — SODIUM CHLORIDE (PF) 0.9 % IJ SOLN
INTRAVENOUS | Status: AC | PRN
  Administered 2020-05-03 (×2): 25 ug via INTRA_ARTERIAL

## 2020-05-03 MED ORDER — INSULIN ASPART 100 UNIT/ML ~~LOC~~ SOLN
0.0000 [IU] | SUBCUTANEOUS | Status: DC
  Administered 2020-05-03: 2 [IU] via SUBCUTANEOUS
  Administered 2020-05-04 – 2020-05-05 (×6): 1 [IU] via SUBCUTANEOUS
  Administered 2020-05-05: 2 [IU] via SUBCUTANEOUS
  Administered 2020-05-06: 1 [IU] via SUBCUTANEOUS
  Administered 2020-05-06: 0 [IU] via SUBCUTANEOUS
  Administered 2020-05-06: 1 [IU] via SUBCUTANEOUS

## 2020-05-03 MED ORDER — SODIUM CHLORIDE 0.9 % IV SOLN: INTRAVENOUS | Status: DC | PRN

## 2020-05-03 MED ORDER — LORAZEPAM 2 MG/ML IJ SOLN
2.0000 mg | Freq: Once | INTRAMUSCULAR | Status: AC
  Administered 2020-05-03: 2 mg via INTRAVENOUS
  Filled 2020-05-03: qty 1

## 2020-05-03 MED ORDER — ACETAMINOPHEN 325 MG PO TABS: 650.0000 mg | ORAL_TABLET | ORAL | Status: DC | PRN

## 2020-05-03 MED ORDER — IOHEXOL 350 MG/ML SOLN
75.0000 mL | Freq: Once | INTRAVENOUS | Status: AC | PRN
  Administered 2020-05-03: 75 mL via INTRAVENOUS

## 2020-05-03 NOTE — Consult Note (Signed)
[facility] Telemedicine Consult   Requesting Provider:  Dr Sedonia Small Consult Participants: Dr. Jerelyn Charles, [place]   Bedside RN Jinny Blossom Location of the provider: [facility] Location of the patient: [facility] This consult was provided via telemedicine with 2-way video and audio communication. The patient/family was informed that care would be provided in this way and agreed to receive care in this manner.   Chief Complaint: confusion and right facial droop  HPI: Patient is a 79 year old woman, past medical history of atrial fibrillation on Xarelto last dose this morning, diabetes, hyperlipidemia, hypertension, hypothyroidism, small vessel etiology left thalamic stroke documented on the chart without any residual deficits, last known normal according to this EMS report-at 10 AM when in after she had an acute onset of confusion and some right facial weakness. This morning she was in her normal state of health but then started having trouble with palpitations.  She was taken to urgent care where her heart rate was in the 190s, blood pressure in the 180s, given clonidine followed by labetalol due to her being in rapid A. fib and shortly after was noted that she was not acting right.  EMS was called.  On their evaluation she had a right facial droop and was appearing confused and not making sense in all her sentences. Brought in to [facility] where a code stroke was activated. I saw and evaluated her on the camera-initial NIH 6 for mild right leg drift, right facial droop, not being able to answer the orientation questions as well as aphasia-able to name simple objects but not able to say full sentences and speech became garbled on long sentences along with mild dysarthria.  Son had only seen her 3 days, but spoken over the phone a day or so ago and could not tell me when the last known normal today was.  He gave me a number for a friend/neighbor [phone] which was  unreachable after multiple tries.  She was the person who had witnessed this morning's events and called EMS.  EMS reported last known well was 10 AM.  Past Medical History:  Diagnosis Date  . Arthritis   . Atrial fibrillation ([place])   . Complication of anesthesia   . Diabetes mellitus without complication ([place])   . Diverticulosis   . Fatigue   . Gastric polyp   . Goiter   . Hyperlipidemia   . Hypertension   . Hypothyroid    taken off of thyroid medication 2012014   . Obese   . Osteopenia   . PONV (postoperative nausea and vomiting)   . Postmenopausal   . Rosacea   . Stroke ([place]) 01/15/2013   left thalamic  stroke, small vessel  . Vitamin D deficiency     No current facility-administered medications for this encounter.  Current Outpatient Medications:  .  alendronate (FOSAMAX) 70 MG tablet, TAKE 1 TABLET BY MOUTH  WEEKLY AS DIRECTED (Patient taking differently: Take 70 mg by mouth once a week. ), Disp: 12 tablet, Rfl: 3 .  Cyanocobalamin (VITAMIN B12) 1000 MCG TBCR, Take 1,000 mcg by mouth. , Disp: , Rfl:  .  diltiazem (CARDIZEM CD) 240 MG 24 hr capsule, TAKE 1 CAPSULE BY MOUTH  DAILY (Patient taking differently: Take 240 mg by mouth daily. ), Disp: 90 capsule, Rfl: 3 .  glucose blood (ONE TOUCH ULTRA TEST) test strip, USE TO CHECK BLOOD GLUCOSE  ONCE A DAY AS INSTRUCTED, Disp: 100 each, Rfl: 6 .  lisinopril (ZESTRIL) 40  MG tablet, TAKE 1 TABLET BY MOUTH  DAILY (Patient taking differently: Take 40 mg by mouth daily. ), Disp: 90 tablet, Rfl: 3 .  metFORMIN (GLUCOPHAGE) 1000 MG tablet, Take 0.5 tablets (500 mg total) by mouth daily with breakfast., Disp: 90 tablet, Rfl: 0 .  metoprolol succinate (TOPROL-XL) 50 MG 24 hr tablet, TAKE 1 TABLET BY MOUTH  DAILY WITH OR IMMEDIATELY  FOLLOWING A MEAL (Patient taking differently: Take 50 mg by mouth daily. ), Disp: 90 tablet, Rfl: 0 .  ONETOUCH DELICA LANCETS 62I MISC, 1 each by Does not apply route daily. Use to check BG daily, Disp: 100  each, Rfl: 2 .  XARELTO 20 MG TABS tablet, TAKE 1 TABLET BY MOUTH  DAILY (Patient taking differently: Take 20 mg by mouth daily with supper. ), Disp: 90 tablet, Rfl: 0    LKW: 10 AM today 05/03/2020 tpa given?: No, on Xarelto-last dose early this morning IR Thrombectomy? YES Modified Rankin Scale: 0-Completely asymptomatic and back to baseline post- stroke Time of teleneurologist evaluation: 12:56 PM  Exam: Vitals:   05/03/20 1320 05/03/20 1326  BP: (!) 154/109 140/88  Pulse: (!) 131 (!) 124  Resp: (!) 21 20  Temp:  98.4 F (36.9 C)  SpO2: 90% 93%    General: Awake alert in no distress. Neurological exam: Awake alert oriented to self only, unable to tell me her correct age or month or date, speech mildly dysarthric, able to name few simple objects but not able to say long sentences without garbling her words at the end, able to follow some commands but perseverates, pupils equal round react light, extraocular movements intact, visual field full, right lower facial weakness appreciated at rest, motor examination consistent with right leg vertical drift otherwise unremarkable, no sensory deficits, no coordination deficits, gait was not tested.   NIHSS 1A: Level of Consciousness - 0 1B: Ask Month and Age - 2 1C: 'Blink Eyes' & 'Squeeze Hands' - 0 2: Test Horizontal Extraocular Movements - 0 3: Test Visual Fields - 0 4: Test Facial Palsy - 1 5A: Test Left Arm Motor Drift - 0 5B: Test Right Arm Motor Drift - 0 6A: Test Left Leg Motor Drift - 0 6B: Test Right Leg Motor Drift - 1 7: Test Limb Ataxia - 0 8: Test Sensation - 0 9: Test Language/Aphasia- 1 10: Test Dysarthria - 1 11: Test Extinction/Inattention - 0 NIHSS score: 6   Imaging Reviewed: CT head with no acute changes CTA head and neck with a left M2 occlusion 13 mm from the trifurcation.   Labs reviewed in epic and pertinent values follow: CBC    Component Value Date/Time   WBC 13.4 (H) 05/03/2020 1254   RBC 4.83  05/03/2020 1254   HGB 11.2 (L) 05/03/2020 1320   HGB 13.1 09/16/2019 1136   HCT 33.0 (L) 05/03/2020 1320   HCT 41.4 09/16/2019 1136   PLT 338 05/03/2020 1254   PLT 333 09/16/2019 1136   MCV 77.8 (L) 05/03/2020 1254   MCV 80 09/16/2019 1136   MCH 23.8 (L) 05/03/2020 1254   MCHC 30.6 05/03/2020 1254   RDW 16.9 (H) 05/03/2020 1254   RDW 13.9 09/16/2019 1136   LYMPHSABS 1.3 05/03/2020 1254   LYMPHSABS 2.0 09/16/2019 1136   MONOABS 0.9 05/03/2020 1254   EOSABS 0.2 05/03/2020 1254   EOSABS 0.6 (H) 09/16/2019 1136   BASOSABS 0.1 05/03/2020 1254   BASOSABS 0.1 09/16/2019 1136   CMP     Component Value Date/Time  NA 136 05/03/2020 1320   NA 141 12/28/2019 1136   K 3.8 05/03/2020 1320   CL 101 05/03/2020 1320   CO2 21 (L) 02/07/2020 1119   GLUCOSE 127 (H) 05/03/2020 1320   BUN 9 05/03/2020 1320   BUN 12 12/28/2019 1136   CREATININE 0.60 05/03/2020 1320   CREATININE 0.75 01/06/2013 1354   CALCIUM 9.2 02/07/2020 1119   PROT 6.5 02/07/2020 1119   PROT 6.3 09/16/2019 1136   ALBUMIN 3.5 02/07/2020 1119   ALBUMIN 4.0 09/16/2019 1136   AST 20 02/07/2020 1119   ALT 18 02/07/2020 1119   ALKPHOS 58 02/07/2020 1119   BILITOT 0.3 02/07/2020 1119   BILITOT 0.2 09/16/2019 1136   GFRNONAA >60 02/07/2020 1119   GFRNONAA 80 01/06/2013 1354   GFRAA >60 02/07/2020 1119   GFRAA >89 01/06/2013 1354     Assessment: 79 year old with history of atrial fibrillation amongst others presenting with sudden onset of confusion and right facial droop-symptoms consistent with a left hemispheric stroke. Not a candidate for TPA due to being on Xarelto last dose this morning CTA head and neck consistent with a left M2 occlusion 13 mm from the trifurcation. Case discussed with neuro interventional list at [facility] as well as the neuro hospitalist at [facility] over a three-way call. Some concern regarding the caliber of the affected vessel being too small. Interventionalists requested CT perfusion-if the  penumbra is large enough, then it is worth doing the intervention but if the penumbra is small or if the current penumbra match, the risk will be too high and intervention would not be indicated at the time. CT perfusion ordered. CT perfusion reviewed-0 core, 67 cc penumbra. Rediscussed with interventionalists.  Consensus to take for intervention. Called son over a four-way call-no interventionalist, telemetry neurologist, [facility]ac[facility] Pontes neurologist and son via [place]. Son-Mark [phone]-phone consent-consented for the procedure. Informed EDP of the decision and transfer process to [facility] via EMS which would be the fastest transport initiated by Dr. Babette Relic in the ER.  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 fib with RVR -Coagulopathy secondary to anticoagulant use-on home Xarelto   Recommendations:  -No TPA due to last dose of Xarelto this morning but stat transfer for endovascular thrombectomy. -Patient been accepted to the stroke neurology service at [facility] under Dr. Curly Shores.  Stat transferred via EMS-or [facility]-which ever is faster. -Post intervention care per [facility] neurology team and IR. Management of A. fib with RVR per stroke team/CCM  Spoke with the son on multiple occasions to update him, get history as well as get consent for the procedure.  Discussed with ED provider Dr. Sedonia Small, neuro interventional radiologist Dr. Esperanza Sheets neuro hospitalist Dr. Curly Shores.  This patient is receiving care for possible acute neurological changes. There was 70 minutes of care by this provider at the time of service, including time for direct evaluation via telemedicine, review of medical records, imaging studies and discussion of findings with providers, the patient and/or family.  Process delays other than usual delays expected with telemedicine care: Multiple attempts to confirm last known well-which was witnessed by a friend-whose number was  unreachable.  CTA confirmed vascular occlusion but with a NIH stroke scale at the borderline for intervention and the vessel size also at the lower end of acceptable, CT perfusion was obtained-patient had to be sent to CT scan second time for the perfusion.   -- Amie Portland, MD [facility] Pager: [phone] If 7pm to 7am, please  call on call as listed on AMION.  CRITICAL CARE ATTESTATION Performed by: Amie Portland, MD Total critical care time: 70 minutes Critical care time was exclusive of separately billable procedures and treating other patients and/or supervising APPs/Residents/Students Critical care was necessary to treat or prevent imminent or life-threatening deterioration due to ischemic stroke, decision for endovascular thrombectomy This patient is critically ill and at significant risk for neurological worsening and/or death and care requires constant monitoring. Critical care was time spent personally by me on the following activities: development of treatment plan with patient and/or surrogate as well as nursing, discussions with consultants, evaluation of patient's response to treatment, examination of patient, obtaining history from patient or surrogate, ordering and performing treatments and interventions, ordering and review of laboratory studies, ordering and review of radiographic studies, pulse oximetry, re-evaluation of patient's condition, participation in multidisciplinary rounds and medical decision making of high complexity in the care of this patient.

## 2020-05-03 NOTE — ED Provider Notes (Addendum)
[facility] Emergency Department Provider Note MRN:  [id number]  Arrival date & time: 05/03/20     Chief Complaint   Code Stroke   History of Present Illness   Sheila Ray is a 79 y.o. year-old female with a history of diabetes, A. fib presenting to the ED with chief complaint of code stroke.  Reportedly patient's last known normal is 10 AM, was found to have slurred speech or speech disturbance at doctors office today, was also found to have A. fib with RVR and elevated heart rate.  Concern for right-sided facial droop by EMS, continued confusion, and so code stroke initiated prior to arrival.  Patient denies pain, she exhibits confusion and/or aphasia.  I was unable to obtain an accurate HPI, PMH, or ROS due to the patient's confusion/aphasia.  Level 5 caveat.  Review of Systems  Positive for confusion, aphasia, facial droop.  Patient's Health History    Past Medical History:  Diagnosis Date   Arthritis    Atrial fibrillation ([place])    Complication of anesthesia    Diabetes mellitus without complication ([place])    Diverticulosis    Fatigue    Gastric polyp    Goiter    Hyperlipidemia    Hypertension    Hypothyroid    taken off of thyroid medication [id number]    Obese    Osteopenia    PONV (postoperative nausea and vomiting)    Postmenopausal    Rosacea    Stroke ([place]) 01/15/2013   left thalamic  stroke, small vessel   Vitamin D deficiency     Past Surgical History:  Procedure Laterality Date   ABDOMINAL HYSTERECTOMY  1985   FOOT SURGERY Right 08/2002   right knee arthroscopy   2013    TONSILLECTOMY     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   Procedure: RIGHT TOTAL KNEE ARTHROPLASTY;  Surgeon: Gearlean Alf, MD;  Location: [facility];  Service: Orthopedics;  Laterality: Right;    Family History  Problem Relation Age of Onset   Osteoporosis Mother    Alzheimer's disease Mother 41   Arthritis Mother    Cancer  Father        Lung   Arthritis Sister    Obesity Sister    Heart attack Brother 76   Heart disease Son    Arthritis Brother    Arthritis Sister    Cancer Sister        breast cancer    Social History   Socioeconomic History   Marital status: Divorced    Spouse name: Not on file   Number of children: 1   Years of education: 12   Highest education level: 12th grade  Occupational History   Occupation: retired  Tobacco Use   Smoking status: Never Smoker   Smokeless tobacco: Never Used  Scientific laboratory technician Use: Never used  Substance and Sexual Activity   Alcohol use: No   Drug use: No   Sexual activity: Not Currently  Other Topics Concern   Not on file  Social History Narrative   Not on file   Social Determinants of Health   Financial Resource Strain: Low Risk    Difficulty of Paying Living Expenses: Not hard at all  Food Insecurity: No Food Insecurity   Worried About Charity fundraiser in the Last Year: Never true   [place] in the Last Year: Never true  Transportation Needs: No Transportation Needs  Lack of Transportation (Medical): No   Lack of Transportation (Non-Medical): No  Physical Activity: Insufficiently Active   Days of Exercise per Week: 2 days   Minutes of Exercise per Session: 20 min  Stress: No Stress Concern Present   Feeling of Stress : Not at all  Social Connections: Moderately Integrated   Frequency of Communication with Friends and Family: More than three times a week   Frequency of Social Gatherings with Friends and Family: More than three times a week   Attends Religious Services: More than 4 times per year   Active Member of [employer] or Organizations: Yes   Attends Music therapist: More than 4 times per year   Marital Status: Divorced  Human resources officer Violence: Not At Risk   Fear of Current or Ex-Partner: No   Emotionally Abused: No   Physically Abused: No   Sexually Abused: No      Physical Exam   Vitals:   05/03/20 1320 05/03/20 1326  BP: (!) 154/109 140/88  Pulse: (!) 131 (!) 124  Resp: (!) 21 20  Temp:  98.4 F (36.9 C)  SpO2: 90% 93%    CONSTITUTIONAL: Well-appearing, NAD NEURO: Awake, alert, oriented to name, subtle right facial droop, otherwise normal and symmetric strength and sensation to the arms and legs, mild to moderate expressive aphasia EYES:  eyes equal and reactive ENT/NECK:  no LAD, no JVD CARDIO: Regular rate, well-perfused, normal S1 and S2 PULM:  CTAB no wheezing or rhonchi GI/GU:  normal bowel sounds, non-distended, non-tender MSK/SPINE:  No gross deformities, no edema SKIN:  no rash, atraumatic PSYCH:  Appropriate speech and behavior  *Additional and/or pertinent findings included in MDM below  Diagnostic and Interventional Summary    EKG Interpretation  Date/Time:  Wednesday May 03 2020 13:17:55 EDT Ventricular Rate:  131 PR Interval:    QRS Duration: 141 QT Interval:  316 QTC Calculation: 465 R Axis:   3 Text Interpretation: Atrial fibrillation Ventricular premature complex Nonspecific intraventricular conduction delay Abnormal lateral Q waves Anterior infarct, old No significant change was found Confirmed by Gerlene Fee [phone]) on 05/03/2020 1:32:33 PM      Labs Reviewed  CBC - Abnormal; Notable for the following components:      Result Value   WBC 13.4 (*)    Hemoglobin 11.5 (*)    MCV 77.8 (*)    MCH 23.8 (*)    RDW 16.9 (*)    All other components within normal limits  DIFFERENTIAL - Abnormal; Notable for the following components:   Neutro Abs 10.9 (*)    All other components within normal limits  I-STAT CHEM 8, ED - Abnormal; Notable for the following components:   Sodium 134 (*)    Glucose, Bld 132 (*)    Calcium, Ion 0.88 (*)    TCO2 18 (*)    All other components within normal limits  CBG MONITORING, ED - Abnormal; Notable for the following components:   Glucose-Capillary 127 (*)    All other  components within normal limits  CBG MONITORING, ED - Abnormal; Notable for the following components:   Glucose-Capillary 131 (*)    All other components within normal limits  I-STAT CHEM 8, ED - Abnormal; Notable for the following components:   Glucose, Bld 127 (*)    Hemoglobin 11.2 (*)    HCT 33.0 (*)    All other components within normal limits  RESP PANEL BY RT PCR (RSV, FLU A&B, COVID)  ETHANOL  PROTIME-INR  APTT  COMPREHENSIVE METABOLIC PANEL  RAPID URINE DRUG SCREEN, HOSP PERFORMED  URINALYSIS, ROUTINE W REFLEX MICROSCOPIC    CT Angio Head W or Wo Contrast  Final Result    CT Angio Neck W and/or Wo Contrast  Final Result    CT HEAD CODE STROKE WO CONTRAST  Final Result      Medications  iohexol (OMNIPAQUE) 350 MG/ML injection 75 mL (75 mLs Intravenous Contrast Given 05/03/20 1316)     Procedures  /  Critical Care .Critical Care Performed by: Maudie Flakes, MD Authorized by: Maudie Flakes, MD   Critical care provider statement:    Critical care time (minutes):  80   Critical care was necessary to treat or prevent imminent or life-threatening deterioration of the following conditions:  CNS failure or compromise (Concern for acute ischemic stroke)   Critical care was time spent personally by me on the following activities:  Discussions with consultants, evaluation of patient's response to treatment, examination of patient, ordering and performing treatments and interventions, ordering and review of laboratory studies, ordering and review of radiographic studies, pulse oximetry, re-evaluation of patient's condition, obtaining history from patient or surrogate and review of old charts    ED Course and Medical Decision Making  I have reviewed the triage vital signs, the nursing notes, and pertinent available records from the EMR.  Listed above are laboratory and imaging tests that I personally ordered, reviewed, and interpreted and then considered in my medical  decision making (see below for details).  Concern for possible acute ischemic stroke with last known normal 10 AM, therefore code stroke initiated and patient is in route to CT.  Patient's most concerning physical exam feature would be her speech disturbance, unable to repeat phrases accurately, seems to have trouble finding the proper word, but at the same time seems to have comprehension of phrases and able to follow commands.  Patient is anticoagulated and therefore likely not a TPA candidate but given the aphasia and possibility of large vessel occlusion, IR intervention is a possibility.  Obtaining CTA head and neck with perfusion study.     CTA is revealing left M2 occlusion of a distal branch.  Evaluated by telemetry neurology physician Dr. Rory Percy, NIH stroke scale is currently 6, she is potentially candidate for IR intervention though the severity of the stroke is mild and the risk of IR intervention is something to definitely consider.  At this point he will be up to patient and patient's family.  Dr. Malen Gauze to discuss with family and from there we will determine definitive dispo plan.  Case was discussed with neuro interventional team, initially with patient being within the 6-hour window CT perfusion was thought to be unnecessary but now with this particular presentation at NIH stroke scale, plan is to proceed with CT perfusion scan to determine if patient would benefit enough from the procedure to warrant the risks.  CT perfusion scan shows that procedure would be favorable, patient to be rapidly transported to [facility] directly to the IR suite, will pass to the emergency department for airway assessment, no airway concerns at this time.  Dr. Curly Shores of [facility] is the accepting physician for transfer.  Dr. Sherry Ruffing the [facility], ED physician also made aware.  Barth Kirks. Sedonia Small, MD [place] [email or web address]  Final Clinical  Impressions(s) / ED Diagnoses     ICD-10-CM   1. Acute ischemic stroke (HCC)  I63.9  ED Discharge Orders    None       Discharge Instructions Discussed with and Provided to Patient:   Discharge Instructions   None       Maudie Flakes, MD 05/03/20 1423    Maudie Flakes, MD 05/03/20 1423

## 2020-05-03 NOTE — Anesthesia Procedure Notes (Addendum)
Arterial Line Insertion Start/End11/08/2019 3:21 PM, 05/03/2020 3:23 PM Performed by: Effie Berkshire, MD, anesthesiologist  Patient location: OOR procedure area. Preanesthetic checklist: patient identified, IV checked, site marked, risks and benefits discussed, surgical consent, monitors and equipment checked, pre-op evaluation, timeout performed and anesthesia consent Left, radial was placed Catheter size: 20 G Hand hygiene performed  and maximum sterile barriers used   Attempts: 1 Procedure performed without using ultrasound guided technique. Following insertion, dressing applied and Biopatch. Post procedure assessment: normal  Patient tolerated the procedure well with no immediate complications.

## 2020-05-03 NOTE — Consult Note (Signed)
NAME:  Sheila Ray, MRN:  [id number], DOB:  [birth year], LOS: 0 ADMISSION DATE:  05/03/2020, CONSULTATION DATE:  05/03/20 REFERRING MD:  Curly Shores  CHIEF COMPLAINT:  A.fib RVR   Brief History   Sheila Ray is a 79 y.o. female who was admitted 11/3 with left M2/M3 occlusion s/p neuro IR revascularization.  Extubated post procedure then had A.fib with RVR (has underlying A.fib); therefore, PCCM asked to assist.  History of present illness   Pt is encephelopathic; therefore, this HPI is obtained from chart review Sheila Ray is a 79 y.o. female who has a PMH including but not limited to A.fib on xarelto, HTN, HLD, DM, hypothyroidism, prior left thalamic stroke with residual deficits (see "past medical history" for rest).  She presented to [facility] ED 11/3 with confusion, aphasia, right facial droop, right leg weakness.  She was found to have Left M2/M3 occlusion with penumbra of 33ml.  She was taken to IR and had right common carotid arteriogram followed by revascularization of occluded superior division M2 branch and partial revascularization of the distal M3 branch with mechanical thrombolysis.  Post CT brain showed no ICH or mass effect.  She was extubated and was transferred to the neuro ICU.  PCCM was asked to assist with management of her A.fib with RVR.  Past Medical History  has Vitamin D deficiency; Obesity (BMI 30-39.9); DM (diabetes mellitus) ([place]); Hypertension associated with diabetes ([place]); Hyperlipidemia associated with type 2 diabetes mellitus ([place]); Osteoporosis; Stroke [facility]); Hypothyroid; OA (osteoarthritis) of knee; Atrial fibrillation ([place]); Acute ischemic left MCA stroke ([place]); and Middle cerebral artery embolism, left on their problem list.  [place] Events   11/3 > admit.  Consults:  Neuro, PCCM.  Procedures:  ETT 11/3 > 11/3. L radial art line 11/3 >   Significant Diagnostic Tests:  CT / CTA head 11/3 > no hemorrhage or acute infarction.  Occlusion of left  M2 MCA branch. CTP brain 11/3 > no core infarction.  21ml penumbra in posterior left MCA territory. CT head 11/3 >  MRI brain 11/4 >  Echo 11/4 >   Micro Data:  Flu 11/3 > neg. RSV 11/3 > neg. COVID 11/3 > neg.  Antimicrobials:  None.   Interim history/subjective:  Awake but not following all commands.  Seems aphasic. Able to hold left arm up against gravity.  Objective:  Blood pressure 119/80, pulse (!) 132, temperature 97.8 F (36.6 C), resp. rate (!) 21, SpO2 97 %.        Intake/Output Summary (Last 24 hours) at 05/03/2020 1957 Last data filed at 05/03/2020 1900 Gross per 24 hour  Intake 1117.59 ml  Output 10 ml  Net 1107.59 ml   There were no vitals filed for this visit.  Examination: General: Adult female, in NAD. Neuro: Awake but not following all commands.  Able to hold left arm up against gravity.  Wiggles toes.  Appears aphasic. HEENT: [place]/AT. Sclerae anicteric.  EOMI. Cardiovascular: RRR, no M/R/G.  Lungs: Respirations even and unlabored.  CTA bilaterally, No W/R/R.  Abdomen: BS x 4, soft, NT/ND.  Musculoskeletal: No gross deformities, no edema.  Skin: Intact, warm, no rashes.  Assessment & Plan:   Left M2/M3 occlusion - s/p right common carotid arteriogram followed by revascularization of occluded superior division M2 branch and partial revascularization of the distal M3 branch with mechanical thrombolysis. - Post procedure management per IR. - Stroke workup / management per neuro. - F/u on CT head, MRI brain, echo.  Hx HTN, HLD. - Cleviprex PRN for goal SBP 120 - 140 per neuro. - Continue home toprol. - Hold home lisinopril, diltiazem until can safely take PO then can consider resuming.  Hx A.fib (on xarelto) - now with RVR. - Lopressor 5mg  q3hrs PRN for sustained HR > 130. - Hold home xarelto. - Defer timing of anticoagulation to neuro.  Hx hypothyroidism. - Assess TSH.  Hx DM.  - SSI. - Hold home metformin.   Best Practice:  Diet:  NPO. Pain/Anxiety/Delirium protocol (if indicated): N/A. VAP protocol (if indicated): N/A. DVT prophylaxis: SCD's / Heparin. GI prophylaxis: N/A. Glucose control: SSI. Mobility: Bedrest. Code Status: Full. Family Communication: Son updated at bedside. Disposition: ICU.  Labs   CBC: Recent Labs  Lab 05/03/20 1254 05/03/20 1259 05/03/20 1320 05/03/20 1813  WBC 13.4*  --   --  15.6*  NEUTROABS 10.9*  --   --   --   HGB 11.5* 12.6 11.2* 10.6*  HCT 37.6 37.0 33.0* 36.0  MCV 77.8*  --   --  78.6*  PLT 338  --   --  643*   Basic Metabolic Panel: Recent Labs  Lab 05/03/20 1259 05/03/20 1317 05/03/20 1320  NA 134* 131* 136  K 4.1 3.7 3.8  CL 108 102 101  CO2  --  22  --   GLUCOSE 132* 132* 127*  BUN 10 11 9   CREATININE 0.50 0.64 0.60  CALCIUM  --  8.6*  --    GFR: Estimated Creatinine Clearance: 58.8 mL/min (by C-G formula based on SCr of 0.6 mg/dL). Recent Labs  Lab 05/03/20 1254 05/03/20 1813  WBC 13.4* 15.6*   Liver Function Tests: Recent Labs  Lab 05/03/20 1317  AST 15  ALT 12  ALKPHOS 52  BILITOT 0.5  PROT 5.7*  ALBUMIN 3.0*   No results for input(s): LIPASE, AMYLASE in the last 168 hours. No results for input(s): AMMONIA in the last 168 hours. ABG    Component Value Date/Time   TCO2 22 05/03/2020 1320    Coagulation Profile: Recent Labs  Lab 05/03/20 1317  INR 1.2   Cardiac Enzymes: No results for input(s): CKTOTAL, CKMB, CKMBINDEX, TROPONINI in the last 168 hours. HbA1C: HB A1C (BAYER DCA - WAIVED)  Date/Time Value Ref Range Status  03/12/2019 09:29 AM 6.4 <7.0 % Final    Comment:                                          Diabetic Adult            <7.0                                       Healthy Adult        4.3 - 5.7                                                           (DCCT/NGSP) American Diabetes Association's Summary of Glycemic Recommendations for Adults with Diabetes: Hemoglobin A1c <7.0%. More stringent glycemic goals  (A1c <6.0%) may further reduce complications at the cost of increased risk of  hypoglycemia.   10/29/2018 08:42 AM 6.6 <7.0 % Final    Comment:                                          Diabetic Adult            <7.0                                       Healthy Adult        4.3 - 5.7                                                           (DCCT/NGSP) American Diabetes Association's Summary of Glycemic Recommendations for Adults with Diabetes: Hemoglobin A1c <7.0%. More stringent glycemic goals (A1c <6.0%) may further reduce complications at the cost of increased risk of hypoglycemia.    CBG: Recent Labs  Lab 05/03/20 1250 05/03/20 1257 05/03/20 1921  GLUCAP 127* 131* 234*    Review of Systems:   Unable to obtain as pt is encephalopathic.  Past medical history  She,  has a past medical history of Arthritis, Atrial fibrillation ([place]), Complication of anesthesia, Diabetes mellitus without complication ([place]), Diverticulosis, Fatigue, Gastric polyp, Goiter, Hyperlipidemia, Hypertension, Hypothyroid, Obese, Osteopenia, PONV (postoperative nausea and vomiting), Postmenopausal, Rosacea, Stroke ([place]) (01/15/2013), and Vitamin D deficiency.   Surgical History    Past Surgical History:  Procedure Laterality Date  . ABDOMINAL HYSTERECTOMY  1985  . FOOT SURGERY Right 08/2002  . right knee arthroscopy   2013   . TONSILLECTOMY    . 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   Procedure: RIGHT TOTAL KNEE ARTHROPLASTY;  Surgeon: Gearlean Alf, MD;  Location: [facility];  Service: Orthopedics;  Laterality: Right;     Social History   reports that she has never smoked. She has never used smokeless tobacco. She reports that she does not drink alcohol and does not use drugs.   Family history   Her family history includes Alzheimer's disease (age of onset: 7) in her mother; Arthritis in her brother, mother, sister, and sister; Cancer in her father and sister; Heart attack (age of onset: 36) in her  brother; Heart disease in her son; Obesity in her sister; Osteoporosis in her mother.   Allergies Allergies  Allergen Reactions  . Crestor [Rosuvastatin] Other (See Comments)    Cramps  . Lipitor [Atorvastatin] Other (See Comments)    Cramps  . Pravastatin Other (See Comments)    Cramps   . Keflex [Cephalexin] Hives     Home meds  Prior to Admission medications   Medication Sig Start Date End Date Taking? Authorizing Provider  alendronate (FOSAMAX) 70 MG tablet TAKE 1 TABLET BY MOUTH  WEEKLY AS DIRECTED Patient taking differently: Take 70 mg by mouth once a week.  03/15/19   [name] A, FNP  Cyanocobalamin (VITAMIN B12) 1000 MCG TBCR Take 1,000 mcg by mouth.     [provider]  diltiazem (CARDIZEM CD) 240 MG 24 hr capsule TAKE 1 CAPSULE BY MOUTH  DAILY Patient taking differently: Take 240 mg by mouth daily.  03/18/19  Hawks, Christy A, FNP  glucose blood (ONE TOUCH ULTRA TEST) test strip USE TO CHECK BLOOD GLUCOSE  ONCE A DAY AS INSTRUCTED 12/16/19   [name] A, FNP  lisinopril (ZESTRIL) 40 MG tablet TAKE 1 TABLET BY MOUTH  DAILY Patient taking differently: Take 40 mg by mouth daily.  03/18/19   Sharion Balloon, FNP  metFORMIN (GLUCOPHAGE) 1000 MG tablet Take 0.5 tablets (500 mg total) by mouth daily with breakfast. 12/16/19   [name] A, FNP  metoprolol succinate (TOPROL-XL) 50 MG 24 hr tablet TAKE 1 TABLET BY MOUTH  DAILY WITH OR IMMEDIATELY  FOLLOWING A MEAL Patient taking differently: Take 50 mg by mouth daily.  02/16/20   Sharion Balloon, FNP  ONETOUCH DELICA LANCETS 03Y MISC 1 each by Does not apply route daily. Use to check BG daily 09/12/17   Hawks, Alyse Low A, FNP  XARELTO 20 MG TABS tablet TAKE 1 TABLET BY MOUTH  DAILY Patient taking differently: Take 20 mg by mouth daily with supper.  12/03/18   Sharion Balloon, FNP    Critical care time: 35 min.    Montey Hora, [facility] 05/03/2020, 7:57 PM

## 2020-05-03 NOTE — Code Documentation (Signed)
Stroke Response Nurse Documentation Code Documentation  [place] is a 79 y.o. female arriving to [facility] ED via [facility] on 05/03/20 with past medical hx significant of atrial fibrillation on Xarelto, diabetes, hyperlipidemia, hypertension, hypothyroid, obese, stroke. Code stroke workup completed at [facility] ED.   Patient was last known well at 1000 when she developed acute onset of confusion and right facial weakness. NIHSS 6 at [facility], see documentation for details. The following imaging was completed at  ED:  CT, CTA head and neck, CTP. Labs completed and COVID swab sent to lab. Patient is not a candidate for tPA due to on Xarelto. Patient sent to [facility] for IR.   Stroke team at the bedside on patient arrival to [facility] ED at 1452. Patient cleared for CT by Dr. Gustavus Messing. Patient to IR bay8 at 1454. NIHSS 3, see documentation for details and code stroke times. Patient with right facial droop, right leg weakness and dysarthria  on exam. Patient intubated 1509. To IR suite 1511. Care/Plan admit to ICU post IR procedure. Bedside handoff with IR team.    Leverne Humbles Stroke Response RN

## 2020-05-03 NOTE — Consult Note (Signed)
Neurology H&P  CC: trouble speaking  History is obtained from: Patient and son as well as chart review  HPI: Sheila Ray is a 79 y.o. 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  Please see excellent note by Dr. Rory Percy for further details of her presentation.  In brief, she went to urgent care for evaluation of palpitations and was found to be confused after getting clonidine and labetalol to treat her A. fib with RVR.  Her initial code stroke evaluation was with NIH of 6 for mild right leg drift, right facial droop, not being able to answer orientation questions and mild aphasia as well as mild dysarthria.  CTA CTP demonstrated a left M3 cutoff point with 66 mL of tissue at risk.  The risk benefit ratio was discussed with the family given the disabling effects of aphasia and the relatively large area at risk decision was made to proceed with thrombectomy.    Post thrombectomy paralytic was reversed and patient was extubated.  She was noted to have much more marked right-sided weakness and aphasia, with a new left-sided gaze preference compared to prior to intervention, although post intervention scan was negative for any significant hemorrhage.  This was initially attributed to postanesthesia effect, but was persistent.  Her heart rates were noted to be in the 140s with oxygen saturations in the 90s, for which CCM was consulted.  LKW: 10 AM on 11/3 tPA given?: No, due to last dose of Xarelto10/3 AM  IA performed?: Yes Premorbid modified rankin scale:      0 - No symptoms.   ROS: Patient denied any significant headache, double vision, nausea, vomiting, fevers, chills, sweats, cough, bowel or bladder issues, or recent episodes of  weakness and reported that other than her palpitations she had been feeling quite well lately.  Past Medical History:  Diagnosis Date  . Arthritis   . Atrial fibrillation ([place])   . Complication of anesthesia   . Diabetes mellitus without complication ([place])   . Diverticulosis   . Fatigue   . Gastric polyp   . Goiter   . Hyperlipidemia   . Hypertension   . Hypothyroid    taken off of thyroid medication 2012014   . Obese   . Osteopenia   . PONV (postoperative nausea and vomiting)   . Postmenopausal   . Rosacea   . Stroke ([place]) 01/15/2013   left thalamic  stroke, small vessel  . Vitamin D deficiency    Past Surgical History:  Procedure Laterality Date  . ABDOMINAL HYSTERECTOMY  1985  . FOOT SURGERY Right 08/2002  . right knee arthroscopy   2013   . TONSILLECTOMY    . 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   Procedure: RIGHT TOTAL KNEE ARTHROPLASTY;  Surgeon: Gearlean Alf, MD;  Location: [facility];  Service: Orthopedics;  Laterality: Right;   Current Outpatient Medications  Medication Instructions  . alendronate (FOSAMAX) 70 MG tablet TAKE 1 TABLET BY MOUTH  WEEKLY AS DIRECTED  . diltiazem (CARDIZEM CD) 240 MG 24 hr capsule TAKE 1 CAPSULE BY MOUTH  DAILY  . glucose blood (ONE TOUCH ULTRA TEST) test strip USE TO CHECK BLOOD GLUCOSE  ONCE A DAY AS INSTRUCTED  . lisinopril (ZESTRIL) 40 MG tablet TAKE 1 TABLET BY MOUTH  DAILY  .  metFORMIN (GLUCOPHAGE) 500 mg, Oral, Daily with breakfast  . metoprolol succinate (TOPROL-XL) 50 MG 24 hr tablet TAKE 1 TABLET BY MOUTH  DAILY WITH OR IMMEDIATELY  FOLLOWING A MEAL  . ONETOUCH DELICA LANCETS 05L MISC 1 each, Does not apply, Daily, Use to check BG daily  . Vitamin B12 1,000 mcg, Oral  . XARELTO 20 MG TABS tablet TAKE 1 TABLET BY MOUTH  DAILY     Family History  Problem Relation Age of Onset  . Osteoporosis Mother   . Alzheimer's disease Mother 54  . Arthritis Mother   . Cancer Father        Lung  . Arthritis Sister   . Obesity  Sister   . Heart attack Brother 20  . Heart disease Son   . Arthritis Brother   . Arthritis Sister   . Cancer Sister        breast cancer   Social History:  reports that she has never smoked. She has never used smokeless tobacco. She reports that she does not drink alcohol and does not use drugs.  Exam: Current vital signs: BP (!) 143/76   Pulse (!) 123   Temp 98.4 F (36.9 C) (Oral)   Resp 19   SpO2 95%  Vital signs in last 24 hours: Temp:  [98 F (36.7 C)-98.4 F (36.9 C)] 98.4 F (36.9 C) (11/03 1326) Pulse Rate:  [123-183] 123 (11/03 1420) Resp:  [19-25] 19 (11/03 1420) BP: (140-170)/(76-109) 143/76 (11/03 1407) SpO2:  [90 %-97 %] 95 % (11/03 1420)   Physical Exam  Constitutional: Appears well-developed and well-nourished.  Psych: Affect somewhat flat Eyes: No scleral injection HENT: No OP obstruction, good dentition  MSK: no joint deformities.  Cardiovascular: Irregularly irregular  Respiratory: Effort normal, non-labored breathing GI: Soft.  No distension. There is no tenderness.  Skin: WDI  Neuro: Mental Status: Patient is awake, alert, oriented to person, place, month, year, and situation. Patient is able to give a clear and coherent history though at times hesitates in her speech or works around words she can't say. No neglect, is aware of her deficits  Cranial Nerves: II: Visual Fields are full. Pupils are equal, round, and reactive to light.  3 -> 2 mm  III,IV, VI: EOMI without ptosis or diploplia.  V: Facial sensation is symmetric to temperature VII: Facial movement is notable for mild right facial droop  VIII: hearing is intact to voice X: Uvula elevates symmetrically XI: Shoulder shrug is symmetric. XII: tongue is midline without atrophy or fasciculations.  Motor: Tone is normal. Bulk is normal. Mild pronation of bilateral arms without drift. No drift bilateral legs  Sensory: Sensation is symmetric to light touch and temperature in the arms and  legs. Deep Tendon Reflexes: 2+ and symmetric in the biceps and patellae.  Plantars: Toes are downgoing bilaterally.  Cerebellar: FNF and foot to hand are intact bilaterally  I have reviewed labs in epic and the results pertinent to this consultation are: Cr 0.6 Glu 127   I have reviewed the images obtained: Head CT without acute process CTA w/ left M3 trifrucation and cutoff at an M3 branch   Impression: L M3 stroke, likely cardioembolic in the setting of known Afib.  Recommendations: # L M3 branch stroke - Stroke labs TSH, HgbA1c, fasting lipid panel - MRI brain  - MRA of the brain without contrast  - Frequent neuro checks - Echocardiogram - Carotid dopplers - Prophylactic therapy-Antiplatelet med: Aspirin - dose 325mg  PO or  300mg  PR, if no significant bleeding during procedure, to be continued until [facility] resumed - AC resumption date pending final stroke size  - Risk factor modification - Telemetry monitoring; 30 day event monitor on discharge if no arrythmias captured  - Blood pressure goal   - Post successful uncomplicated revascularization SBP 120 - 140 for 24 hours; if complications have arisen or only partial revascularization reach out to interventionalist or neurologist on call for BP goal - PT consult, OT consult, Speech consult, unless patient is back to baseline - Stroke team to follow  # Afib - home metop XL 50 daily replaced with 12.5 IR mg q8hr to avoid rebound RVR - consider evaluation for PE if tachycardia persists - management of RVR per CCM team  # HTN - holding home medications  # CHF, concern for pulmonary edema -Appreciate management by CCM  #History of hypothyroidism -Follow-up TSH as above  #Diabetes -Every 4 hours glucose checks and low-dose sliding scale insulin while n.p.o.  Lesleigh Noe MD-PhD [facility] [phone]

## 2020-05-03 NOTE — Anesthesia Procedure Notes (Signed)
Procedure Name: Intubation Date/Time: 05/03/2020 3:08 PM Performed by: Mariea Clonts, CRNA Pre-anesthesia Checklist: Patient identified, Emergency Drugs available, Suction available and Patient being monitored Patient Re-evaluated:Patient Re-evaluated prior to induction Oxygen Delivery Method: Circle System Utilized Preoxygenation: Pre-oxygenation with 100% oxygen Induction Type: IV induction Laryngoscope Size: Glidescope and 3 Grade View: Grade I Tube type: Oral Tube size: 7.0 mm Number of attempts: 1 Airway Equipment and Method: Stylet and Oral airway Placement Confirmation: ETT inserted through vocal cords under direct vision,  positive ETCO2 and breath sounds checked- equal and bilateral Tube secured with: Tape Dental Injury: Teeth and Oropharynx as per pre-operative assessment

## 2020-05-03 NOTE — Progress Notes (Signed)
Patient ID: Sheila Ray, female   DOB: 1941/05/16, 79 y.o.   MRN: [id number] INR. 79 Yr RT H F MRS 0. LSW 10 am.  11/3  New onset of speech difficulties and RT leg weakness and RT facial droop. CT brain  NO ICH . ASPECTS 10. CTA occluded distal M3 seg inf division and M2 seg of sup division. CTP maps no core with a penumbra of 30ml. Endovascular treatment D/W son .  Procedure ,rasons and alternatives reviewed. Risks of ICH of 10 % ,worsening neuro deficit death and inability to revascularize reviewed. Son expressed understanding and provided consent to proceed. S.Keyshon Stein MD

## 2020-05-03 NOTE — Progress Notes (Signed)
S: Patient now moving somewhat more than previously, but continues to be obtunded. MRI has been completed  O: BP 106/64   Pulse (!) 105   Temp (!) 96.2 F (35.7 C) (Axillary) Comment: RN notified, blankets applied  Resp 13   SpO2 95%   Follow up exam: Ment: Obtunded. Moves LUE semipurposefully and groans to noxious. Not following commands or attempting to communicate.  CN: PERRL. Eyes mildly exotropic and at midline. Suppressed doll's eye reflex. Face grossly symmetric with no grimace to noxious.  Motor/Sensory:  LUE moves semipurposefully to noxious 4/5 LLE withdraws and moves in agitated fashion to noxious 4/5 RUE 2/5 movement to noxious with lag and decreased amplitude RLE 2/5 withdrawal to noxious Other: No improvement to mentation with 2 mg IV Ativan. No adventitious movements concerning for seizure.   MRI HEAD IMPRESSION: 1. 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 Associated minimal petechial hemorrhage at the left temporal region without significant mass effect. 2. Underlying age-related cerebral atrophy with moderate chronic microvascular ischemic disease.  MRA HEAD IMPRESSION: 1. Technically limited exam due to motion artifact. 2. Previously identified occluded left M2 branch appears grossly patent status post revascularization. 3. Otherwise grossly stable and negative intracranial MRA. No other large vessel occlusion. No other hemodynamically significant or correctable stenosis.  A/R: 79 year old female with left MCA stroke s/p revascularization 1. The patient is obtunded on exam with right sided weakness. Mental status has improved somewhat since initial post-VIR exams.  2. No seizure activity noted on exam. No improvement to obtundation with IV Ativan 3. Will obtain EEG in the AM.  4. Continue to closely monitor  10 minutes of critical care time.   Electronically signed: Dr. Kerney Elbe

## 2020-05-03 NOTE — Progress Notes (Signed)
[facility] Progress Note Patient Name: Sheila Ray DOB: 19-Mar-1941 MRN: [id number]   Date of Service  05/03/2020  HPI/Events of Note  Hypotension - BP = 106/64. SBP goal = 120-140.  eICU Interventions  Plan: 1. Phenylephrine IV infusion. Titrate to SBP = 120-140.     Intervention Category Major Interventions: Hypotension - evaluation and management  Kreed Kauffman [place] 05/03/2020, 10:30 PM

## 2020-05-03 NOTE — Procedures (Signed)
S/P RT common carotid arteriogram followed by revascularization of occluded sup division M2 branch with x 2 passes with  Contact aspiration using Zoom 35 and 55 aspiration catheters and partially of  the distal M3 region of inf division with mechanical thrombolysis achieving a TICI 2C revascularization. Recanalizing Rt ACA A2/A3 junction filling defect noted . Post CT brain No ICH or mass effect noted. RT groin sheath removed  With manual compression for hemostasis. Distal pulses dopplerable.  Extubated. Moving LTarm and leg and RT leg spontaneously.RTLE weaker. Not obeying simple instructions yet. Pupils 23mm RT = LT .  S.Chinmay Squier MD

## 2020-05-03 NOTE — ED Notes (Signed)
Pt back from CT

## 2020-05-03 NOTE — ED Notes (Signed)
Stroke swallow screen has not yet been performed. Documentation of completed nursing task in error.

## 2020-05-03 NOTE — Progress Notes (Signed)
[place] Progress Note Patient Name: AUTUMNE KALLIO DOB: Aug 15, 1940 MRN: [id number]   Date of Service  05/03/2020  HPI/Events of Note  Hypotension - BP = 106/64. SBP gaol = 120-140.   eICU Interventions  Plan: 1. Phenylephrine IV infusion via PIV. Titrate to SBP = 120-140.  2. Hold Metoprolol IV for SBP < 120. 3. Hold Metoprolol per tube for SBP < 120.      Intervention Category Major Interventions: Hypotension - evaluation and management  Lysle Dingwall 05/03/2020, 10:36 PM

## 2020-05-03 NOTE — Progress Notes (Signed)
Patient BP below order parameters of SBP 120-140, SBP currently at 100. MD Deveshwar notified, he advised to contact Neurology.   [phone] MD Cheral Marker, neurology advised to call CCM for blood pressure control, they have consulted on the case. STAT EEG ordered by neurology  [place] contacted for blood pressure consult. They are evaluating situation and will get back with some orders.

## 2020-05-03 NOTE — ED Notes (Signed)
CODE STROKE PAGED @ [phone]

## 2020-05-03 NOTE — Progress Notes (Signed)
Pt arrived to 4N ICU at [place]. PACU RN at bedside, ICU RN at bedside. Report received from IR RN. Placed to unit monitor. Pt on 6L FM. Pt in AF, 110s-140s. Cleviprex infusing at 8 mg/hr. NS at 75. Dr. Curly Shores at bedside to evaluate at 1830. Made aware of increased NIH, transient HR increases to 150s and that Cleviprex off d/t SBP less than 120. No orders received. Dr. Curly Shores to consult CCM to manage Afib with RVR and BP management. If pt is not back to baseline NIH of 3-6 within 3 hrs, Dr. Curly Shores plans for repeat head CT. If pt showing rapid decline in neuro status, she wants a Stat head CT ordered.

## 2020-05-03 NOTE — Progress Notes (Signed)
A line is positional, per Dr Cheral Marker, we can go by cuff pressure

## 2020-05-03 NOTE — Anesthesia Preprocedure Evaluation (Addendum)
Anesthesia Evaluation  Patient identified by MRN, date of birth, ID band Patient awake    Reviewed: Allergy & Precautions, NPO status , Patient's Chart, lab work & pertinent test resultsPreop documentation limited or incomplete due to emergent nature of procedure.  History of Anesthesia Complications (+) PONV and history of anesthetic complications  Airway Mallampati: II  TM Distance: >3 FB Neck ROM: Full    Dental  (+) Teeth Intact, Dental Advisory Given   Pulmonary    Pulmonary exam normal        Cardiovascular hypertension, + dysrhythmias Atrial Fibrillation  Rhythm:Irregular Rate:Tachycardia     Neuro/Psych CVA, Residual Symptoms    GI/Hepatic negative GI ROS, Neg liver ROS,   Endo/Other  diabetesHypothyroidism   Renal/GU negative Renal ROS     Musculoskeletal  (+) Arthritis ,   Abdominal Normal abdominal exam  (+)   Peds  Hematology negative hematology ROS (+)   Anesthesia Other Findings   Reproductive/Obstetrics                           Anesthesia Physical Anesthesia Plan  ASA: III and emergent  Anesthesia Plan: General   Post-op Pain Management:    Induction: Rapid sequence, Cricoid pressure planned and Intravenous  PONV Risk Score and Plan: 3 and Ondansetron and Treatment may vary due to age or medical condition  Airway Management Planned: Oral ETT and Video Laryngoscope Planned  Additional Equipment: Arterial line  Intra-op Plan:   Post-operative Plan: Extubation in OR  Informed Consent: I have reviewed the patients History and Physical, chart, labs and discussed the procedure including the risks, benefits and alternatives for the proposed anesthesia with the patient or authorized representative who has indicated his/her understanding and acceptance.     Only emergency history available  Plan Discussed with: CRNA  Anesthesia Plan Comments:          Anesthesia Quick Evaluation

## 2020-05-03 NOTE — Sedation Documentation (Signed)
Sheath removed and manual pressure started by rad tech

## 2020-05-03 NOTE — Transfer of Care (Signed)
Immediate Anesthesia Transfer of Care Note  Patient: Jalaysia L Teale  Procedure(s) Performed: IR WITH ANESTHESIA (N/A )  Patient Location: ICU  Anesthesia Type:General  Level of Consciousness: awake; opens eyes, able to follow some/occasional commands. Neuro team to evaluate  Airway & Oxygen Therapy: Patient Spontanous Breathing and Patient connected to face mask oxygen  Post-op Assessment: Report given to RN and Post -op Vital signs reviewed and stable  Post vital signs: Reviewed and stable  Last Vitals:  Vitals Value Taken Time  BP    Temp    Pulse 129 05/03/20 1802  Resp 24 05/03/20 1801  SpO2 93 % 05/03/20 1802  Vitals shown include unvalidated device data.  Last Pain:  Vitals:   05/03/20 1326  TempSrc: Oral  PainSc:          Complications: No complications documented.

## 2020-05-03 NOTE — H&P (Signed)
Neurology H&P  CC: trouble speaking  History is obtained from: Patient and son as well as chart review  HPI: Sheila Ray is a 79 y.o. 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  Please see excellent note by Dr. Rory Percy for further details of her presentation.  In brief, she went to urgent care for evaluation of palpitations and was found to be confused after getting clonidine and labetalol to treat her A. fib with RVR.  Her initial code stroke evaluation was with NIH of 6 for mild right leg drift, right facial droop, not being able to answer orientation questions and mild aphasia as well as mild dysarthria.  CTA CTP demonstrated a left M3 cutoff point with 66 mL of tissue at risk.  The risk benefit ratio was discussed with the family given the disabling effects of aphasia and the relatively large area at risk decision was made to proceed with thrombectomy.    Post thrombectomy paralytic was reversed and patient was extubated.  She was noted to have much more marked right-sided weakness and aphasia, with a new left-sided gaze preference compared to prior to intervention, although post intervention scan was negative for any significant hemorrhage.  This was initially attributed to postanesthesia effect, but was persistent.  Her heart rates were noted to be in the 140s with oxygen saturations in the 90s, for which CCM was consulted.  LKW: 10 AM on 11/3 tPA given?: No, due to last dose of Xarelto10/3 AM  IA performed?: Yes Premorbid modified rankin scale:      0 - No symptoms.   ROS: Patient denied any significant headache, double vision, nausea, vomiting, fevers, chills, sweats, cough, bowel or bladder issues, or recent episodes of  weakness and reported that other than her palpitations she had been feeling quite well lately.  Past Medical History:  Diagnosis Date  . Arthritis   . Atrial fibrillation ([place])   . Complication of anesthesia   . Diabetes mellitus without complication ([place])   . Diverticulosis   . Fatigue   . Gastric polyp   . Goiter   . Hyperlipidemia   . Hypertension   . Hypothyroid    taken off of thyroid medication 2012014   . Obese   . Osteopenia   . PONV (postoperative nausea and vomiting)   . Postmenopausal   . Rosacea   . Stroke ([place]) 01/15/2013   left thalamic  stroke, small vessel  . Vitamin D deficiency    Past Surgical History:  Procedure Laterality Date  . ABDOMINAL HYSTERECTOMY  1985  . FOOT SURGERY Right 08/2002  . right knee arthroscopy   2013   . TONSILLECTOMY    . 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   Procedure: RIGHT TOTAL KNEE ARTHROPLASTY;  Surgeon: Gearlean Alf, MD;  Location: [facility];  Service: Orthopedics;  Laterality: Right;   Current Outpatient Medications  Medication Instructions  . alendronate (FOSAMAX) 70 MG tablet TAKE 1 TABLET BY MOUTH  WEEKLY AS DIRECTED  . diltiazem (CARDIZEM CD) 240 MG 24 hr capsule TAKE 1 CAPSULE BY MOUTH  DAILY  . glucose blood (ONE TOUCH ULTRA TEST) test strip USE TO CHECK BLOOD GLUCOSE  ONCE A DAY AS INSTRUCTED  . lisinopril (ZESTRIL) 40 MG tablet TAKE 1 TABLET BY MOUTH  DAILY  .  metFORMIN (GLUCOPHAGE) 500 mg, Oral, Daily with breakfast  . metoprolol succinate (TOPROL-XL) 50 MG 24 hr tablet TAKE 1 TABLET BY MOUTH  DAILY WITH OR IMMEDIATELY  FOLLOWING A MEAL  . ONETOUCH DELICA LANCETS 61P MISC 1 each, Does not apply, Daily, Use to check BG daily  . Vitamin B12 1,000 mcg, Oral  . XARELTO 20 MG TABS tablet TAKE 1 TABLET BY MOUTH  DAILY     Family History  Problem Relation Age of Onset  . Osteoporosis Mother   . Alzheimer's disease Mother 20  . Arthritis Mother   . Cancer Father        Lung  . Arthritis Sister   . Obesity  Sister   . Heart attack Brother 73  . Heart disease Son   . Arthritis Brother   . Arthritis Sister   . Cancer Sister        breast cancer   Social History:  reports that she has never smoked. She has never used smokeless tobacco. She reports that she does not drink alcohol and does not use drugs.  Exam: Current vital signs: BP 106/64   Pulse (!) 105   Temp (!) 96.2 F (35.7 C) (Axillary) Comment: RN notified, blankets applied  Resp 13   SpO2 95%  Vital signs in last 24 hours: Temp:  [96.2 F (35.7 C)-98.4 F (36.9 C)] 96.2 F (35.7 C) (11/03 2000) Pulse Rate:  [46-183] 105 (11/03 2215) Resp:  [12-25] 13 (11/03 2215) BP: (99-170)/(58-109) 106/64 (11/03 2200) SpO2:  [90 %-100 %] 95 % (11/03 2215) Arterial Line BP: (98-143)/(50-71) 98/55 (11/03 2215)   Physical Exam  Constitutional: Appears well-developed and well-nourished.  Psych: Affect somewhat flat Eyes: No scleral injection HENT: No OP obstruction, good dentition  MSK: no joint deformities.  Cardiovascular: Irregularly irregular  Respiratory: Effort normal, non-labored breathing GI: Soft.  No distension. There is no tenderness.  Skin: WDI  Neuro: Mental Status: Patient is awake, alert, oriented to person, place, month, year, and situation. Patient is able to give a clear and coherent history though at times hesitates in her speech or works around words she can't say. No neglect, is aware of her deficits  Cranial Nerves: II: Visual Fields are full. Pupils are equal, round, and reactive to light.  3 -> 2 mm  III,IV, VI: EOMI without ptosis or diploplia.  V: Facial sensation is symmetric to temperature VII: Facial movement is notable for mild right facial droop  VIII: hearing is intact to voice X: Uvula elevates symmetrically XI: Shoulder shrug is symmetric. XII: tongue is midline without atrophy or fasciculations.  Motor: Tone is normal. Bulk is normal. Mild pronation of bilateral arms without drift. No drift  bilateral legs  Sensory: Sensation is symmetric to light touch and temperature in the arms and legs. Deep Tendon Reflexes: 2+ and symmetric in the biceps and patellae.  Plantars: Toes are downgoing bilaterally.  Cerebellar: FNF and foot to hand are intact bilaterally  I have reviewed labs in epic and the results pertinent to this consultation are: Cr 0.6 Glu 127   I have reviewed the images obtained: Head CT without acute process CTA w/ left M3 trifrucation and cutoff at an M3 branch   Impression: L M3 stroke, likely cardioembolic in the setting of known Afib.  Recommendations: # L M3 branch stroke - Stroke labs TSH, HgbA1c, fasting lipid panel - MRI brain  - MRA of the brain without contrast  - Frequent neuro checks - Echocardiogram - Carotid  dopplers - Prophylactic therapy-Antiplatelet med: Aspirin - dose 325mg  PO or 300mg  PR, if no significant bleeding during procedure, to be continued until [facility] resumed - AC resumption date pending final stroke size  - Risk factor modification - Telemetry monitoring; 30 day event monitor on discharge if no arrythmias captured  - Blood pressure goal   - Post successful uncomplicated revascularization SBP 120 - 140 for 24 hours; if complications have arisen or only partial revascularization reach out to interventionalist or neurologist on call for BP goal - PT consult, OT consult, Speech consult, unless patient is back to baseline - Stroke team to follow  # Afib - home metop XL 50 daily replaced with 12.5 IR mg q8hr to avoid rebound RVR - consider evaluation for PE if tachycardia persists - management of RVR per CCM team  # HTN - holding home medications  # CHF, concern for pulmonary edema -Appreciate management by CCM  #History of hypothyroidism -Follow-up TSH as above  #Diabetes -Every 4 hours glucose checks and low-dose sliding scale insulin while n.p.o.  Lesleigh Noe MD-PhD [facility] [phone]

## 2020-05-03 NOTE — ED Triage Notes (Signed)
EMS reports pt started having slurred speech at 10am and went to pcp's office.  Went to office and HR was 190's and afib.  PCP gave clonidine.  EMS gave 21mg  cardizem.  EMS reports AMS and  slurred speech   CBG 154 per ems.

## 2020-05-03 NOTE — Progress Notes (Signed)
Subjective:  Patient ID: Sheila Ray, female    DOB: 1940-12-14  Age: 79 y.o. MRN: [id number]  CC: Shortness of Breath (Patient states that she has been having sob and slurred words that started around 11am ) and left arm pain   HPI I was called to her treatment room urgently to see [name].  She had been in her usual state of good health until about an hour ago she noted some arm pain.  This seemed to be on the right at first and shifted to the left.  It was not accompanied by chest pain.  However she was having shortness of breath.  She also was slurring her words and having trouble finding words.  Some of her speech just did not make sense.  A neighbor had come to check on her and noticed these things and brought her here.  Upon arrival she was brought to the treatment room and an EKG was done.  That is attached.  Depression screen [facility] 2/9 04/21/2020 02/07/2020 12/28/2019  Decreased Interest 0 0 0  Down, Depressed, Hopeless 0 0 0  PHQ - 2 Score 0 0 0    History Sheila Ray has a past medical history of Arthritis, Atrial fibrillation ([place]), Complication of anesthesia, Diabetes mellitus without complication ([place]), Diverticulosis, Fatigue, Gastric polyp, Goiter, Hyperlipidemia, Hypertension, Hypothyroid, Obese, Osteopenia, PONV (postoperative nausea and vomiting), Postmenopausal, Rosacea, Stroke ([place]) (01/15/2013), and Vitamin D deficiency.   She has a past surgical history that includes Abdominal hysterectomy (1985); Foot surgery (Right, 08/2002); Tonsillectomy; right knee arthroscopy  (2013 ); and Total knee arthroplasty (Right, 07/25/2014).   Her family history includes Alzheimer's disease (age of onset: 67) in her mother; Arthritis in her brother, mother, sister, and sister; Cancer in her father and sister; Heart attack (age of onset: 36) in her brother; Heart disease in her son; Obesity in her sister; Osteoporosis in her mother.She reports that she has never smoked. She has never used  smokeless tobacco. She reports that she does not drink alcohol and does not use drugs.    ROS Review of Systems  Constitutional: Negative.   HENT: Positive for postnasal drip and sore throat (It was a little scratchy from drainage).   Eyes: Negative for visual disturbance.  Respiratory: Positive for cough and shortness of breath. Negative for wheezing.   Cardiovascular: Positive for palpitations. Negative for chest pain.  Gastrointestinal: Negative for abdominal pain.  Musculoskeletal: Positive for myalgias (Left arm as noted in HPI). Negative for arthralgias.    Objective:  BP (!) 170/90    Pulse (!) 176    Temp 98 F (36.7 C) (Temporal)    SpO2 97%   BP Readings from Last 3 Encounters:  05/03/20 (!) 170/90  02/07/20 137/84  02/07/20 114/85    Wt Readings from Last 3 Encounters:  04/21/20 175 lb 0.7 oz (79.4 kg)  02/07/20 177 lb (80.3 kg)  02/07/20 175 lb (79.4 kg)     Physical Exam Constitutional:      General: She is in acute distress.     Appearance: She is well-developed. She is ill-appearing. She is not toxic-appearing or diaphoretic.  HENT:     Head: Normocephalic and atraumatic.     Comments: There was some drawing of her face toward the left indicating some right-sided weakness but this was not borne out on evaluation of the orbicularis or masticators.  As she had full range of motion of each.  She was able to extend her tongue.  Extraocular movements were intact.    Mouth/Throat:     Mouth: Mucous membranes are moist.  Eyes:     Extraocular Movements: Extraocular movements intact.     Pupils: Pupils are equal, round, and reactive to light.  Neck:     Thyroid: No thyromegaly.     Vascular: No hepatojugular reflux.  Cardiovascular:     Rate and Rhythm: Tachycardia present. Rhythm irregular.     Heart sounds: No murmur heard.  No friction rub.  Pulmonary:     Breath sounds: Normal breath sounds.  Chest:     Chest wall: No deformity or tenderness.    Abdominal:     Palpations: Abdomen is soft.     Tenderness: There is no abdominal tenderness.  Musculoskeletal:        General: Normal range of motion.     Right lower leg: No tenderness. No edema.     Left lower leg: No tenderness. No edema.  Skin:    General: Skin is warm and dry.     Findings: No ecchymosis, erythema or rash.  Neurological:     General: No focal deficit present.     Mental Status: She is alert and oriented to person, place, and time.     Cranial Nerves: Facial asymmetry (Pulse to the left slightly at the lower cheek) present.     Sensory: Sensation is intact.     Motor: No weakness or abnormal muscle tone.     Coordination: Coordination is intact. Finger-Nose-Finger Test normal.     Comments: Patient had inspected strength for both lower extremities for resisted extension of the hip while on the gurney.  She was able to sit up on her own.  She understood directions and followed them normally.  She did have some trouble with word finding.  Particularly when I asked her to describe the pain and sensation in the left arm some of her speech was garbled and the words disjointed.  As long as she was able to answer questions with 1 or 2 The answers were clear and appropriate    as result of the rapid heart rate and the acute nature of the situation plus moderately elevated blood pressure and lack of IV access, she was given 0.1 mg of clonidine and asked to chew that up and swallow it.  Subsequently EMS arrived the patient was transferred to their gurney and rapidly moved to their ambulance to be transferred to [facility] emergency department.    Assessment & Plan:   Sheila Ray was seen today for shortness of breath and left arm pain.  Diagnoses and all orders for this visit:  SOB (shortness of breath) -     EKG 12-Lead -     cloNIDine (CATAPRES) tablet 0.1 mg       I am having Sheila Ray maintain her Vitamin H21, OneTouch Delica Lancets 22Q, Xarelto,  alendronate, lisinopril, diltiazem, metFORMIN, glucose blood, and metoprolol succinate. We administered cloNIDine.  Allergies as of 05/03/2020      Reactions   Crestor [rosuvastatin]    Cramps   Lipitor [atorvastatin]    Cramps   Pravastatin Other (See Comments)   Cramps    Keflex [cephalexin] Hives      Medication List       Accurate as of May 03, 2020 12:41 PM. If you have any questions, ask your nurse or doctor.        alendronate 70 MG tablet Commonly known as: FOSAMAX TAKE 1  TABLET BY MOUTH  WEEKLY AS DIRECTED   diltiazem 240 MG 24 hr capsule Commonly known as: CARDIZEM CD TAKE 1 CAPSULE BY MOUTH  DAILY   glucose blood test strip Commonly known as: ONE TOUCH ULTRA TEST USE TO CHECK BLOOD GLUCOSE  ONCE A DAY AS INSTRUCTED   lisinopril 40 MG tablet Commonly known as: ZESTRIL TAKE 1 TABLET BY MOUTH  DAILY   metFORMIN 1000 MG tablet Commonly known as: GLUCOPHAGE Take 0.5 tablets (500 mg total) by mouth daily with breakfast.   metoprolol succinate 50 MG 24 hr tablet Commonly known as: TOPROL-XL TAKE 1 TABLET BY MOUTH  DAILY WITH OR IMMEDIATELY  FOLLOWING A MEAL   OneTouch Delica Lancets 10Y Misc 1 each by Does not apply route daily. Use to check BG daily   Vitamin B12 1000 MCG Tbcr Take by mouth.   Xarelto 20 MG Tabs tablet Generic drug: rivaroxaban TAKE 1 TABLET BY MOUTH  DAILY        Follow-up: Return if symptoms worsen or fail to improve.  Claretta Fraise, M.D.

## 2020-05-04 ENCOUNTER — Inpatient Hospital Stay (HOSPITAL_COMMUNITY): Payer: Medicare Other

## 2020-05-04 ENCOUNTER — Encounter (HOSPITAL_COMMUNITY): Payer: Self-pay | Admitting: Radiology

## 2020-05-04 DIAGNOSIS — I361 Nonrheumatic tricuspid (valve) insufficiency: Secondary | ICD-10-CM | POA: Diagnosis not present

## 2020-05-04 DIAGNOSIS — I6389 Other cerebral infarction: Secondary | ICD-10-CM

## 2020-05-04 DIAGNOSIS — I340 Nonrheumatic mitral (valve) insufficiency: Secondary | ICD-10-CM | POA: Diagnosis not present

## 2020-05-04 DIAGNOSIS — I6602 Occlusion and stenosis of left middle cerebral artery: Secondary | ICD-10-CM

## 2020-05-04 DIAGNOSIS — R0902 Hypoxemia: Secondary | ICD-10-CM | POA: Diagnosis not present

## 2020-05-04 LAB — MAGNESIUM: Magnesium: 1.7 mg/dL (ref 1.7–2.4)

## 2020-05-04 LAB — GLUCOSE, CAPILLARY
Glucose-Capillary: 171 mg/dL — ABNORMAL HIGH (ref 70–99)
Glucose-Capillary: 164 mg/dL — ABNORMAL HIGH (ref 70–99)
Glucose-Capillary: 134 mg/dL — ABNORMAL HIGH (ref 70–99)
Glucose-Capillary: 110 mg/dL — ABNORMAL HIGH (ref 70–99)
Glucose-Capillary: 121 mg/dL — ABNORMAL HIGH (ref 70–99)
Glucose-Capillary: 145 mg/dL — ABNORMAL HIGH (ref 70–99)

## 2020-05-04 LAB — CBC WITH DIFFERENTIAL/PLATELET
Abs Immature Granulocytes: 0.09 10*3/uL — ABNORMAL HIGH (ref 0.00–0.07)
Basophils Absolute: 0 10*3/uL (ref 0.0–0.1)
Basophils Relative: 0 %
Eosinophils Absolute: 0 10*3/uL (ref 0.0–0.5)
Eosinophils Relative: 0 %
HCT: 37.4 % (ref 36.0–46.0)
Hemoglobin: 10.9 g/dL — ABNORMAL LOW (ref 12.0–15.0)
Immature Granulocytes: 1 %
Lymphocytes Relative: 4 %
Lymphs Abs: 0.8 10*3/uL (ref 0.7–4.0)
MCH: 24 pg — ABNORMAL LOW (ref 26.0–34.0)
MCHC: 29.1 g/dL — ABNORMAL LOW (ref 30.0–36.0)
MCV: 82.4 fL (ref 80.0–100.0)
Monocytes Absolute: 0.8 10*3/uL (ref 0.1–1.0)
Monocytes Relative: 4 %
Neutro Abs: 17.5 10*3/uL — ABNORMAL HIGH (ref 1.7–7.7)
Neutrophils Relative %: 91 %
Platelets: 407 10*3/uL — ABNORMAL HIGH (ref 150–400)
RBC: 4.54 MIL/uL (ref 3.87–5.11)
RDW: 17.2 % — ABNORMAL HIGH (ref 11.5–15.5)
WBC: 19.2 10*3/uL — ABNORMAL HIGH (ref 4.0–10.5)
nRBC: 0 % (ref 0.0–0.2)

## 2020-05-04 LAB — LIPID PANEL
Cholesterol: 173 mg/dL (ref 0–200)
HDL: 36 mg/dL — ABNORMAL LOW (ref >40)
LDL Cholesterol: 124 mg/dL — ABNORMAL HIGH (ref 0–99)
Total CHOL/HDL Ratio: 4.8 RATIO
Triglycerides: 65 mg/dL (ref <150)
VLDL: 13 mg/dL (ref 0–40)

## 2020-05-04 LAB — I-STAT CHEM 8, ED
BUN: 10 mg/dL (ref 8–23)
Calcium, Ion: 0.88 mmol/L — CL (ref 1.15–1.40)
Chloride: 108 mmol/L (ref 98–111)
Creatinine, Ser: 0.5 mg/dL (ref 0.44–1.00)
Glucose, Bld: 132 mg/dL — ABNORMAL HIGH (ref 70–99)
HCT: 37 % (ref 36.0–46.0)
Hemoglobin: 12.6 g/dL (ref 12.0–15.0)
Potassium: 4.1 mmol/L (ref 3.5–5.1)
Sodium: 134 mmol/L — ABNORMAL LOW (ref 135–145)
TCO2: 18 mmol/L — ABNORMAL LOW (ref 22–32)

## 2020-05-04 LAB — BASIC METABOLIC PANEL
Anion gap: 12 (ref 5–15)
BUN: 12 mg/dL (ref 8–23)
CO2: 17 mmol/L — ABNORMAL LOW (ref 22–32)
Calcium: 8.8 mg/dL — ABNORMAL LOW (ref 8.9–10.3)
Chloride: 107 mmol/L (ref 98–111)
Creatinine, Ser: 0.82 mg/dL (ref 0.44–1.00)
GFR, Estimated: >60 mL/min (ref >60)
Glucose, Bld: 194 mg/dL — ABNORMAL HIGH (ref 70–99)
Potassium: 4.2 mmol/L (ref 3.5–5.1)
Sodium: 136 mmol/L (ref 135–145)

## 2020-05-04 LAB — TSH: TSH: 1.846 u[IU]/mL (ref 0.350–4.500)

## 2020-05-04 LAB — ECHOCARDIOGRAM COMPLETE
Area-P 1/2: 3.77 cm2
S' Lateral: 2.8 cm

## 2020-05-04 LAB — HEMOGLOBIN A1C
Hgb A1c MFr Bld: 6.4 % — ABNORMAL HIGH (ref 4.8–5.6)
Mean Plasma Glucose: 136.98 mg/dL

## 2020-05-04 IMAGING — MR MR HEAD W/O CM
4 of 5 series · 28 of 48 positions shown · non-contrast
Comparison: CT/CT angiogram of the head and neck [DATE].

CLINICAL DATA: Stroke, follow-up.

EXAM:
MRI HEAD WITHOUT CONTRAST
MRA HEAD WITHOUT CONTRAST
TECHNIQUE: Multiplanar, multiecho pulse sequences of the brain and surrounding
structures were obtained without intravenous contrast. Angiographic
images of the head were obtained using MRA technique without
contrast.

[Series 5: DWI · axial · 3.0mm · 0.88mm/px · z∈[-77,+62]mm · 11 of 96 slices shown (1 of 4)]
[im 1/96]
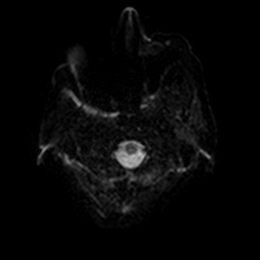
[im 10/96]
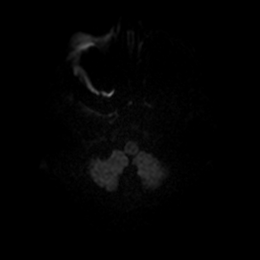
[im 20/96]
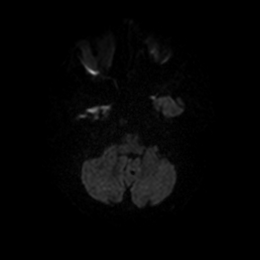
[im 29/96]
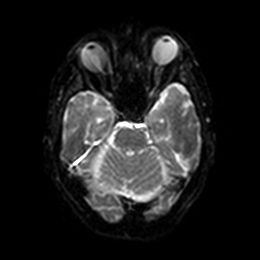
[im 39/96]
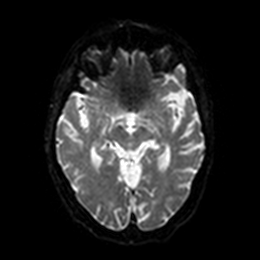
[im 48/96]
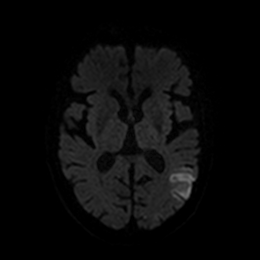
[im 58/96]
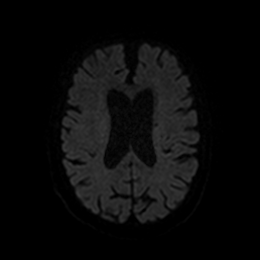
[im 67/96]
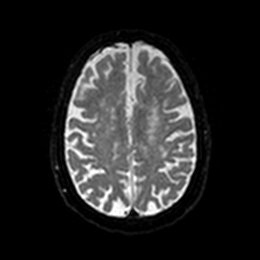
[im 77/96]
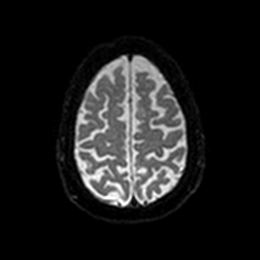
[im 86/96]
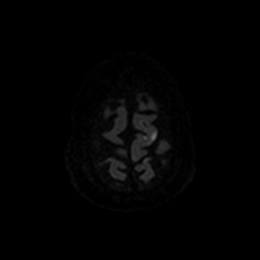
[im 96/96]
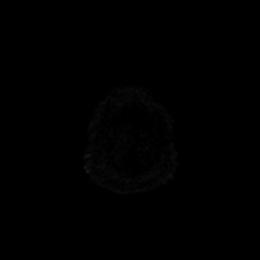

[Series 6: DWI · axial · 3.0mm · 0.88mm/px · z∈[-77,+62]mm · 5 of 48 slices shown (2 of 4)]
[im 1/48]
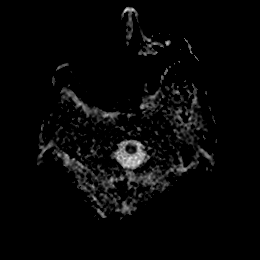
[im 12/48]
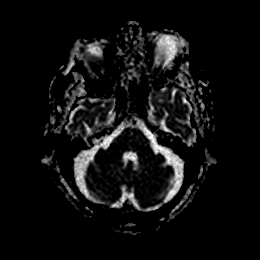
[im 24/48]
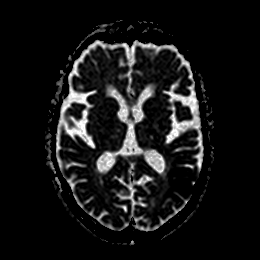
[im 36/48]
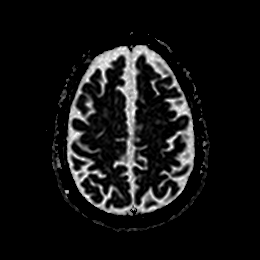
[im 48/48]
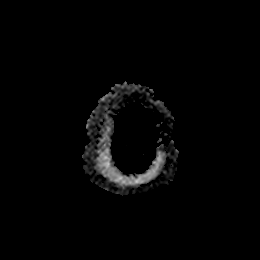

[Series 7: DWI · coronal · 4.0mm · 0.88mm/px · 8 of 72 slices shown (3 of 4)]
[im 1/72]
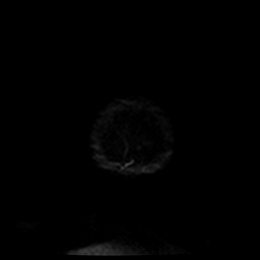
[im 11/72]
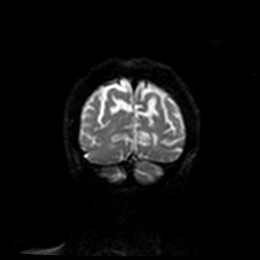
[im 21/72]
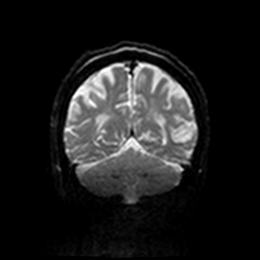
[im 31/72]
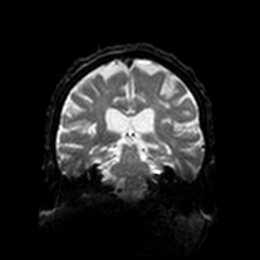
[im 41/72]
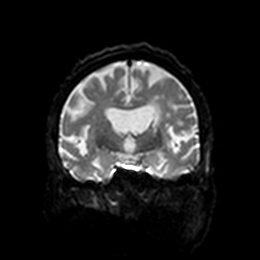
[im 51/72]
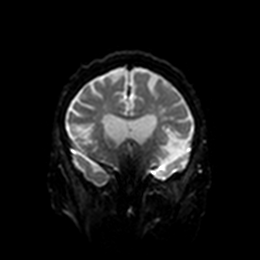
[im 61/72]
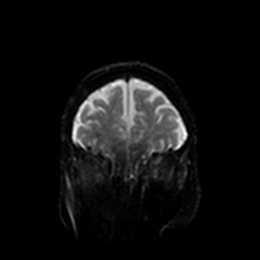
[im 72/72]
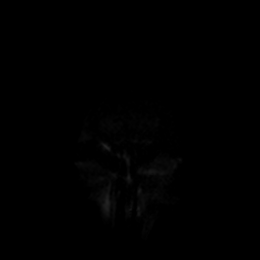

[Series 8: DWI · coronal · 4.0mm · 0.88mm/px · 4 of 35 slices shown (4 of 4)]
[im 1/35]
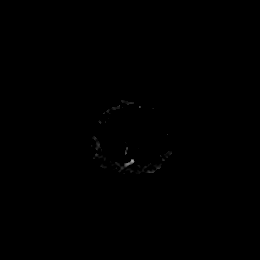
[im 12/35]
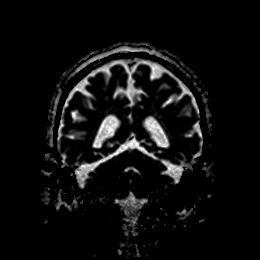
[im 23/35]
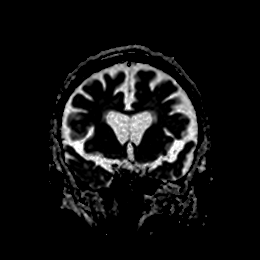
[im 35/35]
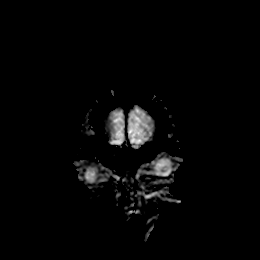

[28 of 48 positions shown; findings below may reference images not displayed]

FINDINGS: MRI HEAD FINDINGS

Brain: Cortical restricted diffusion is seen within the left
temporal occipital region. Scattered foci of restricted diffusion
are also seen in the left frontoparietal and insular regions and
left amygdala. Focus of susceptibility artifact in the left temporal
region may represent residual clot in a distal branch versus small
focus of hemorrhage. No hemorrhagic transformation, significant mass
effect or midline shift. No hydrocephalus or mass lesion.

Scattered and confluent foci of T2 hyperintensity are seen within
the white matter of the cerebral hemispheres, nonspecific, most
likely related to chronic small vessel ischemia. Remote lacunar
infarct in the left corona radiata. Moderate parenchymal volume
loss.

Vascular: Normal flow voids.

Skull and upper cervical spine: Normal marrow signal.

Sinuses/Orbits: Mild mucosal thickening of the ethmoid cells. The
orbits are maintained.

MRA HEAD FINDINGS

The study is partially degraded by motion.

Normal flow related enhancement is seen within the upper cervical
and intracranial segment of the bilateral ICAs. Moderate stenosis of
the right carotid terminus is likely artifactual given no stenosis
on recent CTA. The bilateral M1-M2/M segments and bilateral
A1-A2/ACA segments have normal flow related enhancement.

Hypoplastic left vertebral artery with a dominant right vertebral
artery with normal flow related enhancement. The basilar artery and
bilateral posterior cerebral arteries are also patent. Luminal
irregularity along the bilateral PCAs is suggestive of intracranial
atherosclerotic disease.

No proximal occlusion or stenosis, aneurysm or vascular
malformation.
IMPRESSION: 1. Cortical restricted diffusion within the left temporal occipital
region. Scattered foci of restricted diffusion are also seen in the
left frontoparietal and insular regions and left amygdala. Findings
are consistent with acute infarcts. No hemorrhagic transformation,
significant mass effect or midline shift.
2. No proximal occlusion or stenosis in the anterior or posterior
circulation.

Results communicated to Dr. JUMPER via amion pager system.

## 2020-05-04 MED ORDER — SODIUM CHLORIDE 0.9 % IV BOLUS
500.0000 mL | Freq: Once | INTRAVENOUS | Status: AC
  Administered 2020-05-04: 500 mL via INTRAVENOUS

## 2020-05-04 MED ORDER — AMIODARONE HCL IN DEXTROSE 360-4.14 MG/200ML-% IV SOLN
60.0000 mg/h | INTRAVENOUS | Status: AC
  Administered 2020-05-04 (×2): 60 mg/h via INTRAVENOUS
  Filled 2020-05-04 (×2): qty 200

## 2020-05-04 MED ORDER — AMIODARONE LOAD VIA INFUSION
150.0000 mg | Freq: Once | INTRAVENOUS | Status: AC
  Administered 2020-05-04: 150 mg via INTRAVENOUS
  Filled 2020-05-04: qty 83.34

## 2020-05-04 MED ORDER — ASPIRIN 300 MG RE SUPP
300.0000 mg | Freq: Every day | RECTAL | Status: DC
  Administered 2020-05-04: 300 mg via RECTAL
  Filled 2020-05-04: qty 1

## 2020-05-04 MED ORDER — MAGNESIUM SULFATE 2 GM/50ML IV SOLN
2.0000 g | Freq: Once | INTRAVENOUS | Status: AC
  Administered 2020-05-04: 2 g via INTRAVENOUS
  Filled 2020-05-04: qty 50

## 2020-05-04 MED ORDER — CHLORHEXIDINE GLUCONATE CLOTH 2 % EX PADS
6.0000 | MEDICATED_PAD | Freq: Every day | CUTANEOUS | Status: DC
  Administered 2020-05-04 – 2020-05-06 (×3): 6 via TOPICAL

## 2020-05-04 MED ORDER — AMIODARONE HCL IN DEXTROSE 360-4.14 MG/200ML-% IV SOLN
30.0000 mg/h | INTRAVENOUS | Status: DC
  Administered 2020-05-04 – 2020-05-06 (×3): 30 mg/h via INTRAVENOUS
  Filled 2020-05-04 (×4): qty 200

## 2020-05-04 MED ORDER — PERFLUTREN LIPID MICROSPHERE
1.0000 mL | INTRAVENOUS | Status: AC | PRN
  Administered 2020-05-04: 2 mL via INTRAVENOUS
  Filled 2020-05-04: qty 10

## 2020-05-04 MED ORDER — EZETIMIBE 10 MG PO TABS
10.0000 mg | ORAL_TABLET | Freq: Every day | ORAL | Status: DC
  Administered 2020-05-04 – 2020-05-15 (×12): 10 mg via ORAL
  Filled 2020-05-04 (×12): qty 1

## 2020-05-04 MED ORDER — ASPIRIN EC 325 MG PO TBEC
325.0000 mg | DELAYED_RELEASE_TABLET | Freq: Every day | ORAL | Status: DC
  Administered 2020-05-05 – 2020-05-08 (×4): 325 mg via ORAL
  Filled 2020-05-04 (×4): qty 1

## 2020-05-04 NOTE — Evaluation (Signed)
Clinical/Bedside Swallow Evaluation Patient Details  Name: Sheila Ray MRN: [id number] Date of Birth: 17-Aug-1940  Today's Date: 05/04/2020 Time: SLP Start Time (ACUTE ONLY): 1640 SLP Stop Time (ACUTE ONLY): 3220 SLP Time Calculation (min) (ACUTE ONLY): 13 min  Past Medical History:  Past Medical History:  Diagnosis Date  . Arthritis   . Atrial fibrillation ([place])   . Complication of anesthesia   . Diabetes mellitus without complication ([place])   . Diverticulosis   . Fatigue   . Gastric polyp   . Goiter   . Hyperlipidemia   . Hypertension   . Hypothyroid    taken off of thyroid medication 2012014   . Obese   . Osteopenia   . PONV (postoperative nausea and vomiting)   . Postmenopausal   . Rosacea   . Stroke ([place]) 01/15/2013   left thalamic  stroke, small vessel  . Vitamin D deficiency    Past Surgical History:  Past Surgical History:  Procedure Laterality Date  . ABDOMINAL HYSTERECTOMY  1985  . FOOT SURGERY Right 08/2002  . RADIOLOGY WITH ANESTHESIA N/A 05/03/2020   Procedure: IR WITH ANESTHESIA;  Surgeon: Radiologist, Medication, MD;  Location: [facility];  Service: Radiology;  Laterality: N/A;  . right knee arthroscopy   2013   . TONSILLECTOMY    . 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   Procedure: RIGHT TOTAL KNEE ARTHROPLASTY;  Surgeon: Gearlean Alf, MD;  Location: [facility];  Service: Orthopedics;  Laterality: Right;   HPI:  Pt is a 79 y.o. female who has a PMH including but not limited to A.fib on xarelto, HTN, HLD, DM, hypothyroidism, prior left thalamic stroke with residual deficits.  She presented to the ED 11/3 with confusion, aphasia, right facial droop, right leg weakness.  She was found to have Left M2/M3 occlusion with penumbra of 41ml.  She was taken to IR and had right common carotid arteriogram followed by revascularization of occluded superior division M2 branch and partial revascularization of the distal M3 branch with mechanical thrombolysis.  Post CT brain  showed no ICH or mass effect. She was intubated 11/3 for procedure. EEg: cortical dysfunction in left hemisphere, maximal left temporal region consistent with underlying stroke. No seizures.    Assessment / Plan / Recommendation Clinical Impression  Pt was seen for bedside swallow evaluation with her son present. Pt was unable to provide history or consistently follow commands secondary to aphasia. However, pt's son denied the pt having a history of oropharyngeal dysphagia at baseline and stated that she consumes a regular texture diet. Oral mechanism exam was limited due to pt's difficulty following commands; however, right-sided facial weakness was noted. Dentition was adequate. She tolerated all solids and liquids without signs or symptoms of oropharyngeal dysphagia and was able to consistently use lingual movements to remove dried secretions which became moistened with trials. A regular texture diet with thin liquids is recommended at this time and SLP will see pt once more to ensure diet tolerance.  SLP Visit Diagnosis: Dysphagia, unspecified (R13.10)    Aspiration Risk       Diet Recommendation Regular;Thin liquid   Liquid Administration via: Cup;Straw Medication Administration: Whole meds with puree Supervision: Staff to assist with self feeding Compensations: Minimize environmental distractions Postural Changes: Seated upright at 90 degrees    Other  Recommendations Oral Care Recommendations: Oral care BID   Follow up Recommendations  (Continued SLP services at level of care recommended by PT/OT)      Frequency and Duration  min 2x/week  2 weeks       Prognosis Barriers to Reach Goals: Language deficits      Swallow Study   General Date of Onset: 05/03/20 HPI: Pt is a 79 y.o. female who has a PMH including but not limited to A.fib on xarelto, HTN, HLD, DM, hypothyroidism, prior left thalamic stroke with residual deficits.  She presented to the ED 11/3 with confusion, aphasia,  right facial droop, right leg weakness.  She was found to have Left M2/M3 occlusion with penumbra of 77ml.  She was taken to IR and had right common carotid arteriogram followed by revascularization of occluded superior division M2 branch and partial revascularization of the distal M3 branch with mechanical thrombolysis.  Post CT brain showed no ICH or mass effect. She was intubated 11/3 for procedure. EEg: cortical dysfunction in left hemisphere, maximal left temporal region consistent with underlying stroke. No seizures.  Type of Study: Bedside Swallow Evaluation Previous Swallow Assessment: None Diet Prior to this Study: NPO Temperature Spikes Noted: No Respiratory Status: Room air History of Recent Intubation: Yes Length of Intubations (days):  (for procedure) Date extubated: 05/03/20 Behavior/Cognition: Alert;Cooperative;Pleasant mood Oral Cavity Assessment: Within Functional Limits Oral Care Completed by SLP: No Oral Cavity - Dentition: Adequate natural dentition Vision: Functional for self-feeding Self-Feeding Abilities: Able to feed self Patient Positioning: Upright in bed;Postural control adequate for testing Baseline Vocal Quality: Normal Volitional Cough: Cognitively unable to elicit Volitional Swallow: Able to elicit    Oral/Motor/Sensory Function Overall Oral Motor/Sensory Function: Other (comment) (Pt unable to follow all commands but mild R facial weakness)   Ice Chips Ice chips: Within functional limits Presentation: Spoon   Thin Liquid Thin Liquid: Within functional limits Presentation: Straw    Nectar Thick Nectar Thick Liquid: Not tested   Honey Thick Honey Thick Liquid: Not tested   Puree Puree: Within functional limits Presentation: Spoon   Solid     Solid: Within functional limits Presentation: [place] I. Hardin Negus, [place], [place] Office number [phone] Pager [place] 05/04/2020,5:23  PM

## 2020-05-04 NOTE — Progress Notes (Signed)
CODE STROKE CT Times 1233 call time 122 beeper time 1255 exam started 1256 exam finished 8184 images sent to [facility] 1302 exam completed in EPIC 1302 [facility] radiology called

## 2020-05-04 NOTE — Progress Notes (Signed)
PT Cancellation Note  Patient Details Name: Sheila Ray MRN: [id number] DOB: 16-Nov-1940   Cancelled Treatment:    Reason Eval/Treat Not Completed: Patient not medically ready. PT will continue to follow and evaluate when appropriate.   Hardie Pulley, DPT   Acute Rehabilitation Department Pager #: [phone]   Otho Bellows 05/04/2020, 10:12 AM

## 2020-05-04 NOTE — Procedures (Signed)
Patient Name: Sheila Ray  MRN: [id number]  Epilepsy Attending: Lora Havens  Referring Physician/Provider: Dr Kerney Elbe Date: 05/04/2020 Duration: 23.54 mins  Patient history: 79 year old female with left MCA stroke s/p revascularization, with AMS. EEG to evaluate for seizure.   Level of alertness: Awake  AEDs during EEG study: None  Technical aspects: This EEG study was done with scalp electrodes positioned according to the 10-20 International system of electrode placement. Electrical activity was acquired at a sampling rate of 500Hz  and reviewed with a high frequency filter of 70Hz  and a low frequency filter of 1Hz . EEG data were recorded continuously and digitally stored.   Description: The posterior dominant rhythm consists of 9 Hz activity of moderate voltage (25-35 uV) seen predominantly in posterior head regions, asymmetric ( L<R) and reactive to eye opening and eye closing.  EEG showed continuous 3 to 5 Hz theta-delta slowing.  Left hemisphere, maximal left temporal region which at times appeared sharply contoured.  Hyperventilation and photic stimulation were not performed.     ABNORMALITY -Continuous slow, lateralized left hemisphere, maximal left temporal region -Background asymmetry, left<right  IMPRESSION: This study is suggestive of cortical dysfunction in left hemisphere, maximal left temporal region consistent with underlying stroke.  No seizures or definite epileptiform discharges were seen throughout the recording.  If suspicion for interictal activity remains a concern, a prolonged study can be considered.   Jimel Myler Barbra Sarks

## 2020-05-04 NOTE — Evaluation (Signed)
Speech Language Pathology Evaluation Patient Details Name: Sheila Ray MRN: [id number] DOB: Nov 06, 1940 Today's Date: 05/04/2020 Time: 4401-0272 SLP Time Calculation (min) (ACUTE ONLY): 16 min  Problem List:  Patient Active Problem List   Diagnosis Date Noted  . Acute ischemic left MCA stroke (HCC) 05/03/2020  . 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 Hypoxia   . Encephalopathy acute   . Atrial fibrillation (HCC) 08/19/2016  . OA (osteoarthritis) of knee 07/25/2014  . Hypothyroid 03/04/2013  . Stroke (HCC) 01/15/2013  . Osteoporosis 12/02/2012  . DM (diabetes mellitus) (HCC) 10/22/2012  . Hypertension associated with diabetes (HCC) 10/22/2012  . Hyperlipidemia associated with type 2 diabetes mellitus (HCC) 10/22/2012  . Vitamin D deficiency 09/13/2012  . Obesity (BMI 30-39.9) 09/13/2012   Past Medical History:  Past Medical History:  Diagnosis Date  . Arthritis   . Atrial fibrillation (HCC)   . Complication of anesthesia   . Diabetes mellitus without complication (HCC)   . Diverticulosis   . Fatigue   . Gastric polyp   . Goiter   . Hyperlipidemia   . Hypertension   . Hypothyroid    taken off of thyroid medication 2012014   . Obese   . Osteopenia   . PONV (postoperative nausea and vomiting)   . Postmenopausal   . Rosacea   . 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 Vitamin D deficiency    Past Surgical History:  Past Surgical History:  Procedure Laterality Date  . ABDOMINAL HYSTERECTOMY  1985  . FOOT SURGERY Right 08/2002  . RADIOLOGY WITH ANESTHESIA N/A 05/03/2020   Procedure: IR WITH ANESTHESIA;  Surgeon: Radiologist, Medication, MD;  Location: [facility];  Service: Radiology;  Laterality: N/A;  . right knee arthroscopy   2013   . TONSILLECTOMY    . 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   Procedure: RIGHT TOTAL KNEE ARTHROPLASTY;  Surgeon: Loanne Drilling, MD;  Location: [facility];  Service: Orthopedics;  Laterality: Right;    HPI:  Pt is a 79 y.o. female who has a PMH including but not limited to A.fib on xarelto, HTN, HLD, DM, hypothyroidism, prior left thalamic stroke with residual deficits.  She presented to the ED 11/3 with confusion, aphasia, right facial droop, right leg weakness.  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  She was taken to IR and had right common carotid arteriogram followed by revascularization of occluded superior division M2 branch and partial revascularization of the distal M3 branch with mechanical thrombolysis.  Post CT brain showed no ICH or mass effect. She was intubated 11/3 for procedure. EEg: cortical dysfunction in left hemisphere, maximal left temporal region consistent with underlying stroke. No seizures.    Assessment / Plan / Recommendation Clinical Impression  Pt's son denied the pt having any speech/language/cognitive-linguistic impairments at baseline and indicated that she was living alone prior to admission. She presents with moderate to severe aphasia characterized by impairments in receptive and expressive language with more notably deficits in expressive language. Verbal output was limited to spontaneous production of "yes" and "alright" during the evaluation. She was able to complete verbally-presented sentences, but exhibited difficulty with naming, and repetition. Perseveration was noted throughout the evaluation and phonemic paraphasias were inconsistently noted. Receptively, she was able to inconsistently follow 1-step commands but only responded "yes" to questions and demonstrated difficulty with more complex auditory comprehension. Skilled SLP services are clinically indicated at this time to improve language  skills.     SLP Assessment  SLP Recommendation/Assessment: Patient needs continued Speech Lanaguage Pathology Services SLP Visit Diagnosis: Aphasia (R47.01)    Follow Up Recommendations   (Continued SLP services at level of care  recommended by PT/OT)    Frequency and Duration min 2x/week  2 weeks      SLP Evaluation Cognition  Overall Cognitive Status: Difficult to assess (Due to aphasia) Orientation Level: Oriented to person;Oriented to place;Disoriented to time;Disoriented to situation       Comprehension  Auditory Comprehension Overall Auditory Comprehension: Impaired Yes/No Questions: Impaired Basic Immediate Environment Questions:  (2/4) Complex Questions:  (2/5) Commands: Impaired One Step Basic Commands:  (3/5) Two Step Basic Commands:  (0/3) Multistep Basic Commands:  (0/2) Conversation: Simple Reading Comprehension Reading Status: Not tested    Expression Expression Primary Mode of Expression: Verbal Verbal Expression Overall Verbal Expression: Impaired Automatic Speech: Counting;Day of week;Month of year (0% accuracy) Level of Generative/Spontaneous Verbalization: Word Repetition: Impaired Level of Impairment: Phrase level (Word 2/3; phrase 0/4) Naming: Impairment Responsive: Not tested Confrontation: Impaired (0/5) Convergent:  (Sentence completion: 3/5) Verbal Errors: Perseveration;Phonemic paraphasias Pragmatics: No impairment Written Expression Dominant Hand: Right   Oral / Motor  Oral Motor/Sensory Function Overall Oral Motor/Sensory Function: Other (comment) (Pt unable to follow all commands but mild R facial weakness) Motor Speech Overall Motor Speech: Appears within functional limits for tasks assessed Respiration: Within functional limits Phonation: Normal Resonance: Within functional limits Intelligibility: Intelligible   Landon Bassford I. Vear Clock, MS, CCC-SLP Acute Rehabilitation Services Office number [phone] Pager [phone]                    Scheryl Marten 05/04/2020, 5:46 PM

## 2020-05-04 NOTE — Progress Notes (Signed)
NAME:  Sheila Ray, MRN:  [id number], DOB:  18-Aug-1940, LOS: 1 ADMISSION DATE:  05/03/2020, CONSULTATION DATE:  05/03/20 REFERRING MD:  [place]  CHIEF COMPLAINT:  A.fib RVR   Brief History   Sheila Ray is a 79 y.o. female who was admitted 11/3 with left M2/M3 occlusion s/p neuro IR revascularization.  Extubated post procedure then had A.fib with RVR (has underlying A.fib); therefore, PCCM asked to assist.  History of present illness   Pt is encephelopathic; therefore, this HPI is obtained from chart review Sheila Ray is a 79 y.o. female who has a PMH including but not limited to A.fib on xarelto, HTN, HLD, DM, hypothyroidism, prior left thalamic stroke with residual deficits (see "past medical history" for rest).  She presented to [facility] ED 11/3 with confusion, aphasia, right facial droop, right leg weakness.  She was found to have Left M2/M3 occlusion with penumbra of 32ml.  She was taken to IR and had right common carotid arteriogram followed by revascularization of occluded superior division M2 branch and partial revascularization of the distal M3 branch with mechanical thrombolysis.  Post CT brain showed no ICH or mass effect.  She was extubated and was transferred to the neuro ICU.  PCCM was asked to assist with management of her A.fib with RVR.  Past Medical History  has Vitamin D deficiency; Obesity (BMI 30-39.9); DM (diabetes mellitus) ([place]); Hypertension associated with diabetes ([place]); Hyperlipidemia associated with type 2 diabetes mellitus ([place]); Osteoporosis; Stroke [facility]); Hypothyroid; OA (osteoarthritis) of knee; Atrial fibrillation ([place]); Acute ischemic left MCA stroke ([place]); Middle cerebral artery embolism, left; Hypoxia; and Encephalopathy acute on their problem list.  [facility] Events   11/3 > admit.  Consults:  Neuro, PCCM.  Procedures:  ETT 11/3 > 11/3. L radial art line 11/3 >   Significant Diagnostic Tests:  CT / CTA head 11/3 > no hemorrhage or acute  infarction.  Occlusion of left M2 MCA branch. CTP brain 11/3 > no core infarction.  20ml penumbra in posterior left MCA territory. CT head 11/3 >  MRI brain 11/4 >  Echo 11/4 >   Micro Data:  Flu 11/3 > neg. RSV 11/3 > neg. COVID 11/3 > neg.  Antimicrobials:  None.   Interim history/subjective:  Awake, NAD.  Flaccid R arm. Nods to questions, but confused.  Answers "yes" to all questions.  Remains on Neo gtt   Objective:  Blood pressure (!) 131/91, pulse (!) 48, temperature 98.1 F (36.7 C), resp. rate 19, SpO2 98 %.        Intake/Output Summary (Last 24 hours) at 05/04/2020 0931 Last data filed at 05/04/2020 0900 Gross per 24 hour  Intake 3426.55 ml  Output 810 ml  Net 2616.55 ml   There were no vitals filed for this visit.  Examination: General: Adult female, in NAD. Neuro: Awake but not following all commands.  Able to hold left arm up against gravity.  Wiggles toes.  R arm flaccid. Appears aphasic. Nods to questions but all answers are yes  HEENT: /AT. Sclerae anicteric.  EOMI. Cardiovascular: RRR, no M/R/G.  Lungs: Respirations even and unlabored on simple mask. CTA bilaterally, No W/R/R. Protecting airway. Some abd breathing but appears comfortable overall  Abdomen: BS x 4, soft, NT/ND.  Musculoskeletal: No gross deformities, no edema.  Skin: Intact, warm, no rashes.  Assessment & Plan:   Left M2/M3 occlusion - s/p right common carotid arteriogram followed by revascularization of occluded superior division M2 branch and partial  revascularization of the distal M3 branch with mechanical thrombolysis. PLAN -  - Post procedure management per IR. - Stroke workup / management per neuro. - F/u on CT head, MRI brain, echo.   Hx HTN, HLD. PLAN -  - holding home toprol with hypotension. - Hold home lisinopril, diltiazem until can safely take PO then can consider resuming.  Hx A.fib (on xarelto) - now with RVR - improved on amio gtt  PLAN -  - continue amiodarone  gtt  - Hold home xarelto. - Defer timing of anticoagulation to neuro.  Hypotension  PLAN -  Continue neo gtt - wean as able to keep SBP 120-140  Hx hypothyroidism. - TSH wnl - no rx seen on home meds   Hx DM.  - SSI. - Hold home metformin.   Best Practice:  Diet: NPO. Pain/Anxiety/Delirium protocol (if indicated): N/A. VAP protocol (if indicated): N/A. DVT prophylaxis: SCD's / Heparin. GI prophylaxis: N/A. Glucose control: SSI. Mobility: Bedrest. Code Status: Full. Family Communication: Son updated at bedside. Disposition: ICU.  Labs   CBC: Recent Labs  Lab 05/03/20 1254 05/03/20 1259 05/03/20 1320 05/03/20 1813 05/04/20 0317  WBC 13.4*  --   --  15.6* 19.2*  NEUTROABS 10.9*  --   --   --  17.5*  HGB 11.5* 12.6 11.2* 10.6* 10.9*  HCT 37.6 37.0 33.0* 36.0 37.4  MCV 77.8*  --   --  78.6* 82.4  PLT 338  --   --  414* 817*   Basic Metabolic Panel: Recent Labs  Lab 05/03/20 1259 05/03/20 1317 05/03/20 1320 05/04/20 0317 05/04/20 0442  NA 134* 131* 136 136  --   K 4.1 3.7 3.8 4.2  --   CL 108 102 101 107  --   CO2  --  22  --  17*  --   GLUCOSE 132* 132* 127* 194*  --   BUN 10 11 9 12   --   CREATININE 0.50 0.64 0.60 0.82  --   CALCIUM  --  8.6*  --  8.8*  --   MG  --   --   --   --  1.7   GFR: Estimated Creatinine Clearance: 57.3 mL/min (by C-G formula based on SCr of 0.82 mg/dL). Recent Labs  Lab 05/03/20 1254 05/03/20 1813 05/04/20 0317  WBC 13.4* 15.6* 19.2*   Liver Function Tests: Recent Labs  Lab 05/03/20 1317  AST 15  ALT 12  ALKPHOS 52  BILITOT 0.5  PROT 5.7*  ALBUMIN 3.0*   No results for input(s): LIPASE, AMYLASE in the last 168 hours. No results for input(s): AMMONIA in the last 168 hours. ABG    Component Value Date/Time   TCO2 22 05/03/2020 1320    Coagulation Profile: Recent Labs  Lab 05/03/20 1317  INR 1.2   Cardiac Enzymes: No results for input(s): CKTOTAL, CKMB, CKMBINDEX, TROPONINI in the last 168  hours. HbA1C: HB A1C (BAYER DCA - WAIVED)  Date/Time Value Ref Range Status  03/12/2019 09:29 AM 6.4 <7.0 % Final    Comment:                                          Diabetic Adult            <7.0  Healthy Adult        4.3 - 5.7                                                           (DCCT/NGSP) American Diabetes Association's Summary of Glycemic Recommendations for Adults with Diabetes: Hemoglobin A1c <7.0%. More stringent glycemic goals (A1c <6.0%) may further reduce complications at the cost of increased risk of hypoglycemia.   10/29/2018 08:42 AM 6.6 <7.0 % Final    Comment:                                          Diabetic Adult            <7.0                                       Healthy Adult        4.3 - 5.7                                                           (DCCT/NGSP) American Diabetes Association's Summary of Glycemic Recommendations for Adults with Diabetes: Hemoglobin A1c <7.0%. More stringent glycemic goals (A1c <6.0%) may further reduce complications at the cost of increased risk of hypoglycemia.    Hgb A1c MFr Bld  Date/Time Value Ref Range Status  05/04/2020 03:17 AM 6.4 (H) 4.8 - 5.6 % Final    Comment:    (NOTE) Pre diabetes:          5.7%-6.4%  Diabetes:              >6.4%  Glycemic control for   <7.0% adults with diabetes    CBG: Recent Labs  Lab 05/03/20 1257 05/03/20 1921 05/03/20 2313 05/04/20 0317 05/04/20 0752  GLUCAP 131* 234* 171* 171* 164*     Critical care time: 35 min.   Nickolas Madrid, NP Pulmonary/Critical Care Medicine  05/04/2020  9:31 AM

## 2020-05-04 NOTE — Progress Notes (Signed)
[facility] Progress Note Patient Name: Sheila Ray DOB: 1940-10-17 MRN: [id number]   Date of Service  05/04/2020  HPI/Events of Note  AFIB with RVR - Ventricular rate = 126- 136. BMP pending.   eICU Interventions  Plan: 1. Amiodarone IV load and infusion. 2. Await BMP results. 3. Add Mg++ level to AM labs.      Intervention Category Major Interventions: Arrhythmia - evaluation and management  Jaystin Mcgarvey [place] 05/04/2020, 4:32 AM

## 2020-05-04 NOTE — Progress Notes (Signed)
Patient remains tachycardic, I have administered prn meds twice. Elink notified, RN continues to monitor

## 2020-05-04 NOTE — Progress Notes (Signed)
SLP Cancellation Note  Patient Details Name: Sheila Ray MRN: [id number] DOB: August 04, 1940   Cancelled treatment:       Reason Eval/Treat Not Completed: Patient's level of consciousness;Patient at procedure or test/unavailable (Pt is having EEG completed at this time and per Susie, RN, pt is not currently maintaining alertness for prolonged periods of time. SLP will follow up.)  [name] I. Hardin Negus, [place], [place] Office number [phone] Pager [place] 05/04/2020, 11:00 AM

## 2020-05-04 NOTE — Progress Notes (Signed)
[facility] Progress Note Patient Name: JOBY HERSHKOWITZ DOB: April 22, 1941 MRN: [id number]   Date of Service  05/04/2020  HPI/Events of Note  Progressive increase in Phenylephrine IV infusion.   eICU Interventions  Will bolus with 0.9 NaCl 500 mL IV over 30 minutes now.      Intervention Category Major Interventions: Hypotension - evaluation and management  Shayra Anton [place] 05/04/2020, 6:06 AM

## 2020-05-04 NOTE — Progress Notes (Signed)
OT Cancellation Note  Patient Details Name: Sheila Ray MRN: [id number] DOB: Sep 26, 1940   Cancelled Treatment:    Reason Eval/Treat Not Completed: Patient not medically ready.  Will check back.  Nilsa Nutting., OTR/L Acute Rehabilitation Services Pager [phone] Office [phone]   Lucille Passy M 05/04/2020, 9:47 AM

## 2020-05-04 NOTE — Progress Notes (Signed)
STROKE TEAM PROGRESS NOTE   INTERVAL HISTORY Her echo tech is at the bedside.  Pt lying in bed, still has global aphasia, only says "yes" and not able to follow simple commands. Still has right sided hemiplegia, only mild withdraw to pain on the RLE. Per note, this seemed happened after the thrombectomy. However, MRI after the worsening symptoms only showed scattered left MCA and ACA small infarcts. EEG pending. Still has afib RVR and currently on amiodarone. Also on Neo for low BP. Will relax some BP parameters to avoid fluid overload.   OBJECTIVE Vitals:   05/04/20 0645 05/04/20 0700 05/04/20 0715 05/04/20 0800  BP: (!) 127/91 123/90 117/84 118/76  Pulse: (!) 113 (!) 138 100 94  Resp: (!) 28 18 (!) 22 16  Temp:      TempSrc:      SpO2: 93% 97% 97% 97%    CBC:  Recent Labs  Lab 05/03/20 1254 05/03/20 1259 05/03/20 1813 05/04/20 0317  WBC 13.4*   < > 15.6* 19.2*  NEUTROABS 10.9*  --   --  17.5*  HGB 11.5*   < > 10.6* 10.9*  HCT 37.6   < > 36.0 37.4  MCV 77.8*   < > 78.6* 82.4  PLT 338   < > 414* 407*   < > = values in this interval not displayed.    Basic Metabolic Panel:  Recent Labs  Lab 05/03/20 1317 05/03/20 1317 05/03/20 1320 05/04/20 0317 05/04/20 0442  NA 131*   < > 136 136  --   K 3.7   < > 3.8 4.2  --   CL 102   < > 101 107  --   CO2 22  --   --  17*  --   GLUCOSE 132*   < > 127* 194*  --   BUN 11   < > 9 12  --   CREATININE 0.64   < > 0.60 0.82  --   CALCIUM 8.6*  --   --  8.8*  --   MG  --   --   --   --  1.7   < > = values in this interval not displayed.    Lipid Panel:     Component Value Date/Time   CHOL 173 05/04/2020 0317   CHOL 226 (H) 09/16/2019 1136   CHOL 173 01/06/2013 1354   TRIG 65 05/04/2020 0317   TRIG 120 05/06/2013 1341   TRIG 95 01/06/2013 1354   HDL 36 (L) 05/04/2020 0317   HDL 43 09/16/2019 1136   HDL 47 05/06/2013 1341   HDL 41 01/06/2013 1354   CHOLHDL 4.8 05/04/2020 0317   VLDL 13 05/04/2020 0317   LDLCALC 124 (H)  05/04/2020 0317   LDLCALC 158 (H) 09/16/2019 1136   LDLCALC 109 (H) 05/06/2013 1341   LDLCALC 113 (H) 01/06/2013 1354   HgbA1c:  Lab Results  Component Value Date   HGBA1C 6.4 (H) 05/04/2020   Urine Drug Screen: No results found for: LABOPIA, COCAINSCRNUR, LABBENZ, AMPHETMU, THCU, LABBARB  Alcohol Level     Component Value Date/Time   ETH <10 05/03/2020 1317    IMAGING  CT HEAD CODE STROKE WO CONTRAST CT Angio Head W or Wo Contrast CT Angio Neck W and/or Wo Contrast 05/03/2020   IMPRESSION:   No acute intracranial hemorrhage. No acute infarction identified.   ASPECT score is 10.   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  No hemodynamically significant stenosis the neck.   Partially imaged mild interstitial pulmonary edema and small bilateral pleural effusions.  MR BRAIN WO CONTRAST MR ANGIO HEAD WO CONTRAST 05/03/2020 IMPRESSION:   MRI HEAD  IMPRESSION:  1. 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 Associated minimal petechial hemorrhage at the left temporal region without significant mass effect.  2. Underlying age-related cerebral atrophy with moderate chronic microvascular ischemic disease.   MRA HEAD  IMPRESSION:  1. Technically limited exam due to motion artifact.  2. Previously identified occluded left M2 branch appears grossly patent status post revascularization.  3. Otherwise grossly stable and negative intracranial MRA. No other large vessel occlusion. No other hemodynamically significant or correctable stenosis.   CT CEREBRAL PERFUSION W CONTRAST 05/03/2020 IMPRESSION:  No core infarction identified. 67 mL of penumbra in the posterior left MCA territory.  DG CHEST PORT 1 VIEW 05/03/2020 IMPRESSION:  Vascular congestion with mild parenchymal edema on the right consistent with congestive failure.  Neuro Interventional Radiology - Cerebral Angiogram  with Intervention - Dr Estanislado Pandy 05/03/2020 4:55 PM RT common carotid arteriogram followed by revascularization of occluded sup division M2 branch with x 2 passes with Contact aspiration using Zoom 35 and 55 aspiration catheters and partially of  the distal M3 region of inf division with mechanical thrombolysis achieving a TICI 2C revascularization. Recanalizing Rt ACA A2/A3 junction filling defect noted .  Transthoracic Echocardiogram  1. Left ventricular ejection fraction, by estimation, is 55 to 60%. The  left ventricle has normal function. The left ventricle has no regional  wall motion abnormalities. There is moderate concentric left ventricular  hypertrophy. Left ventricular  diastolic function could not be evaluated.  2. Right ventricular systolic function is mildly reduced. The right  ventricular size is moderately enlarged. There is moderately elevated  pulmonary artery systolic pressure. The estimated right ventricular  systolic pressure is 63.7 mmHg.  3. Left atrial size was moderately dilated.  4. Right atrial size was severely dilated.  5. A small pericardial effusion is present. The pericardial effusion is  posterior to the left ventricle. Large pleural effusion in the left  lateral region.  6. The mitral valve is normal in structure. Moderate mitral valve  regurgitation. No evidence of mitral stenosis.  7. Tricuspid valve regurgitation is moderate to severe.  8. The aortic valve is normal in structure. Aortic valve regurgitation is  trivial. No aortic stenosis is present.  9. The inferior vena cava is normal in size with greater than 50%  respiratory variability, suggesting right atrial pressure of 3 mmHg.   ECG - atrial fibrillation - ventricular response 131 BPM (See cardiology reading for complete details)  EEG - This study is suggestive of cortical dysfunction in left hemisphere, maximal left temporal region consistent with underlying stroke.  No seizures or  definite epileptiform discharges were seen throughout the recording.  PHYSICAL EXAM  Temp:  [96.2 F (35.7 C)-98.4 F (36.9 C)] 98.1 F (36.7 C) (11/04 0800) Pulse Rate:  [32-183] 106 (11/04 1100) Resp:  [12-32] 26 (11/04 1100) BP: (76-170)/(46-124) 122/95 (11/04 1100) SpO2:  [88 %-100 %] 96 % (11/04 1100) Arterial Line BP: (83-310)/(49-304) 112/104 (11/04 0325)  General - Well nourished, well developed, in no apparent distress, lethargic.  Ophthalmologic - fundi not visualized due to noncooperation.  Cardiovascular - irregularly irregular heart rate and rhythm.  Neuro - awake, eyes open, global aphasia, only able to say "yes", not following commands, not name or  repeat. Left gaze preference, barely cross midline. Inconsistently blinking to visual threat bilaterally. PERRL, no significant facial droop. Tongue protrusion not cooperative. LUE strong and active, no drift. LLE brisk movement 3/5 on pain. RUE flaccid, 1/5 on pain. RLE mild withdraw to pain 2-/5. Sensation, coordination not cooperative and gait not tested.    ASSESSMENT/PLAN [name] is a 79 y.o. 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 She did not receive IV t-PA due to anticoagulation with Xarelto. IR - Cerebral Angiogram with Intervention - 05/03/20 - revascularization of occluded sup division M2 branch.  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  CT Head - No acute intracranial hemorrhage. No acute infarction identified. ASPECT score is 10.      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 No hemodynamically significant stenosis the neck.   CT Perfusion - No core  infarction identified. 67 mL of penumbra in the posterior left MCA territory.  MRI head x 2 - Patchy acute ischemic infarcts involving the MCA and ACA distributions, predominantly cortical in nature.    MRA head x 2 -  Previously identified occluded left M2 branch appears grossly patent status post revascularization.   EEG - left cortical dysfunction, no seizure  2D Echo - EF 55-60%  [employer] Virus 2 - negative  LDL - 124  HgbA1c - 6.4  VTE prophylaxis - [place] Heparin  Xarelto (rivaroxaban) daily prior to admission, now on ASA 325mg  daily. May consider resume AC in 3-5 days post stroke.  Patient counseled to be compliant with her antithrombotic medications  Ongoing aggressive stroke risk factor management  Therapy recommendations:  pending  Disposition:  Pending  Afib RVR  On amiodarone IV drip  On metoprolol PRN  Currently rate controlled  On Xarelto PTA  Now on ASA 325. Will consider to resume AC in 3-5 days post stroke  Hypertension  Home BP meds: Toprol ; Zestril  Current BP meds: metoprolol ; prn Cleviprex ; prn phnylephrine  Blood pressure somewhat low  . SBP goal 120- 140 within 24h s/p IR . Long-term BP goal normotensive  Hyperlipidemia  Home Lipid lowering medication: none   LDL 124, goal < 70  Put on zetia  Hx of statin intolerance (Lipitor ; Pravastatin ; Crestor)  Diabetes  Home diabetic meds: metformin  Current diabetic meds: SSI   HgbA1c 6.4, goal < 7.0  CBG monitoring  PCP follow up  Other Stroke Risk Factors  Advanced age  Obesity, recommend weight loss, diet and exercise as appropriate   Hx stroke/TIA - left thalamic stroke (no details)  Other Active Problems  Code status - Full code   Leukocytosis - WBC's -  13.4->15.6->19.2 (afebrile)    Hospital day # 1  This patient is critically ill due to left MCA stroke due to left M2 occlusion s/p thrombectomy, afib RVR, dysphagia and at significant risk of neurological  worsening, death form recurrent infarct, hemorrhagic conversion, heart failure, seizure, aspiration. This patient's care requires constant monitoring of vital signs, hemodynamics, respiratory and cardiac monitoring, review of multiple databases, neurological assessment, discussion with family, other specialists and medical decision making of high complexity. I spent 40 minutes of neurocritical care time in  the care of this patient. I had long discussion with son at bedside, updated pt current condition, treatment plan and potential prognosis, and answered all the questions. He expressed understanding and appreciation.   Rosalin Hawking, MD PhD Stroke Neurology 05/04/2020 6:03 PM  To contact Stroke Continuity provider, please refer to [email or web address] After hours, contact General Neurology

## 2020-05-04 NOTE — Progress Notes (Signed)
EEG completed, results pending. 

## 2020-05-04 NOTE — Progress Notes (Signed)
Referring Physician(s): * No referring provider recorded for this case *  Supervising Physician: Luanne Bras  Patient Status:  [facility] - In-pt  Chief Complaint: L M2/M3 occlusion  Subjective: Patient extubated, on non-rebreather this AM.  Encephalopathic. Not following commands.  Remains tachycardic. On pressors.  Minimal movement noted in the right toes, but no other spontaneous movement. Moving left-side.  Allergies: Crestor [rosuvastatin], Lipitor [atorvastatin], Pravastatin, and Keflex [cephalexin]  Medications: Prior to Admission medications   Medication Sig Start Date End Date Taking? Authorizing Provider  alendronate (FOSAMAX) 70 MG tablet TAKE 1 TABLET BY MOUTH  WEEKLY AS DIRECTED Patient taking differently: Take 70 mg by mouth once a week.  03/15/19   [name] A, FNP  Cyanocobalamin (VITAMIN B12) 1000 MCG TBCR Take 1,000 mcg by mouth.     [provider]  diltiazem (CARDIZEM CD) 240 MG 24 hr capsule TAKE 1 CAPSULE BY MOUTH  DAILY Patient taking differently: Take 240 mg by mouth daily.  03/18/19   [name] A, FNP  glucose blood (ONE TOUCH ULTRA TEST) test strip USE TO CHECK BLOOD GLUCOSE  ONCE A DAY AS INSTRUCTED 12/16/19   [name] A, FNP  lisinopril (ZESTRIL) 40 MG tablet TAKE 1 TABLET BY MOUTH  DAILY Patient taking differently: Take 40 mg by mouth daily.  03/18/19   Sharion Balloon, FNP  metFORMIN (GLUCOPHAGE) 1000 MG tablet Take 0.5 tablets (500 mg total) by mouth daily with breakfast. 12/16/19   [name] A, FNP  metoprolol succinate (TOPROL-XL) 50 MG 24 hr tablet TAKE 1 TABLET BY MOUTH  DAILY WITH OR IMMEDIATELY  FOLLOWING A MEAL Patient taking differently: Take 50 mg by mouth daily.  02/16/20   Sharion Balloon, FNP  ONETOUCH DELICA LANCETS 31D MISC 1 each by Does not apply route daily. Use to check BG daily 09/12/17   Hawks, Alyse Low A, FNP  XARELTO 20 MG TABS tablet TAKE 1 TABLET BY MOUTH  DAILY Patient taking differently: Take 20  mg by mouth daily with supper.  12/03/18   Sharion Balloon, FNP     Vital Signs: BP (!) 122/95   Pulse (!) 106   Temp 98.1 F (36.7 C)   Resp (!) 26   SpO2 96%   Physical Exam  Arousable but not following commands Tachycardic Neuro: opens eyes to voice and spontaneously, but no tracking.  Vocalizes, but no intelligible words. Moving left upper and lower extremity spontaneously.  Only slight movement in right toes- otherwise no movement in right upper and lower extremities.  Groin soft, no evidence of hematoma or pseudoaneurysm.  Imaging: CT Angio Head W or Wo Contrast  Addendum Date: 05/03/2020   ADDENDUM REPORT: 05/03/2020 14:38 ADDENDUM: Exam and technique should not include CT perfusion, which was performed subsequently and dictated separately. Only CT head without contrast and CT angiography of the head and neck with contrast performed. Electronically Signed   By: Macy Mis M.D.   On: 05/03/2020 14:38   Result Date: 05/03/2020 CLINICAL DATA:  Code stroke.  Right-sided weakness EXAM: CT HEAD WITHOUT CONTRAST CT ANGIOGRAPHY HEAD AND NECK CT PERFUSION BRAIN TECHNIQUE: Contiguous axial images were obtained the skull base to the vertex without contrast. Multidetector CT imaging of the head and neck was performed using the standard protocol during bolus administration of intravenous contrast. Multiplanar CT image reconstructions and MIPs were obtained to evaluate the vascular anatomy. Carotid stenosis measurements (when applicable) are obtained utilizing NASCET criteria, using the distal internal carotid diameter  as the denominator. Multiphase CT imaging of the brain was performed following IV bolus contrast injection. Subsequent parametric perfusion maps were calculated using RAPID software. CONTRAST:  33mL OMNIPAQUE IOHEXOL 350 MG/ML SOLN COMPARISON:  None. FINDINGS: CT HEAD Brain: There is no acute intracranial hemorrhage, mass effect, or edema. Gray-white differentiation is preserved.  Patchy and confluent areas hypoattenuation in the supratentorial white but may reflect moderate chronic microvascular ischemic changes. There is no extra-axial fluid collection. Prominence of the ventricles and sulci reflects generalized parenchymal volume loss. Vascular: No hyperdense vessel. Skull: Calvarium is unremarkable. Sinuses/Orbits: No acute finding. Other: None. ASPECTS ([place] Stroke Program Early CT Score) - Ganglionic level infarction (caudate, lentiform nuclei, internal capsule, insula, M1-M3 cortex): 7 - Supraganglionic infarction (M4-M6 cortex): 3 Total score (0-10 with 10 being normal): 10 CTA NECK Aortic arch: Great vessel origins are patent. Right carotid system: Patent. Mild calcified plaque at the ICA origin causing minimal stenosis. Left carotid system: Patent. Minimal calcified plaque at the proximal ICA without measurable stenosis. Vertebral arteries: Patent.  Right vertebral artery is dominant. Skeleton: Degenerative changes of the cervical spine primarily C6-C7. Other neck: Heterogeneous, multinodular thyroid. Further ultrasound follow-up is not recommended by current guidelines. Upper chest: Partially imaged small bilateral pleural effusions. Peribronchial and interstitial thickening likely reflecting interstitial edema. Review of the MIP images confirms the above findings CTA HEAD Anterior circulation: Intracranial internal carotid arteries are patent with mild calcified plaque. Left M1 MCA is patent. There is occlusion of a M2 MCA branch approximately 13 mm from the MCA trifurcation (see saved key image series 11, 27). There is partial reconstitution followed by occlusion and subsequent distal M3 reconstitution. Right middle and both anterior cerebral arteries are patent. Posterior circulation: Intracranial vertebral arteries are patent with minimal calcified plaque on the right. Basilar artery is patent. Posterior cerebral arteries are patent. There are bilateral posterior  communicating arteries present. Venous sinuses: Patent as allowed by contrast bolus timing. Review of the MIP images confirms the above findings IMPRESSION: No acute intracranial hemorrhage. No acute infarction identified. ASPECT score is 10. 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 No hemodynamically significant stenosis the neck. Partially imaged mild interstitial pulmonary edema and small bilateral pleural effusions. Results were called by telephone at the time of interpretation on 05/03/2020 at 1:09 pm and again at 1:17 p.m. to provider [facility] , who verbally acknowledged these results. Electronically Signed: By: Macy Mis M.D. On: 05/03/2020 13:32   CT Angio Neck W and/or Wo Contrast  Addendum Date: 05/03/2020   ADDENDUM REPORT: 05/03/2020 14:38 ADDENDUM: Exam and technique should not include CT perfusion, which was performed subsequently and dictated separately. Only CT head without contrast and CT angiography of the head and neck with contrast performed. Electronically Signed   By: Macy Mis M.D.   On: 05/03/2020 14:38   Result Date: 05/03/2020 CLINICAL DATA:  Code stroke.  Right-sided weakness EXAM: CT HEAD WITHOUT CONTRAST CT ANGIOGRAPHY HEAD AND NECK CT PERFUSION BRAIN TECHNIQUE: Contiguous axial images were obtained the skull base to the vertex without contrast. Multidetector CT imaging of the head and neck was performed using the standard protocol during bolus administration of intravenous contrast. Multiplanar CT image reconstructions and MIPs were obtained to evaluate the vascular anatomy. Carotid stenosis measurements (when applicable) are obtained utilizing NASCET criteria, using the distal internal carotid diameter as the denominator. Multiphase CT imaging of the brain was performed following IV bolus contrast injection. Subsequent parametric perfusion maps were calculated using  RAPID software. CONTRAST:  5mL OMNIPAQUE IOHEXOL 350 MG/ML SOLN  COMPARISON:  None. FINDINGS: CT HEAD Brain: There is no acute intracranial hemorrhage, mass effect, or edema. Gray-white differentiation is preserved. Patchy and confluent areas hypoattenuation in the supratentorial white but may reflect moderate chronic microvascular ischemic changes. There is no extra-axial fluid collection. Prominence of the ventricles and sulci reflects generalized parenchymal volume loss. Vascular: No hyperdense vessel. Skull: Calvarium is unremarkable. Sinuses/Orbits: No acute finding. Other: None. ASPECTS ([place] Stroke Program Early CT Score) - Ganglionic level infarction (caudate, lentiform nuclei, internal capsule, insula, M1-M3 cortex): 7 - Supraganglionic infarction (M4-M6 cortex): 3 Total score (0-10 with 10 being normal): 10 CTA NECK Aortic arch: Great vessel origins are patent. Right carotid system: Patent. Mild calcified plaque at the ICA origin causing minimal stenosis. Left carotid system: Patent. Minimal calcified plaque at the proximal ICA without measurable stenosis. Vertebral arteries: Patent.  Right vertebral artery is dominant. Skeleton: Degenerative changes of the cervical spine primarily C6-C7. Other neck: Heterogeneous, multinodular thyroid. Further ultrasound follow-up is not recommended by current guidelines. Upper chest: Partially imaged small bilateral pleural effusions. Peribronchial and interstitial thickening likely reflecting interstitial edema. Review of the MIP images confirms the above findings CTA HEAD Anterior circulation: Intracranial internal carotid arteries are patent with mild calcified plaque. Left M1 MCA is patent. There is occlusion of a M2 MCA branch approximately 13 mm from the MCA trifurcation (see saved key image series 11, 27). There is partial reconstitution followed by occlusion and subsequent distal M3 reconstitution. Right middle and both anterior cerebral arteries are patent. Posterior circulation: Intracranial vertebral arteries are patent  with minimal calcified plaque on the right. Basilar artery is patent. Posterior cerebral arteries are patent. There are bilateral posterior communicating arteries present. Venous sinuses: Patent as allowed by contrast bolus timing. Review of the MIP images confirms the above findings IMPRESSION: No acute intracranial hemorrhage. No acute infarction identified. ASPECT score is 10. 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 No hemodynamically significant stenosis the neck. Partially imaged mild interstitial pulmonary edema and small bilateral pleural effusions. Results were called by telephone at the time of interpretation on 05/03/2020 at 1:09 pm and again at 1:17 p.m. to provider [facility] , who verbally acknowledged these results. Electronically Signed: By: Macy Mis M.D. On: 05/03/2020 13:32   MR ANGIO HEAD WO CONTRAST  Result Date: 05/03/2020 CLINICAL DATA:  Follow-up examination for acute stroke. Patient with previously identified left M2 branch occlusion, superior division. Status post catheter directed intervention achieving TICI2C revascularization. Recanalized A2/A3 filling defect also noted on arteriogram. EXAM: MRI HEAD WITHOUT CONTRAST MRA HEAD WITHOUT CONTRAST TECHNIQUE: Multiplanar, multiecho pulse sequences of the brain and surrounding structures were obtained without intravenous contrast. Angiographic images of the head were obtained using MRA technique without contrast. COMPARISON:  Prior CTs from earlier the same day. FINDINGS: MRI HEAD FINDINGS Brain: Examination mildly degraded by motion artifact. Generalized age-related cerebral atrophy. Patchy and confluent T2/FLAIR hyperintensity within the periventricular, deep, and subcortical white matter both cerebral hemispheres, nonspecific, but most like related chronic microvascular ischemic disease, moderate in nature. Superimposed remote lacunar infarct at the left corona radiata/basal ganglia. Patchy areas of  restricted diffusion seen involving the cortical gray matter of the left insula, left temporal occipital region, and mesial left temporal lobe are seen, consistent with small volume acute ischemic infarcts (series 5, images 70, 69, 67). These are predominantly left MCA distribution. Additional patchy small volume cortical infarcts noted at the high  left frontoparietal region (series 5, images 91, 85), left ACA and MCA distribution. No associated mass effect. Single small focus of associated petechial hemorrhage present at the left temporal region (series 18, image 27). No other evidence for acute or subacute ischemia. Gray-white matter differentiation otherwise maintained. No mass lesion, mass effect, or midline shift. No hydrocephalus or extra-axial fluid collection. Pituitary gland and suprasellar region within normal limits. Vascular: Major intracranial vascular flow voids are maintained. Skull and upper cervical spine: Craniocervical junction within normal limits. Bone marrow signal intensity normal. No scalp soft tissue abnormality. Sinuses/Orbits: Globes and orbital soft tissues within normal limits. Paranasal sinuses are clear. No mastoid effusion. Other: None. MRA HEAD FINDINGS ANTERIOR CIRCULATION: Examination degraded by motion artifact. Visualized distal cervical segments of the internal carotid arteries are grossly patent with antegrade flow. Apparent short-segment defect at the distal cervical left ICA felt to be due to motion artifact, as no stenosis or other defect seen within this region on corresponding arteriogram. Petrous, cavernous, and supraclinoid segments of both internal carotid arteries patent without appreciable stenosis or other abnormality. A1 segments patent bilaterally. Grossly normal anterior communicating artery complex. Partially visualized ACAs perfused to their distal aspects without appreciable stenosis. M1 segments patent bilaterally. Left MCA trifurcations. Previously identified  occluded left M2 branch appears grossly patent status post revascularization. MCA branches otherwise well perfused bilaterally. POSTERIOR CIRCULATION: Right vertebral artery strongly dominant and patent to the vertebrobasilar junction. Right PICA not definitely seen. Left vertebral artery hypoplastic and largely terminates in PICA, although a small branch seen ascending towards the vertebrobasilar junction. Left PICA patent proximally. Basilar diffusely irregular but is patent to its distal aspect without high-grade stenosis. Superior cerebral arteries grossly patent proximally. Both PCAs are patent proximally, not well assessed distally due to motion. No intracranial aneurysm. IMPRESSION: MRI HEAD IMPRESSION: 1. 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 Associated minimal petechial hemorrhage at the left temporal region without significant mass effect. 2. Underlying age-related cerebral atrophy with moderate chronic microvascular ischemic disease. MRA HEAD IMPRESSION: 1. Technically limited exam due to motion artifact. 2. Previously identified occluded left M2 branch appears grossly patent status post revascularization. 3. Otherwise grossly stable and negative intracranial MRA. No other large vessel occlusion. No other hemodynamically significant or correctable stenosis. Electronically Signed   By: Jeannine Boga M.D.   On: 05/03/2020 22:06   MR BRAIN WO CONTRAST  Result Date: 05/03/2020 CLINICAL DATA:  Follow-up examination for acute stroke. Patient with previously identified left M2 branch occlusion, superior division. Status post catheter directed intervention achieving TICI2C revascularization. Recanalized A2/A3 filling defect also noted on arteriogram. EXAM: MRI HEAD WITHOUT CONTRAST MRA HEAD WITHOUT CONTRAST TECHNIQUE: Multiplanar, multiecho pulse sequences of the brain and surrounding structures were obtained without  intravenous contrast. Angiographic images of the head were obtained using MRA technique without contrast. COMPARISON:  Prior CTs from earlier the same day. FINDINGS: MRI HEAD FINDINGS Brain: Examination mildly degraded by motion artifact. Generalized age-related cerebral atrophy. Patchy and confluent T2/FLAIR hyperintensity within the periventricular, deep, and subcortical white matter both cerebral hemispheres, nonspecific, but most like related chronic microvascular ischemic disease, moderate in nature. Superimposed remote lacunar infarct at the left corona radiata/basal ganglia. Patchy areas of restricted diffusion seen involving the cortical gray matter of the left insula, left temporal occipital region, and mesial left temporal lobe are seen, consistent with small volume acute ischemic infarcts (series 5, images 70, 69, 67). These are predominantly left MCA distribution.  Additional patchy small volume cortical infarcts noted at the high left frontoparietal region (series 5, images 91, 85), left ACA and MCA distribution. No associated mass effect. Single small focus of associated petechial hemorrhage present at the left temporal region (series 18, image 27). No other evidence for acute or subacute ischemia. Gray-white matter differentiation otherwise maintained. No mass lesion, mass effect, or midline shift. No hydrocephalus or extra-axial fluid collection. Pituitary gland and suprasellar region within normal limits. Vascular: Major intracranial vascular flow voids are maintained. Skull and upper cervical spine: Craniocervical junction within normal limits. Bone marrow signal intensity normal. No scalp soft tissue abnormality. Sinuses/Orbits: Globes and orbital soft tissues within normal limits. Paranasal sinuses are clear. No mastoid effusion. Other: None. MRA HEAD FINDINGS ANTERIOR CIRCULATION: Examination degraded by motion artifact. Visualized distal cervical segments of the internal carotid arteries are  grossly patent with antegrade flow. Apparent short-segment defect at the distal cervical left ICA felt to be due to motion artifact, as no stenosis or other defect seen within this region on corresponding arteriogram. Petrous, cavernous, and supraclinoid segments of both internal carotid arteries patent without appreciable stenosis or other abnormality. A1 segments patent bilaterally. Grossly normal anterior communicating artery complex. Partially visualized ACAs perfused to their distal aspects without appreciable stenosis. M1 segments patent bilaterally. Left MCA trifurcations. Previously identified occluded left M2 branch appears grossly patent status post revascularization. MCA branches otherwise well perfused bilaterally. POSTERIOR CIRCULATION: Right vertebral artery strongly dominant and patent to the vertebrobasilar junction. Right PICA not definitely seen. Left vertebral artery hypoplastic and largely terminates in PICA, although a small branch seen ascending towards the vertebrobasilar junction. Left PICA patent proximally. Basilar diffusely irregular but is patent to its distal aspect without high-grade stenosis. Superior cerebral arteries grossly patent proximally. Both PCAs are patent proximally, not well assessed distally due to motion. No intracranial aneurysm. IMPRESSION: MRI HEAD IMPRESSION: 1. 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 Associated minimal petechial hemorrhage at the left temporal region without significant mass effect. 2. Underlying age-related cerebral atrophy with moderate chronic microvascular ischemic disease. MRA HEAD IMPRESSION: 1. Technically limited exam due to motion artifact. 2. Previously identified occluded left M2 branch appears grossly patent status post revascularization. 3. Otherwise grossly stable and negative intracranial MRA. No other large vessel occlusion. No other hemodynamically significant  or correctable stenosis. Electronically Signed   By: Jeannine Boga M.D.   On: 05/03/2020 22:06   CT CEREBRAL PERFUSION W CONTRAST  Result Date: 05/03/2020 CLINICAL DATA:  Right-sided weakness, M2 MCA occlusion EXAM: CT PERFUSION BRAIN TECHNIQUE: Multiphase CT imaging of the brain was performed following IV bolus contrast injection. Subsequent parametric perfusion maps were calculated using RAPID software. CONTRAST:  40mL OMNIPAQUE IOHEXOL 350 MG/ML SOLN COMPARISON:  None. FINDINGS: CT Brain Perfusion Findings: CBF (<30%) Volume: 35mL Perfusion (Tmax>6.0s) volume: 29mL Mismatch Volume: 54mL ASPECTS on noncontrast CT Head: 10 at 1:02 p.m. today. Infarct Core: 0 mL Infarction Location:Territory at risk in the posterior left MCA territory IMPRESSION: No core infarction identified. 67 mL of penumbra in the posterior left MCA territory. Electronically Signed   By: Macy Mis M.D.   On: 05/03/2020 14:36   DG CHEST PORT 1 VIEW  Result Date: 05/03/2020 CLINICAL DATA:  Hypoxia, recent cerebral revascularization. EXAM: PORTABLE CHEST 1 VIEW COMPARISON:  02/07/2020 FINDINGS: Cardiac shadow is enlarged but stable. Diffuse vascular congestion is noted with some patchy edema particularly in the right lung. Small effusions are noted bilaterally.  No bony abnormality is seen. IMPRESSION: Vascular congestion with mild parenchymal edema on the right consistent with congestive failure. Electronically Signed   By: Inez Catalina M.D.   On: 05/03/2020 19:25   ECHOCARDIOGRAM COMPLETE  Result Date: 05/04/2020    ECHOCARDIOGRAM REPORT   Patient Name:   ANALEESE ANDREATTA Date of Exam: 05/04/2020 Medical Rec #:  [id number]       Height:       64.5 in Accession #:    [id number]      Weight:       175.0 lb Date of Birth:  03-17-1941        BSA:          1.859 m Patient Age:    79 years        BP:           121/79 mmHg Patient Gender: F               HR:           96 bpm. Exam Location:  Inpatient Procedure: 2D Echo, Cardiac  Doppler, Color Doppler and Intracardiac            Opacification Agent Indications:    CVA  History:        Patient has no prior history of Echocardiogram examinations.                 Stroke, Arrythmias:Atrial Fibrillation; Risk                 Factors:Hypertension, Diabetes, Dyslipidemia and Obesity.  Sonographer:    Dustin Flock Referring Phys: [id number] [place]  1. Left ventricular ejection fraction, by estimation, is 55 to 60%. The left ventricle has normal function. The left ventricle has no regional wall motion abnormalities. There is moderate concentric left ventricular hypertrophy. Left ventricular diastolic function could not be evaluated.  2. Right ventricular systolic function is mildly reduced. The right ventricular size is moderately enlarged. There is moderately elevated pulmonary artery systolic pressure. The estimated right ventricular systolic pressure is 28.7 mmHg.  3. Left atrial size was moderately dilated.  4. Right atrial size was severely dilated.  5. A small pericardial effusion is present. The pericardial effusion is posterior to the left ventricle. Large pleural effusion in the left lateral region.  6. The mitral valve is normal in structure. Moderate mitral valve regurgitation. No evidence of mitral stenosis.  7. Tricuspid valve regurgitation is moderate to severe.  8. The aortic valve is normal in structure. Aortic valve regurgitation is trivial. No aortic stenosis is present.  9. The inferior vena cava is normal in size with greater than 50% respiratory variability, suggesting right atrial pressure of 3 mmHg. Conclusion(s)/Recommendation(s): No intracardiac source of embolism detected on this transthoracic study. A transesophageal echocardiogram is recommended to exclude cardiac source of embolism if clinically indicated. FINDINGS  Left Ventricle: Left ventricular ejection fraction, by estimation, is 55 to 60%. The left ventricle has normal function. The left  ventricle has no regional wall motion abnormalities. Definity contrast agent was given IV to delineate the left ventricular  endocardial borders. The left ventricular internal cavity size was normal in size. There is moderate concentric left ventricular hypertrophy. Left ventricular diastolic function could not be evaluated due to atrial fibrillation. Left ventricular diastolic function could not be evaluated. Normal left ventricular filling pressure. Right Ventricle: The right ventricular size is moderately enlarged. No increase in right ventricular wall thickness. Right ventricular systolic function  is mildly reduced. There is moderately elevated pulmonary artery systolic pressure. The tricuspid regurgitant velocity is 2.86 m/s, and with an assumed right atrial pressure of 15 mmHg, the estimated right ventricular systolic pressure is 56.3 mmHg. Left Atrium: Left atrial size was moderately dilated. Right Atrium: Right atrial size was severely dilated. Pericardium: A small pericardial effusion is present. The pericardial effusion is posterior to the left ventricle. Mitral Valve: The mitral valve is normal in structure. Moderate mitral valve regurgitation. No evidence of mitral valve stenosis. Tricuspid Valve: The tricuspid valve is normal in structure. Tricuspid valve regurgitation is moderate to severe. No evidence of tricuspid stenosis. Aortic Valve: The aortic valve is normal in structure. Aortic valve regurgitation is trivial. No aortic stenosis is present. Pulmonic Valve: The pulmonic valve was normal in structure. Pulmonic valve regurgitation is not visualized. No evidence of pulmonic stenosis. Aorta: The aortic root is normal in size and structure. Venous: The inferior vena cava is normal in size with greater than 50% respiratory variability, suggesting right atrial pressure of 3 mmHg. IAS/Shunts: No atrial level shunt detected by color flow Doppler. Additional Comments: No apical thrombus on Definity  echocontrast images. There is a large pleural effusion in the left lateral region.  LEFT VENTRICLE PLAX 2D LVIDd:         4.10 cm  Diastology LVIDs:         2.80 cm  LV e' medial:    5.44 cm/s LV PW:         1.30 cm  LV E/e' medial:  17.2 LV IVS:        1.20 cm  LV e' lateral:   7.94 cm/s LVOT diam:     2.00 cm  LV E/e' lateral: 11.8 LV SV:         33 LV SV Index:   18 LVOT Area:     3.14 cm  RIGHT VENTRICLE RV Basal diam:  3.30 cm RV S prime:     6.96 cm/s TAPSE (M-mode): 2.9 cm LEFT ATRIUM             Index       RIGHT ATRIUM           Index LA diam:        4.60 cm 2.48 cm/m  RA Area:     18.00 cm LA Vol (A2C):   69.7 ml 37.50 ml/m RA Volume:   47.80 ml  25.72 ml/m LA Vol (A4C):   75.7 ml 40.73 ml/m LA Biplane Vol: 74.7 ml 40.19 ml/m  AORTIC VALVE LVOT Vmax:   71.10 cm/s LVOT Vmean:  44.800 cm/s LVOT VTI:    0.105 m  AORTA Ao Root diam: 2.70 cm MITRAL VALVE               TRICUSPID VALVE MV Area (PHT): 3.77 cm    TR Peak grad:   32.7 mmHg MV Decel Time: 201 msec    TR Vmax:        286.00 cm/s MV E velocity: 93.60 cm/s                            SHUNTS                            Systemic VTI:  0.10 m  Systemic Diam: 2.00 cm Ena Dawley MD Electronically signed by Ena Dawley MD Signature Date/Time: 05/04/2020/11:16:53 AM    Final    CT HEAD CODE STROKE WO CONTRAST  Addendum Date: 05/03/2020   ADDENDUM REPORT: 05/03/2020 14:38 ADDENDUM: Exam and technique should not include CT perfusion, which was performed subsequently and dictated separately. Only CT head without contrast and CT angiography of the head and neck with contrast performed. Electronically Signed   By: Macy Mis M.D.   On: 05/03/2020 14:38   Result Date: 05/03/2020 CLINICAL DATA:  Code stroke.  Right-sided weakness EXAM: CT HEAD WITHOUT CONTRAST CT ANGIOGRAPHY HEAD AND NECK CT PERFUSION BRAIN TECHNIQUE: Contiguous axial images were obtained the skull base to the vertex without contrast. Multidetector CT  imaging of the head and neck was performed using the standard protocol during bolus administration of intravenous contrast. Multiplanar CT image reconstructions and MIPs were obtained to evaluate the vascular anatomy. Carotid stenosis measurements (when applicable) are obtained utilizing NASCET criteria, using the distal internal carotid diameter as the denominator. Multiphase CT imaging of the brain was performed following IV bolus contrast injection. Subsequent parametric perfusion maps were calculated using RAPID software. CONTRAST:  79mL OMNIPAQUE IOHEXOL 350 MG/ML SOLN COMPARISON:  None. FINDINGS: CT HEAD Brain: There is no acute intracranial hemorrhage, mass effect, or edema. Gray-white differentiation is preserved. Patchy and confluent areas hypoattenuation in the supratentorial white but may reflect moderate chronic microvascular ischemic changes. There is no extra-axial fluid collection. Prominence of the ventricles and sulci reflects generalized parenchymal volume loss. Vascular: No hyperdense vessel. Skull: Calvarium is unremarkable. Sinuses/Orbits: No acute finding. Other: None. ASPECTS ([place] Stroke Program Early CT Score) - Ganglionic level infarction (caudate, lentiform nuclei, internal capsule, insula, M1-M3 cortex): 7 - Supraganglionic infarction (M4-M6 cortex): 3 Total score (0-10 with 10 being normal): 10 CTA NECK Aortic arch: Great vessel origins are patent. Right carotid system: Patent. Mild calcified plaque at the ICA origin causing minimal stenosis. Left carotid system: Patent. Minimal calcified plaque at the proximal ICA without measurable stenosis. Vertebral arteries: Patent.  Right vertebral artery is dominant. Skeleton: Degenerative changes of the cervical spine primarily C6-C7. Other neck: Heterogeneous, multinodular thyroid. Further ultrasound follow-up is not recommended by current guidelines. Upper chest: Partially imaged small bilateral pleural effusions. Peribronchial and  interstitial thickening likely reflecting interstitial edema. Review of the MIP images confirms the above findings CTA HEAD Anterior circulation: Intracranial internal carotid arteries are patent with mild calcified plaque. Left M1 MCA is patent. There is occlusion of a M2 MCA branch approximately 13 mm from the MCA trifurcation (see saved key image series 11, 27). There is partial reconstitution followed by occlusion and subsequent distal M3 reconstitution. Right middle and both anterior cerebral arteries are patent. Posterior circulation: Intracranial vertebral arteries are patent with minimal calcified plaque on the right. Basilar artery is patent. Posterior cerebral arteries are patent. There are bilateral posterior communicating arteries present. Venous sinuses: Patent as allowed by contrast bolus timing. Review of the MIP images confirms the above findings IMPRESSION: No acute intracranial hemorrhage. No acute infarction identified. ASPECT score is 10. 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 No hemodynamically significant stenosis the neck. Partially imaged mild interstitial pulmonary edema and small bilateral pleural effusions. Results were called by telephone at the time of interpretation on 05/03/2020 at 1:09 pm and again at 1:17 p.m. to provider [facility] , who verbally acknowledged these results. Electronically Signed: By: Macy Mis M.D. On: 05/03/2020 13:32  Labs:  CBC: Recent Labs    02/07/20 1119 02/07/20 1119 05/03/20 1254 05/03/20 1254 05/03/20 1259 05/03/20 1320 05/03/20 1813 05/04/20 0317  WBC 8.1  --  13.4*  --   --   --  15.6* 19.2*  HGB 12.6   < > 11.5*   < > 12.6 11.2* 10.6* 10.9*  HCT 42.2   < > 37.6   < > 37.0 33.0* 36.0 37.4  PLT 306  --  338  --   --   --  414* 407*   < > = values in this interval not displayed.    COAGS: Recent Labs    02/07/20 1119 05/03/20 1317  INR 3.3* 1.2  APTT  --  32    BMP: Recent Labs     09/16/19 1136 09/16/19 1136 12/28/19 1136 12/28/19 1136 02/07/20 1119 02/07/20 1119 05/03/20 1259 05/03/20 1317 05/03/20 1320 05/04/20 0317  NA 139   < > 141  --  138   < > 134* 131* 136 136  K 4.3   < > 4.1   < > 4.1   < > 4.1 3.7 3.8 4.2  CL 104   < > 106   < > 108   < > 108 102 101 107  CO2 23   < > 21  --  21*  --   --  22  --  17*  GLUCOSE 100*   < > 109*  --  127*   < > 132* 132* 127* 194*  BUN 11   < > 12  --  14   < > 10 11 9 12   CALCIUM 9.6   < > 9.2  --  9.2  --   --  8.6*  --  8.8*  CREATININE 0.83   < > 1.09*   < > 0.70   < > 0.50 0.64 0.60 0.82  GFRNONAA 68   < > 48*  --  >60  --   --  >60  --  >60  GFRAA 78  --  56*  --  >60  --   --   --   --   --    < > = values in this interval not displayed.    LIVER FUNCTION TESTS: Recent Labs    09/16/19 1136 02/07/20 1119 05/03/20 1317  BILITOT 0.2 0.3 0.5  AST 17 20 15   ALT 11 18 12   ALKPHOS 68 58 52  PROT 6.3 6.5 5.7*  ALBUMIN 4.0 3.5 3.0*    Assessment and Plan: S/p revascularization of occluded sup division M2 branch with x 2 passes with  Contact aspiration using Zoom 35 and 55 aspiration catheters and partially of  the distal M3 region of inf division with mechanical thrombolysis achieving a TICI 2C revascularization. Recanalizing Rt ACA A2/A3 junction filling defect noted Patient s/p intervention for acute stroke.  History of A fib with RVR now on amiodarone  Requiring Neo for hypotension.  Encephalopathic, on non-rebreather, but protecting her airway.  Question whether her current obtundation is fully explained by stroke alone, could be anesthesia related? Post-procedure imaging reviewed by Dr. Estanislado Pandy.  No new findings.  Maintain BP parameters.  May remove dressing on groin.  IR continuing to follow along with Neuro and CCM.   Electronically Signed: Docia Barrier, PA 05/04/2020, 11:47 AM   I spent a total of 15 Minutes at the the patient's bedside AND on the patient's hospital floor or  unit, greater than 50%  of which was counseling/coordinating care for left M2/M3 occlusion

## 2020-05-04 NOTE — Progress Notes (Signed)
  Echocardiogram 2D Echocardiogram has been performed.  Sheila Ray 05/04/2020, 10:28 AM

## 2020-05-04 NOTE — Anesthesia Postprocedure Evaluation (Signed)
Anesthesia Post Note  Patient: Sheila Ray  Procedure(s) Performed: IR WITH ANESTHESIA (N/A )     Patient location during evaluation: ICU Anesthesia Type: General Level of consciousness: awake Pain management: pain level controlled Vital Signs Assessment: post-procedure vital signs reviewed and stable Respiratory status: spontaneous breathing Cardiovascular status: stable Postop Assessment: no apparent nausea or vomiting Anesthetic complications: no   No complications documented.  Last Vitals:  Vitals:   05/04/20 0715 05/04/20 0800  BP: 117/84 118/76  Pulse: 100 94  Resp: (!) 22 16  Temp:    SpO2: 97% 97%    Last Pain:  Vitals:   05/04/20 0400  TempSrc: Axillary  PainSc:                  Effie Berkshire

## 2020-05-05 ENCOUNTER — Inpatient Hospital Stay (HOSPITAL_COMMUNITY): Payer: Medicare Other

## 2020-05-05 DIAGNOSIS — I4891 Unspecified atrial fibrillation: Secondary | ICD-10-CM | POA: Diagnosis not present

## 2020-05-05 DIAGNOSIS — I248 Other forms of acute ischemic heart disease: Secondary | ICD-10-CM | POA: Diagnosis not present

## 2020-05-05 DIAGNOSIS — I63512 Cerebral infarction due to unspecified occlusion or stenosis of left middle cerebral artery: Secondary | ICD-10-CM | POA: Diagnosis not present

## 2020-05-05 DIAGNOSIS — R0902 Hypoxemia: Secondary | ICD-10-CM

## 2020-05-05 LAB — BASIC METABOLIC PANEL
Anion gap: 10 (ref 5–15)
BUN: 15 mg/dL (ref 8–23)
CO2: 19 mmol/L — ABNORMAL LOW (ref 22–32)
Calcium: 8.5 mg/dL — ABNORMAL LOW (ref 8.9–10.3)
Chloride: 106 mmol/L (ref 98–111)
Creatinine, Ser: 0.76 mg/dL (ref 0.44–1.00)
GFR, Estimated: >60 mL/min (ref >60)
Glucose, Bld: 145 mg/dL — ABNORMAL HIGH (ref 70–99)
Potassium: 3.8 mmol/L (ref 3.5–5.1)
Sodium: 135 mmol/L (ref 135–145)

## 2020-05-05 LAB — MAGNESIUM: Magnesium: 2 mg/dL (ref 1.7–2.4)

## 2020-05-05 LAB — CBC
HCT: 34.8 % — ABNORMAL LOW (ref 36.0–46.0)
Hemoglobin: 10.2 g/dL — ABNORMAL LOW (ref 12.0–15.0)
MCH: 23 pg — ABNORMAL LOW (ref 26.0–34.0)
MCHC: 29.3 g/dL — ABNORMAL LOW (ref 30.0–36.0)
MCV: 78.6 fL — ABNORMAL LOW (ref 80.0–100.0)
Platelets: 335 10*3/uL (ref 150–400)
RBC: 4.43 MIL/uL (ref 3.87–5.11)
RDW: 17.2 % — ABNORMAL HIGH (ref 11.5–15.5)
WBC: 16.2 10*3/uL — ABNORMAL HIGH (ref 4.0–10.5)
nRBC: 0 % (ref 0.0–0.2)

## 2020-05-05 LAB — GLUCOSE, CAPILLARY
Glucose-Capillary: 163 mg/dL — ABNORMAL HIGH (ref 70–99)
Glucose-Capillary: 245 mg/dL — ABNORMAL HIGH (ref 70–99)
Glucose-Capillary: 179 mg/dL — ABNORMAL HIGH (ref 70–99)
Glucose-Capillary: 156 mg/dL — ABNORMAL HIGH (ref 70–99)

## 2020-05-05 LAB — TROPONIN I (HIGH SENSITIVITY)
Troponin I (High Sensitivity): 68 ng/L — ABNORMAL HIGH (ref <18)
Troponin I (High Sensitivity): 63 ng/L — ABNORMAL HIGH (ref <18)
Troponin I (High Sensitivity): 73 ng/L — ABNORMAL HIGH (ref <18)

## 2020-05-05 IMAGING — DX DG CHEST 1V PORT
1 series · 1 of 1 positions shown · non-contrast
Comparison: [DATE]

CLINICAL DATA: Hypoxia

EXAM:
PORTABLE CHEST 1 VIEW

[chest]
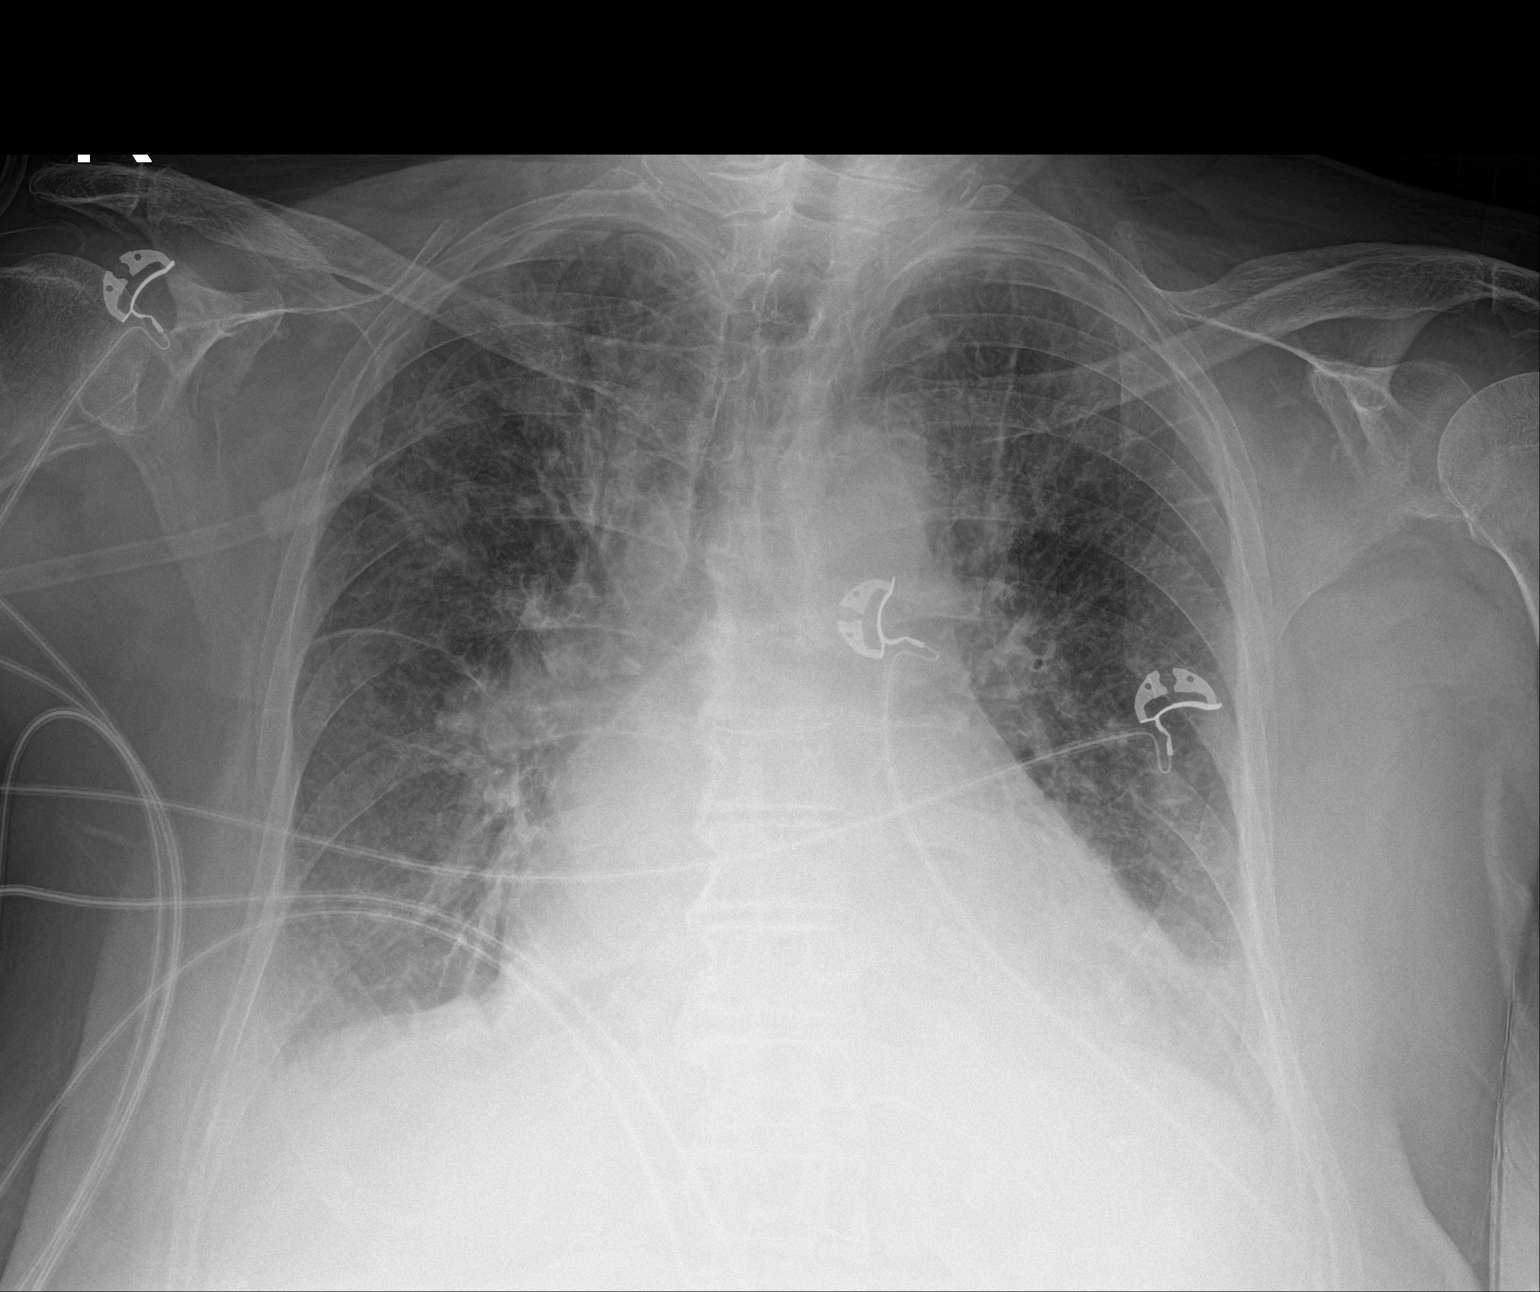

[1 of 1 positions shown; findings below may reference images not displayed]

FINDINGS: Cardiomegaly and vascular pedicle widening. Diffuse hazy and
interstitial opacity with small pleural effusions. Negative for
pneumothorax.
IMPRESSION: CHF pattern with mild improvement 2 days ago.

## 2020-05-05 MED ORDER — POTASSIUM CHLORIDE 10 MEQ/100ML IV SOLN
10.0000 meq | INTRAVENOUS | Status: AC
  Administered 2020-05-05 (×2): 10 meq via INTRAVENOUS
  Filled 2020-05-05 (×2): qty 100

## 2020-05-05 MED ORDER — ORAL CARE MOUTH RINSE
15.0000 mL | Freq: Two times a day (BID) | OROMUCOSAL | Status: DC
  Administered 2020-05-05 – 2020-05-15 (×21): 15 mL via OROMUCOSAL

## 2020-05-05 MED ORDER — FUROSEMIDE 10 MG/ML IJ SOLN
40.0000 mg | Freq: Once | INTRAMUSCULAR | Status: AC
  Administered 2020-05-05: 40 mg via INTRAVENOUS
  Filled 2020-05-05: qty 4

## 2020-05-05 MED ORDER — IPRATROPIUM BROMIDE 0.06 % NA SOLN
1.0000 | Freq: Two times a day (BID) | NASAL | Status: DC
  Administered 2020-05-05 – 2020-05-15 (×18): 1 via NASAL
  Filled 2020-05-05: qty 15

## 2020-05-05 MED ORDER — NYSTATIN 100000 UNIT/GM EX POWD
Freq: Two times a day (BID) | CUTANEOUS | Status: DC
  Filled 2020-05-05: qty 15

## 2020-05-05 MED ORDER — AMIODARONE LOAD VIA INFUSION
150.0000 mg | Freq: Once | INTRAVENOUS | Status: AC
  Administered 2020-05-05: 150 mg via INTRAVENOUS
  Filled 2020-05-05: qty 83.34

## 2020-05-05 MED ORDER — AMIODARONE IV BOLUS ONLY 150 MG/100ML: 150.0000 mg | Freq: Once | INTRAVENOUS | Status: DC

## 2020-05-05 MED ORDER — DILTIAZEM HCL ER COATED BEADS 120 MG PO CP24
120.0000 mg | ORAL_CAPSULE | Freq: Every day | ORAL | Status: DC
  Filled 2020-05-05: qty 1

## 2020-05-05 MED ORDER — IPRATROPIUM BROMIDE 0.03 % NA SOLN
2.0000 | Freq: Two times a day (BID) | NASAL | Status: DC
  Filled 2020-05-05: qty 30

## 2020-05-05 MED ORDER — DILTIAZEM HCL 30 MG PO TABS
90.0000 mg | ORAL_TABLET | Freq: Four times a day (QID) | ORAL | Status: DC
  Administered 2020-05-05 – 2020-05-07 (×7): 90 mg via ORAL
  Filled 2020-05-05: qty 1
  Filled 2020-05-05: qty 3
  Filled 2020-05-05 (×2): qty 1
  Filled 2020-05-05: qty 3
  Filled 2020-05-05 (×3): qty 1

## 2020-05-05 MED ORDER — DILTIAZEM HCL 25 MG/5ML IV SOLN
10.0000 mg | Freq: Once | INTRAVENOUS | Status: AC
  Administered 2020-05-05: 10 mg via INTRAVENOUS
  Filled 2020-05-05: qty 5

## 2020-05-05 NOTE — Progress Notes (Signed)
PT Cancellation Note  Patient Details Name: PETRINA MELBY MRN: [id number] DOB: 1940/12/26   Cancelled Treatment:    Reason Eval/Treat Not Completed: Patient not medically ready. MD requesting to hold until HR < 100. Will continue to follow and evaluate when appropriate.   Karma Ganja, PT, DPT   Acute Rehabilitation Department Pager #: [phone]  Otho Bellows 05/05/2020, 9:06 AM

## 2020-05-05 NOTE — Progress Notes (Addendum)
PCCM INTERVAL PROGRESS NOTE  Called to bedside to evaluate patient due to rapid heart rate and dyspnea. She has ah istory of AF with RVR, which has again been an issue this admission. She is on diltiazem and Xarelto at home. Diltiazem being held currently due to hypotension. She was started on amiodarone infusion with initial benefit, but now she is getting quite tachycardic again despite amio infusion. Amiodarone bolus was ordered by EMD without much improvement. HR as high as 190. Accompanied by shortness of breath and chest pain. EKG without obvious ischemia. Troponin pending. Electrolytes being replaced. CXR stable from prior.   Plan: BP improved and now off pressors. SBP consistently in 150s above goal 120-140. Will try to bolus diltiazem 10mg  as she has been off her home dosing. Hold off on additional IVF as she appears congested on CXR.    Update: HR improved with 10mg  diltiazem Will re-order home dosing for the morning at reduced dose with respect to post CVA BP goals.    Georgann Housekeeper, AGACNP-BC [place]  See Amion for personal pager PCCM on call pager [phone]  05/05/2020 4:14 AM

## 2020-05-05 NOTE — Progress Notes (Signed)
[facility] Progress Note Patient Name: Sheila Ray DOB: 08/05/1940 MRN: [id number]   Date of Service  05/05/2020  HPI/Events of Note  EKG reveals atrial fibrillation with rapid ventricular response. Low voltage QRS. Inferior infarct , age undetermined. Cannot rule out Anteroseptal infarct , age undetermined.   eICU Interventions  Plan: 1. Heparin IV infusion per Pharmacy consult if OK with Neurology.      Intervention Category Major Interventions: Other:  Alven Alverio Cornelia Copa 05/05/2020, 4:24 AM

## 2020-05-05 NOTE — Progress Notes (Signed)
[facility] Progress Note Patient Name: Sheila Ray DOB: July 15, 1940 MRN: [id number]   Date of Service  05/05/2020  HPI/Events of Note  Troponin #1 = 68. Given risk of hemorrhagic conversion of ischemic stroke will hold off on heparin IV infusion pending Troponin #2. If Troponin #2  Is continues to climb, may need to start Heparin IV infusion per pharmacy consult. Discussed with Neurology.   eICU Interventions  See discussion above.      Intervention Category Major Interventions: Other:  Bartow Zylstra Cornelia Copa 05/05/2020, 6:28 AM

## 2020-05-05 NOTE — Consult Note (Addendum)
Cardiology Consultation:   Patient ID: Sheila Ray; [id number]; 18-Jan-1941   Admit date: 05/03/2020 Date of Consult: 05/05/2020  Primary Care Provider: Sharion Balloon, FNP Primary Cardiologist: Trula Slade, MD Primary Electrophysiologist:  none   Patient Profile:   Sheila Ray is a 79 y.o. female with a hx of atrial fibrillation (Xerelto), HLD, HTN, CVA (2014), hypothyroidism, DM,  who is being seen today for the evaluation of atrial fibrillation at the request of Dr. Erlinda Hong.  History of Present Illness:   [name] presented to [facility] on 11/03 after being evaluated at an urgent care for palpitations secondary to afib with RVR and developed confusion after receiving clonidine and labealol.  Patient was found to have mild right leg drift, right facial droop, mild aphasia endorse orientation with CT scanning findings consistent with a left MCA infarct and is now status post thrombectomy.  Patient was noted to have heart rates in the 190s with soft blood pressures and was started on IV amiodarone, diltiazem, and as needed metoprolol with improvement of her heart rate to the low 100s.  Patient has a difficult time communicating symptoms but states that she has some mild chest pain.  Troponins elevated at 63 and then 73 on repeat.  Past Medical History:  Diagnosis Date  . Arthritis   . Atrial fibrillation ([place])   . Complication of anesthesia   . Diabetes mellitus without complication ([place])   . Diverticulosis   . Fatigue   . Gastric polyp   . Goiter   . Hyperlipidemia   . Hypertension   . Hypothyroid    taken off of thyroid medication 2012014   . Obese   . Osteopenia   . PONV (postoperative nausea and vomiting)   . Postmenopausal   . Rosacea   . Stroke ([place]) 01/15/2013   left thalamic  stroke, small vessel  . Vitamin D deficiency     Past Surgical History:  Procedure Laterality Date  . ABDOMINAL HYSTERECTOMY  1985  . FOOT SURGERY Right 08/2002  . RADIOLOGY WITH ANESTHESIA  N/A 05/03/2020   Procedure: IR WITH ANESTHESIA;  Surgeon: Radiologist, Medication, MD;  Location: [facility];  Service: Radiology;  Laterality: N/A;  . right knee arthroscopy   2013   . TONSILLECTOMY    . 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   Procedure: RIGHT TOTAL KNEE ARTHROPLASTY;  Surgeon: Gearlean Alf, MD;  Location: [facility];  Service: Orthopedics;  Laterality: Right;     Home Medications:  Prior to Admission medications   Medication Sig Start Date End Date Taking? Authorizing Provider  alendronate (FOSAMAX) 70 MG tablet TAKE 1 TABLET BY MOUTH  WEEKLY AS DIRECTED Patient taking differently: Take 70 mg by mouth once a week.  03/15/19  Yes Hawks, Christy A, FNP  Cyanocobalamin (VITAMIN B12) 1000 MCG TBCR Take 1,000 mcg by mouth daily.    Yes [provider]  lisinopril (ZESTRIL) 40 MG tablet TAKE 1 TABLET BY MOUTH  DAILY Patient taking differently: Take 40 mg by mouth daily.  03/18/19  Yes Hawks, Christy A, FNP  Magnesium Oxide (MAG-OX 400 PO) Take 400 mg by mouth daily.   Yes [provider]  metFORMIN (GLUCOPHAGE) 1000 MG tablet Take 0.5 tablets (500 mg total) by mouth daily with breakfast. 12/16/19  Yes Hawks, Christy A, FNP  metoprolol succinate (TOPROL-XL) 50 MG 24 hr tablet TAKE 1 TABLET BY MOUTH  DAILY WITH OR IMMEDIATELY  FOLLOWING A MEAL Patient taking differently: Take 37.5 mg  by mouth in the morning and at bedtime.  02/16/20  Yes Hawks, Christy A, FNP  XARELTO 20 MG TABS tablet TAKE 1 TABLET BY MOUTH  DAILY Patient taking differently: Take 20 mg by mouth daily with supper.  12/03/18  Yes Hawks, Christy A, FNP  diltiazem (CARDIZEM CD) 240 MG 24 hr capsule TAKE 1 CAPSULE BY MOUTH  DAILY Patient taking differently: Take 240 mg by mouth daily.  03/18/19   [name] A, FNP  glucose blood (ONE TOUCH ULTRA TEST) test strip USE TO CHECK BLOOD GLUCOSE  ONCE A DAY AS INSTRUCTED 12/16/19   Sharion Balloon, FNP  [facility] DELICA LANCETS 31V MISC 1 each by Does not apply  route daily. Use to check BG daily 09/12/17   Sharion Balloon, FNP    Inpatient Medications: Scheduled Meds: .  stroke: mapping our early stages of recovery book   Does not apply Once  . aspirin EC  325 mg Oral Daily  . Chlorhexidine Gluconate Cloth  6 each Topical Daily  . diltiazem  90 mg Oral Q6H  . ezetimibe  10 mg Oral Daily  . heparin  5,000 Units Subcutaneous Q8H  . insulin aspart  0-6 Units Subcutaneous Q4H  . mouth rinse  15 mL Mouth Rinse BID  . nystatin   Topical BID   Continuous Infusions: . sodium chloride Stopped (05/03/20 2315)  . amiodarone 30 mg/hr (05/05/20 1200)   PRN Meds: acetaminophen **OR** acetaminophen (TYLENOL) oral liquid 160 mg/5 mL **OR** acetaminophen, metoprolol tartrate, prochlorperazine, senna-docusate  Allergies:    Allergies  Allergen Reactions  . Crestor [Rosuvastatin] Other (See Comments)    Cramps  . Lipitor [Atorvastatin] Other (See Comments)    Cramps  . Pravastatin Other (See Comments)    Cramps   . Keflex [Cephalexin] Hives    Social History:   Social History   Socioeconomic History  . Marital status: Divorced    Spouse name: Not on file  . Number of children: 1  . Years of education: 48  . Highest education level: 12th grade  Occupational History  . Occupation: retired  Tobacco Use  . Smoking status: Never Smoker  . Smokeless tobacco: Never Used  Vaping Use  . Vaping Use: Never used  Substance and Sexual Activity  . Alcohol use: No  . Drug use: No  . Sexual activity: Not Currently  Other Topics Concern  . Not on file  Social History Narrative  . Not on file   Social Determinants of Health   Financial Resource Strain: Low Risk   . Difficulty of Paying Living Expenses: Not hard at all  Food Insecurity: No Food Insecurity  . Worried About Charity fundraiser in the Last Year: Never true  . Ran Out of Food in the Last Year: Never true  Transportation Needs: No Transportation Needs  . Lack of Transportation  (Medical): No  . Lack of Transportation (Non-Medical): No  Physical Activity: Insufficiently Active  . Days of Exercise per Week: 2 days  . Minutes of Exercise per Session: 20 min  Stress: No Stress Concern Present  . Feeling of Stress : Not at all  Social Connections: Moderately Integrated  . Frequency of Communication with Friends and Family: More than three times a week  . Frequency of Social Gatherings with Friends and Family: More than three times a week  . Attends Religious Services: More than 4 times per year  . Active Member of Clubs or Organizations: Yes  . Attends  Club or Organization Meetings: More than 4 times per year  . Marital Status: Divorced  Human resources officer Violence: Not At Risk  . Fear of Current or Ex-Partner: No  . Emotionally Abused: No  . Physically Abused: No  . Sexually Abused: No    Family History:   Family History  Problem Relation Age of Onset  . Osteoporosis Mother   . Alzheimer's disease Mother 45  . Arthritis Mother   . Cancer Father        Lung  . Arthritis Sister   . Obesity Sister   . Heart attack Brother 62  . Heart disease Son   . Arthritis Brother   . Arthritis Sister   . Cancer Sister        breast cancer     ROS:  Please see the history of present illness.  ROS  All other ROS reviewed and negative.     Physical Exam/Data:   Vitals:   05/05/20 1030 05/05/20 1100 05/05/20 1130 05/05/20 1200  BP: (!) 139/98 (!) 142/93 (!) 129/108 128/89  Pulse: (!) 106 (!) 118 (!) 102 (!) 124  Resp: (!) 27 (!) 27 (!) 30 (!) 27  Temp:      TempSrc:      SpO2: 94% 95% 95% 95%    Intake/Output Summary (Last 24 hours) at 05/05/2020 1308 Last data filed at 05/05/2020 1200 Gross per 24 hour  Intake 885.67 ml  Output 50 ml  Net 835.67 ml   There were no vitals filed for this visit. There is no height or weight on file to calculate BMI.  Physical Exam Constitutional:      General: She is not in acute distress.    Appearance: She is not  ill-appearing.  HENT:     Head: Normocephalic and atraumatic.  Cardiovascular:     Rate and Rhythm: Tachycardia present. Rhythm irregular.     Pulses: Normal pulses.     Heart sounds: No murmur heard.  No friction rub. No gallop.   Pulmonary:     Effort: Pulmonary effort is normal. No respiratory distress.     Breath sounds: Normal breath sounds.  Abdominal:     General: Abdomen is flat. There is no distension.  Musculoskeletal:        General: Swelling (right upper extremity due to inflitrated IV) present.     Right lower leg: No edema.     Left lower leg: No edema.  Skin:    General: Skin is warm and dry.  Neurological:     Mental Status: She is alert.     Cranial Nerves: Dysarthria and facial asymmetry present.     Motor: Weakness present.      EKG:  The EKG was personally reviewed and demonstrates: irregularly irregular rhythm with no P waves. No evidence of ischemia. Telemetry:  Telemetry was personally reviewed and demonstrates:  Similar.  Relevant CV Studies: Echocardiogram (05/05/20) 1. Left ventricular ejection fraction, by estimation, is 55 to 60%. The  left ventricle has normal function. The left ventricle has no regional  wall motion abnormalities. There is moderate concentric left ventricular  hypertrophy. Left ventricular  diastolic function could not be evaluated.  2. Right ventricular systolic function is mildly reduced. The right  ventricular size is moderately enlarged. There is moderately elevated  pulmonary artery systolic pressure. The estimated right ventricular  systolic pressure is 26.3 mmHg.  3. Left atrial size was moderately dilated.  4. Right atrial size was severely dilated.  5.  A small pericardial effusion is present. The pericardial effusion is  posterior to the left ventricle. Large pleural effusion in the left  lateral region.  6. The mitral valve is normal in structure. Moderate mitral valve  regurgitation. No evidence of mitral  stenosis.  7. Tricuspid valve regurgitation is moderate to severe.  8. The aortic valve is normal in structure. Aortic valve regurgitation is  trivial. No aortic stenosis is present.  9. The inferior vena cava is normal in size with greater than 50%  respiratory variability, suggesting right atrial pressure of 3 mmHg.   Conclusion(s)/Recommendation(s): No intracardiac source of embolism  detected on this transthoracic study. A transesophageal echocardiogram is  recommended to exclude cardiac source of embolism if clinically indicated.   Laboratory Data:  Chemistry Recent Labs  Lab 05/03/20 1317 05/03/20 1317 05/03/20 1320 05/04/20 0317 05/05/20 0051  NA 131*   < > 136 136 135  K 3.7   < > 3.8 4.2 3.8  CL 102   < > 101 107 106  CO2 22  --   --  17* 19*  GLUCOSE 132*   < > 127* 194* 145*  BUN 11   < > 9 12 15   CREATININE 0.64   < > 0.60 0.82 0.76  CALCIUM 8.6*  --   --  8.8* 8.5*  GFRNONAA >60  --   --  >60 >60  ANIONGAP 7  --   --  12 10   < > = values in this interval not displayed.    Recent Labs  Lab 05/03/20 1317  PROT 5.7*  ALBUMIN 3.0*  AST 15  ALT 12  ALKPHOS 52  BILITOT 0.5   Hematology Recent Labs  Lab 05/03/20 1813 05/04/20 0317 05/05/20 0051  WBC 15.6* 19.2* 16.2*  RBC 4.58 4.54 4.43  HGB 10.6* 10.9* 10.2*  HCT 36.0 37.4 34.8*  MCV 78.6* 82.4 78.6*  MCH 23.1* 24.0* 23.0*  MCHC 29.4* 29.1* 29.3*  RDW 16.9* 17.2* 17.2*  PLT 414* 407* 335   Cardiac EnzymesNo results for input(s): TROPONINI in the last 168 hours. No results for input(s): TROPIPOC in the last 168 hours.  BNPNo results for input(s): BNP, PROBNP in the last 168 hours.  DDimer No results for input(s): DDIMER in the last 168 hours.  Radiology/Studies:  EEG  Result Date: 05/04/2020 Lora Havens, MD     05/04/2020  1:46 PM Patient Name: Sheila Ray MRN: [id number] Epilepsy Attending: Lora Havens Referring Physician/Provider: Dr Kerney Elbe Date: 05/04/2020 Duration:  23.54 mins Patient history: 79 year old female with left MCA stroke s/p revascularization, with AMS. EEG to evaluate for seizure. Level of alertness: Awake AEDs during EEG study: None Technical aspects: This EEG study was done with scalp electrodes positioned according to the 10-20 International system of electrode placement. Electrical activity was acquired at a sampling rate of 500Hz  and reviewed with a high frequency filter of 70Hz  and a low frequency filter of 1Hz . EEG data were recorded continuously and digitally stored. Description: The posterior dominant rhythm consists of 9 Hz activity of moderate voltage (25-35 uV) seen predominantly in posterior head regions, asymmetric ( L<R) and reactive to eye opening and eye closing.  EEG showed continuous 3 to 5 Hz theta-delta slowing.  Left hemisphere, maximal left temporal region which at times appeared sharply contoured.  Hyperventilation and photic stimulation were not performed.   ABNORMALITY -Continuous slow, lateralized left hemisphere, maximal left temporal region -Background asymmetry, left<right IMPRESSION: This study  is suggestive of cortical dysfunction in left hemisphere, maximal left temporal region consistent with underlying stroke.  No seizures or definite epileptiform discharges were seen throughout the recording. If suspicion for interictal activity remains a concern, a prolonged study can be considered. Lora Havens   CT Angio Head W or Wo Contrast  Addendum Date: 05/03/2020   ADDENDUM REPORT: 05/03/2020 14:38 ADDENDUM: Exam and technique should not include CT perfusion, which was performed subsequently and dictated separately. Only CT head without contrast and CT angiography of the head and neck with contrast performed. Electronically Signed   By: Macy Mis M.D.   On: 05/03/2020 14:38   Result Date: 05/03/2020 CLINICAL DATA:  Code stroke.  Right-sided weakness EXAM: CT HEAD WITHOUT CONTRAST CT ANGIOGRAPHY HEAD AND NECK CT PERFUSION  BRAIN TECHNIQUE: Contiguous axial images were obtained the skull base to the vertex without contrast. Multidetector CT imaging of the head and neck was performed using the standard protocol during bolus administration of intravenous contrast. Multiplanar CT image reconstructions and MIPs were obtained to evaluate the vascular anatomy. Carotid stenosis measurements (when applicable) are obtained utilizing NASCET criteria, using the distal internal carotid diameter as the denominator. Multiphase CT imaging of the brain was performed following IV bolus contrast injection. Subsequent parametric perfusion maps were calculated using RAPID software. CONTRAST:  87mL OMNIPAQUE IOHEXOL 350 MG/ML SOLN COMPARISON:  None. FINDINGS: CT HEAD Brain: There is no acute intracranial hemorrhage, mass effect, or edema. Gray-white differentiation is preserved. Patchy and confluent areas hypoattenuation in the supratentorial white but may reflect moderate chronic microvascular ischemic changes. There is no extra-axial fluid collection. Prominence of the ventricles and sulci reflects generalized parenchymal volume loss. Vascular: No hyperdense vessel. Skull: Calvarium is unremarkable. Sinuses/Orbits: No acute finding. Other: None. ASPECTS ([place] Stroke Program Early CT Score) - Ganglionic level infarction (caudate, lentiform nuclei, internal capsule, insula, M1-M3 cortex): 7 - Supraganglionic infarction (M4-M6 cortex): 3 Total score (0-10 with 10 being normal): 10 CTA NECK Aortic arch: Great vessel origins are patent. Right carotid system: Patent. Mild calcified plaque at the ICA origin causing minimal stenosis. Left carotid system: Patent. Minimal calcified plaque at the proximal ICA without measurable stenosis. Vertebral arteries: Patent.  Right vertebral artery is dominant. Skeleton: Degenerative changes of the cervical spine primarily C6-C7. Other neck: Heterogeneous, multinodular thyroid. Further ultrasound follow-up is not  recommended by current guidelines. Upper chest: Partially imaged small bilateral pleural effusions. Peribronchial and interstitial thickening likely reflecting interstitial edema. Review of the MIP images confirms the above findings CTA HEAD Anterior circulation: Intracranial internal carotid arteries are patent with mild calcified plaque. Left M1 MCA is patent. There is occlusion of a M2 MCA branch approximately 13 mm from the MCA trifurcation (see saved key image series 11, 27). There is partial reconstitution followed by occlusion and subsequent distal M3 reconstitution. Right middle and both anterior cerebral arteries are patent. Posterior circulation: Intracranial vertebral arteries are patent with minimal calcified plaque on the right. Basilar artery is patent. Posterior cerebral arteries are patent. There are bilateral posterior communicating arteries present. Venous sinuses: Patent as allowed by contrast bolus timing. Review of the MIP images confirms the above findings IMPRESSION: No acute intracranial hemorrhage. No acute infarction identified. ASPECT score is 10. 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 No hemodynamically significant stenosis the neck. Partially imaged mild interstitial pulmonary edema and small bilateral pleural effusions. Results were called by telephone at the time of interpretation on 05/03/2020 at 1:09 pm and again at  1:17 p.m. to provider Gerlene Fee , who verbally acknowledged these results. Electronically Signed: By: Macy Mis M.D. On: 05/03/2020 13:32   CT Angio Neck W and/or Wo Contrast  Addendum Date: 05/03/2020   ADDENDUM REPORT: 05/03/2020 14:38 ADDENDUM: Exam and technique should not include CT perfusion, which was performed subsequently and dictated separately. Only CT head without contrast and CT angiography of the head and neck with contrast performed. Electronically Signed   By: Macy Mis M.D.   On: 05/03/2020 14:38   Result  Date: 05/03/2020 CLINICAL DATA:  Code stroke.  Right-sided weakness EXAM: CT HEAD WITHOUT CONTRAST CT ANGIOGRAPHY HEAD AND NECK CT PERFUSION BRAIN TECHNIQUE: Contiguous axial images were obtained the skull base to the vertex without contrast. Multidetector CT imaging of the head and neck was performed using the standard protocol during bolus administration of intravenous contrast. Multiplanar CT image reconstructions and MIPs were obtained to evaluate the vascular anatomy. Carotid stenosis measurements (when applicable) are obtained utilizing NASCET criteria, using the distal internal carotid diameter as the denominator. Multiphase CT imaging of the brain was performed following IV bolus contrast injection. Subsequent parametric perfusion maps were calculated using RAPID software. CONTRAST:  55mL OMNIPAQUE IOHEXOL 350 MG/ML SOLN COMPARISON:  None. FINDINGS: CT HEAD Brain: There is no acute intracranial hemorrhage, mass effect, or edema. Gray-white differentiation is preserved. Patchy and confluent areas hypoattenuation in the supratentorial white but may reflect moderate chronic microvascular ischemic changes. There is no extra-axial fluid collection. Prominence of the ventricles and sulci reflects generalized parenchymal volume loss. Vascular: No hyperdense vessel. Skull: Calvarium is unremarkable. Sinuses/Orbits: No acute finding. Other: None. ASPECTS ([place] Stroke Program Early CT Score) - Ganglionic level infarction (caudate, lentiform nuclei, internal capsule, insula, M1-M3 cortex): 7 - Supraganglionic infarction (M4-M6 cortex): 3 Total score (0-10 with 10 being normal): 10 CTA NECK Aortic arch: Great vessel origins are patent. Right carotid system: Patent. Mild calcified plaque at the ICA origin causing minimal stenosis. Left carotid system: Patent. Minimal calcified plaque at the proximal ICA without measurable stenosis. Vertebral arteries: Patent.  Right vertebral artery is dominant. Skeleton: Degenerative  changes of the cervical spine primarily C6-C7. Other neck: Heterogeneous, multinodular thyroid. Further ultrasound follow-up is not recommended by current guidelines. Upper chest: Partially imaged small bilateral pleural effusions. Peribronchial and interstitial thickening likely reflecting interstitial edema. Review of the MIP images confirms the above findings CTA HEAD Anterior circulation: Intracranial internal carotid arteries are patent with mild calcified plaque. Left M1 MCA is patent. There is occlusion of a M2 MCA branch approximately 13 mm from the MCA trifurcation (see saved key image series 11, 27). There is partial reconstitution followed by occlusion and subsequent distal M3 reconstitution. Right middle and both anterior cerebral arteries are patent. Posterior circulation: Intracranial vertebral arteries are patent with minimal calcified plaque on the right. Basilar artery is patent. Posterior cerebral arteries are patent. There are bilateral posterior communicating arteries present. Venous sinuses: Patent as allowed by contrast bolus timing. Review of the MIP images confirms the above findings IMPRESSION: No acute intracranial hemorrhage. No acute infarction identified. ASPECT score is 10. 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 No hemodynamically significant stenosis the neck. Partially imaged mild interstitial pulmonary edema and small bilateral pleural effusions. Results were called by telephone at the time of interpretation on 05/03/2020 at 1:09 pm and again at 1:17 p.m. to provider [facility] , who verbally acknowledged these results. Electronically Signed: By: Macy Mis M.D. On: 05/03/2020 13:32  MR ANGIO HEAD WO CONTRAST  Result Date: 05/04/2020 CLINICAL DATA:  Stroke, follow-up. EXAM: MRI HEAD WITHOUT CONTRAST MRA HEAD WITHOUT CONTRAST TECHNIQUE: Multiplanar, multiecho pulse sequences of the brain and surrounding structures were obtained without  intravenous contrast. Angiographic images of the head were obtained using MRA technique without contrast. COMPARISON:  CT/CT angiogram of the head and neck May 03, 2020. FINDINGS: MRI HEAD FINDINGS Brain: Cortical restricted diffusion is seen within the left temporal occipital region. Scattered foci of restricted diffusion are also seen in the left frontoparietal and insular regions and left amygdala. Focus of susceptibility artifact in the left temporal region may represent residual clot in a distal branch versus small focus of hemorrhage. No hemorrhagic transformation, significant mass effect or midline shift. No hydrocephalus or mass lesion. Scattered and confluent foci of T2 hyperintensity are seen within the white matter of the cerebral hemispheres, nonspecific, most likely related to chronic small vessel ischemia. 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 Moderate parenchymal volume loss. Vascular: Normal flow voids. Skull and upper cervical spine: Normal marrow signal. Sinuses/Orbits: Mild mucosal thickening of the ethmoid cells. The orbits are maintained. MRA HEAD FINDINGS The study is partially degraded by motion. Normal flow related enhancement is seen within the upper cervical and intracranial segment of the bilateral ICAs. Moderate stenosis of the right carotid terminus is likely artifactual given no stenosis on recent CTA. The bilateral M1-M2/M segments and bilateral A1-A2/ACA segments have normal flow related enhancement. Hypoplastic left vertebral artery with a dominant right vertebral artery with normal flow related enhancement. The basilar artery and bilateral posterior cerebral arteries are also patent. Luminal irregularity along the bilateral PCAs is suggestive of intracranial atherosclerotic disease. No proximal occlusion or stenosis, aneurysm or vascular malformation. IMPRESSION: 1. Cortical restricted diffusion within the left temporal occipital region. Scattered foci of restricted  diffusion are also seen in the left frontoparietal and insular regions and left amygdala. Findings are consistent with acute infarcts. No hemorrhagic transformation, significant mass effect or midline shift. 2. No proximal occlusion or stenosis in the anterior or posterior circulation. Results communicated to Dr. Lottie Rater via [facility] pager system. Electronically Signed   By: Pedro Earls M.D.   On: 05/04/2020 14:19   MR ANGIO HEAD WO CONTRAST  Result Date: 05/03/2020 CLINICAL DATA:  Follow-up examination for acute stroke. Patient with previously identified left M2 branch occlusion, superior division. Status post catheter directed intervention achieving TICI2C revascularization. Recanalized A2/A3 filling defect also noted on arteriogram. EXAM: MRI HEAD WITHOUT CONTRAST MRA HEAD WITHOUT CONTRAST TECHNIQUE: Multiplanar, multiecho pulse sequences of the brain and surrounding structures were obtained without intravenous contrast. Angiographic images of the head were obtained using MRA technique without contrast. COMPARISON:  Prior CTs from earlier the same day. FINDINGS: MRI HEAD FINDINGS Brain: Examination mildly degraded by motion artifact. Generalized age-related cerebral atrophy. Patchy and confluent T2/FLAIR hyperintensity within the periventricular, deep, and subcortical white matter both cerebral hemispheres, nonspecific, but most like related chronic microvascular ischemic disease, moderate in nature. Superimposed remote lacunar infarct at the left corona radiata/basal ganglia. Patchy areas of restricted diffusion seen involving the cortical gray matter of the left insula, left temporal occipital region, and mesial left temporal lobe are seen, consistent with small volume acute ischemic infarcts (series 5, images 70, 69, 67). These are predominantly left MCA distribution. Additional patchy small volume cortical infarcts noted at the high left frontoparietal region (series 5, images 91, 85),  left ACA and MCA distribution. No associated mass effect. Single  small focus of associated petechial hemorrhage present at the left temporal region (series 18, image 27). No other evidence for acute or subacute ischemia. Gray-white matter differentiation otherwise maintained. No mass lesion, mass effect, or midline shift. No hydrocephalus or extra-axial fluid collection. Pituitary gland and suprasellar region within normal limits. Vascular: Major intracranial vascular flow voids are maintained. Skull and upper cervical spine: Craniocervical junction within normal limits. Bone marrow signal intensity normal. No scalp soft tissue abnormality. Sinuses/Orbits: Globes and orbital soft tissues within normal limits. Paranasal sinuses are clear. No mastoid effusion. Other: None. MRA HEAD FINDINGS ANTERIOR CIRCULATION: Examination degraded by motion artifact. Visualized distal cervical segments of the internal carotid arteries are grossly patent with antegrade flow. Apparent short-segment defect at the distal cervical left ICA felt to be due to motion artifact, as no stenosis or other defect seen within this region on corresponding arteriogram. Petrous, cavernous, and supraclinoid segments of both internal carotid arteries patent without appreciable stenosis or other abnormality. A1 segments patent bilaterally. Grossly normal anterior communicating artery complex. Partially visualized ACAs perfused to their distal aspects without appreciable stenosis. M1 segments patent bilaterally. Left MCA trifurcations. Previously identified occluded left M2 branch appears grossly patent status post revascularization. MCA branches otherwise well perfused bilaterally. POSTERIOR CIRCULATION: Right vertebral artery strongly dominant and patent to the vertebrobasilar junction. Right PICA not definitely seen. Left vertebral artery hypoplastic and largely terminates in PICA, although a small branch seen ascending towards the vertebrobasilar  junction. Left PICA patent proximally. Basilar diffusely irregular but is patent to its distal aspect without high-grade stenosis. Superior cerebral arteries grossly patent proximally. Both PCAs are patent proximally, not well assessed distally due to motion. No intracranial aneurysm. IMPRESSION: MRI HEAD IMPRESSION: 1. 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 Associated minimal petechial hemorrhage at the left temporal region without significant mass effect. 2. Underlying age-related cerebral atrophy with moderate chronic microvascular ischemic disease. MRA HEAD IMPRESSION: 1. Technically limited exam due to motion artifact. 2. Previously identified occluded left M2 branch appears grossly patent status post revascularization. 3. Otherwise grossly stable and negative intracranial MRA. No other large vessel occlusion. No other hemodynamically significant or correctable stenosis. Electronically Signed   By: Jeannine Boga M.D.   On: 05/03/2020 22:06   MR BRAIN WO CONTRAST  Result Date: 05/04/2020 CLINICAL DATA:  Stroke, follow-up. EXAM: MRI HEAD WITHOUT CONTRAST MRA HEAD WITHOUT CONTRAST TECHNIQUE: Multiplanar, multiecho pulse sequences of the brain and surrounding structures were obtained without intravenous contrast. Angiographic images of the head were obtained using MRA technique without contrast. COMPARISON:  CT/CT angiogram of the head and neck May 03, 2020. FINDINGS: MRI HEAD FINDINGS Brain: Cortical restricted diffusion is seen within the left temporal occipital region. Scattered foci of restricted diffusion are also seen in the left frontoparietal and insular regions and left amygdala. Focus of susceptibility artifact in the left temporal region may represent residual clot in a distal branch versus small focus of hemorrhage. No hemorrhagic transformation, significant mass effect or midline shift. No hydrocephalus or mass  lesion. Scattered and confluent foci of T2 hyperintensity are seen within the white matter of the cerebral hemispheres, nonspecific, most likely related to chronic small vessel ischemia. 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 Moderate parenchymal volume loss. Vascular: Normal flow voids. Skull and upper cervical spine: Normal marrow signal. Sinuses/Orbits: Mild mucosal thickening of the ethmoid cells. The orbits are maintained. MRA HEAD FINDINGS The study is partially degraded by motion.  Normal flow related enhancement is seen within the upper cervical and intracranial segment of the bilateral ICAs. Moderate stenosis of the right carotid terminus is likely artifactual given no stenosis on recent CTA. The bilateral M1-M2/M segments and bilateral A1-A2/ACA segments have normal flow related enhancement. Hypoplastic left vertebral artery with a dominant right vertebral artery with normal flow related enhancement. The basilar artery and bilateral posterior cerebral arteries are also patent. Luminal irregularity along the bilateral PCAs is suggestive of intracranial atherosclerotic disease. No proximal occlusion or stenosis, aneurysm or vascular malformation. IMPRESSION: 1. Cortical restricted diffusion within the left temporal occipital region. Scattered foci of restricted diffusion are also seen in the left frontoparietal and insular regions and left amygdala. Findings are consistent with acute infarcts. No hemorrhagic transformation, significant mass effect or midline shift. 2. No proximal occlusion or stenosis in the anterior or posterior circulation. Results communicated to Dr. Lottie Rater via [facility] pager system. Electronically Signed   By: Pedro Earls M.D.   On: 05/04/2020 14:19   MR BRAIN WO CONTRAST  Result Date: 05/03/2020 CLINICAL DATA:  Follow-up examination for acute stroke. Patient with previously identified left M2 branch occlusion, superior division. Status post catheter  directed intervention achieving TICI2C revascularization. Recanalized A2/A3 filling defect also noted on arteriogram. EXAM: MRI HEAD WITHOUT CONTRAST MRA HEAD WITHOUT CONTRAST TECHNIQUE: Multiplanar, multiecho pulse sequences of the brain and surrounding structures were obtained without intravenous contrast. Angiographic images of the head were obtained using MRA technique without contrast. COMPARISON:  Prior CTs from earlier the same day. FINDINGS: MRI HEAD FINDINGS Brain: Examination mildly degraded by motion artifact. Generalized age-related cerebral atrophy. Patchy and confluent T2/FLAIR hyperintensity within the periventricular, deep, and subcortical white matter both cerebral hemispheres, nonspecific, but most like related chronic microvascular ischemic disease, moderate in nature. Superimposed remote lacunar infarct at the left corona radiata/basal ganglia. Patchy areas of restricted diffusion seen involving the cortical gray matter of the left insula, left temporal occipital region, and mesial left temporal lobe are seen, consistent with small volume acute ischemic infarcts (series 5, images 70, 69, 67). These are predominantly left MCA distribution. Additional patchy small volume cortical infarcts noted at the high left frontoparietal region (series 5, images 91, 85), left ACA and MCA distribution. No associated mass effect. Single small focus of associated petechial hemorrhage present at the left temporal region (series 18, image 27). No other evidence for acute or subacute ischemia. Gray-white matter differentiation otherwise maintained. No mass lesion, mass effect, or midline shift. No hydrocephalus or extra-axial fluid collection. Pituitary gland and suprasellar region within normal limits. Vascular: Major intracranial vascular flow voids are maintained. Skull and upper cervical spine: Craniocervical junction within normal limits. Bone marrow signal intensity normal. No scalp soft tissue abnormality.  Sinuses/Orbits: Globes and orbital soft tissues within normal limits. Paranasal sinuses are clear. No mastoid effusion. Other: None. MRA HEAD FINDINGS ANTERIOR CIRCULATION: Examination degraded by motion artifact. Visualized distal cervical segments of the internal carotid arteries are grossly patent with antegrade flow. Apparent short-segment defect at the distal cervical left ICA felt to be due to motion artifact, as no stenosis or other defect seen within this region on corresponding arteriogram. Petrous, cavernous, and supraclinoid segments of both internal carotid arteries patent without appreciable stenosis or other abnormality. A1 segments patent bilaterally. Grossly normal anterior communicating artery complex. Partially visualized ACAs perfused to their distal aspects without appreciable stenosis. M1 segments patent bilaterally. Left MCA trifurcations. Previously identified occluded left M2 branch appears grossly patent status post  revascularization. MCA branches otherwise well perfused bilaterally. POSTERIOR CIRCULATION: Right vertebral artery strongly dominant and patent to the vertebrobasilar junction. Right PICA not definitely seen. Left vertebral artery hypoplastic and largely terminates in PICA, although a small branch seen ascending towards the vertebrobasilar junction. Left PICA patent proximally. Basilar diffusely irregular but is patent to its distal aspect without high-grade stenosis. Superior cerebral arteries grossly patent proximally. Both PCAs are patent proximally, not well assessed distally due to motion. No intracranial aneurysm. IMPRESSION: MRI HEAD IMPRESSION: 1. 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 Associated minimal petechial hemorrhage at the left temporal region without significant mass effect. 2. Underlying age-related cerebral atrophy with moderate chronic microvascular ischemic disease. MRA HEAD  IMPRESSION: 1. Technically limited exam due to motion artifact. 2. Previously identified occluded left M2 branch appears grossly patent status post revascularization. 3. Otherwise grossly stable and negative intracranial MRA. No other large vessel occlusion. No other hemodynamically significant or correctable stenosis. Electronically Signed   By: Jeannine Boga M.D.   On: 05/03/2020 22:06   CT CEREBRAL PERFUSION W CONTRAST  Result Date: 05/03/2020 CLINICAL DATA:  Right-sided weakness, M2 MCA occlusion EXAM: CT PERFUSION BRAIN TECHNIQUE: Multiphase CT imaging of the brain was performed following IV bolus contrast injection. Subsequent parametric perfusion maps were calculated using RAPID software. CONTRAST:  83mL OMNIPAQUE IOHEXOL 350 MG/ML SOLN COMPARISON:  None. FINDINGS: CT Brain Perfusion Findings: CBF (<30%) Volume: 20mL Perfusion (Tmax>6.0s) volume: 2mL Mismatch Volume: 19mL ASPECTS on noncontrast CT Head: 10 at 1:02 p.m. today. Infarct Core: 0 mL Infarction Location:Territory at risk in the posterior left MCA territory IMPRESSION: No core infarction identified. 67 mL of penumbra in the posterior left MCA territory. Electronically Signed   By: Macy Mis M.D.   On: 05/03/2020 14:36   DG CHEST PORT 1 VIEW  Result Date: 05/05/2020 CLINICAL DATA:  Hypoxia EXAM: PORTABLE CHEST 1 VIEW COMPARISON:  05/03/2020 FINDINGS: Cardiomegaly and vascular pedicle widening. Diffuse hazy and interstitial opacity with small pleural effusions. Negative for pneumothorax. IMPRESSION: CHF pattern with mild improvement 2 days ago. Electronically Signed   By: Monte Fantasia M.D.   On: 05/05/2020 04:02   DG CHEST PORT 1 VIEW  Result Date: 05/03/2020 CLINICAL DATA:  Hypoxia, recent cerebral revascularization. EXAM: PORTABLE CHEST 1 VIEW COMPARISON:  02/07/2020 FINDINGS: Cardiac shadow is enlarged but stable. Diffuse vascular congestion is noted with some patchy edema particularly in the right lung. Small effusions  are noted bilaterally. No bony abnormality is seen. IMPRESSION: Vascular congestion with mild parenchymal edema on the right consistent with congestive failure. Electronically Signed   By: Inez Catalina M.D.   On: 05/03/2020 19:25   ECHOCARDIOGRAM COMPLETE  Result Date: 05/04/2020    ECHOCARDIOGRAM REPORT   Patient Name:   Sheila Ray Date of Exam: 05/04/2020 Medical Rec #:  [id number]       Height:       64.5 in Accession #:    [id number]      Weight:       175.0 lb Date of Birth:  Jun 24, 1941        BSA:          1.859 m Patient Age:    64 years        BP:           121/79 mmHg Patient Gender: F               HR:  96 bpm. Exam Location:  Inpatient Procedure: 2D Echo, Cardiac Doppler, Color Doppler and Intracardiac            Opacification Agent Indications:    CVA  History:        Patient has no prior history of Echocardiogram examinations.                 Stroke, Arrythmias:Atrial Fibrillation; Risk                 Factors:Hypertension, Diabetes, Dyslipidemia and Obesity.  Sonographer:    Dustin Flock Referring Phys: [id number] [place]  1. Left ventricular ejection fraction, by estimation, is 55 to 60%. The left ventricle has normal function. The left ventricle has no regional wall motion abnormalities. There is moderate concentric left ventricular hypertrophy. Left ventricular diastolic function could not be evaluated.  2. Right ventricular systolic function is mildly reduced. The right ventricular size is moderately enlarged. There is moderately elevated pulmonary artery systolic pressure. The estimated right ventricular systolic pressure is 94.4 mmHg.  3. Left atrial size was moderately dilated.  4. Right atrial size was severely dilated.  5. A small pericardial effusion is present. The pericardial effusion is posterior to the left ventricle. Large pleural effusion in the left lateral region.  6. The mitral valve is normal in structure. Moderate mitral valve regurgitation. No  evidence of mitral stenosis.  7. Tricuspid valve regurgitation is moderate to severe.  8. The aortic valve is normal in structure. Aortic valve regurgitation is trivial. No aortic stenosis is present.  9. The inferior vena cava is normal in size with greater than 50% respiratory variability, suggesting right atrial pressure of 3 mmHg. Conclusion(s)/Recommendation(s): No intracardiac source of embolism detected on this transthoracic study. A transesophageal echocardiogram is recommended to exclude cardiac source of embolism if clinically indicated. FINDINGS  Left Ventricle: Left ventricular ejection fraction, by estimation, is 55 to 60%. The left ventricle has normal function. The left ventricle has no regional wall motion abnormalities. Definity contrast agent was given IV to delineate the left ventricular  endocardial borders. The left ventricular internal cavity size was normal in size. There is moderate concentric left ventricular hypertrophy. Left ventricular diastolic function could not be evaluated due to atrial fibrillation. Left ventricular diastolic function could not be evaluated. Normal left ventricular filling pressure. Right Ventricle: The right ventricular size is moderately enlarged. No increase in right ventricular wall thickness. Right ventricular systolic function is mildly reduced. There is moderately elevated pulmonary artery systolic pressure. The tricuspid regurgitant velocity is 2.86 m/s, and with an assumed right atrial pressure of 15 mmHg, the estimated right ventricular systolic pressure is 96.7 mmHg. Left Atrium: Left atrial size was moderately dilated. Right Atrium: Right atrial size was severely dilated. Pericardium: A small pericardial effusion is present. The pericardial effusion is posterior to the left ventricle. Mitral Valve: The mitral valve is normal in structure. Moderate mitral valve regurgitation. No evidence of mitral valve stenosis. Tricuspid Valve: The tricuspid valve is  normal in structure. Tricuspid valve regurgitation is moderate to severe. No evidence of tricuspid stenosis. Aortic Valve: The aortic valve is normal in structure. Aortic valve regurgitation is trivial. No aortic stenosis is present. Pulmonic Valve: The pulmonic valve was normal in structure. Pulmonic valve regurgitation is not visualized. No evidence of pulmonic stenosis. Aorta: The aortic root is normal in size and structure. Venous: The inferior vena cava is normal in size with greater than 50% respiratory variability, suggesting right atrial pressure of  3 mmHg. IAS/Shunts: No atrial level shunt detected by color flow Doppler. Additional Comments: No apical thrombus on Definity echocontrast images. There is a large pleural effusion in the left lateral region.  LEFT VENTRICLE PLAX 2D LVIDd:         4.10 cm  Diastology LVIDs:         2.80 cm  LV e' medial:    5.44 cm/s LV PW:         1.30 cm  LV E/e' medial:  17.2 LV IVS:        1.20 cm  LV e' lateral:   7.94 cm/s LVOT diam:     2.00 cm  LV E/e' lateral: 11.8 LV SV:         33 LV SV Index:   18 LVOT Area:     3.14 cm  RIGHT VENTRICLE RV Basal diam:  3.30 cm RV S prime:     6.96 cm/s TAPSE (M-mode): 2.9 cm LEFT ATRIUM             Index       RIGHT ATRIUM           Index LA diam:        4.60 cm 2.48 cm/m  RA Area:     18.00 cm LA Vol (A2C):   69.7 ml 37.50 ml/m RA Volume:   47.80 ml  25.72 ml/m LA Vol (A4C):   75.7 ml 40.73 ml/m LA Biplane Vol: 74.7 ml 40.19 ml/m  AORTIC VALVE LVOT Vmax:   71.10 cm/s LVOT Vmean:  44.800 cm/s LVOT VTI:    0.105 m  AORTA Ao Root diam: 2.70 cm MITRAL VALVE               TRICUSPID VALVE MV Area (PHT): 3.77 cm    TR Peak grad:   32.7 mmHg MV Decel Time: 201 msec    TR Vmax:        286.00 cm/s MV E velocity: 93.60 cm/s                            SHUNTS                            Systemic VTI:  0.10 m                            Systemic Diam: 2.00 cm Ena Dawley MD Electronically signed by Ena Dawley MD Signature Date/Time:  05/04/2020/11:16:53 AM    Final    CT HEAD CODE STROKE WO CONTRAST  Addendum Date: 05/03/2020   ADDENDUM REPORT: 05/03/2020 14:38 ADDENDUM: Exam and technique should not include CT perfusion, which was performed subsequently and dictated separately. Only CT head without contrast and CT angiography of the head and neck with contrast performed. Electronically Signed   By: Macy Mis M.D.   On: 05/03/2020 14:38   Result Date: 05/03/2020 CLINICAL DATA:  Code stroke.  Right-sided weakness EXAM: CT HEAD WITHOUT CONTRAST CT ANGIOGRAPHY HEAD AND NECK CT PERFUSION BRAIN TECHNIQUE: Contiguous axial images were obtained the skull base to the vertex without contrast. Multidetector CT imaging of the head and neck was performed using the standard protocol during bolus administration of intravenous contrast. Multiplanar CT image reconstructions and MIPs were obtained to evaluate the vascular anatomy. Carotid stenosis measurements (when applicable) are obtained utilizing NASCET criteria, using  the distal internal carotid diameter as the denominator. Multiphase CT imaging of the brain was performed following IV bolus contrast injection. Subsequent parametric perfusion maps were calculated using RAPID software. CONTRAST:  24mL OMNIPAQUE IOHEXOL 350 MG/ML SOLN COMPARISON:  None. FINDINGS: CT HEAD Brain: There is no acute intracranial hemorrhage, mass effect, or edema. Gray-white differentiation is preserved. Patchy and confluent areas hypoattenuation in the supratentorial white but may reflect moderate chronic microvascular ischemic changes. There is no extra-axial fluid collection. Prominence of the ventricles and sulci reflects generalized parenchymal volume loss. Vascular: No hyperdense vessel. Skull: Calvarium is unremarkable. Sinuses/Orbits: No acute finding. Other: None. ASPECTS ([place] Stroke Program Early CT Score) - Ganglionic level infarction (caudate, lentiform nuclei, internal capsule, insula, M1-M3 cortex): 7 -  Supraganglionic infarction (M4-M6 cortex): 3 Total score (0-10 with 10 being normal): 10 CTA NECK Aortic arch: Great vessel origins are patent. Right carotid system: Patent. Mild calcified plaque at the ICA origin causing minimal stenosis. Left carotid system: Patent. Minimal calcified plaque at the proximal ICA without measurable stenosis. Vertebral arteries: Patent.  Right vertebral artery is dominant. Skeleton: Degenerative changes of the cervical spine primarily C6-C7. Other neck: Heterogeneous, multinodular thyroid. Further ultrasound follow-up is not recommended by current guidelines. Upper chest: Partially imaged small bilateral pleural effusions. Peribronchial and interstitial thickening likely reflecting interstitial edema. Review of the MIP images confirms the above findings CTA HEAD Anterior circulation: Intracranial internal carotid arteries are patent with mild calcified plaque. Left M1 MCA is patent. There is occlusion of a M2 MCA branch approximately 13 mm from the MCA trifurcation (see saved key image series 11, 27). There is partial reconstitution followed by occlusion and subsequent distal M3 reconstitution. Right middle and both anterior cerebral arteries are patent. Posterior circulation: Intracranial vertebral arteries are patent with minimal calcified plaque on the right. Basilar artery is patent. Posterior cerebral arteries are patent. There are bilateral posterior communicating arteries present. Venous sinuses: Patent as allowed by contrast bolus timing. Review of the MIP images confirms the above findings IMPRESSION: No acute intracranial hemorrhage. No acute infarction identified. ASPECT score is 10. 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 No hemodynamically significant stenosis the neck. Partially imaged mild interstitial pulmonary edema and small bilateral pleural effusions. Results were called by telephone at the time of interpretation on 05/03/2020 at 1:09  pm and again at 1:17 p.m. to provider [facility] , who verbally acknowledged these results. Electronically Signed: By: Macy Mis M.D. On: 05/03/2020 13:32     Assessment and Plan:   1. Atrial fibrillation with RVR: Cardiology team consulted for atrial fibrillation with rapid ventricular response with rates in the 190s. Patient is managed by Dr. Vale Haven at [facility] for persistent atrial fibrillation. Notably, patient has had multiple failed DC cardioversions in the past and has ultimately been managed on p.o. diltiazem 249 mg daily, and metoprolol 50 mg daily, with xarelto 20 mg daily for anticoagulation. Patient does have a history of multiple falls and were considering watchman procedure in the future for stroke risk reduction. Patient's previous echo showed an EF of 55 to 60% with moderate mitral and tricuspid regurgitation which is consistent with her TTE from this hospitalization. On evaluation, the patient was rate controlled with IV amiodarone and IV diltiazem with as needed metoprolol. Goal to switch patient to p.o. diltiazem today pending stroke swallow evaluation. Patient is currently on DVT prophylaxis with heparin but otherwise not on anticoagulation secondary to recent thrombolysis. - Can continue IV  amiodarone infusion for rate control, as patient will likely not convert to sinus rhythm as she has had a difficult time in the past maintaining sinus rhythm despite amiodarone and multiple DC cardioversions.   - Agree with transitioning to diltiazem 90 mg p.o. every 6 hours - Continue Toprol 5 mg IV every 3 hours as needed - She does have a history of falls, and would consider evaluation for [facility] in the future  2. Troponinemia:  Patient had a mild elevation in her troponin as follows 68>63>73. Recent echo shows EF of 55-60% without any wall motion abnormalities. EKG does not show any signs of ischemia.  Pateitn does admit to some chest pain, but difficult to assess her  symptoms due to aphasia. The troponinemia likely due to RVR rather than true ischemia  3, CVA: Presented with a left MCA stroke status post thrombectomy.  Stroke thought to be secondary to embolic event in the setting of atrial fibrillation despite Xarelto use.  Echo did not show any signs of thrombus.  Patient is now on aspirin and heparin for DVT prophylaxis.  Will defer treatment of stroke to neurology team.  4. Prediabetes: Patient has a hemoglobin A1c of 6.4.  Will defer treatment to primary team.  5.  Hyperlipidemia: Patient put on Zetia secondary to statin intolerance in the past.  For questions or updates, please contact [place] Please consult [email or web address] for contact info under Cardiology/STEMI.   Signed, Marianna Payment, MD  05/05/2020 1:08 PM   Patient seen and examined.  Agree with above documentation.  Sheila Ray is a 79 year old female with a history of chronic atrial fibrillation on Xarelto, hypertension, hyperlipidemia, CVA in 2014, T2DM, hypothyroidism who we are consulted by Dr. Erlinda Hong for evaluation of atrial fibrillation.  She has a history of chronic atrial fibrillation and has failed multiple cardioversions, even after amiodarone loading.  She presented on 11/3 with acute left MCA infarct status post thrombectomy.  Found to have heart rates up to 190s in AF.  She has been started on IV amiodarone and p.o. diltiazem.  High-sensitivity troponin 68 > 63 > 73.   On exam, patient is aphasic, irregular rhythm, tachycardic, 2/6 systolic murmur, lungs CTAB, no LE edema.  EKG today shows atrial fibrillation, rate 150, Q waves V1-4,III, aVF, low voltage.  Telemetry personally reviewed and shows AF up to 140s earlier today, currently improved to 100s.  Echocardiogram on 05/04/2020 shows LVEF 55 to 60%, moderate LVH, mild RV systolic dysfunction, moderate left atrial dilatation, severe right atrial dilatation, moderate MR, moderate to severe TR.  For her atrial fibrillation, continue  p.o. diltiazem for rate control, can convert to long-acting once closer to discharge.  Restart anticoagulation once okay from stroke perspective.  Okay to use IV amiodarone for rate control, unlikely to convert patient as she has chronic AF and has failed multiple cardioversions in the past, including while on amiodarone.  Would recommend discontinuing amiodarone as rates improve with diltiazem.  Donato Heinz, MD

## 2020-05-05 NOTE — Progress Notes (Signed)
NAME:  Sheila Ray, MRN:  [id number], DOB:  1940-12-12, LOS: 2 ADMISSION DATE:  05/03/2020, CONSULTATION DATE:  05/03/20 REFERRING MD:  [place]  CHIEF COMPLAINT:  A.fib RVR   Brief History   Sheila Ray is a 79 y.o. female who was admitted 11/3 with left M2/M3 occlusion s/p neuro IR revascularization.  Extubated post procedure then had A.fib with RVR (has underlying A.fib);  Past Medical History  has Vitamin D deficiency; Obesity (BMI 30-39.9); DM (diabetes mellitus) ([place]); Hypertension associated with diabetes ([place]); Hyperlipidemia associated with type 2 diabetes mellitus ([place]); Osteoporosis; Stroke [facility]); Hypothyroid; OA (osteoarthritis) of knee; Atrial fibrillation ([place]); Acute ischemic left MCA stroke ([place]); Middle cerebral artery embolism, left; Hypoxia; and Encephalopathy acute on their problem list.  [facility] Events   11/3 > admit.  Consults:  Neuro, PCCM.  Procedures:  ETT 11/3 > 11/3. L radial art line 11/3 >   Significant Diagnostic Tests:  CT / CTA head 11/3 > no hemorrhage or acute infarction.  Occlusion of left M2 MCA branch. CTP brain 11/3 > no core infarction.  69ml penumbra in posterior left MCA territory. CT head 11/3 >  MRI brain 11/4 >  Echo 11/4 >   Micro Data:  Flu 11/3 > neg. RSV 11/3 > neg. COVID 11/3 > neg.  Antimicrobials:  None.   Interim history/subjective:  Went into A. fib RVR last night with heart rate ranging in 150s, associated with shortness of breath.  Started on amiodarone infusion after bolus  Objective:  Blood pressure 128/89, pulse (!) 124, temperature 98 F (36.7 C), temperature source Oral, resp. rate (!) 27, SpO2 95 %.        Intake/Output Summary (Last 24 hours) at 05/05/2020 1324 Last data filed at 05/05/2020 1200 Gross per 24 hour  Intake 885.67 ml  Output 50 ml  Net 835.67 ml   There were no vitals filed for this visit.  Examination: General: Elderly Caucasian female, lying in the bed Neuro: Awake,  confused, unable to name few objects or repeat, intermittently following simple commands.  Left side is antigravity, right side is flaccid right arm more than leg  HEENT: [place]/AT. Sclerae anicteric.  EOMI. Cardiovascular: RRR, no M/R/G.  Lungs: Respirations even and unlabored on simple mask. CTA bilaterally, No W/R/R.  Abdomen: BS x 4, soft, NT/ND.  Musculoskeletal: No gross deformities, no edema.  Skin: Intact, warm, no rashes.  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 Continue statin Holding oral antihypertensive currently  Acute encephalopathy due to stroke Patient is confused, unable to name objects correctly Continue to monitor  Atrial fibrillation with RVR Patient is in A. fib with RVR, requiring amiodarone infusion Continue oral diltiazem Hold anticoagulation for now for secondary stroke prophylaxis  Demand cardiac ischemia Due to rapid ventricular response from A. fib Continue to monitor serum troponin Echocardiogram is pending  Hypothyroidism. TSH is low normal Send free T3 and T4  Hx DM.  SSI. Hold home metformin.   Best Practice:  Diet: Speech and swallow evaluation Pain/Anxiety/Delirium protocol (if indicated): N/A. VAP protocol (if indicated): N/A. DVT prophylaxis: SCD's / Heparin. GI prophylaxis: N/A. Glucose control: SSI. Mobility: Bedrest. Code Status: Full. Family Communication: Per primary team Disposition: ICU.  Labs   CBC: Recent Labs  Lab 05/03/20 1254 05/03/20 1254 05/03/20 1259 05/03/20 1320 05/03/20 1813 05/04/20 0317 05/05/20 0051  WBC 13.4*  --   --   --  15.6* 19.2* 16.2*  NEUTROABS 10.9*  --   --   --   --  17.5*  --   HGB 11.5*   < > 12.6 11.2* 10.6* 10.9*  10.2*  HCT 37.6   < > 37.0 33.0* 36.0 37.4 34.8*  MCV 77.8*  --   --   --  78.6* 82.4 78.6*  PLT 338  --   --   --  414* 407* 335   < > = values in this interval not displayed.   Basic Metabolic Panel: Recent Labs  Lab 05/03/20 1259 05/03/20 1317 05/03/20 1320 05/04/20 0317 05/04/20 0442 05/05/20 0051 05/05/20 0456  NA 134* 131* 136 136  --  135  --   K 4.1 3.7 3.8 4.2  --  3.8  --   CL 108 102 101 107  --  106  --   CO2  --  22  --  17*  --  19*  --   GLUCOSE 132* 132* 127* 194*  --  145*  --   BUN 10 11 9 12   --  15  --   CREATININE 0.50 0.64 0.60 0.82  --  0.76  --   CALCIUM  --  8.6*  --  8.8*  --  8.5*  --   MG  --   --   --   --  1.7  --  2.0   GFR: CrCl cannot be calculated (Unknown ideal weight.). Recent Labs  Lab 05/03/20 1254 05/03/20 1813 05/04/20 0317 05/05/20 0051  WBC 13.4* 15.6* 19.2* 16.2*   Liver Function Tests: Recent Labs  Lab 05/03/20 1317  AST 15  ALT 12  ALKPHOS 52  BILITOT 0.5  PROT 5.7*  ALBUMIN 3.0*   No results for input(s): LIPASE, AMYLASE in the last 168 hours. No results for input(s): AMMONIA in the last 168 hours. ABG    Component Value Date/Time   TCO2 22 05/03/2020 1320    Coagulation Profile: Recent Labs  Lab 05/03/20 1317  INR 1.2   Cardiac Enzymes: No results for input(s): CKTOTAL, CKMB, CKMBINDEX, TROPONINI in the last 168 hours. HbA1C: HB A1C (BAYER DCA - WAIVED)  Date/Time Value Ref Range Status  03/12/2019 09:29 AM 6.4 <7.0 % Final    Comment:                                          Diabetic Adult            <7.0                                       Healthy Adult        4.3 - 5.7                                                           (DCCT/NGSP) American Diabetes Association's Summary of Glycemic Recommendations for Adults with Diabetes: Hemoglobin A1c <7.0%. More stringent glycemic goals (  A1c <6.0%) may further reduce complications at the cost of increased risk of hypoglycemia.   10/29/2018 08:42  AM 6.6 <7.0 % Final    Comment:                                          Diabetic Adult            <7.0                                       Healthy Adult        4.3 - 5.7                                                           (DCCT/NGSP) American Diabetes Association's Summary of Glycemic Recommendations for Adults with Diabetes: Hemoglobin A1c <7.0%. More stringent glycemic goals (A1c <6.0%) may further reduce complications at the cost of increased risk of hypoglycemia.    Hgb A1c MFr Bld  Date/Time Value Ref Range Status  05/04/2020 03:17 AM 6.4 (H) 4.8 - 5.6 % Final    Comment:    (NOTE) Pre diabetes:          5.7%-6.4%  Diabetes:              >6.4%  Glycemic control for   <7.0% adults with diabetes    CBG: Recent Labs  Lab 05/04/20 1547 05/04/20 1924 05/04/20 2311 05/05/20 0327 05/05/20 1241  GLUCAP 110* 121* 145* 163* 245*   Total critical care time: 41 minutes  Performed by: [place] care time was exclusive of separately billable procedures and treating other patients.   Critical care was necessary to treat or prevent imminent or life-threatening deterioration.   Critical care was time spent personally by me on the following activities: development of treatment plan with patient and/or surrogate as well as nursing, discussions with consultants, evaluation of patient's response to treatment, examination of patient, obtaining history from patient or surrogate, ordering and performing treatments and interventions, ordering and review of laboratory studies, ordering and review of radiographic studies, pulse oximetry and re-evaluation of patient's condition.   Jacky Kindle MD Critical care physician [facility]  Pager: [phone] Mobile: [phone]

## 2020-05-05 NOTE — Progress Notes (Signed)
Speech Language Pathology Treatment: Dysphagia;Cognitive-Linquistic (Aphasia)  Patient Details Name: Sheila Ray MRN: [id number] DOB: Jul 07, 1940 Today's Date: 05/05/2020 Time: 1191-4782 SLP Time Calculation (min) (ACUTE ONLY): 25 min  Assessment / Plan / Recommendation Clinical Impression  Pt was seen for treatment. Nursing reported that the pt's p.o. intake has been poor today. She was alert throughout the session and verbal output was improved compared to that which was noted during the evaluation on 05/05/20. Pt refused all solids despite encouragement. She exhibited throat clearing with 2/10 boluses of thin liquids via straw but all other boluses were tolerated well. Pt was able to produce spontaneous phrases during today's session and reliably respond to simple yes/no questions. Perseveration was still observed during the session but she provided both "yes" and "no" responses to simple questions with 100% accuracy. She responded appropriately to some open-ended questions, but needed additional processing time for word retrieval and sentence formulation. Spontaneously produced utterances included, "I don't so", "I want", "No, but some water", "I need another water", "No, a cup", "Fantastic", "I don't want nothing to eat.", and "Just don't want anything." Aggramatism was noted but pt was inconsistently able to self-correct. Overall, her overall language skills have improved compared to 05/04/20. SLP will continue to follow pt.    HPI HPI: Pt is a 79 y.o. female who has a PMH including but not limited to A.fib on xarelto, HTN, HLD, DM, hypothyroidism, prior left thalamic stroke with residual deficits.  She presented to the ED 11/3 with confusion, aphasia, right facial droop, right leg weakness.  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  She was taken to IR and had right common carotid arteriogram followed by revascularization of occluded superior division M2 branch and partial  revascularization of the distal M3 branch with mechanical thrombolysis.  Post CT brain showed no ICH or mass effect. She was intubated 11/3 for procedure. EEg: cortical dysfunction in left hemisphere, maximal left temporal region consistent with underlying stroke. No seizures.       SLP Plan  Continue with current plan of care       Recommendations  Diet recommendations: Regular;Thin liquid Liquids provided via: Cup;Straw Medication Administration: Whole meds with puree Supervision: Staff to assist with self feeding Compensations: Minimize environmental distractions                Oral Care Recommendations: Oral care BID Follow up Recommendations:  (Continued SLP services at level of care recommended by PT/OT) SLP Visit Diagnosis: Dysphagia, unspecified (R13.10) Plan: Continue with current plan of care       Sheila Ray I. Vear Clock, MS, CCC-SLP Acute Rehabilitation Services Office number [phone] Pager [phone]                Sheila Ray 05/05/2020, 2:02 PM

## 2020-05-05 NOTE — Progress Notes (Signed)
Referring Physician(s): Code stroke- Bhagat, Srishti L (neurology)  Supervising Physician: Luanne Bras  Patient Status:  [facility] - In-pt  Chief Complaint: "Tired" and speech  Subjective:  History of acute CVA s/p cerebral arteriogram with emergent mechanical thrombectomy of left MCA superior division M2 occlusion achieving a TICI 2c revascularization via right femoral approach 05/03/2020 by Dr. Estanislado Pandy (also found to have recanalizing right ACA A2/A3 junction filling note on this exam). Patient awake and alert laying in bed. Appears uncomfortable states that she is tired. Demonstrates expressive aphasia but able to answer yes/no questions appropriately. Can spontaneously move left side, can wiggle right toes, no spontaneous movements of RUE. Right femoral puncture site c/d/i.   Allergies: Crestor [rosuvastatin], Lipitor [atorvastatin], Pravastatin, and Keflex [cephalexin]  Medications: Prior to Admission medications   Medication Sig Start Date End Date Taking? Authorizing Provider  alendronate (FOSAMAX) 70 MG tablet TAKE 1 TABLET BY MOUTH  WEEKLY AS DIRECTED Patient taking differently: Take 70 mg by mouth once a week.  03/15/19  Yes Hawks, Christy A, FNP  Cyanocobalamin (VITAMIN B12) 1000 MCG TBCR Take 1,000 mcg by mouth daily.    Yes [provider]  lisinopril (ZESTRIL) 40 MG tablet TAKE 1 TABLET BY MOUTH  DAILY Patient taking differently: Take 40 mg by mouth daily.  03/18/19  Yes Hawks, Christy A, FNP  Magnesium Oxide (MAG-OX 400 PO) Take 400 mg by mouth daily.   Yes [provider]  metFORMIN (GLUCOPHAGE) 1000 MG tablet Take 0.5 tablets (500 mg total) by mouth daily with breakfast. 12/16/19  Yes Hawks, Christy A, FNP  metoprolol succinate (TOPROL-XL) 50 MG 24 hr tablet TAKE 1 TABLET BY MOUTH  DAILY WITH OR IMMEDIATELY  FOLLOWING A MEAL Patient taking differently: Take 37.5 mg by mouth in the morning and at bedtime.  02/16/20  Yes Hawks, Christy A, FNP    XARELTO 20 MG TABS tablet TAKE 1 TABLET BY MOUTH  DAILY Patient taking differently: Take 20 mg by mouth daily with supper.  12/03/18  Yes Hawks, Christy A, FNP  diltiazem (CARDIZEM CD) 240 MG 24 hr capsule TAKE 1 CAPSULE BY MOUTH  DAILY Patient taking differently: Take 240 mg by mouth daily.  03/18/19   [name] A, FNP  glucose blood (ONE TOUCH ULTRA TEST) test strip USE TO CHECK BLOOD GLUCOSE  ONCE A DAY AS INSTRUCTED 12/16/19   Sharion Balloon, FNP  [facility] DELICA LANCETS 65K MISC 1 each by Does not apply route daily. Use to check BG daily 09/12/17   [name] A, FNP     Vital Signs: BP (!) 139/98   Pulse (!) 106   Temp 98 F (36.7 C) (Oral)   Resp (!) 27   SpO2 94%   Physical Exam Vitals and nursing note reviewed.  Constitutional:      General: She is not in acute distress. Pulmonary:     Effort: Pulmonary effort is normal. No respiratory distress.  Skin:    General: Skin is warm and dry.     Comments: Right femoral puncture site soft without active bleeding or hematoma.  Neurological:     Mental Status: She is alert.     Comments: Alert, awake, and oriented x3. Follows simple commands. Demonstrates expressive aphasia but able to answer yes/no questions appropriately. PERRL bilaterally. EOMs intact bilaterally without nystagmus or subjective diplopia. No facial asymmetry. Tongue midline. Demonstrates expressive aphasia but able to answer yes/no questions appropriately. Distal pulses (DPs) 1+ bilaterally.     Imaging:  EEG  Result Date: 05/04/2020 Lora Havens, MD     05/04/2020  1:46 PM Patient Name: Sheila Ray MRN: [id number] Epilepsy Attending: Lora Havens Referring Physician/Provider: Dr Kerney Elbe Date: 05/04/2020 Duration: 23.54 mins Patient history: 79 year old female with left MCA stroke s/p revascularization, with AMS. EEG to evaluate for seizure. Level of alertness: Awake AEDs during EEG study: None Technical aspects: This EEG study was  done with scalp electrodes positioned according to the 10-20 International system of electrode placement. Electrical activity was acquired at a sampling rate of 500Hz  and reviewed with a high frequency filter of 70Hz  and a low frequency filter of 1Hz . EEG data were recorded continuously and digitally stored. Description: The posterior dominant rhythm consists of 9 Hz activity of moderate voltage (25-35 uV) seen predominantly in posterior head regions, asymmetric ( L<R) and reactive to eye opening and eye closing.  EEG showed continuous 3 to 5 Hz theta-delta slowing.  Left hemisphere, maximal left temporal region which at times appeared sharply contoured.  Hyperventilation and photic stimulation were not performed.   ABNORMALITY -Continuous slow, lateralized left hemisphere, maximal left temporal region -Background asymmetry, left<right IMPRESSION: This study is suggestive of cortical dysfunction in left hemisphere, maximal left temporal region consistent with underlying stroke.  No seizures or definite epileptiform discharges were seen throughout the recording. If suspicion for interictal activity remains a concern, a prolonged study can be considered. Lora Havens   CT Angio Head W or Wo Contrast  Addendum Date: 05/03/2020   ADDENDUM REPORT: 05/03/2020 14:38 ADDENDUM: Exam and technique should not include CT perfusion, which was performed subsequently and dictated separately. Only CT head without contrast and CT angiography of the head and neck with contrast performed. Electronically Signed   By: Macy Mis M.D.   On: 05/03/2020 14:38   Result Date: 05/03/2020 CLINICAL DATA:  Code stroke.  Right-sided weakness EXAM: CT HEAD WITHOUT CONTRAST CT ANGIOGRAPHY HEAD AND NECK CT PERFUSION BRAIN TECHNIQUE: Contiguous axial images were obtained the skull base to the vertex without contrast. Multidetector CT imaging of the head and neck was performed using the standard protocol during bolus administration of  intravenous contrast. Multiplanar CT image reconstructions and MIPs were obtained to evaluate the vascular anatomy. Carotid stenosis measurements (when applicable) are obtained utilizing NASCET criteria, using the distal internal carotid diameter as the denominator. Multiphase CT imaging of the brain was performed following IV bolus contrast injection. Subsequent parametric perfusion maps were calculated using RAPID software. CONTRAST:  30mL OMNIPAQUE IOHEXOL 350 MG/ML SOLN COMPARISON:  None. FINDINGS: CT HEAD Brain: There is no acute intracranial hemorrhage, mass effect, or edema. Gray-white differentiation is preserved. Patchy and confluent areas hypoattenuation in the supratentorial white but may reflect moderate chronic microvascular ischemic changes. There is no extra-axial fluid collection. Prominence of the ventricles and sulci reflects generalized parenchymal volume loss. Vascular: No hyperdense vessel. Skull: Calvarium is unremarkable. Sinuses/Orbits: No acute finding. Other: None. ASPECTS ([place] Stroke Program Early CT Score) - Ganglionic level infarction (caudate, lentiform nuclei, internal capsule, insula, M1-M3 cortex): 7 - Supraganglionic infarction (M4-M6 cortex): 3 Total score (0-10 with 10 being normal): 10 CTA NECK Aortic arch: Great vessel origins are patent. Right carotid system: Patent. Mild calcified plaque at the ICA origin causing minimal stenosis. Left carotid system: Patent. Minimal calcified plaque at the proximal ICA without measurable stenosis. Vertebral arteries: Patent.  Right vertebral artery is dominant. Skeleton: Degenerative changes of the cervical spine primarily C6-C7. Other neck: Heterogeneous, multinodular thyroid. Further  ultrasound follow-up is not recommended by current guidelines. Upper chest: Partially imaged small bilateral pleural effusions. Peribronchial and interstitial thickening likely reflecting interstitial edema. Review of the MIP images confirms the above  findings CTA HEAD Anterior circulation: Intracranial internal carotid arteries are patent with mild calcified plaque. Left M1 MCA is patent. There is occlusion of a M2 MCA branch approximately 13 mm from the MCA trifurcation (see saved key image series 11, 27). There is partial reconstitution followed by occlusion and subsequent distal M3 reconstitution. Right middle and both anterior cerebral arteries are patent. Posterior circulation: Intracranial vertebral arteries are patent with minimal calcified plaque on the right. Basilar artery is patent. Posterior cerebral arteries are patent. There are bilateral posterior communicating arteries present. Venous sinuses: Patent as allowed by contrast bolus timing. Review of the MIP images confirms the above findings IMPRESSION: No acute intracranial hemorrhage. No acute infarction identified. ASPECT score is 10. 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 No hemodynamically significant stenosis the neck. Partially imaged mild interstitial pulmonary edema and small bilateral pleural effusions. Results were called by telephone at the time of interpretation on 05/03/2020 at 1:09 pm and again at 1:17 p.m. to provider [facility] , who verbally acknowledged these results. Electronically Signed: By: Macy Mis M.D. On: 05/03/2020 13:32   CT Angio Neck W and/or Wo Contrast  Addendum Date: 05/03/2020   ADDENDUM REPORT: 05/03/2020 14:38 ADDENDUM: Exam and technique should not include CT perfusion, which was performed subsequently and dictated separately. Only CT head without contrast and CT angiography of the head and neck with contrast performed. Electronically Signed   By: Macy Mis M.D.   On: 05/03/2020 14:38   Result Date: 05/03/2020 CLINICAL DATA:  Code stroke.  Right-sided weakness EXAM: CT HEAD WITHOUT CONTRAST CT ANGIOGRAPHY HEAD AND NECK CT PERFUSION BRAIN TECHNIQUE: Contiguous axial images were obtained the skull base to the vertex  without contrast. Multidetector CT imaging of the head and neck was performed using the standard protocol during bolus administration of intravenous contrast. Multiplanar CT image reconstructions and MIPs were obtained to evaluate the vascular anatomy. Carotid stenosis measurements (when applicable) are obtained utilizing NASCET criteria, using the distal internal carotid diameter as the denominator. Multiphase CT imaging of the brain was performed following IV bolus contrast injection. Subsequent parametric perfusion maps were calculated using RAPID software. CONTRAST:  9mL OMNIPAQUE IOHEXOL 350 MG/ML SOLN COMPARISON:  None. FINDINGS: CT HEAD Brain: There is no acute intracranial hemorrhage, mass effect, or edema. Gray-white differentiation is preserved. Patchy and confluent areas hypoattenuation in the supratentorial white but may reflect moderate chronic microvascular ischemic changes. There is no extra-axial fluid collection. Prominence of the ventricles and sulci reflects generalized parenchymal volume loss. Vascular: No hyperdense vessel. Skull: Calvarium is unremarkable. Sinuses/Orbits: No acute finding. Other: None. ASPECTS ([place] Stroke Program Early CT Score) - Ganglionic level infarction (caudate, lentiform nuclei, internal capsule, insula, M1-M3 cortex): 7 - Supraganglionic infarction (M4-M6 cortex): 3 Total score (0-10 with 10 being normal): 10 CTA NECK Aortic arch: Great vessel origins are patent. Right carotid system: Patent. Mild calcified plaque at the ICA origin causing minimal stenosis. Left carotid system: Patent. Minimal calcified plaque at the proximal ICA without measurable stenosis. Vertebral arteries: Patent.  Right vertebral artery is dominant. Skeleton: Degenerative changes of the cervical spine primarily C6-C7. Other neck: Heterogeneous, multinodular thyroid. Further ultrasound follow-up is not recommended by current guidelines. Upper chest: Partially imaged small bilateral pleural  effusions. Peribronchial and interstitial thickening likely reflecting  interstitial edema. Review of the MIP images confirms the above findings CTA HEAD Anterior circulation: Intracranial internal carotid arteries are patent with mild calcified plaque. Left M1 MCA is patent. There is occlusion of a M2 MCA branch approximately 13 mm from the MCA trifurcation (see saved key image series 11, 27). There is partial reconstitution followed by occlusion and subsequent distal M3 reconstitution. Right middle and both anterior cerebral arteries are patent. Posterior circulation: Intracranial vertebral arteries are patent with minimal calcified plaque on the right. Basilar artery is patent. Posterior cerebral arteries are patent. There are bilateral posterior communicating arteries present. Venous sinuses: Patent as allowed by contrast bolus timing. Review of the MIP images confirms the above findings IMPRESSION: No acute intracranial hemorrhage. No acute infarction identified. ASPECT score is 10. 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 No hemodynamically significant stenosis the neck. Partially imaged mild interstitial pulmonary edema and small bilateral pleural effusions. Results were called by telephone at the time of interpretation on 05/03/2020 at 1:09 pm and again at 1:17 p.m. to provider [facility] , who verbally acknowledged these results. Electronically Signed: By: Macy Mis M.D. On: 05/03/2020 13:32   MR ANGIO HEAD WO CONTRAST  Result Date: 05/04/2020 CLINICAL DATA:  Stroke, follow-up. EXAM: MRI HEAD WITHOUT CONTRAST MRA HEAD WITHOUT CONTRAST TECHNIQUE: Multiplanar, multiecho pulse sequences of the brain and surrounding structures were obtained without intravenous contrast. Angiographic images of the head were obtained using MRA technique without contrast. COMPARISON:  CT/CT angiogram of the head and neck May 03, 2020. FINDINGS: MRI HEAD FINDINGS Brain: Cortical restricted  diffusion is seen within the left temporal occipital region. Scattered foci of restricted diffusion are also seen in the left frontoparietal and insular regions and left amygdala. Focus of susceptibility artifact in the left temporal region may represent residual clot in a distal branch versus small focus of hemorrhage. No hemorrhagic transformation, significant mass effect or midline shift. No hydrocephalus or mass lesion. Scattered and confluent foci of T2 hyperintensity are seen within the white matter of the cerebral hemispheres, nonspecific, most likely related to chronic small vessel ischemia. 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 Moderate parenchymal volume loss. Vascular: Normal flow voids. Skull and upper cervical spine: Normal marrow signal. Sinuses/Orbits: Mild mucosal thickening of the ethmoid cells. The orbits are maintained. MRA HEAD FINDINGS The study is partially degraded by motion. Normal flow related enhancement is seen within the upper cervical and intracranial segment of the bilateral ICAs. Moderate stenosis of the right carotid terminus is likely artifactual given no stenosis on recent CTA. The bilateral M1-M2/M segments and bilateral A1-A2/ACA segments have normal flow related enhancement. Hypoplastic left vertebral artery with a dominant right vertebral artery with normal flow related enhancement. The basilar artery and bilateral posterior cerebral arteries are also patent. Luminal irregularity along the bilateral PCAs is suggestive of intracranial atherosclerotic disease. No proximal occlusion or stenosis, aneurysm or vascular malformation. IMPRESSION: 1. Cortical restricted diffusion within the left temporal occipital region. Scattered foci of restricted diffusion are also seen in the left frontoparietal and insular regions and left amygdala. Findings are consistent with acute infarcts. No hemorrhagic transformation, significant mass effect or midline shift. 2. No proximal  occlusion or stenosis in the anterior or posterior circulation. Results communicated to Dr. Lottie Rater via [facility] pager system. Electronically Signed   By: Pedro Earls M.D.   On: 05/04/2020 14:19   MR ANGIO HEAD WO CONTRAST  Result Date: 05/03/2020 CLINICAL DATA:  Follow-up examination for acute stroke. Patient with previously identified left M2 branch occlusion, superior division. Status post catheter directed intervention achieving TICI2C revascularization. Recanalized A2/A3 filling defect also noted on arteriogram. EXAM: MRI HEAD WITHOUT CONTRAST MRA HEAD WITHOUT CONTRAST TECHNIQUE: Multiplanar, multiecho pulse sequences of the brain and surrounding structures were obtained without intravenous contrast. Angiographic images of the head were obtained using MRA technique without contrast. COMPARISON:  Prior CTs from earlier the same day. FINDINGS: MRI HEAD FINDINGS Brain: Examination mildly degraded by motion artifact. Generalized age-related cerebral atrophy. Patchy and confluent T2/FLAIR hyperintensity within the periventricular, deep, and subcortical white matter both cerebral hemispheres, nonspecific, but most like related chronic microvascular ischemic disease, moderate in nature. Superimposed remote lacunar infarct at the left corona radiata/basal ganglia. Patchy areas of restricted diffusion seen involving the cortical gray matter of the left insula, left temporal occipital region, and mesial left temporal lobe are seen, consistent with small volume acute ischemic infarcts (series 5, images 70, 69, 67). These are predominantly left MCA distribution. Additional patchy small volume cortical infarcts noted at the high left frontoparietal region (series 5, images 91, 85), left ACA and MCA distribution. No associated mass effect. Single small focus of associated petechial hemorrhage present at the left temporal region (series 18, image 27). No other evidence for acute or subacute ischemia.  Gray-white matter differentiation otherwise maintained. No mass lesion, mass effect, or midline shift. No hydrocephalus or extra-axial fluid collection. Pituitary gland and suprasellar region within normal limits. Vascular: Major intracranial vascular flow voids are maintained. Skull and upper cervical spine: Craniocervical junction within normal limits. Bone marrow signal intensity normal. No scalp soft tissue abnormality. Sinuses/Orbits: Globes and orbital soft tissues within normal limits. Paranasal sinuses are clear. No mastoid effusion. Other: None. MRA HEAD FINDINGS ANTERIOR CIRCULATION: Examination degraded by motion artifact. Visualized distal cervical segments of the internal carotid arteries are grossly patent with antegrade flow. Apparent short-segment defect at the distal cervical left ICA felt to be due to motion artifact, as no stenosis or other defect seen within this region on corresponding arteriogram. Petrous, cavernous, and supraclinoid segments of both internal carotid arteries patent without appreciable stenosis or other abnormality. A1 segments patent bilaterally. Grossly normal anterior communicating artery complex. Partially visualized ACAs perfused to their distal aspects without appreciable stenosis. M1 segments patent bilaterally. Left MCA trifurcations. Previously identified occluded left M2 branch appears grossly patent status post revascularization. MCA branches otherwise well perfused bilaterally. POSTERIOR CIRCULATION: Right vertebral artery strongly dominant and patent to the vertebrobasilar junction. Right PICA not definitely seen. Left vertebral artery hypoplastic and largely terminates in PICA, although a small branch seen ascending towards the vertebrobasilar junction. Left PICA patent proximally. Basilar diffusely irregular but is patent to its distal aspect without high-grade stenosis. Superior cerebral arteries grossly patent proximally. Both PCAs are patent proximally, not  well assessed distally due to motion. No intracranial aneurysm. IMPRESSION: MRI HEAD IMPRESSION: 1. 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 Associated minimal petechial hemorrhage at the left temporal region without significant mass effect. 2. Underlying age-related cerebral atrophy with moderate chronic microvascular ischemic disease. MRA HEAD IMPRESSION: 1. Technically limited exam due to motion artifact. 2. Previously identified occluded left M2 branch appears grossly patent status post revascularization. 3. Otherwise grossly stable and negative intracranial MRA. No other large vessel occlusion. No other hemodynamically significant or correctable stenosis. Electronically Signed   By: Jeannine Boga M.D.   On: 05/03/2020 22:06   MR  BRAIN WO CONTRAST  Result Date: 05/04/2020 CLINICAL DATA:  Stroke, follow-up. EXAM: MRI HEAD WITHOUT CONTRAST MRA HEAD WITHOUT CONTRAST TECHNIQUE: Multiplanar, multiecho pulse sequences of the brain and surrounding structures were obtained without intravenous contrast. Angiographic images of the head were obtained using MRA technique without contrast. COMPARISON:  CT/CT angiogram of the head and neck May 03, 2020. FINDINGS: MRI HEAD FINDINGS Brain: Cortical restricted diffusion is seen within the left temporal occipital region. Scattered foci of restricted diffusion are also seen in the left frontoparietal and insular regions and left amygdala. Focus of susceptibility artifact in the left temporal region may represent residual clot in a distal branch versus small focus of hemorrhage. No hemorrhagic transformation, significant mass effect or midline shift. No hydrocephalus or mass lesion. Scattered and confluent foci of T2 hyperintensity are seen within the white matter of the cerebral hemispheres, nonspecific, most likely related to chronic small vessel ischemia. Remote lacunar infarct in the left  corona radiata. Moderate parenchymal volume loss. Vascular: Normal flow voids. Skull and upper cervical spine: Normal marrow signal. Sinuses/Orbits: Mild mucosal thickening of the ethmoid cells. The orbits are maintained. MRA HEAD FINDINGS The study is partially degraded by motion. Normal flow related enhancement is seen within the upper cervical and intracranial segment of the bilateral ICAs. Moderate stenosis of the right carotid terminus is likely artifactual given no stenosis on recent CTA. The bilateral M1-M2/M segments and bilateral A1-A2/ACA segments have normal flow related enhancement. Hypoplastic left vertebral artery with a dominant right vertebral artery with normal flow related enhancement. The basilar artery and bilateral posterior cerebral arteries are also patent. Luminal irregularity along the bilateral PCAs is suggestive of intracranial atherosclerotic disease. No proximal occlusion or stenosis, aneurysm or vascular malformation. IMPRESSION: 1. Cortical restricted diffusion within the left temporal occipital region. Scattered foci of restricted diffusion are also seen in the left frontoparietal and insular regions and left amygdala. Findings are consistent with acute infarcts. No hemorrhagic transformation, significant mass effect or midline shift. 2. No proximal occlusion or stenosis in the anterior or posterior circulation. Results communicated to Dr. Lottie Rater via [facility] pager system. Electronically Signed   By: Pedro Earls M.D.   On: 05/04/2020 14:19   MR BRAIN WO CONTRAST  Result Date: 05/03/2020 CLINICAL DATA:  Follow-up examination for acute stroke. Patient with previously identified left M2 branch occlusion, superior division. Status post catheter directed intervention achieving TICI2C revascularization. Recanalized A2/A3 filling defect also noted on arteriogram. EXAM: MRI HEAD WITHOUT CONTRAST MRA HEAD WITHOUT CONTRAST TECHNIQUE: Multiplanar, multiecho pulse  sequences of the brain and surrounding structures were obtained without intravenous contrast. Angiographic images of the head were obtained using MRA technique without contrast. COMPARISON:  Prior CTs from earlier the same day. FINDINGS: MRI HEAD FINDINGS Brain: Examination mildly degraded by motion artifact. Generalized age-related cerebral atrophy. Patchy and confluent T2/FLAIR hyperintensity within the periventricular, deep, and subcortical white matter both cerebral hemispheres, nonspecific, but most like related chronic microvascular ischemic disease, moderate in nature. Superimposed remote lacunar infarct at the left corona radiata/basal ganglia. Patchy areas of restricted diffusion seen involving the cortical gray matter of the left insula, left temporal occipital region, and mesial left temporal lobe are seen, consistent with small volume acute ischemic infarcts (series 5, images 70, 69, 67). These are predominantly left MCA distribution. Additional patchy small volume cortical infarcts noted at the high left frontoparietal region (series 5, images 91, 85), left ACA and MCA distribution. No associated mass effect. Single small focus of associated  petechial hemorrhage present at the left temporal region (series 18, image 27). No other evidence for acute or subacute ischemia. Gray-white matter differentiation otherwise maintained. No mass lesion, mass effect, or midline shift. No hydrocephalus or extra-axial fluid collection. Pituitary gland and suprasellar region within normal limits. Vascular: Major intracranial vascular flow voids are maintained. Skull and upper cervical spine: Craniocervical junction within normal limits. Bone marrow signal intensity normal. No scalp soft tissue abnormality. Sinuses/Orbits: Globes and orbital soft tissues within normal limits. Paranasal sinuses are clear. No mastoid effusion. Other: None. MRA HEAD FINDINGS ANTERIOR CIRCULATION: Examination degraded by motion artifact.  Visualized distal cervical segments of the internal carotid arteries are grossly patent with antegrade flow. Apparent short-segment defect at the distal cervical left ICA felt to be due to motion artifact, as no stenosis or other defect seen within this region on corresponding arteriogram. Petrous, cavernous, and supraclinoid segments of both internal carotid arteries patent without appreciable stenosis or other abnormality. A1 segments patent bilaterally. Grossly normal anterior communicating artery complex. Partially visualized ACAs perfused to their distal aspects without appreciable stenosis. M1 segments patent bilaterally. Left MCA trifurcations. Previously identified occluded left M2 branch appears grossly patent status post revascularization. MCA branches otherwise well perfused bilaterally. POSTERIOR CIRCULATION: Right vertebral artery strongly dominant and patent to the vertebrobasilar junction. Right PICA not definitely seen. Left vertebral artery hypoplastic and largely terminates in PICA, although a small branch seen ascending towards the vertebrobasilar junction. Left PICA patent proximally. Basilar diffusely irregular but is patent to its distal aspect without high-grade stenosis. Superior cerebral arteries grossly patent proximally. Both PCAs are patent proximally, not well assessed distally due to motion. No intracranial aneurysm. IMPRESSION: MRI HEAD IMPRESSION: 1. 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 Associated minimal petechial hemorrhage at the left temporal region without significant mass effect. 2. Underlying age-related cerebral atrophy with moderate chronic microvascular ischemic disease. MRA HEAD IMPRESSION: 1. Technically limited exam due to motion artifact. 2. Previously identified occluded left M2 branch appears grossly patent status post revascularization. 3. Otherwise grossly stable and negative intracranial  MRA. No other large vessel occlusion. No other hemodynamically significant or correctable stenosis. Electronically Signed   By: Jeannine Boga M.D.   On: 05/03/2020 22:06   CT CEREBRAL PERFUSION W CONTRAST  Result Date: 05/03/2020 CLINICAL DATA:  Right-sided weakness, M2 MCA occlusion EXAM: CT PERFUSION BRAIN TECHNIQUE: Multiphase CT imaging of the brain was performed following IV bolus contrast injection. Subsequent parametric perfusion maps were calculated using RAPID software. CONTRAST:  80mL OMNIPAQUE IOHEXOL 350 MG/ML SOLN COMPARISON:  None. FINDINGS: CT Brain Perfusion Findings: CBF (<30%) Volume: 11mL Perfusion (Tmax>6.0s) volume: 57mL Mismatch Volume: 59mL ASPECTS on noncontrast CT Head: 10 at 1:02 p.m. today. Infarct Core: 0 mL Infarction Location:Territory at risk in the posterior left MCA territory IMPRESSION: No core infarction identified. 67 mL of penumbra in the posterior left MCA territory. Electronically Signed   By: Macy Mis M.D.   On: 05/03/2020 14:36   DG CHEST PORT 1 VIEW  Result Date: 05/05/2020 CLINICAL DATA:  Hypoxia EXAM: PORTABLE CHEST 1 VIEW COMPARISON:  05/03/2020 FINDINGS: Cardiomegaly and vascular pedicle widening. Diffuse hazy and interstitial opacity with small pleural effusions. Negative for pneumothorax. IMPRESSION: CHF pattern with mild improvement 2 days ago. Electronically Signed   By: Monte Fantasia M.D.   On: 05/05/2020 04:02   DG CHEST PORT 1 VIEW  Result Date: 05/03/2020 CLINICAL DATA:  Hypoxia, recent cerebral revascularization. EXAM: PORTABLE  CHEST 1 VIEW COMPARISON:  02/07/2020 FINDINGS: Cardiac shadow is enlarged but stable. Diffuse vascular congestion is noted with some patchy edema particularly in the right lung. Small effusions are noted bilaterally. No bony abnormality is seen. IMPRESSION: Vascular congestion with mild parenchymal edema on the right consistent with congestive failure. Electronically Signed   By: Inez Catalina M.D.   On:  05/03/2020 19:25   ECHOCARDIOGRAM COMPLETE  Result Date: 05/04/2020    ECHOCARDIOGRAM REPORT   Patient Name:   Sheila Ray Date of Exam: 05/04/2020 Medical Rec #:  [id number]       Height:       64.5 in Accession #:    [id number]      Weight:       175.0 lb Date of Birth:  14-Dec-1940        BSA:          1.859 m Patient Age:    29 years        BP:           121/79 mmHg Patient Gender: F               HR:           96 bpm. Exam Location:  Inpatient Procedure: 2D Echo, Cardiac Doppler, Color Doppler and Intracardiac            Opacification Agent Indications:    CVA  History:        Patient has no prior history of Echocardiogram examinations.                 Stroke, Arrythmias:Atrial Fibrillation; Risk                 Factors:Hypertension, Diabetes, Dyslipidemia and Obesity.  Sonographer:    Dustin Flock Referring Phys: [id number] [place]  1. Left ventricular ejection fraction, by estimation, is 55 to 60%. The left ventricle has normal function. The left ventricle has no regional wall motion abnormalities. There is moderate concentric left ventricular hypertrophy. Left ventricular diastolic function could not be evaluated.  2. Right ventricular systolic function is mildly reduced. The right ventricular size is moderately enlarged. There is moderately elevated pulmonary artery systolic pressure. The estimated right ventricular systolic pressure is 76.7 mmHg.  3. Left atrial size was moderately dilated.  4. Right atrial size was severely dilated.  5. A small pericardial effusion is present. The pericardial effusion is posterior to the left ventricle. Large pleural effusion in the left lateral region.  6. The mitral valve is normal in structure. Moderate mitral valve regurgitation. No evidence of mitral stenosis.  7. Tricuspid valve regurgitation is moderate to severe.  8. The aortic valve is normal in structure. Aortic valve regurgitation is trivial. No aortic stenosis is present.  9. The  inferior vena cava is normal in size with greater than 50% respiratory variability, suggesting right atrial pressure of 3 mmHg. Conclusion(s)/Recommendation(s): No intracardiac source of embolism detected on this transthoracic study. A transesophageal echocardiogram is recommended to exclude cardiac source of embolism if clinically indicated. FINDINGS  Left Ventricle: Left ventricular ejection fraction, by estimation, is 55 to 60%. The left ventricle has normal function. The left ventricle has no regional wall motion abnormalities. Definity contrast agent was given IV to delineate the left ventricular  endocardial borders. The left ventricular internal cavity size was normal in size. There is moderate concentric left ventricular hypertrophy. Left ventricular diastolic function could not be evaluated due to atrial fibrillation. Left  ventricular diastolic function could not be evaluated. Normal left ventricular filling pressure. Right Ventricle: The right ventricular size is moderately enlarged. No increase in right ventricular wall thickness. Right ventricular systolic function is mildly reduced. There is moderately elevated pulmonary artery systolic pressure. The tricuspid regurgitant velocity is 2.86 m/s, and with an assumed right atrial pressure of 15 mmHg, the estimated right ventricular systolic pressure is 09.9 mmHg. Left Atrium: Left atrial size was moderately dilated. Right Atrium: Right atrial size was severely dilated. Pericardium: A small pericardial effusion is present. The pericardial effusion is posterior to the left ventricle. Mitral Valve: The mitral valve is normal in structure. Moderate mitral valve regurgitation. No evidence of mitral valve stenosis. Tricuspid Valve: The tricuspid valve is normal in structure. Tricuspid valve regurgitation is moderate to severe. No evidence of tricuspid stenosis. Aortic Valve: The aortic valve is normal in structure. Aortic valve regurgitation is trivial. No aortic  stenosis is present. Pulmonic Valve: The pulmonic valve was normal in structure. Pulmonic valve regurgitation is not visualized. No evidence of pulmonic stenosis. Aorta: The aortic root is normal in size and structure. Venous: The inferior vena cava is normal in size with greater than 50% respiratory variability, suggesting right atrial pressure of 3 mmHg. IAS/Shunts: No atrial level shunt detected by color flow Doppler. Additional Comments: No apical thrombus on Definity echocontrast images. There is a large pleural effusion in the left lateral region.  LEFT VENTRICLE PLAX 2D LVIDd:         4.10 cm  Diastology LVIDs:         2.80 cm  LV e' medial:    5.44 cm/s LV PW:         1.30 cm  LV E/e' medial:  17.2 LV IVS:        1.20 cm  LV e' lateral:   7.94 cm/s LVOT diam:     2.00 cm  LV E/e' lateral: 11.8 LV SV:         33 LV SV Index:   18 LVOT Area:     3.14 cm  RIGHT VENTRICLE RV Basal diam:  3.30 cm RV S prime:     6.96 cm/s TAPSE (M-mode): 2.9 cm LEFT ATRIUM             Index       RIGHT ATRIUM           Index LA diam:        4.60 cm 2.48 cm/m  RA Area:     18.00 cm LA Vol (A2C):   69.7 ml 37.50 ml/m RA Volume:   47.80 ml  25.72 ml/m LA Vol (A4C):   75.7 ml 40.73 ml/m LA Biplane Vol: 74.7 ml 40.19 ml/m  AORTIC VALVE LVOT Vmax:   71.10 cm/s LVOT Vmean:  44.800 cm/s LVOT VTI:    0.105 m  AORTA Ao Root diam: 2.70 cm MITRAL VALVE               TRICUSPID VALVE MV Area (PHT): 3.77 cm    TR Peak grad:   32.7 mmHg MV Decel Time: 201 msec    TR Vmax:        286.00 cm/s MV E velocity: 93.60 cm/s                            SHUNTS  Systemic VTI:  0.10 m                            Systemic Diam: 2.00 cm Ena Dawley MD Electronically signed by Ena Dawley MD Signature Date/Time: 05/04/2020/11:16:53 AM    Final    CT HEAD CODE STROKE WO CONTRAST  Addendum Date: 05/03/2020   ADDENDUM REPORT: 05/03/2020 14:38 ADDENDUM: Exam and technique should not include CT perfusion, which was  performed subsequently and dictated separately. Only CT head without contrast and CT angiography of the head and neck with contrast performed. Electronically Signed   By: Macy Mis M.D.   On: 05/03/2020 14:38   Result Date: 05/03/2020 CLINICAL DATA:  Code stroke.  Right-sided weakness EXAM: CT HEAD WITHOUT CONTRAST CT ANGIOGRAPHY HEAD AND NECK CT PERFUSION BRAIN TECHNIQUE: Contiguous axial images were obtained the skull base to the vertex without contrast. Multidetector CT imaging of the head and neck was performed using the standard protocol during bolus administration of intravenous contrast. Multiplanar CT image reconstructions and MIPs were obtained to evaluate the vascular anatomy. Carotid stenosis measurements (when applicable) are obtained utilizing NASCET criteria, using the distal internal carotid diameter as the denominator. Multiphase CT imaging of the brain was performed following IV bolus contrast injection. Subsequent parametric perfusion maps were calculated using RAPID software. CONTRAST:  52mL OMNIPAQUE IOHEXOL 350 MG/ML SOLN COMPARISON:  None. FINDINGS: CT HEAD Brain: There is no acute intracranial hemorrhage, mass effect, or edema. Gray-white differentiation is preserved. Patchy and confluent areas hypoattenuation in the supratentorial white but may reflect moderate chronic microvascular ischemic changes. There is no extra-axial fluid collection. Prominence of the ventricles and sulci reflects generalized parenchymal volume loss. Vascular: No hyperdense vessel. Skull: Calvarium is unremarkable. Sinuses/Orbits: No acute finding. Other: None. ASPECTS ([place] Stroke Program Early CT Score) - Ganglionic level infarction (caudate, lentiform nuclei, internal capsule, insula, M1-M3 cortex): 7 - Supraganglionic infarction (M4-M6 cortex): 3 Total score (0-10 with 10 being normal): 10 CTA NECK Aortic arch: Great vessel origins are patent. Right carotid system: Patent. Mild calcified plaque at the  ICA origin causing minimal stenosis. Left carotid system: Patent. Minimal calcified plaque at the proximal ICA without measurable stenosis. Vertebral arteries: Patent.  Right vertebral artery is dominant. Skeleton: Degenerative changes of the cervical spine primarily C6-C7. Other neck: Heterogeneous, multinodular thyroid. Further ultrasound follow-up is not recommended by current guidelines. Upper chest: Partially imaged small bilateral pleural effusions. Peribronchial and interstitial thickening likely reflecting interstitial edema. Review of the MIP images confirms the above findings CTA HEAD Anterior circulation: Intracranial internal carotid arteries are patent with mild calcified plaque. Left M1 MCA is patent. There is occlusion of a M2 MCA branch approximately 13 mm from the MCA trifurcation (see saved key image series 11, 27). There is partial reconstitution followed by occlusion and subsequent distal M3 reconstitution. Right middle and both anterior cerebral arteries are patent. Posterior circulation: Intracranial vertebral arteries are patent with minimal calcified plaque on the right. Basilar artery is patent. Posterior cerebral arteries are patent. There are bilateral posterior communicating arteries present. Venous sinuses: Patent as allowed by contrast bolus timing. Review of the MIP images confirms the above findings IMPRESSION: No acute intracranial hemorrhage. No acute infarction identified. ASPECT score is 10. 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 No hemodynamically significant stenosis the neck. Partially imaged mild interstitial pulmonary edema and small bilateral pleural effusions. Results were called by telephone at the time of interpretation  on 05/03/2020 at 1:09 pm and again at 1:17 p.m. to provider [facility] , who verbally acknowledged these results. Electronically Signed: By: Macy Mis M.D. On: 05/03/2020 13:32    Labs:  CBC: Recent Labs     05/03/20 1254 05/03/20 1259 05/03/20 1320 05/03/20 1813 05/04/20 0317 05/05/20 0051  WBC 13.4*  --   --  15.6* 19.2* 16.2*  HGB 11.5*   < > 11.2* 10.6* 10.9* 10.2*  HCT 37.6   < > 33.0* 36.0 37.4 34.8*  PLT 338  --   --  414* 407* 335   < > = values in this interval not displayed.    COAGS: Recent Labs    02/07/20 1119 05/03/20 1317  INR 3.3* 1.2  APTT  --  32    BMP: Recent Labs    09/16/19 1136 09/16/19 1136 12/28/19 1136 12/28/19 1136 02/07/20 1119 05/03/20 1259 05/03/20 1317 05/03/20 1320 05/04/20 0317 05/05/20 0051  NA 139   < > 141  --  138   < > 131* 136 136 135  K 4.3   < > 4.1   < > 4.1   < > 3.7 3.8 4.2 3.8  CL 104   < > 106   < > 108   < > 102 101 107 106  CO2 23   < > 21   < > 21*  --  22  --  17* 19*  GLUCOSE 100*   < > 109*  --  127*   < > 132* 127* 194* 145*  BUN 11   < > 12  --  14   < > 11 9 12 15   CALCIUM 9.6   < > 9.2   < > 9.2  --  8.6*  --  8.8* 8.5*  CREATININE 0.83   < > 1.09*   < > 0.70   < > 0.64 0.60 0.82 0.76  GFRNONAA 68   < > 48*   < > >60  --  >60  --  >60 >60  GFRAA 78  --  56*  --  >60  --   --   --   --   --    < > = values in this interval not displayed.    LIVER FUNCTION TESTS: Recent Labs    09/16/19 1136 02/07/20 1119 05/03/20 1317  BILITOT 0.2 0.3 0.5  AST 17 20 15   ALT 11 18 12   ALKPHOS 68 58 52  PROT 6.3 6.5 5.7*  ALBUMIN 4.0 3.5 3.0*    Assessment and Plan:  History of acute CVA s/p cerebral arteriogram with emergent mechanical thrombectomy of left MCA superior division M2 occlusion achieving a TICI 2c revascularization via right femoral approach 05/03/2020 by Dr. Estanislado Pandy (also found to have recanalizing right ACA A2/A3 junction filling note on this exam). Patient's condition stable- still with expressive aphasia but able to answer yes/no questions appropriately, can spontaneously move left side, can wiggle right toes, no spontaneous movements of RUE. Right femoral puncture site stable, distal pulses (DPs) 1+  bilaterally. Plan to follow-up with Dr. Estanislado Pandy in clinic 6 weeks after discharge ([facility] schedulers to call patient to set up this appointment). Further plans per neurology/CCM/cardiology- appreciate and agree with management. Please call [facility] with questions/concerns.   Electronically Signed: Earley Abide, PA-C 05/05/2020, 10:44 AM   I spent a total of 25 Minutes at the the patient's bedside AND on the patient's hospital floor or unit, greater than 50% of which  was counseling/coordinating care for CVA s/p revascularization.

## 2020-05-05 NOTE — Progress Notes (Signed)
STROKE TEAM PROGRESS NOTE   INTERVAL HISTORY No family at bedside. Pt awake alert but has mild SOB. Overnight has afib RVR and HR has been difficult to control. Also found to have elevated troponin, but serial troponin seems flat out. Currently still on amiodarone drip. Will call cardiology for consult.   OBJECTIVE Vitals:   05/05/20 0600 05/05/20 0700 05/05/20 0800 05/05/20 0900  BP: (!) 137/98 134/67 (!) 139/107 119/73  Pulse: (!) 134 (!) 101 (!) 28 (!) 117  Resp: (!) 29 18 (!) 21 (!) 23  Temp:   98 F (36.7 C)   TempSrc:   Oral   SpO2: 92% 96% 96% 96%    CBC:  Recent Labs  Lab 05/03/20 1254 05/03/20 1259 05/04/20 0317 05/05/20 0051  WBC 13.4*   < > 19.2* 16.2*  NEUTROABS 10.9*  --  17.5*  --   HGB 11.5*   < > 10.9* 10.2*  HCT 37.6   < > 37.4 34.8*  MCV 77.8*   < > 82.4 78.6*  PLT 338   < > 407* 335   < > = values in this interval not displayed.    Basic Metabolic Panel:  Recent Labs  Lab 05/04/20 0317 05/04/20 0442 05/05/20 0051 05/05/20 0456  NA 136  --  135  --   K 4.2  --  3.8  --   CL 107  --  106  --   CO2 17*  --  19*  --   GLUCOSE 194*  --  145*  --   BUN 12  --  15  --   CREATININE 0.82  --  0.76  --   CALCIUM 8.8*  --  8.5*  --   MG  --  1.7  --  2.0    Lipid Panel:     Component Value Date/Time   CHOL 173 05/04/2020 0317   CHOL 226 (H) 09/16/2019 1136   CHOL 173 01/06/2013 1354   TRIG 65 05/04/2020 0317   TRIG 120 05/06/2013 1341   TRIG 95 01/06/2013 1354   HDL 36 (L) 05/04/2020 0317   HDL 43 09/16/2019 1136   HDL 47 05/06/2013 1341   HDL 41 01/06/2013 1354   CHOLHDL 4.8 05/04/2020 0317   VLDL 13 05/04/2020 0317   LDLCALC 124 (H) 05/04/2020 0317   LDLCALC 158 (H) 09/16/2019 1136   LDLCALC 109 (H) 05/06/2013 1341   LDLCALC 113 (H) 01/06/2013 1354   HgbA1c:  Lab Results  Component Value Date   HGBA1C 6.4 (H) 05/04/2020   Urine Drug Screen: No results found for: LABOPIA, COCAINSCRNUR, LABBENZ, AMPHETMU, THCU, LABBARB  Alcohol  Level     Component Value Date/Time   ETH <10 05/03/2020 1317    IMAGING  CT HEAD CODE STROKE WO CONTRAST CT Angio Head W or Wo Contrast CT Angio Neck W and/or Wo Contrast 05/03/2020   IMPRESSION:   No acute intracranial hemorrhage. No acute infarction identified.   ASPECT score is 10.   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   No hemodynamically significant stenosis the neck.   Partially imaged mild interstitial pulmonary edema and small bilateral pleural effusions.  MR BRAIN WO CONTRAST 05/03/2020 IMPRESSION:  1. 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 Associated minimal petechial hemorrhage at the left temporal region without significant mass effect.  2. Underlying age-related cerebral atrophy with moderate chronic microvascular ischemic disease.   MR  ANGIO HEAD WO CONTRAST 05/03/2020 IMPRESSION:  1. Technically limited exam due to motion artifact.  2. Previously identified occluded left M2 branch appears grossly patent status post revascularization.  3. Otherwise grossly stable and negative intracranial MRA. No other large vessel occlusion. No other hemodynamically significant or correctable stenosis.   CT CEREBRAL PERFUSION W CONTRAST 05/03/2020 IMPRESSION:  No core infarction identified. 67 mL of penumbra in the posterior left MCA territory.  DG CHEST PORT 1 VIEW 05/03/2020 IMPRESSION:  Vascular congestion with mild parenchymal edema on the right consistent with congestive failure.  Neuro Interventional Radiology - Cerebral Angiogram with Intervention - Dr Estanislado Pandy 05/03/2020 4:55 PM RT common carotid arteriogram followed by revascularization of occluded sup division M2 branch with x 2 passes with Contact aspiration using Zoom 35 and 55 aspiration catheters and partially of  the distal M3 region of inf division with mechanical thrombolysis achieving a TICI 2C  revascularization. Recanalizing Rt ACA A2/A3 junction filling defect noted .  Transthoracic Echocardiogram  1. Left ventricular ejection fraction, by estimation, is 55 to 60%. The left ventricle has normal function. The left ventricle has no regional wall motion abnormalities. There is moderate concentric left ventricular hypertrophy. Left ventricular diastolic function could not be evaluated.  2. Right ventricular systolic function is mildly reduced. The right ventricular size is moderately enlarged. There is moderately elevated pulmonary artery systolic pressure. The estimated right ventricular systolic pressure is 54.2 mmHg.  3. Left atrial size was moderately dilated.  4. Right atrial size was severely dilated.  5. A small pericardial effusion is present. The pericardial effusion is posterior to the left ventricle. Large pleural effusion in the left lateral region.  6. The mitral valve is normal in structure. Moderate mitral valve regurgitation. No evidence of mitral stenosis.  7. Tricuspid valve regurgitation is moderate to severe.  8. The aortic valve is normal in structure. Aortic valve regurgitation is trivial. No aortic stenosis is present.  9. The inferior vena cava is normal in size with greater than 50% respiratory variability, suggesting right atrial pressure of 3 mmHg.   ECG - atrial fibrillation - ventricular response 131 BPM (See cardiology reading for complete details)  EEG - This study is suggestive of cortical dysfunction in left hemisphere, maximal left temporal region consistent with underlying stroke.  No seizures or definite epileptiform discharges were seen throughout the recording.   PHYSICAL EXAM   Temp:  [97.4 F (36.3 C)-99.2 F (37.3 C)] 98 F (36.7 C) (11/05 0800) Pulse Rate:  [28-134] 117 (11/05 0900) Resp:  [17-29] 23 (11/05 0900) BP: (93-155)/(57-107) 119/73 (11/05 0900) SpO2:  [88 %-97 %] 96 % (11/05 0900)  General - Well nourished, well  developed, in no apparent distress, lethargic.  Ophthalmologic - fundi not visualized due to noncooperation.  Cardiovascular - irregularly irregular heart rate and rhythm.  Neuro - awake, eyes open, global aphasia, but able to say "yes", "no", able to follow most midline and peripheral commands on the left, not able to name but able to repeat 3-word sentence but not 5-word sentence. Left gaze preference, but able to gaze bilaterally. Inconsistently blinking to visual threat bilaterally. PERRL, no significant facial droop. Tongue protrusion not cooperative. LUE strong and active, no drift. LLE brisk movement 3/5 on pain. RUE 3-/5 today. RLE mild withdraw to pain 3-/5. Sensation, coordination not cooperative and gait not tested.    ASSESSMENT/PLAN Sheila Ray is a 79 y.o. 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 She did not receive IV t-PA due to anticoagulation with Xarelto. IR - Cerebral Angiogram with Intervention - 05/03/20 - revascularization of occluded sup division M2 branch.  Stroke: Lt MCA scattered infarcts due to left M2 occlusion s/p [id number] - embolic - likely from Afib even on Xarelto.  CT Head - No acute intracranial hemorrhage. No acute infarction identified. ASPECT score is 10.      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 No hemodynamically significant stenosis the neck.   CT Perfusion - No core infarction identified. 67 mL of penumbra in the posterior left MCA territory.  MRI head x 2 - Patchy acute ischemic infarcts involving the MCA and ACA distributions, predominantly cortical in nature.    MRA head x 2 -  Previously identified occluded left M2 branch appears grossly patent status post  revascularization.   EEG - left cortical dysfunction, no seizure  2D Echo - EF 55-60%  [employer] Virus 2 - negative  LDL - 124  HgbA1c - 6.4  VTE prophylaxis - Mina Heparin  Xarelto (rivaroxaban) daily prior to admission, now on ASA 325mg  daily. May consider resume AC 3-5 days post stroke.  Patient counseled to be compliant with her antithrombotic medications  Ongoing aggressive stroke risk factor management  Therapy recommendations:  pending  Disposition:  Pending  Afib RVR  On amiodarone IV drip  On metoprolol PRN  On Xarelto PTA  Now on ASA 325. Will consider to resume AC in 3-5 days post stroke  rate difficult to control - Cardiology consulted, appreciate help  On amio gtt and po cardizem   Elevated troponin  Trop 68->63->73  Cardiology on board  Considering related to Afib instead of ischemia  Hypertension  Home BP meds: Toprol ; Zestril  Current BP meds: metoprolol prn and deltizem  Blood pressure stable  . Long-term BP goal normotensive  Hyperlipidemia  Home Lipid lowering medication: none   LDL 124, goal < 70  Put on zetia  Hx of statin intolerance (Lipitor ; Pravastatin ; Crestor)  Diabetes  Home diabetic meds: metformin  Current diabetic meds: SSI   HgbA1c 6.4, goal < 7.0  CBG monitoring  PCP follow up  Other Stroke Risk Factors  Advanced age  Obesity, recommend weight loss, diet and exercise as appropriate   Hx stroke/TIA - left thalamic stroke (no details)  Other Active Problems  Code status - Full code   Leukocytosis - WBC's -  13.4->15.6->19.2->16.2 (afebrile)   Hospital day # 2  This patient is critically ill due to afib RVR, elevated trop, left MCA infarct with tICA occlusion s/p thrombectomy, CHF and at significant risk of neurological worsening, death form heart failure, seizure, recurrent stroke, hemorrhagic conversion. This patient's care requires constant monitoring of vital signs, hemodynamics,  respiratory and cardiac monitoring, review of multiple databases, neurological assessment, discussion with family, other specialists and medical decision making of high complexity. I spent 35 minutes of neurocritical care time in the care of this patient. I discussed with CCM Dr. Tacy Learn.   Rosalin Hawking, MD PhD Stroke Neurology 05/05/2020 9:37 AM  To contact Stroke Continuity provider, please refer to [email or web address] After hours, contact General Neurology

## 2020-05-05 NOTE — Progress Notes (Signed)
OT Cancellation Note  Patient Details Name: BRANDILYNN TAORMINA MRN: [id number] DOB: 09-09-1940   Cancelled Treatment:    Reason Eval/Treat Not Completed: Patient not medically ready. MD requesting to hold until HR < 100. Will follow up later time.  Ramond Dial, OT/L   Acute OT Clinical Specialist Acute Rehabilitation Services Pager [phone] Office [phone]  05/05/2020, 8:47 [id number]}

## 2020-05-05 NOTE — Progress Notes (Addendum)
Awaiting result of troponin I prior to making decision on whether or not to start heparin. Discussed with Dr. Oletta Darter.   Addendum: Troponin came back significantly elevated at 68. If follow up troponin drawn 2 hours from initial draw (will need to obtain at 6:56 AM) is trending upwards most likely will start heparin. Discussed with Dr. Oletta Darter.   Electronically signed: Dr. Kerney Elbe

## 2020-05-05 NOTE — Discharge Instructions (Signed)
Femoral Site Care This sheet gives you information about how to care for yourself after your procedure. Your health care provider may also give you more specific instructions. If you have problems or questions, contact your health care provider. What can I expect after the procedure? After the procedure, it is common to have:  Bruising that usually fades within 1-2 weeks.  Tenderness at the site. Follow these instructions at home: Wound care 1. Follow instructions from your health care provider about how to take care of your insertion site. Make sure you: ? Wash your hands with soap and water before you change your bandage (dressing). If soap and water are not available, use hand sanitizer. ? Change your dressing as directed- pressure dressing removed 24 hours post-procedure (and switch for bandaid), bandaid removed 72 hours post-procedure 2. Do not take baths, swim, or use a hot tub for 7 days post-procedure. 3. You may shower 48 hours after the procedure or as told by your health care provider. ? Gently wash the site with plain soap and water. ? Pat the area dry with a clean towel. ? Do not rub the site. This may cause bleeding. 4. Check your site every day for signs of infection. Check for: ? Redness, swelling, or pain. ? Fluid or blood. ? Warmth. ? Pus or a bad smell. Activity  Do not stoop, bend, or lift anything that is heavier than 10 lb (4.5 kg) for 2 weeks post-procedure.  Do not drive self for 2 weeks post-procedure. Contact a health care provider if you have:  A fever or chills.  You have redness, swelling, or pain around your insertion site. Get help right away if:  The catheter insertion area swells very fast.  You pass out.  You suddenly start to sweat or your skin gets clammy.  The catheter insertion area is bleeding, and the bleeding does not stop when you hold steady pressure on the area.  The area near or just beyond the catheter insertion site becomes  pale, cool, tingly, or numb. These symptoms may represent a serious problem that is an emergency. Do not wait to see if the symptoms will go away. Get medical help right away. Call your local emergency services (911 in the [place] Do not drive yourself to the hospital.  This information is not intended to replace advice given to you by your health care provider. Make sure you discuss any questions you have with your health care provider. Document Revised: 06/30/2017 Document Reviewed: 06/30/2017 Elsevier Patient Education  2020 Elsevier Inc. 

## 2020-05-05 NOTE — Progress Notes (Addendum)
[facility] Progress Note Patient Name: LYZBETH GENRICH DOB: [birth year] MRN: [id number]   Date of Service  05/05/2020  HPI/Events of Note  AFIB with RVR - Ventricular rate = 145. BP = 131/85. K+ = 3.8 and Creatinine = 0.76. Patient c/o chest pain. Demand ischemia?  Increased RR = 34 and increased O2 requirement.   eICU Interventions  Plan: 1. Bolus with Amiodarone 150 mg IV over 10 minutes now.  2. Replace K+. 3. Mg++ level STAT. 4. Portable CXR STAT. 5. 12 Lead EKG STAT. 6. Cycle Troponin.  7. Will request that ground team evaluate the patient at bedside.      Intervention Category Major Interventions: Arrhythmia - evaluation and management  Valdemar Mcclenahan [place] 05/05/2020, 3:31 AM

## 2020-05-06 ENCOUNTER — Other Ambulatory Visit: Payer: Self-pay

## 2020-05-06 ENCOUNTER — Inpatient Hospital Stay (HOSPITAL_COMMUNITY): Payer: Medicare Other

## 2020-05-06 DIAGNOSIS — R609 Edema, unspecified: Secondary | ICD-10-CM | POA: Diagnosis not present

## 2020-05-06 LAB — GLUCOSE, CAPILLARY
Glucose-Capillary: 127 mg/dL — ABNORMAL HIGH (ref 70–99)
Glucose-Capillary: 142 mg/dL — ABNORMAL HIGH (ref 70–99)
Glucose-Capillary: 148 mg/dL — ABNORMAL HIGH (ref 70–99)
Glucose-Capillary: 149 mg/dL — ABNORMAL HIGH (ref 70–99)
Glucose-Capillary: 154 mg/dL — ABNORMAL HIGH (ref 70–99)
Glucose-Capillary: 200 mg/dL — ABNORMAL HIGH (ref 70–99)

## 2020-05-06 LAB — CBC
HCT: 32.5 % — ABNORMAL LOW (ref 36.0–46.0)
Hemoglobin: 10 g/dL — ABNORMAL LOW (ref 12.0–15.0)
MCH: 23.1 pg — ABNORMAL LOW (ref 26.0–34.0)
MCHC: 30.8 g/dL (ref 30.0–36.0)
MCV: 75.2 fL — ABNORMAL LOW (ref 80.0–100.0)
Platelets: 335 10*3/uL (ref 150–400)
RBC: 4.32 MIL/uL (ref 3.87–5.11)
RDW: 16.7 % — ABNORMAL HIGH (ref 11.5–15.5)
WBC: 11.1 10*3/uL — ABNORMAL HIGH (ref 4.0–10.5)
nRBC: 0 % (ref 0.0–0.2)

## 2020-05-06 LAB — BASIC METABOLIC PANEL
Anion gap: 9 (ref 5–15)
BUN: 17 mg/dL (ref 8–23)
CO2: 19 mmol/L — ABNORMAL LOW (ref 22–32)
Calcium: 8.4 mg/dL — ABNORMAL LOW (ref 8.9–10.3)
Chloride: 101 mmol/L (ref 98–111)
Creatinine, Ser: 0.72 mg/dL (ref 0.44–1.00)
GFR, Estimated: >60 mL/min (ref >60)
Glucose, Bld: 148 mg/dL — ABNORMAL HIGH (ref 70–99)
Potassium: 3.5 mmol/L (ref 3.5–5.1)
Sodium: 129 mmol/L — ABNORMAL LOW (ref 135–145)

## 2020-05-06 MED ORDER — POTASSIUM CHLORIDE 10 MEQ/100ML IV SOLN
10.0000 meq | INTRAVENOUS | Status: AC
  Administered 2020-05-06 (×4): 10 meq via INTRAVENOUS
  Filled 2020-05-06 (×4): qty 100

## 2020-05-06 MED ORDER — METOPROLOL TARTRATE 50 MG PO TABS
50.0000 mg | ORAL_TABLET | Freq: Two times a day (BID) | ORAL | Status: DC
  Administered 2020-05-06 – 2020-05-10 (×8): 50 mg via ORAL
  Filled 2020-05-06 (×8): qty 1

## 2020-05-06 NOTE — Progress Notes (Signed)
VASCULAR LAB    Right upper extremity venous duplex has been performed.  See CV proc for preliminary results.  Messaged Dr. Erlinda Hong and Dr. Jaynee Eagles with results.  Maraki Macquarrie, RVT 05/06/2020, 2:41 PM

## 2020-05-06 NOTE — Progress Notes (Signed)
NAME:  Sheila Ray, MRN:  [id number], DOB:  [birth year], LOS: 3 ADMISSION DATE:  05/03/2020, CONSULTATION DATE:  05/03/20 REFERRING MD:  [place]  CHIEF COMPLAINT:  A.fib RVR   Brief History   Sheila Ray is a 79 y.o. female who was admitted 11/3 with left M2/M3 occlusion s/p neuro IR revascularization.  Extubated post procedure then had A.fib with RVR (has underlying A.fib);  Past Medical History  has Vitamin D deficiency; Obesity (BMI 30-39.9); DM (diabetes mellitus) ([place]); Hypertension associated with diabetes ([place]); Hyperlipidemia associated with type 2 diabetes mellitus ([place]); Osteoporosis; Stroke [facility]); Hypothyroid; OA (osteoarthritis) of knee; Atrial fibrillation ([place]); Acute ischemic left MCA stroke ([place]); Middle cerebral artery embolism, left; Hypoxia; and Encephalopathy acute on their problem list.  [facility] Events   11/3 > admit.  Consults:  Neuro, PCCM.  Procedures:  ETT 11/3 > 11/3. L radial art line 11/3 >   Significant Diagnostic Tests:  CT / CTA head 11/3 > no hemorrhage or acute infarction.  Occlusion of left M2 MCA branch. CTP brain 11/3 > no core infarction.  63ml penumbra in posterior left MCA territory. CT head 11/3 >  MRI brain 11/4 >  Echo 11/4 >   Micro Data:  Flu 11/3 > neg. RSV 11/3 > neg. COVID 11/3 > neg.  Antimicrobials:  None.   Interim history/subjective:  Patient's heart rate is better controlled in the 80s to 90s.  She is much more alert and awake, continues to have aphasia/word finding difficulty  Objective:  Blood pressure 119/77, pulse 97, temperature 98.8 F (37.1 C), temperature source Oral, resp. rate (!) 29, SpO2 94 %.        Intake/Output Summary (Last 24 hours) at 05/06/2020 1034 Last data filed at 05/06/2020 1000 Gross per 24 hour  Intake 707.5 ml  Output 2850 ml  Net -2142.5 ml   There were no vitals filed for this visit.  Examination: General: Elderly Caucasian female, lying in the bed Neuro: Awake,  continued to have word finding difficulties, intermittently following simple commands.  Left side is antigravity, right upper extremity distal movements are better than proximal, right lower extremity is antigravity with drift  HEENT: [place]/AT. Sclerae anicteric.  EOMI. Cardiovascular: RRR, no M/R/G.  Lungs: Bilateral coarse breath sounds, no wheezes or rhonchi  Abdomen: BS x 4, soft, NT/ND.  Musculoskeletal: No gross deformities, right upper extremity is swollen and edematous Skin: Intact, warm, no rashes.  Assessment & Plan:   Acute left MCA stroke status post thrombectomy  Patient's right-sided weakness is slightly better than yesterday she has distal movement of right upper extremity and antigravity on the right lower extremity with expressive aphasia MRI confirmed left insular stroke and infarct in fronto temporal region Continue aspirin and statin Continue secondary stroke prophylaxis  HTN and HLD. Continue statin Holding lisinopril  Acute encephalopathy due to stroke Patient's mental status is better today, continue to have expressive aphasia Continue to monitor  Atrial fibrillation with RVR Patient's heart rate is better controlled Cardiology input is appreciated Continue diltiazem 90 mg every 6 hours Added metoprolol 50 mg every 12 hours We will try to stop amnioinfusion later today Hold anticoagulation for now for secondary stroke prophylaxis  Demand cardiac ischemia Due to rapid ventricular response from A. fib Continue to monitor serum troponin Echocardiogram showed no wall motion abnormalities, with normal EF  Hypothyroidism. TSH is low normal Free T3 and T4 is pending  Hx DM.  SSI. Hold home metformin.   Best Practice:  Diet: Heart healthy diet Pain/Anxiety/Delirium protocol (if indicated): N/A. VAP protocol (if indicated): N/A. DVT prophylaxis: Subcu Heparin. GI prophylaxis: N/A. Glucose control: SSI. Mobility: PT OT Code Status: Full. Family  Communication: Per primary team Disposition: ICU.  Labs   CBC: Recent Labs  Lab 05/03/20 1254 05/03/20 1259 05/03/20 1320 05/03/20 1813 05/04/20 0317 05/05/20 0051 05/06/20 0452  WBC 13.4*  --   --  15.6* 19.2* 16.2* 11.1*  NEUTROABS 10.9*  --   --   --  17.5*  --   --   HGB 11.5*   < > 11.2* 10.6* 10.9* 10.2* 10.0*  HCT 37.6   < > 33.0* 36.0 37.4 34.8* 32.5*  MCV 77.8*  --   --  78.6* 82.4 78.6* 75.2*  PLT 338  --   --  414* 407* 335 335   < > = values in this interval not displayed.   Basic Metabolic Panel: Recent Labs  Lab 05/03/20 1317 05/03/20 1320 05/04/20 0317 05/04/20 0442 05/05/20 0051 05/05/20 0456 05/06/20 0452  NA 131* 136 136  --  135  --  129*  K 3.7 3.8 4.2  --  3.8  --  3.5  CL 102 101 107  --  106  --  101  CO2 22  --  17*  --  19*  --  19*  GLUCOSE 132* 127* 194*  --  145*  --  148*  BUN 11 9 12   --  15  --  17  CREATININE 0.64 0.60 0.82  --  0.76  --  0.72  CALCIUM 8.6*  --  8.8*  --  8.5*  --  8.4*  MG  --   --   --  1.7  --  2.0  --    GFR: CrCl cannot be calculated (Unknown ideal weight.). Recent Labs  Lab 05/03/20 1813 05/04/20 0317 05/05/20 0051 05/06/20 0452  WBC 15.6* 19.2* 16.2* 11.1*   Liver Function Tests: Recent Labs  Lab 05/03/20 1317  AST 15  ALT 12  ALKPHOS 52  BILITOT 0.5  PROT 5.7*  ALBUMIN 3.0*   No results for input(s): LIPASE, AMYLASE in the last 168 hours. No results for input(s): AMMONIA in the last 168 hours. ABG    Component Value Date/Time   TCO2 22 05/03/2020 1320    Coagulation Profile: Recent Labs  Lab 05/03/20 1317  INR 1.2   Cardiac Enzymes: No results for input(s): CKTOTAL, CKMB, CKMBINDEX, TROPONINI in the last 168 hours. HbA1C: HB A1C (BAYER DCA - WAIVED)  Date/Time Value Ref Range Status  03/12/2019 09:29 AM 6.4 <7.0 % Final    Comment:                                          Diabetic Adult            <7.0                                       Healthy Adult        4.3 - 5.7                                                            (  DCCT/NGSP) American Diabetes Association's Summary of Glycemic Recommendations for Adults with Diabetes: Hemoglobin A1c <7.0%. More stringent glycemic goals (A1c <6.0%) may further reduce complications at the cost of increased risk of hypoglycemia.   10/29/2018 08:42 AM 6.6 <7.0 % Final    Comment:                                          Diabetic Adult            <7.0                                       Healthy Adult        4.3 - 5.7                                                           (DCCT/NGSP) American Diabetes Association's Summary of Glycemic Recommendations for Adults with Diabetes: Hemoglobin A1c <7.0%. More stringent glycemic goals (A1c <6.0%) may further reduce complications at the cost of increased risk of hypoglycemia.    Hgb A1c MFr Bld  Date/Time Value Ref Range Status  05/04/2020 03:17 AM 6.4 (H) 4.8 - 5.6 % Final    Comment:    (NOTE) Pre diabetes:          5.7%-6.4%  Diabetes:              >6.4%  Glycemic control for   <7.0% adults with diabetes    CBG: Recent Labs  Lab 05/05/20 1645 05/05/20 1937 05/06/20 0038 05/06/20 0322 05/06/20 0713  GLUCAP 179* 156* 149* 142* 148*   Total critical care time: 38 minutes  Performed by: [place] care time was exclusive of separately billable procedures and treating other patients.   Critical care was necessary to treat or prevent imminent or life-threatening deterioration.   Critical care was time spent personally by me on the following activities: development of treatment plan with patient and/or surrogate as well as nursing, discussions with consultants, evaluation of patient's response to treatment, examination of patient, obtaining history from patient or surrogate, ordering and performing treatments and interventions, ordering and review of laboratory studies, ordering and review of radiographic studies, pulse oximetry and re-evaluation  of patient's condition.   Jacky Kindle MD Critical care physician [facility]  Pager: [phone] Mobile: [phone]

## 2020-05-06 NOTE — Evaluation (Signed)
Physical Therapy Evaluation Patient Details Name: Sheila Ray MRN: [id number] DOB: [birth year] Today's Date: 05/06/2020   History of Present Illness  The pt is a 79 yo female presenting with R-sided facial droop, mild dysarthria, confusion and rapid A fib. 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  Imaging showing patchy acute ischemic infarcts involving the L MCA and ACA distributions.  Clinical Impression  Pt admitted with/for R sided weakness due to infarcts L MCA/ACA.  Pt needing total assist of 2 for mobility at this time..  Pt currently limited functionally due to the problems listed. ( See problems list.)   Pt will benefit from PT to maximize function and safety in order to get ready for next venue listed below.     Follow Up Recommendations CIR (unless can't get 24 hour assist at home)    Equipment Recommendations  Other (comment) (TBA)    Recommendations for Other Services Rehab consult     Precautions / Restrictions Precautions Precautions: Fall      Mobility  Bed Mobility Overal bed mobility: Needs Assistance Bed Mobility: Supine to Sit;Sit to Supine     Supine to sit: Total assist;+2 for physical assistance Sit to supine: Total assist;+2 for physical assistance   General bed mobility comments: up via L elbow.  pt tending to overcompensate with L UE force list to the right, but could easily be inhibited    Transfers                 General transfer comment: NT  Ambulation/Gait                Stairs            Wheelchair Mobility    Modified Rankin (Stroke Patients Only) Modified Rankin (Stroke Patients Only) Pre-Morbid Rankin Score: No symptoms Modified Rankin: Severe disability     Balance                                             Pertinent Vitals/Pain Pain Assessment: Faces Faces Pain Scale: No hurt Pain Intervention(s): Monitored during session    Home Living  Family/patient expects to be discharged to:: Private residence Living Arrangements: Alone   Type of Home: Other(Comment) (condo) Home Access: Stairs to enter Entrance Stairs-Rails: Doctor, general practice of Steps: 3 Home Layout: One level Home Equipment: None Additional Comments: TBD further    Prior Function Level of Independence: Independent         Comments: drove, I in ADL's and iADL's     Hand Dominance   Dominant Hand: Right    Extremity/Trunk Assessment   Upper Extremity Assessment Upper Extremity Assessment: Defer to OT evaluation (moves fingers on the R )    Lower Extremity Assessment Lower Extremity Assessment: RLE deficits/detail RLE Deficits / Details: no voluntary movement, noted associated movement with movement on the L RLE Sensation:  (pt reports similar sensation from L to R side) RLE Coordination: decreased gross motor;decreased fine motor       Communication   Communication: Receptive difficulties;Expressive difficulties (expressive worse than receptive)  Cognition Arousal/Alertness: Awake/alert Behavior During Therapy: WFL for tasks assessed/performed Overall Cognitive Status: Difficult to assess  General Comments General comments (skin integrity, edema, etc.): vss    Exercises Other Exercises Other Exercises: warm up PROM to R side   Assessment/Plan    PT Assessment Patient needs continued PT services  PT Problem List Decreased strength;Decreased activity tolerance;Decreased balance;Decreased mobility;Decreased coordination       PT Treatment Interventions DME instruction;Gait training;Functional mobility training;Therapeutic activities;Therapeutic exercise;Balance training;Neuromuscular re-education;Patient/family education    PT Goals (Current goals can be found in the Care Plan section)  Acute Rehab PT Goals Patient Stated Goal: be able to get back home PT Goal  Formulation: With patient Time For Goal Achievement: 05/13/20 Potential to Achieve Goals: Fair    Frequency Min 3X/week   Barriers to discharge        Co-evaluation               AM-PAC PT "6 Clicks" Mobility  Outcome Measure Help needed turning from your back to your side while in a flat bed without using bedrails?: Total Help needed moving from lying on your back to sitting on the side of a flat bed without using bedrails?: Total Help needed moving to and from a bed to a chair (including a wheelchair)?: Total Help needed standing up from a chair using your arms (e.g., wheelchair or bedside chair)?: Total Help needed to walk in hospital room?: Total Help needed climbing 3-5 steps with a railing? : Total 6 Click Score: 6    End of Session   Activity Tolerance: Patient tolerated treatment well Patient left: in bed;with call bell/phone within reach;with bed alarm set;with SCD's reapplied Nurse Communication: Mobility status PT Visit Diagnosis: Hemiplegia and hemiparesis;Muscle weakness (generalized) (M62.81);Other abnormalities of gait and mobility (R26.89) Hemiplegia - Right/Left: Right Hemiplegia - dominant/non-dominant: Dominant Hemiplegia - caused by: Cerebral infarction    Time: 4034-7425 PT Time Calculation (min) (ACUTE ONLY): 28 min   Charges:   PT Evaluation $PT Eval Moderate Complexity: 1 Mod PT Treatments $Therapeutic Activity: 8-22 mins        05/06/2020  Jacinto Halim., PT Acute Rehabilitation Services [phone]  (pager) [phone]  (office)  [name] 05/06/2020, 6:10 PM

## 2020-05-06 NOTE — Progress Notes (Signed)
[facility] Progress Note Patient Name: AMERIE BEAUMONT DOB: 23-Aug-1940 MRN: [id number]   Date of Service  05/06/2020  HPI/Events of Note  K+ 3.5  eICU Interventions  KCL 10 meq iv Q 1 hour x 4 doses.        Kerry Kass Crystalina Stodghill 05/06/2020, 6:36 AM

## 2020-05-06 NOTE — Progress Notes (Signed)
STROKE TEAM PROGRESS NOTE   INTERVAL HISTORY  No family at bedside. She is off the amio drip, oral dilt for her Afib with RVR much improved HR. Cardiology was consulted.   OBJECTIVE Vitals:   05/06/20 1100 05/06/20 1200 05/06/20 1206 05/06/20 1300  BP: 112/66 100/82  94/74  Pulse: 78 70  67  Resp: (!) 32 (!) 34  20  Temp:      TempSrc:      SpO2: 93% (!) 88% 96% 98%    CBC:  Recent Labs  Lab 05/03/20 1254 05/03/20 1259 05/04/20 0317 05/04/20 0317 05/05/20 0051 05/06/20 0452  WBC 13.4*   < > 19.2*   < > 16.2* 11.1*  NEUTROABS 10.9*  --  17.5*  --   --   --   HGB 11.5*   < > 10.9*   < > 10.2* 10.0*  HCT 37.6   < > 37.4   < > 34.8* 32.5*  MCV 77.8*   < > 82.4   < > 78.6* 75.2*  PLT 338   < > 407*   < > 335 335   < > = values in this interval not displayed.    Basic Metabolic Panel:  Recent Labs  Lab 05/04/20 0317 05/04/20 0442 05/05/20 0051 05/05/20 0456 05/06/20 0452  NA   < >  --  135  --  129*  K   < >  --  3.8  --  3.5  CL   < >  --  106  --  101  CO2   < >  --  19*  --  19*  GLUCOSE   < >  --  145*  --  148*  BUN   < >  --  15  --  17  CREATININE   < >  --  0.76  --  0.72  CALCIUM   < >  --  8.5*  --  8.4*  MG  --  1.7  --  2.0  --    < > = values in this interval not displayed.    Lipid Panel:     Component Value Date/Time   CHOL 173 05/04/2020 0317   CHOL 226 (H) 09/16/2019 1136   CHOL 173 01/06/2013 1354   TRIG 65 05/04/2020 0317   TRIG 120 05/06/2013 1341   TRIG 95 01/06/2013 1354   HDL 36 (L) 05/04/2020 0317   HDL 43 09/16/2019 1136   HDL 47 05/06/2013 1341   HDL 41 01/06/2013 1354   CHOLHDL 4.8 05/04/2020 0317   VLDL 13 05/04/2020 0317   LDLCALC 124 (H) 05/04/2020 0317   LDLCALC 158 (H) 09/16/2019 1136   LDLCALC 109 (H) 05/06/2013 1341   LDLCALC 113 (H) 01/06/2013 1354   HgbA1c:  Lab Results  Component Value Date   HGBA1C 6.4 (H) 05/04/2020   Urine Drug Screen: No results found for: LABOPIA, COCAINSCRNUR, LABBENZ, AMPHETMU, THCU,  LABBARB  Alcohol Level     Component Value Date/Time   ETH <10 05/03/2020 1317    IMAGING  CT HEAD CODE STROKE WO CONTRAST CT Angio Head W or Wo Contrast CT Angio Neck W and/or Wo Contrast 05/03/2020   IMPRESSION:   No acute intracranial hemorrhage. No acute infarction identified.   ASPECT score is 10.   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   No hemodynamically significant stenosis the neck.   Partially imaged mild interstitial pulmonary edema and small bilateral pleural  effusions.  MR BRAIN WO CONTRAST 05/03/2020 IMPRESSION:  1. 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 Associated minimal petechial hemorrhage at the left temporal region without significant mass effect.  2. Underlying age-related cerebral atrophy with moderate chronic microvascular ischemic disease.   MR ANGIO HEAD WO CONTRAST 05/03/2020 IMPRESSION:  1. Technically limited exam due to motion artifact.  2. Previously identified occluded left M2 branch appears grossly patent status post revascularization.  3. Otherwise grossly stable and negative intracranial MRA. No other large vessel occlusion. No other hemodynamically significant or correctable stenosis.   CT CEREBRAL PERFUSION W CONTRAST 05/03/2020 IMPRESSION:  No core infarction identified. 67 mL of penumbra in the posterior left MCA territory.  DG CHEST PORT 1 VIEW 05/03/2020 IMPRESSION:  Vascular congestion with mild parenchymal edema on the right consistent with congestive failure.  Neuro Interventional Radiology - Cerebral Angiogram with Intervention - Dr Estanislado Pandy 05/03/2020 4:55 PM RT common carotid arteriogram followed by revascularization of occluded sup division M2 branch with x 2 passes with Contact aspiration using Zoom 35 and 55 aspiration catheters and partially of  the distal M3 region of inf division with mechanical thrombolysis  achieving a TICI 2C revascularization. Recanalizing Rt ACA A2/A3 junction filling defect noted .  Transthoracic Echocardiogram  1. Left ventricular ejection fraction, by estimation, is 55 to 60%. The left ventricle has normal function. The left ventricle has no regional wall motion abnormalities. There is moderate concentric left ventricular hypertrophy. Left ventricular diastolic function could not be evaluated.  2. Right ventricular systolic function is mildly reduced. The right ventricular size is moderately enlarged. There is moderately elevated pulmonary artery systolic pressure. The estimated right ventricular systolic pressure is 62.2 mmHg.  3. Left atrial size was moderately dilated.  4. Right atrial size was severely dilated.  5. A small pericardial effusion is present. The pericardial effusion is posterior to the left ventricle. Large pleural effusion in the left lateral region.  6. The mitral valve is normal in structure. Moderate mitral valve regurgitation. No evidence of mitral stenosis.  7. Tricuspid valve regurgitation is moderate to severe.  8. The aortic valve is normal in structure. Aortic valve regurgitation is trivial. No aortic stenosis is present.  9. The inferior vena cava is normal in size with greater than 50% respiratory variability, suggesting right atrial pressure of 3 mmHg.   ECG - atrial fibrillation - ventricular response 131 BPM (See cardiology reading for complete details)  EEG - This study is suggestive of cortical dysfunction in left hemisphere, maximal left temporal region consistent with underlying stroke.  No seizures or definite epileptiform discharges were seen throughout the recording.   PHYSICAL EXAM   Temp:  [97.9 F (36.6 C)-99.1 F (37.3 C)] 98.8 F (37.1 C) (11/06 0800) Pulse Rate:  [38-117] 67 (11/06 1300) Resp:  [15-34] 20 (11/06 1300) BP: (94-140)/(63-119) 94/74 (11/06 1300) SpO2:  [88 %-98 %] 98 % (11/06 1300)  General - Well  nourished, well developed, in no apparent distress, lethargic.  Ophthalmologic - fundi not visualized due to noncooperation.  Cardiovascular - irregularly irregular heart rate and rhythm.  Neuro - sleeping, eyes closed, global aphasia, but able to say "fine" when asked how she is, able to follow most midline and peripheral commands on the left, wiggles right toes and can grip on the right today, can repeat 3 words, cannot name when holding up thumb.     not able to name but able to repeat 3-word  sentence but not 5-word sentence. Left gaze preference, but able to gaze bilaterally. Inconsistently blinking to visual threat right but consistent on the left . PERRL, no significant facial droop. Tongue protrusion can stick tongue out with verbal command is midline. LUE strong and active, no drift. LLE brisk movement 3/5 on pain. RUE 3-/5 today. RLE mild withdraw to pain 3-/5. Sensation, coordination not cooperative and gait not tested.    ASSESSMENT/PLAN [name] is a 79 y.o. 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 She did not receive IV t-PA due to anticoagulation with Xarelto. IR - Cerebral Angiogram with Intervention - 05/03/20 - revascularization of occluded sup division M2 branch.  Stroke: Lt MCA scattered infarcts due to left M2 occlusion s/p [id number] - embolic - likely from Afib even on Xarelto.  CT Head - No acute intracranial hemorrhage. No acute infarction identified. ASPECT score is 10.      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 No hemodynamically significant stenosis the neck.   CT Perfusion - No core infarction identified. 67 mL of penumbra in the posterior left MCA territory.  MRI head x 2 -  Patchy acute ischemic infarcts involving the MCA and ACA distributions, predominantly cortical in nature.    MRA head x 2 -  Previously identified occluded left M2 branch appears grossly patent status post revascularization.   EEG - left cortical dysfunction, no seizure  2D Echo - EF 55-60%  [employer] Virus 2 - negative  LDL - 124  HgbA1c - 6.4  VTE prophylaxis - [place] Heparin  Xarelto (rivaroxaban) daily prior to admission, now on ASA 325mg  daily. May consider resume AC 3-5 days post stroke.May consider Eliquis.  Patient counseled to be compliant with her antithrombotic medications  Ongoing aggressive stroke risk factor management  Therapy recommendations:  pending  Disposition:  Pending  Afib RVR  On amiodarone IV drip  On metoprolol PRN  On Xarelto PTA  Now on ASA 325. Will consider to resume AC in 3-5 days post stroke. May consider Eliquis.  rate difficult to control - Cardiology consulted, appreciate help  On amio gtt and po cardizem   Elevated troponin  Trop 68->63->73  Cardiology on board  Considering related to Afib instead of ischemia  Hypertension  Home BP meds: Toprol ; Zestril  Current BP meds: metoprolol prn and deltizem  Blood pressure stable   Long-term BP goal normotensive  Hyperlipidemia  Home Lipid lowering medication: none   LDL 124, goal < 70  Put on Zetia  Hx of statin intolerance (Lipitor ; Pravastatin ; Crestor)  Diabetes  Home diabetic meds: metformin  Current diabetic meds: SSI   HgbA1c 6.4, goal < 7.0  CBG monitoring  PCP follow up  Other Stroke Risk Factors  Advanced age  Obesity, recommend weight loss, diet and exercise as appropriate   Hx stroke/TIA - left thalamic stroke (no details)  Other Active Problems  Code status - Full code   Leukocytosis - WBC's -  13.4->15.6->19.2->16.2->11.1 (afebrile)   Hyponatremia - Na - 129 - repeat in morning and follow  Mild hypotension  Hospital day #  3  Personally examined patient and images, and have participated in and made any corrections needed to history, physical, neuro exam,assessment and plan as stated above.  I have personally obtained the history, evaluated lab date, reviewed imaging studies and agree with radiology interpretations.    Sarina Ill, MD Stroke Neurology  I spent 35 minutes of face-to-face and non-face-to-face time with patient. This included prechart review, lab review, study review, order entry, electronic health record documentation, patient education on the different diagnostic and therapeutic options, counseling and coordination of care, risks and benefits of management, compliance, or risk factor reduction    To contact Stroke Continuity provider, please refer to [email or web address] After hours, contact General Neurology

## 2020-05-07 LAB — CBC
HCT: 29.9 % — ABNORMAL LOW (ref 36.0–46.0)
Hemoglobin: 9.3 g/dL — ABNORMAL LOW (ref 12.0–15.0)
MCH: 23.5 pg — ABNORMAL LOW (ref 26.0–34.0)
MCHC: 31.1 g/dL (ref 30.0–36.0)
MCV: 75.7 fL — ABNORMAL LOW (ref 80.0–100.0)
Platelets: 279 10*3/uL (ref 150–400)
RBC: 3.95 MIL/uL (ref 3.87–5.11)
RDW: 16.5 % — ABNORMAL HIGH (ref 11.5–15.5)
WBC: 8.7 10*3/uL (ref 4.0–10.5)
nRBC: 0.2 % (ref 0.0–0.2)

## 2020-05-07 LAB — GLUCOSE, CAPILLARY
Glucose-Capillary: 107 mg/dL — ABNORMAL HIGH (ref 70–99)
Glucose-Capillary: 110 mg/dL — ABNORMAL HIGH (ref 70–99)
Glucose-Capillary: 122 mg/dL — ABNORMAL HIGH (ref 70–99)
Glucose-Capillary: 124 mg/dL — ABNORMAL HIGH (ref 70–99)
Glucose-Capillary: 135 mg/dL — ABNORMAL HIGH (ref 70–99)
Glucose-Capillary: 146 mg/dL — ABNORMAL HIGH (ref 70–99)

## 2020-05-07 LAB — BASIC METABOLIC PANEL
Anion gap: 10 (ref 5–15)
BUN: 26 mg/dL — ABNORMAL HIGH (ref 8–23)
CO2: 19 mmol/L — ABNORMAL LOW (ref 22–32)
Calcium: 8.3 mg/dL — ABNORMAL LOW (ref 8.9–10.3)
Chloride: 101 mmol/L (ref 98–111)
Creatinine, Ser: 0.94 mg/dL (ref 0.44–1.00)
GFR, Estimated: >60 mL/min (ref >60)
Glucose, Bld: 112 mg/dL — ABNORMAL HIGH (ref 70–99)
Potassium: 3.9 mmol/L (ref 3.5–5.1)
Sodium: 130 mmol/L — ABNORMAL LOW (ref 135–145)

## 2020-05-07 MED ORDER — DILTIAZEM HCL 30 MG PO TABS
60.0000 mg | ORAL_TABLET | Freq: Four times a day (QID) | ORAL | Status: DC
  Administered 2020-05-08 (×2): 60 mg via ORAL
  Filled 2020-05-07 (×2): qty 2

## 2020-05-07 MED ORDER — SODIUM CHLORIDE 0.9 % IV SOLN: INTRAVENOUS | Status: AC

## 2020-05-07 MED ORDER — DILTIAZEM HCL 30 MG PO TABS: 60.0000 mg | ORAL_TABLET | Freq: Four times a day (QID) | ORAL | Status: DC

## 2020-05-07 MED ORDER — DILTIAZEM HCL 30 MG PO TABS
30.0000 mg | ORAL_TABLET | Freq: Once | ORAL | Status: AC
  Administered 2020-05-07: 30 mg via ORAL
  Filled 2020-05-07: qty 1

## 2020-05-07 MED ORDER — DILTIAZEM HCL 30 MG PO TABS: 60.0000 mg | ORAL_TABLET | Freq: Once | ORAL | Status: DC

## 2020-05-07 MED ORDER — INSULIN ASPART 100 UNIT/ML ~~LOC~~ SOLN
0.0000 [IU] | Freq: Three times a day (TID) | SUBCUTANEOUS | Status: DC
  Administered 2020-05-10 (×3): 1 [IU] via SUBCUTANEOUS

## 2020-05-07 MED ORDER — DILTIAZEM HCL 30 MG PO TABS: 90.0000 mg | ORAL_TABLET | Freq: Four times a day (QID) | ORAL | Status: DC

## 2020-05-07 NOTE — Plan of Care (Cosign Needed Addendum)
Paged by RN regarding pt's HR and BP. Apparently the patient was hypotensive last night and had been receiving extra fluids throughout the day today. HR has been gradually drifting down today and is now in the 50's, however, BP is much improved and the pt is currently asymptomatic and resting comfortably. She is scheduled to receive diltiazem 90 mg Q 6 hours and metoprolol 50 mg Bid. I instructed the nurse to give diltiazem only 30 mg at her next scheduled dose at 6 pm today and then give only 60 mg diltiazem Q 6 hrs starting with midnight dose. Hold metoprolol for HR < 60. If further concerns or questions she will contact Cardiology. Cardiology will be asked to re-evaluate cardiac medications Monday AM.  Mikey Bussing PA-C [facility] Pager [phone] 05/07/2020, 4:52 PM

## 2020-05-07 NOTE — Progress Notes (Signed)
   Progress Note  Patient Name: Sheila Ray Date of Encounter: 05/07/2020  Primary Cardiologist: Donato Heinz, MD  Heart rate control in atrial fibrillation is better, now on oral Cardizem 90 mg Q6 hours and Lopressor 50 mg BID. Still on ASA with anticipated transition back to [place] 3-5 days post stroke per Neurology. We will continue to follow.  Signed, Rozann Lesches, MD  05/07/2020, 9:55 AM

## 2020-05-07 NOTE — Progress Notes (Signed)
Inpatient Rehab Admissions Coordinator Note:   Per PT recommendation, pt was screened for CIR candidacy by Gayland Curry, MS, CCC-SLP.  At this time we are not recommending an inpatient rehab consult.  Will follow pt's tolerance and progress with therapies as well as medical workup from a distance. Please contact me with questions.    Gayland Curry, [place], [place] Admissions Coordinator [phone] 05/07/20 12:43 PM

## 2020-05-07 NOTE — Progress Notes (Signed)
STROKE TEAM PROGRESS NOTE   INTERVAL HISTORY  No family at bedside. Moved to floor. She is off the amio drip, oral dilt for her Afib with RVR much improved HR. Cardiology was consulted. Patient states she is ocmpliant with Xarelto, takes it every morning with food.   OBJECTIVE Vitals:   05/07/20 0424 05/07/20 0759 05/07/20 1151 05/07/20 1557  BP: 109/65 (!) 143/81 135/72   Pulse: (!) 57 89 89 (!) 54  Resp: 18 18 18    Temp: (!) 97.4 F (36.3 C) 97.9 F (36.6 C)    TempSrc: Oral     SpO2: 99% 97% 98%   Weight:      Height:        CBC:  Recent Labs  Lab 05/03/20 1254 05/03/20 1259 05/04/20 0317 05/05/20 0051 05/06/20 0452 05/07/20 0445  WBC 13.4*   < > 19.2*   < > 11.1* 8.7  NEUTROABS 10.9*  --  17.5*  --   --   --   HGB 11.5*   < > 10.9*   < > 10.0* 9.3*  HCT 37.6   < > 37.4   < > 32.5* 29.9*  MCV 77.8*   < > 82.4   < > 75.2* 75.7*  PLT 338   < > 407*   < > 335 279   < > = values in this interval not displayed.    Basic Metabolic Panel:  Recent Labs  Lab 05/04/20 0442 05/05/20 0051 05/05/20 0456 05/06/20 0452 05/07/20 0445  NA  --    < >  --  129* 130*  K  --    < >  --  3.5 3.9  CL  --    < >  --  101 101  CO2  --    < >  --  19* 19*  GLUCOSE  --    < >  --  148* 112*  BUN  --    < >  --  17 26*  CREATININE  --    < >  --  0.72 0.94  CALCIUM  --    < >  --  8.4* 8.3*  MG 1.7  --  2.0  --   --    < > = values in this interval not displayed.    Lipid Panel:     Component Value Date/Time   CHOL 173 05/04/2020 0317   CHOL 226 (H) 09/16/2019 1136   CHOL 173 01/06/2013 1354   TRIG 65 05/04/2020 0317   TRIG 120 05/06/2013 1341   TRIG 95 01/06/2013 1354   HDL 36 (L) 05/04/2020 0317   HDL 43 09/16/2019 1136   HDL 47 05/06/2013 1341   HDL 41 01/06/2013 1354   CHOLHDL 4.8 05/04/2020 0317   VLDL 13 05/04/2020 0317   LDLCALC 124 (H) 05/04/2020 0317   LDLCALC 158 (H) 09/16/2019 1136   LDLCALC 109 (H) 05/06/2013 1341   LDLCALC 113 (H) 01/06/2013 1354    HgbA1c:  Lab Results  Component Value Date   HGBA1C 6.4 (H) 05/04/2020   Urine Drug Screen: No results found for: LABOPIA, COCAINSCRNUR, LABBENZ, AMPHETMU, THCU, LABBARB  Alcohol Level     Component Value Date/Time   ETH <10 05/03/2020 1317    IMAGING  CT HEAD CODE STROKE WO CONTRAST CT Angio Head W or Wo Contrast CT Angio Neck W and/or Wo Contrast 05/03/2020   IMPRESSION:   No acute intracranial hemorrhage. No acute infarction identified.   ASPECT score  is 10.   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   No hemodynamically significant stenosis the neck.   Partially imaged mild interstitial pulmonary edema and small bilateral pleural effusions.  MR BRAIN WO CONTRAST 05/03/2020 IMPRESSION:  1. 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 Associated minimal petechial hemorrhage at the left temporal region without significant mass effect.  2. Underlying age-related cerebral atrophy with moderate chronic microvascular ischemic disease.   MR ANGIO HEAD WO CONTRAST 05/03/2020 IMPRESSION:  1. Technically limited exam due to motion artifact.  2. Previously identified occluded left M2 branch appears grossly patent status post revascularization.  3. Otherwise grossly stable and negative intracranial MRA. No other large vessel occlusion. No other hemodynamically significant or correctable stenosis.   CT CEREBRAL PERFUSION W CONTRAST 05/03/2020 IMPRESSION:  No core infarction identified. 67 mL of penumbra in the posterior left MCA territory.  DG CHEST PORT 1 VIEW 05/03/2020 IMPRESSION:  Vascular congestion with mild parenchymal edema on the right consistent with congestive failure.  Neuro Interventional Radiology - Cerebral Angiogram with Intervention - Dr Estanislado Pandy 05/03/2020 4:55 PM RT common carotid arteriogram followed by revascularization of occluded sup division M2 branch  with x 2 passes with Contact aspiration using Zoom 35 and 55 aspiration catheters and partially of  the distal M3 region of inf division with mechanical thrombolysis achieving a TICI 2C revascularization. Recanalizing Rt ACA A2/A3 junction filling defect noted .  Transthoracic Echocardiogram  1. Left ventricular ejection fraction, by estimation, is 55 to 60%. The left ventricle has normal function. The left ventricle has no regional wall motion abnormalities. There is moderate concentric left ventricular hypertrophy. Left ventricular diastolic function could not be evaluated.  2. Right ventricular systolic function is mildly reduced. The right ventricular size is moderately enlarged. There is moderately elevated pulmonary artery systolic pressure. The estimated right ventricular systolic pressure is 32.2 mmHg.  3. Left atrial size was moderately dilated.  4. Right atrial size was severely dilated.  5. A small pericardial effusion is present. The pericardial effusion is posterior to the left ventricle. Large pleural effusion in the left lateral region.  6. The mitral valve is normal in structure. Moderate mitral valve regurgitation. No evidence of mitral stenosis.  7. Tricuspid valve regurgitation is moderate to severe.  8. The aortic valve is normal in structure. Aortic valve regurgitation is trivial. No aortic stenosis is present.  9. The inferior vena cava is normal in size with greater than 50% respiratory variability, suggesting right atrial pressure of 3 mmHg.   ECG - atrial fibrillation - ventricular response 131 BPM (See cardiology reading for complete details)  EEG - This study is suggestive of cortical dysfunction in left hemisphere, maximal left temporal region consistent with underlying stroke.  No seizures or definite epileptiform discharges were seen throughout the recording.   PHYSICAL EXAM   Temp:  [97.4 F (36.3 C)-97.9 F (36.6 C)] 97.9 F (36.6 C) (11/07  0759) Pulse Rate:  [54-89] 54 (11/07 1557) Resp:  [18] 18 (11/07 1151) BP: (103-143)/(52-81) 135/72 (11/07 1151) SpO2:  [95 %-99 %] 98 % (11/07 1151) Weight:  [79.4 kg] 79.4 kg (11/06 1732)  General - Well nourished, well developed, in no apparent distress, lethargic.  Ophthalmologic - fundi not visualized due to noncooperation.  Neuro - awake, alert,aphasia but no dysarthria, orineted to name, month, not year, able to follow most simpleby verbal command  on the left, wiggles right toes  and can grip on the right today and move the hand, can repeat 3 words, cannot name when holding up thumb. Left gaze preference, but able to gaze bilaterally and tracks me across the room. Inconsistently blinking to visual threat right but consistent on the left . PERRL, no significant facial droop. Tongue protrusion can stick tongue out with verbal command is midline. LUE strong and active, no drift. LLE brisk movement 3/5 on pain. RUE 1/5 wit better movement in the right hand today. RLE mild withdraw to pain and can wiggle toes, Sensation, coordination not cooperative and gait not tested.    ASSESSMENT/PLAN Sheila Ray is a 79 y.o. 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 She did not receive IV t-PA due to anticoagulation with Xarelto. IR - Cerebral Angiogram with Intervention - 05/03/20 - revascularization of occluded sup division M2 branch.  Stroke: Lt MCA scattered infarcts due to left M2 occlusion s/p GEXB2W - embolic - likely from Afib even on Xarelto.  CT Head - No acute intracranial hemorrhage. No acute infarction identified. ASPECT score is 10.      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 No hemodynamically significant  stenosis the neck.   CT Perfusion - No core infarction identified. 67 mL of penumbra in the posterior left MCA territory.  MRI head x 2 - Patchy acute ischemic infarcts involving the MCA and ACA distributions, predominantly cortical in nature.    MRA head x 2 -  Previously identified occluded left M2 branch appears grossly patent status post revascularization.   EEG - left cortical dysfunction, no seizure  2D Echo - EF 55-60%  [employer] Virus 2 - negative  LDL - 124  HgbA1c - 6.4  VTE prophylaxis -  Heparin  Xarelto (rivaroxaban) daily prior to admission, now on ASA 325mg  daily. May consider resume AC 5 days post stroke.May consider Eliquis Monday(was on Xarelto)  Patient counseled to be compliant with her antithrombotic medications  Ongoing aggressive stroke risk factor management  Therapy recommendations:  CIR  Disposition:  Pending  Afib RVR  On amiodarone IV drip  On metoprolol PRN  On Xarelto PTA  Now on ASA 325. Will consider to resume AC in 5 days post stroke. May consider Eliquis on Monday (that will be 5 days post stroke) as she was on Xarelto.  rate difficult to control - Cardiology consulted, appreciate help  On amio gtt and po cardizem -> d/c'd  Now on metoprolol and Cardizem - rate control improved. (off amiodarone)  Elevated troponin  Trop 68->63->73  Cardiology on board  Considering related to Afib instead of ischemia  Hypertension  Home BP meds: Toprol ; Zestril  Current BP meds: metoprolol prn and deltizem  Blood pressure stable  . Long-term BP goal normotensive  Hyperlipidemia  Home Lipid lowering medication: none   LDL 124, goal < 70  Put on Zetia  Hx of statin intolerance (Lipitor ; Pravastatin ; Crestor)  Diabetes  Home diabetic meds: metformin  Current diabetic meds: SSI   HgbA1c 6.4, goal < 7.0  CBG monitoring  PCP follow up  Other Stroke Risk Factors  Advanced age  Obesity, recommend weight loss, diet  and exercise as appropriate   Hx stroke/TIA - left thalamic stroke (no details)  Other Active  Problems  Code status - Full code   Leukocytosis - WBC's -  13.4->15.6->19.2->16.2->11.1->8.7 (afebrile)   Hyponatremia - Na - 129 ->130 continue to follow  Mild hypotension  Hospital day # 4  Personally examined patient and images, and have participated in and made any corrections needed to history, physical, neuro exam,assessment and plan as stated above.  I have personally obtained the history, evaluated lab date, reviewed imaging studies and agree with radiology interpretations.    Sarina Ill, MD Stroke Neurology  I spent 25 minutes of face-to-face and non-face-to-face time with patient. This included prechart review, lab review, study review, order entry, electronic health record documentation, patient education on the different diagnostic and therapeutic options, counseling and coordination of care, risks and benefits of management, compliance, or risk factor reduction    To contact Stroke Continuity provider, please refer to [email or web address] After hours, contact General Neurology

## 2020-05-08 ENCOUNTER — Encounter (HOSPITAL_COMMUNITY): Payer: Self-pay | Admitting: Radiology

## 2020-05-08 DIAGNOSIS — I4821 Permanent atrial fibrillation: Secondary | ICD-10-CM | POA: Diagnosis not present

## 2020-05-08 LAB — CBC
HCT: 34.6 % — ABNORMAL LOW (ref 36.0–46.0)
Hemoglobin: 10.5 g/dL — ABNORMAL LOW (ref 12.0–15.0)
MCH: 23.1 pg — ABNORMAL LOW (ref 26.0–34.0)
MCHC: 30.3 g/dL (ref 30.0–36.0)
MCV: 76.2 fL — ABNORMAL LOW (ref 80.0–100.0)
Platelets: 405 10*3/uL — ABNORMAL HIGH (ref 150–400)
RBC: 4.54 MIL/uL (ref 3.87–5.11)
RDW: 16.6 % — ABNORMAL HIGH (ref 11.5–15.5)
WBC: 13.1 10*3/uL — ABNORMAL HIGH (ref 4.0–10.5)
nRBC: 0 % (ref 0.0–0.2)

## 2020-05-08 LAB — GLUCOSE, CAPILLARY
Glucose-Capillary: 115 mg/dL — ABNORMAL HIGH (ref 70–99)
Glucose-Capillary: 109 mg/dL — ABNORMAL HIGH (ref 70–99)
Glucose-Capillary: 129 mg/dL — ABNORMAL HIGH (ref 70–99)
Glucose-Capillary: 123 mg/dL — ABNORMAL HIGH (ref 70–99)
Glucose-Capillary: 138 mg/dL — ABNORMAL HIGH (ref 70–99)

## 2020-05-08 LAB — BASIC METABOLIC PANEL
Anion gap: 9 (ref 5–15)
BUN: 20 mg/dL (ref 8–23)
CO2: 19 mmol/L — ABNORMAL LOW (ref 22–32)
Calcium: 8.7 mg/dL — ABNORMAL LOW (ref 8.9–10.3)
Chloride: 104 mmol/L (ref 98–111)
Creatinine, Ser: 0.82 mg/dL (ref 0.44–1.00)
GFR, Estimated: >60 mL/min (ref >60)
Glucose, Bld: 123 mg/dL — ABNORMAL HIGH (ref 70–99)
Potassium: 3.6 mmol/L (ref 3.5–5.1)
Sodium: 132 mmol/L — ABNORMAL LOW (ref 135–145)

## 2020-05-08 MED ORDER — DILTIAZEM HCL 30 MG PO TABS
60.0000 mg | ORAL_TABLET | Freq: Three times a day (TID) | ORAL | Status: DC
  Administered 2020-05-08 – 2020-05-15 (×20): 60 mg via ORAL
  Filled 2020-05-08 (×20): qty 2

## 2020-05-08 MED ORDER — APIXABAN 5 MG PO TABS
5.0000 mg | ORAL_TABLET | Freq: Two times a day (BID) | ORAL | Status: DC
  Administered 2020-05-08 – 2020-05-15 (×14): 5 mg via ORAL
  Filled 2020-05-08 (×14): qty 1

## 2020-05-08 NOTE — Progress Notes (Signed)
   05/08/20 2220  Provider Notification  Provider Name/Title Dr Lorrin Goodell  Date Provider Notified 05/08/20  Time Provider Notified 2220  Notification Type Page  Notification Reason Other (Comment) (CCMD called that pt had 2.05sec pause)  Response See new orders  Date of Provider Response 05/08/20  Time of Provider Response 2228

## 2020-05-08 NOTE — Progress Notes (Signed)
Inpatient Rehab Admissions Coordinator:  Pt rescreened for CIR candidacy.  At this time we are recommending an inpatient rehab consult.  AC will contact attending to request consult order.   Gayland Curry, [place], [place] Admissions Coordinator [phone]

## 2020-05-08 NOTE — Progress Notes (Signed)
Brief Update:  I was notified of a brief 2.05 sec pause on telemetry by RN. Patient is resting with no complaints. Hr in 70-72 beats per minute. She is on Diltiazem and Metoprolol. Received her PM doses about 15 mins ago.  - Will get an EKG right now. - Cardiology is on board, can discuss with them in AM.  [place] Pager Number [phone]

## 2020-05-08 NOTE — Progress Notes (Signed)
ANTICOAGULATION CONSULT NOTE - Initial Consult  Pharmacy Consult for Apixaban  Indication: atrial fibrillation  Allergies  Allergen Reactions  . Crestor [Rosuvastatin] Other (See Comments)    Cramps  . Lipitor [Atorvastatin] Other (See Comments)    Cramps  . Pravastatin Other (See Comments)    Cramps   . Keflex [Cephalexin] Hives    Patient Measurements: Height: 5' 4.5" (163.8 cm) Weight: 79.4 kg (175 lb 0.7 oz) IBW/kg (Calculated) : 55.85  Vital Signs: Temp: 97.6 F (36.4 C) (11/08 1208) Temp Source: Oral (11/08 1208) BP: 117/63 (11/08 1208) Pulse Rate: 72 (11/08 1208)  Labs: Recent Labs    05/06/20 0452 05/06/20 0452 05/07/20 0445 05/08/20 0110  HGB 10.0*   < > 9.3* 10.5*  HCT 32.5*  --  29.9* 34.6*  PLT 335  --  279 405*  CREATININE 0.72  --  0.94 0.82   < > = values in this interval not displayed.    Estimated Creatinine Clearance: 57.3 mL/min (by C-G formula based on SCr of 0.82 mg/dL).   Medical History: Past Medical History:  Diagnosis Date  . Arthritis   . Atrial fibrillation ([place])   . Complication of anesthesia   . Diabetes mellitus without complication ([place])   . Diverticulosis   . Fatigue   . Gastric polyp   . Goiter   . Hyperlipidemia   . Hypertension   . Hypothyroid    taken off of thyroid medication 2012014   . Obese   . Osteopenia   . PONV (postoperative nausea and vomiting)   . Postmenopausal   . Rosacea   . Stroke ([place]) 01/15/2013   left thalamic  stroke, small vessel  . Vitamin D deficiency     Assessment: 79 yr old female with hx of atrial fibrillation (on rivaroxaban, last dose taken 05/02/20 AM), DM, HLD, HTN, hypothyroidism, prior L thalamic stroke without residual deficits, admitted 03/08/10 with embolic stroke, despite rivaroxaban therapy PTA. Pharmacy is consulted to dose apixaban for non-valvular atrial fibrillation. Pt has been receiving heparin 5000 units SQ Q 8 hrs (last dose at 1500 today, 11/8).  H/H 10.5/34.6,  platelets 405; Scr 0.82; 79 yrs old; 79.4 kg  Goal of Therapy:  Prevention of stroke secondary to atrial fibrillation Monitor platelets by anticoagulation protocol: Yes   Plan:  Start apixaban 5 mg PO BID at 2300 tonight (~8 hrs after last dose of SQ heparin) Monitor CBC Monitor for signs/symptoms of bleeding  Gillermina Hu, PharmD, BCPS, [facility] Clinical Pharmacist 05/08/2020,3:24 PM

## 2020-05-08 NOTE — Progress Notes (Signed)
Progress Note  Patient Name: Sheila Ray Date of Encounter: 05/08/2020  Primary Cardiologist:   Donato Heinz, MD   Subjective   She is unable to verbalize complaints.  She seemed to indicate a headache.    Inpatient Medications    Scheduled Meds: .  stroke: mapping our early stages of recovery book   Does not apply Once  . aspirin EC  325 mg Oral Daily  . diltiazem  60 mg Oral Q6H  . ezetimibe  10 mg Oral Daily  . heparin  5,000 Units Subcutaneous Q8H  . insulin aspart  0-6 Units Subcutaneous TID AC & HS  . ipratropium  1 spray Each Nare BID  . mouth rinse  15 mL Mouth Rinse BID  . metoprolol tartrate  50 mg Oral BID  . nystatin   Topical BID   Continuous Infusions: . sodium chloride Stopped (05/03/20 2315)   PRN Meds: acetaminophen **OR** acetaminophen (TYLENOL) oral liquid 160 mg/5 mL **OR** acetaminophen, metoprolol tartrate, prochlorperazine, senna-docusate   Vital Signs    Vitals:   05/07/20 2023 05/07/20 2354 05/08/20 0350 05/08/20 0857  BP: 134/71 (!) 152/89 (!) 146/70 128/66  Pulse: 64 90 65 84  Resp: 18 18 18 18   Temp: (!) 97.5 F (36.4 C) 97.9 F (36.6 C) 98.2 F (36.8 C) 98.4 F (36.9 C)  TempSrc: Oral Oral Oral Oral  SpO2: 97% 98% 96% 99%  Weight:      Height:        Intake/Output Summary (Last 24 hours) at 05/08/2020 0919 Last data filed at 05/08/2020 0650 Gross per 24 hour  Intake 2143.23 ml  Output 1750 ml  Net 393.23 ml   Filed Weights   05/06/20 1732  Weight: 79.4 kg    Telemetry    Atrial fib with slow ventricular rate at times - Personally Reviewed  ECG    NA - Personally Reviewed  Physical Exam   GEN: No acute distress.   Neck: No  JVD Cardiac: Irregular RR, no murmurs, rubs, or gallops.  Respiratory: Clear  to auscultation bilaterally. GI: Soft, nontender, non-distended  MS: No  edema; No deformity.   Labs    Chemistry Recent Labs  Lab 05/03/20 1317 05/03/20 1320 05/06/20 0452 05/07/20 0445  05/08/20 0110  NA 131*   < > 129* 130* 132*  K 3.7   < > 3.5 3.9 3.6  CL 102   < > 101 101 104  CO2 22   < > 19* 19* 19*  GLUCOSE 132*   < > 148* 112* 123*  BUN 11   < > 17 26* 20  CREATININE 0.64   < > 0.72 0.94 0.82  CALCIUM 8.6*   < > 8.4* 8.3* 8.7*  PROT 5.7*  --   --   --   --   ALBUMIN 3.0*  --   --   --   --   AST 15  --   --   --   --   ALT 12  --   --   --   --   ALKPHOS 52  --   --   --   --   BILITOT 0.5  --   --   --   --   GFRNONAA >60   < > >60 >60 >60  ANIONGAP 7   < > 9 10 9    < > = values in this interval not displayed.     Hematology Recent Labs  Lab 05/06/20 0452 05/07/20 0445 05/08/20 0110  WBC 11.1* 8.7 13.1*  RBC 4.32 3.95 4.54  HGB 10.0* 9.3* 10.5*  HCT 32.5* 29.9* 34.6*  MCV 75.2* 75.7* 76.2*  MCH 23.1* 23.5* 23.1*  MCHC 30.8 31.1 30.3  RDW 16.7* 16.5* 16.6*  PLT 335 279 405*    Cardiac EnzymesNo results for input(s): TROPONINI in the last 168 hours. No results for input(s): TROPIPOC in the last 168 hours.   BNPNo results for input(s): BNP, PROBNP in the last 168 hours.   DDimer No results for input(s): DDIMER in the last 168 hours.   Radiology    VAS [place] UPPER EXTREMITY VENOUS DUPLEX  Result Date: 05/06/2020 UPPER VENOUS STUDY  Indications: Edema, and IV infiltration Limitations: Edema. Comparison Study: No prior study Performing Technologist: Sharion Dove RVS  Examination Guidelines: A complete evaluation includes B-mode imaging, spectral Doppler, color Doppler, and power Doppler as needed of all accessible portions of each vessel. Bilateral testing is considered an integral part of a complete examination. Limited examinations for reoccurring indications may be performed as noted.  Right Findings: +----------+------------+---------+-----------+----------+-------+ RIGHT     CompressiblePhasicitySpontaneousPropertiesSummary +----------+------------+---------+-----------+----------+-------+ IJV           Full                                           +----------+------------+---------+-----------+----------+-------+ Subclavian    Full       Yes       Yes                      +----------+------------+---------+-----------+----------+-------+ Axillary      Full       Yes       Yes                      +----------+------------+---------+-----------+----------+-------+ Brachial      Full       Yes       Yes                      +----------+------------+---------+-----------+----------+-------+ Cephalic      None                                   Acute  +----------+------------+---------+-----------+----------+-------+ Basilic       Full                                          +----------+------------+---------+-----------+----------+-------+  Left Findings: +----------+------------+---------+-----------+----------+-------+ LEFT      CompressiblePhasicitySpontaneousPropertiesSummary +----------+------------+---------+-----------+----------+-------+ Subclavian    Full       Yes       Yes                      +----------+------------+---------+-----------+----------+-------+  Summary:  Right: No evidence of deep vein thrombosis in the upper extremity. Findings consistent with acute superficial vein thrombosis involving the right cephalic vein.  Left: No evidence of thrombosis in the subclavian.  *See table(s) above for measurements and observations.  Diagnosing physician: Servando Snare MD Electronically signed by Servando Snare MD on 05/06/2020 at 3:30:01 PM.    Final     Cardiac Studies   ECHO:  Transthoracic  Echocardiogram  1. Left ventricular ejection fraction, by estimation, is 55 to 60%. The left ventricle has normal function. The left ventricle has no regional wall motion abnormalities. There is moderate concentric left ventricular hypertrophy. Left ventricular diastolic function could not be evaluated.  2. Right ventricular systolic function is mildly reduced. The right ventricular size  is moderately enlarged. There is moderately elevated pulmonary artery systolic pressure. The estimated right ventricular systolic pressure is 10.3 mmHg.  3. Left atrial size was moderately dilated.  4. Right atrial size was severely dilated.  5. A small pericardial effusion is present. The pericardial effusion is posterior to the left ventricle. Large pleural effusion in the left lateral region.  6. The mitral valve is normal in structure. Moderate mitral valve regurgitation. No evidence of mitral stenosis.  7. Tricuspid valve regurgitation is moderate to severe.  8. The aortic valve is normal in structure. Aortic valve regurgitation is trivial. No aortic stenosis is present.  9. The inferior vena cava is normal in size with greater than 50% respiratory variability, suggesting right atrial pressure of 3 mmHg.   Patient Profile     79 y.o. female hx of atrial fibrillation (Xerelto), HLD, HTN, CVA (2014), hypothyroidism, DM,  who is being seen for the evaluation of atrial fibrillation.    Assessment & Plan    ATRIAL FIB WITH RVR:  Rate control and anticoagulation.  The timing of resuming DOAC is up to the neurologist.   I would agree with switching to Eliquis since stroke occurred on Xarelto.   She has had low heart rates.  Cardizem reduced.  I will changed to TID Cardizem and continue current beta blocker. We can consolidate to CD and XL before discharge.    For questions or updates, please contact [place] Please consult [email or web address] for contact info under Cardiology/STEMI.   Signed, Minus Breeding, MD  05/08/2020, 9:19 AM

## 2020-05-08 NOTE — Evaluation (Signed)
Occupational Therapy Evaluation Patient Details Name: Sheila Ray MRN: [id number] DOB: 1940-08-26 Today's Date: 05/08/2020    History of Present Illness The pt is a 79 yo female presenting with R-sided facial droop, mild dysarthria, confusion and rapid A fib. 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  Imaging showing patchy acute ischemic infarcts involving the L MCA and ACA distributions.   Clinical Impression   PTA patient independent and driving. Admitted for above and limited by problem list below, including impaired communication and cognition, R sided weakness and decreased coordination, impaired balance, and decreased activity tolerance.  She requires total assist +2 for bed mobility and sit to stand transfers, max-total assist +2 for ADLs at this time.  She follows 1 step commands inconsistently given increased time and multimodal cueing, poor awareness to situation reporting "I think I have the covid". She requires cueing to maintain eyes open during session, limited visual assessment but able to scan to L and R.  Reports throat pain during session, RN present and aware. Believe she will benefit from continued OT services while admitted and after dc at CIR level to optimize independence and return to PLOF with ADLs, mobility.     Follow Up Recommendations  CIR;Supervision/Assistance - 24 hour    Equipment Recommendations  Other (comment) (TBD at next venue of care )    Recommendations for Other Services       Precautions / Restrictions Precautions Precautions: Fall Restrictions Weight Bearing Restrictions: No      Mobility Bed Mobility Overal bed mobility: Needs Assistance Bed Mobility: Supine to Sit;Sit to Sidelying;Rolling Rolling: Max assist;+2 for physical assistance;+2 for safety/equipment   Supine to sit: Total assist;+2 for physical assistance   Sit to sidelying: Total assist;+2 for physical assistance;+2 for  safety/equipment General bed mobility comments: pt able to assist minimally after initation with L LE towards EOB, overall requires total assist +2; rolling in bed to adjust bed pads with max assist +2     Transfers                 General transfer comment: unable d/t fatigue and safety     Balance Overall balance assessment: Needs assistance Sitting-balance support: Feet supported;Bilateral upper extremity supported Sitting balance-Leahy Scale: Poor Sitting balance - Comments: relies on BUE support, posterior R lateral lean with UE support removed  Postural control: Posterior lean;Right lateral lean   Standing balance-Leahy Scale: Zero                             ADL either performed or assessed with clinical judgement   ADL Overall ADL's : Needs assistance/impaired     Grooming: Maximal assistance;Sitting Grooming Details (indicate cue type and reason): hand over hand L UE to comb hair, unable to maintain task after initation without support Upper Body Bathing: Maximal assistance;Sitting   Lower Body Bathing: Total assistance;+2 for physical assistance;+2 for safety/equipment;Sit to/from stand   Upper Body Dressing : Total assistance;Sitting   Lower Body Dressing: Total assistance;+2 for physical assistance;+2 for safety/equipment;Sit to/from Health and safety inspector Details (indicate cue type and reason): deferred          Functional mobility during ADLs: +2 for physical assistance;Total assistance;+2 for safety/equipment General ADL Comments: pt limited by impaired cognition, anxiety, weakness, impaired commuincation      Vision   Additional Comments: requires cueing to maintain eyes open, scans to L  and R with increased time; limited assessment and further required     Perception     Praxis      Pertinent Vitals/Pain Pain Assessment: Faces Faces Pain Scale: Hurts little more Pain Location: throat Pain Descriptors / Indicators:  Discomfort;Grimacing Pain Intervention(s): Limited activity within patient's tolerance;Monitored during session;Repositioned;Other (comment) (RN present for pain meds at end of session)     Hand Dominance Right   Extremity/Trunk Assessment Upper Extremity Assessment Upper Extremity Assessment: RUE deficits/detail;Difficult to assess due to impaired cognition RUE Deficits / Details: able to squeeze hand on command, unable to lift arm; PROM WFL  RUE Coordination: decreased fine motor;decreased gross motor   Lower Extremity Assessment Lower Extremity Assessment: Defer to PT evaluation       Communication Communication Communication: Receptive difficulties;Expressive difficulties   Cognition Arousal/Alertness: Lethargic Behavior During Therapy: Flat affect;Anxious Overall Cognitive Status: Difficult to assess Area of Impairment: Awareness;Problem solving;Following commands                       Following Commands: Follows one step commands inconsistently;Follows one step commands with increased time   Awareness: Intellectual Problem Solving: Slow processing;Decreased initiation;Difficulty sequencing;Requires verbal cues;Requires tactile cues General Comments: patient with slow processing, requires increased time to follow simple 1 step commands and is very inconsistent   General Comments  VSS- HR 80-95    Exercises     Shoulder Instructions      Home Living Family/patient expects to be discharged to:: Private residence Living Arrangements: Alone   Type of Home: House Home Access: Stairs to enter Technical brewer of Steps: 3 Entrance Stairs-Rails: Right;Left Home Layout: One level     Bathroom Shower/Tub: Occupational psychologist: Standard     Home Equipment: None   Additional Comments: per chart review      Prior Functioning/Environment Level of Independence: Independent        Comments: drove, I in ADL's and iADL's        OT  Problem List: Decreased strength;Decreased activity tolerance;Decreased range of motion;Impaired balance (sitting and/or standing);Impaired vision/perception;Decreased coordination;Decreased cognition;Decreased safety awareness;Decreased knowledge of use of DME or AE;Decreased knowledge of precautions;Obesity;Impaired UE functional use;Pain      OT Treatment/Interventions: Self-care/ADL training;DME and/or AE instruction;Therapeutic activities;Cognitive remediation/compensation;Patient/family education;Balance training;Visual/perceptual remediation/compensation;Therapeutic exercise;Neuromuscular education    OT Goals(Current goals can be found in the care plan section) Acute Rehab OT Goals Time For Goal Achievement: 05/22/20 Potential to Achieve Goals: Good  OT Frequency: Min 2X/week   Barriers to D/C:            Co-evaluation PT/OT/SLP Co-Evaluation/Treatment: Yes Reason for Co-Treatment: For patient/therapist safety;To address functional/ADL transfers;Necessary to address cognition/behavior during functional activity   OT goals addressed during session: ADL's and self-care;Strengthening/ROM      AM-PAC OT "6 Clicks" Daily Activity     Outcome Measure Help from another person eating meals?: A Lot Help from another person taking care of personal grooming?: A Lot Help from another person toileting, which includes using toliet, bedpan, or urinal?: Total Help from another person bathing (including washing, rinsing, drying)?: A Lot Help from another person to put on and taking off regular upper body clothing?: Total Help from another person to put on and taking off regular lower body clothing?: Total 6 Click Score: 9   End of Session Equipment Utilized During Treatment: Gait belt;Oxygen (2L) Nurse Communication: Mobility status;Precautions  Activity Tolerance: Patient limited by pain;Patient limited by fatigue Patient left: in bed;with call  bell/phone within reach;with bed alarm  set;with nursing/sitter in room  OT Visit Diagnosis: Other abnormalities of gait and mobility (R26.89);Muscle weakness (generalized) (M62.81);Pain;Cognitive communication deficit (R41.841);Other symptoms and signs involving cognitive function Symptoms and signs involving cognitive functions: Cerebral infarction Pain - part of body:  (throat)                Time: 9470-9628 OT Time Calculation (min): 18 min Charges:  OT General Charges $OT Visit: 1 Visit OT Evaluation $OT Eval Moderate Complexity: 1 Mod  Jolaine Artist, OT Acute Rehabilitation Services Pager [phone] Office [phone]   Delight Stare 05/08/2020, 9:46 AM

## 2020-05-08 NOTE — Progress Notes (Signed)
Speech Language Pathology Treatment: Dysphagia;Cognitive-Linquistic (Aphasia)  Patient Details Name: Sheila Ray MRN: [id number] DOB: Aug 31, 1940 Today's Date: 05/08/2020 Time: 4098-1191 SLP Time Calculation (min) (ACUTE ONLY): 31 min  Assessment / Plan / Recommendation Clinical Impression  Pt was seen for treatment and was cooperative during the session. Nursing reported that the pt has been tolerating the current diet well but stated that the pt is "confused" and expressed that the pt stated that she was at [facility] instead of [facility]. Considering the pt's impairments in auditory comprehension and verbal expression, the potential for this error being attributable to aphasia and not cognition was discussed with the RN. Pt has demonstrated further improvement in language skills compared to when she was last seen by this SLP. One of the longer utterances produced during this session was "First of all, I want to tell you I don't have all of my glasses so it's going to be hard." Paraphasias continue to be noted and pt was inconsistently able to self-correct. She required verbal prompts during a picture-description task. She achieved 60% accuracy with confrontational naming increasing to 90% with verbal prompts and phonemic cues. She completed a responsive naming task with 80% accuracy increasing to 100% with verbal prompts. She demonstrated 60% accuracy with moderately complex yes/no questions increasing to 90% with rephrasing and visual cues. Pt participated in a simple conversation regarding her preferences with regards to pizza toppings and hamburgers. Repetition was intermittently needed for auditory comprehension of questions and she was able to retrieve toppings with semantic cues and occasional phonemic cues. Pt tolerated solids without symptoms of oropharyngeal dysphagia but inconsistently exhibited throat clearing with 2/4 boluses of thin liquids via straw which she attributed to  "phlegm". It is recommended that the pt's current diet be continued, but an instrumental assessment may be warranted is she remains symptomatic. SLP will continue to follow pt.    HPI HPI: Pt is a 79 y.o. female who has a PMH including but not limited to A.fib on xarelto, HTN, HLD, DM, hypothyroidism, prior left thalamic stroke with residual deficits.  She presented to the ED 11/3 with confusion, aphasia, right facial droop, right leg weakness.  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  She was taken to IR and had right common carotid arteriogram followed by revascularization of occluded superior division M2 branch and partial revascularization of the distal M3 branch with mechanical thrombolysis.  Post CT brain showed no ICH or mass effect. She was intubated 11/3 for procedure. EEg: cortical dysfunction in left hemisphere, maximal left temporal region consistent with underlying stroke. No seizures.       SLP Plan  Continue with current plan of care       Recommendations  Diet recommendations: Regular;Thin liquid Liquids provided via: Cup;Straw Medication Administration: Whole meds with puree Supervision: Staff to assist with self feeding Compensations: Minimize environmental distractions Postural Changes and/or Swallow Maneuvers: Seated upright 90 degrees                Oral Care Recommendations: Oral care BID Follow up Recommendations: Inpatient Rehab SLP Visit Diagnosis: Aphasia (R47.01);Dysphagia, unspecified (R13.10) Plan: Continue with current plan of care       Sheila Kiel I. Vear Clock, MS, CCC-SLP Acute Rehabilitation Services Office number [phone] Pager [phone]                Scheryl Marten 05/08/2020, 5:14 PM

## 2020-05-08 NOTE — Progress Notes (Signed)
Physical Therapy Treatment Patient Details Name: Sheila Ray MRN: [id number] DOB: [birth year] Today's Date: 05/08/2020    History of Present Illness The pt is a 79 yo female presenting with R-sided facial droop, mild dysarthria, confusion and rapid A fib. 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  Imaging showing patchy acute ischemic infarcts involving the L MCA and ACA distributions.    PT Comments    Pt received in bed, anxious and easily distracted. Disoriented to time, place and situation. Upon entering room, pt very emotional stating "I have an appointment." Pt comforted and reoriented. She remained easily distracted and labile during session. She required +2 total assist bed mobility and +2 total/HHA for standing attempts. She demonstrates poor sitting balance, requiring BUE support as well as external support to maintain sitting EOB. Pt supine in bed at end of session with RN present in room.    Follow Up Recommendations  CIR     Equipment Recommendations  Other (comment) (TBA)    Recommendations for Other Services       Precautions / Restrictions Precautions Precautions: Fall Restrictions Weight Bearing Restrictions: No    Mobility  Bed Mobility Overal bed mobility: Needs Assistance Bed Mobility: Supine to Sit;Sit to Sidelying;Rolling Rolling: Max assist;+2 for physical assistance   Supine to sit: Total assist;+2 for physical assistance Sit to supine: Total assist;+2 for physical assistance Sit to sidelying: Total assist;+2 for physical assistance;+2 for safety/equipment General bed mobility comments: assist with all aspects of mobility  Transfers Overall transfer level: Needs assistance Equipment used: 2 person hand held assist Transfers: Sit to/from Stand Sit to Stand: +2 physical assistance;Total assist         General transfer comment: unable to attain full upright stance, clearing bottom from bed but bracing BLE  on bed. Attempted x 2.  Ambulation/Gait             General Gait Details: unable   Stairs             Wheelchair Mobility    Modified Rankin (Stroke Patients Only) Modified Rankin (Stroke Patients Only) Pre-Morbid Rankin Score: No symptoms Modified Rankin: Severe disability     Balance Overall balance assessment: Needs assistance Sitting-balance support: Feet supported;Bilateral upper extremity supported Sitting balance-Leahy Scale: Poor Sitting balance - Comments: relies on BUE support, posterior R lateral lean with UE support removed  Postural control: Posterior lean;Right lateral lean   Standing balance-Leahy Scale: Zero                              Cognition Arousal/Alertness: Lethargic Behavior During Therapy: Flat affect;Anxious Overall Cognitive Status: Difficult to assess Area of Impairment: Awareness;Problem solving;Following commands                       Following Commands: Follows one step commands inconsistently;Follows one step commands with increased time   Awareness: Intellectual Problem Solving: Slow processing;Decreased initiation;Difficulty sequencing;Requires verbal cues;Requires tactile cues General Comments: patient with slow processing, requires increased time to follow simple 1 step commands and is very inconsistent      Exercises      General Comments General comments (skin integrity, edema, etc.): VSS. HR 78-95      Pertinent Vitals/Pain Pain Assessment: Faces Faces Pain Scale: Hurts little more Pain Location: throat, headache Pain Descriptors / Indicators: Discomfort;Grimacing;Headache Pain Intervention(s): Limited activity within patient's tolerance;Monitored during session;Repositioned;RN gave pain meds  during session    [place] expects to be discharged to:: Private residence Living Arrangements: Alone   Type of Home: House Home Access: Stairs to enter Entrance Stairs-Rails:  Right;Left Home Layout: One level Home Equipment: None Additional Comments: per chart review    Prior Function Level of Independence: Independent      Comments: drove, I in ADL's and iADL's   PT Goals (current goals can now be found in the care plan section) Acute Rehab PT Goals Patient Stated Goal: "I want to go home." Progress towards PT goals: Progressing toward goals    Frequency    Min 3X/week      PT Plan Current plan remains appropriate    Co-evaluation PT/OT/SLP Co-Evaluation/Treatment: Yes Reason for Co-Treatment: For patient/therapist safety;Necessary to address cognition/behavior during functional activity;To address functional/ADL transfers PT goals addressed during session: Mobility/safety with mobility;Balance OT goals addressed during session: ADL's and self-care;Strengthening/ROM      AM-PAC PT "6 Clicks" Mobility   Outcome Measure  Help needed turning from your back to your side while in a flat bed without using bedrails?: A Lot Help needed moving from lying on your back to sitting on the side of a flat bed without using bedrails?: Total Help needed moving to and from a bed to a chair (including a wheelchair)?: Total Help needed standing up from a chair using your arms (e.g., wheelchair or bedside chair)?: Total Help needed to walk in hospital room?: Total Help needed climbing 3-5 steps with a railing? : Total 6 Click Score: 7    End of Session Equipment Utilized During Treatment: Gait belt Activity Tolerance: Patient tolerated treatment well Patient left: in bed;with call bell/phone within reach;with bed alarm set;with nursing/sitter in room Nurse Communication: Mobility status PT Visit Diagnosis: Hemiplegia and hemiparesis;Muscle weakness (generalized) (M62.81);Other abnormalities of gait and mobility (R26.89) Hemiplegia - Right/Left: Right Hemiplegia - dominant/non-dominant: Dominant Hemiplegia - caused by: Cerebral infarction     Time:  0383-3383 PT Time Calculation (min) (ACUTE ONLY): 24 min  Charges:  $Therapeutic Activity: 8-22 mins                     Lorrin Goodell, PT  Office # [phone] Pager [phone]    [name] 05/08/2020, 11:22 AM

## 2020-05-08 NOTE — Care Management Important Message (Signed)
Important Message  Patient Details  Name: Sheila Ray MRN: [id number] Date of Birth: 1941/06/10   Medicare Important Message Given:  Yes     Nikeisha Klutz [address] 05/08/2020, 4:26 PM

## 2020-05-08 NOTE — Progress Notes (Signed)
STROKE TEAM PROGRESS NOTE   INTERVAL HISTORY Patient is sitting up in bed.  She remains with mild aphasia and right hemiplegia.  Vital signs are stable.  Patient is agreeable to switching Xarelto to Eliquis as it did not help prevent her stroke.  OBJECTIVE Vitals:   05/08/20 0350 05/08/20 0857 05/08/20 0930 05/08/20 1208  BP: (!) 146/70 128/66 115/63 117/63  Pulse: 65 84 75 72  Resp: 18 18 20 19   Temp: 98.2 F (36.8 C) 98.4 F (36.9 C) (!) 97.5 F (36.4 C) 97.6 F (36.4 C)  TempSrc: Oral Oral Oral Oral  SpO2: 96% 99% 98% 99%  Weight:      Height:       CBC:  Recent Labs  Lab 05/03/20 1254 05/03/20 1259 05/04/20 0317 05/05/20 0051 05/07/20 0445 05/08/20 0110  WBC 13.4*   < > 19.2*   < > 8.7 13.1*  NEUTROABS 10.9*  --  17.5*  --   --   --   HGB 11.5*   < > 10.9*   < > 9.3* 10.5*  HCT 37.6   < > 37.4   < > 29.9* 34.6*  MCV 77.8*   < > 82.4   < > 75.7* 76.2*  PLT 338   < > 407*   < > 279 405*   < > = values in this interval not displayed.   Basic Metabolic Panel:  Recent Labs  Lab 05/04/20 0442 05/05/20 0051 05/05/20 0456 05/06/20 0452 05/07/20 0445 05/08/20 0110  NA  --    < >  --    < > 130* 132*  K  --    < >  --    < > 3.9 3.6  CL  --    < >  --    < > 101 104  CO2  --    < >  --    < > 19* 19*  GLUCOSE  --    < >  --    < > 112* 123*  BUN  --    < >  --    < > 26* 20  CREATININE  --    < >  --    < > 0.94 0.82  CALCIUM  --    < >  --    < > 8.3* 8.7*  MG 1.7  --  2.0  --   --   --    < > = values in this interval not displayed.   Lipid Panel:     Component Value Date/Time   CHOL 173 05/04/2020 0317   CHOL 226 (H) 09/16/2019 1136   CHOL 173 01/06/2013 1354   TRIG 65 05/04/2020 0317   TRIG 120 05/06/2013 1341   TRIG 95 01/06/2013 1354   HDL 36 (L) 05/04/2020 0317   HDL 43 09/16/2019 1136   HDL 47 05/06/2013 1341   HDL 41 01/06/2013 1354   CHOLHDL 4.8 05/04/2020 0317   VLDL 13 05/04/2020 0317   LDLCALC 124 (H) 05/04/2020 0317   LDLCALC 158 (H)  09/16/2019 1136   LDLCALC 109 (H) 05/06/2013 1341   LDLCALC 113 (H) 01/06/2013 1354   HgbA1c:  Lab Results  Component Value Date   HGBA1C 6.4 (H) 05/04/2020   Urine Drug Screen: No results found for: LABOPIA, COCAINSCRNUR, LABBENZ, AMPHETMU, THCU, LABBARB  Alcohol Level     Component Value Date/Time   ETH <10 05/03/2020 1317    IMAGING  CT HEAD CODE STROKE  WO CONTRAST CT Angio Head W or Wo Contrast CT Angio Neck W and/or Wo Contrast 05/03/2020    No acute intracranial hemorrhage. No acute infarction identified.   ASPECT score is 10.   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   No hemodynamically significant stenosis the neck.   Partially imaged mild interstitial pulmonary edema and small bilateral pleural effusions.  CT CEREBRAL PERFUSION W CONTRAST 05/03/2020 No core infarction identified. 67 mL of penumbra in the posterior left MCA territory.  Neuro Interventional Radiology - Cerebral Angiogram with Intervention - Dr Estanislado Pandy 05/03/2020 4:55 PM RT common carotid arteriogram followed by revascularization of occluded sup division M2 branch with x 2 passes with Contact aspiration using Zoom 35 and 55 aspiration catheters and partially of  the distal M3 region of inf division with mechanical thrombolysis achieving a TICI 2C revascularization. Recanalizing Rt ACA A2/A3 junction filling defect noted .  MR BRAIN WO CONTRAST 05/03/2020 1. 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 Associated minimal petechial hemorrhage at the left temporal region without significant mass effect.  2. Underlying age-related cerebral atrophy with moderate chronic microvascular ischemic disease.   MR ANGIO HEAD WO CONTRAST 05/03/2020 1. Technically limited exam due to motion artifact.  2. Previously identified occluded left M2 branch appears grossly patent status post revascularization.  3.  Otherwise grossly stable and negative intracranial MRA. No other large vessel occlusion. No other hemodynamically significant or correctable stenosis.   DG CHEST PORT 1 VIEW 05/03/2020 Vascular congestion with mild parenchymal edema on the right consistent with congestive failure.  Transthoracic Echocardiogram  1. Left ventricular ejection fraction, by estimation, is 55 to 60%. The left ventricle has normal function. The left ventricle has no regional wall motion abnormalities. There is moderate concentric left ventricular hypertrophy. Left ventricular diastolic function could not be evaluated.  2. Right ventricular systolic function is mildly reduced. The right ventricular size is moderately enlarged. There is moderately elevated pulmonary artery systolic pressure. The estimated right ventricular systolic pressure is 62.7 mmHg.  3. Left atrial size was moderately dilated.  4. Right atrial size was severely dilated.  5. A small pericardial effusion is present. The pericardial effusion is posterior to the left ventricle. Large pleural effusion in the left lateral region.  6. The mitral valve is normal in structure. Moderate mitral valve regurgitation. No evidence of mitral stenosis.  7. Tricuspid valve regurgitation is moderate to severe.  8. The aortic valve is normal in structure. Aortic valve regurgitation is trivial. No aortic stenosis is present.  9. The inferior vena cava is normal in size with greater than 50% respiratory variability, suggesting right atrial pressure of 3 mmHg.   ECG - atrial fibrillation - ventricular response 131 BPM (See cardiology reading for complete details)  EEG - This study is suggestive of cortical dysfunction in left hemisphere, maximal left temporal region consistent with underlying stroke.  No seizures or definite epileptiform discharges were seen throughout the recording.   PHYSICAL EXAM     Temp:  [97.5 F (36.4 C)-98.4 F (36.9 C)] 97.6 F (36.4  C) (11/08 1208) Pulse Rate:  [54-90] 72 (11/08 1208) Resp:  [18-20] 19 (11/08 1208) BP: (114-152)/(63-89) 117/63 (11/08 1208) SpO2:  [96 %-99 %] 99 % (11/08 1208)  General - Well nourished, well developed, in no apparent distress, lethargic.  Ophthalmologic - fundi not visualized due to noncooperation.  Neuro - awake, alert, expressive aphasia but no dysarthria,  able to follow most simple verbal command can repeat 3 words, cannot name when holding up thumb. Left gaze preference, but able to gaze bilaterally and tracks me across the room. Inconsistently blinking to visual threat right but consistent on the left . PERRL, no significant facial droop. Tongue protrusion can stick tongue out with verbal command is midline. LUE strong and active, no drift. LLE brisk movement 3/5 on pain. RUE 1/5 . RLE mild withdraw to pain and can wiggle toes, Sensation, coordination not cooperative and gait not tested.    ASSESSMENT/PLAN Sheila Ray is a 79 y.o. 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 She did not receive IV t-PA due to anticoagulation with Xarelto. IR - Cerebral Angiogram with Intervention - 05/03/20 - revascularization of occluded sup division M2 branch.  Stroke: Lt MCA scattered infarcts due to left M2 occlusion s/p [id number] - embolic - likely from Afib even on Xarelto.  CT Head - No acute intracranial hemorrhage. No acute infarction identified. ASPECT score is 10.      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 No hemodynamically significant stenosis the neck.   CT Perfusion - No core infarction identified. 67 mL of penumbra in the posterior left MCA territory.  MRI head x 2 - Patchy acute ischemic infarcts  involving the MCA and ACA distributions, predominantly cortical in nature.    MRA head x 2 -  Previously identified occluded left M2 branch appears grossly patent status post revascularization.   EEG - left cortical dysfunction, no seizure  2D Echo - EF 55-60%  [employer] Virus 2 - negative  LDL - 124  HgbA1c - 6.4  VTE prophylaxis - Warren Heparin  Xarelto (rivaroxaban) daily prior to admission, now on ASA 325mg  daily.  Plan to resume anticoagulation today and to switch Xarelto to Eliquis due to failure.    Therapy recommendations:  CIR  Disposition:  Pending  Afib RVR  On amiodarone IV drip  On metoprolol PRN  On Xarelto PTA  Now on ASA 325.  Will switch Xarelto to Eliquis today as it is day 5 following her stroke   rate difficult to control - Cardiology consulted, appreciate help  On amio gtt and po cardizem -> d/c'd  Now on metoprolol and Cardizem - rate control improved. (off amiodarone)  Elevated troponin  Trop 68->63->73  Cardiology on board  Considering related to Afib instead of ischemia  Hypertension->Hypotension  Home BP meds: Toprol ; Zestril  Current BP meds: metoprolol 50 bid and diltiazem 90 q6->30 at hs and 60 this am     Blood pressure stable  . Long-term BP goal normotensive  Hyperlipidemia  Home Lipid lowering medication: none   LDL 124, goal < 70  Put on Zetia  Hx of statin intolerance (Lipitor ; Pravastatin ; Crestor)  Diabetes  Home diabetic meds: metformin  Current diabetic meds: SSI   HgbA1c 6.4, goal < 7.0  CBG monitoring  PCP follow up  Other Stroke Risk Factors  Advanced age  Obesity, recommend weight loss, diet and exercise as appropriate   Hx stroke/TIA - left thalamic stroke (no details)  Other Active Problems  Code status - Full code   Leukocytosis - WBC's -  13.4->15.6->19.2->16.2->11.1->8.7->13.1 (afebrile)   Hyponatremia - Na - 129 ->130->132  continue to follow  Mild hypotension  Hospital day  # 5 Plan start Eliquis for stroke prevention and dosage as per pharmacy consult.  Mobilize out of bed.  Continue ongoing therapies.  Transfer to rehab when bed available.  Greater than 50% time during this 25-minute visit was spent on counseling and coordination of care and discussion with care team. Antony Contras, MD To contact Stroke Continuity provider, please refer to [email or web address] After hours, contact General Neurology

## 2020-05-09 DIAGNOSIS — I4819 Other persistent atrial fibrillation: Secondary | ICD-10-CM

## 2020-05-09 LAB — GLUCOSE, CAPILLARY
Glucose-Capillary: 122 mg/dL — ABNORMAL HIGH (ref 70–99)
Glucose-Capillary: 122 mg/dL — ABNORMAL HIGH (ref 70–99)
Glucose-Capillary: 133 mg/dL — ABNORMAL HIGH (ref 70–99)
Glucose-Capillary: 134 mg/dL — ABNORMAL HIGH (ref 70–99)

## 2020-05-09 LAB — CBC
HCT: 33.9 % — ABNORMAL LOW (ref 36.0–46.0)
Hemoglobin: 10 g/dL — ABNORMAL LOW (ref 12.0–15.0)
MCH: 23.1 pg — ABNORMAL LOW (ref 26.0–34.0)
MCHC: 29.5 g/dL — ABNORMAL LOW (ref 30.0–36.0)
MCV: 78.3 fL — ABNORMAL LOW (ref 80.0–100.0)
Platelets: 349 10*3/uL (ref 150–400)
RBC: 4.33 MIL/uL (ref 3.87–5.11)
RDW: 16.8 % — ABNORMAL HIGH (ref 11.5–15.5)
WBC: 13.8 10*3/uL — ABNORMAL HIGH (ref 4.0–10.5)
nRBC: 0 % (ref 0.0–0.2)

## 2020-05-09 LAB — BASIC METABOLIC PANEL
Anion gap: 11 (ref 5–15)
BUN: 18 mg/dL (ref 8–23)
CO2: 15 mmol/L — ABNORMAL LOW (ref 22–32)
Calcium: 8.8 mg/dL — ABNORMAL LOW (ref 8.9–10.3)
Chloride: 109 mmol/L (ref 98–111)
Creatinine, Ser: 0.82 mg/dL (ref 0.44–1.00)
GFR, Estimated: >60 mL/min (ref >60)
Glucose, Bld: 115 mg/dL — ABNORMAL HIGH (ref 70–99)
Potassium: 3.6 mmol/L (ref 3.5–5.1)
Sodium: 135 mmol/L (ref 135–145)

## 2020-05-09 MED ORDER — POTASSIUM CHLORIDE CRYS ER 20 MEQ PO TBCR
40.0000 meq | EXTENDED_RELEASE_TABLET | Freq: Once | ORAL | Status: AC
  Administered 2020-05-09: 40 meq via ORAL
  Filled 2020-05-09: qty 2

## 2020-05-09 NOTE — Consult Note (Signed)
Physical Medicine and Rehabilitation Consult Reason for Consult: Right side weakness and facial droop with mild expressive aphasia Referring Physician: Dr. Leonie Man   HPI: Sheila Ray is a 79 y.o. 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 Per chart review patient lives alone reportedly independent driving. One level home three steps to entry. Presented 05/03/2020 with right side weakness facial droop expressive aphasia and altered mental status. Her heart rate was noted to be in the one forties. Cranial CT scan showed no acute intracranial hemorrhage no acute infarction. 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 No hemodynamically significant stenosis in the neck. Patient underwent revascularization of occluded superior division M2 branch per interventional radiology. MRI of the brain showed cortical restricted diffusion within the left temporal occipital region. Scattered foci of restricted diffusion also seen in the left frontal parietal and insular regions and left amygdala. MRA with no proximal occlusion or stenosis. EEG negative for seizure. Echocardiogram with ejection fraction 55 to 60% no wall motion abnormalities. Neurology follow-up as well as cardiology services patient Xarelto was changed to Eliquis. She remains on Cardizem as well as metoprolol for her A. fib. Tolerating a regular diet. Therapy evaluations completed with recommendations of physical medicine rehab consult.   Pt reports hard/frequent voiding- sometimes burns   Review of Systems  Constitutional: Positive for malaise/fatigue. Negative for chills and fever.  HENT: Negative for hearing loss.   Eyes: Negative for blurred vision and double vision.  Respiratory: Negative for cough and shortness of breath.     Cardiovascular: Positive for palpitations and leg swelling. Negative for chest pain.  Gastrointestinal: Positive for constipation. Negative for heartburn, nausea and vomiting.  Genitourinary: Negative for dysuria, flank pain and hematuria.  Musculoskeletal: Positive for myalgias.  Skin: Negative for rash.  All other systems reviewed and are negative.  Past Medical History:  Diagnosis Date  . Arthritis   . Atrial fibrillation ([place])   . Complication of anesthesia   . Diabetes mellitus without complication ([place])   . Diverticulosis   . Fatigue   . Gastric polyp   . Goiter   . Hyperlipidemia   . Hypertension   . Hypothyroid    taken off of thyroid medication 2012014   . Obese   . Osteopenia   . PONV (postoperative nausea and vomiting)   . Postmenopausal   . Rosacea   . Stroke ([place]) 01/15/2013   left thalamic  stroke, small vessel  . Vitamin D deficiency    Past Surgical History:  Procedure Laterality Date  . ABDOMINAL HYSTERECTOMY  1985  . FOOT SURGERY Right 08/2002  . IR CT HEAD LTD  05/03/2020  . IR PERCUTANEOUS ART THROMBECTOMY/INFUSION INTRACRANIAL INC DIAG ANGIO  05/03/2020  . RADIOLOGY WITH ANESTHESIA N/A 05/03/2020   Procedure: IR WITH ANESTHESIA;  Surgeon: Radiologist, Medication, MD;  Location: [facility];  Service: Radiology;  Laterality: N/A;  . right knee arthroscopy   2013   . TONSILLECTOMY    . 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 Osteoporosis Mother   . Alzheimer's disease Mother 97  . Arthritis Mother   . Cancer Father  Lung  . Arthritis Sister   . Obesity Sister   . Heart attack Brother 43  . Heart disease Son   . Arthritis Brother   . Arthritis Sister   . Cancer Sister        breast cancer   Social History:  reports that she has never smoked. She has never  used smokeless tobacco. She reports that she does not drink alcohol and does not use drugs. Allergies:  Allergies  Allergen Reactions  . Crestor [Rosuvastatin] Other (See Comments)    Cramps  . Lipitor [Atorvastatin] Other (See Comments)    Cramps  . Pravastatin Other (See Comments)    Cramps   . Keflex [Cephalexin] Hives   Medications Prior to Admission  Medication Sig Dispense Refill  . alendronate (FOSAMAX) 70 MG tablet TAKE 1 TABLET BY MOUTH  WEEKLY AS DIRECTED (Patient taking differently: Take 70 mg by mouth once a week. ) 12 tablet 3  . Cyanocobalamin (VITAMIN B12) 1000 MCG TBCR Take 1,000 mcg by mouth daily.     Marland Kitchen lisinopril (ZESTRIL) 40 MG tablet TAKE 1 TABLET BY MOUTH  DAILY (Patient taking differently: Take 40 mg by mouth daily. ) 90 tablet 3  . Magnesium Oxide (MAG-OX 400 PO) Take 400 mg by mouth daily.    . metFORMIN (GLUCOPHAGE) 1000 MG tablet Take 0.5 tablets (500 mg total) by mouth daily with breakfast. 90 tablet 0  . metoprolol succinate (TOPROL-XL) 50 MG 24 hr tablet TAKE 1 TABLET BY MOUTH  DAILY WITH OR IMMEDIATELY  FOLLOWING A MEAL (Patient taking differently: Take 37.5 mg by mouth in the morning and at bedtime. ) 90 tablet 0  . XARELTO 20 MG TABS tablet TAKE 1 TABLET BY MOUTH  DAILY (Patient taking differently: Take 20 mg by mouth daily with supper. ) 90 tablet 0  . diltiazem (CARDIZEM CD) 240 MG 24 hr capsule TAKE 1 CAPSULE BY MOUTH  DAILY (Patient taking differently: Take 240 mg by mouth daily. ) 90 capsule 3  . glucose blood (ONE TOUCH ULTRA TEST) test strip USE TO CHECK BLOOD GLUCOSE  ONCE A DAY AS INSTRUCTED 100 each 6  . ONETOUCH DELICA LANCETS 10F MISC 1 each by Does not apply route daily. Use to check BG daily 100 each 2    Home: Home Living Family/patient expects to be discharged to:: Private residence Living Arrangements: Alone Type of Home: House Home Access: Stairs to enter Technical brewer of Steps: 3 Entrance Stairs-Rails: Right, Left Home  Layout: One level Bathroom Shower/Tub: Multimedia programmer: Standard Home Equipment: None Additional Comments: per chart review  Lives With: Alone  Functional History: Prior Function Level of Independence: Independent Comments: drove, I in ADL's and iADL's Functional Status:  Mobility: Bed Mobility Overal bed mobility: Needs Assistance Bed Mobility: Supine to Sit, Sit to Sidelying, Rolling Rolling: Max assist, +2 for physical assistance Supine to sit: Total assist, +2 for physical assistance Sit to supine: Total assist, +2 for physical assistance Sit to sidelying: Total assist, +2 for physical assistance, +2 for safety/equipment General bed mobility comments: assist with all aspects of mobility Transfers Overall transfer level: Needs assistance Equipment used: 2 person hand held assist Transfers: Sit to/from Stand Sit to Stand: +2 physical assistance, Total assist General transfer comment: unable to attain full upright stance, clearing bottom from bed but bracing BLE on bed. Attempted x 2. Ambulation/Gait General Gait Details: unable    ADL: ADL Overall ADL's : Needs assistance/impaired Grooming: Maximal assistance, Sitting Grooming Details (  indicate cue type and reason): hand over hand L UE to comb hair, unable to maintain task after initation without support Upper Body Bathing: Maximal assistance, Sitting Lower Body Bathing: Total assistance, +2 for physical assistance, +2 for safety/equipment, Sit to/from stand Upper Body Dressing : Total assistance, Sitting Lower Body Dressing: Total assistance, +2 for physical assistance, +2 for safety/equipment, Sit to/from stand Toilet Transfer Details (indicate cue type and reason): deferred  Functional mobility during ADLs: +2 for physical assistance, Total assistance, +2 for safety/equipment General ADL Comments: pt limited by impaired cognition, anxiety, weakness, impaired commuincation   Cognition: Cognition Overall  Cognitive Status: Difficult to assess Orientation Level: Oriented to person, Oriented to place Cognition Arousal/Alertness: Lethargic Behavior During Therapy: Flat affect, Anxious Overall Cognitive Status: Difficult to assess Area of Impairment: Awareness, Problem solving, Following commands Following Commands: Follows one step commands inconsistently, Follows one step commands with increased time Awareness: Intellectual Problem Solving: Slow processing, Decreased initiation, Difficulty sequencing, Requires verbal cues, Requires tactile cues General Comments: patient with slow processing, requires increased time to follow simple 1 step commands and is very inconsistent Difficult to assess due to: Impaired communication  Blood pressure 134/75, pulse 64, temperature 97.6 F (36.4 C), temperature source Axillary, resp. rate 19, height 5' 4.5" (1.638 m), weight 79.4 kg, SpO2 96 %. Physical Exam Vitals and nursing note reviewed.  Constitutional:      Comments: Pt is sitting up in bed- finished 50% lunch- not watching tv, just sitting there, NAD  HENT:     Head: Normocephalic and atraumatic.     Comments: On O2 1L by Nixon- humidified Smile equal- tongue red/rough- midline    Right Ear: External ear normal.     Left Ear: External ear normal.     Nose: Nose normal. No congestion.     Mouth/Throat:     Mouth: Mucous membranes are dry.     Pharynx: Oropharynx is clear. No oropharyngeal exudate.  Eyes:     General:        Right eye: No discharge.        Left eye: No discharge.     Extraocular Movements: Extraocular movements intact.  Cardiovascular:     Rate and Rhythm: Normal rate and regular rhythm.  Pulmonary:     Comments: Somewhat wheezy- a few rhonchi also heard- good air movement B/L Abdominal:     Comments: Soft, NT, ND, (+)BS    Genitourinary:    Comments: Has purewick Musculoskeletal:     Cervical back: Normal range of motion. No rigidity.     Comments: Hard ot get pt to  follow/finish commands RUE- 2/5 grip- but wouldn't/couldn't move otherwise LUE at least 4/5- but hard to assess RLE- 2-/5 in HF, KE, DF and PF LLE_ at least 4/5- needs reminders to use RLE   Skin:    Comments: IV L forearm- looks OK  Neurological:     Comments: Patient is alert in no acute distress makes eye contact with examiner. Follows simple commands. Mild left gaze preference. She does exhibit some expressive aphasia. Pt very aphasic- can speak at phrase level- but made a lot of mistakes/word substitution Said light touch intact x 4  Psychiatric:     Comments: Sad affect     Results for orders placed or performed during the hospital encounter of 05/03/20 (from the past 24 hour(s))  Glucose, capillary     Status: Abnormal   Collection Time: 05/08/20 11:48 AM  Result Value Ref Range   Glucose-Capillary  129 (H) 70 - 99 mg/dL  Glucose, capillary     Status: Abnormal   Collection Time: 05/08/20 12:11 PM  Result Value Ref Range   Glucose-Capillary 109 (H) 70 - 99 mg/dL   Comment 1 Notify RN    Comment 2 Document in Chart   Glucose, capillary     Status: Abnormal   Collection Time: 05/08/20  4:53 PM  Result Value Ref Range   Glucose-Capillary 123 (H) 70 - 99 mg/dL   Comment 1 Notify RN    Comment 2 Document in Chart   Glucose, capillary     Status: Abnormal   Collection Time: 05/08/20  9:10 PM  Result Value Ref Range   Glucose-Capillary 138 (H) 70 - 99 mg/dL   Comment 1 Notify RN    Comment 2 Document in Chart   CBC     Status: Abnormal   Collection Time: 05/09/20  3:53 AM  Result Value Ref Range   WBC 13.8 (H) 4.0 - 10.5 K/uL   RBC 4.33 3.87 - 5.11 MIL/uL   Hemoglobin 10.0 (L) 12.0 - 15.0 g/dL   HCT 33.9 (L) 36 - 46 %   MCV 78.3 (L) 80.0 - 100.0 fL   MCH 23.1 (L) 26.0 - 34.0 pg   MCHC 29.5 (L) 30.0 - 36.0 g/dL   RDW 16.8 (H) 11.5 - 15.5 %   Platelets 349 150 - 400 K/uL   nRBC 0.0 0.0 - 0.2 %  Basic metabolic panel     Status: Abnormal   Collection Time: 05/09/20   3:53 AM  Result Value Ref Range   Sodium 135 135 - 145 mmol/L   Potassium 3.6 3.5 - 5.1 mmol/L   Chloride 109 98 - 111 mmol/L   CO2 15 (L) 22 - 32 mmol/L   Glucose, Bld 115 (H) 70 - 99 mg/dL   BUN 18 8 - 23 mg/dL   Creatinine, Ser 0.82 0.44 - 1.00 mg/dL   Calcium 8.8 (L) 8.9 - 10.3 mg/dL   GFR, Estimated >60 >60 mL/min   Anion gap 11 5 - 15  Glucose, capillary     Status: Abnormal   Collection Time: 05/09/20  6:19 AM  Result Value Ref Range   Glucose-Capillary 122 (H) 70 - 99 mg/dL   Comment 1 Notify RN    Comment 2 Document in Chart    No results found.   Assessment/Plan: 1. Diagnosis: L MCA/ACA with R hemiparesis 2. Does the need for close, 24 hr/day medical supervision in concert with the patient's rehab needs make it unreasonable for this patient to be served in a less intensive setting? Yes 3. Co-Morbidities requiring supervision/potential complications: afib, previous infarct, CHF, HTN, hypothyroidism- no meds 4. Due to bladder management, bowel management, safety, skin/wound care, disease management, medication administration, pain management and patient education, does the patient require 24 hr/day rehab nursing? Yes 5. Does the patient require coordinated care of a physician, rehab nurse, therapy disciplines of PT, OT and SLP to address physical and functional deficits in the context of the above medical diagnosis(es)? Yes Addressing deficits in the following areas: balance, endurance, locomotion, strength, transferring, bowel/bladder control, bathing, dressing, feeding, grooming, toileting, cognition, speech and language 6. Can the patient actively participate in an intensive therapy program of at least 3 hrs of therapy per day at least 5 days per week? Yes 7. The potential for patient to make measurable gains while on inpatient rehab is good 8. Anticipated functional outcomes upon discharge from inpatient rehab  are supervision and min assist  with PT, supervision and min  assist with OT, supervision and min assist with SLP. 9. Estimated rehab length of stay to reach the above functional goals is: 2-3 weeks 10. Anticipated discharge destination: Home 11. Overall Rehab/Functional Prognosis: good  RECOMMENDATIONS: This patient's condition is appropriate for continued rehabilitative care in the following setting: CIR Patient has agreed to participate in recommended program. Potentially Note that insurance prior authorization may be required for reimbursement for recommended care.  Comment:  1. Suggest checking for UTI- pt c/o Sx's with specific questioning.  2. If not, pt's WBC is elevated- please determine cause 3. Will submit for insurance for inpt rehab- CIR 4. Thank you for this consult.      Lavon Paganini Angiulli, PA-C 05/09/2020

## 2020-05-09 NOTE — Progress Notes (Signed)
  Speech Language Pathology Treatment: Dysphagia;Cognitive-Linquistic  Patient Details Name: Sheila Ray MRN: [id number] DOB: [birth year] Today's Date: 05/09/2020 Time: 0300-9233 SLP Time Calculation (min) (ACUTE ONLY): 23 min  Assessment / Plan / Recommendation Clinical Impression  Pt was seen for treatment and was alert and cooperative. She demonstrated mild difficulty with auditory comprehension during simple conversation and functional tasks. SLP provided occasional verbal cues and rephrasing in order to increase her understanding. During a functional task, she demonstrated difficulty fully attending to the R in order to read names of medications. With min verbal cues, her attention to the R increased. Paraphasias and word-finding errors continue to be noted during her speech and she is inconsistently able to self-correct. She tolerated thin liquid without any overt s/sx of aspiration. It is recommended that she remain on regular diet with thin liquids. SLP will continue to f/u with pt for cognitive-linguistic treatment and diet toleration.   HPI HPI: Pt is a 79 y.o. female who has a PMH including but not limited to A.fib on xarelto, HTN, HLD, DM, hypothyroidism, prior left thalamic stroke with residual deficits.  She presented to the ED 11/3 with confusion, aphasia, right facial droop, right leg weakness.  She was found to have Left M2/M3 occlusion with penumbra of 89ml.  She was taken to IR and had right common carotid arteriogram followed by revascularization of occluded superior division M2 branch and partial revascularization of the distal M3 branch with mechanical thrombolysis.  Post CT brain showed no ICH or mass effect. She was intubated 11/3 for procedure. EEg: cortical dysfunction in left hemisphere, maximal left temporal region consistent with underlying stroke. No seizures.       SLP Plan  Continue with current plan of care       Recommendations  Diet recommendations:  Regular;Thin liquid Liquids provided via: Cup;Straw Medication Administration: Whole meds with puree Supervision: Staff to assist with self feeding Compensations: Minimize environmental distractions Postural Changes and/or Swallow Maneuvers: Seated upright 90 degrees                Oral Care Recommendations: Oral care BID Follow up Recommendations: Inpatient Rehab SLP Visit Diagnosis: Aphasia (R47.01);Dysphagia, unspecified (R13.10) Plan: Continue with current plan of care       GO                Greggory Keen 05/09/2020, 2:04 PM

## 2020-05-09 NOTE — Progress Notes (Signed)
Inpatient Rehabilitation Admissions Coordinator  Inpatient rehab consult received. I met with patient at bedside and spoke with her son, Elta Guadeloupe, by phone. We discussed goals and expectations of a possible Cir admit. He would like to pursue CIR admit and then patient would d/c to his home where they can provide 24/7 care. I will begin  insurance authorization for a possible CIR admit pending payor approval.  Danne Baxter, RN, MSN Rehab Admissions Coordinator [phone] 05/09/2020 4:16 PM

## 2020-05-09 NOTE — Progress Notes (Addendum)
Progress Note  Patient Name: Sheila Ray Date of Encounter: 05/09/2020  Primary Cardiologist:   Donato Heinz, MD   Subjective   She is verbal, trouble w/ word-finding Denies CP or SOB Not sure if she took Xarelto every day (I feel these answers were responsive)  Inpatient Medications    Scheduled Meds: .  stroke: mapping our early stages of recovery book   Does not apply Once  . apixaban  5 mg Oral BID  . diltiazem  60 mg Oral Q8H  . ezetimibe  10 mg Oral Daily  . insulin aspart  0-6 Units Subcutaneous TID AC & HS  . ipratropium  1 spray Each Nare BID  . mouth rinse  15 mL Mouth Rinse BID  . metoprolol tartrate  50 mg Oral BID  . nystatin   Topical BID   Continuous Infusions: . sodium chloride Stopped (05/03/20 2315)   PRN Meds: acetaminophen **OR** acetaminophen (TYLENOL) oral liquid 160 mg/5 mL **OR** acetaminophen, metoprolol tartrate, prochlorperazine, senna-docusate   Vital Signs    Vitals:   05/08/20 2033 05/08/20 2308 05/09/20 0437 05/09/20 0923  BP: 126/75 139/66 134/75 117/76  Pulse: 73 78 64 83  Resp: (!) 23 17 19    Temp: (!) 97.5 F (36.4 C) 97.7 F (36.5 C) 97.6 F (36.4 C) 97.7 F (36.5 C)  TempSrc: Oral Axillary Axillary Oral  SpO2: 96% 98% 96% 98%  Weight:      Height:        Intake/Output Summary (Last 24 hours) at 05/09/2020 1107 Last data filed at 05/09/2020 0559 Gross per 24 hour  Intake --  Output 1650 ml  Net -1650 ml   Filed Weights   05/06/20 1732  Weight: 79.4 kg    Telemetry    Afib, HR low 50s at times but mostly normal, no pauses > 2.5 sec- Personally Reviewed  ECG    NA - Personally Reviewed  Physical Exam   GEN: No acute distress.   Neck: No JVD Cardiac: Irreg R&R, no murmur, no rubs, or gallops.  Respiratory: diminished to auscultation bilaterally with rales in the bases. GI: Soft, nontender, non-distended  MS: No edema; No deformity. Neuro:  No new deficits Psych: Normal affect   Labs     Chemistry Recent Labs  Lab 05/03/20 1317 05/03/20 1320 05/07/20 0445 05/08/20 0110 05/09/20 0353  NA 131*   < > 130* 132* 135  K 3.7   < > 3.9 3.6 3.6  CL 102   < > 101 104 109  CO2 22   < > 19* 19* 15*  GLUCOSE 132*   < > 112* 123* 115*  BUN 11   < > 26* 20 18  CREATININE 0.64   < > 0.94 0.82 0.82  CALCIUM 8.6*   < > 8.3* 8.7* 8.8*  PROT 5.7*  --   --   --   --   ALBUMIN 3.0*  --   --   --   --   AST 15  --   --   --   --   ALT 12  --   --   --   --   ALKPHOS 52  --   --   --   --   BILITOT 0.5  --   --   --   --   GFRNONAA >60   < > >60 >60 >60  ANIONGAP 7   < > 10 9 11    < > =  values in this interval not displayed.     Hematology Recent Labs  Lab 05/07/20 0445 05/08/20 0110 05/09/20 0353  WBC 8.7 13.1* 13.8*  RBC 3.95 4.54 4.33  HGB 9.3* 10.5* 10.0*  HCT 29.9* 34.6* 33.9*  MCV 75.7* 76.2* 78.3*  MCH 23.5* 23.1* 23.1*  MCHC 31.1 30.3 29.5*  RDW 16.5* 16.6* 16.8*  PLT 279 405* 349    Cardiac EnzymesNo results for input(s): TROPONINI in the last 168 hours. No results for input(s): TROPIPOC in the last 168 hours.   BNPNo results for input(s): BNP, PROBNP in the last 168 hours.   DDimer No results for input(s): DDIMER in the last 168 hours.   Radiology    No results found.  Cardiac Studies   ECHO:  Transthoracic Echocardiogram  1. Left ventricular ejection fraction, by estimation, is 55 to 60%. The left ventricle has normal function. The left ventricle has no regional wall motion abnormalities. There is moderate concentric left ventricular hypertrophy. Left ventricular diastolic function could not be evaluated.  2. Right ventricular systolic function is mildly reduced. The right ventricular size is moderately enlarged. There is moderately elevated pulmonary artery systolic pressure. The estimated right ventricular systolic pressure is 33.3 mmHg.  3. Left atrial size was moderately dilated.  4. Right atrial size was severely dilated.  5. A small  pericardial effusion is present. The pericardial effusion is posterior to the left ventricle. Large pleural effusion in the left lateral region.  6. The mitral valve is normal in structure. Moderate mitral valve regurgitation. No evidence of mitral stenosis.  7. Tricuspid valve regurgitation is moderate to severe.  8. The aortic valve is normal in structure. Aortic valve regurgitation is trivial. No aortic stenosis is present.  9. The inferior vena cava is normal in size with greater than 50% respiratory variability, suggesting right atrial pressure of 3 mmHg.   Patient Profile     79 y.o. female hx of atrial fibrillation (Xerelto), HLD, HTN, CVA (2014), hypothyroidism, DM,  who is being seen for the evaluation of atrial fibrillation.    Assessment & Plan    1. ATRIAL FIB WITH RVR:  - HR is controlled, some slower HR yesterday - HR improved some today after Cardizem 90 mg qid >> 60 mg qid >> 60 mg tid, follow on telemetry today - also on metoprolol 50 mg bid, MD advise on change to Toprol XL 100 mg in am - on Xarelto pta, now on Eliquis - BP stable on current rx - biatrial dilatation on echo, may need to continue rate control w/ anticoag  Otherwise, per Neurology   MD advise if [facility] will sign off.   Medication Recommendations:  Toprol XL 100 mg qd, Cardizem CD 180 mg qd, Eliquis 5 mg bid Other recommendations (labs, testing, etc):  none Follow up as an outpatient:  We will arrange f/u with Dr. Gardiner Rhyme   For questions or updates, please contact [place] Please consult [email or web address] for contact info under Cardiology/STEMI.   Signed, Rosaria Ferries, PA-C  05/09/2020, 11:07 AM    History and all data above reviewed.  Patient examined.  I agree with the findings as above. She has aphasia but denies pain.    The patient exam reveals LKT:[id number]  ,  Lungs: Clear  ,  Abd: Positive bowel sounds, no rebound no guarding, Ext No edema  .  All available labs, radiology  testing, previous records reviewed. Agree with documented assessment and plan.  Atrial fib:  Discharge meds  as above.     Jeneen Rinks Sahmir Weatherbee  11:54 AM  05/09/2020

## 2020-05-09 NOTE — Progress Notes (Signed)
STROKE TEAM PROGRESS NOTE   INTERVAL HISTORY Patient is sitting up in bed.  She remains with mild aphasia and right hemiplegia and neurological exam is unchanged from yesterday..  Vital signs are stable.  Patient had some low heart rate yesterday with sinus pauses of 2 seconds.  Cardiology has adjusted her rate control medication and heart rate is better this morning.  She has been seen by therapy recommend inpatient rehab  OBJECTIVE Vitals:   05/08/20 2033 05/08/20 2308 05/09/20 0437 05/09/20 0923  BP: 126/75 139/66 134/75 117/76  Pulse: 73 78 64 83  Resp: (!) 23 17 19    Temp: (!) 97.5 F (36.4 C) 97.7 F (36.5 C) 97.6 F (36.4 C) 97.7 F (36.5 C)  TempSrc: Oral Axillary Axillary Oral  SpO2: 96% 98% 96% 98%  Weight:      Height:       CBC:  Recent Labs  Lab 05/03/20 1254 05/03/20 1259 05/04/20 0317 05/05/20 0051 05/08/20 0110 05/09/20 0353  WBC 13.4*   < > 19.2*   < > 13.1* 13.8*  NEUTROABS 10.9*  --  17.5*  --   --   --   HGB 11.5*   < > 10.9*   < > 10.5* 10.0*  HCT 37.6   < > 37.4   < > 34.6* 33.9*  MCV 77.8*   < > 82.4   < > 76.2* 78.3*  PLT 338   < > 407*   < > 405* 349   < > = values in this interval not displayed.   Basic Metabolic Panel:  Recent Labs  Lab 05/04/20 0442 05/05/20 0051 05/05/20 0456 05/06/20 0452 05/08/20 0110 05/09/20 0353  NA  --    < >  --    < > 132* 135  K  --    < >  --    < > 3.6 3.6  CL  --    < >  --    < > 104 109  CO2  --    < >  --    < > 19* 15*  GLUCOSE  --    < >  --    < > 123* 115*  BUN  --    < >  --    < > 20 18  CREATININE  --    < >  --    < > 0.82 0.82  CALCIUM  --    < >  --    < > 8.7* 8.8*  MG 1.7  --  2.0  --   --   --    < > = values in this interval not displayed.   Lipid Panel:     Component Value Date/Time   CHOL 173 05/04/2020 0317   CHOL 226 (H) 09/16/2019 1136   CHOL 173 01/06/2013 1354   TRIG 65 05/04/2020 0317   TRIG 120 05/06/2013 1341   TRIG 95 01/06/2013 1354   HDL 36 (L) 05/04/2020 0317    HDL 43 09/16/2019 1136   HDL 47 05/06/2013 1341   HDL 41 01/06/2013 1354   CHOLHDL 4.8 05/04/2020 0317   VLDL 13 05/04/2020 0317   LDLCALC 124 (H) 05/04/2020 0317   LDLCALC 158 (H) 09/16/2019 1136   LDLCALC 109 (H) 05/06/2013 1341   LDLCALC 113 (H) 01/06/2013 1354   HgbA1c:  Lab Results  Component Value Date   HGBA1C 6.4 (H) 05/04/2020   Urine Drug Screen: No results found for: LABOPIA, COCAINSCRNUR,  LABBENZ, AMPHETMU, THCU, LABBARB  Alcohol Level     Component Value Date/Time   ETH <10 05/03/2020 1317    IMAGING  CT HEAD CODE STROKE WO CONTRAST CT Angio Head W or Wo Contrast CT Angio Neck W and/or Wo Contrast 05/03/2020    No acute intracranial hemorrhage. No acute infarction identified.   ASPECT score is 10.   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   No hemodynamically significant stenosis the neck.   Partially imaged mild interstitial pulmonary edema and small bilateral pleural effusions.  CT CEREBRAL PERFUSION W CONTRAST 05/03/2020 No core infarction identified. 67 mL of penumbra in the posterior left MCA territory.  Neuro Interventional Radiology - Cerebral Angiogram with Intervention - Dr Estanislado Pandy 05/03/2020 4:55 PM RT common carotid arteriogram followed by revascularization of occluded sup division M2 branch with x 2 passes with Contact aspiration using Zoom 35 and 55 aspiration catheters and partially of  the distal M3 region of inf division with mechanical thrombolysis achieving a TICI 2C revascularization. Recanalizing Rt ACA A2/A3 junction filling defect noted .  MR BRAIN WO CONTRAST 05/03/2020 1. 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 Associated minimal petechial hemorrhage at the left temporal region without significant mass effect.  2. Underlying age-related cerebral atrophy with moderate chronic microvascular ischemic disease.   MR ANGIO HEAD WO  CONTRAST 05/03/2020 1. Technically limited exam due to motion artifact.  2. Previously identified occluded left M2 branch appears grossly patent status post revascularization.  3. Otherwise grossly stable and negative intracranial MRA. No other large vessel occlusion. No other hemodynamically significant or correctable stenosis.   DG CHEST PORT 1 VIEW 05/03/2020 Vascular congestion with mild parenchymal edema on the right consistent with congestive failure.  Transthoracic Echocardiogram  1. Left ventricular ejection fraction, by estimation, is 55 to 60%. The left ventricle has normal function. The left ventricle has no regional wall motion abnormalities. There is moderate concentric left ventricular hypertrophy. Left ventricular diastolic function could not be evaluated.  2. Right ventricular systolic function is mildly reduced. The right ventricular size is moderately enlarged. There is moderately elevated pulmonary artery systolic pressure. The estimated right ventricular systolic pressure is 41.9 mmHg.  3. Left atrial size was moderately dilated.  4. Right atrial size was severely dilated.  5. A small pericardial effusion is present. The pericardial effusion is posterior to the left ventricle. Large pleural effusion in the left lateral region.  6. The mitral valve is normal in structure. Moderate mitral valve regurgitation. No evidence of mitral stenosis.  7. Tricuspid valve regurgitation is moderate to severe.  8. The aortic valve is normal in structure. Aortic valve regurgitation is trivial. No aortic stenosis is present.  9. The inferior vena cava is normal in size with greater than 50% respiratory variability, suggesting right atrial pressure of 3 mmHg.   ECG - atrial fibrillation - ventricular response 131 BPM (See cardiology reading for complete details)  EEG - This study is suggestive of cortical dysfunction in left hemisphere, maximal left temporal region consistent with  underlying stroke.  No seizures or definite epileptiform discharges were seen throughout the recording.   PHYSICAL EXAM      Temp:  [97.5 F (36.4 C)-97.7 F (36.5 C)] 97.7 F (36.5 C) (11/09 0923) Pulse Rate:  [64-87] 83 (11/09 0923) Resp:  [17-23] 19 (11/09 0437) BP: (117-139)/(63-79) 117/76 (11/09 0923) SpO2:  [96 %-99 %] 98 % (11/09 0923)  General - Well nourished, well developed, in no apparent distress, lethargic.  Ophthalmologic - fundi not visualized due to noncooperation.  Neuro - awake, alert, expressive aphasia but no dysarthria,  able to follow most simple verbal command can repeat 3 words, cannot name when holding up thumb. Left gaze preference, but able to gaze bilaterally and tracks me across the room. Inconsistently blinking to visual threat right but consistent on the left . PERRL, no significant facial droop. Tongue protrusion can stick tongue out with verbal command is midline. LUE strong and active, no drift. LLE brisk movement 3/5 on pain. RUE 1/5 . RLE mild withdraw to pain and can wiggle toes, Sensation, coordination not cooperative and gait not tested.    ASSESSMENT/PLAN Ms. ADRIEANA FENNELLY is a 79 y.o. 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 She did not receive IV t-PA due to anticoagulation with Xarelto. IR - Cerebral Angiogram with Intervention - 05/03/20 - revascularization of occluded sup division M2 branch.  Stroke: Lt MCA scattered infarcts due to left M2 occlusion s/p [id number] - embolic - likely from Afib even on Xarelto.  CT Head - No acute intracranial hemorrhage. No acute infarction identified. ASPECT score is 10.      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 No hemodynamically  significant stenosis the neck.   CT Perfusion - No core infarction identified. 67 mL of penumbra in the posterior left MCA territory.  MRI head x 2 - Patchy acute ischemic infarcts involving the MCA and ACA distributions, predominantly cortical in nature.    MRA head x 2 -  Previously identified occluded left M2 branch appears grossly patent status post revascularization.   EEG - left cortical dysfunction, no seizure  2D Echo - EF 55-60%  [employer] Virus 2 - negative  LDL - 124  HgbA1c - 6.4  VTE prophylaxis - [place] Heparin  Xarelto (rivaroxaban) daily prior to admission, now on Eliquis due to Xarelto failure.    Therapy recommendations:  CIR  Disposition:  Pending  Afib RVR  On amiodarone IV drip  On metoprolol PRN  On Xarelto PTA  Have switched Xarelto to Eliquis on high day 5 following her stroke   rate difficult to control - Cardiology consulted, appreciate help  On amio gtt and po cardizem -> d/c'd  Now on metoprolol and Cardizem - rate control improved. (off amiodarone)  Elevated troponin  Trop 68->63->73  Cardiology on board  Considering related to Afib instead of ischemia  Hypertension->Hypotension  Home BP meds: Toprol ; Zestril  Current BP meds: metoprolol 50 bid and diltiazem 90 q6->30 at hs and 60 this am     Blood pressure stable  . Long-term BP goal normotensive  Hyperlipidemia  Home Lipid lowering medication: none   LDL 124, goal < 70  Put on Zetia  Hx of statin intolerance (Lipitor ; Pravastatin ; Crestor)  Diabetes  Home diabetic meds: metformin  Current diabetic meds: SSI   HgbA1c 6.4, goal < 7.0  CBG monitoring  PCP follow up  Other Stroke Risk Factors  Advanced age  Obesity, recommend weight loss, diet and exercise as appropriate   Hx stroke/TIA - left thalamic stroke (no details)  Other Active Problems  Code status - Full code   Leukocytosis - WBC's -  13.4->15.6->19.2->16.2->11.1->8.7->13.1 (afebrile)    Hyponatremia - Na - 129 ->130->132 continue to follow  Mild hypotension  Hospital day # 6  Continue ongoing therapies.  Cardiology adjusting rate control medications and appreciate their help.  Medically stable to be transferred to inpatient rehab when bed becomes available.  Discussed with patient and answered questions.  Greater than 50% time during this 25-minute visit spent on counseling and coordination of care about her stroke and atrial fibrillation and rate control and answering questions. Antony Contras, MD  To contact Stroke Continuity provider, please refer to [email or web address] After hours, contact General Neurology

## 2020-05-09 NOTE — Plan of Care (Signed)
Pt alert to self and place. Tele in place. C/o hip pain and controlled with PRN. VSS. Pt on 2l [place] with humidification. BM today. Purewick in place.  Problem: Education: Goal: Knowledge of disease or condition will improve Outcome: Progressing Goal: Knowledge of secondary prevention will improve Outcome: Progressing Goal: Knowledge of patient specific risk factors addressed and post discharge goals established will improve Outcome: Progressing   Problem: Coping: Goal: Will verbalize positive feelings about self Outcome: Progressing Goal: Will identify appropriate support needs Outcome: Progressing   Problem: Health Behavior/Discharge Planning: Goal: Ability to manage health-related needs will improve Outcome: Progressing   Problem: Self-Care: Goal: Ability to participate in self-care as condition permits will improve Outcome: Progressing

## 2020-05-10 LAB — GLUCOSE, CAPILLARY
Glucose-Capillary: 152 mg/dL — ABNORMAL HIGH (ref 70–99)
Glucose-Capillary: 171 mg/dL — ABNORMAL HIGH (ref 70–99)
Glucose-Capillary: 164 mg/dL — ABNORMAL HIGH (ref 70–99)
Glucose-Capillary: 157 mg/dL — ABNORMAL HIGH (ref 70–99)
Glucose-Capillary: 132 mg/dL — ABNORMAL HIGH (ref 70–99)

## 2020-05-10 MED ORDER — METOPROLOL TARTRATE 50 MG PO TABS
50.0000 mg | ORAL_TABLET | Freq: Two times a day (BID) | ORAL | Status: AC
  Administered 2020-05-10: 50 mg via ORAL
  Filled 2020-05-10: qty 1

## 2020-05-10 MED ORDER — METOPROLOL SUCCINATE ER 100 MG PO TB24
100.0000 mg | ORAL_TABLET | Freq: Every day | ORAL | Status: DC
  Administered 2020-05-11 – 2020-05-15 (×5): 100 mg via ORAL
  Filled 2020-05-10 (×5): qty 1

## 2020-05-10 NOTE — Progress Notes (Signed)
STROKE TEAM PROGRESS NOTE   INTERVAL HISTORY Patient is sitting up in bed. She continues to have expressive aphasia. Neuro exam unchanged. Vital signs stable.  OBJECTIVE Vitals:   05/09/20 1939 05/09/20 2340 05/10/20 0302 05/10/20 0738  BP: 124/68 131/67 112/60 139/77  Pulse: 78 81 74 81  Resp: 20 20 17 20   Temp: 98 F (36.7 C) 97.8 F (36.6 C) 98 F (36.7 C) 97.6 F (36.4 C)  TempSrc: Oral Oral Oral Oral  SpO2: 97% 94% 96% 95%  Weight:      Height:       CBC:  Recent Labs  Lab 05/04/20 0317 05/05/20 0051 05/08/20 0110 05/09/20 0353  WBC 19.2*   < > 13.1* 13.8*  NEUTROABS 17.5*  --   --   --   HGB 10.9*   < > 10.5* 10.0*  HCT 37.4   < > 34.6* 33.9*  MCV 82.4   < > 76.2* 78.3*  PLT 407*   < > 405* 349   < > = values in this interval not displayed.   Basic Metabolic Panel:  Recent Labs  Lab 05/04/20 0442 05/05/20 0051 05/05/20 0456 05/06/20 0452 05/08/20 0110 05/09/20 0353  NA  --    < >  --    < > 132* 135  K  --    < >  --    < > 3.6 3.6  CL  --    < >  --    < > 104 109  CO2  --    < >  --    < > 19* 15*  GLUCOSE  --    < >  --    < > 123* 115*  BUN  --    < >  --    < > 20 18  CREATININE  --    < >  --    < > 0.82 0.82  CALCIUM  --    < >  --    < > 8.7* 8.8*  MG 1.7  --  2.0  --   --   --    < > = values in this interval not displayed.   Lipid Panel:     Component Value Date/Time   CHOL 173 05/04/2020 0317   CHOL 226 (H) 09/16/2019 1136   CHOL 173 01/06/2013 1354   TRIG 65 05/04/2020 0317   TRIG 120 05/06/2013 1341   TRIG 95 01/06/2013 1354   HDL 36 (L) 05/04/2020 0317   HDL 43 09/16/2019 1136   HDL 47 05/06/2013 1341   HDL 41 01/06/2013 1354   CHOLHDL 4.8 05/04/2020 0317   VLDL 13 05/04/2020 0317   LDLCALC 124 (H) 05/04/2020 0317   LDLCALC 158 (H) 09/16/2019 1136   LDLCALC 109 (H) 05/06/2013 1341   LDLCALC 113 (H) 01/06/2013 1354   HgbA1c:  Lab Results  Component Value Date   HGBA1C 6.4 (H) 05/04/2020   Urine Drug Screen: No results  found for: LABOPIA, COCAINSCRNUR, LABBENZ, AMPHETMU, THCU, LABBARB  Alcohol Level     Component Value Date/Time   ETH <10 05/03/2020 1317    IMAGING  CT HEAD CODE STROKE WO CONTRAST CT Angio Head W or Wo Contrast CT Angio Neck W and/or Wo Contrast 05/03/2020    No acute intracranial hemorrhage. No acute infarction identified.   ASPECT score is 10.   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   No hemodynamically significant stenosis the  neck.   Partially imaged mild interstitial pulmonary edema and small bilateral pleural effusions.  CT CEREBRAL PERFUSION W CONTRAST 05/03/2020 No core infarction identified. 67 mL of penumbra in the posterior left MCA territory.  Neuro Interventional Radiology - Cerebral Angiogram with Intervention - Dr Estanislado Pandy 05/03/2020 4:55 PM RT common carotid arteriogram followed by revascularization of occluded sup division M2 branch with x 2 passes with Contact aspiration using Zoom 35 and 55 aspiration catheters and partially of  the distal M3 region of inf division with mechanical thrombolysis achieving a TICI 2C revascularization. Recanalizing Rt ACA A2/A3 junction filling defect noted .  MR BRAIN WO CONTRAST 05/03/2020 1. 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 Associated minimal petechial hemorrhage at the left temporal region without significant mass effect.  2. Underlying age-related cerebral atrophy with moderate chronic microvascular ischemic disease.   MR ANGIO HEAD WO CONTRAST 05/03/2020 1. Technically limited exam due to motion artifact.  2. Previously identified occluded left M2 branch appears grossly patent status post revascularization.  3. Otherwise grossly stable and negative intracranial MRA. No other large vessel occlusion. No other hemodynamically significant or correctable stenosis.   DG CHEST PORT 1 VIEW 05/03/2020 Vascular  congestion with mild parenchymal edema on the right consistent with congestive failure.  Transthoracic Echocardiogram  1. Left ventricular ejection fraction, by estimation, is 55 to 60%. The left ventricle has normal function. The left ventricle has no regional wall motion abnormalities. There is moderate concentric left ventricular hypertrophy. Left ventricular diastolic function could not be evaluated.  2. Right ventricular systolic function is mildly reduced. The right ventricular size is moderately enlarged. There is moderately elevated pulmonary artery systolic pressure. The estimated right ventricular systolic pressure is 23.5 mmHg.  3. Left atrial size was moderately dilated.  4. Right atrial size was severely dilated.  5. A small pericardial effusion is present. The pericardial effusion is posterior to the left ventricle. Large pleural effusion in the left lateral region.  6. The mitral valve is normal in structure. Moderate mitral valve regurgitation. No evidence of mitral stenosis.  7. Tricuspid valve regurgitation is moderate to severe.  8. The aortic valve is normal in structure. Aortic valve regurgitation is trivial. No aortic stenosis is present.  9. The inferior vena cava is normal in size with greater than 50% respiratory variability, suggesting right atrial pressure of 3 mmHg.   ECG - atrial fibrillation - ventricular response 131 BPM (See cardiology reading for complete details)  EEG - This study is suggestive of cortical dysfunction in left hemisphere, maximal left temporal region consistent with underlying stroke.  No seizures or definite epileptiform discharges were seen throughout the recording.   PHYSICAL EXAM       Temp:  [97.6 F (36.4 C)-98 F (36.7 C)] 97.6 F (36.4 C) (11/10 0738) Pulse Rate:  [70-81] 81 (11/10 0738) Resp:  [16-21] 20 (11/10 0738) BP: (112-139)/(60-77) 139/77 (11/10 0738) SpO2:  [94 %-100 %] 95 % (11/10 0738)  General - Well  nourished, well developed, in no apparent distress, lethargic.  Ophthalmologic - fundi not visualized due to noncooperation.  Neuro - awake, alert, expressive aphasia but no dysarthria,  able to follow most simple verbal command can repeat 3 words, cannot name when holding up thumb. Left gaze preference, but able to gaze bilaterally and tracks me across the room. Inconsistently blinking to visual threat right but consistent on the left . PERRL, no significant facial droop. Tongue protrusion can  stick tongue out with verbal command is midline. LUE strong and active, no drift. LLE brisk movement 3/5 on pain. RUE 1/5 . RLE mild withdraw to pain and can wiggle toes, Sensation, coordination not cooperative and gait not tested.    ASSESSMENT/PLAN Ms. WAUNETTA RIGGLE is a 79 y.o. 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 She did not receive IV t-PA due to anticoagulation with Xarelto. IR - Cerebral Angiogram with Intervention - 05/03/20 - revascularization of occluded sup division M2 branch.  Stroke: Lt MCA scattered infarcts due to left M2 occlusion s/p [id number] - embolic - likely from Afib even on Xarelto.  CT Head - No acute intracranial hemorrhage. No acute infarction identified. ASPECT score is 10.      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 No hemodynamically significant stenosis the neck.   CT Perfusion - No core infarction identified. 67 mL of penumbra in the posterior left MCA territory.  MRI head x 2 - Patchy acute ischemic infarcts involving the MCA and ACA distributions, predominantly cortical in nature.    MRA head x 2 -  Previously identified occluded left M2 branch appears grossly patent status post  revascularization.   EEG - left cortical dysfunction, no seizure  2D Echo - EF 55-60%  [employer] Virus 2 - negative  LDL - 124  HgbA1c - 6.4  VTE prophylaxis - [place] Heparin  Xarelto (rivaroxaban) daily prior to admission, now on Eliquis due to Xarelto failure.    Therapy recommendations:  CIR  Disposition:  Pending  Afib RVR  On Xarelto PTA  Have switched Xarelto to Eliquis on day 5 following her stroke   rate difficult to control - Cardiology consulted  Treated w/ amio gtt, med adjustements  Now on metoprolol and Cardizem 90 qid->60 qid->60 tid - rate control improving   Change metoprolol to 100XL per cardiology recommendations  Elevated troponin  Trop 68->63->73  Cardiology on board  Considering related to Afib instead of ischemia  Hypertension->Hypotension  Home BP meds: Toprol ; Zestril  Current BP meds: metoprolol 50 bid and diltiazem 60 tid    Change metoprolol to 100XL per cardiology recommendations  Blood pressure stable  . Long-term BP goal normotensive  Hyperlipidemia  Home Lipid lowering medication: none   LDL 124, goal < 70  Put on Zetia  Hx of statin intolerance (Lipitor ; Pravastatin ; Crestor)  Diabetes  Home diabetic meds: metformin  Current diabetic meds: SSI   HgbA1c 6.4, goal < 7.0  CBG monitoring  PCP follow up  Other Stroke Risk Factors  Advanced age  Obesity, recommend weight loss, diet and exercise as appropriate   Hx stroke/TIA - left thalamic stroke (no details)  Other Active Problems  Leukocytosis - WBC's -  13.4->15.6->19.2->16.2->11.1->8.7->13.1 (afebrile)   Hyponatremia - Na - 129 ->130->132 continue to follow  Hospital day # 7  Continue ongoing therapies. Await transfer to inpatient rehab when bed available. Medically stable for transfer to rehab. Antony Contras, MD  To contact Stroke Continuity provider, please refer to [email or web address] After hours, contact General Neurology

## 2020-05-10 NOTE — H&P (Signed)
Physical Medicine and Rehabilitation Admission H&P    Chief Complaint  Patient presents with  . 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  Per chart review lives alone reportedly independent prior to admission and driving.  1 level home 3 steps to entry.  Presented 05/03/2020 with right side weakness facial droop expressive aphasia and altered mental status.  Her heart rate was noted to be in the 140s.  Cranial CT scan showed no acute intracranial hemorrhage no acute infarction.  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  No hemodynamically significant stenosis in the neck.  Patient underwent revascularization of occluded superior division M2 branch per interventional radiology.  MRI of the brain showed cortical restricted diffusion within the left temporal occipital region.  Scattered foci of restricted diffusion also seen in the left frontal parietal and insular regions of the left amygdala.  MRA with no proximal occlusion or stenosis.  EEG negative for seizure.  Echocardiogram with ejection fraction of 55 to 60% no wall motion abnormalities.  Neurology follow-up as well as cardiology services her Xarelto was changed to Eliquis.  She did remain on Cardizem as well as metoprolol for her atrial fibrillation.  Tolerating a regular diet.  Patient with noted leukocytosis 19,400-24,100 05/13/2020 as well as BUN 29 and creatinine 1.92.  Lactic acid within normal limits.  Renal ultrasound without hydronephrosis.  Chest x-ray showed persistent but improving CHF superimposed infection cannot be ruled out.  Urinalysis negative nitrite few bacteria.  Leukocytosis felt likely from underlying left lower extremity cellulitis and placed on Augmentin  05/15/2020 with WBC improving to 16,000.  Therapy evaluations completed and patient was admitted for a comprehensive rehab program.  Review of Systems  Constitutional: Positive for malaise/fatigue. Negative for chills and fever.  HENT: Negative for hearing loss.   Eyes: Negative for blurred vision and double vision.  Respiratory: Negative for cough and shortness of breath.   Cardiovascular: Positive for palpitations and leg swelling.  Gastrointestinal: Positive for constipation. Negative for heartburn, nausea and vomiting.  Genitourinary: Negative for dysuria, flank pain and hematuria.  Musculoskeletal: Positive for myalgias.  Skin: Negative for rash.  Neurological: Positive for speech change and weakness.  All other systems reviewed and are negative.  Past Medical History:  Diagnosis Date  . Arthritis   . Atrial fibrillation ([place])   . Complication of anesthesia   . Diabetes mellitus without complication ([place])   . Diverticulosis   . Fatigue   . Gastric polyp   . Goiter   . Hyperlipidemia   . Hypertension   . Hypothyroid    taken off of thyroid medication 2012014   . Obese   . Osteopenia   . PONV (postoperative nausea and vomiting)   . Postmenopausal   . Rosacea   . Stroke ([place]) 01/15/2013   left thalamic  stroke, small vessel  . Vitamin D deficiency    Past Surgical History:  Procedure Laterality Date  . ABDOMINAL HYSTERECTOMY  1985  . FOOT SURGERY Right 08/2002  . IR CT HEAD LTD  05/03/2020  . IR PERCUTANEOUS ART THROMBECTOMY/INFUSION INTRACRANIAL INC DIAG ANGIO  05/03/2020  . RADIOLOGY WITH ANESTHESIA N/A 05/03/2020   Procedure: IR WITH ANESTHESIA;  Surgeon: Radiologist, Medication, MD;  Location: [facility];  Service: Radiology;  Laterality: N/A;  .  right knee arthroscopy   2013   . TONSILLECTOMY    . 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   Procedure: RIGHT TOTAL KNEE ARTHROPLASTY;  Surgeon: Gearlean Alf, MD;  Location: [facility];  Service: Orthopedics;  Laterality: Right;    Family History  Problem Relation Age of Onset  . Osteoporosis Mother   . Alzheimer's disease Mother 14  . Arthritis Mother   . Cancer Father        Lung  . Arthritis Sister   . Obesity Sister   . Heart attack Brother 26  . Heart disease Son   . Arthritis Brother   . Arthritis Sister   . Cancer Sister        breast cancer   Social History:  reports that she has never smoked. She has never used smokeless tobacco. She reports that she does not drink alcohol and does not use drugs. Allergies:  Allergies  Allergen Reactions  . Crestor [Rosuvastatin] Other (See Comments)    Cramps  . Lipitor [Atorvastatin] Other (See Comments)    Cramps  . Pravastatin Other (See Comments)    Cramps   . Keflex [Cephalexin] Hives   Medications Prior to Admission  Medication Sig Dispense Refill  . alendronate (FOSAMAX) 70 MG tablet TAKE 1 TABLET BY MOUTH  WEEKLY AS DIRECTED (Patient taking differently: Take 70 mg by mouth once a week. ) 12 tablet 3  . Cyanocobalamin (VITAMIN B12) 1000 MCG TBCR Take 1,000 mcg by mouth daily.     Marland Kitchen lisinopril (ZESTRIL) 40 MG tablet TAKE 1 TABLET BY MOUTH  DAILY (Patient taking differently: Take 40 mg by mouth daily. ) 90 tablet 3  . Magnesium Oxide (MAG-OX 400 PO) Take 400 mg by mouth daily.    . metFORMIN (GLUCOPHAGE) 1000 MG tablet Take 0.5 tablets (500 mg total) by mouth daily with breakfast. 90 tablet 0  . metoprolol succinate (TOPROL-XL) 50 MG 24 hr tablet TAKE 1 TABLET BY MOUTH  DAILY WITH OR IMMEDIATELY  FOLLOWING A MEAL (Patient taking differently: Take 37.5 mg by mouth in the morning and at bedtime. ) 90 tablet 0  . XARELTO 20 MG TABS tablet TAKE 1 TABLET BY MOUTH  DAILY (Patient taking differently: Take 20 mg by mouth daily with supper. ) 90 tablet 0  . diltiazem (CARDIZEM CD) 240 MG 24 hr capsule TAKE 1 CAPSULE BY MOUTH  DAILY (Patient taking differently: Take 240 mg by mouth daily. ) 90 capsule 3  . glucose blood (ONE TOUCH ULTRA TEST) test strip USE TO  CHECK BLOOD GLUCOSE  ONCE A DAY AS INSTRUCTED 100 each 6  . ONETOUCH DELICA LANCETS 82N MISC 1 each by Does not apply route daily. Use to check BG daily 100 each 2    Drug Regimen Review Drug regimen was reviewed and remains appropriate with no significant issues identified  Home: Home Living Family/patient expects to be discharged to:: Private residence Living Arrangements: Alone Available Help at Discharge: Family, Available PRN/intermittently Type of Home: House Home Access: Stairs to enter Technical brewer of Steps: 3 Entrance Stairs-Rails: Right, Left Home Layout: One level Bathroom Shower/Tub: Multimedia programmer: Standard Bathroom Accessibility: Yes Home Equipment: None Additional Comments: per chart review  Lives With: Alone   Functional History: Prior Function Level of Independence: Independent Comments: drove, I in ADL's and iADL's  Functional Status:  Mobility: Bed Mobility Overal bed mobility: Needs Assistance Bed Mobility: Supine to Sit Rolling: Mod assist Supine to sit: Mod assist, +2 for physical  assistance, HOB elevated Sit to supine: Total assist, +2 for physical assistance Sit to sidelying: Total assist, +2 for physical assistance, +2 for safety/equipment General bed mobility comments: pt with use of rail, PT/OT initiation of roll to allow pt to reach railing with LUE. PT assists some with rotation of hips with pad Transfers Overall transfer level: Needs assistance Equipment used: 2 person hand held assist Transfers: Sit to/from Stand, Stand Pivot Transfers Sit to Stand: Mod assist, +2 physical assistance Stand pivot transfers: Mod assist, +2 physical assistance General transfer comment: pt completes 3 sit to stands, 2 with 2 person hand hold and modA x2, one with PT hand hold and R knee block. PT requires initiation of turning for SPT as well as assistance to mobilize RLE during transfer Ambulation/Gait General Gait Details: unable      ADL: ADL Overall ADL's : Needs assistance/impaired Eating/Feeding: Moderate assistance, Sitting Eating/Feeding Details (indicate cue type and reason): pt attending to the L side of the tray. pt more fixated on liquids. pt drinking from the milk and juice at the same time with straws Grooming: Wash/dry face, Bed level, Moderate assistance Grooming Details (indicate cue type and reason): pt initiates and sustains task to bil side of  face Upper Body Bathing: Maximal assistance, Sitting Lower Body Bathing: Total assistance, +2 for physical assistance, +2 for safety/equipment, Sit to/from stand Upper Body Dressing : Total assistance, Sitting Lower Body Dressing: Total assistance Toilet Transfer: +2 for physical assistance, Moderate assistance Toilet Transfer Details (indicate cue type and reason): simulated EOB to chair -- incontinence of bladder Functional mobility during ADLs: +2 for physical assistance, Total assistance, +2 for safety/equipment General ADL Comments: pt limited by impaired cognition, anxiety, weakness, impaired commuincation   Cognition: Cognition Overall Cognitive Status: Impaired/Different from baseline Orientation Level: Oriented to place, Disoriented to place, Disoriented to time, Disoriented to situation Cognition Arousal/Alertness: Awake/alert Behavior During Therapy: Flat affect Overall Cognitive Status: Impaired/Different from baseline Area of Impairment: Attention, Memory, Following commands, Safety/judgement, Awareness, Problem solving Current Attention Level: Sustained Memory: Decreased recall of precautions, Decreased short-term memory Following Commands: Follows one step commands with increased time (with left side, less consistent with R side) Safety/Judgement: Decreased awareness of safety, Decreased awareness of deficits Awareness: Intellectual Problem Solving: Slow processing, Requires verbal cues, Requires tactile cues, Difficulty sequencing,  Decreased initiation General Comments: pt answers yes to most questions initially but when given the command to say "no" pt was able to do so. pt able to verbalize flowers are from daughter in law. pt able to express trouble speaking. pt able to follow 1 step commands. pt attempting to self feed with L UE Difficult to assess due to: Impaired communication  Physical Exam: Blood pressure 119/74, pulse 75, temperature 97.7 F (36.5 C), temperature source Oral, resp. rate 18, height 5' 4.5" (1.638 m), weight 79.4 kg, SpO2 98 %. Physical Exam General: Pleasantly confused, No apparent distress HEENT: Head is normocephalic, atraumatic, PERRLA, EOMI, sclera anicteric, oral mucosa pink and moist, dentition intact, ext ear canals clear,  Neck: Supple without JVD or lymphadenopathy Heart: Irregularly irregular. No murmurs rubs or gallops Chest: CTA bilaterally without wheezes, rales, or rhonchi; no distress Abdomen: Soft, non-tender, non-distended, bowel sounds positive. Extremities: No clubbing, cyanosis, or edema. Pulses are 2+ Skin: Clean and intact without signs of breakdown Neuro: Patient is alert.  Exhibits a mild left gaze preference. Difficulty following commands but generally LUE 5/5, RUE: 3/5, LLE 3/5, not moving RLE. Patient is aphasic.  She can speak at  a simple phrase level.  Psych: Pt's affect is confused. Pt is cooperative  Results for orders placed or performed during the hospital encounter of 05/03/20 (from the past 48 hour(s))  Glucose, capillary     Status: Abnormal   Collection Time: 05/08/20  4:53 PM  Result Value Ref Range   Glucose-Capillary 123 (H) 70 - 99 mg/dL    Comment: Glucose reference range applies only to samples taken after fasting for at least 8 hours.   Comment 1 Notify RN    Comment 2 Document in Chart   Glucose, capillary     Status: Abnormal   Collection Time: 05/08/20  9:10 PM  Result Value Ref Range   Glucose-Capillary 138 (H) 70 - 99 mg/dL    Comment:  Glucose reference range applies only to samples taken after fasting for at least 8 hours.   Comment 1 Notify RN    Comment 2 Document in Chart   CBC     Status: Abnormal   Collection Time: 05/09/20  3:53 AM  Result Value Ref Range   WBC 13.8 (H) 4.0 - 10.5 K/uL   RBC 4.33 3.87 - 5.11 MIL/uL   Hemoglobin 10.0 (L) 12.0 - 15.0 g/dL   HCT 33.9 (L) 36 - 46 %   MCV 78.3 (L) 80.0 - 100.0 fL   MCH 23.1 (L) 26.0 - 34.0 pg   MCHC 29.5 (L) 30.0 - 36.0 g/dL   RDW 16.8 (H) 11.5 - 15.5 %   Platelets 349 150 - 400 K/uL   nRBC 0.0 0.0 - 0.2 %    Comment: Performed at [facility], [place] [address] [place], [place] [address]  Basic metabolic panel     Status: Abnormal   Collection Time: 05/09/20  3:53 AM  Result Value Ref Range   Sodium 135 135 - 145 mmol/L   Potassium 3.6 3.5 - 5.1 mmol/L   Chloride 109 98 - 111 mmol/L   CO2 15 (L) 22 - 32 mmol/L   Glucose, Bld 115 (H) 70 - 99 mg/dL    Comment: Glucose reference range applies only to samples taken after fasting for at least 8 hours.   BUN 18 8 - 23 mg/dL   Creatinine, Ser 0.82 0.44 - 1.00 mg/dL   Calcium 8.8 (L) 8.9 - 10.3 mg/dL   GFR, Estimated >60 >60 mL/min    Comment: (NOTE) Calculated using the CKD-EPI Creatinine Equation (2021)    Anion gap 11 5 - 15    Comment: Performed at [facility] [address], [place], [place] [address]  Glucose, capillary     Status: Abnormal   Collection Time: 05/09/20  6:19 AM  Result Value Ref Range   Glucose-Capillary 122 (H) 70 - 99 mg/dL    Comment: Glucose reference range applies only to samples taken after fasting for at least 8 hours.   Comment 1 Notify RN    Comment 2 Document in Chart   Glucose, capillary     Status: Abnormal   Collection Time: 05/09/20 12:22 PM  Result Value Ref Range   Glucose-Capillary 122 (H) 70 - 99 mg/dL    Comment: Glucose reference range applies only to samples taken after fasting for at least 8 hours.   Comment 1 Notify RN    Comment 2 Document in Chart    Glucose, capillary     Status: Abnormal   Collection Time: 05/09/20  5:06 PM  Result Value Ref Range   Glucose-Capillary 133 (H)  70 - 99 mg/dL    Comment: Glucose reference range applies only to samples taken after fasting for at least 8 hours.   Comment 1 Notify RN    Comment 2 Document in Chart   Glucose, capillary     Status: Abnormal   Collection Time: 05/09/20  9:07 PM  Result Value Ref Range   Glucose-Capillary 134 (H) 70 - 99 mg/dL    Comment: Glucose reference range applies only to samples taken after fasting for at least 8 hours.  Glucose, capillary     Status: Abnormal   Collection Time: 05/10/20  6:22 AM  Result Value Ref Range   Glucose-Capillary 152 (H) 70 - 99 mg/dL    Comment: Glucose reference range applies only to samples taken after fasting for at least 8 hours.   Comment 1 Notify RN    Comment 2 Document in Chart   Glucose, capillary     Status: Abnormal   Collection Time: 05/10/20  7:47 AM  Result Value Ref Range   Glucose-Capillary 171 (H) 70 - 99 mg/dL    Comment: Glucose reference range applies only to samples taken after fasting for at least 8 hours.   Comment 1 Notify RN    Comment 2 Document in Chart   Glucose, capillary     Status: Abnormal   Collection Time: 05/10/20 12:46 PM  Result Value Ref Range   Glucose-Capillary 164 (H) 70 - 99 mg/dL    Comment: Glucose reference range applies only to samples taken after fasting for at least 8 hours.   Comment 1 Notify RN    Comment 2 Document in Chart    No results found.     Medical Problem List and Plan: 1.  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  Antithrombotics: -DVT/anticoagulation: Bilateral lower extremity Dopplers negative for DVT.  Eliquis  -antiplatelet  therapy: N/A 3. Pain Management: Tylenol as needed. Complains of neck discomfort: will order kpad 4. Mood: Provide emotional support  -antipsychotic agents: N/A 5. Neuropsych: This patient is capable of making decisions on her own behalf. 6. Skin/Wound Care: Routine skin checks 7. Fluids/Electrolytes/Nutrition: Routine in and outs with follow-up chemistries 8.  Atrial fibrillation.  Cardizem 60 mg every 8 hours, Toprol-XL 100 mg daily.  Cardiac rate controlled to 80-90 on 11/15 9.  Diabetes mellitus.  Hemoglobin A1c 6.4.  SSI.  Patient on Glucophage 500 mg daily prior to admission.  Resume as needed 10.Hypertension.  Monitor with increased mobility.  Patient on lisinopril 40 mg daily prior to admission.  Resume as needed. Currently BP is soft.  11.  Hyperlipidemia.  Zetia 12.  Left lower extremity cellulitis.  Venous Doppler studies negative.  Complete course of Augmentin initiated 05/15/2020.  Lavon Paganini Angiulli, PA-C 05/10/2020   I have personally performed a face to face diagnostic evaluation, including, but not limited to relevant history and physical exam findings, of this patient and developed relevant assessment and plan.  Additionally, I have reviewed and concur with the physician assistant's documentation above.  Leeroy Cha, MD

## 2020-05-10 NOTE — Progress Notes (Signed)
Inpatient Rehabilitation Admissions Coordinator  Updated therapy progress noted and I will begin insurance Auth today for possible [facility] admit.  Danne Baxter, RN, MSN Rehab Admissions Coordinator [phone] 05/10/2020 10:18 AM

## 2020-05-10 NOTE — Progress Notes (Signed)
Physical Therapy Treatment Patient Details Name: ANALLELY ROSELL MRN: [id number] DOB: 01-23-1941 Today's Date: 05/10/2020    History of Present Illness The pt is a 79 yo female presenting with R-sided facial droop, mild dysarthria, confusion and rapid A fib. 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  Imaging showing patchy acute ischemic infarcts involving the L MCA and ACA distributions.    PT Comments    Pt tolerates treatment well, more alert and with much better participation this session. Pt initiates for most mobility tasks when cued and requires much less physical assistance this session. Pt does demonstrate impaired attention to R side during session, requiring max cues to look at R side during session. Pt does continue to require physical assistance for all mobility, but is able to complete sit to stand transfers with one person assist. Pt will benefit from continued acute PT POC to improve mobility quality and to reduce falls risk. PT recommends [facility] admission at the time of discharge as the pt demonstrates good participation and has the potential to make significant functional gains with high intensity inpatient PT services.   Follow Up Recommendations  [facility]     Equipment Recommendations  Wheelchair (measurements PT);Hospital bed (if home today)    Recommendations for Other Services       Precautions / Restrictions Precautions Precautions: Fall Restrictions Weight Bearing Restrictions: No    Mobility  Bed Mobility Overal bed mobility: Needs Assistance Bed Mobility: Supine to Sit Rolling: Mod assist   Supine to sit: Mod assist;+2 for physical assistance;HOB elevated     General bed mobility comments: pt with use of rail, PT/OT initiation of roll to allow pt to reach railing with LUE. PT assists some with rotation of hips with pad  Transfers Overall transfer level: Needs assistance Equipment used: 2 person hand held  assist Transfers: Sit to/from [employer] Transfers Sit to Stand: Mod assist;+2 physical assistance Stand pivot transfers: Mod assist;+2 physical assistance       General transfer comment: pt completes 3 sit to stands, 2 with 2 person hand hold and modA x2, one with PT hand hold and R knee block. PT requires initiation of turning for SPT as well as assistance to mobilize RLE during transfer  Ambulation/Gait                 Stairs             Wheelchair Mobility    Modified Rankin (Stroke Patients Only) Modified Rankin (Stroke Patients Only) Pre-Morbid Rankin Score: No symptoms Modified Rankin: Severe disability     Balance Overall balance assessment: Needs assistance Sitting-balance support: No upper extremity supported;Feet supported Sitting balance-Leahy Scale: Poor Sitting balance - Comments: reliant on UE support of minA to maintain sitting balance, R lean initially Postural control: Right lateral lean   Standing balance-Leahy Scale: Poor Standing balance comment: modA with BUE support                            Cognition Arousal/Alertness: Awake/alert Behavior During Therapy: Flat affect Overall Cognitive Status: Impaired/Different from baseline Area of Impairment: Attention;Memory;Following commands;Safety/judgement;Awareness;Problem solving                   Current Attention Level: Sustained Memory: Decreased recall of precautions;Decreased short-term memory Following Commands: Follows one step commands with increased time (with left side, less consistent with R side) Safety/Judgement: Decreased awareness of safety;Decreased awareness  of deficits Awareness: Intellectual Problem Solving: Slow processing;Requires verbal cues;Requires tactile cues;Difficulty sequencing;Decreased initiation        Exercises      General Comments General comments (skin integrity, edema, etc.): pt on 2L [place] initially, weaned to room air with  sats ranging from 93-95% with activity. Pt demonstrates poor attention to R side throughout session      Pertinent Vitals/Pain Pain Assessment: Faces Faces Pain Scale: No hurt    Home Living                      Prior Function            PT Goals (current goals can now be found in the care plan section) Acute Rehab PT Goals Patient Stated Goal: "I want to go home." Progress towards PT goals: Progressing toward goals    Frequency    Min 4X/week      PT Plan Frequency needs to be updated    Co-evaluation PT/OT/SLP Co-Evaluation/Treatment: Yes Reason for Co-Treatment: Complexity of the patient's impairments (multi-system involvement);Necessary to address cognition/behavior during functional activity;For patient/therapist safety;To address functional/ADL transfers PT goals addressed during session: Mobility/safety with mobility;Balance;Strengthening/ROM        AM-PAC PT "6 Clicks" Mobility   Outcome Measure  Help needed turning from your back to your side while in a flat bed without using bedrails?: A Lot Help needed moving from lying on your back to sitting on the side of a flat bed without using bedrails?: A Lot Help needed moving to and from a bed to a chair (including a wheelchair)?: A Lot Help needed standing up from a chair using your arms (e.g., wheelchair or bedside chair)?: A Lot Help needed to walk in hospital room?: Total Help needed climbing 3-5 steps with a railing? : Total 6 Click Score: 10    End of Session Equipment Utilized During Treatment: Gait belt Activity Tolerance: Patient tolerated treatment well Patient left: in chair;with call bell/phone within reach;with chair alarm set Nurse Communication: Mobility status;Need for lift equipment (STEDY) PT Visit Diagnosis: Hemiplegia and hemiparesis;Muscle weakness (generalized) (M62.81);Other abnormalities of gait and mobility (R26.89) Hemiplegia - Right/Left: Right Hemiplegia -  dominant/non-dominant: Dominant Hemiplegia - caused by: Cerebral infarction     Time: 9518-8416 PT Time Calculation (min) (ACUTE ONLY): 31 min  Charges:  $Therapeutic Activity: 8-22 mins                     Zenaida Niece, PT, DPT Acute Rehabilitation Pager: [phone]    Zenaida Niece 05/10/2020, 10:14 AM

## 2020-05-10 NOTE — Progress Notes (Signed)
Occupational Therapy Treatment Patient Details Name: Sheila Ray MRN: [id number] DOB: [birth year] Today's Date: 05/10/2020    History of present illness The pt is a 79 yo female presenting with R-sided facial droop, mild dysarthria, confusion and rapid A fib. 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  Imaging showing patchy acute ischemic infarcts involving the L MCA and ACA distributions.   OT comments  Pt noted to have incontinence of bowel and bladder without awareness. Pt transferred OOB to chair this session with R side attention deficits apparent. Pt does have activation in R UE / LE with poor proprioception. Recommendation remains for CIR .    Follow Up Recommendations  CIR;Supervision/Assistance - 24 hour    Equipment Recommendations  3 in 1 bedside commode    Recommendations for Other Services Rehab consult    Precautions / Restrictions Precautions Precautions: Fall Restrictions Weight Bearing Restrictions: No       Mobility Bed Mobility Overal bed mobility: Needs Assistance Bed Mobility: Supine to Sit Rolling: Mod assist   Supine to sit: Mod assist;+2 for physical assistance;HOB elevated     General bed mobility comments: pt with use of rail, PT/OT initiation of roll to allow pt to reach railing with LUE. PT assists some with rotation of hips with pad  Transfers Overall transfer level: Needs assistance Equipment used: 2 person hand held assist Transfers: Sit to/from [employer] Transfers Sit to Stand: Mod assist;+2 physical assistance Stand pivot transfers: Mod assist;+2 physical assistance       General transfer comment: pt completes 3 sit to stands, 2 with 2 person hand hold and modA x2, one with PT hand hold and R knee block. PT requires initiation of turning for SPT as well as assistance to mobilize RLE during transfer    Balance Overall balance assessment: Needs assistance Sitting-balance support: No  upper extremity supported;Feet supported Sitting balance-Leahy Scale: Poor Sitting balance - Comments: reliant on UE support of minA to maintain sitting balance, R lean initially Postural control: Right lateral lean   Standing balance-Leahy Scale: Poor Standing balance comment: modA with BUE support                           ADL either performed or assessed with clinical judgement   ADL Overall ADL's : Needs assistance/impaired Eating/Feeding: Moderate assistance;Sitting Eating/Feeding Details (indicate cue type and reason): pt attending to the L side of the tray. pt more fixated on liquids. pt drinking from the milk and juice at the same time with straws Grooming: Wash/dry face;Bed level;Moderate assistance Grooming Details (indicate cue type and reason): pt initiates and sustains task to bil side of  face             Lower Body Dressing: Total assistance   Toilet Transfer: +2 for physical assistance;Moderate assistance Toilet Transfer Details (indicate cue type and reason): simulated EOB to chair -- incontinence of bladder                 Vision       Perception     Praxis      Cognition Arousal/Alertness: Awake/alert Behavior During Therapy: Flat affect Overall Cognitive Status: Impaired/Different from baseline Area of Impairment: Attention;Memory;Following commands;Safety/judgement;Awareness;Problem solving                   Current Attention Level: Sustained Memory: Decreased recall of precautions;Decreased short-term memory Following Commands: Follows one step commands with  increased time (with left side, less consistent with R side) Safety/Judgement: Decreased awareness of safety;Decreased awareness of deficits Awareness: Intellectual Problem Solving: Slow processing;Requires verbal cues;Requires tactile cues;Difficulty sequencing;Decreased initiation General Comments: pt answers yes to most questions initially but when given the command  to say "no" pt was able to do so. pt able to verbalize flowers are from daughter in law. pt able to express trouble speaking. pt able to follow 1 step commands. pt attempting to self feed with L UE        Exercises Other Exercises Other Exercises: attempts throughout session for R attention to body and room Other Exercises: pt noted to have visual deficits states "i need my glasses" no glasses present   Shoulder Instructions       General Comments 2 L [place] but completed session RA with 93-95% sats. P    Pertinent Vitals/ Pain       Pain Assessment: Faces Faces Pain Scale: No hurt  Home Living                                          Prior Functioning/Environment              Frequency  Min 2X/week        Progress Toward Goals  OT Goals(current goals can now be found in the care plan section)  Progress towards OT goals: Progressing toward goals  Acute Rehab OT Goals Patient Stated Goal: to drink Time For Goal Achievement: 05/22/20 Potential to Achieve Goals: Good ADL Goals Pt Will Perform Grooming: with min assist;sitting Pt Will Perform Upper Body Bathing: with min assist;sitting Pt Will Transfer to Toilet: bedside commode;with mod assist;with +2 assist;stand pivot transfer Pt/caregiver will Perform Home Exercise Program: Increased strength;Right Upper extremity;With Supervision Additional ADL Goal #1: Patient will follow 1 step commands with 75% accuracy to optimize participation in ADLs. (met) Additional ADL Goal #2: Patient will complete bed mobility with min assist and maintain sitting balance at EOB dynamically with no more than min assist for 5 minutes as precursor to ADLs.  Plan Discharge plan remains appropriate    Co-evaluation    PT/OT/SLP Co-Evaluation/Treatment: Yes Reason for Co-Treatment: Complexity of the patient's impairments (multi-system involvement);For patient/therapist safety;To address functional/ADL transfers PT goals  addressed during session: Mobility/safety with mobility;Balance;Strengthening/ROM OT goals addressed during session: ADL's and self-care;Proper use of Adaptive equipment and DME;Strengthening/ROM      AM-PAC OT "6 Clicks" Daily Activity     Outcome Measure   Help from another person eating meals?: A Little Help from another person taking care of personal grooming?: A Little Help from another person toileting, which includes using toliet, bedpan, or urinal?: A Lot Help from another person bathing (including washing, rinsing, drying)?: A Lot Help from another person to put on and taking off regular upper body clothing?: A Little Help from another person to put on and taking off regular lower body clothing?: A Lot 6 Click Score: 15    End of Session Equipment Utilized During Treatment: Gait belt;Oxygen  OT Visit Diagnosis: Other abnormalities of gait and mobility (R26.89);Muscle weakness (generalized) (M62.81);Pain;Cognitive communication deficit (R41.841);Other symptoms and signs involving cognitive function Symptoms and signs involving cognitive functions: Cerebral infarction   Activity Tolerance Patient tolerated treatment well   Patient Left in chair;with call bell/phone within reach;with chair alarm set   Nurse Communication Mobility status;Precautions  Time: 0930-1000 OT Time Calculation (min): 30 min  Charges: OT General Charges $OT Visit: 1 Visit OT Treatments $Self Care/Home Management : 8-22 mins   Sheila Ray, Sheila Ray  Acute Rehabilitation Services Pager: [phone] Office: [phone] .    Jeri Modena 05/10/2020, 10:33 AM

## 2020-05-10 NOTE — PMR Pre-admission (Addendum)
PMR Admission Coordinator Pre-Admission Assessment  Patient: Sheila Ray is an 79 y.o., female MRN: [id number] DOB: 1941/01/02 Height: 5' 4.5" (163.8 cm) Weight: 79.4 kg              Insurance Information HMO: yes PPO:      PCP:      IPA:      80/20:      OTHER:  PRIMARY: [employer]      Policy#: [id number]      Subscriber: pt CM Name: Charlena Cross     Phone#: [phone] ext [phone]     Fax#: [phone] Pre-Cert#: [id number] approved for 7 days     Employer:  Benefits:  Phone #: [phone]     Name: 11/10 Eff. Date: 03/31/2020     Deduct: none      Out of Pocket Max: $4500 CIR: $325 co pay per day days 1 until 5      SNF: no copay days 1 until 20; $184 co pay per day days 21 until 45; no copay days 46 until 100 Outpatient: $35 per visit     Co-Pay: visits per medical neccesity Home Health: 100%      Co-Pay: visits per medical neccesity DME: 80%     Co-Pay: 20% Providers: in network  SECONDARY: none      Policy#:       Phone#:   Development worker, community:       Phone#:   [employer] Engineer, [employer]" for patients in Inpatient Rehabilitation Facilities with attached "Privacy Act [place] Records" was provided and verbally reviewed with: Family  Emergency Contact Information Contact Information    Name Relation Home Work Mobile   [place] Son [place], [place] Other   [phone]     Current Medical History  Patient Admitting Diagnosis: CVA  History of Present Illness:  79 year old right-handed female with 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    Presented 05/03/2020 with right side weakness facial droop expressive aphasia and altered mental status.  Her heart rate was noted to be in the 140s.  Cranial CT scan showed no acute intracranial hemorrhage no acute infarction.  CT angiogram of head and neck  occlusion of left M2 MCA branch approximate 13 mm from trifurcation.  No hemodynamically significant stenosis in the neck.  Patient underwent revascularization of occluded superior division M2 branch per interventional radiology.  MRI of the brain showed cortical restricted diffusion within the left temporal occipital region.  Scattered foci of restricted diffusion also seen in the left frontal parietal and insular regions of the left amygdala.  MRA with no proximal occlusion or stenosis.  EEG negative for seizure.  Echocardiogram with ejection fraction of 55 to 60% no wall motion abnormalities.  Neurology follow-up as well as cardiology services her Xarelto was changed to Eliquis.  She did remain on Cardizem as well as metoprolol for her atrial fibrillation.  Tolerating a regular diet.  Patient with noted leukocytosis 19,400-24,100 05/13/2020 as well as BUN 29 and creatinine 1.92.  Lactic acid within normal limits.  Renal ultrasound without hydronephrosis.  Chest x-ray showed persistent but improving CHF superimposed infection cannot be ruled out.  Urinalysis negative nitrite few bacteria.  Leukocytosis felt likely from underlying left lower extremity cellulitis and placed on Augmentin 05/15/2020 with WBC improving to 16,000.    Complete NIHSS TOTAL: 10 Glasgow Coma Scale Score: 14  Past Medical History  Past Medical History:  Diagnosis Date  . Arthritis   . Atrial fibrillation ([place])   . Complication of anesthesia   . Diabetes mellitus without complication ([place])   . Diverticulosis   . Fatigue   . Gastric polyp   . Goiter   . Hyperlipidemia   . Hypertension   . Hypothyroid    taken off of thyroid medication 2012014   . Obese   . Osteopenia   . PONV (postoperative nausea and vomiting)   . Postmenopausal   . Rosacea   . Stroke ([place]) 01/15/2013   left thalamic  stroke, small vessel  . Vitamin D deficiency     Family History  family history includes Alzheimer's disease (age of onset: 48) in  her mother; Arthritis in her brother, mother, sister, and sister; Cancer in her father and sister; Heart attack (age of onset: 73) in her brother; Heart disease in her son; Obesity in her sister; Osteoporosis in her mother.  Prior Rehab/Hospitalizations:  Has the patient had prior rehab or hospitalizations prior to admission? Yes  Has the patient had major surgery during 100 days prior to admission? Yes  Current Medications   Current Facility-Administered Medications:  .   stroke: mapping our early stages of recovery book, , Does not apply, Once, Bhagat, Srishti L, MD .  0.9 %  sodium chloride infusion, 250 mL, Intravenous, Continuous, Oletta Darter Virgina Evener, MD, Held at 05/03/20 2315 .  0.9 %  sodium chloride infusion, , Intravenous, Continuous, Darrick Meigs, Marge Duncans, MD, Last Rate: 100 mL/hr at 05/15/20 0222, New Bag at 05/15/20 0222 .  acetaminophen (TYLENOL) tablet 650 mg, 650 mg, Oral, Q4H PRN, 650 mg at 05/09/20 0828 **OR** acetaminophen (TYLENOL) 160 MG/5ML solution 650 mg, 650 mg, Per Tube, Q4H PRN **OR** acetaminophen (TYLENOL) suppository 650 mg, 650 mg, Rectal, Q4H PRN, Bhagat, Srishti L, MD .  amoxicillin-clavulanate (AUGMENTIN) 875-125 MG per tablet 1 tablet, 1 tablet, Oral, Q12H, Rolla Flatten, [facility], 1 tablet at 05/15/20 0912 .  apixaban (ELIQUIS) tablet 5 mg, 5 mg, Oral, BID, Garvin Fila, MD, 5 mg at 05/15/20 0912 .  Chlorhexidine Gluconate Cloth 2 % PADS 6 each, 6 each, Topical, Daily, Rosalin Hawking, MD, 6 each at 05/15/20 0913 .  diltiazem (CARDIZEM) tablet 60 mg, 60 mg, Oral, Q8H, Minus Breeding, MD, 60 mg at 05/15/20 0542 .  ezetimibe (ZETIA) tablet 10 mg, 10 mg, Oral, Daily, Rosalin Hawking, MD, 10 mg at 05/15/20 0912 .  insulin aspart (novoLOG) injection 0-6 Units, 0-6 Units, Subcutaneous, TID AC & HS, Rinehuls, Early Chars, PA-C, 1 Units at 05/10/20 1639 .  ipratropium (ATROVENT) 0.06 % nasal spray 1 spray, 1 spray, Each Nare, BID, Rosalin Hawking, MD, 1 spray at 05/15/20 0914 .  MEDLINE  mouth rinse, 15 mL, Mouth Rinse, BID, Rosalin Hawking, MD, 15 mL at 05/15/20 0914 .  metoprolol succinate (TOPROL-XL) 24 hr tablet 100 mg, 100 mg, Oral, Daily, Biby, Sharon L, NP, 100 mg at 05/15/20 0912 .  metoprolol tartrate (LOPRESSOR) injection 5 mg, 5 mg, Intravenous, Q3H PRN, Anders Simmonds, MD, 5 mg at 05/05/20 1017 .  nystatin (MYCOSTATIN/NYSTOP) topical powder, , Topical, BID, Rosalin Hawking, MD, Given at 05/15/20 0914 .  prochlorperazine (COMPAZINE) injection 10 mg, 10 mg, Intravenous, Q6H PRN, Stoltzfus, Gregory P, DO, 10 mg at 05/06/20 1202 .  senna-docusate (Senokot-S) tablet 1 tablet, 1 tablet, Oral, QHS PRN, Bhagat, Srishti L, MD  Patients Current Diet:  Diet Order  Diet regular Room service appropriate? No; Fluid consistency: Thin  Diet effective now                Precautions / Restrictions Precautions Precautions: Fall Precaution Comments: recommend brief for transfers due to incontinence Restrictions Weight Bearing Restrictions: No   Has the patient had 2 or more falls or a fall with injury in the past year?No  Prior Activity Level Community (5-7x/wk): independent and driving  Prior Functional Level Prior Function Level of Independence: Independent Comments: drove, I in ADL's and iADL's  Self Care: Did the patient need help bathing, dressing, using the toilet or eating?  Independent  Indoor Mobility: Did the patient need assistance with walking from room to room (with or without device)? Independent  Stairs: Did the patient need assistance with internal or external stairs (with or without device)? Independent  Functional Cognition: Did the patient need help planning regular tasks such as shopping or remembering to take medications? Independent  Home Assistive Devices / Equipment Home Assistive Devices/Equipment: None Home Equipment: None  Prior Device Use: Indicate devices/aids used by the patient prior to current illness, exacerbation or injury?  None of the above  Current Functional Level Cognition  Overall Cognitive Status: Impaired/Different from baseline Difficult to assess due to: Impaired communication Current Attention Level: Sustained Orientation Level: Oriented to person Following Commands: Follows one step commands inconsistently, Follows one step commands with increased time Safety/Judgement: Decreased awareness of safety, Decreased awareness of deficits General Comments: Pt presents with aphasia and clearly frustrated by this.  Pt required encouragement from son to participate in PT session this pm.    Extremity Assessment (includes Sensation/Coordination)  Upper Extremity Assessment: RUE deficits/detail RUE Deficits / Details: tone noted in hand able to opena nd close hand on command RUE Sensation: decreased proprioception RUE Coordination: decreased fine motor, decreased gross motor  Lower Extremity Assessment: Defer to PT evaluation RLE Deficits / Details: no voluntary movement, noted associated movement with movement on the L RLE Sensation:  (pt reports similar sensation from L to R side) RLE Coordination: decreased gross motor, decreased fine motor    ADLs  Overall ADL's : Needs assistance/impaired Eating/Feeding: Moderate assistance, Sitting Eating/Feeding Details (indicate cue type and reason): pt attending to the L side of the tray. pt more fixated on liquids. pt drinking from the milk and juice at the same time with straws Grooming: Brushing hair, Wash/dry face, Minimal assistance, Bed level Grooming Details (indicate cue type and reason): requesting comb for hair when upright in the chair Upper Body Bathing: Maximal assistance, Sitting Lower Body Bathing: Total assistance Upper Body Dressing : Total assistance, Sitting Lower Body Dressing: Total assistance Toilet Transfer: +2 for physical assistance, Moderate assistance Toilet Transfer Details (indicate cue type and reason): simulated EOB to chair --  incontinence of bladder Functional mobility during ADLs: +2 for physical assistance, Total assistance, +2 for safety/equipment General ADL Comments: pt incontience on arrival and incontinence with transfer. recommend brief for all transfers.     Mobility  Overal bed mobility: Needs Assistance Bed Mobility: Rolling, Sidelying to Sit Rolling: Max assist Sidelying to sit: Mod assist Supine to sit: Mod assist, +2 for physical assistance, HOB elevated Sit to supine: Total assist, +2 for physical assistance Sit to sidelying: Total assist, +2 for physical assistance, +2 for safety/equipment General bed mobility comments: Max assistance to roll to L side due to strong tone in R leg.  Pt required decreased assistance elevate trunk.  Once in sitting total assistance to scoot  to edge of bed with use of bed pad.    Transfers  Overall transfer level: Needs assistance Equipment used: Ambulation equipment used (sara stedy) Transfers: Sit to/from Stand Sit to Stand: Mod assist Stand pivot transfers: +2 physical assistance, Max assist General transfer comment: Pt required increased time to follow commands during transfer.  Able to reach for sara stedy cross bar to pull into standing.  Pt performed x 3 standing trials with emphasis on hip/trunk/head extension.    Ambulation / Gait / Stairs / Wheelchair Mobility  Ambulation/Gait Ambulation/Gait assistance:  (NT) General Gait Details: unable    Posture / Balance Dynamic Sitting Balance Sitting balance - Comments: reliant on support of R UE and static sitting min to min guard Balance Overall balance assessment: Needs assistance Sitting-balance support: Bilateral upper extremity supported, Feet supported Sitting balance-Leahy Scale: Fair Sitting balance - Comments: reliant on support of R UE and static sitting min to min guard Postural control: Right lateral lean Standing balance support: Bilateral upper extremity supported, During functional  activity Standing balance-Leahy Scale: Poor Standing balance comment: modAx2 and R knee block for static standing with sara stedy    Special needs/care consideration Decreased safety awareness Hgb A1c 6.4 16 FR non latex catheter placed on 05/14/2020   Previous Home Environment  Living Arrangements: Alone  Lives With: Alone Available Help at Discharge: Family, Available PRN/intermittently Type of Home: House Home Layout: One level Home Access: Stairs to enter Entrance Stairs-Rails: Right, Left Entrance Stairs-Number of Steps: 3 Bathroom Shower/Tub: Multimedia programmer: Standard Bathroom Accessibility: Yes How Accessible: Accessible via walker [place]: No Additional Comments: per chart review  Discharge Living Setting Plans for Discharge Living Setting: Lives with (comment) (to move in with son and his wife) Does the patient have any problems obtaining your medications?: No  Social/Family/Support Systems Contact Information: son, Marcelle Overlie and his wife Anticipated Caregiver: son, daughter in Sports coach and grand daughter Anticipated Caregiver's Contact Information: see above Caregiver Availability: 24/7 Discharge Plan Discussed with Primary Caregiver: Yes Is Caregiver In Agreement with Plan?: Yes Does Caregiver/Family have Issues with Lodging/Transportation while Pt is in Rehab?: No Granddaughter is PT at [facility]  Goals Patient/Family Goal for Rehab: supervision to min assist with PT, OT, and SLP Expected length of stay: ELOS 2 to 3 weeks Pt/Family Agrees to Admission and willing to participate: Yes Program Orientation Provided & Reviewed with Pt/Caregiver Including Roles  & Responsibilities: Yes  Decrease burden of Care through IP rehab admission: n/a  Possible need for SNF placement upon discharge:not anticipated  Patient Condition: This patient's medical and functional status has changed since the consult dated: 05/09/2020 in which the  Rehabilitation Physician determined and documented that the patient's condition is appropriate for intensive rehabilitative care in an inpatient rehabilitation facility. See "History of Present Illness" (above) for medical update. Functional changes are: overall mod to max assist. Patient's medical and functional status update has been discussed with the Rehabilitation physician and patient remains appropriate for inpatient rehabilitation. Will admit to inpatient rehab today 05/15/2020.  Preadmission Screen Completed By:  Cleatrice Burke, RN, 05/15/2020 10:12 AM ______________________________________________________________________   Discussed status with Dr. Ranell Patrick on 05/15/2020 at 42 and received approval for admission today.  Admission Coordinator:  Cleatrice Burke, time 1010 Date 05/15/2020

## 2020-05-11 LAB — GLUCOSE, CAPILLARY
Glucose-Capillary: 102 mg/dL — ABNORMAL HIGH (ref 70–99)
Glucose-Capillary: 123 mg/dL — ABNORMAL HIGH (ref 70–99)
Glucose-Capillary: 88 mg/dL (ref 70–99)
Glucose-Capillary: 125 mg/dL — ABNORMAL HIGH (ref 70–99)
Glucose-Capillary: 104 mg/dL — ABNORMAL HIGH (ref 70–99)

## 2020-05-11 NOTE — Progress Notes (Signed)
Inpatient Rehabilitation Admissions Coordinator  I continue to await [facility] Medicare determination for a possible [facility] admit.  Danne Baxter, RN, MSN Rehab Admissions Coordinator [phone] 05/11/2020 11:58 AM

## 2020-05-11 NOTE — Progress Notes (Signed)
  Speech Language Pathology Treatment: Cognitive-Linquistic  Patient Details Name: Sheila Ray MRN: [id number] DOB: [birth year] Today's Date: 05/11/2020 Time: 6226-3335 SLP Time Calculation (min) (ACUTE ONLY): 21 min  Assessment / Plan / Recommendation Clinical Impression  Pt seen for a speech/swallow tx with limited swallowing noted as pt only ingested thin via cup/straw without overt s/s of aspiration d/t request. Pt would benefit from dysphagia tx during meal intake to assess her full ability and fatigue factor as well.    Pt seen primarily for aphasia tx with sentence completion, phonemic/semantic cueing and repetition utilized with min-mod multimodal cues for expression during words-phrases and eventually with simple sentences during session.  She achieved 60% with confrontational naming task without cueing and 100% with above mentioned cues.  Pt with frequent perseveration of words/phrases during tx such as "evidently" and spontaneous sentences of "I lost my license" and "My son is Marcelle Overlie."  Pt oriented x4 with sentence completion cues provided; convergent task with 60% given cueing; without cues, pt was unable to spontaneously produce items within categories.  Pt able to fluently read sentences to SLP with 80% accuracy and min verbal/visual cues provided.  ST will f/u while in acute setting for aphasia/dysphagia tx and recommend CIR placement when available (pending).    HPI HPI: Sheila Ray is a 79 y.o. 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      SLP Plan  Continue with current plan of care       Recommendations  Diet recommendations: Regular;Thin liquid Liquids provided via: Cup;Straw Medication  Administration: Whole meds with liquid Supervision: Patient able to self feed;Intermittent supervision to cue for compensatory strategies Compensations: Minimize environmental distractions;Slow rate;Small sips/bites                Oral Care Recommendations: Oral care BID Follow up Recommendations: Inpatient Rehab SLP Visit Diagnosis: Aphasia (R47.01) Plan: Continue with current plan of care                      Elvina Sidle, M.S., CCC-SLP 05/11/2020, 1:24 PM

## 2020-05-11 NOTE — Progress Notes (Signed)
STROKE TEAM PROGRESS NOTE   INTERVAL HISTORY Patient is sitting up in bed. She continues to have expressive aphasia and right hemiparesis leg greater than arm.. Neuro exam unchanged. Vital signs stable.  Still awaiting rehab bed  OBJECTIVE Vitals:   05/10/20 2341 05/11/20 0326 05/11/20 0817 05/11/20 1103  BP: 121/66 132/75 114/66 111/66  Pulse: 64 76 82 70  Resp: 18 18 16 18   Temp: 98.1 F (36.7 C) 98 F (36.7 C) 97.8 F (36.6 C) 98.1 F (36.7 C)  TempSrc: Oral Oral Oral Oral  SpO2: 98% 99% 99% 98%  Weight:      Height:       CBC:  Recent Labs  Lab 05/08/20 0110 05/09/20 0353  WBC 13.1* 13.8*  HGB 10.5* 10.0*  HCT 34.6* 33.9*  MCV 76.2* 78.3*  PLT 405* 177   Basic Metabolic Panel:  Recent Labs  Lab 05/05/20 0456 05/06/20 0452 05/08/20 0110 05/09/20 0353  NA  --    < > 132* 135  K  --    < > 3.6 3.6  CL  --    < > 104 109  CO2  --    < > 19* 15*  GLUCOSE  --    < > 123* 115*  BUN  --    < > 20 18  CREATININE  --    < > 0.82 0.82  CALCIUM  --    < > 8.7* 8.8*  MG 2.0  --   --   --    < > = values in this interval not displayed.   Lipid Panel:     Component Value Date/Time   CHOL 173 05/04/2020 0317   CHOL 226 (H) 09/16/2019 1136   CHOL 173 01/06/2013 1354   TRIG 65 05/04/2020 0317   TRIG 120 05/06/2013 1341   TRIG 95 01/06/2013 1354   HDL 36 (L) 05/04/2020 0317   HDL 43 09/16/2019 1136   HDL 47 05/06/2013 1341   HDL 41 01/06/2013 1354   CHOLHDL 4.8 05/04/2020 0317   VLDL 13 05/04/2020 0317   LDLCALC 124 (H) 05/04/2020 0317   LDLCALC 158 (H) 09/16/2019 1136   LDLCALC 109 (H) 05/06/2013 1341   LDLCALC 113 (H) 01/06/2013 1354   HgbA1c:  Lab Results  Component Value Date   HGBA1C 6.4 (H) 05/04/2020   Urine Drug Screen: No results found for: LABOPIA, COCAINSCRNUR, LABBENZ, AMPHETMU, THCU, LABBARB  Alcohol Level     Component Value Date/Time   ETH <10 05/03/2020 1317    IMAGING  CT HEAD CODE STROKE WO CONTRAST CT Angio Head W or Wo  Contrast CT Angio Neck W and/or Wo Contrast 05/03/2020    No acute intracranial hemorrhage. No acute infarction identified.   ASPECT score is 10.   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   No hemodynamically significant stenosis the neck.   Partially imaged mild interstitial pulmonary edema and small bilateral pleural effusions.  CT CEREBRAL PERFUSION W CONTRAST 05/03/2020 No core infarction identified. 67 mL of penumbra in the posterior left MCA territory.  Neuro Interventional Radiology - Cerebral Angiogram with Intervention - Dr Estanislado Pandy 05/03/2020 4:55 PM RT common carotid arteriogram followed by revascularization of occluded sup division M2 branch with x 2 passes with Contact aspiration using Zoom 35 and 55 aspiration catheters and partially of  the distal M3 region of inf division with mechanical thrombolysis achieving a TICI 2C revascularization. Recanalizing Rt ACA A2/A3 junction filling defect noted .  MR BRAIN WO CONTRAST 05/03/2020 1. 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 Associated minimal petechial hemorrhage at the left temporal region without significant mass effect.  2. Underlying age-related cerebral atrophy with moderate chronic microvascular ischemic disease.   MR ANGIO HEAD WO CONTRAST 05/03/2020 1. Technically limited exam due to motion artifact.  2. Previously identified occluded left M2 branch appears grossly patent status post revascularization.  3. Otherwise grossly stable and negative intracranial MRA. No other large vessel occlusion. No other hemodynamically significant or correctable stenosis.   DG CHEST PORT 1 VIEW 05/03/2020 Vascular congestion with mild parenchymal edema on the right consistent with congestive failure.  Transthoracic Echocardiogram  1. Left ventricular ejection fraction, by estimation, is 55 to 60%. The left ventricle has  normal function. The left ventricle has no regional wall motion abnormalities. There is moderate concentric left ventricular hypertrophy. Left ventricular diastolic function could not be evaluated.  2. Right ventricular systolic function is mildly reduced. The right ventricular size is moderately enlarged. There is moderately elevated pulmonary artery systolic pressure. The estimated right ventricular systolic pressure is 32.9 mmHg.  3. Left atrial size was moderately dilated.  4. Right atrial size was severely dilated.  5. A small pericardial effusion is present. The pericardial effusion is posterior to the left ventricle. Large pleural effusion in the left lateral region.  6. The mitral valve is normal in structure. Moderate mitral valve regurgitation. No evidence of mitral stenosis.  7. Tricuspid valve regurgitation is moderate to severe.  8. The aortic valve is normal in structure. Aortic valve regurgitation is trivial. No aortic stenosis is present.  9. The inferior vena cava is normal in size with greater than 50% respiratory variability, suggesting right atrial pressure of 3 mmHg.   ECG - atrial fibrillation - ventricular response 131 BPM (See cardiology reading for complete details)  EEG - This study is suggestive of cortical dysfunction in left hemisphere, maximal left temporal region consistent with underlying stroke.  No seizures or definite epileptiform discharges were seen throughout the recording.   PHYSICAL EXAM       Temp:  [97.8 F (36.6 C)-98.4 F (36.9 C)] 98.1 F (36.7 C) (11/11 1103) Pulse Rate:  [64-97] 70 (11/11 1103) Resp:  [16-20] 18 (11/11 1103) BP: (111-141)/(66-88) 111/66 (11/11 1103) SpO2:  [96 %-99 %] 98 % (11/11 1103)  General - Well nourished, well developed, in no apparent distress, lethargic.  Ophthalmologic - fundi not visualized due to noncooperation.  Neuro - awake, alert, expressive aphasia but no dysarthria,  able to follow most simple  verbal command can repeat 3 words, cannot name when holding up thumb. Left gaze preference, but able to gaze bilaterally and tracks me across the room. Inconsistently blinking to visual threat right but consistent on the left . PERRL, no significant facial droop. Tongue protrusion can stick tongue out with verbal command is midline. LUE strong and active, no drift. LLE brisk movement 3/5 on pain. RUE 1/5 . RLE mild withdraw to pain and can wiggle toes, Sensation, coordination not cooperative and gait not tested.    ASSESSMENT/PLAN Ms. ADDA STOKES is a 79 y.o. 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 She did not receive IV t-PA due to anticoagulation with Xarelto. IR - Cerebral Angiogram with Intervention -  05/03/20 - revascularization of occluded sup division M2 branch.  Stroke: Lt MCA scattered infarcts due to left M2 occlusion s/p [id number] - embolic - likely from Afib even on Xarelto.  CT Head - No acute intracranial hemorrhage. No acute infarction identified. ASPECT score is 10.      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 No hemodynamically significant stenosis the neck.   CT Perfusion - No core infarction identified. 67 mL of penumbra in the posterior left MCA territory.  MRI head x 2 - Patchy acute ischemic infarcts involving the MCA and ACA distributions, predominantly cortical in nature.    MRA head x 2 -  Previously identified occluded left M2 branch appears grossly patent status post revascularization.   EEG - left cortical dysfunction, no seizure  2D Echo - EF 55-60%  [employer] Virus 2 - negative  LDL - 124  HgbA1c - 6.4  VTE prophylaxis - [place] Heparin  Xarelto (rivaroxaban) daily prior to  admission, now on Eliquis due to Xarelto failure.    Therapy recommendations:  CIR  Disposition:  Pending  Afib RVR  On Xarelto PTA  Have switched Xarelto to Eliquis on day 5 following her stroke   rate difficult to control - Cardiology consulted  Treated w/ amio gtt, med adjustements  Now on metoprolol and Cardizem 90 qid->60 qid->60 tid - rate control improving   Change metoprolol to 100XL per cardiology recommendations  Elevated troponin  Trop 68->63->73  Cardiology on board  Considering related to Afib instead of ischemia  Hypertension->Hypotension  Home BP meds: Toprol ; Zestril  Current BP meds: metoprolol 50 bid and diltiazem 60 tid    Change metoprolol to 100XL per cardiology recommendations  Blood pressure stable  . Long-term BP goal normotensive  Hyperlipidemia  Home Lipid lowering medication: none   LDL 124, goal < 70  Put on Zetia  Hx of statin intolerance (Lipitor ; Pravastatin ; Crestor)  Diabetes  Home diabetic meds: metformin  Current diabetic meds: SSI   HgbA1c 6.4, goal < 7.0  CBG monitoring  PCP follow up  Other Stroke Risk Factors  Advanced age  Obesity, recommend weight loss, diet and exercise as appropriate   Hx stroke/TIA - left thalamic stroke (no details)  Other Active Problems  Leukocytosis - WBC's -  13.4->15.6->19.2->16.2->11.1->8.7->13.1 (afebrile)   Hyponatremia - Na - 129 ->130->132 continue to follow  Hospital day # 8  No changes.  Continue ongoing therapies. Await transfer to inpatient rehab when bed available. Medically stable for transfer to rehab. Antony Contras, MD  To contact Stroke Continuity provider, please refer to [email or web address] After hours, contact General Neurology

## 2020-05-11 NOTE — Progress Notes (Signed)
Physical Therapy Treatment Patient Details Name: Sheila Ray MRN: [id number] DOB: 04/17/1941 Today's Date: 05/11/2020    History of Present Illness The pt is a 79 yo female presenting with R-sided facial droop, mild dysarthria, confusion and rapid A fib. 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  Imaging showing patchy acute ischemic infarcts involving the L MCA and ACA distributions.    PT Comments    Pt tolerates treatment well, performing SPT and 3 additional sit to stand trials. PT continues to demonstrate RLE weakness and significant expressive aphasia during session. Pt has a tendency to lean toward R side and requires UE support to maintain sitting balance. Pt will benefit from continued acute PT POC to improve mobility quality and reduce falls risk. PT recommends discharge to CIR as the pt was mobilizing independently prior to admission and continues to demonstrate steady improvement in function to this point.   Follow Up Recommendations  CIR     Equipment Recommendations  Wheelchair (measurements PT);Hospital bed    Recommendations for Other Services       Precautions / Restrictions Precautions Precautions: Fall Restrictions Weight Bearing Restrictions: No    Mobility  Bed Mobility Overal bed mobility: Needs Assistance Bed Mobility: Rolling;Sidelying to Sit Rolling: Min guard Sidelying to sit: Mod assist       General bed mobility comments: verbal cues for technique, modA for LE management and to raise trunk into sitting  Transfers Overall transfer level: Needs assistance Equipment used: 1 person hand held assist Transfers: Sit to/from [employer] Transfers Sit to Stand: Mod assist Stand pivot transfers: Max assist       General transfer comment: PT cues for hand placement, R knee block. Pt with improved initiation in 4th sit to stand trial  Ambulation/Gait                 Stairs              Wheelchair Mobility    Modified Rankin (Stroke Patients Only) Modified Rankin (Stroke Patients Only) Pre-Morbid Rankin Score: No symptoms Modified Rankin: Severe disability     Balance Overall balance assessment: Needs assistance Sitting-balance support: Single extremity supported;Feet supported Sitting balance-Leahy Scale: Poor Sitting balance - Comments: reliant on UE support or minA   Standing balance support: Bilateral upper extremity supported Standing balance-Leahy Scale: Poor Standing balance comment: modA with BUE support and R knee block                            Cognition Arousal/Alertness: Awake/alert Behavior During Therapy: Restless Overall Cognitive Status: Impaired/Different from baseline Area of Impairment: Memory;Following commands;Safety/judgement;Awareness;Problem solving                     Memory: Decreased recall of precautions Following Commands: Follows one step commands consistently;Follows one step commands with increased time Safety/Judgement: Decreased awareness of safety;Decreased awareness of deficits Awareness: Intellectual Problem Solving: Slow processing;Requires verbal cues;Requires tactile cues;Difficulty sequencing        Exercises General Exercises - Lower Extremity Ankle Circles/Pumps: AROM;Both;5 reps Quad Sets: AROM;Both;5 reps    General Comments General comments (skin integrity, edema, etc.): pt on 2L [place] initially, PT weans to room air, SpO2 at 96% at end of session on RA      Pertinent Vitals/Pain Pain Assessment: Faces Faces Pain Scale: Hurts a little bit Pain Location: generalized Pain Descriptors / Indicators: Grimacing Pain Intervention(s): Monitored  during session    Home Living                      Prior Function            PT Goals (current goals can now be found in the care plan section) Acute Rehab PT Goals Patient Stated Goal: to improve mobility Progress towards PT  goals: Progressing toward goals    Frequency    Min 4X/week      PT Plan Current plan remains appropriate    Co-evaluation              AM-PAC PT "6 Clicks" Mobility   Outcome Measure  Help needed turning from your back to your side while in a flat bed without using bedrails?: A Little Help needed moving from lying on your back to sitting on the side of a flat bed without using bedrails?: A Lot Help needed moving to and from a bed to a chair (including a wheelchair)?: Total Help needed standing up from a chair using your arms (e.g., wheelchair or bedside chair)?: A Lot Help needed to walk in hospital room?: Total Help needed climbing 3-5 steps with a railing? : Total 6 Click Score: 10    End of Session Equipment Utilized During Treatment: Gait belt Activity Tolerance: Patient tolerated treatment well Patient left: in chair;with call bell/phone within reach;with chair alarm set Nurse Communication: Mobility status;Need for lift equipment (STEDY) PT Visit Diagnosis: Hemiplegia and hemiparesis;Muscle weakness (generalized) (M62.81);Other abnormalities of gait and mobility (R26.89) Hemiplegia - Right/Left: Right Hemiplegia - dominant/non-dominant: Dominant Hemiplegia - caused by: Cerebral infarction     Time: 1232-1257 PT Time Calculation (min) (ACUTE ONLY): 25 min  Charges:  $Therapeutic Activity: 23-37 mins                     Zenaida Niece, PT, DPT Acute Rehabilitation Pager: [phone]    Zenaida Niece 05/11/2020, 1:06 PM

## 2020-05-12 ENCOUNTER — Inpatient Hospital Stay (HOSPITAL_COMMUNITY): Payer: Medicare Other

## 2020-05-12 LAB — GLUCOSE, CAPILLARY
Glucose-Capillary: 104 mg/dL — ABNORMAL HIGH (ref 70–99)
Glucose-Capillary: 127 mg/dL — ABNORMAL HIGH (ref 70–99)
Glucose-Capillary: 125 mg/dL — ABNORMAL HIGH (ref 70–99)
Glucose-Capillary: 98 mg/dL (ref 70–99)

## 2020-05-12 IMAGING — DX DG CHEST 1V PORT
1 series · 1 of 1 positions shown · non-contrast
Comparison: [DATE]

CLINICAL DATA: Leukocytosis

EXAM:
PORTABLE CHEST 1 VIEW

[chest ap]
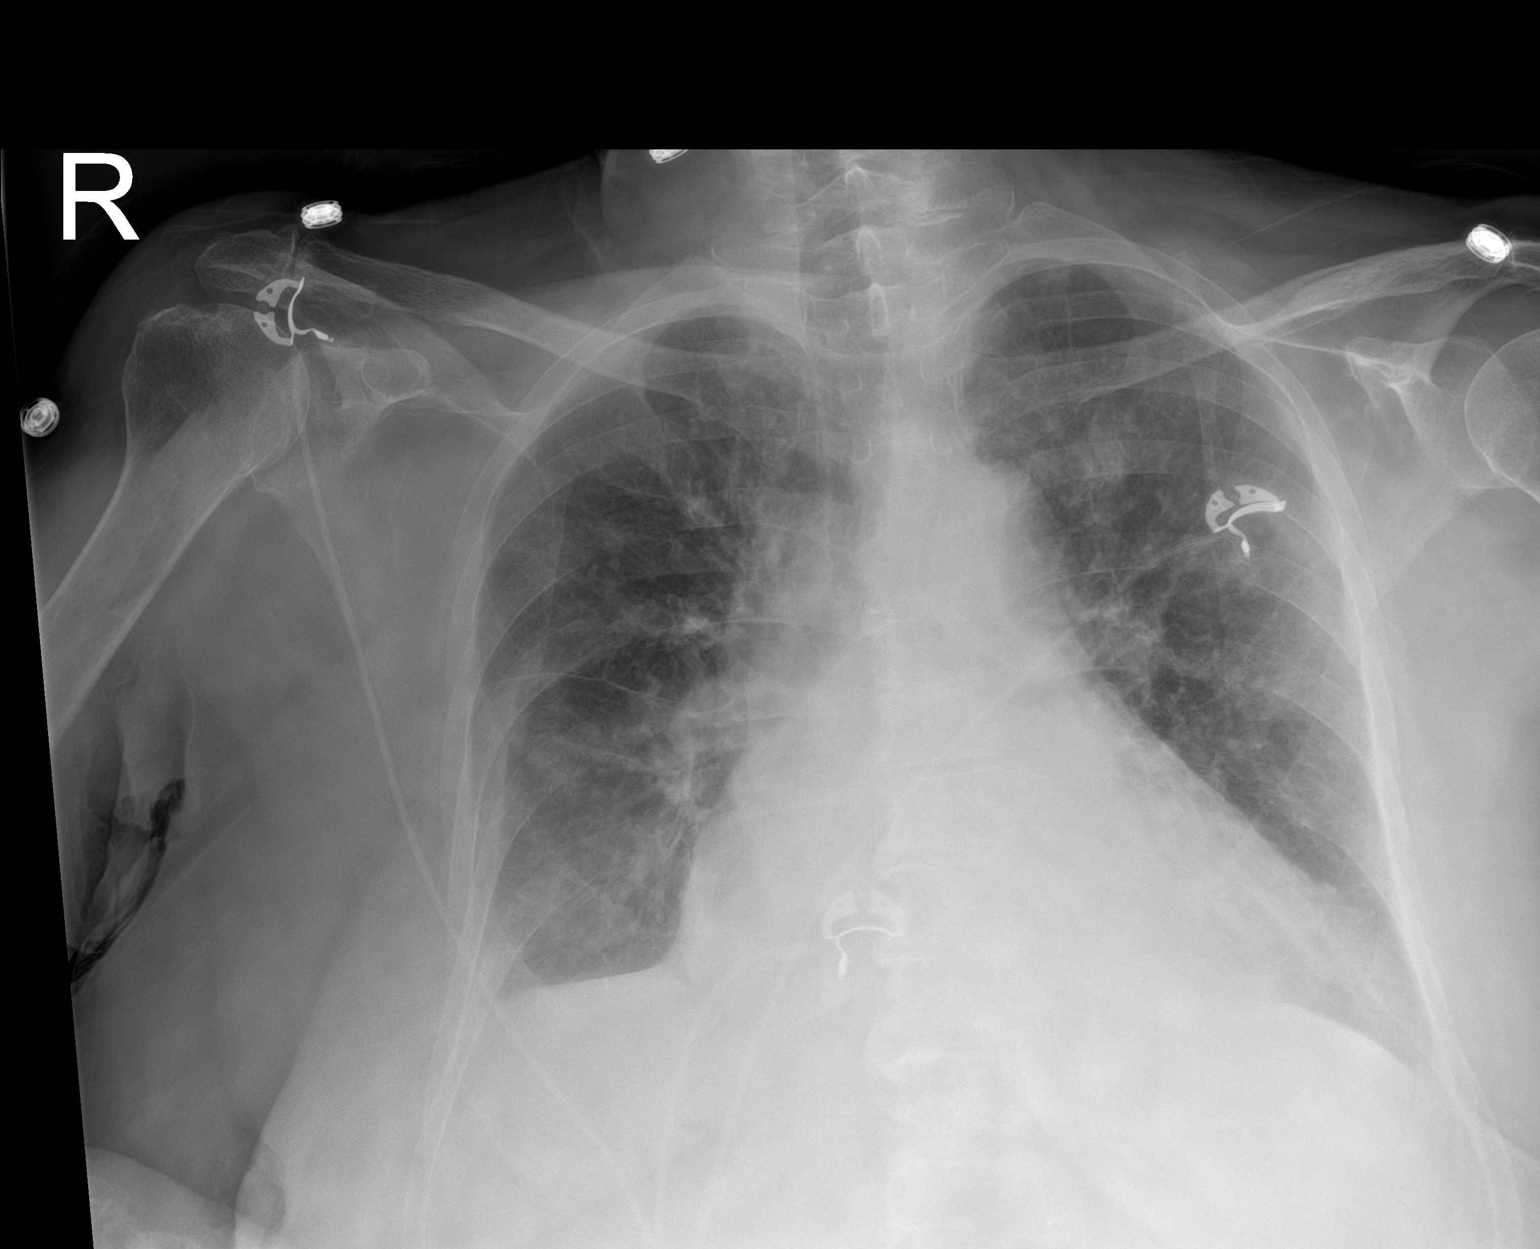

[1 of 1 positions shown; findings below may reference images not displayed]

FINDINGS: Single frontal view of the chest demonstrates stable enlarged
cardiac silhouette. Persistent central vascular congestion and small
bilateral pleural effusions. Patchy areas of consolidation at the
lung bases are unchanged. Slight improvement in the diffuse
interstitial prominence and patchy ground-glass airspace disease
seen previously.
IMPRESSION: 1. Persistent but improving congestive heart failure. Superimposed
infection cannot be completely excluded.

## 2020-05-12 NOTE — Progress Notes (Signed)
  Speech Language Pathology Treatment: Dysphagia  Patient Details Name: Sheila Ray MRN: [id number] DOB: 1941/02/23 Today's Date: 05/12/2020 Time: 9295-7473 SLP Time Calculation (min) (ACUTE ONLY): 21 min  Assessment / Plan / Recommendation Clinical Impression  Pt seen for skilled treatment with intake of thin via cup/straw, puree and solids with adequate oral control noted with all consistencies utilizing small bites/sips with min verbal cues provided during intake.  No s/s of overt aspiration noted and pt no longer requires [place], but is currently on room air.  Pt masticated soft consistency without difficulty and used oral clearance strategies independently; discussed small bites/sips implementation and diet supplements d/t decreased satiety.  Speech was clearer in that she was able to produce longer sentences such as "My son is bringing my glasses" and "I want Ensure, this food is not good", but aphasia tx not target of session.  Noted increased belching during intake which could impact swallowing; discussed this issue with pt during session.  Continued ST required for diet tolerance (x1 for f/u) and aphasia tx with imminent d/c to CIR.  HPI HPI: Sheila Ray is a 79 y.o. 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      SLP Plan  Continue with current plan of care       Recommendations  Diet recommendations: Regular;Thin liquid Liquids provided via: Cup;Straw Medication Administration: Whole meds with liquid Supervision: Patient able to self feed;Intermittent supervision to cue for compensatory strategies Compensations: Minimize environmental distractions;Slow rate;Small sips/bites Postural Changes and/or Swallow  Maneuvers: Seated upright 90 degrees;Upright 30-60 min after meal                Oral Care Recommendations: Oral care BID Follow up Recommendations: Inpatient Rehab SLP Visit Diagnosis: Dysphagia, unspecified (R13.10) Plan: Continue with current plan of care                       Elvina Sidle, M.S., CCC-SLP 05/12/2020, 12:28 PM

## 2020-05-12 NOTE — Progress Notes (Signed)
STROKE TEAM PROGRESS NOTE   INTERVAL HISTORY Patient is sitting up in bed. She continues to have expressive aphasia and right hemiparesis leg greater than arm.. Neuro exam unchanged. Vital signs stable.  Still awaiting rehab bed may go tomorrow morning (Saturday)  OBJECTIVE Vitals:   05/12/20 0033 05/12/20 0411 05/12/20 0800 05/12/20 1303  BP: 100/64 114/71  130/67  Pulse: 79 66  80  Resp: 20 16 19 18   Temp: 97.8 F (36.6 C) 97.6 F (36.4 C)  97.9 F (36.6 C)  TempSrc: Oral Oral  Oral  SpO2: 95% (!) 80%  97%  Weight:      Height:       CBC:  Recent Labs  Lab 05/08/20 0110 05/09/20 0353  WBC 13.1* 13.8*  HGB 10.5* 10.0*  HCT 34.6* 33.9*  MCV 76.2* 78.3*  PLT 405* 703   Basic Metabolic Panel:  Recent Labs  Lab 05/08/20 0110 05/09/20 0353  NA 132* 135  K 3.6 3.6  CL 104 109  CO2 19* 15*  GLUCOSE 123* 115*  BUN 20 18  CREATININE 0.82 0.82  CALCIUM 8.7* 8.8*   Lipid Panel:     Component Value Date/Time   CHOL 173 05/04/2020 0317   CHOL 226 (H) 09/16/2019 1136   CHOL 173 01/06/2013 1354   TRIG 65 05/04/2020 0317   TRIG 120 05/06/2013 1341   TRIG 95 01/06/2013 1354   HDL 36 (L) 05/04/2020 0317   HDL 43 09/16/2019 1136   HDL 47 05/06/2013 1341   HDL 41 01/06/2013 1354   CHOLHDL 4.8 05/04/2020 0317   VLDL 13 05/04/2020 0317   LDLCALC 124 (H) 05/04/2020 0317   LDLCALC 158 (H) 09/16/2019 1136   LDLCALC 109 (H) 05/06/2013 1341   LDLCALC 113 (H) 01/06/2013 1354   HgbA1c:  Lab Results  Component Value Date   HGBA1C 6.4 (H) 05/04/2020   Urine Drug Screen: No results found for: LABOPIA, COCAINSCRNUR, LABBENZ, AMPHETMU, THCU, LABBARB  Alcohol Level     Component Value Date/Time   ETH <10 05/03/2020 1317    IMAGING  CT HEAD CODE STROKE WO CONTRAST CT Angio Head W or Wo Contrast CT Angio Neck W and/or Wo Contrast 05/03/2020    No acute intracranial hemorrhage. No acute infarction identified.   ASPECT score is 10.   Occlusion of left M2 MCA branch  approximately 13 mm from trifurcation.   No hemodynamically significant stenosis the neck.   Partially imaged mild interstitial pulmonary edema and small bilateral pleural effusions.  CT CEREBRAL PERFUSION W CONTRAST 05/03/2020 No core infarction identified. 67 mL of penumbra in the posterior left MCA territory.  Neuro Interventional Radiology - Cerebral Angiogram with Intervention - Dr Estanislado Pandy 05/03/2020 4:55 PM RT common carotid arteriogram followed by revascularization of occluded sup division M2 branch with x 2 passes with Contact aspiration using Zoom 35 and 55 aspiration catheters and partially of  the distal M3 region of inf division with mechanical thrombolysis achieving a TICI 2C revascularization. Recanalizing Rt ACA A2/A3 junction filling defect noted .  MR BRAIN WO CONTRAST 05/03/2020 1. 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 Associated minimal petechial hemorrhage at the left temporal region without significant mass effect.  2. Underlying age-related cerebral atrophy with moderate chronic microvascular ischemic disease.   MR ANGIO HEAD WO CONTRAST 05/03/2020 1. Technically limited exam due to motion artifact.  2. Previously identified occluded left M2 branch appears grossly patent status post revascularization.  3. Otherwise grossly stable and negative intracranial MRA. No other large vessel occlusion. No other hemodynamically significant or correctable stenosis.   DG CHEST PORT 1 VIEW 05/03/2020 Vascular congestion with mild parenchymal edema on the right consistent with congestive failure.  Transthoracic Echocardiogram  1. Left ventricular ejection fraction, by estimation, is 55 to 60%. The left ventricle has normal function. The left ventricle has no regional wall motion abnormalities. There is moderate concentric left ventricular hypertrophy. Left ventricular diastolic function could not be  evaluated.  2. Right ventricular systolic function is mildly reduced. The right ventricular size is moderately enlarged. There is moderately elevated pulmonary artery systolic pressure. The estimated right ventricular systolic pressure is 73.2 mmHg.  3. Left atrial size was moderately dilated.  4. Right atrial size was severely dilated.  5. A small pericardial effusion is present. The pericardial effusion is posterior to the left ventricle. Large pleural effusion in the left lateral region.  6. The mitral valve is normal in structure. Moderate mitral valve regurgitation. No evidence of mitral stenosis.  7. Tricuspid valve regurgitation is moderate to severe.  8. The aortic valve is normal in structure. Aortic valve regurgitation is trivial. No aortic stenosis is present.  9. The inferior vena cava is normal in size with greater than 50% respiratory variability, suggesting right atrial pressure of 3 mmHg.   ECG - atrial fibrillation - ventricular response 131 BPM (See cardiology reading for complete details)  EEG - This study is suggestive of cortical dysfunction in left hemisphere, maximal left temporal region consistent with underlying stroke.  No seizures or definite epileptiform discharges were seen throughout the recording.   PHYSICAL EXAM       Temp:  [97.6 F (36.4 C)-98.4 F (36.9 C)] 97.9 F (36.6 C) (11/12 1303) Pulse Rate:  [66-89] 80 (11/12 1303) Resp:  [16-20] 18 (11/12 1303) BP: (96-141)/(64-80) 130/67 (11/12 1303) SpO2:  [80 %-97 %] 97 % (11/12 1303)  General - Well nourished, well developed, in no apparent distress, lethargic.  Ophthalmologic - fundi not visualized due to noncooperation.  Neuro - awake, alert, expressive aphasia but no dysarthria,  able to follow most simple verbal command can repeat 3 words, cannot name when holding up thumb. Left gaze preference, but able to gaze bilaterally and tracks me across the room. Inconsistently blinking to visual threat  right but consistent on the left . PERRL, no significant facial droop. Tongue protrusion can stick tongue out with verbal command is midline. LUE strong and active, no drift. LLE brisk movement 3/5 on pain. RUE 1/5 . RLE mild withdraw to pain and can wiggle toes, Sensation, coordination not cooperative and gait not tested.    ASSESSMENT/PLAN Ms. DIMA MINI is a 79 y.o. 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 She did not receive IV t-PA due to anticoagulation with Xarelto. IR - Cerebral Angiogram with Intervention - 05/03/20 - revascularization of occluded sup division M2 branch.  Stroke: Lt MCA scattered infarcts due to left M2 occlusion s/p [id number] - embolic - likely from Afib even on Xarelto.  CT Head - No acute intracranial hemorrhage. No acute infarction identified. ASPECT score is 10.      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 No hemodynamically significant stenosis the neck.   CT Perfusion -  No core infarction identified. 67 mL of penumbra in the posterior left MCA territory.  MRI head x 2 - Patchy acute ischemic infarcts involving the MCA and ACA distributions, predominantly cortical in nature.    MRA head x 2 -  Previously identified occluded left M2 branch appears grossly patent status post revascularization.   EEG - left cortical dysfunction, no seizure  2D Echo - EF 55-60%  [employer] Virus 2 - negative  LDL - 124  HgbA1c - 6.4  VTE prophylaxis - [place] Heparin  Xarelto (rivaroxaban) daily prior to admission, now on Eliquis due to Xarelto failure.    Therapy recommendations:  CIR (Saturday 05/13/2020 bed available)  Disposition:  Pending  Afib RVR  On Xarelto PTA  Have switched Xarelto  to Eliquis on day 5 following her stroke   rate difficult to control - Cardiology consulted  Treated w/ amio gtt, med adjustements  Now on metoprolol and Cardizem 90 qid->60 qid->60 tid - rate control improving   Change metoprolol to 100XL per cardiology recommendations  Elevated troponin  Trop 68->63->73  Cardiology on board  Considering related to Afib instead of ischemia  Hypertension->Hypotension  Home BP meds: Toprol ; Zestril  Current BP meds: metoprolol 50 bid and diltiazem 60 tid    Change metoprolol to 100XL per cardiology recommendations  Blood pressure stable  . Long-term BP goal normotensive  Hyperlipidemia  Home Lipid lowering medication: none   LDL 124, goal < 70  Put on Zetia  Hx of statin intolerance (Lipitor ; Pravastatin ; Crestor)  Diabetes  Home diabetic meds: metformin  Current diabetic meds: SSI   HgbA1c 6.4, goal < 7.0  CBG monitoring  PCP follow up  Other Stroke Risk Factors  Advanced age  Obesity, recommend weight loss, diet and exercise as appropriate   Hx stroke/TIA - left thalamic stroke (no details)  Other Active Problems  Leukocytosis - WBC's -  13.4->15.6->19.2->16.2->11.1->8.7->13.1 (afebrile) -  (cbc scheduled morning 05/13/2020)  Hyponatremia - Na - 129 ->130->132 continue to follow (bmp scheduled morning 05/13/2020)  Hospital day # 9  No changes.  Continue ongoing therapies. Await transfer to inpatient rehab when bed available(Sat 05/13/2020). Check bmp/cbc in the morning.  Personally examined patient and images, and have participated in and made any corrections needed to history, physical, neuro exam,assessment and plan as stated above.  I have personally obtained the history, evaluated lab date, reviewed imaging studies and agree with radiology interpretations.    Sarina Ill, MD Stroke Neurology  I spent 25 minutes of face-to-face and non-face-to-face time with patient. This included prechart review,  lab review, study review, order entry, electronic health record documentation, patient education on the different diagnostic and therapeutic options, counseling and coordination of care, risks and benefits of management, compliance, or risk factor reduction      To contact Stroke Continuity provider, please refer to [email or web address] After hours, contact General Neurology

## 2020-05-12 NOTE — Progress Notes (Signed)
ANTICOAGULATION CONSULT NOTE - Follow up Pharmacy Consult for Apixaban  Indication: atrial fibrillation  Allergies  Allergen Reactions  . Crestor [Rosuvastatin] Other (See Comments)    Cramps  . Lipitor [Atorvastatin] Other (See Comments)    Cramps  . Pravastatin Other (See Comments)    Cramps   . Keflex [Cephalexin] Hives    Patient Measurements: Height: 5' 4.5" (163.8 cm) Weight: 79.4 kg (175 lb 0.7 oz) IBW/kg (Calculated) : 55.85  Vital Signs: Temp: 97.6 F (36.4 C) (11/12 0411) Temp Source: Oral (11/12 0411) BP: 114/71 (11/12 0411) Pulse Rate: 66 (11/12 0411)  Labs: No results for input(s): HGB, HCT, PLT, APTT, LABPROT, INR, HEPARINUNFRC, HEPRLOWMOCWT, CREATININE, CKTOTAL, CKMB, TROPONINIHS in the last 72 hours.  Estimated Creatinine Clearance: 57.3 mL/min (by C-G formula based on SCr of 0.82 mg/dL).   Medical History: Past Medical History:  Diagnosis Date  . Arthritis   . Atrial fibrillation ([place])   . Complication of anesthesia   . Diabetes mellitus without complication ([place])   . Diverticulosis   . Fatigue   . Gastric polyp   . Goiter   . Hyperlipidemia   . Hypertension   . Hypothyroid    taken off of thyroid medication 2012014   . Obese   . Osteopenia   . PONV (postoperative nausea and vomiting)   . Postmenopausal   . Rosacea   . Stroke ([place]) 01/15/2013   left thalamic  stroke, small vessel  . Vitamin D deficiency     Assessment: 79 yr old female with hx of atrial fibrillation (on rivaroxaban, last dose taken 05/02/20 AM), DM, HLD, HTN, hypothyroidism, prior L thalamic stroke without residual deficits, admitted 62/9/52 with embolic stroke, despite rivaroxaban therapy PTA. Pharmacy  Consulted 11/8 to dose apixaban for non-valvular atrial fibrillation.   H/H 10.0/33.9, platelets 349 Scr 0.82  (labs on 11/9) ; 79 yrs old; 79.4 kg.  No bleeding noted.  Goal of Therapy:  Prevention of stroke secondary to atrial fibrillation Monitor platelets by  anticoagulation protocol: Yes   Plan:  Apixaban 5 mg PO BID  Monitor CBC, ordered weekly next due 11/15 Mon Monitor for signs/symptoms of bleeding  Nicole Cella, RPh Clinical Pharmacist [phone] Please check AMION for all [facility] phone numbers After 10:00 PM, call [place]  05/12/2020,12:32 PM

## 2020-05-12 NOTE — TOC Benefit Eligibility Note (Signed)
Transition of Care [facility]) Benefit Eligibility Note    Patient Details  Name: Sheila Ray MRN: [id number] Date of Birth: 11-04-1940   Medication/Dose: Arne Cleveland  5 MG BID  Covered?: Yes  Tier: 2 Drug  Prescription Coverage Preferred Pharmacy: [facility]  Spoke with Person/Company/Phone Number:: [facility]  @  [employer] # [phone]  Co-Pay: $133.50  Prior Approval: No  Deductible: Unmet (OUT-OF-POCKET:UNMET)  Additional Notes: APIXABAN : Sheila Ray Phone Number: 05/12/2020, 10:51 AM

## 2020-05-12 NOTE — Progress Notes (Signed)
Occupational Therapy Treatment Patient Details Name: Sheila Ray MRN: [id number] DOB: [birth year] Today's Date: 05/12/2020    History of present illness The pt is a 79 yo female presenting with R-sided facial droop, mild dysarthria, confusion and rapid A fib. 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  Imaging showing patchy acute ischemic infarcts involving the L MCA and ACA distributions.   OT comments  Pt transferred OOB to chair total +2 max (A) this session with chair in R visual field. Pt asked to reach for chair initially with R hand to help with awareness. Pt continues to demonstrate poor visual attention to the R and noted to have some visual deficits this session with head tilt. Recommendation for CIR at this time.   Follow Up Recommendations  CIR;Supervision/Assistance - 24 hour    Equipment Recommendations  3 in 1 bedside commode    Recommendations for Other Services Rehab consult    Precautions / Restrictions Precautions Precautions: Fall Precaution Comments: recommend brief for transfers due to incontinence       Mobility Bed Mobility Overal bed mobility: Needs Assistance Bed Mobility: Rolling;Sidelying to Sit Rolling: Max assist (to the L and min to the R)   Supine to sit: Mod assist;+2 for physical assistance;HOB elevated     General bed mobility comments: pt exiting on the R side with cues for R UE placement. pt needs (A) to rotate on to R UE and push through arm. Pt reaching L and looking left during transfer with mod cues for R gaze to help with transfer pt requires mod (A) for bil Le off bed surface  Transfers Overall transfer level: Needs assistance Equipment used: 2 person hand held assist Transfers: Sit to/from [facility] Sit to Stand: +2 physical assistance;Min assist Stand pivot transfers: +2 physical assistance;Max assist       General transfer comment: incontinence with transfers so  next session add brief to patient. pt requires total block R LE. therapist facilitating weight shift to the L to try to advance R LE and then toward R to progress L LE    Balance Overall balance assessment: Needs assistance Sitting-balance support: Bilateral upper extremity supported;Feet supported Sitting balance-Leahy Scale: Fair Sitting balance - Comments: reliant on support of R UE and static sitting min to min guard   Standing balance support: Bilateral upper extremity supported;During functional activity Standing balance-Leahy Scale: Poor                             ADL either performed or assessed with clinical judgement   ADL Overall ADL's : Needs assistance/impaired     Grooming: Brushing hair;Wash/dry face;Minimal assistance;Bed level Grooming Details (indicate cue type and reason): requesting comb for hair when upright in the chair     Lower Body Bathing: Total assistance                         General ADL Comments: pt incontience on arrival and incontinence with transfer. recommend brief for all transfers.      Vision   Additional Comments: reports "i have to get new glasses" pt with head tilt. pt answering "yeah " to all questions so difficult to accurately determine deficits. pt with L tilt and chin rotate to the R. pt verbalizing trouble seeing showing some awareness   Perception     Praxis      Cognition  Arousal/Alertness: Awake/alert Behavior During Therapy: Flat affect Overall Cognitive Status: Impaired/Different from baseline                                 General Comments: pt was able to flag down staff to ask for help "being cleaned up" pt states "she was going to come back" pt was accurate in reporting incontience.         Exercises     Shoulder Instructions       General Comments skin redness for peri care needs and barrier cream applied    Pertinent Vitals/ Pain       Pain Assessment: Faces Faces Pain  Scale: Hurts even more Pain Location: peri area Pain Descriptors / Indicators: Grimacing Pain Intervention(s): Monitored during session;Repositioned;Other (comment) (barrier cream applied)  Home Living                                          Prior Functioning/Environment              Frequency  Min 2X/week        Progress Toward Goals  OT Goals(current goals can now be found in the care plan section)  Progress towards OT goals: Progressing toward goals  Acute Rehab OT Goals Patient Stated Goal: to comb hair Time For Goal Achievement: 05/22/20 Potential to Achieve Goals: Good ADL Goals Pt Will Perform Grooming: with min assist;sitting Pt Will Perform Upper Body Bathing: with min assist;sitting Pt Will Transfer to Toilet: bedside commode;with mod assist;with +2 assist;stand pivot transfer Pt/caregiver will Perform Home Exercise Program: Increased strength;Right Upper extremity;With Supervision Additional ADL Goal #1: Patient will follow 1 step commands with 75% accuracy to optimize participation in ADLs. Additional ADL Goal #2: Patient will complete bed mobility with min assist and maintain sitting balance at EOB dynamically with no more than min assist for 5 minutes as precursor to ADLs.  Plan Discharge plan remains appropriate    Co-evaluation    PT/OT/SLP Co-Evaluation/Treatment: Yes Reason for Co-Treatment: Complexity of the patient's impairments (multi-system involvement);For patient/therapist safety;Other (comment)   OT goals addressed during session: ADL's and self-care;Proper use of Adaptive equipment and DME;Strengthening/ROM      AM-PAC OT "6 Clicks" Daily Activity     Outcome Measure   Help from another person eating meals?: A Little Help from another person taking care of personal grooming?: A Little Help from another person toileting, which includes using toliet, bedpan, or urinal?: A Lot Help from another person bathing (including  washing, rinsing, drying)?: A Lot Help from another person to put on and taking off regular upper body clothing?: A Little Help from another person to put on and taking off regular lower body clothing?: A Lot 6 Click Score: 15    End of Session Equipment Utilized During Treatment: Gait belt  OT Visit Diagnosis: Other abnormalities of gait and mobility (R26.89);Muscle weakness (generalized) (M62.81);Pain;Cognitive communication deficit (R41.841);Other symptoms and signs involving cognitive function Symptoms and signs involving cognitive functions: Cerebral infarction   Activity Tolerance Patient tolerated treatment well   Patient Left in chair;with call bell/phone within reach;with chair alarm set   Nurse Communication Mobility status;Precautions        Time: 1046-1110 OT Time Calculation (min): 24 min  Charges: OT General Charges $OT Visit: 1 Visit OT Treatments $Self Care/Home Management :  8-22 mins   Fleeta Emmer, OTR/L  Acute Rehabilitation Services Pager: [phone] Office: [phone] .    Jeri Modena 05/12/2020, 3:02 PM

## 2020-05-12 NOTE — Progress Notes (Signed)
Inpatient Rehabilitation Admissions Coordinator  I have received approval for [facility] admit, but no bed expected over the weekend to be available. I contacted pt's son by phone and he is aware and in agreement to admit. If bed becomes unexpectedly available over the weekend, Clemens Catholic, Admissions Coordinator , would contact Attending service to arrange admit. Otherwise we expect bed available first of week. Acute team and TOC made aware.  Danne Baxter, RN, MSN Rehab Admissions Coordinator [phone] 05/12/2020 12:32 PM

## 2020-05-12 NOTE — Progress Notes (Signed)
Physical Therapy Treatment Patient Details Name: Sheila Ray MRN: [id number] DOB: 20-Jan-1941 Today's Date: 05/12/2020    History of Present Illness The pt is a 79 yo female presenting with R-sided facial droop, mild dysarthria, confusion and rapid A fib. 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  Imaging showing patchy acute ischemic infarcts involving the L MCA and ACA distributions.    PT Comments    Treated pt in conjunction with OT this date for max pt safety and to advance mobility. Pt was able to acknowledge when she had a bowel movement or urinated this date. However, she displayed incontinence upon performing stand pivot transfer to R with maxAx2, and thus would benefit from briefs being donned prior to transfers in future sessions. While she required only minAx2 to come to stand, she required maxAx2 to direct the pt to the R to stand pivot and to lift the R LE and then place it as she refused to put the foot down once it was up. Will continue to follow acutely. Current recommendations remain appropriate.    Follow Up Recommendations  CIR     Equipment Recommendations  Wheelchair (measurements PT);Hospital bed    Recommendations for Other Services Rehab consult     Precautions / Restrictions Precautions Precautions: Fall Precaution Comments: recommend brief for transfers due to incontinence Restrictions Weight Bearing Restrictions: No    Mobility  Bed Mobility Overal bed mobility: Needs Assistance Bed Mobility: Rolling;Sidelying to Sit Rolling: Max assist (to the L and min to the R)   Supine to sit: Mod assist;+2 for physical assistance;HOB elevated     General bed mobility comments: MaxA to roll to L but minA to roll to R, with hand-over-hand directions for UE placement and transition of LE across body. pt exiting on the R side with cues for R UE placement. pt needs (A) to rotate on to R UE and push through arm. Pt reaching L  and looking left during transfer with mod cues for R gaze to help with transfer pt requires mod (A) for bil Le off bed surface  Transfers Overall transfer level: Needs assistance Equipment used: 2 person hand held assist Transfers: Sit to/from [facility] Sit to Stand: +2 physical assistance;Min assist Stand pivot transfers: +2 physical assistance;Max assist       General transfer comment: incontinence with transfers so next session add brief to patient. pt requires total block R LE. therapist facilitating weight shift to the L to try to advance R LE and then toward R to progress L LE. Provided assistance at R LE to lift LE but once lifted she refused to place foot on floor and required maxA to make contact with floor.  Ambulation/Gait             General Gait Details: unable   Stairs             Wheelchair Mobility    Modified Rankin (Stroke Patients Only) Modified Rankin (Stroke Patients Only) Pre-Morbid Rankin Score: No symptoms Modified Rankin: Severe disability     Balance Overall balance assessment: Needs assistance Sitting-balance support: Bilateral upper extremity supported;Feet supported Sitting balance-Leahy Scale: Fair Sitting balance - Comments: reliant on support of R UE and static sitting min to min guard   Standing balance support: Bilateral upper extremity supported;During functional activity Standing balance-Leahy Scale: Poor Standing balance comment: modAx2 and R knee block for static standing  Cognition Arousal/Alertness: Awake/alert Behavior During Therapy: Flat affect Overall Cognitive Status: Impaired/Different from baseline Area of Impairment: Memory;Following commands;Safety/judgement;Awareness;Problem solving                   Current Attention Level: Sustained Memory: Decreased short-term memory;Decreased recall of precautions Following Commands: Follows one step commands  inconsistently;Follows one step commands with increased time Safety/Judgement: Decreased awareness of safety;Decreased awareness of deficits Awareness: Intellectual Problem Solving: Slow processing;Requires verbal cues;Requires tactile cues;Difficulty sequencing;Decreased initiation General Comments: Pt perseverated on DOB when asked of current date. pt was able to flag down staff to ask for help "being cleaned up" pt states "she was going to come back" pt was accurate in reporting incontience. Extra time and repeated cues provided to perform tasks.       Exercises      General Comments General comments (skin integrity, edema, etc.): skin redness for peri care needs and barrier cream applied      Pertinent Vitals/Pain Pain Assessment: Faces Faces Pain Scale: Hurts even more Pain Location: peri area Pain Descriptors / Indicators: Grimacing Pain Intervention(s): Limited activity within patient's tolerance;Monitored during session;Repositioned;Other (comment) (barrier cream applied)    Home Living                      Prior Function            PT Goals (current goals can now be found in the care plan section) Acute Rehab PT Goals Patient Stated Goal: to comb hair PT Goal Formulation: With patient Time For Goal Achievement: 05/19/20 Potential to Achieve Goals: Fair Progress towards PT goals: Progressing toward goals    Frequency    Min 4X/week      PT Plan Current plan remains appropriate    Co-evaluation PT/OT/SLP Co-Evaluation/Treatment: Yes Reason for Co-Treatment: Complexity of the patient's impairments (multi-system involvement);For patient/therapist safety;To address functional/ADL transfers PT goals addressed during session: Mobility/safety with mobility;Balance OT goals addressed during session: ADL's and self-care;Proper use of Adaptive equipment and DME;Strengthening/ROM      AM-PAC PT "6 Clicks" Mobility   Outcome Measure  Help needed turning  from your back to your side while in a flat bed without using bedrails?: A Little Help needed moving from lying on your back to sitting on the side of a flat bed without using bedrails?: A Lot Help needed moving to and from a bed to a chair (including a wheelchair)?: A Lot Help needed standing up from a chair using your arms (e.g., wheelchair or bedside chair)?: A Lot Help needed to walk in hospital room?: Total Help needed climbing 3-5 steps with a railing? : Total 6 Click Score: 11    End of Session Equipment Utilized During Treatment: Gait belt Activity Tolerance: Patient tolerated treatment well Patient left: in chair;with call bell/phone within reach;with chair alarm set Nurse Communication: Mobility status PT Visit Diagnosis: Hemiplegia and hemiparesis;Muscle weakness (generalized) (M62.81);Other abnormalities of gait and mobility (R26.89);Unsteadiness on feet (R26.81);Difficulty in walking, not elsewhere classified (R26.2);Other symptoms and signs involving the nervous system (R29.898) Hemiplegia - Right/Left: Right Hemiplegia - dominant/non-dominant: Dominant Hemiplegia - caused by: Cerebral infarction     Time: 1046-1110 PT Time Calculation (min) (ACUTE ONLY): 24 min  Charges:  $Therapeutic Activity: 8-22 mins                     Moishe Spice, PT, DPT Acute Rehabilitation Services  Pager: [phone] Office: [phone]    Orvan Falconer 05/12/2020, 3:37 PM

## 2020-05-12 NOTE — Progress Notes (Signed)
Inpatient Rehabilitation Admissions Coordinator  I have obtained a CIR bed for patient to admit to on Saturday. I have notified Dr. Leonie Man, [facility] and son by phone. I will make the arrangements to admit Saturday. 3 [place] RN can call rehab charge nurse at 12 noon at [phone] on Saturday to give report. Dr. Dagoberto Ligas will round in the morning for final approval to admit to Cir.  Danne Baxter, RN, MSN Rehab Admissions Coordinator [phone] 05/12/2020 2:55 PM

## 2020-05-13 DIAGNOSIS — I63412 Cerebral infarction due to embolism of left middle cerebral artery: Principal | ICD-10-CM

## 2020-05-13 DIAGNOSIS — R600 Localized edema: Secondary | ICD-10-CM

## 2020-05-13 DIAGNOSIS — L03116 Cellulitis of left lower limb: Secondary | ICD-10-CM

## 2020-05-13 DIAGNOSIS — N179 Acute kidney failure, unspecified: Secondary | ICD-10-CM

## 2020-05-13 LAB — GLUCOSE, CAPILLARY
Glucose-Capillary: 106 mg/dL — ABNORMAL HIGH (ref 70–99)
Glucose-Capillary: 107 mg/dL — ABNORMAL HIGH (ref 70–99)
Glucose-Capillary: 121 mg/dL — ABNORMAL HIGH (ref 70–99)
Glucose-Capillary: 130 mg/dL — ABNORMAL HIGH (ref 70–99)
Glucose-Capillary: 132 mg/dL — ABNORMAL HIGH (ref 70–99)

## 2020-05-13 LAB — CBC
HCT: 34.1 % — ABNORMAL LOW (ref 36.0–46.0)
Hemoglobin: 10.4 g/dL — ABNORMAL LOW (ref 12.0–15.0)
MCH: 23.1 pg — ABNORMAL LOW (ref 26.0–34.0)
MCHC: 30.5 g/dL (ref 30.0–36.0)
MCV: 75.8 fL — ABNORMAL LOW (ref 80.0–100.0)
Platelets: 419 10*3/uL — ABNORMAL HIGH (ref 150–400)
RBC: 4.5 MIL/uL (ref 3.87–5.11)
RDW: 16.8 % — ABNORMAL HIGH (ref 11.5–15.5)
WBC: 19.4 10*3/uL — ABNORMAL HIGH (ref 4.0–10.5)
nRBC: 0 % (ref 0.0–0.2)

## 2020-05-13 LAB — URINALYSIS, COMPLETE (UACMP) WITH MICROSCOPIC
Bilirubin Urine: NEGATIVE
Glucose, UA: NEGATIVE mg/dL
Hgb urine dipstick: NEGATIVE
Ketones, ur: NEGATIVE mg/dL
Nitrite: NEGATIVE
Protein, ur: NEGATIVE mg/dL
Specific Gravity, Urine: 1.01 (ref 1.005–1.030)
pH: 5 (ref 5.0–8.0)

## 2020-05-13 LAB — BASIC METABOLIC PANEL
Anion gap: 10 (ref 5–15)
BUN: 29 mg/dL — ABNORMAL HIGH (ref 8–23)
CO2: 19 mmol/L — ABNORMAL LOW (ref 22–32)
Calcium: 8.6 mg/dL — ABNORMAL LOW (ref 8.9–10.3)
Chloride: 101 mmol/L (ref 98–111)
Creatinine, Ser: 1.92 mg/dL — ABNORMAL HIGH (ref 0.44–1.00)
GFR, Estimated: 26 mL/min — ABNORMAL LOW (ref >60)
Glucose, Bld: 123 mg/dL — ABNORMAL HIGH (ref 70–99)
Potassium: 4.1 mmol/L (ref 3.5–5.1)
Sodium: 130 mmol/L — ABNORMAL LOW (ref 135–145)

## 2020-05-13 LAB — LACTIC ACID, PLASMA: Lactic Acid, Venous: 1.1 mmol/L (ref 0.5–1.9)

## 2020-05-13 MED ORDER — SODIUM CHLORIDE 0.9 % IV SOLN: INTRAVENOUS | Status: DC

## 2020-05-13 MED ORDER — AMOXICILLIN-POT CLAVULANATE 500-125 MG PO TABS
1.0000 | ORAL_TABLET | Freq: Two times a day (BID) | ORAL | Status: DC
  Administered 2020-05-13 – 2020-05-14 (×4): 500 mg via ORAL
  Filled 2020-05-13 (×5): qty 1

## 2020-05-13 MED ORDER — AMOXICILLIN-POT CLAVULANATE 875-125 MG PO TABS: 1.0000 | ORAL_TABLET | Freq: Two times a day (BID) | ORAL | Status: DC

## 2020-05-13 NOTE — Progress Notes (Signed)
PHARMACY NOTE:  ANTIMICROBIAL RENAL DOSAGE ADJUSTMENT  Current antimicrobial regimen includes a mismatch between antimicrobial dosage and estimated renal function.  As per policy approved by the Pharmacy & Therapeutics and Medical Executive Committees, the antimicrobial dosage will be adjusted accordingly.  Current antimicrobial dosage:  Augmentin 875mg  BID  Indication: cellulitis  Renal Function:  Estimated Creatinine Clearance: 24.5 mL/min (A) (by C-G formula based on SCr of 1.92 mg/dL (H)). []      On intermittent HD, scheduled: []      On CRRT    Antimicrobial dosage has been changed to:  Augmentin 500mg  BID  Additional comments:   Thank you for allowing pharmacy to be a part of this patient's care.  Dimple Nanas, PharmD PGY-1 Acute Care Pharmacy Resident Office: [phone] 05/13/2020 11:04 AM

## 2020-05-13 NOTE — Consult Note (Signed)
Medical Consultation   Sheila Ray  YHC:[id number]  DOB: 1940/07/31  DOA: 05/03/2020  PCP: Sharion Balloon, FNP   Requesting physician: Dr Erlinda Hong  Reason for consultation:  Leukocytosis   History of Present Illness:  Sheila Ray is an 79 y.o. 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  She did not receive IV TPA due to anticoagulation with Xarelto.  Patient was noted to have mild right leg drift, right facial droop, mild aphasia with CT scan finding consistent with left MCA infarct, patient is now status post thrombectomy.  She was also noted to have A. fib with RVR, cardiology was consulted and Xarelto was switched to Eliquis.  She was started on Cardizem and metoprolol for rate control.   Patient was supposed to be transferred to CIR today, however lab work revealed elevated BUN/creatinine 29/1.92, it was 18/0.82 on 05/09/2020.  CBC showed leukocytosis with WBC 19,000.  UA was clear, chest x-ray did not show significant infiltrate. Patient denies any chest pain or shortness of breath. Denies nausea vomiting or diarrhea Denies abdominal pain or dysuria Does complain of pain in the left lower extremity She does have a history of gout   Review of Systems:  ROS As per HPI otherwise 10 point review of systems negative.    Past Medical History: Past Medical History:  Diagnosis Date  . Arthritis   . Atrial fibrillation ([place])   . Complication of anesthesia   . Diabetes mellitus without complication ([place])   . Diverticulosis   . Fatigue   . Gastric polyp   . Goiter   . Hyperlipidemia   . Hypertension   . Hypothyroid    taken off of thyroid medication 2012014   . Obese   . Osteopenia   . PONV (postoperative nausea and vomiting)   . Postmenopausal   .  Rosacea   . Stroke ([place]) 01/15/2013   left thalamic  stroke, small vessel  . Vitamin D deficiency     Past Surgical History: Past Surgical History:  Procedure Laterality Date  . ABDOMINAL HYSTERECTOMY  1985  . FOOT SURGERY Right 08/2002  . IR CT HEAD LTD  05/03/2020  . IR PERCUTANEOUS ART THROMBECTOMY/INFUSION INTRACRANIAL INC DIAG ANGIO  05/03/2020  . RADIOLOGY WITH ANESTHESIA N/A 05/03/2020   Procedure: IR WITH ANESTHESIA;  Surgeon: Radiologist, Medication, MD;  Location: [facility];  Service: Radiology;  Laterality: N/A;  . right knee arthroscopy   2013   . TONSILLECTOMY    . 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   Procedure: RIGHT TOTAL KNEE ARTHROPLASTY;  Surgeon: Gearlean Alf, MD;  Location: [facility];  Service: Orthopedics;  Laterality: Right;     Allergies:   Allergies  Allergen Reactions  . Crestor [Rosuvastatin] Other (See Comments)    Cramps  . Lipitor [Atorvastatin] Other (See Comments)    Cramps  . Pravastatin Other (See Comments)    Cramps   . Keflex [Cephalexin] Hives     Social History:  reports that she has never smoked. She has never used smokeless tobacco. She reports that she does not drink alcohol and does not use drugs.   Family History: Family History  Problem Relation Age of Onset  . Osteoporosis Mother   . Alzheimer's disease Mother 67  .  Arthritis Mother   . Cancer Father        Lung  . Arthritis Sister   . Obesity Sister   . Heart attack Brother 54  . Heart disease Son   . Arthritis Brother   . Arthritis Sister   . Cancer Sister        breast cancer       Physical Exam: Vitals:   05/13/20 0037 05/13/20 0415 05/13/20 0808 05/13/20 1021  BP: (!) 117/55 132/74 (!) 111/51 108/78  Pulse: 68 79 85 86  Resp: 18 20 18 20   Temp: 97.7 F (36.5 C) 97.6 F (36.4 C) (!) 97.5 F (36.4 C) 98.3 F (36.8 C)  TempSrc: Oral Oral Oral   SpO2: 99% 99% 99% 98%  Weight:      Height:        Constitutional: Alert and awake, oriented x3, not in  any acute distress. Eyes: PERLA, EOMI, irises appear normal, anicteric sclera,  ENMT: external ears and nose appear normal, hearing is normal             Lips appears normal, oropharynx mucosa, tongue, posterior pharynx appear normal  Neck: neck appears normal, no masses, normal ROM, no thyromegaly, no JVD  CVS: S1-S2 clear, no murmur rubs or gallops, no LE edema, normal pedal pulses  Respiratory:  clear to auscultation bilaterally, no wheezing, rales or rhonchi. Respiratory effort normal. No accessory muscle use.  Abdomen: soft nontender, nondistended, normal bowel sounds, no hepatosplenomegaly, no hernias  Musculoskeletal: : Left lower extremity mildly edematous, mild tenderness to palpation Neuro: Right hemiparesis, mild aphasia Psych: judgement and insight appear normal, stable mood and affect, mental status Skin: Skin over left lower extremity is warm to touch, mild erythema,     Data reviewed:  I have personally reviewed following labs and imaging studies Labs:  CBC: Recent Labs  Lab 05/07/20 0445 05/08/20 0110 05/09/20 0353 05/13/20 0317  WBC 8.7 13.1* 13.8* 19.4*  HGB 9.3* 10.5* 10.0* 10.4*  HCT 29.9* 34.6* 33.9* 34.1*  MCV 75.7* 76.2* 78.3* 75.8*  PLT 279 405* 349 419*    Basic Metabolic Panel: Recent Labs  Lab 05/07/20 0445 05/07/20 0445 05/08/20 0110 05/08/20 0110 05/09/20 0353 05/13/20 0317  NA 130*  --  132*  --  135 130*  K 3.9   < > 3.6   < > 3.6 4.1  CL 101  --  104  --  109 101  CO2 19*  --  19*  --  15* 19*  GLUCOSE 112*  --  123*  --  115* 123*  BUN 26*  --  20  --  18 29*  CREATININE 0.94  --  0.82  --  0.82 1.92*  CALCIUM 8.3*  --  8.7*  --  8.8* 8.6*   < > = values in this interval not displayed.    Recent Labs  Lab 05/12/20 0618 05/12/20 1212 05/12/20 1609 05/12/20 2133 05/13/20 0625  GLUCAP 104* 127* 125* 98 107*    Anemia work up No results for input(s): VITAMINB12, FOLATE, FERRITIN, TIBC, IRON, RETICCTPCT in the last 72  hours. Urinalysis    Component Value Date/Time   COLORURINE YELLOW 05/13/2020 0046   APPEARANCEUR HAZY (A) 05/13/2020 0046   LABSPEC 1.010 05/13/2020 0046   PHURINE 5.0 05/13/2020 0046   GLUCOSEU NEGATIVE 05/13/2020 0046   HGBUR NEGATIVE 05/13/2020 0046   BILIRUBINUR NEGATIVE 05/13/2020 0046   KETONESUR NEGATIVE 05/13/2020 0046   PROTEINUR NEGATIVE 05/13/2020 0046  UROBILINOGEN 0.2 07/19/2014 0950   NITRITE NEGATIVE 05/13/2020 0046   LEUKOCYTESUR SMALL (A) 05/13/2020 0046     Microbiology Recent Results (from the past 240 hour(s))  Resp Panel by RT PCR (RSV, Flu A&B, Covid) - Nasopharyngeal Swab     Status: None   Collection Time: 05/03/20  1:28 PM   Specimen: Nasopharyngeal Swab  Result Value Ref Range Status   SARS Coronavirus 2 by RT PCR NEGATIVE NEGATIVE Final    Comment: (NOTE) SARS-CoV-2 target nucleic acids are NOT DETECTED.  The SARS-CoV-2 RNA is generally detectable in upper respiratoy specimens during the acute phase of infection. The lowest concentration of SARS-CoV-2 viral copies this assay can detect is 131 copies/mL. A negative result does not preclude SARS-Cov-2 infection and should not be used as the sole basis for treatment or other patient management decisions. A negative result may occur with  improper specimen collection/handling, submission of specimen other than nasopharyngeal swab, presence of viral mutation(s) within the areas targeted by this assay, and inadequate number of viral copies (<131 copies/mL). A negative result must be combined with clinical observations, patient history, and epidemiological information. The expected result is Negative.  Fact Sheet for Patients:  PinkCheek.be  Fact Sheet for Healthcare Providers:  GravelBags.it  This test is no t yet approved or cleared by the [place] FDA and  has been authorized for detection and/or diagnosis of SARS-CoV-2 by FDA  under an Emergency Use Authorization (EUA). This EUA will remain  in effect (meaning this test can be used) for the duration of the COVID-19 declaration under Section 564(b)(1) of the Act, 21 U.S.C. section 360bbb-3(b)(1), unless the authorization is terminated or revoked sooner.     Influenza A by PCR NEGATIVE NEGATIVE Final   Influenza B by PCR NEGATIVE NEGATIVE Final    Comment: (NOTE) The Xpert Xpress SARS-CoV-2/FLU/RSV assay is intended as an aid in  the diagnosis of influenza from Nasopharyngeal swab specimens and  should not be used as a sole basis for treatment. Nasal washings and  aspirates are unacceptable for Xpert Xpress SARS-CoV-2/FLU/RSV  testing.  Fact Sheet for Patients: PinkCheek.be  Fact Sheet for Healthcare Providers: GravelBags.it  This test is not yet approved or cleared by the [place] FDA and  has been authorized for detection and/or diagnosis of SARS-CoV-2 by  FDA under an Emergency Use Authorization (EUA). This EUA will remain  in effect (meaning this test can be used) for the duration of the  Covid-19 declaration under Section 564(b)(1) of the Act, 21  U.S.C. section 360bbb-3(b)(1), unless the authorization is  terminated or revoked.    Respiratory Syncytial Virus by PCR NEGATIVE NEGATIVE Final    Comment: (NOTE) Fact Sheet for Patients: PinkCheek.be  Fact Sheet for Healthcare Providers: GravelBags.it  This test is not yet approved or cleared by the [place] FDA and  has been authorized for detection and/or diagnosis of SARS-CoV-2 by  FDA under an Emergency Use Authorization (EUA). This EUA will remain  in effect (meaning this test can be used) for the duration of the  COVID-19 declaration under Section 564(b)(1) of the Act, 21 U.S.C.  section 360bbb-3(b)(1), unless the authorization is terminated or  revoked. Performed at  [facility], [address] [place], [place] [address]   MRSA PCR Screening     Status: None   Collection Time: 05/03/20  6:07 PM   Specimen: Nasal Mucosa; Nasopharyngeal  Result Value Ref Range Status   MRSA by PCR NEGATIVE NEGATIVE Final  Comment:        The GeneXpert MRSA Assay (FDA approved for NASAL specimens only), is one component of a comprehensive MRSA colonization surveillance program. It is not intended to diagnose MRSA infection nor to guide or monitor treatment for MRSA infections. Performed at [facility], [place] [address], [place], [place] [address]        Inpatient Medications:   Scheduled Meds: .  stroke: mapping our early stages of recovery book   Does not apply Once  . amoxicillin-clavulanate  1 tablet Oral Q12H  . apixaban  5 mg Oral BID  . diltiazem  60 mg Oral Q8H  . ezetimibe  10 mg Oral Daily  . insulin aspart  0-6 Units Subcutaneous TID AC & HS  . ipratropium  1 spray Each Nare BID  . mouth rinse  15 mL Mouth Rinse BID  . metoprolol succinate  100 mg Oral Daily  . nystatin   Topical BID   Continuous Infusions: . sodium chloride Stopped (05/03/20 2315)  . sodium chloride       Radiological Exams on Admission: DG CHEST PORT 1 VIEW  Result Date: 05/12/2020 CLINICAL DATA:  Leukocytosis EXAM: PORTABLE CHEST 1 VIEW COMPARISON:  05/05/2020 FINDINGS: Single frontal view of the chest demonstrates stable enlarged cardiac silhouette. Persistent central vascular congestion and small bilateral pleural effusions. Patchy areas of consolidation at the lung bases are unchanged. Slight improvement in the diffuse interstitial prominence and patchy ground-glass airspace disease seen previously. IMPRESSION: 1. Persistent but improving congestive heart failure. Superimposed infection cannot be completely excluded. Electronically Signed   By: Randa Ngo M.D.   On: 05/12/2020 21:15    Impression/Recommendations  Active Problems:   Acute ischemic left  MCA stroke (HCC)   Middle cerebral artery embolism, left Leukocytosis Acute kidney injury  Leukocytosis-likely from underlying left lower extremity cellulitis, patient has allergy listed to Keflex, which cause hives.  We will start her on Augmentin 1 tablet p.o. twice daily.  Patient has taken Augmentin in the past, confirmed with pharmacy.  Check lactic acid.  She does have mild hypotension, she does not need fluid bolus at this time but will start IV normal saline at 100 mL/h.  Would be cautious with fluid resuscitation due to underlying history of CHF.  Check blood cultures x2.  Follow CBC in a.m.  Left lower extremity edema-could be from underlying cellulitis as above, will also check lower extremity venous duplex to rule out DVT  Acute kidney injury-patient creatinine was 0.82 on 05/09/20, it has risen to 1.92 today.  Likely from poor p.o. intake, dehydration.  Started on IV normal saline as above.  Follow BMP in am.  Avoid nephrotoxins, ACE inhibitor at this time.  Hypotension-patient has mild hypotension likely from dehydration, will start normal saline as above.  Continue antihypertensive medications metoprolol and Cardizem as she does have history of A. fib.  Heart  rate is controlled.  Atrial fibrillation-heart rate is controlled, continue Cardizem, metoprolol.  Continue anticoagulation with Eliquis.  Left MCA infarct-management as per neurology  Diabetes mellitus type 2-continue very sensitive sliding scale, CBGs well controlled.   Thank you for this consultation.  Our [facility] hospitalist team will follow the patient with you.   Time Spent: 60 minutes  Oswald Hillock M.D. [facility] 05/13/2020, 11:24 AM

## 2020-05-13 NOTE — Progress Notes (Signed)
STROKE TEAM PROGRESS NOTE   INTERVAL HISTORY No family is at bedside. Pt is lying in bed. Still has partial global aphasia and right hemiparesis, leg more than arm. Her labs this morning showed significant elevated Cre and worsening leukocytosis. UA neg. Will have hospitalist consult.   OBJECTIVE Vitals:   05/12/20 1626 05/12/20 2025 05/13/20 0037 05/13/20 0415  BP: 115/80 (!) 117/57 (!) 117/55 132/74  Pulse:  77 68 79  Resp: 20 18 18 20   Temp: 97.6 F (36.4 C) 97.8 F (36.6 C) 97.7 F (36.5 C) 97.6 F (36.4 C)  TempSrc: Oral Oral Oral Oral  SpO2: 99% 98% 99% 99%  Weight:      Height:       CBC:  Recent Labs  Lab 05/09/20 0353 05/13/20 0317  WBC 13.8* 19.4*  HGB 10.0* 10.4*  HCT 33.9* 34.1*  MCV 78.3* 75.8*  PLT 349 403*   Basic Metabolic Panel:  Recent Labs  Lab 05/09/20 0353 05/13/20 0317  NA 135 130*  K 3.6 4.1  CL 109 101  CO2 15* 19*  GLUCOSE 115* 123*  BUN 18 29*  CREATININE 0.82 1.92*  CALCIUM 8.8* 8.6*   Lipid Panel:     Component Value Date/Time   CHOL 173 05/04/2020 0317   CHOL 226 (H) 09/16/2019 1136   CHOL 173 01/06/2013 1354   TRIG 65 05/04/2020 0317   TRIG 120 05/06/2013 1341   TRIG 95 01/06/2013 1354   HDL 36 (L) 05/04/2020 0317   HDL 43 09/16/2019 1136   HDL 47 05/06/2013 1341   HDL 41 01/06/2013 1354   CHOLHDL 4.8 05/04/2020 0317   VLDL 13 05/04/2020 0317   LDLCALC 124 (H) 05/04/2020 0317   LDLCALC 158 (H) 09/16/2019 1136   LDLCALC 109 (H) 05/06/2013 1341   LDLCALC 113 (H) 01/06/2013 1354   HgbA1c:  Lab Results  Component Value Date   HGBA1C 6.4 (H) 05/04/2020   Urine Drug Screen: No results found for: LABOPIA, COCAINSCRNUR, LABBENZ, AMPHETMU, THCU, LABBARB  Alcohol Level     Component Value Date/Time   ETH <10 05/03/2020 1317    IMAGING  CT HEAD CODE STROKE WO CONTRAST CT Angio Head W or Wo Contrast CT Angio Neck W and/or Wo Contrast 05/03/2020    No acute intracranial hemorrhage. No acute infarction identified.    ASPECT score is 10.   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   No hemodynamically significant stenosis the neck.   Partially imaged mild interstitial pulmonary edema and small bilateral pleural effusions.  CT CEREBRAL PERFUSION W CONTRAST 05/03/2020 No core infarction identified. 67 mL of penumbra in the posterior left MCA territory.  Neuro Interventional Radiology - Cerebral Angiogram with Intervention - Dr Estanislado Pandy 05/03/2020 4:55 PM RT common carotid arteriogram followed by revascularization of occluded sup division M2 branch with x 2 passes with Contact aspiration using Zoom 35 and 55 aspiration catheters and partially of  the distal M3 region of inf division with mechanical thrombolysis achieving a TICI 2C revascularization. Recanalizing Rt ACA A2/A3 junction filling defect noted .  MR BRAIN WO CONTRAST 05/03/2020 1. 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 Associated minimal petechial hemorrhage at the left temporal region without significant mass effect.  2. Underlying age-related cerebral atrophy with moderate chronic microvascular ischemic disease.   MR ANGIO HEAD WO CONTRAST 05/03/2020 1. Technically limited exam due to motion artifact.  2. Previously identified occluded left  M2 branch appears grossly patent status post revascularization.  3. Otherwise grossly stable and negative intracranial MRA. No other large vessel occlusion. No other hemodynamically significant or correctable stenosis.   DG CHEST PORT 1 VIEW 05/03/2020 Vascular congestion with mild parenchymal edema on the right consistent with congestive failure.  Transthoracic Echocardiogram  1. Left ventricular ejection fraction, by estimation, is 55 to 60%. The left ventricle has normal function. The left ventricle has no regional wall motion abnormalities. There is moderate concentric left ventricular  hypertrophy. Left ventricular diastolic function could not be evaluated.  2. Right ventricular systolic function is mildly reduced. The right ventricular size is moderately enlarged. There is moderately elevated pulmonary artery systolic pressure. The estimated right ventricular systolic pressure is 70.6 mmHg.  3. Left atrial size was moderately dilated.  4. Right atrial size was severely dilated.  5. A small pericardial effusion is present. The pericardial effusion is posterior to the left ventricle. Large pleural effusion in the left lateral region.  6. The mitral valve is normal in structure. Moderate mitral valve regurgitation. No evidence of mitral stenosis.  7. Tricuspid valve regurgitation is moderate to severe.  8. The aortic valve is normal in structure. Aortic valve regurgitation is trivial. No aortic stenosis is present.  9. The inferior vena cava is normal in size with greater than 50% respiratory variability, suggesting right atrial pressure of 3 mmHg.   ECG - atrial fibrillation - ventricular response 131 BPM (See cardiology reading for complete details)  EEG - This study is suggestive of cortical dysfunction in left hemisphere, maximal left temporal region consistent with underlying stroke.  No seizures or definite epileptiform discharges were seen throughout the recording.   PHYSICAL EXAM       Temp:  [97.6 F (36.4 C)-97.9 F (36.6 C)] 97.6 F (36.4 C) (11/13 0415) Pulse Rate:  [68-80] 79 (11/13 0415) Resp:  [18-20] 20 (11/13 0415) BP: (115-132)/(55-80) 132/74 (11/13 0415) SpO2:  [97 %-99 %] 99 % (11/13 0415)  General - Well nourished, well developed, mild SOB, lethargic.  Ophthalmologic - fundi not visualized due to noncooperation.  Cardiovascular - irregularly irregular heart rate and rhythm.  Neuro - awake, alert, expressive aphasia but no dysarthria, able to follow some simple verbal commands, but still has slow response and perseveration, can repeat 3  words sentence, not able to name. Able to gaze bilaterally. Blinking to visual threat bilaterally. PERRL, no significant facial droop. Tongue protrusion midline. LUE strong and active, no drift. LLE 3/5. RUE 3+/5 with drift but not hitting bed in 10 sec. RLE mild withdraw to pain and can wiggle toes. Sensation subjectively symmetrical (but has aphasia), coordination not cooperative and gait not tested.    ASSESSMENT/PLAN [name] is a 79 y.o. 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 She did not receive IV t-PA due to anticoagulation with Xarelto. IR - Cerebral Angiogram with Intervention - 05/03/20 - revascularization of occluded sup division M2 branch.  Stroke: Lt MCA scattered infarcts due to left M2 occlusion s/p CBJS2G - embolic - likely from Afib even on Xarelto.  CT Head - No acute intracranial hemorrhage. No acute infarction identified. ASPECT score is 10.      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 No hemodynamically significant stenosis the neck.  CT Perfusion - No core infarction identified. 67 mL of penumbra in the posterior left MCA territory.  MRI head x 2 - Patchy acute ischemic infarcts involving the MCA and ACA distributions, predominantly cortical in nature.    MRA head x 2 -  Previously identified occluded left M2 branch appears grossly patent status post revascularization.   EEG - left cortical dysfunction, no seizure  2D Echo - EF 55-60%  [employer] Virus 2 - negative  LDL - 124  HgbA1c - 6.4  VTE prophylaxis - [place] Heparin  Xarelto (rivaroxaban) daily prior to admission, now on Eliquis due to Xarelto failure.  Therapy recommendations:  CIR   Disposition:  Pending  Afib  RVR  On Xarelto PTA  Now on Eliquis  rate difficult to control - cardiology consulted, was on amio gtt, med adjustements  Now on metoprolol and Cardizem 90 qid->60 qid->60 tid   Change metoprolol to 100XL per cardiology recommendations  Rate controlled  Elevated troponin  Trop 68->63->73  Cardiology on board  Considering related to Afib instead of ischemia  Hx of hypertension Hypotension  Home BP meds: Toprol ; Zestril  Current BP meds: metoprolol 50 bid and diltiazem 60 tid    Change metoprolol to 100XL per cardiology recommendations  Blood pressure stable  . Long-term BP goal normotensive  Hyperlipidemia  Home Lipid lowering medication: none   LDL 124, goal < 70  Put on Zetia  Hx of statin intolerance (Lipitor ; Pravastatin ; Crestor)  Diabetes  Home diabetic meds: metformin  Current diabetic meds: SSI   HgbA1c 6.4, goal < 7.0  CBG monitoring  PCP follow up  AKI  Creatinine 0.82->1.92  Consulted hospitalist Dr. Darrick Meigs  On IV fluid  Appreciate Dr. Darrick Meigs help  Leukocytosis   WBC's -  13.4->15.6->19.2->16.2->11.1->8.7->13.1->19.4  Dr. Darrick Meigs on board  On augmentin   CBC monitoring  Other Stroke Risk Factors  Advanced age  Obesity, recommend weight loss, diet and exercise as appropriate   Hx stroke/TIA - left thalamic stroke (no details)  Other Active Problems  Hyponatremia - Na - 129 ->130->132   Hospital day # 10  Patient had significant change of her kidney function and WBC count, found to have AKI and leukocytosis this morning. I consulted Dr. Darrick Meigs. I spent  35 minutes in total face-to-face time with the patient, more than 50% of which was spent in counseling and coordination of care, reviewing test results, images and medication, and discussing the diagnosis, treatment plan and potential prognosis. This patient's care requiresreview of multiple databases, neurological assessment, discussion with family, other specialists and  medical decision making of high complexity.  Rosalin Hawking, MD PhD Stroke Neurology 05/13/2020 9:46 PM   To contact Stroke Continuity provider, please refer to [email or web address] After hours, contact General Neurology

## 2020-05-13 NOTE — Progress Notes (Signed)
Inpatient Rehab Admissions Coordinator:   Pt. Will not admit to CIR today due to elevated WBCs this AM. Will follow for potential admit once this has resolved and once any needed work-up is completed.   Clemens Catholic, [place], [place] Admissions Coordinator  [phone] ([place]) [phone] (office)

## 2020-05-13 NOTE — Progress Notes (Signed)
Physical Therapy Treatment Patient Details Name: Sheila Ray MRN: [id number] DOB: 07-02-1940 Today's Date: 05/13/2020    History of Present Illness The pt is a 79 yo female presenting with R-sided facial droop, mild dysarthria, confusion and rapid A fib. 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  Imaging showing patchy acute ischemic infarcts involving the L MCA and ACA distributions.    PT Comments    Pt supine in bed on arrival this session.  Pt required encouragement from son to participate in PT session.  Plan remains appropriate for aggressive rehab in CIR to maximize functional gains before returning home.    Follow Up Recommendations  CIR     Equipment Recommendations  Wheelchair (measurements PT);Hospital bed    Recommendations for Other Services       Precautions / Restrictions Precautions Precautions: Fall Precaution Comments: recommend brief for transfers due to incontinence Restrictions Weight Bearing Restrictions: No    Mobility  Bed Mobility Overal bed mobility: Needs Assistance Bed Mobility: Rolling;Sidelying to Sit Rolling: Max assist Sidelying to sit: Mod assist       General bed mobility comments: Max assistance to roll to L side due to strong tone in R leg.  Pt required decreased assistance elevate trunk.  Once in sitting total assistance to scoot to edge of bed with use of bed pad.  Transfers Overall transfer level: Needs assistance Equipment used: Ambulation equipment used (sara stedy) Transfers: Sit to/from Stand Sit to Stand: Mod assist         General transfer comment: Pt required increased time to follow commands during transfer.  Able to reach for sara stedy cross bar to pull into standing.  Pt performed x 3 standing trials with emphasis on hip/trunk/head extension.  Ambulation/Gait Ambulation/Gait assistance:  (NT)               Stairs             Wheelchair Mobility     Modified Rankin (Stroke Patients Only)       Balance Overall balance assessment: Needs assistance Sitting-balance support: Bilateral upper extremity supported;Feet supported Sitting balance-Leahy Scale: Fair Sitting balance - Comments: reliant on support of R UE and static sitting min to min guard Postural control: Right lateral lean   Standing balance-Leahy Scale: Poor Standing balance comment: modAx2 and R knee block for static standing with sara stedy                            Cognition Arousal/Alertness: Awake/alert Behavior During Therapy: Flat affect Overall Cognitive Status: Impaired/Different from baseline Area of Impairment: Memory;Following commands;Safety/judgement;Awareness;Problem solving                   Current Attention Level: Sustained Memory: Decreased short-term memory;Decreased recall of precautions Following Commands: Follows one step commands inconsistently;Follows one step commands with increased time Safety/Judgement: Decreased awareness of safety;Decreased awareness of deficits Awareness: Intellectual Problem Solving: Slow processing;Requires verbal cues;Requires tactile cues;Difficulty sequencing;Decreased initiation General Comments: Pt presents with aphasia and clearly frustrated by this.  Pt required encouragement from son to participate in PT session this pm.      Exercises      General Comments        Pertinent Vitals/Pain Pain Assessment: Faces Faces Pain Scale: Hurts a little bit Pain Location: peri area Pain Descriptors / Indicators: Grimacing Pain Intervention(s): Monitored during session;Repositioned    Home Living  Prior Function            PT Goals (current goals can now be found in the care plan section) Acute Rehab PT Goals Patient Stated Goal: none stated Potential to Achieve Goals: Fair Progress towards PT goals: Progressing toward goals    Frequency    Min  4X/week      PT Plan Current plan remains appropriate    Co-evaluation              AM-PAC PT "6 Clicks" Mobility   Outcome Measure  Help needed turning from your back to your side while in a flat bed without using bedrails?: A Little Help needed moving from lying on your back to sitting on the side of a flat bed without using bedrails?: A Lot Help needed moving to and from a bed to a chair (including a wheelchair)?: A Lot Help needed standing up from a chair using your arms (e.g., wheelchair or bedside chair)?: A Lot Help needed to walk in hospital room?: Total Help needed climbing 3-5 steps with a railing? : Total 6 Click Score: 11    End of Session Equipment Utilized During Treatment: Gait belt Activity Tolerance: Patient tolerated treatment well Patient left: in chair;with call bell/phone within reach;with chair alarm set Nurse Communication: Mobility status PT Visit Diagnosis: Hemiplegia and hemiparesis;Muscle weakness (generalized) (M62.81);Other abnormalities of gait and mobility (R26.89);Unsteadiness on feet (R26.81);Difficulty in walking, not elsewhere classified (R26.2);Other symptoms and signs involving the nervous system (R29.898) Hemiplegia - Right/Left: Right Hemiplegia - dominant/non-dominant: Dominant Hemiplegia - caused by: Cerebral infarction     Time: 7829-5621 PT Time Calculation (min) (ACUTE ONLY): 23 min  Charges:  $Therapeutic Activity: 23-37 mins                     Bonney Leitz , PTA Acute Rehabilitation Services Pager [phone] Office [phone]     Brandii Lakey Artis Delay 05/13/2020, 5:13 PM

## 2020-05-14 ENCOUNTER — Inpatient Hospital Stay (HOSPITAL_COMMUNITY): Payer: Medicare Other | Admitting: Occupational Therapy

## 2020-05-14 ENCOUNTER — Inpatient Hospital Stay (HOSPITAL_COMMUNITY): Payer: Medicare Other | Admitting: Speech Pathology

## 2020-05-14 ENCOUNTER — Inpatient Hospital Stay (HOSPITAL_COMMUNITY): Payer: Medicare Other | Admitting: Physical Therapy

## 2020-05-14 ENCOUNTER — Inpatient Hospital Stay (HOSPITAL_COMMUNITY): Payer: Medicare Other

## 2020-05-14 DIAGNOSIS — M7989 Other specified soft tissue disorders: Secondary | ICD-10-CM | POA: Diagnosis not present

## 2020-05-14 LAB — BASIC METABOLIC PANEL
Anion gap: 11 (ref 5–15)
BUN: 32 mg/dL — ABNORMAL HIGH (ref 8–23)
CO2: 18 mmol/L — ABNORMAL LOW (ref 22–32)
Calcium: 8.3 mg/dL — ABNORMAL LOW (ref 8.9–10.3)
Chloride: 100 mmol/L (ref 98–111)
Creatinine, Ser: 2.07 mg/dL — ABNORMAL HIGH (ref 0.44–1.00)
GFR, Estimated: 24 mL/min — ABNORMAL LOW (ref >60)
Glucose, Bld: 131 mg/dL — ABNORMAL HIGH (ref 70–99)
Potassium: 4.1 mmol/L (ref 3.5–5.1)
Sodium: 129 mmol/L — ABNORMAL LOW (ref 135–145)

## 2020-05-14 LAB — CBC
HCT: 34.4 % — ABNORMAL LOW (ref 36.0–46.0)
Hemoglobin: 10.5 g/dL — ABNORMAL LOW (ref 12.0–15.0)
MCH: 23.4 pg — ABNORMAL LOW (ref 26.0–34.0)
MCHC: 30.5 g/dL (ref 30.0–36.0)
MCV: 76.6 fL — ABNORMAL LOW (ref 80.0–100.0)
Platelets: 448 10*3/uL — ABNORMAL HIGH (ref 150–400)
RBC: 4.49 MIL/uL (ref 3.87–5.11)
RDW: 16.8 % — ABNORMAL HIGH (ref 11.5–15.5)
WBC: 24.1 10*3/uL — ABNORMAL HIGH (ref 4.0–10.5)
nRBC: 0 % (ref 0.0–0.2)

## 2020-05-14 LAB — GLUCOSE, CAPILLARY
Glucose-Capillary: 106 mg/dL — ABNORMAL HIGH (ref 70–99)
Glucose-Capillary: 166 mg/dL — ABNORMAL HIGH (ref 70–99)
Glucose-Capillary: 104 mg/dL — ABNORMAL HIGH (ref 70–99)
Glucose-Capillary: 101 mg/dL — ABNORMAL HIGH (ref 70–99)

## 2020-05-14 LAB — MAGNESIUM: Magnesium: 1.8 mg/dL (ref 1.7–2.4)

## 2020-05-14 IMAGING — US US RENAL
1 series · 14 of 24 positions shown · non-contrast
Comparison: None.

CLINICAL DATA: Acute renal insufficiency

EXAM:
RENAL / URINARY TRACT ULTRASOUND COMPLETE

[Series 1: us renal · 14 of 24 slices shown]
[im 1/24]
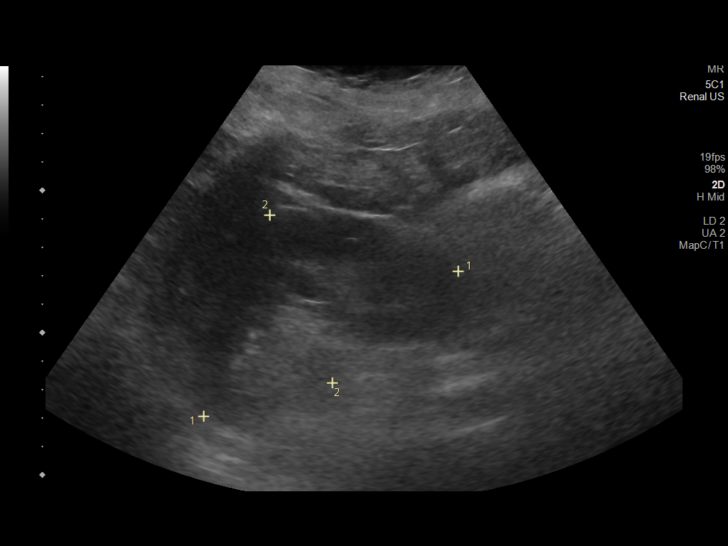
[im 3/24]
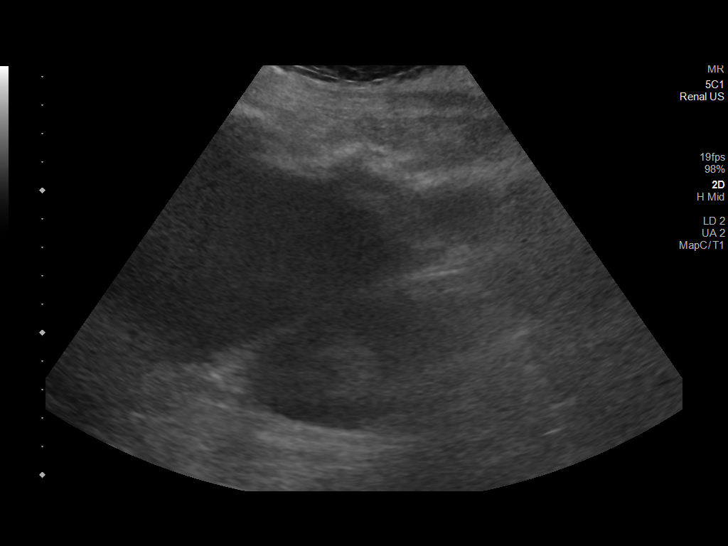
[im 5/24]
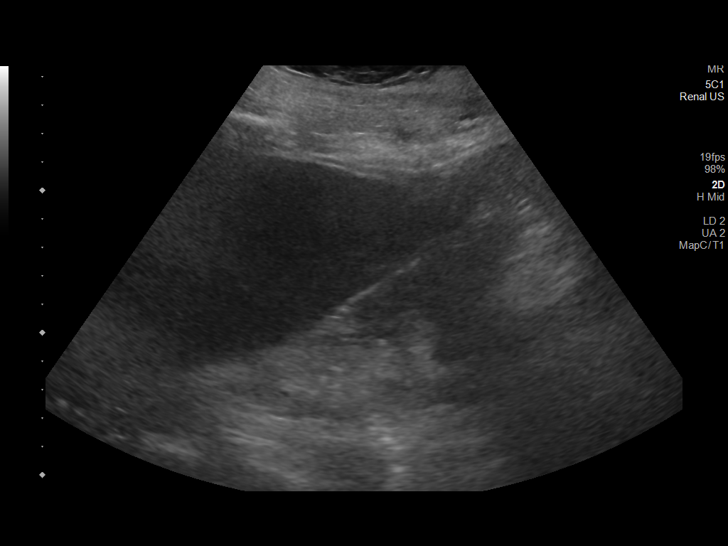
[im 7/24]
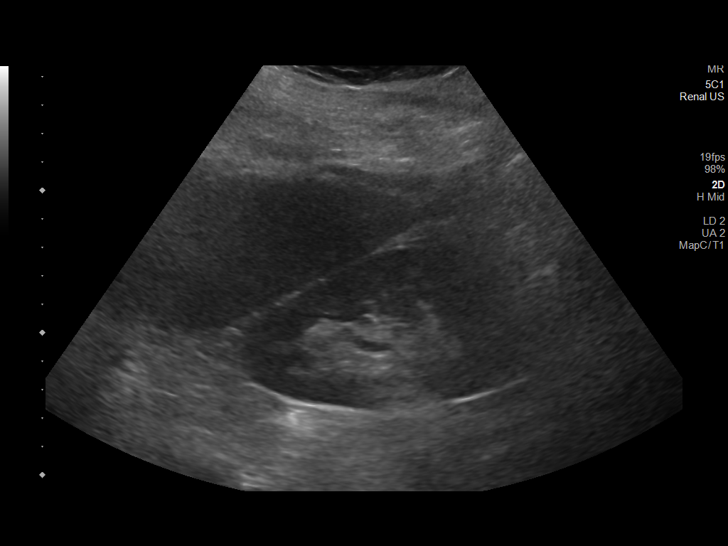
[im 8/24]
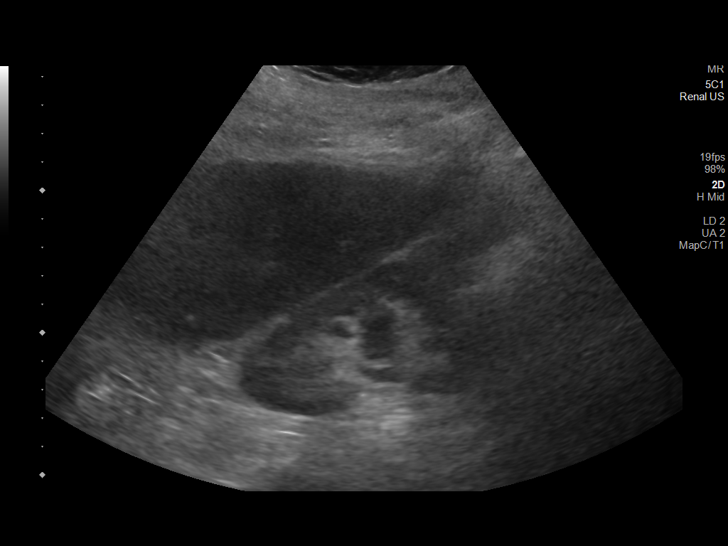
[im 10/24]
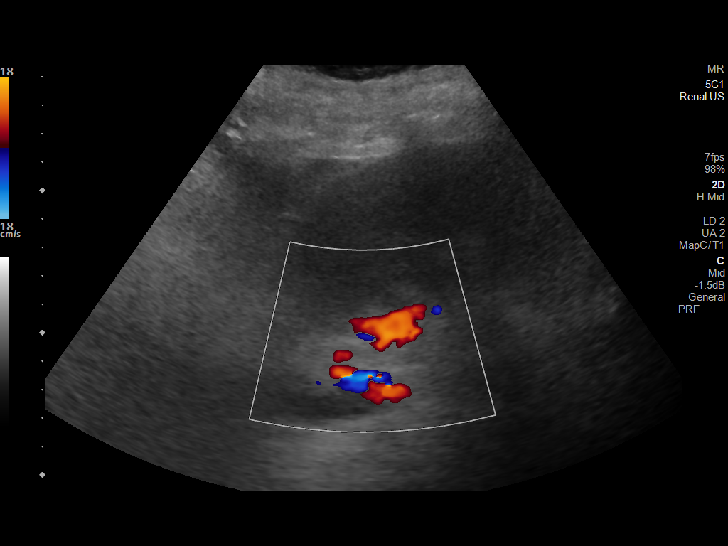
[im 12/24]
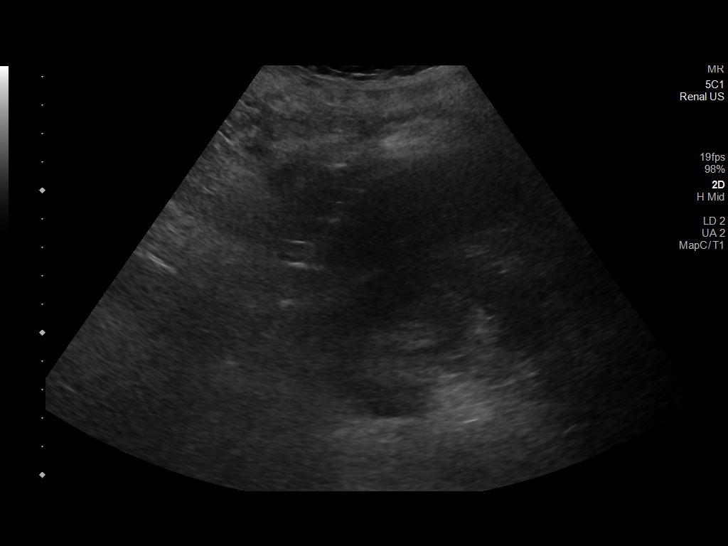
[im 13/24]
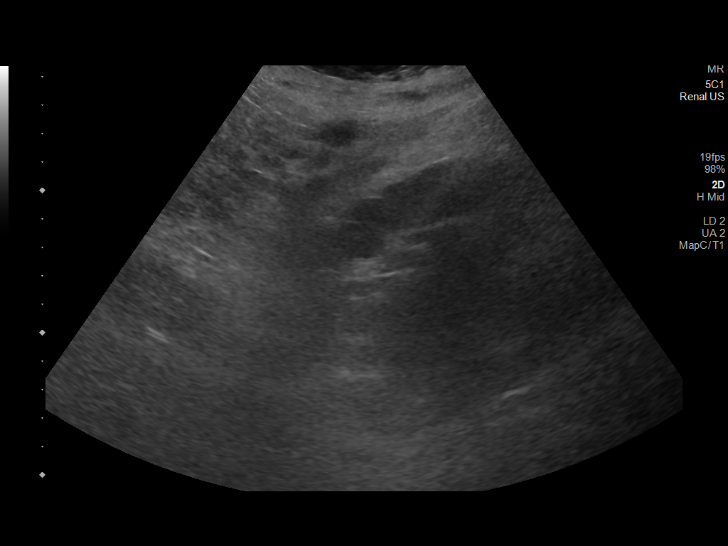
[im 15/24]
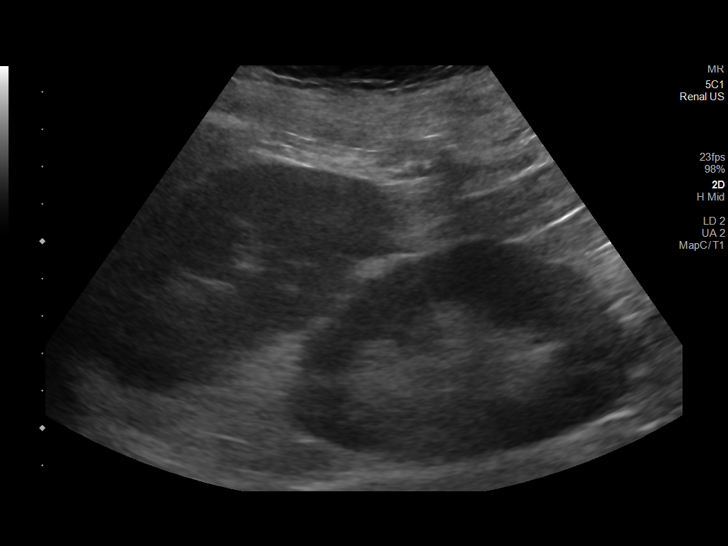
[im 17/24]
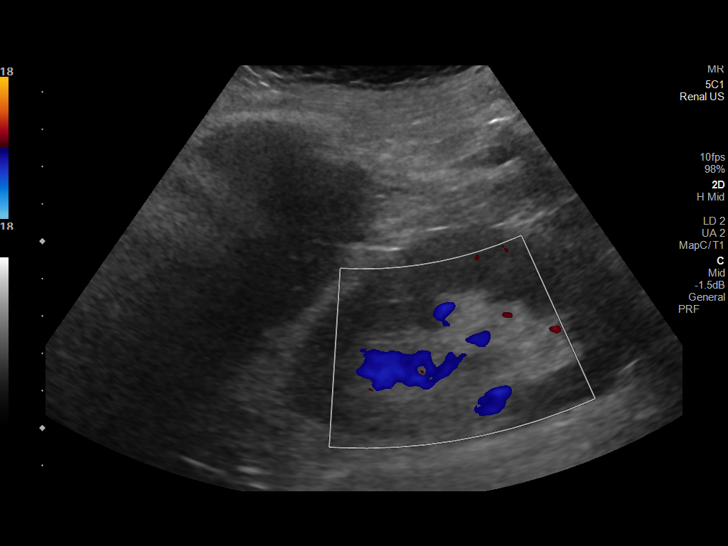
[im 19/24]
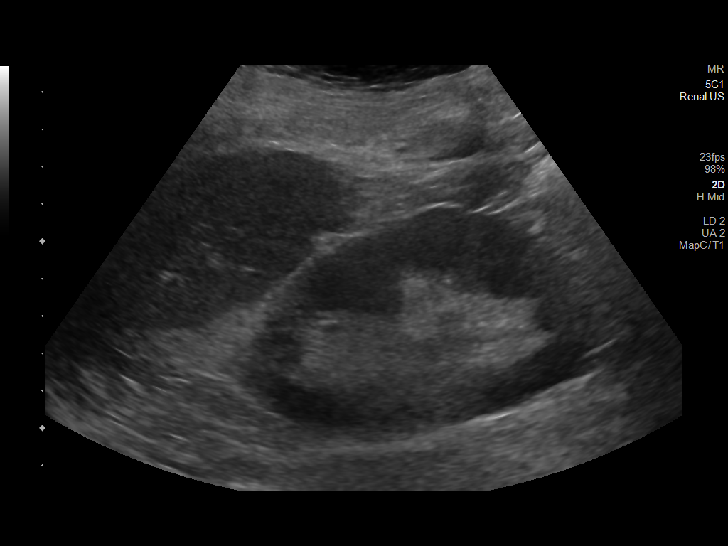
[im 20/24]
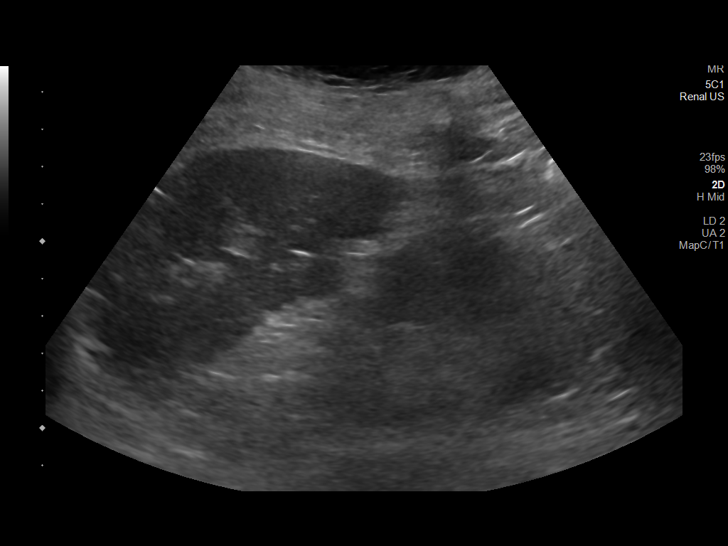
[im 22/24]
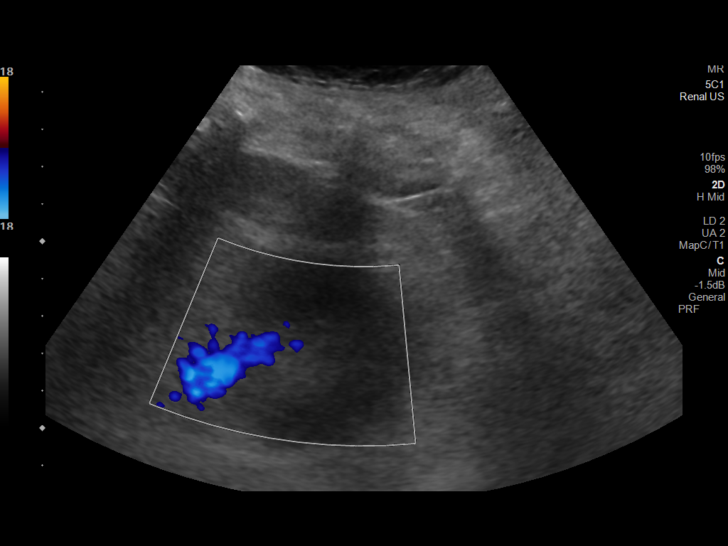
[im 24/24]
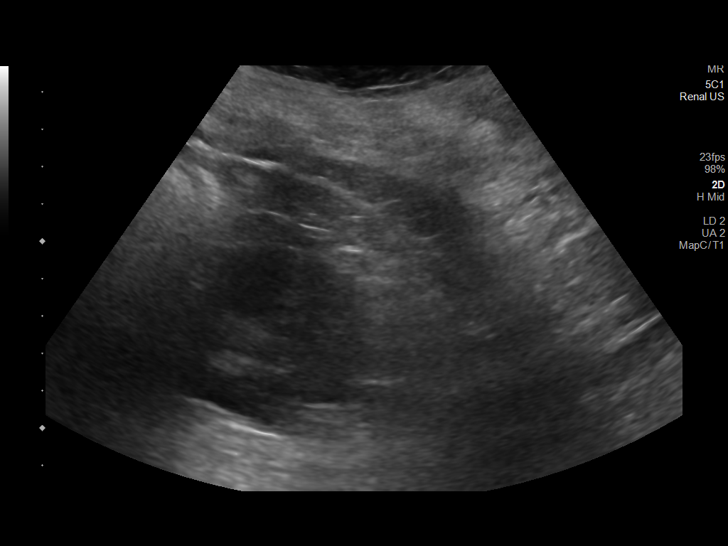

[14 of 24 positions shown; findings below may reference images not displayed]

FINDINGS: Right Kidney:

Renal measurements: 10.3 by 6.3 x 4.7 cm = volume: 157.6 mL.
Echogenicity within normal limits. No mass or hydronephrosis
visualized. Mild prominence of the right renal pelvis is
nonspecific.

Left Kidney:

Renal measurements: 10.2 x 6.1 by 4.9 cm = volume: 161.3 mL.
Echogenicity within normal limits. No mass or hydronephrosis
visualized.

Bladder:

Decompressed and not evaluated.

Other:

None.
IMPRESSION: 1. Mild distension of the right renal pelvis without frank
hydronephrosis. Otherwise unremarkable exam.

## 2020-05-14 MED ORDER — CHLORHEXIDINE GLUCONATE CLOTH 2 % EX PADS
6.0000 | MEDICATED_PAD | Freq: Every day | CUTANEOUS | Status: DC
  Administered 2020-05-14 – 2020-05-15 (×2): 6 via TOPICAL

## 2020-05-14 NOTE — Progress Notes (Signed)
[facility]  PROGRESS NOTE  Sheila Ray XNA:[id number] DOB: [birth year] DOA: 05/03/2020 PCP: Sheila Balloon, FNP   Brief HPI:   *Sheila Ray is an 79 y.o. 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  She did not receive IV TPA due to anticoagulation with Xarelto.  Patient was noted to have mild right leg drift, right facial droop, mild aphasia with CT scan finding consistent with left MCA infarct, patient is now status post thrombectomy.  She was also noted to have A. fib with RVR, cardiology was consulted and Xarelto was switched to Eliquis.  She was started on Cardizem and metoprolol for rate control.   Patient was supposed to be transferred to CIR today, however lab work revealed elevated BUN/creatinine 29/1.92, it was 18/0.82 on 05/09/2020.  CBC showed leukocytosis with WBC 19,000.  UA was clear, chest x-ray did not show significant infiltrate.    Subjective   Patient seen and examined, denies any complaints.  Bladder scan this morning showed greater than 700 cc urine.  Renal function little worse today.  Creatinine is up to 2.07.  Patient was started on IV normal saline at 100 mL/h.   Assessment/Plan:     1. Cellulitis-patient had left lower extremity cellulitis, she was started on Augmentin.  Skin erythema and warmth have improved.  WBC still elevated to 24,000.  Lactic acid was 1.1.  Continue with Augmentin at this time.  Vitals are stable. 2. Acute kidney injury-patient had 700 cc urine in the bladder, likely postobstructive AKI.  Will insert Foley catheter.  Will obtain renal ultrasound.  Continue with IV normal saline and follow renal function in a.m.  If no improvement in creatinine will consider nephrology consultation. 3. Left lower extremity edema-improved, patient  started on antibiotics as above.  Venous duplex of lower extremity is currently pending. 4. Hypotension-resolved, blood pressure has improved. 5. Atrial fibrillation-heart rate is controlled, continue Cardizem, metoprolol.  Continue anticoagulation with Eliquis. 6. Diabetes mellitus type 2-continue very sensitive sliding scale insulin.  CBG well controlled. 7. Left MCA infarct-management per neurology     COVID-19 Labs  No results for input(s): DDIMER, FERRITIN, LDH, CRP in the last 72 hours.  Lab Results  Component Value Date   [place] NEGATIVE 05/03/2020   [place] Not Detected 03/22/2020     Scheduled medications:   .  stroke: mapping our early stages of recovery book   Does not apply Once  . amoxicillin-clavulanate  1 tablet Oral Q12H  . apixaban  5 mg Oral BID  . diltiazem  60 mg Oral Q8H  . ezetimibe  10 mg Oral Daily  . insulin aspart  0-6 Units Subcutaneous TID AC & HS  . ipratropium  1 spray Each Nare BID  . mouth rinse  15 mL Mouth Rinse BID  . metoprolol succinate  100 mg Oral Daily  . nystatin   Topical BID         CBG: Recent Labs  Lab 05/13/20 1641 05/13/20 2056 05/13/20 2342 05/14/20 0644 05/14/20 1239  GLUCAP 106* 130* 132* 106* 166*    SpO2: 100 % O2 Flow Rate (L/min): 2 L/min    CBC: Recent Labs  Lab 05/08/20 0110 05/09/20 0353 05/13/20 0317 05/14/20 0111  WBC 13.1* 13.8* 19.4* 24.1*  HGB 10.5* 10.0* 10.4* 10.5*  HCT 34.6* 33.9* 34.1* 34.4*  MCV 76.2* 78.3* 75.8* 76.6*  PLT 405* 349 419* 448*    Basic Metabolic Panel: Recent Labs  Lab 05/08/20 0110 05/09/20 0353 05/13/20 0317 05/14/20 0111  NA 132* 135 130* 129*  K 3.6 3.6 4.1 4.1  CL 104 109 101 100  CO2 19* 15* 19* 18*  GLUCOSE 123* 115* 123* 131*  BUN 20 18 29* 32*  CREATININE 0.82 0.82 1.92* 2.07*  CALCIUM 8.7* 8.8* 8.6* 8.3*  MG  --   --   --  1.8     Liver Function Tests: No results for input(s): AST, ALT, ALKPHOS, BILITOT, PROT, ALBUMIN in the last  168 hours.   Antibiotics: Anti-infectives (From admission, onward)   Start     Dose/Rate Route Frequency Ordered Stop   05/13/20 1200  amoxicillin-clavulanate (AUGMENTIN) 500-125 MG per tablet 500 mg        1 tablet Oral Every 12 hours 05/13/20 1102     05/13/20 1145  amoxicillin-clavulanate (AUGMENTIN) 875-125 MG per tablet 1 tablet  Status:  Discontinued        1 tablet Oral Every 12 hours 05/13/20 1057 05/13/20 1101   05/03/20 1501  ceFAZolin (ANCEF) 2-4 GM/100ML-% IVPB       Note to Pharmacy: Sheila Ray   : cabinet override      05/03/20 1501 05/04/20 0314       DVT prophylaxis: Apixaban  Code Status: Full code  Family Communication: No family at bedside        Objective   Vitals:   05/13/20 2311 05/14/20 0456 05/14/20 1002 05/14/20 1236  BP: 126/76 (!) 146/77 126/68 126/80  Pulse: 79 98 91 91  Resp: 18 20  18   Temp: 97.9 F (36.6 C) (!) 97.5 F (36.4 C) 97.6 F (36.4 C) 97.9 F (36.6 C)  TempSrc: Oral Oral Oral Oral  SpO2: 97% 96% 98% 100%  Weight:      Height:        Intake/Output Summary (Last 24 hours) at 05/14/2020 1312 Last data filed at 05/14/2020 1200 Gross per 24 hour  Intake 420 ml  Output 400 ml  Net 20 ml    11/12 1901 - 11/14 0700 In: -  Out: 300 [Urine:300]  Filed Weights   05/06/20 1732  Weight: 79.4 kg    Physical Examination:   General-appears in no acute distress Heart-S1-S2, regular, no murmur auscultated Lungs-clear to auscultation bilaterally, no wheezing or crackles auscultated Abdomen-soft, nontender, no organomegaly Extremities-no edema in the lower extremities Neuro-alert, oriented x3, right hemiparesis Skin-no warmth, no erythema noted in left lower extremity      BNP (last 3 results) Recent Labs    12/28/19 1136  BNP 207.3*     Studies:  DG CHEST PORT 1 VIEW  Result Date: 05/12/2020 CLINICAL DATA:  Leukocytosis EXAM: PORTABLE CHEST 1 VIEW COMPARISON:  05/05/2020 FINDINGS: Single frontal view  of the chest demonstrates stable enlarged cardiac silhouette. Persistent central vascular congestion and small bilateral pleural effusions. Patchy areas of consolidation at the lung bases are unchanged. Slight improvement in the diffuse interstitial prominence and patchy ground-glass airspace disease seen previously. IMPRESSION: 1. Persistent but improving congestive heart failure. Superimposed infection cannot be completely excluded. Electronically Signed   By: Randa Ngo M.D.   On: 05/12/2020 21:15       [place]   [facility] If 7PM-7AM, please contact night-coverage at [email or web address], Office  [phone]   05/14/2020, 1:12 PM  LOS: 11 days

## 2020-05-14 NOTE — Progress Notes (Signed)
Bilateral lower extremity venous duplex has been completed. Preliminary results can be found in CV Proc through chart review.   05/14/20 3:05 PM Carlos Levering RVT

## 2020-05-14 NOTE — Progress Notes (Addendum)
STROKE TEAM PROGRESS NOTE   INTERVAL HISTORY RN and NT are at bedside. Pt is lying in bed, being fed by NT.  Patient seem to have good appetite at this time.  Still on IV fluid at 100 cc/h, however, creatinine continue to trending up from 1.92->2.07 today.  WBC trending up from 19.4->24.1 today.  Bladder scan showed 1800 cc urine, concerning for urinary retention induced renal failure.  Foley catheter placed.  OBJECTIVE Vitals:   05/13/20 1927 05/13/20 2058 05/13/20 2311 05/14/20 0456  BP: 111/64 (!) 120/59 126/76 (!) 146/77  Pulse: 64 88 79 98  Resp: 18 20 18 20   Temp: 98.2 F (36.8 C) 97.8 F (36.6 C) 97.9 F (36.6 C) (!) 97.5 F (36.4 C)  TempSrc: Oral Oral Oral Oral  SpO2: 98% 97% 97% 96%  Weight:      Height:       CBC:  Recent Labs  Lab 05/13/20 0317 05/14/20 0111  WBC 19.4* 24.1*  HGB 10.4* 10.5*  HCT 34.1* 34.4*  MCV 75.8* 76.6*  PLT 419* 790*   Basic Metabolic Panel:  Recent Labs  Lab 05/13/20 0317 05/14/20 0111  NA 130* 129*  K 4.1 4.1  CL 101 100  CO2 19* 18*  GLUCOSE 123* 131*  BUN 29* 32*  CREATININE 1.92* 2.07*  CALCIUM 8.6* 8.3*  MG  --  1.8   Lipid Panel:     Component Value Date/Time   CHOL 173 05/04/2020 0317   CHOL 226 (H) 09/16/2019 1136   CHOL 173 01/06/2013 1354   TRIG 65 05/04/2020 0317   TRIG 120 05/06/2013 1341   TRIG 95 01/06/2013 1354   HDL 36 (L) 05/04/2020 0317   HDL 43 09/16/2019 1136   HDL 47 05/06/2013 1341   HDL 41 01/06/2013 1354   CHOLHDL 4.8 05/04/2020 0317   VLDL 13 05/04/2020 0317   LDLCALC 124 (H) 05/04/2020 0317   LDLCALC 158 (H) 09/16/2019 1136   LDLCALC 109 (H) 05/06/2013 1341   LDLCALC 113 (H) 01/06/2013 1354   HgbA1c:  Lab Results  Component Value Date   HGBA1C 6.4 (H) 05/04/2020   Urine Drug Screen: No results found for: LABOPIA, COCAINSCRNUR, LABBENZ, AMPHETMU, THCU, LABBARB  Alcohol Level     Component Value Date/Time   ETH <10 05/03/2020 1317    IMAGING  CT HEAD CODE STROKE WO CONTRAST CT  Angio Head W or Wo Contrast CT Angio Neck W and/or Wo Contrast 05/03/2020    No acute intracranial hemorrhage. No acute infarction identified.   ASPECT score is 10.   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   No hemodynamically significant stenosis the neck.   Partially imaged mild interstitial pulmonary edema and small bilateral pleural effusions.  CT CEREBRAL PERFUSION W CONTRAST 05/03/2020 No core infarction identified. 67 mL of penumbra in the posterior left MCA territory.  Neuro Interventional Radiology - Cerebral Angiogram with Intervention - Dr Estanislado Pandy 05/03/2020 4:55 PM RT common carotid arteriogram followed by revascularization of occluded sup division M2 branch with x 2 passes with Contact aspiration using Zoom 35 and 55 aspiration catheters and partially of  the distal M3 region of inf division with mechanical thrombolysis achieving a TICI 2C revascularization. Recanalizing Rt ACA A2/A3 junction filling defect noted .  MR BRAIN WO CONTRAST 05/03/2020 1. 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 Associated minimal petechial hemorrhage at the left temporal region without significant mass effect.  2. Underlying age-related cerebral atrophy with moderate chronic microvascular ischemic disease.   MR ANGIO HEAD WO CONTRAST 05/03/2020 1. Technically limited exam due to motion artifact.  2. Previously identified occluded left M2 branch appears grossly patent status post revascularization.  3. Otherwise grossly stable and negative intracranial MRA. No other large vessel occlusion. No other hemodynamically significant or correctable stenosis.   DG CHEST PORT 1 VIEW 05/03/2020 Vascular congestion with mild parenchymal edema on the right consistent with congestive failure.  Transthoracic Echocardiogram  1. Left ventricular ejection fraction, by estimation, is 55 to 60%. The left  ventricle has normal function. The left ventricle has no regional wall motion abnormalities. There is moderate concentric left ventricular hypertrophy. Left ventricular diastolic function could not be evaluated.  2. Right ventricular systolic function is mildly reduced. The right ventricular size is moderately enlarged. There is moderately elevated pulmonary artery systolic pressure. The estimated right ventricular systolic pressure is 76.7 mmHg.  3. Left atrial size was moderately dilated.  4. Right atrial size was severely dilated.  5. A small pericardial effusion is present. The pericardial effusion is posterior to the left ventricle. Large pleural effusion in the left lateral region.  6. The mitral valve is normal in structure. Moderate mitral valve regurgitation. No evidence of mitral stenosis.  7. Tricuspid valve regurgitation is moderate to severe.  8. The aortic valve is normal in structure. Aortic valve regurgitation is trivial. No aortic stenosis is present.  9. The inferior vena cava is normal in size with greater than 50% respiratory variability, suggesting right atrial pressure of 3 mmHg.   ECG - atrial fibrillation - ventricular response 131 BPM (See cardiology reading for complete details)  EEG - This study is suggestive of cortical dysfunction in left hemisphere, maximal left temporal region consistent with underlying stroke.  No seizures or definite epileptiform discharges were seen throughout the recording.   PHYSICAL EXAM       Temp:  [97.5 F (36.4 C)-98.3 F (36.8 C)] 97.5 F (36.4 C) (11/14 0456) Pulse Rate:  [64-98] 98 (11/14 0456) Resp:  [18-20] 20 (11/14 0456) BP: (108-146)/(51-78) 146/77 (11/14 0456) SpO2:  [96 %-99 %] 96 % (11/14 0456)  General - Well nourished, well developed, mild SOB, lethargic.  Ophthalmologic - fundi not visualized due to noncooperation.  Cardiovascular - irregularly irregular heart rate and rhythm.  Neuro - awake, alert,  expressive aphasia but no dysarthria, able to follow some simple verbal commands, but still has slow response and perseveration, can repeat 3 words sentence, not able to name. Able to gaze bilaterally. Blinking to visual threat bilaterally. PERRL, no significant facial droop. Tongue protrusion midline. LUE strong and active, no drift. LLE 3/5. RUE 3+/5 with drift but not hitting bed in 10 sec. RLE mild withdraw to pain and can wiggle toes. Sensation subjectively symmetrical (but has aphasia), coordination not cooperative and gait not tested.    ASSESSMENT/PLAN Sheila Ray is a 79 y.o. 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 She did not receive IV t-PA due to anticoagulation with Xarelto. IR - Cerebral Angiogram with Intervention - 05/03/20 - revascularization of occluded sup division M2 branch.  Stroke: Lt MCA scattered infarcts due to left M2 occlusion s/p [id number] - embolic - likely from Afib even on Xarelto.  CT Head - No acute intracranial hemorrhage.  No acute infarction identified. ASPECT score is 10.      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 No hemodynamically significant stenosis the neck.   CT Perfusion - No core infarction identified. 67 mL of penumbra in the posterior left MCA territory.  MRI head x 2 - Patchy acute ischemic infarcts involving the MCA and ACA distributions, predominantly cortical in nature.    MRA head x 2 -  Previously identified occluded left M2 branch appears grossly patent status post revascularization.   EEG - left cortical dysfunction, no seizure  2D Echo - EF 55-60%  [employer] Virus 2 - negative  LDL - 124  HgbA1c - 6.4  VTE prophylaxis - [place] Heparin  Xarelto  (rivaroxaban) daily prior to admission, now on Eliquis due to Xarelto failure.  Therapy recommendations:  CIR   Disposition:  Pending  Afib RVR  On Xarelto PTA  Now on Eliquis  rate difficult to control - cardiology consulted, was on amio gtt, med adjustements  Now on metoprolol and Cardizem 90 qid->60 qid->60 tid   Change metoprolol to 100XL per cardiology recommendations  Rate controlled  Elevated troponin  Trop 68->63->73  Cardiology on board  Considering related to Afib instead of ischemia  Hx of hypertension Hypotension  Home BP meds: Toprol ; Zestril  Current BP meds: metoprolol 50 bid and diltiazem 60 tid    Change metoprolol to 100XL per cardiology recommendations  Blood pressure stable  . Long-term BP goal normotensive  Hyperlipidemia  Home Lipid lowering medication: none   LDL 124, goal < 70  Put on Zetia  Hx of statin intolerance (Lipitor ; Pravastatin ; Crestor)  Diabetes  Home diabetic meds: metformin  Current diabetic meds: SSI   HgbA1c 6.4, goal < 7.0  CBG monitoring  PCP follow up  AKI, likely post renal  Creatinine 0.82->1.92->2.07  Consulted hospitalist Dr. Darrick Meigs  On IV fluid  Large urinary retention s/p foley  Renal US no hydrohephrosis  Leukocytosis   WBC's -  13.4->15.6->19.2->16.2->11.1->8.7->13.1->19.4->24.1  Dr. Darrick Meigs on board  On augmentin   CBC monitoring  Other Stroke Risk Factors  Advanced age  Obesity, recommend weight loss, diet and exercise as appropriate   Hx stroke/TIA - left thalamic stroke (no details)  Other Active Problems  Hyponatremia - Na - 129 ->130->132->129  Hospital day # 11  Patient had significant AKI and leukocytosis, found to have large urinary retention, status post Foley catheter. I spent  35 minutes in total face-to-face time with the patient, more than 50% of which was spent in counseling and coordination of care, reviewing test results, images and medication, and  discussing the diagnosis, treatment plan and potential prognosis. This patient's care requiresreview of multiple databases, neurological assessment, discussion with family, other specialists and medical decision making of high complexity.  Rosalin Hawking, MD PhD Stroke Neurology 05/14/2020 6:26 PM  To contact Stroke Continuity provider, please refer to [email or web address] After hours, contact General Neurology

## 2020-05-15 ENCOUNTER — Other Ambulatory Visit: Payer: Self-pay | Admitting: Neurology

## 2020-05-15 ENCOUNTER — Other Ambulatory Visit: Payer: Self-pay

## 2020-05-15 ENCOUNTER — Inpatient Hospital Stay (HOSPITAL_COMMUNITY): Payer: Medicare Other | Admitting: Occupational Therapy

## 2020-05-15 ENCOUNTER — Inpatient Hospital Stay (HOSPITAL_COMMUNITY)
Admission: RE | Admit: 2020-05-15 | Discharge: 2020-06-06 | DRG: 057 | Disposition: A | Discharge status: Home Health | Payer: Medicare Other | Source: Intra-hospital | Attending: Physical Medicine & Rehabilitation | Admitting: Physical Medicine & Rehabilitation

## 2020-05-15 ENCOUNTER — Inpatient Hospital Stay (HOSPITAL_COMMUNITY): Payer: Medicare Other

## 2020-05-15 ENCOUNTER — Encounter (HOSPITAL_COMMUNITY): Payer: Self-pay | Admitting: Physical Medicine & Rehabilitation

## 2020-05-15 DIAGNOSIS — I6931 Attention and concentration deficit following cerebral infarction: Secondary | ICD-10-CM

## 2020-05-15 DIAGNOSIS — Z96651 Presence of right artificial knee joint: Secondary | ICD-10-CM | POA: Diagnosis not present

## 2020-05-15 DIAGNOSIS — R739 Hyperglycemia, unspecified: Secondary | ICD-10-CM

## 2020-05-15 DIAGNOSIS — L03116 Cellulitis of left lower limb: Secondary | ICD-10-CM | POA: Diagnosis not present

## 2020-05-15 DIAGNOSIS — Z803 Family history of malignant neoplasm of breast: Secondary | ICD-10-CM

## 2020-05-15 DIAGNOSIS — R0602 Shortness of breath: Secondary | ICD-10-CM | POA: Diagnosis not present

## 2020-05-15 DIAGNOSIS — Z7901 Long term (current) use of anticoagulants: Secondary | ICD-10-CM

## 2020-05-15 DIAGNOSIS — Z881 Allergy status to other antibiotic agents status: Secondary | ICD-10-CM

## 2020-05-15 DIAGNOSIS — E876 Hypokalemia: Secondary | ICD-10-CM | POA: Diagnosis present

## 2020-05-15 DIAGNOSIS — Z23 Encounter for immunization: Secondary | ICD-10-CM | POA: Diagnosis not present

## 2020-05-15 DIAGNOSIS — Z8679 Personal history of other diseases of the circulatory system: Secondary | ICD-10-CM

## 2020-05-15 DIAGNOSIS — I10 Essential (primary) hypertension: Secondary | ICD-10-CM

## 2020-05-15 DIAGNOSIS — I5032 Chronic diastolic (congestive) heart failure: Secondary | ICD-10-CM | POA: Diagnosis present

## 2020-05-15 DIAGNOSIS — K5901 Slow transit constipation: Secondary | ICD-10-CM

## 2020-05-15 DIAGNOSIS — I110 Hypertensive heart disease with heart failure: Secondary | ICD-10-CM | POA: Diagnosis not present

## 2020-05-15 DIAGNOSIS — E785 Hyperlipidemia, unspecified: Secondary | ICD-10-CM | POA: Diagnosis present

## 2020-05-15 DIAGNOSIS — M8580 Other specified disorders of bone density and structure, unspecified site: Secondary | ICD-10-CM | POA: Diagnosis present

## 2020-05-15 DIAGNOSIS — Z20822 Contact with and (suspected) exposure to covid-19: Secondary | ICD-10-CM | POA: Diagnosis not present

## 2020-05-15 DIAGNOSIS — R079 Chest pain, unspecified: Secondary | ICD-10-CM | POA: Diagnosis not present

## 2020-05-15 DIAGNOSIS — Z79899 Other long term (current) drug therapy: Secondary | ICD-10-CM | POA: Diagnosis not present

## 2020-05-15 DIAGNOSIS — I4821 Permanent atrial fibrillation: Secondary | ICD-10-CM | POA: Diagnosis not present

## 2020-05-15 DIAGNOSIS — I517 Cardiomegaly: Secondary | ICD-10-CM | POA: Diagnosis not present

## 2020-05-15 DIAGNOSIS — E871 Hypo-osmolality and hyponatremia: Secondary | ICD-10-CM

## 2020-05-15 DIAGNOSIS — Z7983 Long term (current) use of bisphosphonates: Secondary | ICD-10-CM | POA: Diagnosis not present

## 2020-05-15 DIAGNOSIS — I69392 Facial weakness following cerebral infarction: Secondary | ICD-10-CM

## 2020-05-15 DIAGNOSIS — N390 Urinary tract infection, site not specified: Secondary | ICD-10-CM | POA: Diagnosis not present

## 2020-05-15 DIAGNOSIS — E039 Hypothyroidism, unspecified: Secondary | ICD-10-CM | POA: Diagnosis not present

## 2020-05-15 DIAGNOSIS — N179 Acute kidney failure, unspecified: Secondary | ICD-10-CM | POA: Diagnosis not present

## 2020-05-15 DIAGNOSIS — Z8261 Family history of arthritis: Secondary | ICD-10-CM

## 2020-05-15 DIAGNOSIS — E1165 Type 2 diabetes mellitus with hyperglycemia: Secondary | ICD-10-CM | POA: Diagnosis not present

## 2020-05-15 DIAGNOSIS — I69320 Aphasia following cerebral infarction: Secondary | ICD-10-CM | POA: Diagnosis not present

## 2020-05-15 DIAGNOSIS — B965 Pseudomonas (aeruginosa) (mallei) (pseudomallei) as the cause of diseases classified elsewhere: Secondary | ICD-10-CM | POA: Diagnosis not present

## 2020-05-15 DIAGNOSIS — Z8262 Family history of osteoporosis: Secondary | ICD-10-CM

## 2020-05-15 DIAGNOSIS — R600 Localized edema: Secondary | ICD-10-CM | POA: Diagnosis not present

## 2020-05-15 DIAGNOSIS — I6602 Occlusion and stenosis of left middle cerebral artery: Secondary | ICD-10-CM | POA: Diagnosis not present

## 2020-05-15 DIAGNOSIS — G8191 Hemiplegia, unspecified affecting right dominant side: Secondary | ICD-10-CM | POA: Diagnosis not present

## 2020-05-15 DIAGNOSIS — N319 Neuromuscular dysfunction of bladder, unspecified: Secondary | ICD-10-CM

## 2020-05-15 DIAGNOSIS — Z8249 Family history of ischemic heart disease and other diseases of the circulatory system: Secondary | ICD-10-CM

## 2020-05-15 DIAGNOSIS — J9811 Atelectasis: Secondary | ICD-10-CM | POA: Diagnosis not present

## 2020-05-15 DIAGNOSIS — I69351 Hemiplegia and hemiparesis following cerebral infarction affecting right dominant side: Secondary | ICD-10-CM | POA: Diagnosis not present

## 2020-05-15 DIAGNOSIS — Z7984 Long term (current) use of oral hypoglycemic drugs: Secondary | ICD-10-CM

## 2020-05-15 DIAGNOSIS — I639 Cerebral infarction, unspecified: Secondary | ICD-10-CM | POA: Diagnosis not present

## 2020-05-15 DIAGNOSIS — I4891 Unspecified atrial fibrillation: Secondary | ICD-10-CM

## 2020-05-15 DIAGNOSIS — E119 Type 2 diabetes mellitus without complications: Secondary | ICD-10-CM

## 2020-05-15 DIAGNOSIS — G9349 Other encephalopathy: Secondary | ICD-10-CM | POA: Diagnosis not present

## 2020-05-15 DIAGNOSIS — I63512 Cerebral infarction due to unspecified occlusion or stenosis of left middle cerebral artery: Secondary | ICD-10-CM | POA: Diagnosis present

## 2020-05-15 DIAGNOSIS — J90 Pleural effusion, not elsewhere classified: Secondary | ICD-10-CM | POA: Diagnosis not present

## 2020-05-15 DIAGNOSIS — Z820 Family history of epilepsy and other diseases of the nervous system: Secondary | ICD-10-CM

## 2020-05-15 DIAGNOSIS — I63412 Cerebral infarction due to embolism of left middle cerebral artery: Secondary | ICD-10-CM | POA: Diagnosis not present

## 2020-05-15 DIAGNOSIS — I071 Rheumatic tricuspid insufficiency: Secondary | ICD-10-CM | POA: Diagnosis present

## 2020-05-15 LAB — CBC WITH DIFFERENTIAL/PLATELET
Abs Immature Granulocytes: 0.12 10*3/uL — ABNORMAL HIGH (ref 0.00–0.07)
Basophils Absolute: 0 10*3/uL (ref 0.0–0.1)
Basophils Relative: 0 %
Eosinophils Absolute: 0.2 10*3/uL (ref 0.0–0.5)
Eosinophils Relative: 1 %
HCT: 28.7 % — ABNORMAL LOW (ref 36.0–46.0)
Hemoglobin: 8.9 g/dL — ABNORMAL LOW (ref 12.0–15.0)
Immature Granulocytes: 1 %
Lymphocytes Relative: 7 %
Lymphs Abs: 1.1 10*3/uL (ref 0.7–4.0)
MCH: 23.6 pg — ABNORMAL LOW (ref 26.0–34.0)
MCHC: 31 g/dL (ref 30.0–36.0)
MCV: 76.1 fL — ABNORMAL LOW (ref 80.0–100.0)
Monocytes Absolute: 1.9 10*3/uL — ABNORMAL HIGH (ref 0.1–1.0)
Monocytes Relative: 12 %
Neutro Abs: 12.5 10*3/uL — ABNORMAL HIGH (ref 1.7–7.7)
Neutrophils Relative %: 79 %
Platelets: 341 10*3/uL (ref 150–400)
RBC: 3.77 MIL/uL — ABNORMAL LOW (ref 3.87–5.11)
RDW: 16.8 % — ABNORMAL HIGH (ref 11.5–15.5)
WBC: 16 10*3/uL — ABNORMAL HIGH (ref 4.0–10.5)
nRBC: 0 % (ref 0.0–0.2)

## 2020-05-15 LAB — GLUCOSE, CAPILLARY
Glucose-Capillary: 93 mg/dL (ref 70–99)
Glucose-Capillary: 98 mg/dL (ref 70–99)
Glucose-Capillary: 99 mg/dL (ref 70–99)
Glucose-Capillary: 106 mg/dL — ABNORMAL HIGH (ref 70–99)

## 2020-05-15 LAB — BASIC METABOLIC PANEL
Anion gap: 6 (ref 5–15)
BUN: 19 mg/dL (ref 8–23)
CO2: 20 mmol/L — ABNORMAL LOW (ref 22–32)
Calcium: 8 mg/dL — ABNORMAL LOW (ref 8.9–10.3)
Chloride: 110 mmol/L (ref 98–111)
Creatinine, Ser: 1.14 mg/dL — ABNORMAL HIGH (ref 0.44–1.00)
GFR, Estimated: 49 mL/min — ABNORMAL LOW (ref >60)
Glucose, Bld: 101 mg/dL — ABNORMAL HIGH (ref 70–99)
Potassium: 3.5 mmol/L (ref 3.5–5.1)
Sodium: 136 mmol/L (ref 135–145)

## 2020-05-15 MED ORDER — NYSTATIN 100000 UNIT/GM EX POWD
1.0000 "application " | Freq: Two times a day (BID) | CUTANEOUS | Status: DC
  Administered 2020-05-15 – 2020-06-06 (×43): 1 via TOPICAL
  Filled 2020-05-15: qty 15

## 2020-05-15 MED ORDER — DILTIAZEM HCL 60 MG PO TABS
60.0000 mg | ORAL_TABLET | Freq: Three times a day (TID) | ORAL | Status: DC
  Administered 2020-05-15 – 2020-06-06 (×62): 60 mg via ORAL
  Filled 2020-05-15 (×65): qty 1

## 2020-05-15 MED ORDER — INFLUENZA VAC A&B SA ADJ QUAD 0.5 ML IM PRSY
0.5000 mL | PREFILLED_SYRINGE | INTRAMUSCULAR | Status: AC
  Administered 2020-05-16: 0.5 mL via INTRAMUSCULAR
  Filled 2020-05-15: qty 0.5

## 2020-05-15 MED ORDER — EZETIMIBE 10 MG PO TABS
10.0000 mg | ORAL_TABLET | Freq: Every day | ORAL | Status: DC
  Administered 2020-05-16 – 2020-06-06 (×22): 10 mg via ORAL
  Filled 2020-05-15 (×22): qty 1

## 2020-05-15 MED ORDER — ACETAMINOPHEN 160 MG/5ML PO SOLN: 650.0000 mg | ORAL | Status: DC | PRN

## 2020-05-15 MED ORDER — INSULIN ASPART 100 UNIT/ML ~~LOC~~ SOLN
0.0000 [IU] | Freq: Three times a day (TID) | SUBCUTANEOUS | Status: DC
  Administered 2020-05-25 – 2020-06-02 (×2): 1 [IU] via SUBCUTANEOUS

## 2020-05-15 MED ORDER — ACETAMINOPHEN 650 MG RE SUPP: 650.0000 mg | RECTAL | Status: DC | PRN

## 2020-05-15 MED ORDER — ACETAMINOPHEN 325 MG PO TABS
650.0000 mg | ORAL_TABLET | ORAL | Status: DC | PRN
  Administered 2020-05-15 – 2020-06-03 (×12): 650 mg via ORAL
  Filled 2020-05-15 (×15): qty 2

## 2020-05-15 MED ORDER — AMOXICILLIN-POT CLAVULANATE 875-125 MG PO TABS
1.0000 | ORAL_TABLET | Freq: Two times a day (BID) | ORAL | Status: AC
  Administered 2020-05-15 – 2020-05-20 (×10): 1 via ORAL
  Filled 2020-05-15 (×10): qty 1

## 2020-05-15 MED ORDER — SODIUM CHLORIDE 0.9% FLUSH
3.0000 mL | Freq: Two times a day (BID) | INTRAVENOUS | Status: DC
  Administered 2020-05-15: 3 mL via INTRAVENOUS

## 2020-05-15 MED ORDER — IPRATROPIUM BROMIDE 0.06 % NA SOLN
1.0000 | Freq: Two times a day (BID) | NASAL | Status: DC
  Administered 2020-05-15 – 2020-06-06 (×39): 1 via NASAL
  Filled 2020-05-15 (×3): qty 15

## 2020-05-15 MED ORDER — SENNOSIDES-DOCUSATE SODIUM 8.6-50 MG PO TABS
1.0000 | ORAL_TABLET | Freq: Every evening | ORAL | Status: DC | PRN
  Administered 2020-05-26 – 2020-05-28 (×2): 1 via ORAL
  Filled 2020-05-15 (×2): qty 1

## 2020-05-15 MED ORDER — METOPROLOL SUCCINATE ER 50 MG PO TB24
100.0000 mg | ORAL_TABLET | Freq: Every day | ORAL | Status: DC
  Administered 2020-05-16 – 2020-06-05 (×20): 100 mg via ORAL
  Filled 2020-05-15 (×23): qty 2

## 2020-05-15 MED ORDER — AMOXICILLIN-POT CLAVULANATE 875-125 MG PO TABS
1.0000 | ORAL_TABLET | Freq: Two times a day (BID) | ORAL | Status: DC
  Administered 2020-05-15: 1 via ORAL
  Filled 2020-05-15: qty 1

## 2020-05-15 MED ORDER — APIXABAN 5 MG PO TABS
5.0000 mg | ORAL_TABLET | Freq: Two times a day (BID) | ORAL | Status: DC
  Administered 2020-05-15 – 2020-06-06 (×43): 5 mg via ORAL
  Filled 2020-05-15 (×42): qty 1
  Filled 2020-05-15: qty 2

## 2020-05-15 NOTE — Progress Notes (Signed)
Patient used call bell to say she was having a heart attack. Pointed to area under her left breast indicating pain. Vital signs WNL, PA notified and EKG performed, results shared. REported off to night shift. Patient resting comfortably in no distress

## 2020-05-15 NOTE — Progress Notes (Signed)
ANTICOAGULATION CONSULT NOTE - Follow up  Pharmacy Consult for Apixaban  Indication: atrial fibrillation  Patient Measurements: Height: 5' 4.5" (163.8 cm) Weight: 79.4 kg (175 lb 0.7 oz) IBW/kg (Calculated) : 55.85  Vital Signs: Temp: 97.4 F (36.3 C) (11/15 0439) Temp Source: Oral (11/15 0439) BP: 108/64 (11/15 0439) Pulse Rate: 72 (11/15 0439)  Labs: Recent Labs    05/13/20 0317 05/13/20 0317 05/14/20 0111 05/15/20 0406  HGB 10.4*   < > 10.5* 8.9*  HCT 34.1*  --  34.4* 28.7*  PLT 419*  --  448* 341  CREATININE 1.92*  --  2.07* 1.14*   < > = values in this interval not displayed.    Estimated Creatinine Clearance: 41.3 mL/min (A) (by C-G formula based on SCr of 1.14 mg/dL (H)).  Assessment: 79 yr old female with hx of atrial fibrillation (on rivaroxaban, last dose taken 05/02/20 AM), DM, HLD, HTN, hypothyroidism, prior L thalamic stroke without residual deficits, admitted 00/9/38 with embolic stroke, despite rivaroxaban therapy PTA. Pharmacy  Consulted 11/8 to dose apixaban for non-valvular atrial fibrillation.   Resolving AKI noted with SCr down to 1.14. Apixaban dosing remains appropriate given age<80 and wt>60kg. Hgb drop to 8.9 this AM - discussed with RN - was noted to have some recent hematuria, now resolved. No active bleeding noted - could be dilutional with recent IVF, will watch.   Education done with son Elta [place]) via phone on 11/10  Goal of Therapy:  Prevention of stroke secondary to atrial fibrillation Monitor platelets by anticoagulation protocol: Yes   Plan:  Continue Apixaban 5 mg PO BID  Pharmacy will sign off of consult and continue to monitor peripherally  Thank you for allowing pharmacy to be a part of this patient's care.  Alycia Rossetti, PharmD, BCPS Clinical Pharmacist Clinical phone for 05/15/2020: [phone] 05/15/2020 8:25 AM   **Pharmacist phone directory can now be found on [email or web address] ([email or web address]).  Listed under [place].

## 2020-05-15 NOTE — Progress Notes (Signed)
Courtney Heys, MD  Physician  Physical Medicine and Rehabilitation  Consult Note     Signed  Date of Service:  05/09/2020  8:49 AM      Related encounter: ED to Hosp-Admission (Current) from 05/03/2020 in [facility]      Signed      Expand All Collapse All  Show:Clear all [x] Manual[x] Template[] Copied  Added by: [x] Angiulli, Lavon Paganini, PA-C[x] Lovorn, Jinny Blossom, MD  [] Hover for details          Physical Medicine and Rehabilitation Consult Reason for Consult: Right side weakness and facial droop with mild expressive aphasia Referring Physician: Dr. Leonie Man     HPI: Sheila Ray is a 79 y.o. 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 Per chart review patient lives alone reportedly independent driving. One level home three steps to entry. Presented 05/03/2020 with right side weakness facial droop expressive aphasia and altered mental status. Her heart rate was noted to be in the one forties. Cranial CT scan showed no acute intracranial hemorrhage no acute infarction. 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 No hemodynamically significant stenosis in the neck. Patient underwent revascularization of occluded superior division M2 branch per interventional radiology. MRI of the brain showed cortical restricted diffusion within the left temporal occipital region. Scattered foci of restricted diffusion also seen in the left frontal parietal and insular regions and left amygdala. MRA with no proximal occlusion or stenosis. EEG negative for seizure. Echocardiogram with ejection fraction 55 to 60% no wall motion abnormalities. Neurology follow-up as well as cardiology services patient Xarelto was changed to Eliquis. She remains on Cardizem as well as metoprolol for her A. fib.  Tolerating a regular diet. Therapy evaluations completed with recommendations of physical medicine rehab consult.     Pt reports hard/frequent voiding- sometimes burns     Review of Systems  Constitutional: Positive for malaise/fatigue. Negative for chills and fever.  HENT: Negative for hearing loss.   Eyes: Negative for blurred vision and double vision.  Respiratory: Negative for cough and shortness of breath.   Cardiovascular: Positive for palpitations and leg swelling. Negative for chest pain.  Gastrointestinal: Positive for constipation. Negative for heartburn, nausea and vomiting.  Genitourinary: Negative for dysuria, flank pain and hematuria.  Musculoskeletal: Positive for myalgias.  Skin: Negative for rash.  All other systems reviewed and are negative.       Past Medical History:  Diagnosis Date  . Arthritis    . Atrial fibrillation ([place])    . Complication of anesthesia    . Diabetes mellitus without complication ([place])    . Diverticulosis    . Fatigue    . Gastric polyp    . Goiter    . Hyperlipidemia    . Hypertension    . Hypothyroid      taken off of thyroid medication 2012014   . Obese    . Osteopenia    . PONV (postoperative nausea and vomiting)    . Postmenopausal    . Rosacea    . Stroke ([place]) 01/15/2013    left thalamic  stroke, small vessel  . Vitamin D deficiency           Past Surgical History:  Procedure Laterality Date  . ABDOMINAL HYSTERECTOMY   1985  . FOOT SURGERY Right 08/2002  . IR CT HEAD LTD   05/03/2020  .  IR PERCUTANEOUS ART THROMBECTOMY/INFUSION INTRACRANIAL INC DIAG ANGIO   05/03/2020  . RADIOLOGY WITH ANESTHESIA N/A 05/03/2020    Procedure: IR WITH ANESTHESIA;  Surgeon: Radiologist, Medication, MD;  Location: [facility];  Service: Radiology;  Laterality: N/A;  . right knee arthroscopy    2013   . TONSILLECTOMY      . 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    Procedure: RIGHT TOTAL KNEE ARTHROPLASTY;  Surgeon: Gearlean Alf, MD;  Location:  [facility];  Service: Orthopedics;  Laterality: Right;         Family History  Problem Relation Age of Onset  . Osteoporosis Mother    . Alzheimer's disease Mother 17  . Arthritis Mother    . Cancer Father          Lung  . Arthritis Sister    . Obesity Sister    . Heart attack Brother 6  . Heart disease Son    . Arthritis Brother    . Arthritis Sister    . Cancer Sister          breast cancer    Social History:  reports that she has never smoked. She has never used smokeless tobacco. She reports that she does not drink alcohol and does not use drugs. Allergies:       Allergies  Allergen Reactions  . Crestor [Rosuvastatin] Other (See Comments)      Cramps  . Lipitor [Atorvastatin] Other (See Comments)      Cramps  . Pravastatin Other (See Comments)      Cramps   . Keflex [Cephalexin] Hives          Medications Prior to Admission  Medication Sig Dispense Refill  . alendronate (FOSAMAX) 70 MG tablet TAKE 1 TABLET BY MOUTH  WEEKLY AS DIRECTED (Patient taking differently: Take 70 mg by mouth once a week. ) 12 tablet 3  . Cyanocobalamin (VITAMIN B12) 1000 MCG TBCR Take 1,000 mcg by mouth daily.       Marland Kitchen lisinopril (ZESTRIL) 40 MG tablet TAKE 1 TABLET BY MOUTH  DAILY (Patient taking differently: Take 40 mg by mouth daily. ) 90 tablet 3  . Magnesium Oxide (MAG-OX 400 PO) Take 400 mg by mouth daily.      . metFORMIN (GLUCOPHAGE) 1000 MG tablet Take 0.5 tablets (500 mg total) by mouth daily with breakfast. 90 tablet 0  . metoprolol succinate (TOPROL-XL) 50 MG 24 hr tablet TAKE 1 TABLET BY MOUTH  DAILY WITH OR IMMEDIATELY  FOLLOWING A MEAL (Patient taking differently: Take 37.5 mg by mouth in the morning and at bedtime. ) 90 tablet 0  . XARELTO 20 MG TABS tablet TAKE 1 TABLET BY MOUTH  DAILY (Patient taking differently: Take 20 mg by mouth daily with supper. ) 90 tablet 0  . diltiazem (CARDIZEM CD) 240 MG 24 hr capsule TAKE 1 CAPSULE BY MOUTH  DAILY (Patient taking differently: Take 240 mg  by mouth daily. ) 90 capsule 3  . glucose blood (ONE TOUCH ULTRA TEST) test strip USE TO CHECK BLOOD GLUCOSE  ONCE A DAY AS INSTRUCTED 100 each 6  . ONETOUCH DELICA LANCETS 49S MISC 1 each by Does not apply route daily. Use to check BG daily 100 each 2      Home: Home Living Family/patient expects to be discharged to:: Private residence Living Arrangements: Alone Type of Home: House Home Access: Stairs to enter [employer] of Steps: 3 Entrance Stairs-Rails: Right, Left Home Layout: One level Bathroom Shower/Tub:  Walk-in shower Bathroom Toilet: Standard Home Equipment: None Additional Comments: per chart review  Lives With: Alone  Functional History: Prior Function Level of Independence: Independent Comments: drove, I in ADL's and iADL's Functional Status:  Mobility: Bed Mobility Overal bed mobility: Needs Assistance Bed Mobility: Supine to Sit, Sit to Sidelying, Rolling Rolling: Max assist, +2 for physical assistance Supine to sit: Total assist, +2 for physical assistance Sit to supine: Total assist, +2 for physical assistance Sit to sidelying: Total assist, +2 for physical assistance, +2 for safety/equipment General bed mobility comments: assist with all aspects of mobility Transfers Overall transfer level: Needs assistance Equipment used: 2 person hand held assist Transfers: Sit to/from Stand Sit to Stand: +2 physical assistance, Total assist General transfer comment: unable to attain full upright stance, clearing bottom from bed but bracing BLE on bed. Attempted x 2. Ambulation/Gait General Gait Details: unable   ADL: ADL Overall ADL's : Needs assistance/impaired Grooming: Maximal assistance, Sitting Grooming Details (indicate cue type and reason): hand over hand L UE to comb hair, unable to maintain task after initation without support Upper Body Bathing: Maximal assistance, Sitting Lower Body Bathing: Total assistance, +2 for physical assistance, +2 for  safety/equipment, Sit to/from stand Upper Body Dressing : Total assistance, Sitting Lower Body Dressing: Total assistance, +2 for physical assistance, +2 for safety/equipment, Sit to/from stand Toilet Transfer Details (indicate cue type and reason): deferred  Functional mobility during ADLs: +2 for physical assistance, Total assistance, +2 for safety/equipment General ADL Comments: pt limited by impaired cognition, anxiety, weakness, impaired commuincation    Cognition: Cognition Overall Cognitive Status: Difficult to assess Orientation Level: Oriented to person, Oriented to place Cognition Arousal/Alertness: Lethargic Behavior During Therapy: Flat affect, Anxious Overall Cognitive Status: Difficult to assess Area of Impairment: Awareness, Problem solving, Following commands Following Commands: Follows one step commands inconsistently, Follows one step commands with increased time Awareness: Intellectual Problem Solving: Slow processing, Decreased initiation, Difficulty sequencing, Requires verbal cues, Requires tactile cues General Comments: patient with slow processing, requires increased time to follow simple 1 step commands and is very inconsistent Difficult to assess due to: Impaired communication   Blood pressure 134/75, pulse 64, temperature 97.6 F (36.4 C), temperature source Axillary, resp. rate 19, height 5' 4.5" (1.638 m), weight 79.4 kg, SpO2 96 %. Physical Exam Vitals and nursing note reviewed.  Constitutional:      Comments: Pt is sitting up in bed- finished 50% lunch- not watching tv, just sitting there, NAD  HENT:     Head: Normocephalic and atraumatic.     Comments: On O2 1L by [place]- humidified Smile equal- tongue red/rough- midline    Right Ear: External ear normal.     Left Ear: External ear normal.     Nose: Nose normal. No congestion.     Mouth/Throat:     Mouth: Mucous membranes are dry.     Pharynx: Oropharynx is clear. No oropharyngeal exudate.  Eyes:      General:        Right eye: No discharge.        Left eye: No discharge.     Extraocular Movements: Extraocular movements intact.  Cardiovascular:     Rate and Rhythm: Normal rate and regular rhythm.  Pulmonary:     Comments: Somewhat wheezy- a few rhonchi also heard- good air movement B/L Abdominal:     Comments: Soft, NT, ND, (+)BS    Genitourinary:    Comments: Has purewick Musculoskeletal:     Cervical back: Normal  range of motion. No rigidity.     Comments: Hard ot get pt to follow/finish commands RUE- 2/5 grip- but wouldn't/couldn't move otherwise LUE at least 4/5- but hard to assess RLE- 2-/5 in HF, KE, DF and PF LLE_ at least 4/5- needs reminders to use RLE   Skin:    Comments: IV L forearm- looks OK  Neurological:     Comments: Patient is alert in no acute distress makes eye contact with examiner. Follows simple commands. Mild left gaze preference. She does exhibit some expressive aphasia. Pt very aphasic- can speak at phrase level- but made a lot of mistakes/word substitution Said light touch intact x 4  Psychiatric:     Comments: Sad affect        Lab Results Last 24 Hours       Results for orders placed or performed during the hospital encounter of 05/03/20 (from the past 24 hour(s))  Glucose, capillary     Status: Abnormal    Collection Time: 05/08/20 11:48 AM  Result Value Ref Range    Glucose-Capillary 129 (H) 70 - 99 mg/dL  Glucose, capillary     Status: Abnormal    Collection Time: 05/08/20 12:11 PM  Result Value Ref Range    Glucose-Capillary 109 (H) 70 - 99 mg/dL    Comment 1 Notify RN      Comment 2 Document in Chart    Glucose, capillary     Status: Abnormal    Collection Time: 05/08/20  4:53 PM  Result Value Ref Range    Glucose-Capillary 123 (H) 70 - 99 mg/dL    Comment 1 Notify RN      Comment 2 Document in Chart    Glucose, capillary     Status: Abnormal    Collection Time: 05/08/20  9:10 PM  Result Value Ref Range    Glucose-Capillary 138  (H) 70 - 99 mg/dL    Comment 1 Notify RN      Comment 2 Document in Chart    CBC     Status: Abnormal    Collection Time: 05/09/20  3:53 AM  Result Value Ref Range    WBC 13.8 (H) 4.0 - 10.5 K/uL    RBC 4.33 3.87 - 5.11 MIL/uL    Hemoglobin 10.0 (L) 12.0 - 15.0 g/dL    HCT 33.9 (L) 36 - 46 %    MCV 78.3 (L) 80.0 - 100.0 fL    MCH 23.1 (L) 26.0 - 34.0 pg    MCHC 29.5 (L) 30.0 - 36.0 g/dL    RDW 16.8 (H) 11.5 - 15.5 %    Platelets 349 150 - 400 K/uL    nRBC 0.0 0.0 - 0.2 %  Basic metabolic panel     Status: Abnormal    Collection Time: 05/09/20  3:53 AM  Result Value Ref Range    Sodium 135 135 - 145 mmol/L    Potassium 3.6 3.5 - 5.1 mmol/L    Chloride 109 98 - 111 mmol/L    CO2 15 (L) 22 - 32 mmol/L    Glucose, Bld 115 (H) 70 - 99 mg/dL    BUN 18 8 - 23 mg/dL    Creatinine, Ser 0.82 0.44 - 1.00 mg/dL    Calcium 8.8 (L) 8.9 - 10.3 mg/dL    GFR, Estimated >60 >60 mL/min    Anion gap 11 5 - 15  Glucose, capillary     Status: Abnormal    Collection Time: 05/09/20  6:19  AM  Result Value Ref Range    Glucose-Capillary 122 (H) 70 - 99 mg/dL    Comment 1 Notify RN      Comment 2 Document in Chart        Imaging Results (Last 48 hours)  No results found.       Assessment/Plan: 1. Diagnosis: L MCA/ACA with R hemiparesis 2. Does the need for close, 24 hr/day medical supervision in concert with the patient's rehab needs make it unreasonable for this patient to be served in a less intensive setting? Yes 3. Co-Morbidities requiring supervision/potential complications: afib, previous infarct, CHF, HTN, hypothyroidism- no meds 4. Due to bladder management, bowel management, safety, skin/wound care, disease management, medication administration, pain management and patient education, does the patient require 24 hr/day rehab nursing? Yes 5. Does the patient require coordinated care of a physician, rehab nurse, therapy disciplines of PT, OT and SLP to address physical and functional  deficits in the context of the above medical diagnosis(es)? Yes Addressing deficits in the following areas: balance, endurance, locomotion, strength, transferring, bowel/bladder control, bathing, dressing, feeding, grooming, toileting, cognition, speech and language 6. Can the patient actively participate in an intensive therapy program of at least 3 hrs of therapy per day at least 5 days per week? Yes 7. The potential for patient to make measurable gains while on inpatient rehab is good 8. Anticipated functional outcomes upon discharge from inpatient rehab are supervision and min assist  with PT, supervision and min assist with OT, supervision and min assist with SLP. 9. Estimated rehab length of stay to reach the above functional goals is: 2-3 weeks 10. Anticipated discharge destination: Home 11. Overall Rehab/Functional Prognosis: good   RECOMMENDATIONS: This patient's condition is appropriate for continued rehabilitative care in the following setting: CIR Patient has agreed to participate in recommended program. Potentially Note that insurance prior authorization may be required for reimbursement for recommended care.   Comment:  1. Suggest checking for UTI- pt c/o Sx's with specific questioning.  2. If not, pt's WBC is elevated- please determine cause 3. Will submit for insurance for inpt rehab- CIR 4. Thank you for this consult.          Cathlyn Parsons, PA-C 05/09/2020        Revision History                     Routing History                Note Details  Jan Fireman, MD File Time 05/09/2020  7:29 PM  Author Type Physician Status Signed  Last Editor Courtney Heys, MD Service Physical Medicine and Rehabilitation

## 2020-05-15 NOTE — Progress Notes (Signed)
Pt resting in bed awake and alert. Pt has expressive aphasia but is able to make needs known. Pt has some minor frustration over inability to communicate clearly secondary to expressive aphasia. Pt does follow commands and was able to answer simple yes or no questions. Pt was educated to foley removal in the morning and to the post foley protocol including bladder scans and In and Out urethral catheterization. Pt did ask for ice chips and was able to use a spoon to give herself ice chips. Pt took medications with applesauce per request without difficulty. Pt's heels were pink and boggy heel foam applied bilaterally and feet elevated on pillows. Pt given call light,3 SR up, and bed alarm armed.

## 2020-05-15 NOTE — Evaluation (Addendum)
Speech Language Pathology Assessment and Plan  Patient Details  Name: Sheila Ray MRN: [id number] Date of Birth: 25-May-1941  SLP Diagnosis: Aphasia;Cognitive Impairments;Speech and Language deficits  Rehab Potential: Good ELOS: 3-4 weeks    Today's Date: 05/16/2020 SLP Individual Time: 1100-1200 SLP Individual Time Calculation (min): 60 min   Hospital Problem: Principal Problem:   Left middle cerebral artery stroke [facility])  Past Medical History:  Past Medical History:  Diagnosis Date  . Arthritis   . Atrial fibrillation ([place])   . Complication of anesthesia   . Diabetes mellitus without complication ([place])   . Diverticulosis   . Fatigue   . Gastric polyp   . Goiter   . Hyperlipidemia   . Hypertension   . Hypothyroid    taken off of thyroid medication 2012014   . Obese   . Osteopenia   . PONV (postoperative nausea and vomiting)   . Postmenopausal   . Rosacea   . Stroke ([place]) 01/15/2013   left thalamic  stroke, small vessel  . Vitamin D deficiency    Past Surgical History:  Past Surgical History:  Procedure Laterality Date  . ABDOMINAL HYSTERECTOMY  1985  . FOOT SURGERY Right 08/2002  . IR CT HEAD LTD  05/03/2020  . IR PERCUTANEOUS ART THROMBECTOMY/INFUSION INTRACRANIAL INC DIAG ANGIO  05/03/2020  . RADIOLOGY WITH ANESTHESIA N/A 05/03/2020   Procedure: IR WITH ANESTHESIA;  Surgeon: Radiologist, Medication, MD;  Location: [place];  Service: Radiology;  Laterality: N/A;  . right knee arthroscopy   2013   . TONSILLECTOMY    . 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  Per chart review lives alone reportedly independent prior to admission and driving.  1 level home 3 steps to entry.  Presented 05/03/2020 with right side weakness facial droop expressive aphasia and altered mental status.  Her heart rate was noted to be in the 140s.  Cranial CT scan showed no acute intracranial hemorrhage no acute infarction.  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  No hemodynamically significant stenosis in the neck.  Patient underwent revascularization of occluded superior division M2 branch per interventional radiology.  MRI of the brain showed cortical restricted diffusion within the left temporal occipital region.  Scattered foci of restricted diffusion also seen in the left frontal parietal and insular regions of the left amygdala.  MRA with no proximal occlusion or stenosis.  EEG negative for seizure.  Echocardiogram with ejection fraction of 55 to 60% no wall motion abnormalities.  Neurology follow-up as well as cardiology services her Xarelto was changed to Eliquis.  She did remain on Cardizem as well as metoprolol for her atrial fibrillation.  Tolerating a regular diet.  Patient with noted leukocytosis 19,400-24,100 05/13/2020 as well as BUN 29 and creatinine 1.92.  Lactic acid within normal limits.  Renal ultrasound without hydronephrosis.  Chest x-ray showed persistent but improving CHF superimposed infection cannot be ruled out.  Urinalysis negative nitrite few bacteria.  Leukocytosis felt likely from underlying left lower extremity cellulitis and placed on Augmentin 05/15/2020 with WBC improving to 16,000.  Therapy evaluations completed and patient was admitted for a comprehensive rehab program 05/15/20. SLP evaluation was completed 05/16/20 with results as follows:  Pt presents with Moderate expressive/receptive aphasia, although expressive deficits > receptive. Her speech was moderately  disfluent when attempting to communicate basic wants, needs and ideas, frequently halting after 2-3 words. Mild phonemic paraphasias notes, and intermittent awareness of verbal errors. Mild-Mod perseverations on words also noted throughout session. She followed basic 1-step directions with ~85% accuracy, occasionally requiring visual cues, but unable to follow most basic 2-step directions without a model. During various structured naming tasks, most difficulty and cueing required for divergent naming. She was response to sentence completion and phonemic cues today. Basic yes/no questions were 80% accurate. She required Moderate repetition and word emphasis cues for comprehension of topic change and other directions throughout session. Recall of functional information was noted to be decreased with SLP (related to schedule and use of call bell for help vs TV function). Her basic problem solving and selective attention also noted to be reduced during functional tasks.  Bedside swallow evaluation administered in follow up to dysphagia therapy provided in acute care, per chart review. Pt demonstrate independent use of compensatory safe swallow strategies, no overt s/sx aspiration noted across regular texture solids or thin liquids. Mastication of regular texture snack was mildly prolonged, but functional and full oral clearance achieved. Recommend continue regular textures, thin liquids, meds whole with thins, set up and intermittent supervision during meals mostly due to cognition.  No follow up ST is indicated for dysphagia, however pt would greatly benefit from skilled ST to address aphasia and cognitive impairments prior to her discharge in order to maximize functional communication, safety and independence. ST still anticipates pt will require 24/7 supervision and hands on care for greatest safety, which could be a barrier to discharge, given that her family is only available intermittently.     Skilled  Therapeutic Interventions          Bedside swallow and cognitive-linguistic evaluations were administered and results were reviewed with pt (please see above for details regarding results).    SLP Assessment  Patient will need skilled Speech Lanaguage Pathology Services during CIR admission    Recommendations  SLP Diet Recommendations: Age appropriate regular solids;Thin Liquid Administration via: Cup;Straw Medication Administration: Whole meds with liquid Supervision: Patient able to self feed;Intermittent supervision to cue for compensatory strategies Compensations: Minimize environmental distractions;Slow rate;Small sips/bites Postural Changes and/or Swallow Maneuvers: Seated upright 90 degrees;Upright 30-60 min after meal Oral Care Recommendations: Oral care BID Patient destination: Home Follow up Recommendations: 24 hour supervision/assistance;Home Health SLP Equipment Recommended: None recommended by SLP    SLP Frequency 3 to 5 out of 7 days   SLP Duration  SLP Intensity  SLP Treatment/Interventions 3-4 weeks  Minumum of 1-2 x/day, 30 to 90 minutes  Cognitive remediation/compensation;Cueing hierarchy;Speech/Language facilitation;Functional tasks;Patient/family education;Therapeutic Activities;Internal/external aids    Pain Pain Assessment Pain Scale: 0-10 Pain Score: 0-No pain  Prior Functioning Type of Home: House  Lives With: Alone Available Help at Discharge: Family;Available PRN/intermittently  SLP Evaluation Cognition Overall Cognitive Status: Impaired/Different from baseline Arousal/Alertness: Awake/alert Orientation Level: Oriented to situation;Other (comment);Oriented to person;Oriented to place (oriented to season with multiple choice cues) Attention: Selective Focused Attention: Impaired Focused Attention Impairment: Verbal basic;Verbal complex Sustained Attention: Impaired Sustained Attention Impairment: Verbal basic;Verbal complex Selective Attention:  Impaired Selective Attention Impairment: Verbal basic;Functional basic Memory: Impaired Memory Impairment: Decreased short term memory Decreased Short Term Memory: Functional basic Awareness: Impaired Awareness  Impairment: Emergent impairment Problem Solving: Impaired Problem Solving Impairment: Verbal basic;Functional basic Executive Function: Sequencing;Organizing;Initiating;Self Correcting Sequencing: Impaired Sequencing Impairment: Functional basic Organizing: Impaired Organizing Impairment: Functional basic;Verbal basic Initiating: Impaired Initiating Impairment: Functional basic Self Correcting: Impaired Self Correcting Impairment: Verbal basic;Functional basic Behaviors: Perseveration Safety/Judgment: Impaired  Comprehension Auditory Comprehension Overall Auditory Comprehension: Impaired Yes/No Questions: Impaired Basic Immediate Environment Questions: 75-100% accurate Complex Questions: 25-49% accurate Commands: Impaired One Step Basic Commands: 75-100% accurate Two Step Basic Commands: 0-24% accurate Multistep Basic Commands: 0-24% accurate Conversation: Simple Interfering Components: Attention;Working memory;Processing speed;Motor planning Visual Recognition/Discrimination Discrimination: Not tested Reading Comprehension Reading Status: Not tested Expression Expression Primary Mode of Expression: Verbal Verbal Expression Overall Verbal Expression: Impaired Initiation: Impaired Automatic Speech: Name;Social Response Level of Generative/Spontaneous Verbalization: Phrase Repetition: Impaired Level of Impairment: Phrase level Naming: Impairment Responsive: 51-75% accurate Confrontation: Impaired Convergent: 75-100% accurate Divergent: 50-74% accurate Verbal Errors: Perseveration;Phonemic paraphasias Pragmatics: No impairment Effective Techniques: Phonemic cues;Sentence completion Non-Verbal Means of Communication: Not applicable Written Expression Written  Expression: Not tested Oral Motor Oral Motor/Sensory Function Overall Oral Motor/Sensory Function: Mild impairment (unable to follow all commands but noted right sided weakness) Mandible: Within Functional Limits Motor Speech Overall Motor Speech: Appears within functional limits for tasks assessed Respiration: Within functional limits Phonation: Normal Intelligibility: Intelligible Motor Planning: Witnin functional limits Motor Speech Errors: Not applicable  Care Tool Care Tool Cognition Expression of Ideas and Wants Expression of Ideas and Wants: Frequent difficulty - frequently exhibits difficulty with expressing needs and ideas   Understanding Verbal and Non-Verbal Content Understanding Verbal and Non-Verbal Content: Usually understands - understands most conversations, but misses some part/intent of message. Requires cues at times to understand   Memory/Recall Ability *first 3 days only Memory/Recall Ability *first 3 days only: That he or she is in a hospital/hospital unit;Current season (current season required multiple choice cues)     Intelligibility: Intelligible  Bedside Swallowing Assessment General Date of Onset: 05/03/20 Previous Swallow Assessment: BSE 11/4 Diet Prior to this Study: Thin liquids;Regular Temperature Spikes Noted: No Respiratory Status: Room air History of Recent Intubation: Yes Length of Intubations (days):  (procedure only) Date extubated: 05/03/20 Behavior/Cognition: Alert;Cooperative Oral Cavity - Dentition: Adequate natural dentition Self-Feeding Abilities: Able to feed self Vision: Functional for self-feeding Patient Positioning: Upright in chair/Tumbleform Baseline Vocal Quality: Normal Volitional Cough: Weak Volitional Swallow: Able to elicit  Oral Care Assessment Does patient have any of the following "high(er) risk" factors?: None of the above Does patient have any of the following "at risk" factors?: Other - dysphagia;None of the  above Patient is LOW RISK: Follow universal precautions (see row information) Ice Chips Ice chips: Not tested Thin Liquid Thin Liquid: Within functional limits Presentation: Self Fed;Straw Nectar Thick Nectar Thick Liquid: Not tested Honey Thick Honey Thick Liquid: Not tested Puree Puree: Within functional limits Presentation: Spoon Solid Solid: Impaired Oral Phase Functional Implications: Impaired mastication BSE Assessment Risk for Aspiration Impact on safety and function: Mild aspiration risk Other Related Risk Factors: Previous CVA;Cognitive impairment  Short Term Goals: Week 1: SLP Short Term Goal 1 (Week 1): Pt will follow 2-step commands with 50% accuracy provided Max A multimodal cues. SLP Short Term Goal 2 (Week 1): Pt will demonstrate ability to problem solve functional situations with Mod A verbal/visual cues. SLP Short Term Goal 3 (Week 1): Pt will demonstrate recall of functional daily information and/or safety precautions with Mod A verbal/visual cues. SLP Short Term Goal 4 (Week 1): Pt will communicate at the phrase level to  express basic wants and need with Mod A multimodal cues. SLP Short Term Goal 5 (Week 1): Pt will name produce phrase level utterances with Mod A multimodal cues for word finding strategies. SLP Short Term Goal 6 (Week 1): Pt will detect verbal and/or functional errors with Mod A multimodal cues.  Refer to Care Plan for Long Term Goals  Recommendations for other services: None   Discharge Criteria: Patient will be discharged from SLP if patient refuses treatment 3 consecutive times without medical reason, if treatment goals not met, if there is a change in medical status, if patient makes no progress towards goals or if patient is discharged from hospital.  The above assessment, treatment plan, treatment alternatives and goals were discussed and mutually agreed upon: by patient  Arbutus Leas 05/16/2020, 12:34 PM

## 2020-05-15 NOTE — Progress Notes (Signed)
Inpatient Rehabilitation Medication Review by a Pharmacist  A complete drug regimen review was completed for this patient to identify any potential clinically significant medication issues.  Clinically significant medication issues were identified:  Yes  Type of Medication Issue Identified Description of Issue Urgent (address now) Non-Urgent (address on AM team rounds) Plan Plan Accepted by Provider? (Yes / No / Pending AM Rounds)  Drug Interaction(s) (clinically significant)       Duplicate Therapy       Allergy       No Medication Administration End Date       Incorrect Dose       Additional Drug Therapy Needed       Other  The following medications were listed on pt's med list PTA to [facility], but were not ordered at [facility] or on admission to CIR: Alendronate Cyanocobalamin  Magnesium oxide Metformin Non urgent Send secure chat to CIR provider Pending AM rounds    Name of provider notified for urgent issues identified:  N/A  For non-urgent medication issues to be resolved on team rounds tomorrow morning a [facility] Secure Chat Handoff was sent to:  Reesa Chew, PA; Marlowe Shores, PA  Time spent performing this drug regimen review (minutes):  [place], PharmD, BCPS, [facility] Clinical Pharmacist 05/15/2020 2:58 PM

## 2020-05-15 NOTE — H&P (Signed)
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  Per chart review lives alone reportedly independent prior to admission and driving.  1 level home 3 steps to entry.  Presented 05/03/2020 with right side weakness facial droop expressive aphasia and altered mental status.  Her heart rate was noted to be in the 140s.  Cranial CT scan showed no acute intracranial hemorrhage no acute infarction.  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  No hemodynamically significant stenosis in the neck.  Patient underwent revascularization of occluded superior division M2 branch per interventional radiology.  MRI of the brain showed cortical restricted diffusion within the left temporal occipital region.  Scattered foci of restricted diffusion also seen in the left frontal parietal and insular regions of the left amygdala.  MRA with no proximal occlusion or stenosis.  EEG negative for seizure.  Echocardiogram with ejection fraction of 55 to 60% no wall motion abnormalities.  Neurology follow-up as well as cardiology services her Xarelto was changed to Eliquis.  She did remain on Cardizem as well as metoprolol for her atrial fibrillation.  Tolerating a regular diet.  Patient with noted leukocytosis 19,400-24,100 05/13/2020 as well as BUN 29 and creatinine 1.92.  Lactic acid within normal limits.  Renal ultrasound without hydronephrosis.  Chest x-ray showed persistent but improving CHF superimposed infection cannot be ruled out.  Urinalysis negative nitrite few bacteria.  Leukocytosis felt likely from underlying left lower extremity cellulitis and placed on Augmentin 05/15/2020 with WBC improving to 16,000.   Therapy evaluations completed and patient was admitted for a comprehensive rehab program.  Review of Systems  Constitutional: Positive for malaise/fatigue. Negative for chills and fever.  HENT: Negative for hearing loss.   Eyes: Negative for blurred vision and double vision.  Respiratory: Negative for cough and shortness of breath.   Cardiovascular: Positive for palpitations and leg swelling.  Gastrointestinal: Positive for constipation. Negative for heartburn, nausea and vomiting.  Genitourinary: Negative for dysuria, flank pain and hematuria.  Musculoskeletal: Positive for myalgias.  Skin: Negative for rash.  Neurological: Positive for speech change and weakness.  All other systems reviewed and are negative.  Past Medical History:  Diagnosis Date  . Arthritis   . Atrial fibrillation ([place])   . Complication of anesthesia   . Diabetes mellitus without complication ([place])   . Diverticulosis   . Fatigue   . Gastric polyp   . Goiter   . Hyperlipidemia   . Hypertension   . Hypothyroid    taken off of thyroid medication 2012014   . Obese   . Osteopenia   . PONV (postoperative nausea and vomiting)   . Postmenopausal   . Rosacea   . Stroke ([place]) 01/15/2013   left thalamic  stroke, small vessel  . Vitamin D deficiency    Past Surgical History:  Procedure Laterality Date  . ABDOMINAL HYSTERECTOMY  1985  . FOOT SURGERY Right 08/2002  . IR CT HEAD LTD  05/03/2020  . IR PERCUTANEOUS ART THROMBECTOMY/INFUSION INTRACRANIAL INC DIAG ANGIO  05/03/2020  . RADIOLOGY WITH ANESTHESIA N/A 05/03/2020   Procedure: IR WITH ANESTHESIA;  Surgeon: Radiologist, Medication, MD;  Location: [facility];  Service: Radiology;  Laterality: N/A;  . right knee arthroscopy   2013   .  TONSILLECTOMY    . 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   Procedure: RIGHT TOTAL KNEE ARTHROPLASTY;  Surgeon: Gearlean Alf, MD;  Location: [facility];  Service: Orthopedics;  Laterality: Right;   Family History  Problem Relation Age  of Onset  . Osteoporosis Mother   . Alzheimer's disease Mother 50  . Arthritis Mother   . Cancer Father        Lung  . Arthritis Sister   . Obesity Sister   . Heart attack Brother 58  . Heart disease Son   . Arthritis Brother   . Arthritis Sister   . Cancer Sister        breast cancer   Social History:  reports that she has never smoked. She has never used smokeless tobacco. She reports that she does not drink alcohol and does not use drugs. Allergies:  Allergies  Allergen Reactions  . Crestor [Rosuvastatin] Other (See Comments)    Cramps  . Lipitor [Atorvastatin] Other (See Comments)    Cramps  . Pravastatin Other (See Comments)    Cramps   . Keflex [Cephalexin] Hives   Medications Prior to Admission  Medication Sig Dispense Refill  . alendronate (FOSAMAX) 70 MG tablet TAKE 1 TABLET BY MOUTH  WEEKLY AS DIRECTED (Patient taking differently: Take 70 mg by mouth once a week. ) 12 tablet 3  . Cyanocobalamin (VITAMIN B12) 1000 MCG TBCR Take 1,000 mcg by mouth daily.     Marland Kitchen diltiazem (CARDIZEM CD) 240 MG 24 hr capsule TAKE 1 CAPSULE BY MOUTH  DAILY (Patient taking differently: Take 240 mg by mouth daily. ) 90 capsule 3  . glucose blood (ONE TOUCH ULTRA TEST) test strip USE TO CHECK BLOOD GLUCOSE  ONCE A DAY AS INSTRUCTED 100 each 6  . lisinopril (ZESTRIL) 40 MG tablet TAKE 1 TABLET BY MOUTH  DAILY (Patient taking differently: Take 40 mg by mouth daily. ) 90 tablet 3  . Magnesium Oxide (MAG-OX 400 PO) Take 400 mg by mouth daily.    . metFORMIN (GLUCOPHAGE) 1000 MG tablet Take 0.5 tablets (500 mg total) by mouth daily with breakfast. 90 tablet 0  . metoprolol succinate (TOPROL-XL) 50 MG 24 hr tablet TAKE 1 TABLET BY MOUTH  DAILY WITH OR IMMEDIATELY  FOLLOWING A MEAL (Patient taking differently: Take 37.5 mg by mouth in the morning and at bedtime. ) 90 tablet 0  . ONETOUCH DELICA LANCETS 56O MISC 1 each by Does not apply route daily. Use to check BG daily 100 each 2  . XARELTO 20 MG TABS  tablet TAKE 1 TABLET BY MOUTH  DAILY (Patient taking differently: Take 20 mg by mouth daily with supper. ) 90 tablet 0    Drug Regimen Review Drug regimen was reviewed and remains appropriate with no significant issues identified  Home: Home Living Family/patient expects to be discharged to:: Private residence Living Arrangements: Alone Available Help at Discharge: Family, Available PRN/intermittently Type of Home: House Home Access: Stairs to enter Technical brewer of Steps: 3 Entrance Stairs-Rails: Right, Left Home Layout: One level Bathroom Shower/Tub: Multimedia programmer: Standard Bathroom Accessibility: Yes Home Equipment: None Additional Comments: per chart review  Lives With: Alone   Functional History: Prior Function Level of Independence: Independent Comments: drove, I in ADL's and iADL's  Functional Status:  Mobility: Bed Mobility Overal bed mobility: Needs Assistance Bed Mobility: Supine to Sit Rolling: Mod assist Supine to sit: Mod assist, +2 for physical assistance, HOB elevated Sit to supine: Total assist, +2  for physical assistance Sit to sidelying: Total assist, +2 for physical assistance, +2 for safety/equipment General bed mobility comments: pt with use of rail, PT/OT initiation of roll to allow pt to reach railing with LUE. PT assists some with rotation of hips with pad Transfers Overall transfer level: Needs assistance Equipment used: 2 person hand held assist Transfers: Sit to/from Stand, Stand Pivot Transfers Sit to Stand: Mod assist, +2 physical assistance Stand pivot transfers: Mod assist, +2 physical assistance General transfer comment: pt completes 3 sit to stands, 2 with 2 person hand hold and modA x2, one with PT hand hold and R knee block. PT requires initiation of turning for SPT as well as assistance to mobilize RLE during transfer Ambulation/Gait General Gait Details: unable  ADL: ADL Overall ADL's : Needs  assistance/impaired Eating/Feeding: Moderate assistance, Sitting Eating/Feeding Details (indicate cue type and reason): pt attending to the L side of the tray. pt more fixated on liquids. pt drinking from the milk and juice at the same time with straws Grooming: Wash/dry face, Bed level, Moderate assistance Grooming Details (indicate cue type and reason): pt initiates and sustains task to bil side of  face Upper Body Bathing: Maximal assistance, Sitting Lower Body Bathing: Total assistance, +2 for physical assistance, +2 for safety/equipment, Sit to/from stand Upper Body Dressing : Total assistance, Sitting Lower Body Dressing: Total assistance Toilet Transfer: +2 for physical assistance, Moderate assistance Toilet Transfer Details (indicate cue type and reason): simulated EOB to chair -- incontinence of bladder Functional mobility during ADLs: +2 for physical assistance, Total assistance, +2 for safety/equipment General ADL Comments: pt limited by impaired cognition, anxiety, weakness, impaired commuincation   Cognition: Cognition Overall Cognitive Status: Impaired/Different from baseline Orientation Level: Oriented to place, Disoriented to place, Disoriented to time, Disoriented to situation Cognition Arousal/Alertness: Awake/alert Behavior During Therapy: Flat affect Overall Cognitive Status: Impaired/Different from baseline Area of Impairment: Attention, Memory, Following commands, Safety/judgement, Awareness, Problem solving Current Attention Level: Sustained Memory: Decreased recall of precautions, Decreased short-term memory Following Commands: Follows one step commands with increased time (with left side, less consistent with R side) Safety/Judgement: Decreased awareness of safety, Decreased awareness of deficits Awareness: Intellectual Problem Solving: Slow processing, Requires verbal cues, Requires tactile cues, Difficulty sequencing, Decreased initiation General Comments: pt  answers yes to most questions initially but when given the command to say "no" pt was able to do so. pt able to verbalize flowers are from daughter in law. pt able to express trouble speaking. pt able to follow 1 step commands. pt attempting to self feed with L UE Difficult to assess due to: Impaired communication  Physical Exam: Blood pressure 122/60, pulse 94, temperature 97.6 F (36.4 C), resp. rate 20, height 5\' 4"  (1.626 m), SpO2 100 %. Physical Exam General: Pleasantly confused, No apparent distress HEENT: Head is normocephalic, atraumatic, PERRLA, EOMI, sclera anicteric, oral mucosa pink and moist, dentition intact, ext ear canals clear,  Neck: Supple without JVD or lymphadenopathy Heart: Irregularly irregular. No murmurs rubs or gallops Chest: CTA bilaterally without wheezes, rales, or rhonchi; no distress Abdomen: Soft, non-tender, non-distended, bowel sounds positive. Extremities: No clubbing, cyanosis, or edema. Pulses are 2+ Skin: Clean and intact without signs of breakdown Neuro: Patient is alert.  Exhibits a mild left gaze preference. Difficulty following commands but generally LUE 5/5, RUE: 3/5, LLE 3/5, not moving RLE. Patient is aphasic.  She can speak at a simple phrase level.  Psych: Pt's affect is confused. Pt is cooperative  Results for orders placed  or performed during the hospital encounter of 05/03/20 (from the past 48 hour(s))  Glucose, capillary     Status: Abnormal   Collection Time: 05/13/20  4:41 PM  Result Value Ref Range   Glucose-Capillary 106 (H) 70 - 99 mg/dL    Comment: Glucose reference range applies only to samples taken after fasting for at least 8 hours.   Comment 1 Notify RN    Comment 2 Document in Chart   Glucose, capillary     Status: Abnormal   Collection Time: 05/13/20  8:56 PM  Result Value Ref Range   Glucose-Capillary 130 (H) 70 - 99 mg/dL    Comment: Glucose reference range applies only to samples taken after fasting for at least 8  hours.  Glucose, capillary     Status: Abnormal   Collection Time: 05/13/20 11:42 PM  Result Value Ref Range   Glucose-Capillary 132 (H) 70 - 99 mg/dL    Comment: Glucose reference range applies only to samples taken after fasting for at least 8 hours.   Comment 1 Notify RN    Comment 2 Document in Chart   CBC     Status: Abnormal   Collection Time: 05/14/20  1:11 AM  Result Value Ref Range   WBC 24.1 (H) 4.0 - 10.5 K/uL   RBC 4.49 3.87 - 5.11 MIL/uL   Hemoglobin 10.5 (L) 12.0 - 15.0 g/dL   HCT 34.4 (L) 36 - 46 %   MCV 76.6 (L) 80.0 - 100.0 fL   MCH 23.4 (L) 26.0 - 34.0 pg   MCHC 30.5 30.0 - 36.0 g/dL   RDW 16.8 (H) 11.5 - 15.5 %   Platelets 448 (H) 150 - 400 K/uL   nRBC 0.0 0.0 - 0.2 %    Comment: Performed at [facility], [place] [address] [place], [place] [address]  Basic metabolic panel     Status: Abnormal   Collection Time: 05/14/20  1:11 AM  Result Value Ref Range   Sodium 129 (L) 135 - 145 mmol/L   Potassium 4.1 3.5 - 5.1 mmol/L   Chloride 100 98 - 111 mmol/L   CO2 18 (L) 22 - 32 mmol/L   Glucose, Bld 131 (H) 70 - 99 mg/dL    Comment: Glucose reference range applies only to samples taken after fasting for at least 8 hours.   BUN 32 (H) 8 - 23 mg/dL   Creatinine, Ser 2.07 (H) 0.44 - 1.00 mg/dL   Calcium 8.3 (L) 8.9 - 10.3 mg/dL   GFR, Estimated 24 (L) >60 mL/min    Comment: (NOTE) Calculated using the CKD-EPI Creatinine Equation (2021)    Anion gap 11 5 - 15    Comment: Performed at [facility] [address], [place], [place] [address]  Magnesium     Status: None   Collection Time: 05/14/20  1:11 AM  Result Value Ref Range   Magnesium 1.8 1.7 - 2.4 mg/dL    Comment: Performed at [facility] [address], [place], [place] [address]  Glucose, capillary     Status: Abnormal   Collection Time: 05/14/20  6:44 AM  Result Value Ref Range   Glucose-Capillary 106 (H) 70 - 99 mg/dL    Comment: Glucose reference range applies only to samples taken  after fasting for at least 8 hours.   Comment 1 Notify RN    Comment 2 Document in Chart   Glucose, capillary     Status: Abnormal  Collection Time: 05/14/20 12:39 PM  Result Value Ref Range   Glucose-Capillary 166 (H) 70 - 99 mg/dL    Comment: Glucose reference range applies only to samples taken after fasting for at least 8 hours.  Glucose, capillary     Status: Abnormal   Collection Time: 05/14/20  3:47 PM  Result Value Ref Range   Glucose-Capillary 104 (H) 70 - 99 mg/dL    Comment: Glucose reference range applies only to samples taken after fasting for at least 8 hours.  Glucose, capillary     Status: Abnormal   Collection Time: 05/14/20  9:19 PM  Result Value Ref Range   Glucose-Capillary 101 (H) 70 - 99 mg/dL    Comment: Glucose reference range applies only to samples taken after fasting for at least 8 hours.   Comment 1 Notify RN    Comment 2 Document in Chart   Basic metabolic panel     Status: Abnormal   Collection Time: 05/15/20  4:06 AM  Result Value Ref Range   Sodium 136 135 - 145 mmol/L   Potassium 3.5 3.5 - 5.1 mmol/L   Chloride 110 98 - 111 mmol/L   CO2 20 (L) 22 - 32 mmol/L   Glucose, Bld 101 (H) 70 - 99 mg/dL    Comment: Glucose reference range applies only to samples taken after fasting for at least 8 hours.   BUN 19 8 - 23 mg/dL   Creatinine, Ser 1.14 (H) 0.44 - 1.00 mg/dL   Calcium 8.0 (L) 8.9 - 10.3 mg/dL   GFR, Estimated 49 (L) >60 mL/min    Comment: (NOTE) Calculated using the CKD-EPI Creatinine Equation (2021)    Anion gap 6 5 - 15    Comment: Performed at [facility] [address] [place], [place] [address]  CBC with Differential/Platelet     Status: Abnormal   Collection Time: 05/15/20  4:06 AM  Result Value Ref Range   WBC 16.0 (H) 4.0 - 10.5 K/uL   RBC 3.77 (L) 3.87 - 5.11 MIL/uL   Hemoglobin 8.9 (L) 12.0 - 15.0 g/dL    Comment: Reticulocyte Hemoglobin testing may be clinically indicated, consider ordering this additional test  [id number]    HCT 28.7 (L) 36 - 46 %   MCV 76.1 (L) 80.0 - 100.0 fL   MCH 23.6 (L) 26.0 - 34.0 pg   MCHC 31.0 30.0 - 36.0 g/dL   RDW 16.8 (H) 11.5 - 15.5 %   Platelets 341 150 - 400 K/uL   nRBC 0.0 0.0 - 0.2 %   Neutrophils Relative % 79 %   Neutro Abs 12.5 (H) 1.7 - 7.7 K/uL   Lymphocytes Relative 7 %   Lymphs Abs 1.1 0.7 - 4.0 K/uL   Monocytes Relative 12 %   Monocytes Absolute 1.9 (H) 0.1 - 1.0 K/uL   Eosinophils Relative 1 %   Eosinophils Absolute 0.2 0.0 - 0.5 K/uL   Basophils Relative 0 %   Basophils Absolute 0.0 0.0 - 0.1 K/uL   Immature Granulocytes 1 %   Abs Immature Granulocytes 0.12 (H) 0.00 - 0.07 K/uL    Comment: Performed at [facility], [address], [place], [place] [address]  Glucose, capillary     Status: None   Collection Time: 05/15/20  6:23 AM  Result Value Ref Range   Glucose-Capillary 93 70 - 99 mg/dL    Comment: Glucose reference range applies only to samples taken after fasting for at least 8  hours.   Comment 1 Notify RN    Comment 2 Document in Chart   Glucose, capillary     Status: None   Collection Time: 05/15/20 12:37 PM  Result Value Ref Range   Glucose-Capillary 98 70 - 99 mg/dL    Comment: Glucose reference range applies only to samples taken after fasting for at least 8 hours.   US RENAL  Result Date: 05/14/2020 CLINICAL DATA:  Acute renal insufficiency EXAM: RENAL / URINARY TRACT ULTRASOUND COMPLETE COMPARISON:  None. FINDINGS: Right Kidney: Renal measurements: 10.3 by 6.3 x 4.7 cm = volume: 157.6 mL. Echogenicity within normal limits. No mass or hydronephrosis visualized. Mild prominence of the right renal pelvis is nonspecific. Left Kidney: Renal measurements: 10.2 x 6.1 by 4.9 cm = volume: 161.3 mL. Echogenicity within normal limits. No mass or hydronephrosis visualized. Bladder: Decompressed and not evaluated. Other: None. IMPRESSION: 1. Mild distension of the right renal pelvis without frank hydronephrosis. Otherwise unremarkable  exam. Electronically Signed   By: Randa Ngo M.D.   On: 05/14/2020 15:47   VAS [place] LOWER EXTREMITY VENOUS (DVT)  Result Date: 05/14/2020  Lower Venous DVT Study Indications: Swelling.  Risk Factors: None identified. Limitations: Body habitus, poor ultrasound/tissue interface and patient positioning, patient immobility. Comparison Study: No prior studies. Performing Technologist: Oliver Hum RVT  Examination Guidelines: A complete evaluation includes B-mode imaging, spectral Doppler, color Doppler, and power Doppler as needed of all accessible portions of each vessel. Bilateral testing is considered an integral part of a complete examination. Limited examinations for reoccurring indications may be performed as noted. The reflux portion of the exam is performed with the patient in reverse Trendelenburg.  +---------+---------------+---------+-----------+----------+--------------+ RIGHT    CompressibilityPhasicitySpontaneityPropertiesThrombus Aging +---------+---------------+---------+-----------+----------+--------------+ CFV      Full           Yes      Yes                                 +---------+---------------+---------+-----------+----------+--------------+ SFJ      Full                                                        +---------+---------------+---------+-----------+----------+--------------+ FV Prox  Full                                                        +---------+---------------+---------+-----------+----------+--------------+ FV Mid   Full                                                        +---------+---------------+---------+-----------+----------+--------------+ FV DistalFull                                                        +---------+---------------+---------+-----------+----------+--------------+ PFV  Full                                                         +---------+---------------+---------+-----------+----------+--------------+ POP      Full           Yes      Yes                                 +---------+---------------+---------+-----------+----------+--------------+ PTV      Full                                                        +---------+---------------+---------+-----------+----------+--------------+ PERO     Full                                                        +---------+---------------+---------+-----------+----------+--------------+   +---------+---------------+---------+-----------+----------+-------------------+ LEFT     CompressibilityPhasicitySpontaneityPropertiesThrombus Aging      +---------+---------------+---------+-----------+----------+-------------------+ CFV      Full           Yes      Yes                                      +---------+---------------+---------+-----------+----------+-------------------+ SFJ      Full                                                             +---------+---------------+---------+-----------+----------+-------------------+ FV Prox  Full                                                             +---------+---------------+---------+-----------+----------+-------------------+ FV Mid   Full                                                             +---------+---------------+---------+-----------+----------+-------------------+ FV Distal                                             Not well visualized +---------+---------------+---------+-----------+----------+-------------------+ PFV      Full                                                             +---------+---------------+---------+-----------+----------+-------------------+  POP      Full           Yes      Yes                                      +---------+---------------+---------+-----------+----------+-------------------+ PTV      Full                                                              +---------+---------------+---------+-----------+----------+-------------------+ PERO                                                  Not well visualized +---------+---------------+---------+-----------+----------+-------------------+     Summary: RIGHT: - No evidence of common femoral vein obstruction.  LEFT: - There is no evidence of deep vein thrombosis in the lower extremity. However, portions of this examination were limited- see technologist comments above.  - No cystic structure found in the popliteal fossa.  *See table(s) above for measurements and observations.    Preliminary        Medical Problem List and Plan: 1.  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  Antithrombotics: -DVT/anticoagulation: Bilateral lower extremity Dopplers negative for DVT.  Eliquis  -antiplatelet therapy: N/A 3. Pain Management: Tylenol as needed. Complains of neck discomfort: will order kpad 4. Mood: Provide emotional support  -antipsychotic agents: N/A 5. Neuropsych: This patient is capable of making decisions on her own behalf. 6. Skin/Wound Care: Routine skin checks 7. Fluids/Electrolytes/Nutrition: Routine in and outs with follow-up chemistries 8.  Atrial fibrillation.  Cardizem 60 mg every 8 hours, Toprol-XL 100 mg daily.  Cardiac rate controlled to 80-90 on 11/15 9.  Diabetes mellitus.  Hemoglobin A1c 6.4.  SSI.  Patient on Glucophage 500 mg daily prior to admission.  Resume as needed 10.Hypertension.  Monitor with increased mobility.  Patient on lisinopril 40 mg daily prior to admission.  Resume as needed. Currently BP is soft.  11.  Hyperlipidemia.  Zetia 12.  Left lower extremity cellulitis.  Venous Doppler studies  negative.  Complete course of Augmentin initiated 05/15/2020.  Lavon Paganini Angiulli, PA-C  I have personally performed a face to face diagnostic evaluation, including, but not limited to relevant history and physical exam findings, of this patient and developed relevant assessment and plan.  Additionally, I have reviewed and concur with the physician assistant's documentation above.  Leeroy Cha, MD

## 2020-05-15 NOTE — Progress Notes (Signed)
Cristina Gong, RN  Rehab Admission Coordinator  Physical Medicine and Rehabilitation  PMR Pre-admission     Addendum  Date of Service:  05/10/2020 12:14 PM      Related encounter: ED to Hosp-Admission (Current) from 05/03/2020 in [facility]       Show:Clear all [x] Manual[x] Template[x] Copied  Added by: [x] Cristina Gong, RN  [] Hover for details PMR Admission Coordinator Pre-Admission Assessment   Patient: Sheila Ray is an 79 y.o., female MRN: [id number] DOB: 05-Dec-1940 Height: 5' 4.5" (163.8 cm) Weight: 79.4 kg                                                                                                                                                  Insurance Information HMO: yes PPO:      PCP:      IPA:      80/20:      OTHER:  PRIMARY: [place] [employer]      Policy#: [id number]      Subscriber: pt CM Name: Charlena Cross     Phone#: [phone] ext [phone]     Fax#: [phone] Pre-Cert#: [id number] approved for 7 days     Employer:  Benefits:  Phone #: [phone]     Name: 11/10 Eff. Date: 03/31/2020     Deduct: none      Out of Pocket Max: $4500 CIR: $325 co pay per day days 1 until 5      SNF: no copay days 1 until 20; $184 co pay per day days 21 until 45; no copay days 46 until 100 Outpatient: $35 per visit     Co-Pay: visits per medical neccesity Home Health: 100%      Co-Pay: visits per medical neccesity DME: 80%     Co-Pay: 20% Providers: in network  SECONDARY: none      Policy#:       Phone#:    Development worker, community:       Phone#:    The Engineer, petroleum" for patients in Inpatient Rehabilitation Facilities with attached "Privacy Act [place] Records" was provided and verbally reviewed with: Family   Emergency Contact Information         Contact Information     Name Relation Home Work Mobile    [place] Son Purcell, [place] Other     [phone]       Current Medical  History  Patient Admitting Diagnosis: CVA   History of Present Illness:  79 year old right-handed female with 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    Presented 05/03/2020 with right side weakness facial droop expressive aphasia and altered mental status.  Her heart rate was noted to be in the 140s.  Cranial  CT scan showed no acute intracranial hemorrhage no acute infarction.  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  No hemodynamically significant stenosis in the neck.  Patient underwent revascularization of occluded superior division M2 branch per interventional radiology.  MRI of the brain showed cortical restricted diffusion within the left temporal occipital region.  Scattered foci of restricted diffusion also seen in the left frontal parietal and insular regions of the left amygdala.  MRA with no proximal occlusion or stenosis.  EEG negative for seizure.  Echocardiogram with ejection fraction of 55 to 60% no wall motion abnormalities.  Neurology follow-up as well as cardiology services her Xarelto was changed to Eliquis.  She did remain on Cardizem as well as metoprolol for her atrial fibrillation.  Tolerating a regular diet.  Patient with noted leukocytosis 19,400-24,100 05/13/2020 as well as BUN 29 and creatinine 1.92.  Lactic acid within normal limits.  Renal ultrasound without hydronephrosis.  Chest x-ray showed persistent but improving CHF superimposed infection cannot be ruled out.  Urinalysis negative nitrite few bacteria.  Leukocytosis felt likely from underlying left lower extremity cellulitis and placed on Augmentin 05/15/2020 with WBC improving to 16,000.     Complete NIHSS TOTAL: 10 Glasgow Coma Scale Score: 14   Past Medical History      Past Medical History:  Diagnosis Date  . Arthritis      . Atrial fibrillation ([place])    . Complication of anesthesia    . Diabetes mellitus without complication ([place])    . Diverticulosis    . Fatigue    . Gastric polyp    . Goiter    . Hyperlipidemia    . Hypertension    . Hypothyroid      taken off of thyroid medication 2012014   . Obese    . Osteopenia    . PONV (postoperative nausea and vomiting)    . Postmenopausal    . Rosacea    . Stroke ([place]) 01/15/2013    left thalamic  stroke, small vessel  . Vitamin D deficiency        Family History  family history includes Alzheimer's disease (age of onset: 8) in her mother; Arthritis in her brother, mother, sister, and sister; Cancer in her father and sister; Heart attack (age of onset: 81) in her brother; Heart disease in her son; Obesity in her sister; Osteoporosis in her mother.   Prior Rehab/Hospitalizations:  Has the patient had prior rehab or hospitalizations prior to admission? Yes   Has the patient had major surgery during 100 days prior to admission? Yes   Current Medications    Current Facility-Administered Medications:  .   stroke: mapping our early stages of recovery book, , Does not apply, Once, Bhagat, Srishti L, MD .  0.9 %  sodium chloride infusion, 250 mL, Intravenous, Continuous, Oletta Darter Virgina Evener, MD, Held at 05/03/20 2315 .  0.9 %  sodium chloride infusion, , Intravenous, Continuous, Darrick Meigs, Marge Duncans, MD, Last Rate: 100 mL/hr at 05/15/20 0222, New Bag at 05/15/20 0222 .  acetaminophen (TYLENOL) tablet 650 mg, 650 mg, Oral, Q4H PRN, 650 mg at 05/09/20 0828 **OR** acetaminophen (TYLENOL) 160 MG/5ML solution 650 mg, 650 mg, Per Tube, Q4H PRN **OR** acetaminophen (TYLENOL) suppository 650 mg, 650 mg, Rectal, Q4H PRN, Bhagat, Srishti L, MD .  amoxicillin-clavulanate (AUGMENTIN) 875-125 MG per tablet 1 tablet, 1 tablet, Oral, Q12H, Rolla Flatten, [facility], 1 tablet at 05/15/20 0912 .  apixaban (ELIQUIS) tablet 5 mg, 5 mg, Oral, BID, Garvin Fila, MD, 5 mg at 05/15/20  0912 .  Chlorhexidine Gluconate Cloth 2 % PADS 6 each, 6 each, Topical, Daily, Rosalin Hawking, MD, 6 each at 05/15/20 0913 .  diltiazem (CARDIZEM) tablet 60 mg, 60 mg, Oral, Q8H, Minus Breeding, MD, 60 mg at 05/15/20 0542 .  ezetimibe (ZETIA) tablet 10 mg, 10 mg, Oral, Daily, Rosalin Hawking, MD, 10 mg at 05/15/20 0912 .  insulin aspart (novoLOG) injection 0-6 Units, 0-6 Units, Subcutaneous, TID AC & HS, Rinehuls, Early Chars, PA-C, 1 Units at 05/10/20 1639 .  ipratropium (ATROVENT) 0.06 % nasal spray 1 spray, 1 spray, Each Nare, BID, Rosalin Hawking, MD, 1 spray at 05/15/20 0914 .  MEDLINE mouth rinse, 15 mL, Mouth Rinse, BID, Rosalin Hawking, MD, 15 mL at 05/15/20 0914 .  metoprolol succinate (TOPROL-XL) 24 hr tablet 100 mg, 100 mg, Oral, Daily, Biby, Sharon L, NP, 100 mg at 05/15/20 0912 .  metoprolol tartrate (LOPRESSOR) injection 5 mg, 5 mg, Intravenous, Q3H PRN, Anders Simmonds, MD, 5 mg at 05/05/20 1017 .  nystatin (MYCOSTATIN/NYSTOP) topical powder, , Topical, BID, Rosalin Hawking, MD, Given at 05/15/20 0914 .  prochlorperazine (COMPAZINE) injection 10 mg, 10 mg, Intravenous, Q6H PRN, Stoltzfus, Gregory P, DO, 10 mg at 05/06/20 1202 .  senna-docusate (Senokot-S) tablet 1 tablet, 1 tablet, Oral, QHS PRN, Bhagat, Srishti L, MD   Patients Current Diet:     Diet Order                      Diet regular Room service appropriate? No; Fluid consistency: Thin  Diet effective now                    Precautions / Restrictions Precautions Precautions: Fall Precaution Comments: recommend brief for transfers due to incontinence Restrictions Weight Bearing Restrictions: No    Has the patient had 2 or more falls or a fall with injury in the past year?No   Prior Activity Level Community (5-7x/wk): independent and driving   Prior Functional Level Prior Function Level of Independence: Independent Comments: drove, I in ADL's and iADL's   Self Care: Did the patient need help bathing, dressing, using the  toilet or eating?  Independent   Indoor Mobility: Did the patient need assistance with walking from room to room (with or without device)? Independent   Stairs: Did the patient need assistance with internal or external stairs (with or without device)? Independent   Functional Cognition: Did the patient need help planning regular tasks such as shopping or remembering to take medications? Independent   Home Assistive Devices / Equipment Home Assistive Devices/Equipment: None Home Equipment: None   Prior Device Use: Indicate devices/aids used by the patient prior to current illness, exacerbation or injury? None of the above   Current Functional Level Cognition   Overall Cognitive Status: Impaired/Different from baseline Difficult to assess due to: Impaired communication Current Attention Level: Sustained Orientation Level: Oriented to person Following Commands: Follows one step commands inconsistently, Follows one step commands with increased time Safety/Judgement: Decreased awareness of safety, Decreased awareness of deficits General Comments: Pt presents with aphasia and clearly frustrated by this.  Pt required encouragement from son to participate in PT session this pm.    Extremity Assessment (includes Sensation/Coordination)   Upper Extremity Assessment: RUE deficits/detail RUE Deficits / Details: tone noted in hand able to opena nd close hand on command RUE Sensation: decreased proprioception RUE  Coordination: decreased fine motor, decreased gross motor  Lower Extremity Assessment: Defer to PT evaluation RLE Deficits / Details: no voluntary movement, noted associated movement with movement on the L RLE Sensation:  (pt reports similar sensation from L to R side) RLE Coordination: decreased gross motor, decreased fine motor     ADLs   Overall ADL's : Needs assistance/impaired Eating/Feeding: Moderate assistance, Sitting Eating/Feeding Details (indicate cue type and reason): pt  attending to the L side of the tray. pt more fixated on liquids. pt drinking from the milk and juice at the same time with straws Grooming: Brushing hair, Wash/dry face, Minimal assistance, Bed level Grooming Details (indicate cue type and reason): requesting comb for hair when upright in the chair Upper Body Bathing: Maximal assistance, Sitting Lower Body Bathing: Total assistance Upper Body Dressing : Total assistance, Sitting Lower Body Dressing: Total assistance Toilet Transfer: +2 for physical assistance, Moderate assistance Toilet Transfer Details (indicate cue type and reason): simulated EOB to chair -- incontinence of bladder Functional mobility during ADLs: +2 for physical assistance, Total assistance, +2 for safety/equipment General ADL Comments: pt incontience on arrival and incontinence with transfer. recommend brief for all transfers.      Mobility   Overal bed mobility: Needs Assistance Bed Mobility: Rolling, Sidelying to Sit Rolling: Max assist Sidelying to sit: Mod assist Supine to sit: Mod assist, +2 for physical assistance, HOB elevated Sit to supine: Total assist, +2 for physical assistance Sit to sidelying: Total assist, +2 for physical assistance, +2 for safety/equipment General bed mobility comments: Max assistance to roll to L side due to strong tone in R leg.  Pt required decreased assistance elevate trunk.  Once in sitting total assistance to scoot to edge of bed with use of bed pad.     Transfers   Overall transfer level: Needs assistance Equipment used: Ambulation equipment used (sara stedy) Transfers: Sit to/from Stand Sit to Stand: Mod assist Stand pivot transfers: +2 physical assistance, Max assist General transfer comment: Pt required increased time to follow commands during transfer.  Able to reach for sara stedy cross bar to pull into standing.  Pt performed x 3 standing trials with emphasis on hip/trunk/head extension.     Ambulation / Gait / Stairs /  Wheelchair Mobility   Ambulation/Gait Ambulation/Gait assistance:  (NT) General Gait Details: unable     Posture / Balance Dynamic Sitting Balance Sitting balance - Comments: reliant on support of R UE and static sitting min to min guard Balance Overall balance assessment: Needs assistance Sitting-balance support: Bilateral upper extremity supported, Feet supported Sitting balance-Leahy Scale: Fair Sitting balance - Comments: reliant on support of R UE and static sitting min to min guard Postural control: Right lateral lean Standing balance support: Bilateral upper extremity supported, During functional activity Standing balance-Leahy Scale: Poor Standing balance comment: modAx2 and R knee block for static standing with sara stedy     Special needs/care consideration Decreased safety awareness Hgb A1c 6.4 16 FR non latex catheter placed on 05/14/2020    Previous Home Environment  Living Arrangements: Alone  Lives With: Alone Available Help at Discharge: Family, Available PRN/intermittently Type of Home: House Home Layout: One level Home Access: Stairs to enter Entrance Stairs-Rails: Right, Left Entrance Stairs-Number of Steps: 3 Bathroom Shower/Tub: Multimedia programmer: Standard Bathroom Accessibility: Yes How Accessible: Accessible via walker [place]: No Additional Comments: per chart review   Discharge Living Setting Plans for Discharge Living Setting: Lives with (comment) (to move in with  son and his wife) Does the patient have any problems obtaining your medications?: No   Social/Family/Support Systems Contact Information: son, Marcelle Overlie and his wife Anticipated Caregiver: son, daughter in Sports coach and grand daughter Anticipated Caregiver's Contact Information: see above Caregiver Availability: 24/7 Discharge Plan Discussed with Primary Caregiver: Yes Is Caregiver In Agreement with Plan?: Yes Does Caregiver/Family have Issues with  Lodging/Transportation while Pt is in Rehab?: No Granddaughter is PT at [facility]   Goals Patient/Family Goal for Rehab: supervision to min assist with PT, OT, and SLP Expected length of stay: ELOS 2 to 3 weeks Pt/Family Agrees to Admission and willing to participate: Yes Program Orientation Provided & Reviewed with Pt/Caregiver Including Roles  & Responsibilities: Yes   Decrease burden of Care through IP rehab admission: n/a   Possible need for SNF placement upon discharge:not anticipated   Patient Condition: This patient's medical and functional status has changed since the consult dated: 05/09/2020 in which the Rehabilitation Physician determined and documented that the patient's condition is appropriate for intensive rehabilitative care in an inpatient rehabilitation facility. See "History of Present Illness" (above) for medical update. Functional changes are: overall mod to max assist. Patient's medical and functional status update has been discussed with the Rehabilitation physician and patient remains appropriate for inpatient rehabilitation. Will admit to inpatient rehab today 05/15/2020.   Preadmission Screen Completed By:  Cleatrice Burke, RN, 05/15/2020 10:12 AM ______________________________________________________________________   Discussed status with Dr. Ranell Patrick on 05/15/2020 at 43 and received approval for admission today.   Admission Coordinator:  Cleatrice Burke, time 1010 Date 05/15/2020         Cosigned by: Izora Ribas, MD at 05/15/2020 10:19 AM  Revision History                               Note Details  Author Cristina Gong, RN File Time 05/15/2020 10:12 AM  Author Type Rehab Admission Coordinator Status Addendum  Last Editor Cristina Gong, RN Service Physical Medicine and Rehabilitation

## 2020-05-15 NOTE — Progress Notes (Signed)
Inpatient Rehabilitation Admissions Coordinator  Discussed with Dr. Erlinda Hong and Dr. Darrick Meigs. Patient medically ready to d/c to Cir today. I met with patient at bedside ad contacted her son by phone. Son in agreement to Cir today. Acute team and TOC made aware. I will make the arrangements to admit today.  Danne Baxter, RN, MSN Rehab Admissions Coordinator [phone] 05/15/2020 10:03 AM

## 2020-05-15 NOTE — TOC Transition Note (Signed)
Transition of Care [facility]) - CM/SW Discharge Note   Patient Details  Name: Sheila Ray MRN: [id number] Date of Birth: [birth year]  Transition of Care [facility]) CM/SW Contact:  Pollie Friar, RN Phone Number: 05/15/2020, 11:59 AM   Clinical Narrative:    Pt is discharging to CIR today. CM signing off.   Final next level of care: IP Rehab Facility Barriers to Discharge: No Barriers Identified   Patient Goals and CMS Choice        Discharge Placement                       Discharge Plan and Services                                     Social Determinants of Health (SDOH) Interventions     Readmission Risk Interventions No flowsheet data found.

## 2020-05-15 NOTE — Plan of Care (Signed)
  Problem: Education: Goal: Knowledge of disease or condition will improve Outcome: Progressing   Problem: Coping: Goal: Will verbalize positive feelings about self Outcome: Progressing   

## 2020-05-15 NOTE — Progress Notes (Signed)
[facility]  PROGRESS NOTE  Annalise Ethelda Chick IOE:[id number] DOB: [birth year] DOA: 05/03/2020 PCP: Sharion Balloon, FNP   Brief HPI:   *Sheila Ray is an 79 y.o. 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  She did not receive IV TPA due to anticoagulation with Xarelto.  Patient was noted to have mild right leg drift, right facial droop, mild aphasia with CT scan finding consistent with left MCA infarct, patient is now status post thrombectomy.  She was also noted to have A. fib with RVR, cardiology was consulted and Xarelto was switched to Eliquis.  She was started on Cardizem and metoprolol for rate control.   Patient was supposed to be transferred to CIR today, however lab work revealed elevated BUN/creatinine 29/1.92, it was 18/0.82 on 05/09/2020.  CBC showed leukocytosis with WBC 19,000.  UA was clear, chest x-ray did not show significant infiltrate.    Subjective   Patient seen and examined, renal function significantly improved after inserting Foley catheter.  Creatinine down to 1.14.  Renal ultrasound showed mild distention of the right renal pelvis without frank hydronephrosis.   Assessment/Plan:     1. Cellulitis-significantly improved.  Patient had left lower extremity cellulitis, she was started on Augmentin.  Skin erythema and warmth have improved.  WBC is down to 16,000.   Lactic acid was 1.1.  Continue with Augmentin at this time.  Vitals are stable.  Patient can be discharged on Augmentin for total 5 days of treatment. 2. Acute kidney injury-patient had 700 cc urine in the bladder, likely postobstructive AKI.  Creatinine improved after inserting Foley catheter.  Today creatinine was 1.14.  Will discontinue IV fluids, continue Foley catheter, renal ultrasound shows mild  distention of right renal pelvis without frank hydronephrosis.  Consider voiding trial in 3 to 4 days once patient is more ambulatory. 3. Left lower extremity edema-improved, patient started on antibiotics as above.  Venous duplex of lower extremity is negative for DVT. 4. Hypotension-resolved, blood pressure has improved. 5. Atrial fibrillation-heart rate is controlled, continue Cardizem, metoprolol.  Continue anticoagulation with Eliquis. 6. Diabetes mellitus type 2-continue very sensitive sliding scale insulin.  CBG well controlled. 7. Left MCA infarct-management per neurology     COVID-19 Labs  No results for input(s): DDIMER, FERRITIN, LDH, CRP in the last 72 hours.  Lab Results  Component Value Date   [place] NEGATIVE 05/03/2020   [place] Not Detected 03/22/2020     Scheduled medications:   .  stroke: mapping our early stages of recovery book   Does not apply Once  . amoxicillin-clavulanate  1 tablet Oral Q12H  . apixaban  5 mg Oral BID  . Chlorhexidine Gluconate Cloth  6 each Topical Daily  . diltiazem  60 mg Oral Q8H  . ezetimibe  10 mg Oral Daily  . insulin aspart  0-6 Units Subcutaneous TID AC & HS  . ipratropium  1 spray Each Nare BID  . mouth rinse  15 mL Mouth Rinse BID  . metoprolol succinate  100 mg Oral Daily  . nystatin   Topical BID         CBG: Recent Labs  Lab 05/14/20 0644 05/14/20 1239 05/14/20 1547 05/14/20 2119 05/15/20 0623  GLUCAP 106* 166* 104* 101* 93    SpO2: 98 % O2 Flow Rate (L/min): 2 L/min  CBC: Recent Labs  Lab 05/09/20 0353 05/13/20 0317 2020/05/18 0111 05/15/20 0406  WBC 13.8* 19.4* 24.1* 16.0*  NEUTROABS  --   --   --  12.5*  HGB 10.0* 10.4* 10.5* 8.9*  HCT 33.9* 34.1* 34.4* 28.7*  MCV 78.3* 75.8* 76.6* 76.1*  PLT 349 419* 448* 810    Basic Metabolic Panel: Recent Labs  Lab 05/09/20 0353 05/13/20 0317 18-May-2020 0111 05/15/20 0406  NA 135 130* 129* 136  K 3.6 4.1 4.1 3.5  CL 109 101 100 110  CO2  15* 19* 18* 20*  GLUCOSE 115* 123* 131* 101*  BUN 18 29* 32* 19  CREATININE 0.82 1.92* 2.07* 1.14*  CALCIUM 8.8* 8.6* 8.3* 8.0*  MG  --   --  1.8  --      Liver Function Tests: No results for input(s): AST, ALT, ALKPHOS, BILITOT, PROT, ALBUMIN in the last 168 hours.   Antibiotics: Anti-infectives (From admission, onward)   Start     Dose/Rate Route Frequency Ordered Stop   05/15/20 1000  amoxicillin-clavulanate (AUGMENTIN) 875-125 MG per tablet 1 tablet        1 tablet Oral Every 12 hours 05/15/20 0827     05/13/20 1200  amoxicillin-clavulanate (AUGMENTIN) 500-125 MG per tablet 500 mg  Status:  Discontinued        1 tablet Oral Every 12 hours 05/13/20 1102 05/15/20 0827   05/13/20 1145  amoxicillin-clavulanate (AUGMENTIN) 875-125 MG per tablet 1 tablet  Status:  Discontinued        1 tablet Oral Every 12 hours 05/13/20 1057 05/13/20 1101   05/03/20 1501  ceFAZolin (ANCEF) 2-4 GM/100ML-% IVPB       Note to Pharmacy: Arlean Hopping   : cabinet override      05/03/20 1501 05/04/20 0314       DVT prophylaxis: Apixaban  Code Status: Full code  Family Communication: No family at bedside        Objective   Vitals:   05-18-20 1957 05/15/20 0033 05/15/20 0439 05/15/20 0931  BP: 111/65 (!) 108/57 108/64 (!) 123/58  Pulse: 91 78 72 90  Resp: 19 20 18 20   Temp: 98.2 F (36.8 C) 98.1 F (36.7 C) (!) 97.4 F (36.3 C) 97.9 F (36.6 C)  TempSrc: Oral Oral Oral Oral  SpO2: 97% 99% 98% 98%  Weight:      Height:        Intake/Output Summary (Last 24 hours) at 05/15/2020 1018 Last data filed at 05/15/2020 0547 Gross per 24 hour  Intake 240 ml  Output 5050 ml  Net -4810 ml    11/13 1901 - 11/15 0700 In: 420 [P.O.:420] Out: 5450 [Urine:5450]  Filed Weights   05/06/20 1732  Weight: 79.4 kg    Physical Examination:   General-appears in no acute distress Heart-S1-S2, regular, no murmur auscultated Lungs-clear to auscultation bilaterally, no wheezing or  crackles auscultated Abdomen-soft, nontender, no organomegaly Extremities-no edema in the lower extremities Neuro-alert, oriented x3, no focal deficit noted      BNP (last 3 results) Recent Labs    12/28/19 1136  BNP 207.3*     Studies:  US RENAL  Result Date: May 18, 2020 CLINICAL DATA:  Acute renal insufficiency EXAM: RENAL / URINARY TRACT ULTRASOUND COMPLETE COMPARISON:  None. FINDINGS: Right Kidney: Renal measurements: 10.3 by 6.3 x 4.7 cm = volume: 157.6 mL. Echogenicity within normal limits. No mass or hydronephrosis visualized. Mild prominence of the right renal pelvis is nonspecific. Left Kidney: Renal measurements: 10.2  x 6.1 by 4.9 cm = volume: 161.3 mL. Echogenicity within normal limits. No mass or hydronephrosis visualized. Bladder: Decompressed and not evaluated. Other: None. IMPRESSION: 1. Mild distension of the right renal pelvis without frank hydronephrosis. Otherwise unremarkable exam. Electronically Signed   By: Randa Ngo M.D.   On: 05/14/2020 15:47   VAS [place] LOWER EXTREMITY VENOUS (DVT)  Result Date: 05/14/2020  Lower Venous DVT Study Indications: Swelling.  Risk Factors: None identified. Limitations: Body habitus, poor ultrasound/tissue interface and patient positioning, patient immobility. Comparison Study: No prior studies. Performing Technologist: Oliver Hum RVT  Examination Guidelines: A complete evaluation includes B-mode imaging, spectral Doppler, color Doppler, and power Doppler as needed of all accessible portions of each vessel. Bilateral testing is considered an integral part of a complete examination. Limited examinations for reoccurring indications may be performed as noted. The reflux portion of the exam is performed with the patient in reverse Trendelenburg.  +---------+---------------+---------+-----------+----------+--------------+ RIGHT    CompressibilityPhasicitySpontaneityPropertiesThrombus Aging  +---------+---------------+---------+-----------+----------+--------------+ CFV      Full           Yes      Yes                                 +---------+---------------+---------+-----------+----------+--------------+ SFJ      Full                                                        +---------+---------------+---------+-----------+----------+--------------+ FV Prox  Full                                                        +---------+---------------+---------+-----------+----------+--------------+ FV Mid   Full                                                        +---------+---------------+---------+-----------+----------+--------------+ FV DistalFull                                                        +---------+---------------+---------+-----------+----------+--------------+ PFV      Full                                                        +---------+---------------+---------+-----------+----------+--------------+ POP      Full           Yes      Yes                                 +---------+---------------+---------+-----------+----------+--------------+ PTV      Full                                                        +---------+---------------+---------+-----------+----------+--------------+  PERO     Full                                                        +---------+---------------+---------+-----------+----------+--------------+   +---------+---------------+---------+-----------+----------+-------------------+ LEFT     CompressibilityPhasicitySpontaneityPropertiesThrombus Aging      +---------+---------------+---------+-----------+----------+-------------------+ CFV      Full           Yes      Yes                                      +---------+---------------+---------+-----------+----------+-------------------+ SFJ      Full                                                              +---------+---------------+---------+-----------+----------+-------------------+ FV Prox  Full                                                             +---------+---------------+---------+-----------+----------+-------------------+ FV Mid   Full                                                             +---------+---------------+---------+-----------+----------+-------------------+ FV Distal                                             Not well visualized +---------+---------------+---------+-----------+----------+-------------------+ PFV      Full                                                             +---------+---------------+---------+-----------+----------+-------------------+ POP      Full           Yes      Yes                                      +---------+---------------+---------+-----------+----------+-------------------+ PTV      Full                                                             +---------+---------------+---------+-----------+----------+-------------------+ PERO  Not well visualized +---------+---------------+---------+-----------+----------+-------------------+     Summary: RIGHT: - No evidence of common femoral vein obstruction.  LEFT: - There is no evidence of deep vein thrombosis in the lower extremity. However, portions of this examination were limited- see technologist comments above.  - No cystic structure found in the popliteal fossa.  *See table(s) above for measurements and observations.    Preliminary        Oswald Hillock   [facility] If 7PM-7AM, please contact night-coverage at [email or web address], Office  [phone]   05/15/2020, 10:18 AM  LOS: 12 days

## 2020-05-15 NOTE — Discharge Summary (Addendum)
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 Arthritis   . Atrial fibrillation ([place])   . Complication of anesthesia   . Diabetes mellitus without complication ([place])   . Diverticulosis   . Fatigue   . Gastric polyp   . Goiter   . Hyperlipidemia   . Hypertension   . Hypothyroid    taken off of thyroid medication 2012014   . Obese   . Osteopenia   . PONV (postoperative nausea and vomiting)   . Postmenopausal   . Rosacea   . Stroke ([place]) 01/15/2013   left thalamic  stroke, small vessel  . Vitamin D deficiency    Past Surgical History:  Procedure Laterality Date  . ABDOMINAL HYSTERECTOMY  1985  . FOOT SURGERY Right 08/2002  . IR CT HEAD LTD  05/03/2020  . IR PERCUTANEOUS ART THROMBECTOMY/INFUSION INTRACRANIAL INC DIAG ANGIO  05/03/2020  . RADIOLOGY WITH ANESTHESIA N/A 05/03/2020   Procedure: IR WITH ANESTHESIA;  Surgeon: Radiologist, Medication, MD;  Location: [facility];  Service: Radiology;  Laterality: N/A;  . right knee arthroscopy   2013   . TONSILLECTOMY    . 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   Procedure: RIGHT TOTAL KNEE ARTHROPLASTY;  Surgeon: Gearlean Alf, MD;  Location: [facility];  Service: Orthopedics;  Laterality: Right;    HOSPITAL MEDICATIONS .  stroke: mapping our early stages of recovery book   Does not apply Once  .  amoxicillin-clavulanate  1 tablet Oral Q12H  . apixaban  5 mg Oral BID  . Chlorhexidine Gluconate Cloth  6 each Topical Daily  . diltiazem  60 mg Oral Q8H  . ezetimibe  10 mg Oral Daily  . insulin aspart  0-6 Units Subcutaneous TID AC & HS  . ipratropium  1 spray Each Nare BID  . mouth rinse  15 mL Mouth Rinse BID  . metoprolol succinate  100 mg Oral Daily  . nystatin   Topical BID    HOME MEDICATIONS PRIOR TO ADMISSION Medications Prior to Admission  Medication Sig Dispense Refill  . alendronate (FOSAMAX) 70 MG tablet TAKE 1 TABLET BY MOUTH  WEEKLY AS DIRECTED (Patient taking differently: Take 70 mg by mouth once a week. ) 12 tablet 3  . Cyanocobalamin (VITAMIN B12) 1000 MCG TBCR Take 1,000 mcg by mouth daily.     Marland Kitchen lisinopril (ZESTRIL) 40 MG tablet TAKE 1 TABLET BY MOUTH  DAILY (Patient taking differently: Take 40 mg by mouth daily. ) 90 tablet 3  . Magnesium Oxide (MAG-OX 400 PO) Take 400 mg by mouth daily.    . metFORMIN (GLUCOPHAGE) 1000 MG tablet Take 0.5 tablets (500 mg total) by mouth daily with breakfast. 90 tablet 0  . metoprolol succinate (TOPROL-XL) 50 MG 24 hr tablet TAKE 1 TABLET BY MOUTH  DAILY WITH OR IMMEDIATELY  FOLLOWING A MEAL (Patient taking differently: Take 37.5 mg by mouth in the morning and at bedtime. ) 90 tablet 0  . XARELTO 20 MG TABS tablet TAKE 1 TABLET BY MOUTH  DAILY (Patient taking differently: Take 20 mg by mouth daily with supper. ) 90 tablet 0  . diltiazem (CARDIZEM CD) 240 MG 24 hr capsule TAKE 1 CAPSULE BY MOUTH  DAILY (Patient taking differently: Take 240 mg by mouth daily. ) 90 capsule 3  . glucose blood (ONE TOUCH ULTRA TEST) test strip USE TO CHECK BLOOD GLUCOSE  ONCE A DAY AS INSTRUCTED 100 each 6  . ONETOUCH DELICA LANCETS 19F MISC 1 each by Does not apply route daily. Use to check BG daily 100 each 2     LABORATORY STUDIES CBC    Component Value Date/Time   WBC 16.0 (H) 05/15/2020 0406   RBC 3.77 (L) 05/15/2020 0406   HGB 8.9 (L)  05/15/2020 0406   HGB 13.1 09/16/2019 1136   HCT 28.7 (L) 05/15/2020 0406   HCT 41.4 09/16/2019 1136   PLT 341 05/15/2020 0406   PLT 333 09/16/2019 1136   MCV 76.1 (L) 05/15/2020 0406   MCV 80 09/16/2019 1136   MCH 23.6 (L) 05/15/2020 0406   MCHC 31.0 05/15/2020 0406   RDW 16.8 (H) 05/15/2020 0406   RDW 13.9 09/16/2019 1136   LYMPHSABS 1.1 05/15/2020 0406   LYMPHSABS 2.0 09/16/2019 1136   MONOABS 1.9 (H) 05/15/2020 0406   EOSABS 0.2 05/15/2020 0406   EOSABS 0.6 (H) 09/16/2019 1136   BASOSABS 0.0 05/15/2020 0406   BASOSABS 0.1 09/16/2019 1136   CMP    Component Value Date/Time   NA 136 05/15/2020 0406   NA 141 12/28/2019 1136   K 3.5 05/15/2020 0406   CL 110 05/15/2020 0406   CO2 20 (L) 05/15/2020 0406   GLUCOSE 101 (H) 05/15/2020 0406   BUN 19 05/15/2020 0406   BUN 12 12/28/2019 1136   CREATININE 1.14 (H) 05/15/2020 0406   CREATININE 0.75 01/06/2013 1354   CALCIUM 8.0 (L) 05/15/2020 0406   PROT 5.7 (L) 05/03/2020 1317   PROT 6.3 09/16/2019 1136   ALBUMIN 3.0 (L) 05/03/2020 1317   ALBUMIN 4.0 09/16/2019 1136   AST 15 05/03/2020 1317   ALT 12 05/03/2020 1317   ALKPHOS 52 05/03/2020 1317   BILITOT 0.5 05/03/2020 1317   BILITOT 0.2 09/16/2019 1136   GFRNONAA 49 (L) 05/15/2020 0406   GFRNONAA 80 01/06/2013 1354   GFRAA >60 02/07/2020 1119   GFRAA >89 01/06/2013 1354   COAGS Lab Results  Component Value Date   INR 1.2 05/03/2020   INR 3.3 (H) 02/07/2020   INR 0.94 07/19/2014   Lipid Panel    Component Value Date/Time   CHOL 173 05/04/2020 0317   CHOL 226 (H) 09/16/2019 1136   CHOL 173 01/06/2013 1354   TRIG 65 05/04/2020 0317   TRIG 120 05/06/2013 1341   TRIG 95 01/06/2013 1354   HDL 36 (L) 05/04/2020 0317   HDL 43 09/16/2019 1136   HDL 47 05/06/2013 1341   HDL 41 01/06/2013 1354   CHOLHDL 4.8 05/04/2020 0317   VLDL 13 05/04/2020 0317   LDLCALC 124 (H) 05/04/2020 0317   LDLCALC 158 (H) 09/16/2019 1136   LDLCALC 109 (H) 05/06/2013 1341   LDLCALC 113  (H) 01/06/2013 1354   HgbA1C  Lab Results  Component Value Date   HGBA1C 6.4 (H) 05/04/2020   Urinalysis  Component Value Date/Time   COLORURINE YELLOW 05/13/2020 0046   APPEARANCEUR HAZY (A) 05/13/2020 0046   LABSPEC 1.010 05/13/2020 0046   PHURINE 5.0 05/13/2020 0046   GLUCOSEU NEGATIVE 05/13/2020 0046   HGBUR NEGATIVE 05/13/2020 0046   BILIRUBINUR NEGATIVE 05/13/2020 0046   KETONESUR NEGATIVE 05/13/2020 0046   PROTEINUR NEGATIVE 05/13/2020 0046   UROBILINOGEN 0.2 07/19/2014 0950   NITRITE NEGATIVE 05/13/2020 0046   LEUKOCYTESUR SMALL (A) 05/13/2020 0046   Urine Drug Screen No results found for: LABOPIA, COCAINSCRNUR, LABBENZ, AMPHETMU, THCU, LABBARB  Alcohol Level    Component Value Date/Time   [facility] <10 05/03/2020 1317     SIGNIFICANT DIAGNOSTIC STUDIES EEG  Result Date: 05/04/2020 Lora Havens, MD     05/04/2020  1:46 PM Patient Name: TORRE PIKUS MRN: [id number] Epilepsy Attending: Lora Havens Referring Physician/Provider: Dr Kerney Elbe Date: 05/04/2020 Duration: 23.54 mins Patient history: 79 year old female with left MCA stroke s/p revascularization, with AMS. EEG to evaluate for seizure. Level of alertness: Awake AEDs during EEG study: None Technical aspects: This EEG study was done with scalp electrodes positioned according to the 10-20 International system of electrode placement. Electrical activity was acquired at a sampling rate of 500Hz  and reviewed with a high frequency filter of 70Hz  and a low frequency filter of 1Hz . EEG data were recorded continuously and digitally stored. Description: The posterior dominant rhythm consists of 9 Hz activity of moderate voltage (25-35 uV) seen predominantly in posterior head regions, asymmetric ( L<R) and reactive to eye opening and eye closing.  EEG showed continuous 3 to 5 Hz theta-delta slowing.  Left hemisphere, maximal left temporal region which at times appeared sharply contoured.  Hyperventilation and photic  stimulation were not performed.   ABNORMALITY -Continuous slow, lateralized left hemisphere, maximal left temporal region -Background asymmetry, left<right IMPRESSION: This study is suggestive of cortical dysfunction in left hemisphere, maximal left temporal region consistent with underlying stroke.  No seizures or definite epileptiform discharges were seen throughout the recording. If suspicion for interictal activity remains a concern, a prolonged study can be considered. Lora Havens   CT Angio Head W or Wo Contrast  Addendum Date: 05/03/2020   ADDENDUM REPORT: 05/03/2020 14:38 ADDENDUM: Exam and technique should not include CT perfusion, which was performed subsequently and dictated separately. Only CT head without contrast and CT angiography of the head and neck with contrast performed. Electronically Signed   By: Macy Mis M.D.   On: 05/03/2020 14:38   Result Date: 05/03/2020 CLINICAL DATA:  Code stroke.  Right-sided weakness EXAM: CT HEAD WITHOUT CONTRAST CT ANGIOGRAPHY HEAD AND NECK CT PERFUSION BRAIN TECHNIQUE: Contiguous axial images were obtained the skull base to the vertex without contrast. Multidetector CT imaging of the head and neck was performed using the standard protocol during bolus administration of intravenous contrast. Multiplanar CT image reconstructions and MIPs were obtained to evaluate the vascular anatomy. Carotid stenosis measurements (when applicable) are obtained utilizing NASCET criteria, using the distal internal carotid diameter as the denominator. Multiphase CT imaging of the brain was performed following IV bolus contrast injection. Subsequent parametric perfusion maps were calculated using RAPID software. CONTRAST:  58mL OMNIPAQUE IOHEXOL 350 MG/ML SOLN COMPARISON:  None. FINDINGS: CT HEAD Brain: There is no acute intracranial hemorrhage, mass effect, or edema. Gray-white differentiation is preserved. Patchy and confluent areas hypoattenuation in the supratentorial  white but may reflect moderate chronic microvascular ischemic changes. There is no extra-axial fluid collection. Prominence of the ventricles and sulci  reflects generalized parenchymal volume loss. Vascular: No hyperdense vessel. Skull: Calvarium is unremarkable. Sinuses/Orbits: No acute finding. Other: None. ASPECTS ([place] Stroke Program Early CT Score) - Ganglionic level infarction (caudate, lentiform nuclei, internal capsule, insula, M1-M3 cortex): 7 - Supraganglionic infarction (M4-M6 cortex): 3 Total score (0-10 with 10 being normal): 10 CTA NECK Aortic arch: Great vessel origins are patent. Right carotid system: Patent. Mild calcified plaque at the ICA origin causing minimal stenosis. Left carotid system: Patent. Minimal calcified plaque at the proximal ICA without measurable stenosis. Vertebral arteries: Patent.  Right vertebral artery is dominant. Skeleton: Degenerative changes of the cervical spine primarily C6-C7. Other neck: Heterogeneous, multinodular thyroid. Further ultrasound follow-up is not recommended by current guidelines. Upper chest: Partially imaged small bilateral pleural effusions. Peribronchial and interstitial thickening likely reflecting interstitial edema. Review of the MIP images confirms the above findings CTA HEAD Anterior circulation: Intracranial internal carotid arteries are patent with mild calcified plaque. Left M1 MCA is patent. There is occlusion of a M2 MCA branch approximately 13 mm from the MCA trifurcation (see saved key image series 11, 27). There is partial reconstitution followed by occlusion and subsequent distal M3 reconstitution. Right middle and both anterior cerebral arteries are patent. Posterior circulation: Intracranial vertebral arteries are patent with minimal calcified plaque on the right. Basilar artery is patent. Posterior cerebral arteries are patent. There are bilateral posterior communicating arteries present. Venous sinuses: Patent as allowed by  contrast bolus timing. Review of the MIP images confirms the above findings IMPRESSION: No acute intracranial hemorrhage. No acute infarction identified. ASPECT score is 10. 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 No hemodynamically significant stenosis the neck. Partially imaged mild interstitial pulmonary edema and small bilateral pleural effusions. Results were called by telephone at the time of interpretation on 05/03/2020 at 1:09 pm and again at 1:17 p.m. to provider [facility] , who verbally acknowledged these results. Electronically Signed: By: Macy Mis M.D. On: 05/03/2020 13:32   CT Angio Neck W and/or Wo Contrast  Addendum Date: 05/03/2020   ADDENDUM REPORT: 05/03/2020 14:38 ADDENDUM: Exam and technique should not include CT perfusion, which was performed subsequently and dictated separately. Only CT head without contrast and CT angiography of the head and neck with contrast performed. Electronically Signed   By: Macy Mis M.D.   On: 05/03/2020 14:38   Result Date: 05/03/2020 CLINICAL DATA:  Code stroke.  Right-sided weakness EXAM: CT HEAD WITHOUT CONTRAST CT ANGIOGRAPHY HEAD AND NECK CT PERFUSION BRAIN TECHNIQUE: Contiguous axial images were obtained the skull base to the vertex without contrast. Multidetector CT imaging of the head and neck was performed using the standard protocol during bolus administration of intravenous contrast. Multiplanar CT image reconstructions and MIPs were obtained to evaluate the vascular anatomy. Carotid stenosis measurements (when applicable) are obtained utilizing NASCET criteria, using the distal internal carotid diameter as the denominator. Multiphase CT imaging of the brain was performed following IV bolus contrast injection. Subsequent parametric perfusion maps were calculated using RAPID software. CONTRAST:  23mL OMNIPAQUE IOHEXOL 350 MG/ML SOLN COMPARISON:  None. FINDINGS: CT HEAD Brain: There is no acute intracranial  hemorrhage, mass effect, or edema. Gray-white differentiation is preserved. Patchy and confluent areas hypoattenuation in the supratentorial white but may reflect moderate chronic microvascular ischemic changes. There is no extra-axial fluid collection. Prominence of the ventricles and sulci reflects generalized parenchymal volume loss. Vascular: No hyperdense vessel. Skull: Calvarium is unremarkable. Sinuses/Orbits: No acute finding. Other: None. ASPECTS [facility] Stroke Program  Early CT Score) - Ganglionic level infarction (caudate, lentiform nuclei, internal capsule, insula, M1-M3 cortex): 7 - Supraganglionic infarction (M4-M6 cortex): 3 Total score (0-10 with 10 being normal): 10 CTA NECK Aortic arch: Great vessel origins are patent. Right carotid system: Patent. Mild calcified plaque at the ICA origin causing minimal stenosis. Left carotid system: Patent. Minimal calcified plaque at the proximal ICA without measurable stenosis. Vertebral arteries: Patent.  Right vertebral artery is dominant. Skeleton: Degenerative changes of the cervical spine primarily C6-C7. Other neck: Heterogeneous, multinodular thyroid. Further ultrasound follow-up is not recommended by current guidelines. Upper chest: Partially imaged small bilateral pleural effusions. Peribronchial and interstitial thickening likely reflecting interstitial edema. Review of the MIP images confirms the above findings CTA HEAD Anterior circulation: Intracranial internal carotid arteries are patent with mild calcified plaque. Left M1 MCA is patent. There is occlusion of a M2 MCA branch approximately 13 mm from the MCA trifurcation (see saved key image series 11, 27). There is partial reconstitution followed by occlusion and subsequent distal M3 reconstitution. Right middle and both anterior cerebral arteries are patent. Posterior circulation: Intracranial vertebral arteries are patent with minimal calcified plaque on the right. Basilar artery is patent.  Posterior cerebral arteries are patent. There are bilateral posterior communicating arteries present. Venous sinuses: Patent as allowed by contrast bolus timing. Review of the MIP images confirms the above findings IMPRESSION: No acute intracranial hemorrhage. No acute infarction identified. ASPECT score is 10. 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 No hemodynamically significant stenosis the neck. Partially imaged mild interstitial pulmonary edema and small bilateral pleural effusions. Results were called by telephone at the time of interpretation on 05/03/2020 at 1:09 pm and again at 1:17 p.m. to provider [facility] , who verbally acknowledged these results. Electronically Signed: By: Macy Mis M.D. On: 05/03/2020 13:32   MR ANGIO HEAD WO CONTRAST  Result Date: 05/04/2020 CLINICAL DATA:  Stroke, follow-up. EXAM: MRI HEAD WITHOUT CONTRAST MRA HEAD WITHOUT CONTRAST TECHNIQUE: Multiplanar, multiecho pulse sequences of the brain and surrounding structures were obtained without intravenous contrast. Angiographic images of the head were obtained using MRA technique without contrast. COMPARISON:  CT/CT angiogram of the head and neck May 03, 2020. FINDINGS: MRI HEAD FINDINGS Brain: Cortical restricted diffusion is seen within the left temporal occipital region. Scattered foci of restricted diffusion are also seen in the left frontoparietal and insular regions and left amygdala. Focus of susceptibility artifact in the left temporal region may represent residual clot in a distal branch versus small focus of hemorrhage. No hemorrhagic transformation, significant mass effect or midline shift. No hydrocephalus or mass lesion. Scattered and confluent foci of T2 hyperintensity are seen within the white matter of the cerebral hemispheres, nonspecific, most likely related to chronic small vessel ischemia. 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 Moderate parenchymal volume  loss. Vascular: Normal flow voids. Skull and upper cervical spine: Normal marrow signal. Sinuses/Orbits: Mild mucosal thickening of the ethmoid cells. The orbits are maintained. MRA HEAD FINDINGS The study is partially degraded by motion. Normal flow related enhancement is seen within the upper cervical and intracranial segment of the bilateral ICAs. Moderate stenosis of the right carotid terminus is likely artifactual given no stenosis on recent CTA. The bilateral M1-M2/M segments and bilateral A1-A2/ACA segments have normal flow related enhancement. Hypoplastic left vertebral artery with a dominant right vertebral artery with normal flow related enhancement. The basilar artery and bilateral posterior cerebral arteries are also patent. Luminal irregularity along the  bilateral PCAs is suggestive of intracranial atherosclerotic disease. No proximal occlusion or stenosis, aneurysm or vascular malformation. IMPRESSION: 1. Cortical restricted diffusion within the left temporal occipital region. Scattered foci of restricted diffusion are also seen in the left frontoparietal and insular regions and left amygdala. Findings are consistent with acute infarcts. No hemorrhagic transformation, significant mass effect or midline shift. 2. No proximal occlusion or stenosis in the anterior or posterior circulation. Results communicated to Dr. Lottie Rater via [facility] pager system. Electronically Signed   By: Pedro Earls M.D.   On: 05/04/2020 14:19   MR ANGIO HEAD WO CONTRAST  Result Date: 05/03/2020 CLINICAL DATA:  Follow-up examination for acute stroke. Patient with previously identified left M2 branch occlusion, superior division. Status post catheter directed intervention achieving TICI2C revascularization. Recanalized A2/A3 filling defect also noted on arteriogram. EXAM: MRI HEAD WITHOUT CONTRAST MRA HEAD WITHOUT CONTRAST TECHNIQUE: Multiplanar, multiecho pulse sequences of the brain and surrounding  structures were obtained without intravenous contrast. Angiographic images of the head were obtained using MRA technique without contrast. COMPARISON:  Prior CTs from earlier the same day. FINDINGS: MRI HEAD FINDINGS Brain: Examination mildly degraded by motion artifact. Generalized age-related cerebral atrophy. Patchy and confluent T2/FLAIR hyperintensity within the periventricular, deep, and subcortical white matter both cerebral hemispheres, nonspecific, but most like related chronic microvascular ischemic disease, moderate in nature. Superimposed remote lacunar infarct at the left corona radiata/basal ganglia. Patchy areas of restricted diffusion seen involving the cortical gray matter of the left insula, left temporal occipital region, and mesial left temporal lobe are seen, consistent with small volume acute ischemic infarcts (series 5, images 70, 69, 67). These are predominantly left MCA distribution. Additional patchy small volume cortical infarcts noted at the high left frontoparietal region (series 5, images 91, 85), left ACA and MCA distribution. No associated mass effect. Single small focus of associated petechial hemorrhage present at the left temporal region (series 18, image 27). No other evidence for acute or subacute ischemia. Gray-white matter differentiation otherwise maintained. No mass lesion, mass effect, or midline shift. No hydrocephalus or extra-axial fluid collection. Pituitary gland and suprasellar region within normal limits. Vascular: Major intracranial vascular flow voids are maintained. Skull and upper cervical spine: Craniocervical junction within normal limits. Bone marrow signal intensity normal. No scalp soft tissue abnormality. Sinuses/Orbits: Globes and orbital soft tissues within normal limits. Paranasal sinuses are clear. No mastoid effusion. Other: None. MRA HEAD FINDINGS ANTERIOR CIRCULATION: Examination degraded by motion artifact. Visualized distal cervical segments of the  internal carotid arteries are grossly patent with antegrade flow. Apparent short-segment defect at the distal cervical left ICA felt to be due to motion artifact, as no stenosis or other defect seen within this region on corresponding arteriogram. Petrous, cavernous, and supraclinoid segments of both internal carotid arteries patent without appreciable stenosis or other abnormality. A1 segments patent bilaterally. Grossly normal anterior communicating artery complex. Partially visualized ACAs perfused to their distal aspects without appreciable stenosis. M1 segments patent bilaterally. Left MCA trifurcations. Previously identified occluded left M2 branch appears grossly patent status post revascularization. MCA branches otherwise well perfused bilaterally. POSTERIOR CIRCULATION: Right vertebral artery strongly dominant and patent to the vertebrobasilar junction. Right PICA not definitely seen. Left vertebral artery hypoplastic and largely terminates in PICA, although a small branch seen ascending towards the vertebrobasilar junction. Left PICA patent proximally. Basilar diffusely irregular but is patent to its distal aspect without high-grade stenosis. Superior cerebral arteries grossly patent proximally. Both PCAs are patent proximally, not well assessed  distally due to motion. No intracranial aneurysm. IMPRESSION: MRI HEAD IMPRESSION: 1. 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 Associated minimal petechial hemorrhage at the left temporal region without significant mass effect. 2. Underlying age-related cerebral atrophy with moderate chronic microvascular ischemic disease. MRA HEAD IMPRESSION: 1. Technically limited exam due to motion artifact. 2. Previously identified occluded left M2 branch appears grossly patent status post revascularization. 3. Otherwise grossly stable and negative intracranial MRA. No other large vessel occlusion. No  other hemodynamically significant or correctable stenosis. Electronically Signed   By: Jeannine Boga M.D.   On: 05/03/2020 22:06   MR BRAIN WO CONTRAST  Result Date: 05/04/2020 CLINICAL DATA:  Stroke, follow-up. EXAM: MRI HEAD WITHOUT CONTRAST MRA HEAD WITHOUT CONTRAST TECHNIQUE: Multiplanar, multiecho pulse sequences of the brain and surrounding structures were obtained without intravenous contrast. Angiographic images of the head were obtained using MRA technique without contrast. COMPARISON:  CT/CT angiogram of the head and neck May 03, 2020. FINDINGS: MRI HEAD FINDINGS Brain: Cortical restricted diffusion is seen within the left temporal occipital region. Scattered foci of restricted diffusion are also seen in the left frontoparietal and insular regions and left amygdala. Focus of susceptibility artifact in the left temporal region may represent residual clot in a distal branch versus small focus of hemorrhage. No hemorrhagic transformation, significant mass effect or midline shift. No hydrocephalus or mass lesion. Scattered and confluent foci of T2 hyperintensity are seen within the white matter of the cerebral hemispheres, nonspecific, most likely related to chronic small vessel ischemia. 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 Moderate parenchymal volume loss. Vascular: Normal flow voids. Skull and upper cervical spine: Normal marrow signal. Sinuses/Orbits: Mild mucosal thickening of the ethmoid cells. The orbits are maintained. MRA HEAD FINDINGS The study is partially degraded by motion. Normal flow related enhancement is seen within the upper cervical and intracranial segment of the bilateral ICAs. Moderate stenosis of the right carotid terminus is likely artifactual given no stenosis on recent CTA. The bilateral M1-M2/M segments and bilateral A1-A2/ACA segments have normal flow related enhancement. Hypoplastic left vertebral artery with a dominant right vertebral artery with  normal flow related enhancement. The basilar artery and bilateral posterior cerebral arteries are also patent. Luminal irregularity along the bilateral PCAs is suggestive of intracranial atherosclerotic disease. No proximal occlusion or stenosis, aneurysm or vascular malformation. IMPRESSION: 1. Cortical restricted diffusion within the left temporal occipital region. Scattered foci of restricted diffusion are also seen in the left frontoparietal and insular regions and left amygdala. Findings are consistent with acute infarcts. No hemorrhagic transformation, significant mass effect or midline shift. 2. No proximal occlusion or stenosis in the anterior or posterior circulation. Results communicated to Dr. Lottie Rater via [facility] pager system. Electronically Signed   By: Pedro Earls M.D.   On: 05/04/2020 14:19   MR BRAIN WO CONTRAST  Result Date: 05/03/2020 CLINICAL DATA:  Follow-up examination for acute stroke. Patient with previously identified left M2 branch occlusion, superior division. Status post catheter directed intervention achieving TICI2C revascularization. Recanalized A2/A3 filling defect also noted on arteriogram. EXAM: MRI HEAD WITHOUT CONTRAST MRA HEAD WITHOUT CONTRAST TECHNIQUE: Multiplanar, multiecho pulse sequences of the brain and surrounding structures were obtained without intravenous contrast. Angiographic images of the head were obtained using MRA technique without contrast. COMPARISON:  Prior CTs from earlier the same day. FINDINGS: MRI HEAD FINDINGS Brain: Examination mildly degraded by motion artifact. Generalized age-related cerebral atrophy. Patchy and  confluent T2/FLAIR hyperintensity within the periventricular, deep, and subcortical white matter both cerebral hemispheres, nonspecific, but most like related chronic microvascular ischemic disease, moderate in nature. Superimposed remote lacunar infarct at the left corona radiata/basal ganglia. Patchy areas of restricted  diffusion seen involving the cortical gray matter of the left insula, left temporal occipital region, and mesial left temporal lobe are seen, consistent with small volume acute ischemic infarcts (series 5, images 70, 69, 67). These are predominantly left MCA distribution. Additional patchy small volume cortical infarcts noted at the high left frontoparietal region (series 5, images 91, 85), left ACA and MCA distribution. No associated mass effect. Single small focus of associated petechial hemorrhage present at the left temporal region (series 18, image 27). No other evidence for acute or subacute ischemia. Gray-white matter differentiation otherwise maintained. No mass lesion, mass effect, or midline shift. No hydrocephalus or extra-axial fluid collection. Pituitary gland and suprasellar region within normal limits. Vascular: Major intracranial vascular flow voids are maintained. Skull and upper cervical spine: Craniocervical junction within normal limits. Bone marrow signal intensity normal. No scalp soft tissue abnormality. Sinuses/Orbits: Globes and orbital soft tissues within normal limits. Paranasal sinuses are clear. No mastoid effusion. Other: None. MRA HEAD FINDINGS ANTERIOR CIRCULATION: Examination degraded by motion artifact. Visualized distal cervical segments of the internal carotid arteries are grossly patent with antegrade flow. Apparent short-segment defect at the distal cervical left ICA felt to be due to motion artifact, as no stenosis or other defect seen within this region on corresponding arteriogram. Petrous, cavernous, and supraclinoid segments of both internal carotid arteries patent without appreciable stenosis or other abnormality. A1 segments patent bilaterally. Grossly normal anterior communicating artery complex. Partially visualized ACAs perfused to their distal aspects without appreciable stenosis. M1 segments patent bilaterally. Left MCA trifurcations. Previously identified occluded  left M2 branch appears grossly patent status post revascularization. MCA branches otherwise well perfused bilaterally. POSTERIOR CIRCULATION: Right vertebral artery strongly dominant and patent to the vertebrobasilar junction. Right PICA not definitely seen. Left vertebral artery hypoplastic and largely terminates in PICA, although a small branch seen ascending towards the vertebrobasilar junction. Left PICA patent proximally. Basilar diffusely irregular but is patent to its distal aspect without high-grade stenosis. Superior cerebral arteries grossly patent proximally. Both PCAs are patent proximally, not well assessed distally due to motion. No intracranial aneurysm. IMPRESSION: MRI HEAD IMPRESSION: 1. 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 Associated minimal petechial hemorrhage at the left temporal region without significant mass effect. 2. Underlying age-related cerebral atrophy with moderate chronic microvascular ischemic disease. MRA HEAD IMPRESSION: 1. Technically limited exam due to motion artifact. 2. Previously identified occluded left M2 branch appears grossly patent status post revascularization. 3. Otherwise grossly stable and negative intracranial MRA. No other large vessel occlusion. No other hemodynamically significant or correctable stenosis. Electronically Signed   By: Jeannine Boga M.D.   On: 05/03/2020 22:06   US RENAL  Result Date: 05/14/2020 CLINICAL DATA:  Acute renal insufficiency EXAM: RENAL / URINARY TRACT ULTRASOUND COMPLETE COMPARISON:  None. FINDINGS: Right Kidney: Renal measurements: 10.3 by 6.3 x 4.7 cm = volume: 157.6 mL. Echogenicity within normal limits. No mass or hydronephrosis visualized. Mild prominence of the right renal pelvis is nonspecific. Left Kidney: Renal measurements: 10.2 x 6.1 by 4.9 cm = volume: 161.3 mL. Echogenicity within normal limits. No mass or hydronephrosis visualized.  Bladder: Decompressed and not evaluated. Other: None. IMPRESSION: 1. Mild distension of the right renal pelvis  without frank hydronephrosis. Otherwise unremarkable exam. Electronically Signed   By: Randa Ngo M.D.   On: 05/14/2020 15:47   IR CT Head Ltd  Result Date: 05/05/2020 INDICATION: New onset of aphasia, slurred speech, right facial weakness, and right leg weakness. 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 EXAM: 1. EMERGENT LARGE VESSEL OCCLUSION THROMBOLYSIS (anterior CIRCULATION) COMPARISON:  CT angiogram of the head and neck May 03, 2020. MEDICATIONS: Ancef 2 g IV antibiotic was administered within 1 hour of the procedure. ANESTHESIA/SEDATION: General anesthesia CONTRAST:  Isovue 300 approximately 80 mL FLUOROSCOPY TIME:  Fluoroscopy Time: 38 minutes 30 seconds (1809 mGy). COMPLICATIONS: None immediate. TECHNIQUE: Following a full explanation of the procedure along with the potential associated complications, an informed witnessed consent was obtained. The risks of intracranial hemorrhage of 10%, worsening neurological deficit, ventilator dependency, death and inability to revascularize were all reviewed in detail with the patient's . The patient was then put under general anesthesia by the Department of Anesthesiology at [facility]. The right groin was prepped and draped in the usual sterile fashion. Thereafter using modified Seldinger technique, transfemoral access into the right common femoral artery was obtained without difficulty. Over a 0.035 inch guidewire an 8 French 25 cm Pinnacle sheath was inserted. Through this, and also over a 0.035 inch guidewire a 5 [place] JB 1 catheter was advanced to the aortic arch region and selectively positioned in the left common carotid artery. An arteriogram was then performed centered extra cranially and intracranially. FINDINGS: The left common carotid  arteriogram demonstrates the left external carotid artery and its major branches to be widely patent. The left internal carotid artery at the bulb to the cranial skull base also demonstrates wide patency. The petrous, the cavernous and the supraclinoid segments are widely patent. A small infundibulum is seen at the origin of the left posterior communicating artery. Also observed is the broad-based saccular outpouching along the posterior wall of the left internal carotid artery opposite the origin of the ophthalmic artery. The left middle cerebral artery demonstrates patency of the left M1 segment. The superior division demonstrates a M2 branch occlusion. Additionally noted is occlusion of the left middle cerebral artery inferior division in the distal M3 region. The left anterior cerebral artery opacifies into the capillary and venous phases. PROCEDURE: The JB 1 catheter in the left common carotid artery was exchanged over a 0.035 inch 300 cm Rosen exchange guidewire for a [employer] 8 French 95 cm catheter which was positioned in the proximal 1/3 of the left internal carotid artery. The guidewire was removed. Good aspiration obtained from the hub of the TracStar guide catheter. A gentle control arteriogram performed through this demonstrated no evidence of spasms, dissections or of intraluminal filling defects. Over a 0.014 inch standard Synchro micro guidewire with a J configuration, a 132 cm 55 Zoom aspiration catheter with a 35 136 aspiration catheter combination was advanced to the supraclinoid left ICA. The micro guidewire was then gently advanced into the left middle cerebral artery occluded superior division in the M2 segment. The guidewire was removed after advancement of the 35 aspiration catheter in the distal M2 M3 region. The guidewire was removed. Good aspiration obtained from the hub of the 35 Zoom aspiration catheter. Thereafter, with aspiration being applied at the hub of the 55 Zoom aspiration  catheter, the TracStar aspiration catheter using a 60 mL syringe, and a 20 mL syringe  at the hub of the 35 Zoom aspiration catheter for approximately 1-1/2 minutes, the combination of the 35 and 55 were retrieved and removed. Clots were seen within the aspirate, and also in the 35 Zoom aspiration catheter. Control arteriogram performed through the TracStar guide catheter in the distal left internal carotid artery demonstrated no significant change in the occluded superior division branch M2 segment. A second pass was then made in a similar fashion with 55 Zoom aspiration catheter, and the 35 Zoom aspiration catheter advanced into the distal M2 M3 segment of the superior division. The 55 aspiration catheter was engaged in the proximal occluded vessel. Aspiration applied for approximately 2 minutes at the 55 Zoom aspiration catheter with a Penumbra aspiration device, with a 20 mL syringe at the hub of the 35 Zoom aspiration catheter, and with a 20 mL syringe at the hub of the TracStar catheter. Combination was then retrieved and removed. Copious amounts of clot were seen within the canister. Also seen were bits of clot in the 55 aspiration catheter. A control arteriogram performed through the TracStar catheter in the left internal carotid artery now demonstrated complete revascularization of the superior division M2 segment. The M3 segment of the inferior division remained occluded. The 40 Zoom aspiration catheter was then advanced with an 021 Trevo ProVue microcatheter combination over a 0.014 inch micro guidewire with a J-tip configuration to the left middle cerebral artery distally. The micro guidewire was then gently manipulated and advanced through the inferior division into the M3 region followed by the microcatheter. The microcatheter could not be advanced on account of severe tortuosity of the distal M3 occluded segment. However, the micro guidewire was advanced in a to and fro fashion through the occlusion  multiple times in order to establish some distal flow. The micro guidewire was then retrieved and removed. A control arteriogram performed through the microcatheter in the inferior division now demonstrated slow advancement of contrast distal to this. The microcatheter and micro guidewire were retrieved and removed. A control arteriogram performed through the 55 aspiration catheter in the cavernous left ICA demonstrated a TICI 2C revascularization of the left MCA distribution. The left posterior communicating artery remained patent. Slow recanalization was evident in the M3 region of the inferior division. However, also noted now was a filling defect in the anterior cerebral artery A2 A3 junction with slow flow distal to this. Proximal stenosis probably related to intracranial arteriosclerosis was evident. It was, therefore, decided not to attempt at aspiration at this site as slow recanalization was evident. A final control arteriogram performed through the TracStar guide sheath in the left common carotid artery demonstrated wide patency of the left internal carotid artery proximally and distally. The left middle cerebral artery continued to demonstrate a TICI 2c revascularization with mild to modestly improved flow through the distal inferior division M3 segment. Also noted was improved flow through the left anterior cerebral artery in the A2 A3 junction. A flat panel CT of the brain demonstrated no evidence of intracranial hemorrhage, mass effect or midline shift. No gross hypodensities were evident. The right groin 8 French sheath was removed with manual compression applied for 30 minutes. Hemostasis was achieved. Distal pulses remained Dopplerable and unchanged in both feet. The patient was extubated without event. Upon recovery, the patient was able to move her left arm and leg spontaneously and also lift the right leg spontaneously. There continued be weakness of the right upper extremity. Also the patient  demonstrated difficulty comprehending simple instructions. She was  then transferred to the neuro ICU for post revascularization management. IMPRESSION: Status post endovascular revascularization of M2 superior division branch of the left middle cerebral artery, and partially of the left middle cerebral inferior division in the distal M3 region achieving a TICI 2c revascularization. Incidental note made of an approximately 3.5 mm wide neck saccular outpouching from the posterior wall of the left internal carotid artery at the level origin of the ophthalmic artery. PLAN: Follow-up in the clinic approximately 4 to 6 weeks post discharge. Electronically Signed   By: Luanne Bras M.D.   On: 05/04/2020 15:48   CT CEREBRAL PERFUSION W CONTRAST  Result Date: 05/03/2020 CLINICAL DATA:  Right-sided weakness, M2 MCA occlusion EXAM: CT PERFUSION BRAIN TECHNIQUE: Multiphase CT imaging of the brain was performed following IV bolus contrast injection. Subsequent parametric perfusion maps were calculated using RAPID software. CONTRAST:  70mL OMNIPAQUE IOHEXOL 350 MG/ML SOLN COMPARISON:  None. FINDINGS: CT Brain Perfusion Findings: CBF (<30%) Volume: 16mL Perfusion (Tmax>6.0s) volume: 82mL Mismatch Volume: 81mL ASPECTS on noncontrast CT Head: 10 at 1:02 p.m. today. Infarct Core: 0 mL Infarction Location:Territory at risk in the posterior left MCA territory IMPRESSION: No core infarction identified. 67 mL of penumbra in the posterior left MCA territory. Electronically Signed   By: Macy Mis M.D.   On: 05/03/2020 14:36   DG CHEST PORT 1 VIEW  Result Date: 05/12/2020 CLINICAL DATA:  Leukocytosis EXAM: PORTABLE CHEST 1 VIEW COMPARISON:  05/05/2020 FINDINGS: Single frontal view of the chest demonstrates stable enlarged cardiac silhouette. Persistent central vascular congestion and small bilateral pleural effusions. Patchy areas of consolidation at the lung bases are unchanged. Slight improvement in the diffuse  interstitial prominence and patchy ground-glass airspace disease seen previously. IMPRESSION: 1. Persistent but improving congestive heart failure. Superimposed infection cannot be completely excluded. Electronically Signed   By: Randa Ngo M.D.   On: 05/12/2020 21:15   DG CHEST PORT 1 VIEW  Result Date: 05/05/2020 CLINICAL DATA:  Hypoxia EXAM: PORTABLE CHEST 1 VIEW COMPARISON:  05/03/2020 FINDINGS: Cardiomegaly and vascular pedicle widening. Diffuse hazy and interstitial opacity with small pleural effusions. Negative for pneumothorax. IMPRESSION: CHF pattern with mild improvement 2 days ago. Electronically Signed   By: Monte Fantasia M.D.   On: 05/05/2020 04:02   DG CHEST PORT 1 VIEW  Result Date: 05/03/2020 CLINICAL DATA:  Hypoxia, recent cerebral revascularization. EXAM: PORTABLE CHEST 1 VIEW COMPARISON:  02/07/2020 FINDINGS: Cardiac shadow is enlarged but stable. Diffuse vascular congestion is noted with some patchy edema particularly in the right lung. Small effusions are noted bilaterally. No bony abnormality is seen. IMPRESSION: Vascular congestion with mild parenchymal edema on the right consistent with congestive failure. Electronically Signed   By: Inez Catalina M.D.   On: 05/03/2020 19:25   ECHOCARDIOGRAM COMPLETE  Result Date: 05/04/2020    ECHOCARDIOGRAM REPORT   Patient Name:   RAPHAELA CANNADAY Date of Exam: 05/04/2020 Medical Rec #:  [id number]       Height:       64.5 in Accession #:    [id number]      Weight:       175.0 lb Date of Birth:  Dec 01, 1940        BSA:          1.859 m Patient Age:    79 years        BP:           121/79 mmHg Patient Gender: F  HR:           96 bpm. Exam Location:  Inpatient Procedure: 2D Echo, Cardiac Doppler, Color Doppler and Intracardiac            Opacification Agent Indications:    CVA  History:        Patient has no prior history of Echocardiogram examinations.                 Stroke, Arrythmias:Atrial Fibrillation; Risk                  Factors:Hypertension, Diabetes, Dyslipidemia and Obesity.  Sonographer:    Dustin Flock Referring Phys: [id number] [place]  1. Left ventricular ejection fraction, by estimation, is 55 to 60%. The left ventricle has normal function. The left ventricle has no regional wall motion abnormalities. There is moderate concentric left ventricular hypertrophy. Left ventricular diastolic function could not be evaluated.  2. Right ventricular systolic function is mildly reduced. The right ventricular size is moderately enlarged. There is moderately elevated pulmonary artery systolic pressure. The estimated right ventricular systolic pressure is 76.1 mmHg.  3. Left atrial size was moderately dilated.  4. Right atrial size was severely dilated.  5. A small pericardial effusion is present. The pericardial effusion is posterior to the left ventricle. Large pleural effusion in the left lateral region.  6. The mitral valve is normal in structure. Moderate mitral valve regurgitation. No evidence of mitral stenosis.  7. Tricuspid valve regurgitation is moderate to severe.  8. The aortic valve is normal in structure. Aortic valve regurgitation is trivial. No aortic stenosis is present.  9. The inferior vena cava is normal in size with greater than 50% respiratory variability, suggesting right atrial pressure of 3 mmHg. Conclusion(s)/Recommendation(s): No intracardiac source of embolism detected on this transthoracic study. A transesophageal echocardiogram is recommended to exclude cardiac source of embolism if clinically indicated. FINDINGS  Left Ventricle: Left ventricular ejection fraction, by estimation, is 55 to 60%. The left ventricle has normal function. The left ventricle has no regional wall motion abnormalities. Definity contrast agent was given IV to delineate the left ventricular  endocardial borders. The left ventricular internal cavity size was normal in size. There is moderate concentric left  ventricular hypertrophy. Left ventricular diastolic function could not be evaluated due to atrial fibrillation. Left ventricular diastolic function could not be evaluated. Normal left ventricular filling pressure. Right Ventricle: The right ventricular size is moderately enlarged. No increase in right ventricular wall thickness. Right ventricular systolic function is mildly reduced. There is moderately elevated pulmonary artery systolic pressure. The tricuspid regurgitant velocity is 2.86 m/s, and with an assumed right atrial pressure of 15 mmHg, the estimated right ventricular systolic pressure is 95.0 mmHg. Left Atrium: Left atrial size was moderately dilated. Right Atrium: Right atrial size was severely dilated. Pericardium: A small pericardial effusion is present. The pericardial effusion is posterior to the left ventricle. Mitral Valve: The mitral valve is normal in structure. Moderate mitral valve regurgitation. No evidence of mitral valve stenosis. Tricuspid Valve: The tricuspid valve is normal in structure. Tricuspid valve regurgitation is moderate to severe. No evidence of tricuspid stenosis. Aortic Valve: The aortic valve is normal in structure. Aortic valve regurgitation is trivial. No aortic stenosis is present. Pulmonic Valve: The pulmonic valve was normal in structure. Pulmonic valve regurgitation is not visualized. No evidence of pulmonic stenosis. Aorta: The aortic root is normal in size and structure. Venous: The inferior vena cava is normal in size  with greater than 50% respiratory variability, suggesting right atrial pressure of 3 mmHg. IAS/Shunts: No atrial level shunt detected by color flow Doppler. Additional Comments: No apical thrombus on Definity echocontrast images. There is a large pleural effusion in the left lateral region.  LEFT VENTRICLE PLAX 2D LVIDd:         4.10 cm  Diastology LVIDs:         2.80 cm  LV e' medial:    5.44 cm/s LV PW:         1.30 cm  LV E/e' medial:  17.2 LV IVS:         1.20 cm  LV e' lateral:   7.94 cm/s LVOT diam:     2.00 cm  LV E/e' lateral: 11.8 LV SV:         33 LV SV Index:   18 LVOT Area:     3.14 cm  RIGHT VENTRICLE RV Basal diam:  3.30 cm RV S prime:     6.96 cm/s TAPSE (M-mode): 2.9 cm LEFT ATRIUM             Index       RIGHT ATRIUM           Index LA diam:        4.60 cm 2.48 cm/m  RA Area:     18.00 cm LA Vol (A2C):   69.7 ml 37.50 ml/m RA Volume:   47.80 ml  25.72 ml/m LA Vol (A4C):   75.7 ml 40.73 ml/m LA Biplane Vol: 74.7 ml 40.19 ml/m  AORTIC VALVE LVOT Vmax:   71.10 cm/s LVOT Vmean:  44.800 cm/s LVOT VTI:    0.105 m  AORTA Ao Root diam: 2.70 cm MITRAL VALVE               TRICUSPID VALVE MV Area (PHT): 3.77 cm    TR Peak grad:   32.7 mmHg MV Decel Time: 201 msec    TR Vmax:        286.00 cm/s MV E velocity: 93.60 cm/s                            SHUNTS                            Systemic VTI:  0.10 m                            Systemic Diam: 2.00 cm Ena Dawley MD Electronically signed by Ena Dawley MD Signature Date/Time: 05/04/2020/11:16:53 AM    Final    IR PERCUTANEOUS ART THROMBECTOMY/INFUSION INTRACRANIAL INC DIAG ANGIO  Result Date: 05/05/2020 INDICATION: New onset of aphasia, slurred speech, right facial weakness, and right leg weakness. 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 EXAM: 1. EMERGENT LARGE VESSEL OCCLUSION THROMBOLYSIS (anterior CIRCULATION) COMPARISON:  CT angiogram of the head and neck May 03, 2020. MEDICATIONS: Ancef 2 g IV antibiotic was administered within 1 hour of the procedure. ANESTHESIA/SEDATION: General anesthesia CONTRAST:  Isovue 300 approximately 80 mL FLUOROSCOPY TIME:  Fluoroscopy Time: 38 minutes 30 seconds (1809 mGy). COMPLICATIONS: None immediate. TECHNIQUE: Following a full explanation of the procedure along with the potential associated complications, an informed witnessed consent was obtained. The risks of  intracranial hemorrhage of 10%,  worsening neurological deficit, ventilator dependency, death and inability to revascularize were all reviewed in detail with the patient's . The patient was then put under general anesthesia by the Department of Anesthesiology at [facility]. The right groin was prepped and draped in the usual sterile fashion. Thereafter using modified Seldinger technique, transfemoral access into the right common femoral artery was obtained without difficulty. Over a 0.035 inch guidewire an 8 French 25 cm Pinnacle sheath was inserted. Through this, and also over a 0.035 inch guidewire a 5 [place] JB 1 catheter was advanced to the aortic arch region and selectively positioned in the left common carotid artery. An arteriogram was then performed centered extra cranially and intracranially. FINDINGS: The left common carotid arteriogram demonstrates the left external carotid artery and its major branches to be widely patent. The left internal carotid artery at the bulb to the cranial skull base also demonstrates wide patency. The petrous, the cavernous and the supraclinoid segments are widely patent. A small infundibulum is seen at the origin of the left posterior communicating artery. Also observed is the broad-based saccular outpouching along the posterior wall of the left internal carotid artery opposite the origin of the ophthalmic artery. The left middle cerebral artery demonstrates patency of the left M1 segment. The superior division demonstrates a M2 branch occlusion. Additionally noted is occlusion of the left middle cerebral artery inferior division in the distal M3 region. The left anterior cerebral artery opacifies into the capillary and venous phases. PROCEDURE: The JB 1 catheter in the left common carotid artery was exchanged over a 0.035 inch 300 cm Rosen exchange guidewire for a [employer] 8 French 95 cm catheter which was positioned in the proximal 1/3 of the left internal  carotid artery. The guidewire was removed. Good aspiration obtained from the hub of the TracStar guide catheter. A gentle control arteriogram performed through this demonstrated no evidence of spasms, dissections or of intraluminal filling defects. Over a 0.014 inch standard Synchro micro guidewire with a J configuration, a 132 cm 55 Zoom aspiration catheter with a 35 136 aspiration catheter combination was advanced to the supraclinoid left ICA. The micro guidewire was then gently advanced into the left middle cerebral artery occluded superior division in the M2 segment. The guidewire was removed after advancement of the 35 aspiration catheter in the distal M2 M3 region. The guidewire was removed. Good aspiration obtained from the hub of the 35 Zoom aspiration catheter. Thereafter, with aspiration being applied at the hub of the 55 Zoom aspiration catheter, the TracStar aspiration catheter using a 60 mL syringe, and a 20 mL syringe at the hub of the 35 Zoom aspiration catheter for approximately 1-1/2 minutes, the combination of the 35 and 55 were retrieved and removed. Clots were seen within the aspirate, and also in the 35 Zoom aspiration catheter. Control arteriogram performed through the TracStar guide catheter in the distal left internal carotid artery demonstrated no significant change in the occluded superior division branch M2 segment. A second pass was then made in a similar fashion with 55 Zoom aspiration catheter, and the 35 Zoom aspiration catheter advanced into the distal M2 M3 segment of the superior division. The 55 aspiration catheter was engaged in the proximal occluded vessel. Aspiration applied for approximately 2 minutes at the 55 Zoom aspiration catheter with a Penumbra aspiration device, with a 20 mL syringe at the hub of the 35 Zoom aspiration catheter, and with a 20 mL syringe at the hub of the TracStar catheter. Combination  was then retrieved and removed. Copious amounts of clot were seen  within the canister. Also seen were bits of clot in the 55 aspiration catheter. A control arteriogram performed through the TracStar catheter in the left internal carotid artery now demonstrated complete revascularization of the superior division M2 segment. The M3 segment of the inferior division remained occluded. The 61 Zoom aspiration catheter was then advanced with an 021 Trevo ProVue microcatheter combination over a 0.014 inch micro guidewire with a J-tip configuration to the left middle cerebral artery distally. The micro guidewire was then gently manipulated and advanced through the inferior division into the M3 region followed by the microcatheter. The microcatheter could not be advanced on account of severe tortuosity of the distal M3 occluded segment. However, the micro guidewire was advanced in a to and fro fashion through the occlusion multiple times in order to establish some distal flow. The micro guidewire was then retrieved and removed. A control arteriogram performed through the microcatheter in the inferior division now demonstrated slow advancement of contrast distal to this. The microcatheter and micro guidewire were retrieved and removed. A control arteriogram performed through the 55 aspiration catheter in the cavernous left ICA demonstrated a TICI 2C revascularization of the left MCA distribution. The left posterior communicating artery remained patent. Slow recanalization was evident in the M3 region of the inferior division. However, also noted now was a filling defect in the anterior cerebral artery A2 A3 junction with slow flow distal to this. Proximal stenosis probably related to intracranial arteriosclerosis was evident. It was, therefore, decided not to attempt at aspiration at this site as slow recanalization was evident. A final control arteriogram performed through the TracStar guide sheath in the left common carotid artery demonstrated wide patency of the left internal carotid  artery proximally and distally. The left middle cerebral artery continued to demonstrate a TICI 2c revascularization with mild to modestly improved flow through the distal inferior division M3 segment. Also noted was improved flow through the left anterior cerebral artery in the A2 A3 junction. A flat panel CT of the brain demonstrated no evidence of intracranial hemorrhage, mass effect or midline shift. No gross hypodensities were evident. The right groin 8 French sheath was removed with manual compression applied for 30 minutes. Hemostasis was achieved. Distal pulses remained Dopplerable and unchanged in both feet. The patient was extubated without event. Upon recovery, the patient was able to move her left arm and leg spontaneously and also lift the right leg spontaneously. There continued be weakness of the right upper extremity. Also the patient demonstrated difficulty comprehending simple instructions. She was then transferred to the neuro ICU for post revascularization management. IMPRESSION: Status post endovascular revascularization of M2 superior division branch of the left middle cerebral artery, and partially of the left middle cerebral inferior division in the distal M3 region achieving a TICI 2c revascularization. Incidental note made of an approximately 3.5 mm wide neck saccular outpouching from the posterior wall of the left internal carotid artery at the level origin of the ophthalmic artery. PLAN: Follow-up in the clinic approximately 4 to 6 weeks post discharge. Electronically Signed   By: Luanne Bras M.D.   On: 05/04/2020 15:48   CT HEAD CODE STROKE WO CONTRAST  Addendum Date: 05/03/2020   ADDENDUM REPORT: 05/03/2020 14:38 ADDENDUM: Exam and technique should not include CT perfusion, which was performed subsequently and dictated separately. Only CT head without contrast and CT angiography of the head and neck with contrast performed. Electronically Signed  By: Macy Mis M.D.    On: 05/03/2020 14:38   Result Date: 05/03/2020 CLINICAL DATA:  Code stroke.  Right-sided weakness EXAM: CT HEAD WITHOUT CONTRAST CT ANGIOGRAPHY HEAD AND NECK CT PERFUSION BRAIN TECHNIQUE: Contiguous axial images were obtained the skull base to the vertex without contrast. Multidetector CT imaging of the head and neck was performed using the standard protocol during bolus administration of intravenous contrast. Multiplanar CT image reconstructions and MIPs were obtained to evaluate the vascular anatomy. Carotid stenosis measurements (when applicable) are obtained utilizing NASCET criteria, using the distal internal carotid diameter as the denominator. Multiphase CT imaging of the brain was performed following IV bolus contrast injection. Subsequent parametric perfusion maps were calculated using RAPID software. CONTRAST:  46mL OMNIPAQUE IOHEXOL 350 MG/ML SOLN COMPARISON:  None. FINDINGS: CT HEAD Brain: There is no acute intracranial hemorrhage, mass effect, or edema. Gray-white differentiation is preserved. Patchy and confluent areas hypoattenuation in the supratentorial white but may reflect moderate chronic microvascular ischemic changes. There is no extra-axial fluid collection. Prominence of the ventricles and sulci reflects generalized parenchymal volume loss. Vascular: No hyperdense vessel. Skull: Calvarium is unremarkable. Sinuses/Orbits: No acute finding. Other: None. ASPECTS ([place] Stroke Program Early CT Score) - Ganglionic level infarction (caudate, lentiform nuclei, internal capsule, insula, M1-M3 cortex): 7 - Supraganglionic infarction (M4-M6 cortex): 3 Total score (0-10 with 10 being normal): 10 CTA NECK Aortic arch: Great vessel origins are patent. Right carotid system: Patent. Mild calcified plaque at the ICA origin causing minimal stenosis. Left carotid system: Patent. Minimal calcified plaque at the proximal ICA without measurable stenosis. Vertebral arteries: Patent.  Right vertebral artery is  dominant. Skeleton: Degenerative changes of the cervical spine primarily C6-C7. Other neck: Heterogeneous, multinodular thyroid. Further ultrasound follow-up is not recommended by current guidelines. Upper chest: Partially imaged small bilateral pleural effusions. Peribronchial and interstitial thickening likely reflecting interstitial edema. Review of the MIP images confirms the above findings CTA HEAD Anterior circulation: Intracranial internal carotid arteries are patent with mild calcified plaque. Left M1 MCA is patent. There is occlusion of a M2 MCA branch approximately 13 mm from the MCA trifurcation (see saved key image series 11, 27). There is partial reconstitution followed by occlusion and subsequent distal M3 reconstitution. Right middle and both anterior cerebral arteries are patent. Posterior circulation: Intracranial vertebral arteries are patent with minimal calcified plaque on the right. Basilar artery is patent. Posterior cerebral arteries are patent. There are bilateral posterior communicating arteries present. Venous sinuses: Patent as allowed by contrast bolus timing. Review of the MIP images confirms the above findings IMPRESSION: No acute intracranial hemorrhage. No acute infarction identified. ASPECT score is 10. 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 No hemodynamically significant stenosis the neck. Partially imaged mild interstitial pulmonary edema and small bilateral pleural effusions. Results were called by telephone at the time of interpretation on 05/03/2020 at 1:09 pm and again at 1:17 p.m. to provider [facility] , who verbally acknowledged these results. Electronically Signed: By: Macy Mis M.D. On: 05/03/2020 13:32   VAS [place] LOWER EXTREMITY VENOUS (DVT)  Result Date: 05/14/2020  Lower Venous DVT Study Indications: Swelling.  Risk Factors: None identified. Limitations: Body habitus, poor ultrasound/tissue interface and patient positioning,  patient immobility. Comparison Study: No prior studies. Performing Technologist: Oliver Hum RVT  Examination Guidelines: A complete evaluation includes B-mode imaging, spectral Doppler, color Doppler, and power Doppler as needed of all accessible portions of each vessel. Bilateral testing is considered an integral part  of a complete examination. Limited examinations for reoccurring indications may be performed as noted. The reflux portion of the exam is performed with the patient in reverse Trendelenburg.  +---------+---------------+---------+-----------+----------+--------------+ RIGHT    CompressibilityPhasicitySpontaneityPropertiesThrombus Aging +---------+---------------+---------+-----------+----------+--------------+ CFV      Full           Yes      Yes                                 +---------+---------------+---------+-----------+----------+--------------+ SFJ      Full                                                        +---------+---------------+---------+-----------+----------+--------------+ FV Prox  Full                                                        +---------+---------------+---------+-----------+----------+--------------+ FV Mid   Full                                                        +---------+---------------+---------+-----------+----------+--------------+ FV DistalFull                                                        +---------+---------------+---------+-----------+----------+--------------+ PFV      Full                                                        +---------+---------------+---------+-----------+----------+--------------+ POP      Full           Yes      Yes                                 +---------+---------------+---------+-----------+----------+--------------+ PTV      Full                                                         +---------+---------------+---------+-----------+----------+--------------+ PERO     Full                                                        +---------+---------------+---------+-----------+----------+--------------+   +---------+---------------+---------+-----------+----------+-------------------+ LEFT     CompressibilityPhasicitySpontaneityPropertiesThrombus Aging      +---------+---------------+---------+-----------+----------+-------------------+ CFV  Full           Yes      Yes                                      +---------+---------------+---------+-----------+----------+-------------------+ SFJ      Full                                                             +---------+---------------+---------+-----------+----------+-------------------+ FV Prox  Full                                                             +---------+---------------+---------+-----------+----------+-------------------+ FV Mid   Full                                                             +---------+---------------+---------+-----------+----------+-------------------+ FV Distal                                             Not well visualized +---------+---------------+---------+-----------+----------+-------------------+ PFV      Full                                                             +---------+---------------+---------+-----------+----------+-------------------+ POP      Full           Yes      Yes                                      +---------+---------------+---------+-----------+----------+-------------------+ PTV      Full                                                             +---------+---------------+---------+-----------+----------+-------------------+ PERO                                                  Not well visualized +---------+---------------+---------+-----------+----------+-------------------+     Summary:  RIGHT: - No evidence of common femoral vein obstruction.  LEFT: - There is no evidence of deep vein thrombosis in the lower extremity. However, portions of this examination were limited- see technologist comments above.  -  No cystic structure found in the popliteal fossa.  *See table(s) above for measurements and observations.    Preliminary    VAS [place] UPPER EXTREMITY VENOUS DUPLEX  Result Date: 05/06/2020 UPPER VENOUS STUDY  Indications: Edema, and IV infiltration Limitations: Edema. Comparison Study: No prior study Performing Technologist: Sharion Dove RVS  Examination Guidelines: A complete evaluation includes B-mode imaging, spectral Doppler, color Doppler, and power Doppler as needed of all accessible portions of each vessel. Bilateral testing is considered an integral part of a complete examination. Limited examinations for reoccurring indications may be performed as noted.  Right Findings: +----------+------------+---------+-----------+----------+-------+ RIGHT     CompressiblePhasicitySpontaneousPropertiesSummary +----------+------------+---------+-----------+----------+-------+ IJV           Full                                          +----------+------------+---------+-----------+----------+-------+ Subclavian    Full       Yes       Yes                      +----------+------------+---------+-----------+----------+-------+ Axillary      Full       Yes       Yes                      +----------+------------+---------+-----------+----------+-------+ Brachial      Full       Yes       Yes                      +----------+------------+---------+-----------+----------+-------+ Cephalic      None                                   Acute  +----------+------------+---------+-----------+----------+-------+ Basilic       Full                                          +----------+------------+---------+-----------+----------+-------+  Left Findings:  +----------+------------+---------+-----------+----------+-------+ LEFT      CompressiblePhasicitySpontaneousPropertiesSummary +----------+------------+---------+-----------+----------+-------+ Subclavian    Full       Yes       Yes                      +----------+------------+---------+-----------+----------+-------+  Summary:  Right: No evidence of deep vein thrombosis in the upper extremity. Findings consistent with acute superficial vein thrombosis involving the right cephalic vein.  Left: No evidence of thrombosis in the subclavian.  *See table(s) above for measurements and observations.  Diagnosing physician: Servando Snare MD Electronically signed by Servando Snare MD on 05/06/2020 at 3:30:01 PM.    Final       HISTORY OF PRESENT ILLNESS  (From Dr Lyn Records H&P 05/03/2020) Jennifer L Goodpasture is a 79 y.o. 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 Please see excellent note by Dr. Rory Percy for further details of her presentation. In brief, she went to urgent care for evaluation of palpitations and was found to be confused after getting  clonidine and labetalol to treat her A. fib with RVR.  Her initial code stroke evaluation was with NIH of 6 for mild right leg drift, right facial droop, not being able to answer orientation questions and mild aphasia as well as mild dysarthria.  CTA CTP demonstrated a left M3 cutoff point with 66 mL of tissue at risk.  The risk benefit ratio was discussed with the family given the disabling effects of aphasia and the relatively large area at risk decision was made to proceed with thrombectomy.   Post thrombectomy paralytic was reversed and patient was extubated.  She was noted to have much more marked  right-sided weakness and aphasia, with a new left-sided gaze preference compared to prior to intervention, although post intervention scan was negative for any significant hemorrhage.  This was initially attributed to postanesthesia effect, but was persistent.  Her heart rates were noted to be in the 140s with oxygen saturations in the 90s, for which CCM was consulted. LKW: 10 AM on 11/3 tPA given?: No, due to last dose of Xarelto10/3 AM  IA performed?: Yes Premorbid modified rankin scale:      0 - No symptoms.  [facility] Telemedicine Consult - Dr Rory Percy - 05/03/2020 [place] Requesting Provider:  Dr Sedonia Small Consult Participants: Dr. Jerelyn Charles, [place]   Bedside RN Jinny Blossom Location of the provider: [facility] Location of the patient: [facility] This consult was provided via telemedicine with 2-way video and audio communication. The patient/family was informed that care would be provided in this way and agreed to receive care in this manner.  Chief Complaint: confusion and right facial droop HPI: Patient is a 79 year old woman, past medical history of atrial fibrillation on Xarelto last dose this morning, diabetes, hyperlipidemia, hypertension, hypothyroidism, small vessel etiology left thalamic stroke documented on the chart without any residual deficits, last known normal according to this EMS report-at 10 AM when in after she had an acute onset of confusion and some right facial weakness. This morning she was in her normal state of health but then started having trouble with palpitations.  She was taken to urgent care where her heart rate was in the 190s, blood pressure in the 180s, given clonidine followed by labetalol due to her being in rapid A. fib and shortly after was noted that she was not acting right.  EMS was called.  On their evaluation she had a right facial droop and was appearing confused and not making sense in all her sentences. Brought in to [facility] where a  code stroke was activated. I saw and evaluated her on the camera-initial NIH 6 for mild right leg drift, right facial droop, not being able to answer the orientation questions as well as aphasia-able to name simple objects but not able to say full sentences and speech became garbled on long sentences along with mild dysarthria. Son had only seen her 3 days, but spoken over the phone a day or so ago and could not tell me when the last known normal today was.  He gave me a number for a friend/neighbor [phone] which was unreachable after multiple tries.  She was the person who had witnessed this morning's events and called EMS.  EMS reported last known well was [place]ia Ms. YASAMAN KOLEK is a 79 y.o. female with history of atrial fibrillation (Xarelto last taken this morning), diabetes, hyperlipidemia, hypertension, hypothyroidism (not currently on any medications), prior left thalamic stroke without residual  deficits, who had sudden onset confusion and right facial droop 10AM. She did not receive IV t-PA due to anticoagulation with Xarelto. IR - Cerebral Angiogram with Intervention - 05/03/20 - revascularization of occluded sup division M2 branch - Dr Estanislado Pandy  Stroke: Lt MCA scattered infarcts due to left M2 occlusion s/p [id number] - embolic - likely from Afib even on Xarelto.  CT Head - No acute intracranial hemorrhage. No acute infarction identified. ASPECT score is 10.      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 No hemodynamically significant stenosis the neck.   CT Perfusion - No core infarction identified. 67 mL of penumbra in the posterior left MCA territory.  MRI head x 2 - Patchy acute ischemic infarcts involving the MCA and ACA distributions, predominantly cortical in nature.    MRA head x 2 -  Previously identified occluded left M2 branch appears grossly patent status post revascularization.   EEG - left cortical  dysfunction, no seizure  2D Echo - EF 55-60%  [employer] Virus 2 - negative  LDL - 124  HgbA1c - 6.4  VTE prophylaxis - [place] Heparin  Xarelto (rivaroxaban) daily prior to admission, now on Eliquis 5 mg Bid due to Xarelto failure.  Therapy recommendations:  CIR   Disposition:  Discharge to CIR on 05/15/2020  Afib RVR  On Xarelto PTA  Now on Eliquis  Rate difficult to control   Cardiology consulted 05/05/2020 - was on amio gtt now off  Now on metoprolol and Cardizem 90 qid->60 qid->60 tid   Change metoprolol to 100XL per cardiology recommendations  Rate controlled  Elevated troponin  Trop 68->63->73  Cardiology on board  Considering related to Afib instead of ischemia  Hx of hypertension Hypotension  Home BP meds: Toprol ; Zestril  Current BP meds: Toprol XL 100 mg daily and diltiazem 60 Q 8 hours  Blood pressure stable   Long-term BP goal normotensive  Hyperlipidemia  Home Lipid lowering medication: none   LDL 124, goal < 70  Put on Zetia 10 mg daily  Hx of statin intolerance (Lipitor ; Pravastatin ; Crestor)  Diabetes  Home diabetic meds: metformin  Current diabetic meds: SSI   HgbA1c 6.4, goal < 7.0  CBG monitoring  PCP follow up  AKI, likely post renal  Creatinine 0.82->1.92->2.07-> 1.14  Consulted hospitalist Dr. Darrick Meigs  Off IVF  Large urinary retention - Foley catheter placed 05/14/20  Renal US no hydrohephrosis  Leukocytosis   WBC's -  13.4->15.6->19.2->16.2->11.1->8.7->13.1->19.4->24.1->16.0 (afebrile)  Dr. 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  Ophthalmologic - fundi not visualized due to noncooperation.  Cardiovascular - irregularly irregular heart rate and  rhythm.  Neuro - awake, alert, expressive aphasia but no dysarthria, able to follow some simple verbal commands, but still has slow response and perseveration, can repeat 3 words sentence, not able to name. Able to gaze bilaterally. Blinking to visual threat bilaterally. PERRL, no significant facial droop. Tongue protrusion midline. LUE strong and active, no drift. LLE 3/5. RUE 3+/5 with drift but not hitting bed in 10 sec. RLE mild withdraw to pain and can wiggle toes. Sensation subjectively symmetrical (but has aphasia), coordination not cooperative and gait not tested.    Discharge Diet   Diet Order            Diet regular Room service appropriate? No; Fluid consistency: Thin  Diet effective now                liquids  DISCHARGE PLAN  Disposition:  Discharge to [place] for ongoing PT, OT and ST  Eliquis (apixaban) daily for secondary stroke prevention.  Recommend ongoing risk factor control by Primary Care Physician at time of discharge from inpatient rehabilitation.  Follow-up Sharion Balloon, FNP in 2 weeks following discharge from rehab.  Follow-up in [facility] in 4 weeks following discharge from rehab, office to schedule an appointment.   35 minutes were spent preparing discharge.  Rosalin Hawking, MD PhD Stroke Neurology 05/15/2020 4:51 PM

## 2020-05-15 NOTE — Progress Notes (Signed)
EKG completed did show atrial fibrillation.  Patient with known history of atrial fibrillation.  Discussed with cardiology services rate currently controlled.  Toprol has been increased to 100 mg daily and she will continue on Cardizem 60 mg every 8 hours as well as Eliquis as advised.

## 2020-05-15 NOTE — Progress Notes (Signed)
Patient arrived to the unit. Expressive aphasia but able to make needs understood. Explained unit routines to patient. Agreed to wait for help before trying to get out of bed but may need reinforcement.

## 2020-05-15 NOTE — Progress Notes (Signed)
PHARMACY NOTE:  ANTIMICROBIAL RENAL DOSAGE ADJUSTMENT  Current antimicrobial regimen includes a mismatch between antimicrobial dosage and estimated renal function.  As per policy approved by the Pharmacy & Therapeutics and Medical Executive Committees, the antimicrobial dosage will be adjusted accordingly.  Current antimicrobial dosage:  Augmentin 500 mg BID  Indication: Cellulitis  Renal Function:  Estimated Creatinine Clearance: 41.3 mL/min (A) (by C-G formula based on SCr of 1.14 mg/dL (H)). []      On intermittent HD, scheduled: []      On CRRT    Antimicrobial dosage has been changed to:  Augmentin 875 mg BID  Additional comments:   Thank you for allowing pharmacy to be a part of this patient's care.  Dimple Nanas, PharmD PGY-1 Acute Care Pharmacy Resident Office: [phone] 05/15/2020 8:26 AM

## 2020-05-16 ENCOUNTER — Inpatient Hospital Stay (HOSPITAL_COMMUNITY): Payer: Medicare Other | Admitting: Physical Therapy

## 2020-05-16 ENCOUNTER — Inpatient Hospital Stay: Payer: Medicare Other

## 2020-05-16 ENCOUNTER — Inpatient Hospital Stay (HOSPITAL_COMMUNITY): Payer: Medicare Other | Admitting: Speech Pathology

## 2020-05-16 ENCOUNTER — Inpatient Hospital Stay (HOSPITAL_COMMUNITY): Payer: Medicare Other | Admitting: Occupational Therapy

## 2020-05-16 DIAGNOSIS — I63512 Cerebral infarction due to unspecified occlusion or stenosis of left middle cerebral artery: Secondary | ICD-10-CM

## 2020-05-16 DIAGNOSIS — Z23 Encounter for immunization: Secondary | ICD-10-CM

## 2020-05-16 LAB — CBC WITH DIFFERENTIAL/PLATELET
Abs Immature Granulocytes: 0.07 10*3/uL (ref 0.00–0.07)
Basophils Absolute: 0 10*3/uL (ref 0.0–0.1)
Basophils Relative: 0 %
Eosinophils Absolute: 0.2 10*3/uL (ref 0.0–0.5)
Eosinophils Relative: 2 %
HCT: 30.2 % — ABNORMAL LOW (ref 36.0–46.0)
Hemoglobin: 9.1 g/dL — ABNORMAL LOW (ref 12.0–15.0)
Immature Granulocytes: 1 %
Lymphocytes Relative: 9 %
Lymphs Abs: 1.1 10*3/uL (ref 0.7–4.0)
MCH: 23.2 pg — ABNORMAL LOW (ref 26.0–34.0)
MCHC: 30.1 g/dL (ref 30.0–36.0)
MCV: 76.8 fL — ABNORMAL LOW (ref 80.0–100.0)
Monocytes Absolute: 1.4 10*3/uL — ABNORMAL HIGH (ref 0.1–1.0)
Monocytes Relative: 12 %
Neutro Abs: 8.7 10*3/uL — ABNORMAL HIGH (ref 1.7–7.7)
Neutrophils Relative %: 76 %
Platelets: 346 10*3/uL (ref 150–400)
RBC: 3.93 MIL/uL (ref 3.87–5.11)
RDW: 17.1 % — ABNORMAL HIGH (ref 11.5–15.5)
WBC: 11.5 10*3/uL — ABNORMAL HIGH (ref 4.0–10.5)
nRBC: 0 % (ref 0.0–0.2)

## 2020-05-16 LAB — BASIC METABOLIC PANEL
Anion gap: 9 (ref 5–15)
BUN: 9 mg/dL (ref 8–23)
CO2: 20 mmol/L — ABNORMAL LOW (ref 22–32)
Calcium: 8.3 mg/dL — ABNORMAL LOW (ref 8.9–10.3)
Chloride: 109 mmol/L (ref 98–111)
Creatinine, Ser: 0.76 mg/dL (ref 0.44–1.00)
GFR, Estimated: >60 mL/min (ref >60)
Glucose, Bld: 109 mg/dL — ABNORMAL HIGH (ref 70–99)
Potassium: 3 mmol/L — ABNORMAL LOW (ref 3.5–5.1)
Sodium: 138 mmol/L (ref 135–145)

## 2020-05-16 LAB — GLUCOSE, CAPILLARY
Glucose-Capillary: 100 mg/dL — ABNORMAL HIGH (ref 70–99)
Glucose-Capillary: 129 mg/dL — ABNORMAL HIGH (ref 70–99)
Glucose-Capillary: 109 mg/dL — ABNORMAL HIGH (ref 70–99)
Glucose-Capillary: 120 mg/dL — ABNORMAL HIGH (ref 70–99)

## 2020-05-16 MED ORDER — GERHARDT'S BUTT CREAM
TOPICAL_CREAM | Freq: Four times a day (QID) | CUTANEOUS | Status: DC
  Administered 2020-05-20 – 2020-06-06 (×7): 1 via TOPICAL
  Filled 2020-05-16 (×2): qty 1

## 2020-05-16 MED ORDER — POTASSIUM CHLORIDE CRYS ER 20 MEQ PO TBCR
20.0000 meq | EXTENDED_RELEASE_TABLET | Freq: Two times a day (BID) | ORAL | Status: AC
  Administered 2020-05-16 – 2020-05-17 (×4): 20 meq via ORAL
  Filled 2020-05-16 (×4): qty 1

## 2020-05-16 MED ORDER — MELATONIN 3 MG PO TABS
3.0000 mg | ORAL_TABLET | Freq: Every day | ORAL | Status: DC
  Administered 2020-05-16 – 2020-06-05 (×20): 3 mg via ORAL
  Filled 2020-05-16 (×20): qty 1

## 2020-05-16 NOTE — Evaluation (Signed)
Occupational Therapy Assessment and Plan  Patient Details  Name: Sheila Ray MRN: [id number] Date of Birth: [birth year]  OT Diagnosis: abnormal posture, cognitive deficits, hemiplegia affecting dominant side and muscle weakness (generalized) Rehab Potential:   ELOS: 3 weeks   Today's Date: 05/16/2020 OT Individual Time: 3474-2595 OT Individual Time Calculation (min): 70 min     Hospital Problem: Principal Problem:   Left middle cerebral artery stroke [facility])   Past Medical History:  Past Medical History:  Diagnosis Date  . Arthritis   . Atrial fibrillation ([place])   . Complication of anesthesia   . Diabetes mellitus without complication ([place])   . Diverticulosis   . Fatigue   . Gastric polyp   . Goiter   . Hyperlipidemia   . Hypertension   . Hypothyroid    taken off of thyroid medication 2012014   . Obese   . Osteopenia   . PONV (postoperative nausea and vomiting)   . Postmenopausal   . Rosacea   . Stroke ([place]) 01/15/2013   left thalamic  stroke, small vessel  . Vitamin D deficiency    Past Surgical History:  Past Surgical History:  Procedure Laterality Date  . ABDOMINAL HYSTERECTOMY  1985  . FOOT SURGERY Right 08/2002  . IR CT HEAD LTD  05/03/2020  . IR PERCUTANEOUS ART THROMBECTOMY/INFUSION INTRACRANIAL INC DIAG ANGIO  05/03/2020  . RADIOLOGY WITH ANESTHESIA N/A 05/03/2020   Procedure: IR WITH ANESTHESIA;  Surgeon: Radiologist, Medication, MD;  Location: [facility];  Service: Radiology;  Laterality: N/A;  . right knee arthroscopy   2013   . TONSILLECTOMY    . 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   Procedure: RIGHT TOTAL KNEE ARTHROPLASTY;  Surgeon: Gearlean Alf, MD;  Location: [facility];  Service: Orthopedics;  Laterality: Right;    Assessment & Plan Clinical Impression: Patient is a 79 y.o. year old female with history of prior left thalamic infarction without residual weakness, atrial fibrillation maintained on Xarelto, diabetes mellitus, hypertension,  hyperlipidemia and hypothyroidism.  Per chart review lives alone reportedly independent prior to admission and driving.  1 level home 3 steps to entry.  Presented 05/03/2020 with right side weakness facial droop expressive aphasia and altered mental status.  Her heart rate was noted to be in the 140s.  Cranial CT scan showed no acute intracranial hemorrhage no acute infarction.  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  No hemodynamically significant stenosis in the neck.  Patient underwent revascularization of occluded superior division M2 branch per interventional radiology.  MRI of the brain showed cortical restricted diffusion within the left temporal occipital region.  Scattered foci of restricted diffusion also seen in the left frontal parietal and insular regions of the left amygdala.  MRA with no proximal occlusion or stenosis.  EEG negative for seizure.  Echocardiogram with ejection fraction of 55 to 60% no wall motion abnormalities.  Neurology follow-up as well as cardiology services her Xarelto was changed to Eliquis.  She did remain on Cardizem as well as metoprolol for her atrial fibrillation.  Tolerating a regular diet.  Patient with noted leukocytosis 19,400-24,100 05/13/2020 as well as BUN 29 and creatinine 1.92.  Lactic acid within normal limits.  Renal ultrasound without hydronephrosis.  Chest x-ray showed persistent but improving CHF superimposed infection cannot be ruled out.  Urinalysis negative nitrite few bacteria.  Leukocytosis felt likely from underlying left lower extremity cellulitis and placed on Augmentin 05/15/2020 with WBC  improving to 16,000.   Patient transferred to CIR on 05/15/2020 .    Patient currently requires max with basic self-care skills and IADL secondary to muscle weakness, decreased cardiorespiratoy endurance, impaired timing and sequencing, abnormal tone, unbalanced muscle activation, decreased coordination and  decreased motor planning, decreased problem solving, decreased memory and delayed processing and decreased sitting balance, decreased standing balance, decreased postural control, hemiplegia and decreased balance strategies.  Prior to hospitalization, patient could complete ADL, IADL with independent .  Patient will benefit from skilled intervention to decrease level of assist with basic self-care skills and increase independence with basic self-care skills prior to discharge home with care partner.  Anticipate patient will require 24 hour supervision and minimal physical assistance and follow up home health.  OT - End of Session Activity Tolerance: Tolerates 10 - 20 min activity with multiple rests Endurance Deficit: Yes Endurance Deficit Description: fatigue with adl tasks OT Assessment OT Patient demonstrates impairments in the following area(s): Balance;Cognition;Edema;Endurance;Motor;Perception;Safety OT Basic ADL's Functional Problem(s): Eating;Grooming;Bathing;Dressing;Toileting OT Advanced ADL's Functional Problem(s): Simple Meal Preparation;Full Meal Preparation;Laundry;Light Housekeeping OT Transfers Functional Problem(s): Toilet;Tub/Shower OT Additional Impairment(s): Fuctional Use of Upper Extremity OT Plan OT Intensity: Minimum of 1-2 x/day, 45 to 90 minutes OT Frequency: 5 out of 7 days OT Duration/Estimated Length of Stay: 3 weeks OT Treatment/Interventions: Balance/vestibular training;Cognitive remediation/compensation;Discharge planning;DME/adaptive equipment instruction;Functional mobility training;Neuromuscular re-education;Patient/family education;Self Care/advanced ADL retraining;Therapeutic Activities;Therapeutic Exercise;UE/LE Strength taining/ROM;UE/LE Coordination activities OT Self Feeding Anticipated Outcome(s): independent OT Basic Self-Care Anticipated Outcome(s): min a OT Toileting Anticipated Outcome(s): min a OT Bathroom Transfers Anticipated Outcome(s): min  a OT Recommendation Patient destination: Home Follow Up Recommendations: Home health OT;24 hour supervision/assistance Equipment Recommended: To be determined Equipment Details: need clarification on shower set up   OT Evaluation Precautions/Restrictions  Precautions Precautions: Fall Restrictions Weight Bearing Restrictions: No General   Vital Signs Therapy Vitals Temp: 97.9 F (36.6 C) Temp Source: Oral Pulse Rate: 83 Resp: 16 BP: 130/73 Patient Position (if appropriate): Sitting Oxygen Therapy SpO2: 98 % O2 Device: Room Air Pain Pain Assessment Pain Scale: 0-10 Pain Score: 0-No pain Home Living/Prior Functioning Home Living Living Arrangements: Alone Available Help at Discharge: Family, Available PRN/intermittently Type of Home: House Home Access: Stairs to enter Technical brewer of Steps: 3 Entrance Stairs-Rails: None Home Layout: One level Bathroom Shower/Tub: Multimedia programmer: Programmer, systems: Yes  Lives With: Alone Prior Function Level of Independence: Independent with basic ADLs, Independent with homemaking with ambulation, Independent with transfers, Independent with gait Driving: Yes Vision Baseline Vision/History: Wears glasses Wears Glasses: Distance only Patient Visual Report: Blurring of vision Vision Assessment?: Yes Eye Alignment: Within Functional Limits Ocular Range of Motion: Within Functional Limits Alignment/Gaze Preference: Within Defined Limits Tracking/Visual Pursuits: Able to track stimulus in all quads without difficulty Saccades: Other (comment) (difficulty following directions for specific visual activities) Additional Comments: no deficits noted with functional activity and basic assessment - ongoing monitoring/assessment as patient having difficulty with complex directions for indepth assessment Perception  Perception: Within Functional Limits Praxis Praxis: Impaired Praxis Impairment  Details: Motor planning;Initiation Cognition Overall Cognitive Status: Impaired/Different from baseline Arousal/Alertness: Awake/alert Orientation Level: Person Year: Other (Comment) (1921) Month: September Day of Week: Incorrect Memory: Impaired Memory Impairment: Decreased short term memory Decreased Short Term Memory: Functional basic Immediate Memory Recall: Sock;Blue Memory Recall Sock: Not able to recall Memory Recall Blue: Without Cue Memory Recall Bed: Not able to recall Attention: Selective Focused Attention: Impaired Focused Attention Impairment: Verbal basic;Verbal complex Sustained Attention: Impaired Sustained Attention  Impairment: Verbal basic;Verbal complex Selective Attention: Impaired Selective Attention Impairment: Verbal basic;Functional basic Awareness: Impaired Awareness Impairment: Emergent impairment Problem Solving: Impaired Problem Solving Impairment: Verbal basic;Functional basic Executive Function: Sequencing;Organizing;Initiating;Self Correcting Sequencing: Impaired Sequencing Impairment: Functional basic Organizing: Impaired Organizing Impairment: Functional basic;Verbal basic Initiating: Impaired Initiating Impairment: Functional basic Self Correcting: Impaired Self Correcting Impairment: Verbal basic;Functional basic Behaviors: Perseveration Safety/Judgment: Impaired Sensation Sensation Additional Comments: ongoing assessment needed, able to indicate temperature preference in shower Coordination Gross Motor Movements are Fluid and Coordinated: No Fine Motor Movements are Fluid and Coordinated: No Finger Nose Finger Test: right with significant dysmetria, left grossly WFL Motor  Motor Motor: Hemiplegia  Trunk/Postural Assessment    kyphotic posture, forward head Balance Static Sitting Balance Static Sitting - Level of Assistance: 4: Min assist Dynamic Sitting Balance Dynamic Sitting - Level of Assistance: 2: Max  assist Extremity/Trunk Assessment RUE Assessment Passive Range of Motion (PROM) Comments: proximal 3/4, distal WFL Active Range of Motion (AROM) Comments: shoulder to 90, distal WFL - able to hold and use items for adl tasks with mild difficulty LUE Assessment LUE Assessment: Within Functional Limits  Care Tool Care Tool Self Care Eating   Eating Assist Level: Set up assist    Oral Care    Oral Care Assist Level: Supervision/Verbal cueing    Bathing   Body parts bathed by patient: Right arm;Left arm;Chest;Abdomen;Front perineal area;Face Body parts bathed by helper: Right arm;Left arm;Front perineal area;Buttocks;Right upper leg;Left upper leg;Right lower leg;Left lower leg   Assist Level: Maximal Assistance - Patient 24 - 49%    Upper Body Dressing(including orthotics)   What is the patient wearing?: Pull over shirt   Assist Level: Moderate Assistance - Patient 50 - 74%    Lower Body Dressing (excluding footwear)   What is the patient wearing?: Pants;Incontinence brief Assist for lower body dressing: Total Assistance - Patient < 25%    Putting on/Taking off footwear   What is the patient wearing?: Non-skid slipper socks Assist for footwear: Dependent - Patient 0%       Care Tool Toileting Toileting activity   Assist for toileting: Total Assistance - Patient < 25%     Care Tool Bed Mobility Roll left and right activity        Sit to lying activity        Lying to sitting edge of bed activity         Care Tool Transfers Sit to stand transfer        Chair/bed transfer         Toilet transfer         Care Tool Cognition Expression of Ideas and Wants Expression of Ideas and Wants: Frequent difficulty - frequently exhibits difficulty with expressing needs and ideas   Understanding Verbal and Non-Verbal Content Understanding Verbal and Non-Verbal Content: Sometimes understands - understands only basic conversations or simple, direct phrases. Frequently  requires cues to understand   Memory/Recall Ability *first 3 days only Memory/Recall Ability *first 3 days only: Current season;That he or she is in a hospital/hospital unit    Refer to Care Plan for Long Term Goals  SHORT TERM GOAL WEEK 1 OT Short Term Goal 1 (Week 1): patient will complete functional sit pivot or stand pivot transfers with mod A of one OT Short Term Goal 2 (Week 1): patient will complete UB bathing dressing with min A using right UE atleast 50% OT Short Term Goal 3 (Week 1): patient will complete LB bathing/dressing with mod  A OT Short Term Goal 4 (Week 1): patient will complete 2/3 toileting components with mod A  Recommendations for other services: None    Skilled Therapeutic Intervention  Patient seated in w/c, pleasant and cooperative.  Reviewed role of OT, schedule for therapy and plan for this session.  Evaluation completed as documented above - reviewed goals for therapy and plan of care.  Patient presents with expressive and receptive deficits but able to get wants and needs known with increased time and follow up questions, right side weakness, balance and endurance deficits limiting mobility and all areas of self care.  She is motivated and put forth great effort during evaluation session - she is an excellent candidate for IP rehab program but anticipate that she will need supervision and assistance upon return to home setting.  She participated in adl training session and took a shower - transfers require a stedy at this time due to limited LB strength and endurance.  Overall mod A for UB bathing and dressing and max/dep for LB bathing/dressing.  toileting completed via stedy with max /dep for clothing management and hygiene.  She returned to bed at close of session due to fatigue.  Bed alarm set and call bell in hand.   ADL ADL Grooming: Minimal assistance Where Assessed-Grooming: Sitting at sink;Wheelchair Upper Body Bathing: Moderate assistance Where  Assessed-Upper Body Bathing: Shower Lower Body Bathing: Dependent Where Assessed-Lower Body Bathing: Shower Upper Body Dressing: Moderate assistance Where Assessed-Upper Body Dressing: Wheelchair Lower Body Dressing: Dependent Where Assessed-Lower Body Dressing: Wheelchair Toileting: Dependent Where Assessed-Toileting: Glass blower/designer: Dependent;Maximal assistance;Other (comment) (via stedy) Toilet Transfer Method: Other (comment) (via stedy) Toilet Transfer Equipment: Raised toilet seat Gaffer Transfer: Maximal assistance;Dependent Social research officer, government Method: Other (comment) (via stedy) Youth worker: Manufacturing systems engineer  Transfers Sit to Stand: Moderate Assistance - Patient 50-74%   Discharge Criteria: Patient will be discharged from OT if patient refuses treatment 3 consecutive times without medical reason, if treatment goals not met, if there is a change in medical status, if patient makes no progress towards goals or if patient is discharged from hospital.  The above assessment, treatment plan, treatment alternatives and goals were discussed and mutually agreed upon: by patient  Carlos Levering 05/16/2020, 4:02 PM

## 2020-05-16 NOTE — Progress Notes (Signed)
Foley removed patient tolerated well. Reviewed bladder scans, PVR, and I&O catheterization for no void.

## 2020-05-16 NOTE — Progress Notes (Signed)
Patient Details  Name: Sheila Ray MRN: [id number] Date of Birth: June 01, 1941  Today's Date: 05/16/2020  Hospital Problems: Principal Problem:   Left middle cerebral artery stroke [facility])  Past Medical History:  Past Medical History:  Diagnosis Date  . Arthritis   . Atrial fibrillation ([place])   . Complication of anesthesia   . Diabetes mellitus without complication ([place])   . Diverticulosis   . Fatigue   . Gastric polyp   . Goiter   . Hyperlipidemia   . Hypertension   . Hypothyroid    taken off of thyroid medication 2012014   . Obese   . Osteopenia   . PONV (postoperative nausea and vomiting)   . Postmenopausal   . Rosacea   . Stroke ([place]) 01/15/2013   left thalamic  stroke, small vessel  . Vitamin D deficiency    Past Surgical History:  Past Surgical History:  Procedure Laterality Date  . ABDOMINAL HYSTERECTOMY  1985  . FOOT SURGERY Right 08/2002  . IR CT HEAD LTD  05/03/2020  . IR PERCUTANEOUS ART THROMBECTOMY/INFUSION INTRACRANIAL INC DIAG ANGIO  05/03/2020  . RADIOLOGY WITH ANESTHESIA N/A 05/03/2020   Procedure: IR WITH ANESTHESIA;  Surgeon: Radiologist, Medication, MD;  Location: [facility];  Service: Radiology;  Laterality: N/A;  . right knee arthroscopy   2013   . TONSILLECTOMY    . 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   Procedure: RIGHT TOTAL KNEE ARTHROPLASTY;  Surgeon: Gearlean Alf, MD;  [facility];  Service: Orthopedics;  Laterality: Right;   Social History:  reports that she has never smoked. She has never used smokeless tobacco. She reports that she does not drink alcohol and does not use drugs.  Family / Support Systems Children: Doctor, general practice (Son) Other Supports: Vickie, Curator (PT at [employer] [facility]) Anticipated Caregiver: Son, Daughter in Sports coach, and granddaughter Ability/Limitations of Caregiver: none Caregiver Availability: 24/7  Social History Preferred language: English Religion: Baptist Read: Yes Write: Yes Employment Status: Retired    Abuse/Neglect Abuse/Neglect Assessment Can Be Completed: Yes Physical Abuse: Denies Verbal Abuse: Denies Sexual Abuse: Denies Exploitation of patient/patient's resources: Denies Self-Neglect: Denies  Emotional Status Pt's affect, behavior and adjustment status: No changes in mood or behavior Recent Psychosocial Issues: no Psychiatric History: no Substance Abuse History: no  Patient / Family Perceptions, Expectations & Goals Pt/Family understanding of illness & functional limitations: yes son has been provided with updates Premorbid pt/family roles/activities: Independent (ADLS and Driving) Pt/family expectations/goals: Goal to discharge home W/ son at supervision to [place]: None Premorbid Home Care/DME Agencies: None Transportation available at discharge: Family able to transport Resource referrals recommended: Neuropsychology (Coping)  Discharge Planning Living Arrangements: Alone Support Systems: Children, Other relatives Type of Residence: Private residence (1 Level Home, 3 steps to enter (L side railing)) Insurance Resources: Multimedia programmer (specify) Sports administrator) Financial Screen Referred: No Living Expenses: Lives with family Money Management: Patient Does the patient have any problems obtaining your medications?: No Home Management: Previously independent Patient/Family Preliminary Plans: Plan to stay with son, dtr in Sports coach and granddaughter able to assist Care Coordinator Barriers to Discharge: Incontinence Care Coordinator Barriers to Discharge Comments: Cath Care Coordinator Anticipated Follow Up Needs: HH/OP Expected length of stay: 2-3 weeks  Clinical Impression SW entered room, introduced self, explained role and process. Called son at bedside to inform of same information. No additional questions or concerns. SW will continue to follow up.  Dyanne Iha 05/16/2020, 1:55 PM

## 2020-05-16 NOTE — Progress Notes (Signed)
[facility] PHYSICAL MEDICINE & REHABILITATION PROGRESS NOTE   Subjective/Complaints:    Objective:   US RENAL  Result Date: 05/14/2020 CLINICAL DATA:  Acute renal insufficiency EXAM: RENAL / URINARY TRACT ULTRASOUND COMPLETE COMPARISON:  None. FINDINGS: Right Kidney: Renal measurements: 10.3 by 6.3 x 4.7 cm = volume: 157.6 mL. Echogenicity within normal limits. No mass or hydronephrosis visualized. Mild prominence of the right renal pelvis is nonspecific. Left Kidney: Renal measurements: 10.2 x 6.1 by 4.9 cm = volume: 161.3 mL. Echogenicity within normal limits. No mass or hydronephrosis visualized. Bladder: Decompressed and not evaluated. Other: None. IMPRESSION: 1. Mild distension of the right renal pelvis without frank hydronephrosis. Otherwise unremarkable exam. Electronically Signed   By: Randa Ngo M.D.   On: 05/14/2020 15:47   VAS [place] LOWER EXTREMITY VENOUS (DVT)  Result Date: 05/15/2020  Lower Venous DVT Study Indications: Swelling.  Risk Factors: None identified. Limitations: Body habitus, poor ultrasound/tissue interface and patient positioning, patient immobility. Comparison Study: No prior studies. Performing Technologist: Oliver Hum RVT  Examination Guidelines: A complete evaluation includes B-mode imaging, spectral Doppler, color Doppler, and power Doppler as needed of all accessible portions of each vessel. Bilateral testing is considered an integral part of a complete examination. Limited examinations for reoccurring indications may be performed as noted. The reflux portion of the exam is performed with the patient in reverse Trendelenburg.  +---------+---------------+---------+-----------+----------+--------------+ RIGHT    CompressibilityPhasicitySpontaneityPropertiesThrombus Aging +---------+---------------+---------+-----------+----------+--------------+ CFV      Full           Yes      Yes                                  +---------+---------------+---------+-----------+----------+--------------+ SFJ      Full                                                        +---------+---------------+---------+-----------+----------+--------------+ FV Prox  Full                                                        +---------+---------------+---------+-----------+----------+--------------+ FV Mid   Full                                                        +---------+---------------+---------+-----------+----------+--------------+ FV DistalFull                                                        +---------+---------------+---------+-----------+----------+--------------+ PFV      Full                                                        +---------+---------------+---------+-----------+----------+--------------+  POP      Full           Yes      Yes                                 +---------+---------------+---------+-----------+----------+--------------+ PTV      Full                                                        +---------+---------------+---------+-----------+----------+--------------+ PERO     Full                                                        +---------+---------------+---------+-----------+----------+--------------+   +---------+---------------+---------+-----------+----------+-------------------+ LEFT     CompressibilityPhasicitySpontaneityPropertiesThrombus Aging      +---------+---------------+---------+-----------+----------+-------------------+ CFV      Full           Yes      Yes                                      +---------+---------------+---------+-----------+----------+-------------------+ SFJ      Full                                                             +---------+---------------+---------+-----------+----------+-------------------+ FV Prox  Full                                                              +---------+---------------+---------+-----------+----------+-------------------+ FV Mid   Full                                                             +---------+---------------+---------+-----------+----------+-------------------+ FV Distal                                             Not well visualized +---------+---------------+---------+-----------+----------+-------------------+ PFV      Full                                                             +---------+---------------+---------+-----------+----------+-------------------+ POP      Full           Yes  Yes                                      +---------+---------------+---------+-----------+----------+-------------------+ PTV      Full                                                             +---------+---------------+---------+-----------+----------+-------------------+ PERO                                                  Not well visualized +---------+---------------+---------+-----------+----------+-------------------+     Summary: RIGHT: - No evidence of common femoral vein obstruction.  LEFT: - There is no evidence of deep vein thrombosis in the lower extremity. However, portions of this examination were limited- see technologist comments above.  - No cystic structure found in the popliteal fossa.  *See table(s) above for measurements and observations. Electronically signed by Servando Snare MD on 05/15/2020 at 4:33:29 PM.    Final    Recent Labs    05/15/20 0406 05/16/20 0524  WBC 16.0* 11.5*  HGB 8.9* 9.1*  HCT 28.7* 30.2*  PLT 341 346   Recent Labs    05/15/20 0406 05/16/20 0524  NA 136 138  K 3.5 3.0*  CL 110 109  CO2 20* 20*  GLUCOSE 101* 109*  BUN 19 9  CREATININE 1.14* 0.76  CALCIUM 8.0* 8.3*    Intake/Output Summary (Last 24 hours) at 05/16/2020 1100 Last data filed at 05/16/2020 0500 Gross per 24 hour  Intake 243 ml  Output 1200 ml  Net -957 ml         Physical Exam: Vital Signs Blood pressure 136/77, pulse 88, temperature 97.8 F (36.6 C), temperature source Oral, resp. rate 17, height 5\' 4"  (1.626 m), weight 82 kg, SpO2 97 %.   General: No acute distress Mood and affect are appropriate Heart: Regular rate and rhythm no rubs murmurs or extra sounds Lungs: Clear to auscultation, breathing unlabored, no rales or wheezes Abdomen: Positive bowel sounds, soft nontender to palpation, nondistended Extremities: No clubbing, cyanosis, or edema Skin: No evidence of breakdown, no evidence of rash Neurologic: Cranial nerves II through XII intact, motor strength is 5/5 in Left and 4/5 RIght deltoid, bicep, tricep, grip, hip flexor, knee extensors, ankle dorsiflexor and plantar flexor Sensory exam unable to assess due to fluent aphasia.  +word substitutions Cerebellar exam normal finger to nose to finger as well as heel to shin in bilateral upper and lower extremities Musculoskeletal: Full range of motion in all 4 extremities. No joint swelling   Assessment/Plan: 1. Functional deficits which require 3+ hours per day of interdisciplinary therapy in a comprehensive inpatient rehab setting.  Physiatrist is providing close team supervision and 24 hour management of active medical problems listed below.  Physiatrist and rehab team continue to assess barriers to discharge/monitor patient progress toward functional and medical goals  Care Tool:  Bathing              Bathing assist       Upper Body Dressing/Undressing Upper body dressing  Upper body assist      Lower Body Dressing/Undressing Lower body dressing            Lower body assist       Toileting Toileting    Toileting assist Assist for toileting: Maximal Assistance - Patient 25 - 49%     Transfers Chair/bed transfer  Transfers assist     Chair/bed transfer assist level: Total Assistance - Patient < 25%     Locomotion Ambulation   Ambulation  assist              Walk 10 feet activity   Assist           Walk 50 feet activity   Assist           Walk 150 feet activity   Assist           Walk 10 feet on uneven surface  activity   Assist           Wheelchair     Assist               Wheelchair 50 feet with 2 turns activity    Assist            Wheelchair 150 feet activity     Assist          Blood pressure 136/77, pulse 88, temperature 97.8 F (36.6 C), temperature source Oral, resp. rate 17, height 5\' 4"  (1.626 m), weight 82 kg, SpO2 97 %.  Medical Problem List and Plan: 1.  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  Antithrombotics: -DVT/anticoagulation: Bilateral lower extremity Dopplers negative for DVT.  Eliquis             -antiplatelet therapy: N/A 3. Pain Management: Tylenol as needed. Complains of neck discomfort: will order kpad 4. Mood: Provide emotional support             -antipsychotic agents: N/A 5. Neuropsych: This patient is capable of making decisions on her own behalf. 6. Skin/Wound Care: Routine skin checks 7. Fluids/Electrolytes/Nutrition: Routine in and outs with follow-up chemistries 8.  Atrial fibrillation.  Cardizem 60 mg every 8 hours, Toprol-XL 100 mg daily.  Cardiac rate controlled to 80-90 on 11/15 9.  Diabetes mellitus.  Hemoglobin A1c 6.4.  SSI.  Patient on Glucophage 500 mg daily prior to admission.  Resume as needed CBG (last 3)  Recent Labs    05/15/20 1709 05/15/20 2220 05/16/20 0606  GLUCAP 99 106* 100*  controlled 11/16  10.Hypertension.  Monitor with increased mobility.  Patient on lisinopril 40 mg daily prior to  admission.  Resume as needed. Currently BP is soft.  Vitals:   05/15/20 1900 05/16/20 0426  BP: (!) 113/58 136/77  Pulse: 93 88  Resp: (!) 22 17  Temp: 97.9 F (36.6 C) 97.8 F (36.6 C)  SpO2: 99% 97%   11.  Hyperlipidemia.  Zetia 12.  Left lower extremity cellulitis.  Venous Doppler studies negative.  Complete course of Augmentin initiated 05/15/2020.    LOS: 1 days A FACE TO FACE EVALUATION WAS PERFORMED  Luanna Salk Tallin Hart 05/16/2020, 11:00 AM

## 2020-05-16 NOTE — Progress Notes (Signed)
Inpatient Rehabilitation  Patient information reviewed and entered into eRehab system by Brynn Mulgrew M. Klyn Kroening, M.A., CCC/SLP, PPS Coordinator.  Information including medical coding, functional ability and quality indicators will be reviewed and updated through discharge.    

## 2020-05-16 NOTE — Progress Notes (Signed)
Inpatient Rehabilitation Center Individual Statement of Services  Patient Name:  Sheila Ray  Date:  05/16/2020  Welcome to [facility].  Our goal is to provide you with an individualized program based on your diagnosis and situation, designed to meet your specific needs.  With this comprehensive rehabilitation program, you will be expected to participate in at least 3 hours of rehabilitation therapies Monday-Friday, with modified therapy programming on the weekends.  Your rehabilitation program will include the following services:  Physical Therapy (PT), Occupational Therapy (OT), Speech Therapy (ST), 24 hour per day rehabilitation nursing, Therapeutic Recreaction (TR), Neuropsychology, Care Coordinator, Rehabilitation Medicine, Nutrition Services, Pharmacy Services and Other  Weekly team conferences will be held on Wednesday to discuss your progress.  Your Inpatient Rehabilitation Care Coordinator will talk with you frequently to get your input and to update you on team discussions.  Team conferences with you and your family in attendance may also be held.  Expected length of stay: 2-3 Weeks  Overall anticipated outcome: Supervision to Min A  Depending on your progress and recovery, your program may change. Your Inpatient Rehabilitation Care Coordinator will coordinate services and will keep you informed of any changes. Your Inpatient Rehabilitation Care Coordinator's name and contact numbers are listed  below.  The following services may also be recommended but are not provided by the [facility]:    [facility] will be made to provide these services after discharge if needed.  Arrangements include referral to agencies that provide these services.  Your insurance has been verified to be:  [employer] Your primary doctor is:  Sharion Balloon,  FNP  Pertinent information will be shared with your doctor and your insurance company.  Inpatient Rehabilitation Care Coordinator:  [name], [place] or [phone]  Information discussed with and copy given to patient by: Dyanne Iha, 05/16/2020, 9:55 AM

## 2020-05-16 NOTE — Progress Notes (Signed)
05/16/2020 Moderna booster administered in (L) deltoid, verbal consent given by pt and Dr. Letta Pate. Dan Humphreys, RN-BSN-CCM   Covid-19 Vaccination Clinic  Name:  Sheila Ray    MRN: [id number] DOB: [birth year]  05/16/2020  [name] was observed post Covid-19 immunization for 15 minutes without incident. She was provided with Vaccine Information Sheet and instruction to access the V-Safe system.   [name] was instructed to call 911 with any severe reactions post vaccine: Marland Kitchen Difficulty breathing  . Swelling of face and throat  . A fast heartbeat  . A bad rash all over body  . Dizziness and weakness   Immunizations Administered    Name Date Dose VIS Date Route   Moderna COVID-19 Vaccine 05/16/2020 12:35 PM 0.5 mL 04/19/2020 Intramuscular   Manufacturer: Moderna   Lot: [id number]   [place]: [id number]

## 2020-05-16 NOTE — Evaluation (Signed)
Physical Therapy Assessment and Plan  Patient Details  Name: Sheila Ray MRN: [id number] Date of Birth: 01/19/1941  PT Diagnosis: Abnormality of gait, Cognitive deficits, Coordination disorder, Difficulty walking, Hemiparesis dominant, Impaired cognition, Impaired sensation and Muscle weakness Rehab Potential: Good ELOS: 3 weeks   Today's Date: 05/16/2020 PT Individual Time: 0915-1040 PT Individual Time Calculation (min): 85 min    Hospital Problem: Principal Problem:   Left middle cerebral artery stroke (HCC)   Past Medical History:  Past Medical History:  Diagnosis Date  . Arthritis   . Atrial fibrillation (HCC)   . Complication of anesthesia   . Diabetes mellitus without complication (HCC)   . Diverticulosis   . Fatigue   . Gastric polyp   . Goiter   . Hyperlipidemia   . Hypertension   . Hypothyroid    taken off of thyroid medication 2012014   . Obese   . Osteopenia   . PONV (postoperative nausea and vomiting)   . Postmenopausal   . Rosacea   . 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 Vitamin D deficiency    Past Surgical History:  Past Surgical History:  Procedure Laterality Date  . ABDOMINAL HYSTERECTOMY  1985  . FOOT SURGERY Right 08/2002  . IR CT HEAD LTD  05/03/2020  . IR PERCUTANEOUS ART THROMBECTOMY/INFUSION INTRACRANIAL INC DIAG ANGIO  05/03/2020  . RADIOLOGY WITH ANESTHESIA N/A 05/03/2020   Procedure: IR WITH ANESTHESIA;  Surgeon: Radiologist, Medication, MD;  [facility];  Service: Radiology;  Laterality: N/A;  . right knee arthroscopy   2013   . TONSILLECTOMY    . 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   Procedure: RIGHT TOTAL KNEE ARTHROPLASTY;  Surgeon: Frank Aluisio V, MD;  Location: [facility];  Service: Orthopedics;  Laterality: Right;    Assessment & Plan Clinical Impression: Patient is a 79 y.o. 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  Per chart review lives alone reportedly independent prior to admission and driving.  1 level home 3 steps to entry.  Presented 05/03/2020 with right side weakness facial droop expressive aphasia and altered mental status.  Her heart rate was noted to be in the 140s.  Cranial CT scan showed no acute intracranial hemorrhage no acute infarction.  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  No hemodynamically significant stenosis in the neck.  Patient underwent revascularization of occluded superior division M2 branch per interventional radiology.  MRI of the brain showed cortical restricted diffusion within the left temporal occipital region.  Scattered foci of restricted diffusion also seen in the left frontal parietal and insular regions of the left amygdala.  MRA with no proximal occlusion or stenosis.  EEG negative for seizure.  Echocardiogram with ejection fraction of 55 to 60% no wall motion abnormalities.  Neurology follow-up as well as cardiology services her Xarelto was changed to Eliquis.  She did remain on Cardizem as well as metoprolol for her atrial fibrillation.  Tolerating a regular diet.  Patient with noted leukocytosis 19,400-24,100 05/13/2020 as well as BUN 29 and creatinine 1.92.  Lactic acid within normal limits.  Renal ultrasound without hydronephrosis.  Chest x-ray showed persistent but improving CHF superimposed infection cannot be ruled out.  Urinalysis negative nitrite few bacteria.  Leukocytosis felt likely from underlying left lower extremity cellulitis and placed on   Physical Therapy Assessment and Plan  Patient Details  Name: Sheila Ray MRN: [id number] Date of Birth: 1940/12/22  PT Diagnosis: Abnormality of gait, Cognitive deficits, Coordination disorder, Difficulty walking, Hemiparesis dominant, Impaired cognition, Impaired sensation and Muscle weakness Rehab Potential: Good ELOS: 3 weeks   Today's Date: 05/16/2020 PT Individual Time: 0915-1040 PT Individual Time Calculation (min): 85 min    Hospital Problem: Principal Problem:   Left middle cerebral artery stroke [facility])   Past Medical History:  Past Medical History:  Diagnosis Date  . Arthritis   . Atrial fibrillation ([place])   . Complication of anesthesia   . Diabetes mellitus without complication ([place])   . Diverticulosis   . Fatigue   . Gastric polyp   . Goiter   . Hyperlipidemia   . Hypertension   . Hypothyroid    taken off of thyroid medication 2012014   . Obese   . Osteopenia   . PONV (postoperative nausea and vomiting)   . Postmenopausal   . Rosacea   . Stroke ([place]) 01/15/2013   left thalamic  stroke, small vessel  . Vitamin D deficiency    Past Surgical History:  Past Surgical History:  Procedure Laterality Date  . ABDOMINAL HYSTERECTOMY  1985  . FOOT SURGERY Right 08/2002  . IR CT HEAD LTD  05/03/2020  . IR PERCUTANEOUS ART THROMBECTOMY/INFUSION INTRACRANIAL INC DIAG ANGIO  05/03/2020  . RADIOLOGY WITH ANESTHESIA N/A 05/03/2020   Procedure: IR WITH ANESTHESIA;  Surgeon: Radiologist, Medication, MD;  Location: [place];  Service: Radiology;  Laterality: N/A;  . right knee arthroscopy   2013   . TONSILLECTOMY    . 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   Procedure: RIGHT TOTAL KNEE ARTHROPLASTY;  Surgeon: Gearlean Alf, MD;  [facility];  Service: Orthopedics;  Laterality: Right;    Assessment & Plan Clinical Impression: Patient is a 79 y.o. 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  Per chart review lives alone reportedly independent prior to admission and driving.  1 level home 3 steps to entry.  Presented 05/03/2020 with right side weakness facial droop expressive aphasia and altered mental status.  Her heart rate was noted to be in the 140s.  Cranial CT scan showed no acute intracranial hemorrhage no acute infarction.  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  No hemodynamically significant stenosis in the neck.  Patient underwent revascularization of occluded superior division M2 branch per interventional radiology.  MRI of the brain showed cortical restricted diffusion within the left temporal occipital region.  Scattered foci of restricted diffusion also seen in the left frontal parietal and insular regions of the left amygdala.  MRA with no proximal occlusion or stenosis.  EEG negative for seizure.  Echocardiogram with ejection fraction of 55 to 60% no wall motion abnormalities.  Neurology follow-up as well as cardiology services her Xarelto was changed to Eliquis.  She did remain on Cardizem as well as metoprolol for her atrial fibrillation.  Tolerating a regular diet.  Patient with noted leukocytosis 19,400-24,100 05/13/2020 as well as BUN 29 and creatinine 1.92.  Lactic acid within normal limits.  Renal ultrasound without hydronephrosis.  Chest x-ray showed persistent but improving CHF superimposed infection cannot be ruled out.  Urinalysis negative nitrite few bacteria.  Leukocytosis felt likely from underlying left lower extremity cellulitis and placed on  Physical Therapy Assessment and Plan  Patient Details  Name: Sheila Ray MRN: [id number] Date of Birth: 01/19/1941  PT Diagnosis: Abnormality of gait, Cognitive deficits, Coordination disorder, Difficulty walking, Hemiparesis dominant, Impaired cognition, Impaired sensation and Muscle weakness Rehab Potential: Good ELOS: 3 weeks   Today's Date: 05/16/2020 PT Individual Time: 0915-1040 PT Individual Time Calculation (min): 85 min    Hospital Problem: Principal Problem:   Left middle cerebral artery stroke (HCC)   Past Medical History:  Past Medical History:  Diagnosis Date  . Arthritis   . Atrial fibrillation (HCC)   . Complication of anesthesia   . Diabetes mellitus without complication (HCC)   . Diverticulosis   . Fatigue   . Gastric polyp   . Goiter   . Hyperlipidemia   . Hypertension   . Hypothyroid    taken off of thyroid medication 2012014   . Obese   . Osteopenia   . PONV (postoperative nausea and vomiting)   . Postmenopausal   . Rosacea   . 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 Vitamin D deficiency    Past Surgical History:  Past Surgical History:  Procedure Laterality Date  . ABDOMINAL HYSTERECTOMY  1985  . FOOT SURGERY Right 08/2002  . IR CT HEAD LTD  05/03/2020  . IR PERCUTANEOUS ART THROMBECTOMY/INFUSION INTRACRANIAL INC DIAG ANGIO  05/03/2020  . RADIOLOGY WITH ANESTHESIA N/A 05/03/2020   Procedure: IR WITH ANESTHESIA;  Surgeon: Radiologist, Medication, MD;  [facility];  Service: Radiology;  Laterality: N/A;  . right knee arthroscopy   2013   . TONSILLECTOMY    . 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   Procedure: RIGHT TOTAL KNEE ARTHROPLASTY;  Surgeon: Frank Aluisio V, MD;  [facility];  Service: Orthopedics;  Laterality: Right;    Assessment & Plan Clinical Impression: Patient is a 79 y.o. 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  Per chart review lives alone reportedly independent prior to admission and driving.  1 level home 3 steps to entry.  Presented 05/03/2020 with right side weakness facial droop expressive aphasia and altered mental status.  Her heart rate was noted to be in the 140s.  Cranial CT scan showed no acute intracranial hemorrhage no acute infarction.  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  No hemodynamically significant stenosis in the neck.  Patient underwent revascularization of occluded superior division M2 branch per interventional radiology.  MRI of the brain showed cortical restricted diffusion within the left temporal occipital region.  Scattered foci of restricted diffusion also seen in the left frontal parietal and insular regions of the left amygdala.  MRA with no proximal occlusion or stenosis.  EEG negative for seizure.  Echocardiogram with ejection fraction of 55 to 60% no wall motion abnormalities.  Neurology follow-up as well as cardiology services her Xarelto was changed to Eliquis.  She did remain on Cardizem as well as metoprolol for her atrial fibrillation.  Tolerating a regular diet.  Patient with noted leukocytosis 19,400-24,100 05/13/2020 as well as BUN 29 and creatinine 1.92.  Lactic acid within normal limits.  Renal ultrasound without hydronephrosis.  Chest x-ray showed persistent but improving CHF superimposed infection cannot be ruled out.  Urinalysis negative nitrite few bacteria.  Leukocytosis felt likely from underlying left lower extremity cellulitis and placed on   Physical Therapy Assessment and Plan  Patient Details  Name: Sheila Ray MRN: [id number] Date of Birth: 06-08-1941  PT Diagnosis: Abnormality of gait, Cognitive deficits, Coordination disorder, Difficulty walking, Hemiparesis dominant, Impaired cognition, Impaired sensation and Muscle weakness Rehab Potential: Good ELOS: 3 weeks   Today's Date: 05/16/2020 PT Individual Time: 0915-1040 PT Individual Time Calculation (min): 85 min    Hospital Problem: Principal Problem:   Left middle cerebral artery stroke [facility])   Past Medical History:  Past Medical History:  Diagnosis Date  . Arthritis   . Atrial fibrillation ([place])   . Complication of anesthesia   . Diabetes mellitus without complication ([place])   . Diverticulosis   . Fatigue   . Gastric polyp   . Goiter   . Hyperlipidemia   . Hypertension   . Hypothyroid    taken off of thyroid medication 2012014   . Obese   . Osteopenia   . PONV (postoperative nausea and vomiting)   . Postmenopausal   . Rosacea   . Stroke ([place]) 01/15/2013   left thalamic  stroke, small vessel  . Vitamin D deficiency    Past Surgical History:  Past Surgical History:  Procedure Laterality Date  . ABDOMINAL HYSTERECTOMY  1985  . FOOT SURGERY Right 08/2002  . IR CT HEAD LTD  05/03/2020  . IR PERCUTANEOUS ART THROMBECTOMY/INFUSION INTRACRANIAL INC DIAG ANGIO  05/03/2020  . RADIOLOGY WITH ANESTHESIA N/A 05/03/2020   Procedure: IR WITH ANESTHESIA;  Surgeon: Radiologist, Medication, MD;  Location: [place];  Service: Radiology;  Laterality: N/A;  . right knee arthroscopy   2013   . TONSILLECTOMY    . 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   Procedure: RIGHT TOTAL KNEE ARTHROPLASTY;  Surgeon: Gearlean Alf, MD;  [facility];  Service: Orthopedics;  Laterality: Right;    Assessment & Plan Clinical Impression: Patient is a 79 y.o. 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  Per chart review lives alone reportedly independent prior to admission and driving.  1 level home 3 steps to entry.  Presented 05/03/2020 with right side weakness facial droop expressive aphasia and altered mental status.  Her heart rate was noted to be in the 140s.  Cranial CT scan showed no acute intracranial hemorrhage no acute infarction.  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  No hemodynamically significant stenosis in the neck.  Patient underwent revascularization of occluded superior division M2 branch per interventional radiology.  MRI of the brain showed cortical restricted diffusion within the left temporal occipital region.  Scattered foci of restricted diffusion also seen in the left frontal parietal and insular regions of the left amygdala.  MRA with no proximal occlusion or stenosis.  EEG negative for seizure.  Echocardiogram with ejection fraction of 55 to 60% no wall motion abnormalities.  Neurology follow-up as well as cardiology services her Xarelto was changed to Eliquis.  She did remain on Cardizem as well as metoprolol for her atrial fibrillation.  Tolerating a regular diet.  Patient with noted leukocytosis 19,400-24,100 05/13/2020 as well as BUN 29 and creatinine 1.92.  Lactic acid within normal limits.  Renal ultrasound without hydronephrosis.  Chest x-ray showed persistent but improving CHF superimposed infection cannot be ruled out.  Urinalysis negative nitrite few bacteria.  Leukocytosis felt likely from underlying left lower extremity cellulitis and placed on  Physical Therapy Assessment and Plan  Patient Details  Name: Sheila Ray MRN: [id number] Date of Birth: 06-08-1941  PT Diagnosis: Abnormality of gait, Cognitive deficits, Coordination disorder, Difficulty walking, Hemiparesis dominant, Impaired cognition, Impaired sensation and Muscle weakness Rehab Potential: Good ELOS: 3 weeks   Today's Date: 05/16/2020 PT Individual Time: 0915-1040 PT Individual Time Calculation (min): 85 min    Hospital Problem: Principal Problem:   Left middle cerebral artery stroke [facility])   Past Medical History:  Past Medical History:  Diagnosis Date  . Arthritis   . Atrial fibrillation ([place])   . Complication of anesthesia   . Diabetes mellitus without complication ([place])   . Diverticulosis   . Fatigue   . Gastric polyp   . Goiter   . Hyperlipidemia   . Hypertension   . Hypothyroid    taken off of thyroid medication 2012014   . Obese   . Osteopenia   . PONV (postoperative nausea and vomiting)   . Postmenopausal   . Rosacea   . Stroke ([place]) 01/15/2013   left thalamic  stroke, small vessel  . Vitamin D deficiency    Past Surgical History:  Past Surgical History:  Procedure Laterality Date  . ABDOMINAL HYSTERECTOMY  1985  . FOOT SURGERY Right 08/2002  . IR CT HEAD LTD  05/03/2020  . IR PERCUTANEOUS ART THROMBECTOMY/INFUSION INTRACRANIAL INC DIAG ANGIO  05/03/2020  . RADIOLOGY WITH ANESTHESIA N/A 05/03/2020   Procedure: IR WITH ANESTHESIA;  Surgeon: Radiologist, Medication, MD;  Location: [place];  Service: Radiology;  Laterality: N/A;  . right knee arthroscopy   2013   . TONSILLECTOMY    . 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   Procedure: RIGHT TOTAL KNEE ARTHROPLASTY;  Surgeon: Gearlean Alf, MD;  [facility];  Service: Orthopedics;  Laterality: Right;    Assessment & Plan Clinical Impression: Patient is a 79 y.o. 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  Per chart review lives alone reportedly independent prior to admission and driving.  1 level home 3 steps to entry.  Presented 05/03/2020 with right side weakness facial droop expressive aphasia and altered mental status.  Her heart rate was noted to be in the 140s.  Cranial CT scan showed no acute intracranial hemorrhage no acute infarction.  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  No hemodynamically significant stenosis in the neck.  Patient underwent revascularization of occluded superior division M2 branch per interventional radiology.  MRI of the brain showed cortical restricted diffusion within the left temporal occipital region.  Scattered foci of restricted diffusion also seen in the left frontal parietal and insular regions of the left amygdala.  MRA with no proximal occlusion or stenosis.  EEG negative for seizure.  Echocardiogram with ejection fraction of 55 to 60% no wall motion abnormalities.  Neurology follow-up as well as cardiology services her Xarelto was changed to Eliquis.  She did remain on Cardizem as well as metoprolol for her atrial fibrillation.  Tolerating a regular diet.  Patient with noted leukocytosis 19,400-24,100 05/13/2020 as well as BUN 29 and creatinine 1.92.  Lactic acid within normal limits.  Renal ultrasound without hydronephrosis.  Chest x-ray showed persistent but improving CHF superimposed infection cannot be ruled out.  Urinalysis negative nitrite few bacteria.  Leukocytosis felt likely from underlying left lower extremity cellulitis and placed on

## 2020-05-17 ENCOUNTER — Inpatient Hospital Stay (HOSPITAL_COMMUNITY): Payer: Medicare Other | Admitting: Speech Pathology

## 2020-05-17 ENCOUNTER — Inpatient Hospital Stay (HOSPITAL_COMMUNITY): Payer: Medicare Other | Admitting: Physical Therapy

## 2020-05-17 ENCOUNTER — Inpatient Hospital Stay (HOSPITAL_COMMUNITY): Payer: Medicare Other | Admitting: Occupational Therapy

## 2020-05-17 ENCOUNTER — Inpatient Hospital Stay (HOSPITAL_COMMUNITY): Payer: Medicare Other

## 2020-05-17 DIAGNOSIS — I63512 Cerebral infarction due to unspecified occlusion or stenosis of left middle cerebral artery: Secondary | ICD-10-CM | POA: Diagnosis not present

## 2020-05-17 LAB — GLUCOSE, CAPILLARY
Glucose-Capillary: 86 mg/dL (ref 70–99)
Glucose-Capillary: 116 mg/dL — ABNORMAL HIGH (ref 70–99)
Glucose-Capillary: 100 mg/dL — ABNORMAL HIGH (ref 70–99)
Glucose-Capillary: 98 mg/dL (ref 70–99)

## 2020-05-17 MED ORDER — FLUCONAZOLE 150 MG PO TABS
150.0000 mg | ORAL_TABLET | Freq: Once | ORAL | Status: AC
  Administered 2020-05-17: 150 mg via ORAL
  Filled 2020-05-17: qty 1

## 2020-05-17 MED ORDER — SACCHAROMYCES BOULARDII 250 MG PO CAPS
250.0000 mg | ORAL_CAPSULE | Freq: Two times a day (BID) | ORAL | Status: DC
  Administered 2020-05-17 – 2020-06-06 (×40): 250 mg via ORAL
  Filled 2020-05-17 (×41): qty 1

## 2020-05-17 NOTE — Plan of Care (Signed)
  Problem: RH Balance Goal: LTG Patient will maintain dynamic sitting balance (PT) Description: LTG:  Patient will maintain dynamic sitting balance with assistance during mobility activities (PT) Flowsheets (Taken 05/17/2020 1006) LTG: Pt will maintain dynamic sitting balance during mobility activities with:: Supervision/Verbal cueing Goal: LTG Patient will maintain dynamic standing balance (PT) Description: LTG:  Patient will maintain dynamic standing balance with assistance during mobility activities (PT) Flowsheets (Taken 05/17/2020 1006) LTG: Pt will maintain dynamic standing balance during mobility activities with:: Minimal Assistance - Patient > 75%   Problem: RH Bed Mobility Goal: LTG Patient will perform bed mobility with assist (PT) Description: LTG: Patient will perform bed mobility with assistance, with/without cues (PT). Flowsheets (Taken 05/17/2020 1008) LTG: Pt will perform bed mobility with assistance level of: Contact Guard/Touching assist   Problem: RH Bed to Chair Transfers Goal: LTG Patient will perform bed/chair transfers w/assist (PT) Description: LTG: Patient will perform bed to chair transfers with assistance (PT). Flowsheets (Taken 05/17/2020 1008) LTG: Pt will perform Bed to Chair Transfers with assistance level: Contact Guard/Touching assist   Problem: RH Car Transfers Goal: LTG Patient will perform car transfers with assist (PT) Description: LTG: Patient will perform car transfers with assistance (PT). Flowsheets (Taken 05/17/2020 1008) LTG: Pt will perform car transfers with assist:: Minimal Assistance - Patient > 75%   Problem: RH Ambulation Goal: LTG Patient will ambulate in controlled environment (PT) Description: LTG: Patient will ambulate in a controlled environment, # of feet with assistance (PT). Flowsheets (Taken 05/17/2020 1008) LTG: Pt will ambulate in controlled environ  assist needed:: Minimal Assistance - Patient > 75% LTG: Ambulation distance  in controlled environment: 56ft with LRAD Goal: LTG Patient will ambulate in home environment (PT) Description: LTG: Patient will ambulate in home environment, # of feet with assistance (PT). Flowsheets (Taken 05/17/2020 1008) LTG: Pt will ambulate in home environ  assist needed:: Minimal Assistance - Patient > 75% LTG: Ambulation distance in home environment: 72ft with LRAD   Problem: RH Wheelchair Mobility Goal: LTG Patient will propel w/c in controlled environment (PT) Description: LTG: Patient will propel wheelchair in controlled environment, # of feet with assist (PT) Flowsheets (Taken 05/17/2020 1008) LTG: Pt will propel w/c in controlled environ  assist needed:: Supervision/Verbal cueing LTG: Propel w/c distance in controlled environment: 16ft Goal: LTG Patient will propel w/c in home environment (PT) Description: LTG: Patient will propel wheelchair in home environment, # of feet with assistance (PT). Flowsheets (Taken 05/17/2020 1008) LTG: Pt will propel w/c in home environ  assist needed:: Supervision/Verbal cueing LTG: Propel w/c distance in home environment: 38ft   Problem: RH Stairs Goal: LTG Patient will ambulate up and down stairs w/assist (PT) Description: LTG: Patient will ambulate up and down # of stairs with assistance (PT) Flowsheets (Taken 05/17/2020 1008) LTG: Pt will ambulate up/down stairs assist needed:: Minimal Assistance - Patient > 75% LTG: Pt will  ambulate up and down number of stairs: 3steps with 1 rail

## 2020-05-17 NOTE — Progress Notes (Signed)
Occupational Therapy Session Note  Patient Details  Name: Sheila Ray MRN: [id number] Date of Birth: 09-26-1940  Today's Date: 05/17/2020 OT Individual Time: 1500-1555 OT Individual Time Calculation (min): 55 min    Short Term Goals: Week 1:  OT Short Term Goal 1 (Week 1): patient will complete functional sit pivot or stand pivot transfers with mod A of one OT Short Term Goal 2 (Week 1): patient will complete UB bathing dressing with min A using right UE atleast 50% OT Short Term Goal 3 (Week 1): patient will complete LB bathing/dressing with mod A OT Short Term Goal 4 (Week 1): patient will complete 2/3 toileting components with mod A  Skilled Therapeutic Interventions/Progress Updates:    1:1. Pt received in bed agreeable to OT with no pain reported and requires increased time to arouse from nap. Pt requires MAX at achieve sitting EOB from supine. Pt reporting feeling dizzy, but subsides quickly. BP taken at 135/82. Pt transfers EOB<>w/c via squat pivot to L with MAX A overall and R knee block and demo cuing for hand placement. Pt agreeable to going outsie and ery excited to see sunshine. Pt completes [place] activiites seated with OT assisting RUE reaching MOD ROM with shoulder movements. Pt demo poor motor planning and continuation often stopping between movements. Exited session with pt seated in bed, exit alarm on and call light in reach   Therapy Documentation Precautions:  Precautions Precautions: Fall Restrictions Weight Bearing Restrictions: No General:   Vital Signs:   Pain:   ADL: ADL Grooming: Minimal assistance Where Assessed-Grooming: Sitting at sink, Wheelchair Upper Body Bathing: Moderate assistance Where Assessed-Upper Body Bathing: Shower Lower Body Bathing: Dependent Where Assessed-Lower Body Bathing: Shower Upper Body Dressing: Moderate assistance Where Assessed-Upper Body Dressing: Wheelchair Lower Body Dressing: Dependent Where Assessed-Lower Body  Dressing: Wheelchair Toileting: Dependent Where Assessed-Toileting: Glass blower/designer: Dependent, Maximal assistance, Other (comment) (via stedy) Toilet Transfer Method: Other (comment) (via stedy) Science writer: Raised toilet seat Social research officer, government: Maximal assistance, Dependent Social research officer, government Method: Other (comment) (via stedy) Youth worker: Nurse, learning disability    Praxis   Exercises:   Other Treatments:     Therapy/Group: Individual Therapy  Tonny Branch 05/17/2020, 12:07 PM

## 2020-05-17 NOTE — Progress Notes (Addendum)
Occupational Therapy Session Note  Patient Details  Name: Sheila Ray MRN: [id number] Date of Birth: 12-18-1940  Today's Date: 05/17/2020 OT Individual Time: 0918-1000 OT Individual Time Calculation (min): 42 min    Short Term Goals: Week 1:  OT Short Term Goal 1 (Week 1): patient will complete functional sit pivot or stand pivot transfers with mod A of one OT Short Term Goal 2 (Week 1): patient will complete UB bathing dressing with min A using right UE atleast 50% OT Short Term Goal 3 (Week 1): patient will complete LB bathing/dressing with mod A OT Short Term Goal 4 (Week 1): patient will complete 2/3 toileting components with mod A  Skilled Therapeutic Interventions/Progress Updates:  Patient met lying supine in bed in agreement with OT treatment session. 0/10 pain at rest and with activity although crepitus noted in R shoulder with bed mobility. Assist to don bilateral TED hose, socks and shoes in supine. Supine to EOB with assist to advance RLE toward EOB and elevate trunk with HOB flat. Stedy positioned with assistance to bring RLE to foot rests. Sit to stand from EOB with multimodal cues for hand placement and Mod A. Patient brought to sink for grooming tasks. Patient able to wash hands and face in standing with use of Stedy bilateral forearms resting on Stedy. Sit to stand in wc with Mod A for controlled descent. Patient set-up with meal tray with ability to incorporate RUE in task without cueing. Patient able to grasp cup of applesauce and take a bite and grasp coffee mug and bring to mouth to take sips. Patient limited by receptive and expressive aphasia although patient is able to make most needs known. Patient give soft call bell yesterday. Trail with pushing call bell on standard remote with patient able to perform without assist. Patient also able to express need to toilet with staff once she pushed call bell. Session concluded with patient seated in wc with call bell within reach,  belt alarm activated, and all needs met.   Therapy Documentation Precautions:  Precautions Precautions: Fall Restrictions Weight Bearing Restrictions: No General:   Therapy/Group: Individual Therapy  Nocholas Damaso R [place]Davis 05/17/2020, 9:50 AM

## 2020-05-17 NOTE — Progress Notes (Signed)
Physical Therapy Session Note  Patient Details  Name: Sheila Ray MRN: [id number] Date of Birth: 06-30-1941  Today's Date: 05/17/2020 PT Individual Time: 1100-1200 PT Individual Time Calculation (min): 60 min   Short Term Goals: Week 1:  PT Short Term Goal 1 (Week 1): pt to demonstrate min A for supine<>sit PT Short Term Goal 2 (Week 1): pt to demonstrate mod A functional transfers with LRAD PT Short Term Goal 3 (Week 1): pt to demonstrate 29' with WC propulsion min A PT Short Term Goal 4 (Week 1): pt demonstrate static standing balance min A PT Short Term Goal 5 (Week 1): iniate gait  Skilled Therapeutic Interventions/Progress Updates: Pt presented in w/c agreeable to therapy. Pt initially peseverating on being "awake" and wakes herself up when falling asleep" but then states she slept through the night. Pt denies pain at start of session. Pt transported to rehab gym for time management and energy conservation. Performed squat pivot to mat on L with maxA and max sequencing. Pt required [facility] assist for hand placement due to poor motor planning, and max facilitation to shift over to mat. Particiapted in sitting balance activities including recognizing and placing cards on board. Pt initially used LUE for x 2 cards however switched to RUE for remaining 7. Pt was able to correctly place 7/9 cards without cues and was able to place 4/7 cards with RUE then requiring minA due to fatigue with remaining x 3. Pt was able to successfully returning to midline with all reaches. Pt then participated in obtaining cups from bench with RUE and handing to PTA with RUE performing x 5 reaches to R and L crossing midline. With multimodal cues pt able to perform reaching to R however required minA reaches to L. Participated in STS x 5 with RW and modA. Pt required increased time between stands and max cues for hand placement. Pt also participated in pre-gait performing forward/backward for wt shifting with LLE. Pt then  performed squat pivot to L back into w/c with maxA. Pt transported back to room at end of session and encouraged to remain in w/c to allow pt to eat lunch. Pt left in w/c with belt alarm on, call bell within reach and needs met.      Therapy Documentation Precautions:  Precautions Precautions: Fall Restrictions Weight Bearing Restrictions: No General:   Vital Signs: Therapy Vitals Temp: 98.2 F (36.8 C) Pulse Rate: 75 Resp: 18 BP: (!) 119/59 Patient Position (if appropriate): Lying Oxygen Therapy SpO2: 98 % O2 Device: Room Air Pain: Pain Assessment Pain Scale: 0-10 Pain Score: 0-No pain Mobility:   Locomotion :    Trunk/Postural Assessment :    Balance:   Exercises:   Other Treatments:      Therapy/Group: Individual Therapy  Deriona Altemose 05/17/2020, 3:54 PM

## 2020-05-17 NOTE — Patient Care Conference (Signed)
Inpatient RehabilitationTeam Conference and Plan of Care Update Date: 05/17/2020   Time: 10:07 AM    Patient Name: Sheila Ray      Medical Record Number: [id number]  Date of Birth: 07/21/1940 Sex: Female         Room/Bed: 4W10C/4W10C-01 Payor Info: Payor: [employer] MEDICARE / Plan: [facility] MEDICARE / Product Type: *No Product type* /    Admit Date/Time:  05/15/2020  2:03 PM  Primary Diagnosis:  Left middle cerebral artery stroke [facility])  Hospital Problems: Principal Problem:   Left middle cerebral artery stroke [facility])    Expected Discharge Date: Expected Discharge Date: 06/06/20  Team Members Present: Physician leading conference: Dr. Alysia Penna Care Coodinator Present: Dorien Chihuahua, RN, BSN, CRRN;Christina Sampson Goon, [place] Nurse Present: Rayne Du, LPN PT Present: Barrie Folk, PT OT Present: Turner Daniels, OT SLP Present: Jettie Booze, CF-SLP PPS Coordinator present : Ileana Ladd, Burna Mortimer, SLP     Current Status/Progress Goal Weekly Team Focus  Bowel/Bladder   inc B/B, I/O performed 1250 return, PVR/Bladder scan Q4-6, Thick creamy/beige vaginal disharge noted, LBM 05/17/2020  Continent B/B  Timed toileting Q2hr, I/O cath PRN   Swallow/Nutrition/ Hydration   regular textures, thin liquids, independent with intake/strategies  n/a - no follow up for swallowing indicated  n/a   ADL's   mod A UB bathing/dressing, max/dep LB bathing/dressing, transfer via stedy - mod A to stand  CS/min A  transfer training, adl training, seated balance and functional use of right UE   Mobility             Communication   Mixed aphasia, expressive deficits > receptive. Mod-Max A functional phrase/sentence level communication, Min-Mod comprehension depending on complexity  Min  expressing basic wants and needs, word finding, awareness of verbal errors (inconsistent), following directoins   Safety/Cognition/ Behavioral Observations  Mod -Max A  supervision   recall, safety, basic familiar problem solving and emergent awareness, selctive attention   Pain   C/O HA, Generalized pain, Tylenol with relief  PT pain free  Assess for pain Qshift, medicate as ordered, Reassess for relief, Call MD for unrelieved pain   Skin   Boggy red heels bilateral, foam applied. Abdominal bruising bilateral, LT hand bruised. Dry patchy red areas perianal, vaginal, groin.  Intact skin, free of breakdown/infection  Assess skin Qshift and PRN, treatments as directed     Discharge Planning:      Team Discussion: Aphasia and right hemiparesis post Left MCA CVA. BP stable. Monitoring potassium level; nutritional deficit. Continue abx through 05/18/20 and treat vaginal yeast infection. Cream to irritated area on buttocks from abx and toileting protocol. Able to communicate need for toileting. Patient on target to meet rehab goals: yes, Min assist goals set; with supervision for wheelchair level at discharge  [place] and progress notes for long and short-term goals.   Revisions to Treatment Plan:   Teaching Needs: Transfers, toileting, medications, skin care, etc.   Current Barriers to Discharge: Decreased caregiver support and Home enviroment access/layout  Possible Resolutions to Barriers: Family education     Medical Summary Current Status: vaginal yeast infection, aphasic, incont with loose stool  Barriers to Discharge: Medical stability;Incontinence   Possible Resolutions to [employer] Focus: loose stools may improve off abx, may need pro biotic will trial   Continued Need for Acute Rehabilitation Level of Care: The patient requires daily medical management by a physician with specialized training in physical medicine and rehabilitation for the following reasons: Direction of  a multidisciplinary physical rehabilitation program to maximize functional independence : Yes Medical management of patient stability for increased activity during  participation in an intensive rehabilitation regime.: Yes Analysis of laboratory values and/or radiology reports with any subsequent need for medication adjustment and/or medical intervention. : Yes   I attest that I was present, lead the team conference, and concur with the assessment and plan of the team.   Dorien Chihuahua B 05/17/2020, 3:04 PM

## 2020-05-17 NOTE — Progress Notes (Signed)
Pt was incontinent of stool, abdomen distended, Pt when asked stated yes to discomfort, Pt was bladder scanned, 893, In/out cath performed with 1250 return. Pt tolerated well, will continue to monitor.

## 2020-05-17 NOTE — Progress Notes (Signed)
Pt offered female urinal /bedpan/Stedy for BR states no urge to void. Pt scanned for 319 ml cathed for 400 ml .

## 2020-05-17 NOTE — Progress Notes (Signed)
Speech Language Pathology Daily Session Note  Patient Details  Name: NASIRA JANUSZ MRN: [id number] Date of Birth: 1940-11-18  Today's Date: 05/17/2020 SLP Individual Time: 4193-7902 SLP Individual Time Calculation (min): 40 min  Short Term Goals: Week 1: SLP Short Term Goal 1 (Week 1): Pt will follow 2-step commands with 50% accuracy provided Max A multimodal cues. SLP Short Term Goal 2 (Week 1): Pt will demonstrate ability to problem solve functional situations with Mod A verbal/visual cues. SLP Short Term Goal 3 (Week 1): Pt will demonstrate recall of functional daily information and/or safety precautions with Mod A verbal/visual cues. SLP Short Term Goal 4 (Week 1): Pt will communicate at the phrase level to express basic wants and need with Mod A multimodal cues. SLP Short Term Goal 5 (Week 1): Pt will name produce phrase level utterances with Mod A multimodal cues for word finding strategies. SLP Short Term Goal 6 (Week 1): Pt will detect verbal and/or functional errors with Mod A multimodal cues.  Skilled Therapeutic Interventions: Pt was seen for skilled ST targeting communication goals. Pt was sleeping upon arrival but aroused appropriately with soft-medium volume voice of therapist. She immediately began attempting to communicate with SLP, but perseverative on word "window". SLP able to determine pt was uncomfortable in bed and assisted with repositioning (after Mod-Max A clarifying yes/no questions). Given level of difficulty pt appears to be having communicating her basic wants and needs verbally, SLP introduced a basic 6 picture and word communication board. Pt demonstrated ability to use it with 70% increasing to 90% accuracy with Mod faded to Min A verbal and visual cues (1st attempt vs second attempt after teaching and feedback). Pt also participated in phrase closure tasks with 50% accuracy and Mod A verbal cueing for awareness of errors. Pt with 70% accuracy when reading during  phrase closure task, and able to correct most verbal errors when provided a visual support for the correct bject name, and SLP repeating the beginning of phrase. Pt left laying in bed with alarm set and needs within reach. Continue per current plan of care.          Pain Pain Assessment Pain Scale: 0-10 Pain Score: 0-No pain  Therapy/Group: Individual Therapy  Arbutus Leas 05/17/2020, 3:01 PM

## 2020-05-17 NOTE — Progress Notes (Signed)
Sheila Ray PHYSICAL MEDICINE & REHABILITATION PROGRESS NOTE   Subjective/Complaints:  Per LPN, cheesy vaginal d/c , pt is aphasic so cannot obtain hx   ROS- limited by aphasia  Reviewed labs  Objective:   No results found. Recent Labs    05/15/20 0406 05/16/20 0524  WBC 16.0* 11.5*  HGB 8.9* 9.1*  HCT 28.7* 30.2*  PLT 341 346   Recent Labs    05/15/20 0406 05/16/20 0524  NA 136 138  K 3.5 3.0*  CL 110 109  CO2 20* 20*  GLUCOSE 101* 109*  BUN 19 9  CREATININE 1.14* 0.76  CALCIUM 8.0* 8.3*    Intake/Output Summary (Last 24 hours) at 05/17/2020 0938 Last data filed at 05/17/2020 0700 Gross per 24 hour  Intake 510 ml  Output 1650 ml  Net -1140 ml        Physical Exam: Vital Signs Blood pressure 132/66, pulse 90, temperature 97.6 F (36.4 C), temperature source Oral, resp. rate 16, height 5' 4"  (1.626 m), weight 82 kg, SpO2 96 %.   General: No acute distress Mood and affect are appropriate Heart: Regular rate and rhythm no rubs murmurs or extra sounds Lungs: Clear to auscultation, breathing unlabored, no rales or wheezes Abdomen: Positive bowel sounds, soft nontender to palpation, nondistended Extremities: No clubbing, cyanosis, or edema Skin: No evidence of breakdown, no evidence of rash Neurologic: Cranial nerves II through XII intact, motor strength is 5/5 in Left and 4/5 RIght deltoid, bicep, tricep, grip, hip flexor, knee extensors, ankle dorsiflexor and plantar flexor Sensory exam unable to assess due to fluent aphasia.  +word substitutions Cerebellar exam normal finger to nose to finger as well as heel to shin in bilateral upper and lower extremities Musculoskeletal: Full range of motion in all 4 extremities. No joint swelling   Assessment/Plan: 1. Functional deficits which require 3+ hours per day of interdisciplinary therapy in a comprehensive inpatient rehab setting.  Physiatrist is providing close team supervision and 24 hour management of  active medical problems listed below.  Physiatrist and rehab team continue to assess barriers to discharge/monitor patient progress toward functional and medical goals  Care Tool:  Bathing    Body parts bathed by patient: Right arm, Left arm, Chest, Abdomen, Front perineal area, Face   Body parts bathed by helper: Right arm, Left arm, Front perineal area, Buttocks, Right upper leg, Left upper leg, Right lower leg, Left lower leg     Bathing assist Assist Level: Maximal Assistance - Patient 24 - 49%     Upper Body Dressing/Undressing Upper body dressing   What is the patient wearing?: Pull over shirt    Upper body assist Assist Level: Moderate Assistance - Patient 50 - 74%    Lower Body Dressing/Undressing Lower body dressing      What is the patient wearing?: Pants, Incontinence brief     Lower body assist Assist for lower body dressing: 2 Helpers     Toileting Toileting    Toileting assist Assist for toileting: Total Assistance - Patient < 25%     Transfers Chair/bed transfer  Transfers assist     Chair/bed transfer assist level: Total Assistance - Patient < 25%     Locomotion Ambulation   Ambulation assist   Ambulation activity did not occur: Safety/medical concerns          Walk 10 feet activity   Assist  Walk 10 feet activity did not occur: Safety/medical concerns        Walk 50  feet activity   Assist Walk 50 feet with 2 turns activity did not occur: Safety/medical concerns         Walk 150 feet activity   Assist Walk 150 feet activity did not occur: Safety/medical concerns         Walk 10 feet on uneven surface  activity   Assist Walk 10 feet on uneven surfaces activity did not occur: Safety/medical concerns         Wheelchair     Assist Will patient use wheelchair at discharge?: Yes Type of Wheelchair: Manual    Wheelchair assist level: Total Assistance - Patient < 25% Max wheelchair distance: 150     Wheelchair 50 feet with 2 turns activity    Assist        Assist Level: Maximal Assistance - Patient 25 - 49%   Wheelchair 150 feet activity     Assist      Assist Level: Total Assistance - Patient < 25%   Blood pressure 132/66, pulse 90, temperature 97.6 F (36.4 C), temperature source Oral, resp. rate 16, height $RemoveBe'5\' 4"'mDrszKkjV$  (1.626 m), weight 82 kg, SpO2 96 %.  Medical Problem List and Plan: 1.  Right side weakness facial droop with aphasia secondary to left MCA scattered infarcts to the left M2 occlusion status post revascularization as well as history of prior left thalamic infarction without residual weakness             -Team conference today please see physician documentation under team conference tab, met with team  to discuss problems,progress, and goals. Formulized individual treatment plan based on medical history, underlying problem and comorbidities.              -ELOS/Goals: 2-3 weeks S and MinA Team conf in am  2.  Antithrombotics: -DVT/anticoagulation: Bilateral lower extremity Dopplers negative for DVT.  Eliquis             -antiplatelet therapy: N/A 3. Pain Management: Tylenol as needed. Complains of neck discomfort: will order kpad 4. Mood: Provide emotional support             -antipsychotic agents: N/A 5. Neuropsych: This patient is capable of making decisions on her own behalf. 6. Skin/Wound Care: Routine skin checks Augmentin for cellulitis thru 11/18 7. Fluids/Electrolytes/Nutrition: Routine in and outs with follow-up chemistries 8.  Atrial fibrillation.  Cardizem 60 mg every 8 hours, Toprol-XL 100 mg daily.  Cardiac rate controlled to 80-90 on 11/15 9.  Diabetes mellitus.  Hemoglobin A1c 6.4.  SSI.  Patient on Glucophage 500 mg daily prior to admission.  Resume as needed CBG (last 3)  Recent Labs    05/16/20 1621 05/16/20 2105 05/17/20 0559  GLUCAP 109* 120* 86  controlled 11/17  10.Hypertension.  Monitor with increased mobility.  Patient on  lisinopril 40 mg daily prior to admission.  Resume as needed. Currently BP is soft.  Vitals:   05/16/20 2310 05/17/20 0549  BP: (!) 121/58 132/66  Pulse:  90  Resp:  16  Temp:  97.6 F (36.4 C)  SpO2:  96%  controlled 11/17 11.  Hyperlipidemia.  Zetia 12.  Left lower extremity cellulitis.  Venous Doppler studies negative.  Complete course of Augmentin initiated 05/15/2020.  13.  Hypo K not on diuretics, may be nutritional , supplement x 2 d an drepeat BMET in am  14.  Leukocytosis without fever improving, cont to monitor, may be resolving cellulitis   LOS: 2 days A FACE TO FACE EVALUATION WAS  PERFORMED  Charlett Blake 05/17/2020, 9:38 AM

## 2020-05-17 NOTE — Progress Notes (Signed)
Patient ID: Sheila Ray, female   DOB: 1941-04-22, 79 y.o.   MRN: [id number] Team Conference Report to Patient/Family  Team Conference discussion was reviewed with the patient and caregiver, including goals, any changes in plan of care and target discharge date.  Patient and caregiver express understanding and are in agreement.  The patient has a target discharge date of 06/06/20.  Dyanne Iha 05/17/2020, 1:22 PM

## 2020-05-18 ENCOUNTER — Inpatient Hospital Stay (HOSPITAL_COMMUNITY): Payer: Medicare Other | Admitting: Physical Therapy

## 2020-05-18 ENCOUNTER — Other Ambulatory Visit: Payer: Self-pay | Admitting: Family

## 2020-05-18 ENCOUNTER — Inpatient Hospital Stay (HOSPITAL_COMMUNITY): Payer: Medicare Other | Admitting: Occupational Therapy

## 2020-05-18 ENCOUNTER — Inpatient Hospital Stay (HOSPITAL_COMMUNITY): Payer: Medicare Other | Admitting: Speech Pathology

## 2020-05-18 DIAGNOSIS — I152 Hypertension secondary to endocrine disorders: Secondary | ICD-10-CM

## 2020-05-18 DIAGNOSIS — E119 Type 2 diabetes mellitus without complications: Secondary | ICD-10-CM

## 2020-05-18 DIAGNOSIS — I63512 Cerebral infarction due to unspecified occlusion or stenosis of left middle cerebral artery: Secondary | ICD-10-CM | POA: Diagnosis not present

## 2020-05-18 DIAGNOSIS — I4891 Unspecified atrial fibrillation: Secondary | ICD-10-CM

## 2020-05-18 LAB — BASIC METABOLIC PANEL
Anion gap: 9 (ref 5–15)
BUN: 5 mg/dL — ABNORMAL LOW (ref 8–23)
CO2: 18 mmol/L — ABNORMAL LOW (ref 22–32)
Calcium: 8.6 mg/dL — ABNORMAL LOW (ref 8.9–10.3)
Chloride: 112 mmol/L — ABNORMAL HIGH (ref 98–111)
Creatinine, Ser: 0.59 mg/dL (ref 0.44–1.00)
GFR, Estimated: >60 mL/min (ref >60)
Glucose, Bld: 96 mg/dL (ref 70–99)
Potassium: 3.5 mmol/L (ref 3.5–5.1)
Sodium: 139 mmol/L (ref 135–145)

## 2020-05-18 LAB — GLUCOSE, CAPILLARY
Glucose-Capillary: 87 mg/dL (ref 70–99)
Glucose-Capillary: 92 mg/dL (ref 70–99)
Glucose-Capillary: 127 mg/dL — ABNORMAL HIGH (ref 70–99)
Glucose-Capillary: 107 mg/dL — ABNORMAL HIGH (ref 70–99)
Glucose-Capillary: 108 mg/dL — ABNORMAL HIGH (ref 70–99)

## 2020-05-18 NOTE — Progress Notes (Signed)
Physical Therapy Session Note  Patient Details  Name: Sheila Ray MRN: [id number] Date of Birth: 05/02/1941  Today's Date: 05/18/2020 PT Individual Time:[phone]   13mn  Short Term Goals: Week 1:  PT Short Term Goal 1 (Week 1): pt to demonstrate min A for supine<>sit PT Short Term Goal 2 (Week 1): pt to demonstrate mod A functional transfers with LRAD PT Short Term Goal 3 (Week 1): pt to demonstrate 523 with WC propulsion min A PT Short Term Goal 4 (Week 1): pt demonstrate static standing balance min A PT Short Term Goal 5 (Week 1): iniate gait  Skilled Therapeutic Interventions/Progress Updates:   Pt received supine in bed and agreeable to PT. Supine>sit transfer with mod assist overall and max cues for initiation and sequencing to push into sitting through log roll.   [facility] for safety and improve lateral weig ht shift to the L. [facility]<>stand x 5 with mod assist and max cues for BUE and BLE placement. Mild R lateral lean noted.  Pregait stepping to circular target to floor x 5 BLE with moderate cues for initiation of movement and mod assist for adequate while shift to the L to advance the RLE.   Gait training with RW [id number] mod assist and max cues for midline orientation and initiation of step on the R side. Pt demonstrating increasing pushers response with fatigue limiting step height on the RLE.   Kinetron reciprocal marching 5 x 30sec with 1 min rest break between bouts.  Cues for full ROM on the RLE to improve hip extension.    Pt performed lateral reach to the L to improve L side weight shift x 6 with moderate cues for posture and proper LUE position. Pt able to demonstrate improved lateral shift to the L and terminal knee extension on the R with increased repetitions.   Pt returned to room and performed stand pivot transfer to bed with RW and mod assist to advance the RLE. Sit>supine  completed with min assist from PT to control the RLE, and left supine in bed with call bell in reach and all needs met.        Therapy Documentation Precautions:  Precautions Precautions: Fall Restrictions Weight Bearing Restrictions: No Vital Signs: Therapy Vitals Temp: 97.9 F (36.6 C) Pulse Rate: 81 Resp: 16 BP: 103/65 Patient Position (if appropriate): Lying Oxygen Therapy SpO2: 96 % O2 Device: Room Air Pain: Pain Assessment Pain Scale: 0-10 Pain Score: 0-No pain    Therapy/Group: Individual Therapy  AusLorie Phenix/18/2021, 5:14 PM

## 2020-05-18 NOTE — IPOC Note (Signed)
Overall Plan of Care [facility]) Patient Details Name: Sheila Ray MRN: [id number] DOB: 1940-12-14  Admitting Diagnosis: Left middle cerebral artery stroke [facility])  Hospital Problems: Principal Problem:   Left middle cerebral artery stroke [facility])     Functional Problem List: Nursing Bladder, Bowel, Edema, Medication Management, Motor, Endurance, Perception, Safety  PT Balance, Behavior, Edema, Endurance, Motor, Perception, Safety, Sensory, Skin Integrity  OT Balance, Cognition, Edema, Endurance, Motor, Perception, Safety  SLP Linguistic, Cognition  TR         Basic ADL's: OT Eating, Grooming, Bathing, Dressing, Toileting     Advanced  ADL's: OT Simple Meal Preparation, Full Meal Preparation, Laundry, Light Housekeeping     Transfers: PT Bed Mobility, Bed to Chair  OT Toilet, Tub/Shower     Locomotion: PT Ambulation, Wheelchair Mobility, Stairs     Additional Impairments: OT Fuctional Use of Upper Extremity  SLP Communication, Social Cognition expression Problem Solving, Memory, Awareness, Attention  TR      Anticipated Outcomes Item Anticipated Outcome  Self Feeding independent  Swallowing      Basic self-care  min a  Toileting  min a   Bathroom Transfers min a  Bowel/Bladder  remain continent of bowel, resume normal voiding pattern  Transfers  CGA  Locomotion  supervision with WC; min A gait  Communication  Min A  Cognition  Supervisoin A  Pain  no pain or less than 2  Safety/Judgment  remain free of falls, skin breakdown and infection   Therapy Plan: PT Intensity: Minimum of 1-2 x/day ,45 to 90 minutes PT Frequency: 5 out of 7 days PT Duration Estimated Length of Stay: 3 weeks OT Intensity: Minimum of 1-2 x/day, 45 to 90 minutes OT Frequency: 5 out of 7 days OT Duration/Estimated Length of Stay: 3 weeks SLP Intensity: Minumum of 1-2 x/day, 30 to 90 minutes SLP Frequency: 3 to 5 out of 7 days SLP Duration/Estimated Length of Stay: 3-4 weeks    Due to the current state of emergency, patients may not be receiving their 3-hours of Medicare-mandated therapy.   Team Interventions: Nursing Interventions Patient/Family Education, Bladder Management, Bowel Management, Pain Management, Medication Management, Skin Care/Wound Management, Cognitive Remediation/Compensation, Psychosocial Support, Discharge Planning  PT interventions Ambulation/gait training, Discharge planning, Functional mobility training, Psychosocial support, Therapeutic Activities, Visual/perceptual remediation/compensation, Balance/vestibular training, Disease management/prevention, Neuromuscular re-education, Skin care/wound management, Therapeutic Exercise, Wheelchair propulsion/positioning, UE/LE Strength taining/ROM, Splinting/orthotics, Pain management, DME/adaptive equipment instruction, Cognitive remediation/compensation, Community reintegration, Functional electrical stimulation, Patient/family education, IT trainer, UE/LE Coordination activities  OT Interventions Training and development officer, Cognitive remediation/compensation, Discharge planning, DME/adaptive equipment instruction, Functional mobility training, Neuromuscular re-education, Patient/family education, Self Care/advanced ADL retraining, Therapeutic Activities, Therapeutic Exercise, UE/LE Strength taining/ROM, UE/LE Coordination activities  SLP Interventions Cognitive remediation/compensation, Cueing hierarchy, Speech/Language facilitation, Functional tasks, Patient/family education, Therapeutic Activities, Internal/external aids  TR Interventions    SW/CM Interventions Discharge Planning, Psychosocial Support, Patient/Family Education   Barriers to Discharge MD  Medical stability and Incontinence  Nursing      PT Inaccessible home environment, Decreased caregiver support, Home environment access/layout, Behavior, Lack of/limited family support decreased family support at home  OT      SLP Decreased  caregiver support Pt lived alone prior to admission and does not have 24/7 supervision at discharge. Anticipate pt will need this level of support at d/c due to cognition from ST standpoint.  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  Per chart review lives alone reportedly independent prior to admission and driving.  1 level home 3 steps to entry.  Presented 05/03/2020 with right side weakness facial droop expressive aphasia and altered mental status.  Her heart rate was noted to be in the 140s.  Cranial CT scan showed no acute intracranial hemorrhage no acute infarction.  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  No hemodynamically significant stenosis in the neck.  Patient underwent revascularization of occluded superior division M2 branch per interventional radiology.  MRI of the brain showed cortical restricted diffusion within the left temporal occipital region.  Scattered foci of restricted diffusion also seen in the left frontal parietal and insular regions of the left amygdala.  MRA with no proximal occlusion or stenosis.  EEG negative for seizure.  Echocardiogram with ejection fraction of 55 to 60% no wall motion abnormalities.   Neurology follow-up as well as cardiology services her Xarelto was changed to Eliquis.  She did remain on Cardizem as well as metoprolol for her atrial fibrillation.  Tolerating a regular diet.  Patient with noted leukocytosis 19,400-24,100 05/13/2020 as well as BUN 29 and creatinine 1.92.  Lactic acid within normal limits.  Renal ultrasound without hydronephrosis.  Chest x-ray showed persistent but improving CHF superimposed infection cannot be ruled out.  Urinalysis negative nitrite few bacteria.  Leukocytosis felt likely from underlying left lower extremity cellulitis and placed on Augmentin 05/15/2020 with WBC improving to 16,000.  Therapy evaluations completed and patient was admitted for a comprehensive rehab program.   Now requiring 24/7 Rehab RN,MD, as well as CIR level PT, OT and SLP.  Treatment team will focus on ADLs and mobility with goals set at [facility]  See Team Conference Notes for weekly updates to the plan of care

## 2020-05-18 NOTE — Progress Notes (Signed)
Occupational Therapy Session Note  Patient Details  Name: Sheila Ray MRN: [id number] Date of Birth: 14-Jun-1941  Today's Date: 05/18/2020 OT Individual Time: 0732-0830 OT Individual Time Calculation (min): 58 min    Short Term Goals: Week 1:  OT Short Term Goal 1 (Week 1): patient will complete functional sit pivot or stand pivot transfers with mod A of one OT Short Term Goal 2 (Week 1): patient will complete UB bathing dressing with min A using right UE atleast 50% OT Short Term Goal 3 (Week 1): patient will complete LB bathing/dressing with mod A OT Short Term Goal 4 (Week 1): patient will complete 2/3 toileting components with mod A  Skilled Therapeutic Interventions/Progress Updates:  Patient met seated on commode in bathroom via Stedy with nursing staff present. 0/10 pain at rest and with activity. Max A for sit to stand in [place] and Max A for hygiene/clothing management with cues for posture and hand placement. OT obtained tub transfer bench in prep for bathing task. Stand to sit on TTB from [place] with Mod A for controlled decent. Patient able to bathe RUE/LUE, chest, and abdomen with assist to wash front perineal area 2/2 body habitus. Patient also able to bathe R/L upper legs with assist to wash buttocks, lower legs and feet. Use of RUE throughout task with noted decreased shoulder abduction/flexion. Mild ataxia noted in RUE throughout task. UB dressing with patient able to thread BUE and head with assist to pull sweater down over chest and abdomen only. Max A to thread BLE into brief/pants and hike over hips while standing in [place]. Due to receptive aphasia, patient required 1 step verbal cues throughout task. 2/4 DOE noted with increased need for rest breaks. Patient declined oral hygiene at sink level with request to return to bed 2/2 fatigue. Session concluded with patient lying supine in bed with call bell within reach, bed alarm activated and all needs met.   Therapy  Documentation Precautions:  Precautions Precautions: Fall Restrictions Weight Bearing Restrictions: No General:    Therapy/Group: Individual Therapy  [name] 05/18/2020, 7:14 AM

## 2020-05-18 NOTE — Progress Notes (Signed)
[place] PHYSICAL MEDICINE & REHABILITATION PROGRESS NOTE   Subjective/Complaints: Pt is aphasic but can state her name , not hospital  Did not require I/O cath last noght per LPN   ROS- limited by aphasia  Reviewed labs  Objective:   No results found. Recent Labs    05/16/20 0524  WBC 11.5*  HGB 9.1*  HCT 30.2*  PLT 346   Recent Labs    05/16/20 0524 05/18/20 0525  NA 138 139  K 3.0* 3.5  CL 109 112*  CO2 20* 18*  GLUCOSE 109* 96  BUN 9 5*  CREATININE 0.76 0.59  CALCIUM 8.3* 8.6*    Intake/Output Summary (Last 24 hours) at 05/18/2020 0902 Last data filed at 05/18/2020 0700 Gross per 24 hour  Intake 340 ml  Output --  Net 340 ml        Physical Exam: Vital Signs Blood pressure 134/69, pulse 88, temperature 98.4 F (36.9 C), resp. rate 18, height 5\' 4"  (1.626 m), weight 82 kg, SpO2 95 %.   General: No acute distress Mood and affect are appropriate Heart: Regular rate and rhythm no rubs murmurs or extra sounds Lungs: Clear to auscultation, breathing unlabored, no rales or wheezes Abdomen: Positive bowel sounds, soft nontender to palpation, nondistended Extremities: No clubbing, cyanosis, or edema Skin: No evidence of breakdown, no evidence of rash Neurologic: Cranial nerves II through XII intact, motor strength is 5/5 in Left and 4/5 RIght deltoid, bicep, tricep, grip,3/5 R hip flexor, knee extensors, ankle dorsiflexor and plantar flexor Pt apraxic, which may result in poor LE movement to command Sensory exam unable to assess due to fluent aphasia.  +word substitutions  Musculoskeletal: Full range of motion in all 4 extremities. No joint swelling   Assessment/Plan: 1. Functional deficits which require 3+ hours per day of interdisciplinary therapy in a comprehensive inpatient rehab setting.  Physiatrist is providing close team supervision and 24 hour management of active medical problems listed below.  Physiatrist and rehab team continue to assess  barriers to discharge/monitor patient progress toward functional and medical goals  Care Tool:  Bathing    Body parts bathed by patient: Right arm, Left arm, Chest, Abdomen, Face, Right upper leg, Left upper leg   Body parts bathed by helper: Front perineal area, Buttocks, Right lower leg, Left lower leg     Bathing assist Assist Level: Moderate Assistance - Patient 50 - 74%     Upper Body Dressing/Undressing Upper body dressing   What is the patient wearing?: Pull over shirt    Upper body assist Assist Level: Minimal Assistance - Patient > 75%    Lower Body Dressing/Undressing Lower body dressing      What is the patient wearing?: Pants, Incontinence brief     Lower body assist Assist for lower body dressing: Maximal Assistance - Patient 25 - 49% (Stedy)     Toileting Toileting    Toileting assist Assist for toileting: Maximal Assistance - Patient 25 - 49% (Stedy)     Transfers Chair/bed transfer  Transfers assist     Chair/bed transfer assist level: 2 Helpers (steady\)     Locomotion Ambulation   Ambulation assist   Ambulation activity did not occur: Safety/medical concerns          Walk 10 feet activity   Assist  Walk 10 feet activity did not occur: Safety/medical concerns        Walk 50 feet activity   Assist Walk 50 feet with 2 turns activity did  not occur: Safety/medical concerns         Walk 150 feet activity   Assist Walk 150 feet activity did not occur: Safety/medical concerns         Walk 10 feet on uneven surface  activity   Assist Walk 10 feet on uneven surfaces activity did not occur: Safety/medical concerns         Wheelchair     Assist Will patient use wheelchair at discharge?: Yes Type of Wheelchair: Manual    Wheelchair assist level: Total Assistance - Patient < 25% Max wheelchair distance: 150    Wheelchair 50 feet with 2 turns activity    Assist        Assist Level: Maximal  Assistance - Patient 25 - 49%   Wheelchair 150 feet activity     Assist      Assist Level: Total Assistance - Patient < 25%   Blood pressure 134/69, pulse 88, temperature 98.4 F (36.9 C), resp. rate 18, height 5\' 4"  (1.626 m), weight 82 kg, SpO2 95 %.  Medical Problem List and Plan: 1.  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  Antithrombotics: -DVT/anticoagulation: Bilateral lower extremity Dopplers negative for DVT.  Eliquis             -antiplatelet therapy: N/A 3. Pain Management: Tylenol as needed. Complains of neck discomfort: will order kpad 4. Mood: Provide emotional support             -antipsychotic agents: N/A 5. Neuropsych: This patient is capable of making decisions on her own behalf. 6. Skin/Wound Care: Routine skin checks Augmentin for cellulitis thru 11/18 7. Fluids/Electrolytes/Nutrition: Routine in and outs with follow-up chemistries 8.  Atrial fibrillation.  Cardizem 60 mg every 8 hours, Toprol-XL 100 mg daily.  Cardiac rate controlled to 80-90 on 11/15 9.  Diabetes mellitus.  Hemoglobin A1c 6.4.  SSI.  Patient on Glucophage 500 mg daily prior to admission.  Resume as needed CBG (last 3)  Recent Labs    05/17/20 1646 05/17/20 2039 05/18/20 0626  GLUCAP 100* 98 87  controlled 11/17  10.Hypertension.  Monitor with increased mobility.  Patient on lisinopril 40 mg daily prior to admission.  Resume as needed. Currently BP is soft.  Vitals:   05/18/20 0433 05/18/20 0624  BP: 119/75 134/69  Pulse: 88   Resp: 18   Temp: 98.4 F (36.9 C)   SpO2: 95%   controlled 11/17 11.  Hyperlipidemia.  Zetia 12.  Left lower extremity cellulitis.  Venous Doppler studies negative.  Complete course of  Augmentin initiated 05/15/2020.  13.  Hypo K not on diuretics, may be nutritional , supplement x 2 d a repeat BMETlow normal will cont KCL at 41meq per day 14.  Leukocytosis without fever improving, cont to monitor, may be resolving cellulitis   LOS: 3 days A FACE TO FACE EVALUATION WAS PERFORMED  Charlett Blake 05/18/2020, 9:02 AM

## 2020-05-18 NOTE — Progress Notes (Signed)
Speech Language Pathology Daily Session Note  Patient Details  Name: Sheila Ray MRN: [id number] Date of Birth: 01/24/1941  Today's Date: 05/18/2020 SLP Individual Time: 1415-1457 SLP Individual Time Calculation (min): 42 min  Short Term Goals: Week 1: SLP Short Term Goal 1 (Week 1): Pt will follow 2-step commands with 50% accuracy provided Max A multimodal cues. SLP Short Term Goal 2 (Week 1): Pt will demonstrate ability to problem solve functional situations with Mod A verbal/visual cues. SLP Short Term Goal 3 (Week 1): Pt will demonstrate recall of functional daily information and/or safety precautions with Mod A verbal/visual cues. SLP Short Term Goal 4 (Week 1): Pt will communicate at the phrase level to express basic wants and need with Mod A multimodal cues. SLP Short Term Goal 5 (Week 1): Pt will name produce phrase level utterances with Mod A multimodal cues for word finding strategies. SLP Short Term Goal 6 (Week 1): Pt will detect verbal and/or functional errors with Mod A multimodal cues.  Skilled Therapeutic Interventions: Pt was seen for skilled ST targeting communication goals. Pt demonstrated ability to use her basic communication board with 4/6 accuracy today, but independently able to read the words on it. She also demonstrated slight increase in ease of saying functional phrases such as "need bathroom" and "need food" without visual supports but Min A verbal cueing. SLP further facilitated session with picture description based tasks to maximize her verbal output. She answered questions about photograph scenes of basic to semi-complexity with overall Mod A semantic and sentence completion cues for word finding strategies. Her responses were mostly at the word and short phrase level. Pt then able to answer some questions about herself and hobbies related to the picture scenes used. Pt left laying in bed with alarm set and needs within reach. Continue per current plan of care.       Pain Pain Assessment Pain Scale: 0-10 Pain Score: 0-No pain  Therapy/Group: Individual Therapy  Arbutus Leas 05/18/2020, 3:01 PM

## 2020-05-19 ENCOUNTER — Encounter (HOSPITAL_COMMUNITY): Payer: Medicare Other | Admitting: Psychology

## 2020-05-19 ENCOUNTER — Inpatient Hospital Stay (HOSPITAL_COMMUNITY): Payer: Medicare Other | Admitting: Physical Therapy

## 2020-05-19 ENCOUNTER — Inpatient Hospital Stay (HOSPITAL_COMMUNITY): Payer: Medicare Other | Admitting: Occupational Therapy

## 2020-05-19 ENCOUNTER — Inpatient Hospital Stay (HOSPITAL_COMMUNITY): Payer: Medicare Other

## 2020-05-19 ENCOUNTER — Inpatient Hospital Stay (HOSPITAL_COMMUNITY): Payer: Medicare Other | Admitting: Speech Pathology

## 2020-05-19 LAB — GLUCOSE, CAPILLARY
Glucose-Capillary: 103 mg/dL — ABNORMAL HIGH (ref 70–99)
Glucose-Capillary: 115 mg/dL — ABNORMAL HIGH (ref 70–99)
Glucose-Capillary: 127 mg/dL — ABNORMAL HIGH (ref 70–99)
Glucose-Capillary: 109 mg/dL — ABNORMAL HIGH (ref 70–99)

## 2020-05-19 IMAGING — CR DG CHEST 2V
2 series · 2 of 2 positions shown · non-contrast
Comparison: [DATE]

CLINICAL DATA: Shortness of breath with chest pain 2 weeks.

EXAM:
CHEST - 2 VIEW

[chest lat]
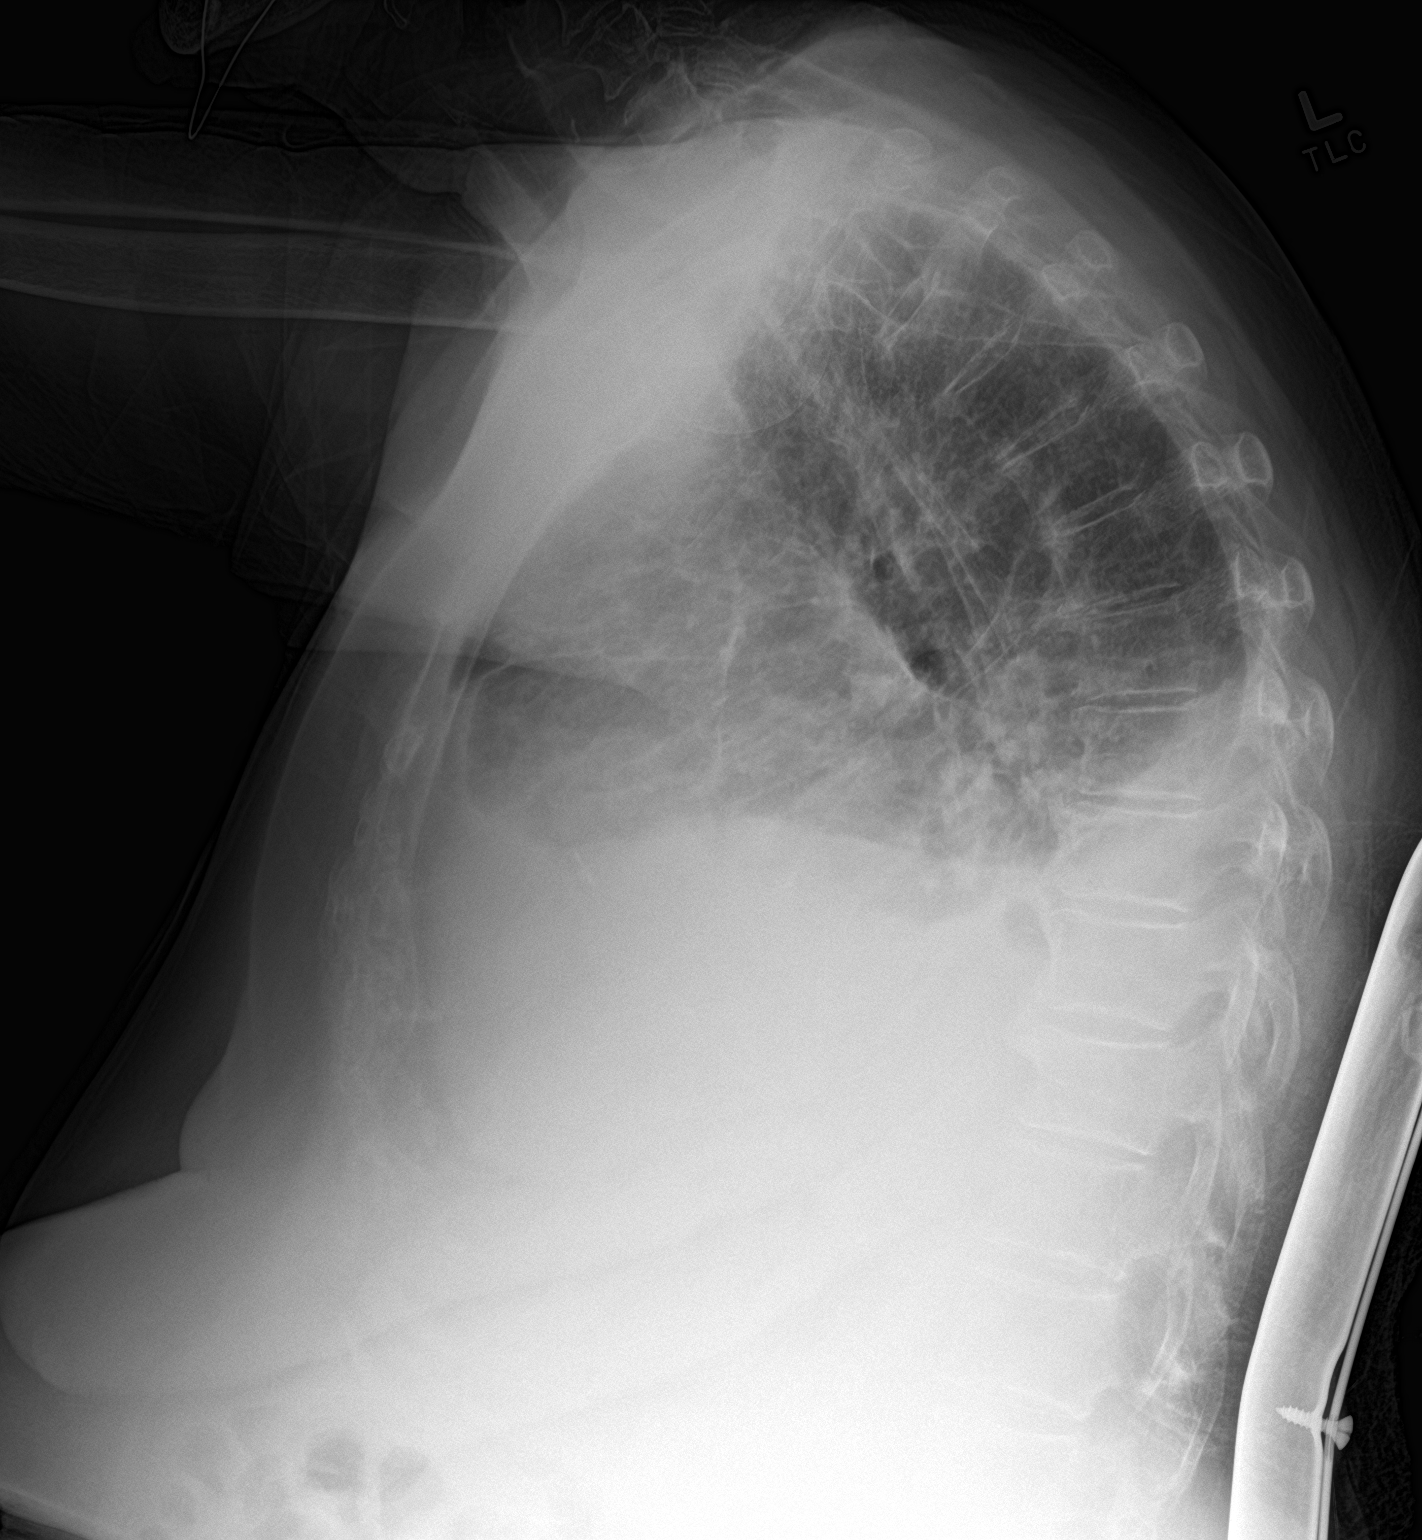

[chest ap]
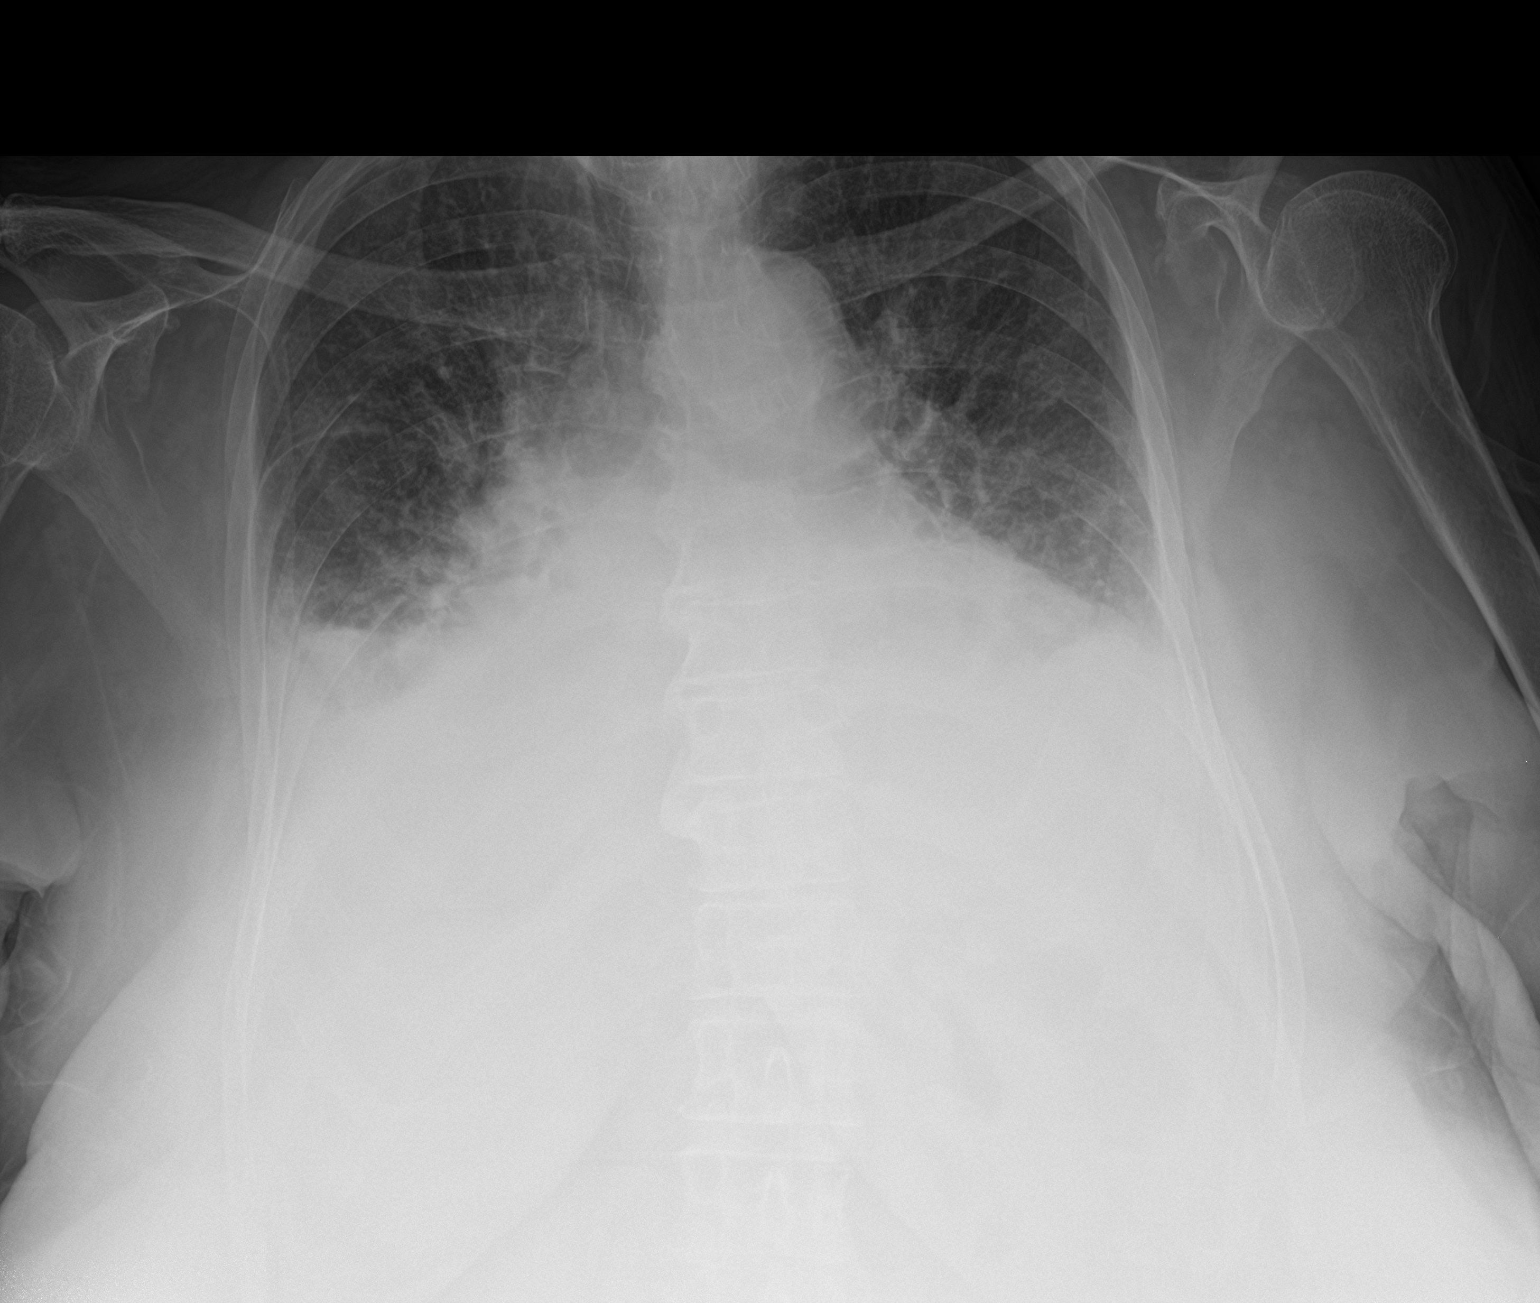

[2 of 2 positions shown; findings below may reference images not displayed]

FINDINGS: Examination demonstrates interval development of moderate size
bilateral pleural effusions with likely associated bibasilar
atelectasis. There is hazy prominence of the perihilar vessels
suggesting mild vascular congestion. Moderate stable cardiomegaly.
Remainder of the exam is unchanged.
IMPRESSION: 1. Interval development of moderate size bilateral pleural effusions
likely with associated bibasilar atelectasis.
2. Cardiomegaly with suggestion of mild vascular congestion.

## 2020-05-19 MED ORDER — POTASSIUM CHLORIDE CRYS ER 20 MEQ PO TBCR
20.0000 meq | EXTENDED_RELEASE_TABLET | Freq: Two times a day (BID) | ORAL | Status: DC
  Administered 2020-05-19 – 2020-05-20 (×3): 20 meq via ORAL
  Filled 2020-05-19 (×3): qty 1

## 2020-05-19 MED ORDER — FUROSEMIDE 40 MG PO TABS
40.0000 mg | ORAL_TABLET | Freq: Every day | ORAL | Status: AC
  Administered 2020-05-19 – 2020-05-20 (×2): 40 mg via ORAL
  Filled 2020-05-19 (×2): qty 1

## 2020-05-19 NOTE — Progress Notes (Signed)
Patient ID: Sheila Ray, female   DOB: [birth year], 79 y.o.   MRN: [id number]   Son will be bringing patient clothing this afternoon.  [place], [place]

## 2020-05-19 NOTE — Progress Notes (Signed)
Speech Language Pathology Daily Session Note  Patient Details  Name: Sheila Ray MRN: [id number] Date of Birth: [birth year]  Today's Date: 05/19/2020 SLP Individual Time: 4193-7902 SLP Individual Time Calculation (min): 56 min  Short Term Goals: Week 1: SLP Short Term Goal 1 (Week 1): Pt will follow 2-step commands with 50% accuracy provided Max A multimodal cues. SLP Short Term Goal 2 (Week 1): Pt will demonstrate ability to problem solve functional situations with Mod A verbal/visual cues. SLP Short Term Goal 3 (Week 1): Pt will demonstrate recall of functional daily information and/or safety precautions with Mod A verbal/visual cues. SLP Short Term Goal 4 (Week 1): Pt will communicate at the phrase level to express basic wants and need with Mod A multimodal cues. SLP Short Term Goal 5 (Week 1): Pt will name produce phrase level utterances with Mod A multimodal cues for word finding strategies. SLP Short Term Goal 6 (Week 1): Pt will detect verbal and/or functional errors with Mod A multimodal cues.  Skilled Therapeutic Interventions: Pt was seen for skilled ST targeting cognitive-linguistic goals. Upon arrival she expressed need to void by verbalizing "I might have pee". Therefore, SLP and NT assisted pt with transfer to toilet via stedy. During transfers she required overall Min A verbal cues for sequencing and safety awareness. Once safely back in chair, she engaged in a basic 3-step action card sequencing task with overall Min A verbal cues for error awareness and problem solving. She then described the steps of each sequence with overall Mod A phonemic, sentence completion, and written cues for word finding and correcting verbal errors in phrase and sentence level utterances. Pt sustained attention to tasks with overall Supervision A level verbal cues today. Pt left sitting in wheelchair with alarm set and needs within reach. Continue per current plan of care.          Pain Pain  Assessment Pain Scale: Faces Faces Pain Scale: No hurt  Therapy/Group: Individual Therapy  Arbutus Leas 05/19/2020, 2:59 PM

## 2020-05-19 NOTE — Progress Notes (Signed)
[facility] PHYSICAL MEDICINE & REHABILITATION PROGRESS NOTE   Subjective/Complaints: Discussed with neuropsych , recetive language fair, does well with simple info   ROS- limited by aphasia , indicates something with breathing Reviewed labs  Objective:   No results found. No results for input(s): WBC, HGB, HCT, PLT in the last 72 hours. Recent Labs    05/18/20 0525  NA 139  K 3.5  CL 112*  CO2 18*  GLUCOSE 96  BUN 5*  CREATININE 0.59  CALCIUM 8.6*    Intake/Output Summary (Last 24 hours) at 05/19/2020 0849 Last data filed at 05/19/2020 0135 Gross per 24 hour  Intake 480 ml  Output 1200 ml  Net -720 ml        Physical Exam: Vital Signs Blood pressure 138/85, pulse 88, temperature 97.6 F (36.4 C), temperature source Oral, resp. rate 19, height 5\' 4"  (1.626 m), weight 82 kg, SpO2 94 %.   General: No acute distress Mood and affect are appropriate Heart: Regular rate and rhythm no rubs murmurs or extra sounds Lungs: Clear to auscultation, breathing unlabored, no rales or wheezes Abdomen: Positive bowel sounds, soft nontender to palpation, nondistended Extremities: 2+ edema pretib Skin: No evidence of breakdown, no evidence of rash   Neurologic: Cranial nerves II through XII intact, motor strength is 5/5 in Left and 4/5 RIght deltoid, bicep, tricep, grip,3/5 R hip flexor, knee extensors, ankle dorsiflexor and plantar flexor Pt apraxic, which may result in poor LE movement to command Sensory exam unable to assess due to fluent aphasia.  +word substitutions  Musculoskeletal: Full range of motion in all 4 extremities. No joint swelling   Assessment/Plan: 1. Functional deficits which require 3+ hours per day of interdisciplinary therapy in a comprehensive inpatient rehab setting.  Physiatrist is providing close team supervision and 24 hour management of active medical problems listed below.  Physiatrist and rehab team continue to assess barriers to  discharge/monitor patient progress toward functional and medical goals  Care Tool:  Bathing    Body parts bathed by patient: Right arm, Left arm, Chest, Abdomen, Face, Right upper leg, Left upper leg   Body parts bathed by helper: Front perineal area, Buttocks, Right lower leg, Left lower leg     Bathing assist Assist Level: Moderate Assistance - Patient 50 - 74%     Upper Body Dressing/Undressing Upper body dressing   What is the patient wearing?: Hospital gown only    Upper body assist Assist Level: Minimal Assistance - Patient > 75%    Lower Body Dressing/Undressing Lower body dressing      What is the patient wearing?: Incontinence brief     Lower body assist Assist for lower body dressing: Moderate Assistance - Patient 50 - 74%     Toileting Toileting    Toileting assist Assist for toileting: Moderate Assistance - Patient 50 - 74%     Transfers Chair/bed transfer  Transfers assist     Chair/bed transfer assist level: 2 Helpers     Locomotion Ambulation   Ambulation assist   Ambulation activity did not occur: Safety/medical concerns          Walk 10 feet activity   Assist  Walk 10 feet activity did not occur: Safety/medical concerns        Walk 50 feet activity   Assist Walk 50 feet with 2 turns activity did not occur: Safety/medical concerns         Walk 150 feet activity   Assist Walk 150 feet activity  did not occur: Safety/medical concerns         Walk 10 feet on uneven surface  activity   Assist Walk 10 feet on uneven surfaces activity did not occur: Safety/medical concerns         Wheelchair     Assist Will patient use wheelchair at discharge?: Yes Type of Wheelchair: Manual    Wheelchair assist level: Total Assistance - Patient < 25% Max wheelchair distance: 150    Wheelchair 50 feet with 2 turns activity    Assist        Assist Level: Maximal Assistance - Patient 25 - 49%   Wheelchair 150  feet activity     Assist      Assist Level: Total Assistance - Patient < 25%   Blood pressure 138/85, pulse 88, temperature 97.6 F (36.4 C), temperature source Oral, resp. rate 19, height 5\' 4"  (1.626 m), weight 82 kg, SpO2 94 %.  Medical Problem List and Plan: 1.  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  Antithrombotics: -DVT/anticoagulation: Bilateral lower extremity Dopplers negative for DVT.  Eliquis             -antiplatelet therapy: N/A 3. Pain Management: Tylenol as needed. Complains of neck discomfort: will order kpad 4. Mood: Provide emotional support             -antipsychotic agents: N/A 5. Neuropsych: This patient is capable of making decisions on her own behalf. 6. Skin/Wound Care: Routine skin checks Augmentin for cellulitis thru 11/18 7. Fluids/Electrolytes/Nutrition: Routine in and outs with follow-up chemistries 8.  Atrial fibrillation.  Cardizem 60 mg every 8 hours, Toprol-XL 100 mg daily.  Cardiac rate controlled to 80-90 on 11/15 9.  Diabetes mellitus.  Hemoglobin A1c 6.4.  SSI.  Patient on Glucophage 500 mg daily prior to admission.  Resume as needed CBG (last 3)  Recent Labs    05/18/20 2109 05/18/20 2147 05/19/20 0550  GLUCAP 107* 108* 103*  controlled 11/19  10.Hypertension.  Monitor with increased mobility.  Patient on lisinopril 40 mg daily prior to admission.  Resume as needed. Currently BP is soft.  Vitals:   05/18/20 1931 05/19/20 0304  BP: 123/68 138/85  Pulse: 84 88  Resp: 17 19  Temp: 97.9 F (36.6 C) 97.6 F (36.4 C)  SpO2: 95% 94%  controlled 11/19 11.  Hyperlipidemia.  Zetia 12.  Left lower extremity cellulitis.  Venous Doppler studies negative.  Complete course of  Augmentin initiated 05/15/2020.  13.  Hypo K not on diuretics, may be nutritional , supplement x 2 d a repeat BMETlow normal will cont KCL at 74meq per day  14.  Leukocytosis without fever improving, cont to monitor, may be resolving cellulitis  15.  Mild diastolic dysfunction, mod /severe tricuspid regurg, increased LE edema, will check CXR    LOS: 4 days A FACE TO FACE EVALUATION WAS PERFORMED  Charlett Blake 05/19/2020, 8:49 AM

## 2020-05-19 NOTE — Consult Note (Signed)
Neuropsychological Consultation   Patient:   Sheila Ray   DOB:   11-02-1940  MR Number:  [id number]  Location:  [facility] [id number] [place] [address] Dept: [place]: [phone]           Date of Service:   05/19/2020  Start Time:   8 AM End Time:   9 AM  Provider/Observer:  Ilean Skill, Psy.D.       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  Patient presented on 05/03/2020 with right side weakness, facial droop, expressive aphasia and altered mental status.  CTA suggested left M2 MCA branch occlusion approximately 13 mm from trifurcation.  MRI showed cortical restricted diffusion within left temporal occipital region, scattered foci of restricted diffusion in left frontal, parietal and insular regions of the left amygdala.  Patient with ongoing aphasia, limited motor deficits and mild cog deficits in general.  Reason for Service:  Patient was referred for neuropsychological consultation due to ongoing cognitive deficits including aphasia.  Below is the HPI for the current admission.  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  Per chart review lives alone reportedly independent prior to admission and driving.  1 level home 3 steps to entry.  Presented 05/03/2020 with right side weakness facial droop expressive aphasia and altered mental status.  Her heart rate was noted to be in the 140s.  Cranial CT scan showed no acute intracranial hemorrhage no acute infarction.  CT  angiogram of head and neck occlusion of left M2 MCA branch approximate 13 mm from trifurcation.  No hemodynamically significant stenosis in the neck.  Patient underwent revascularization of occluded superior division M2 branch per interventional radiology.  MRI of the brain showed cortical restricted diffusion within the left temporal occipital region.  Scattered foci of restricted diffusion also seen in the left frontal parietal and insular regions of the left amygdala.  MRA with no proximal occlusion or stenosis.  EEG negative for seizure.  Echocardiogram with ejection fraction of 55 to 60% no wall motion abnormalities.  Neurology follow-up as well as cardiology services her Xarelto was changed to Eliquis.  She did remain on Cardizem as well as metoprolol for her atrial fibrillation.  Tolerating a regular diet.  Patient with noted leukocytosis 19,400-24,100 05/13/2020 as well as BUN 29 and creatinine 1.92.  Lactic acid within normal limits.  Renal ultrasound without hydronephrosis.  Chest x-ray showed persistent but improving CHF superimposed infection cannot be ruled out.  Urinalysis negative nitrite few bacteria.  Leukocytosis felt likely from underlying left lower extremity cellulitis and placed on Augmentin 05/15/2020 with WBC improving to 16,000.  Therapy evaluations completed and patient was admitted for a comprehensive rehab program.  Current Status:  Patient was awake and alert when entering room.  She was watching TV and appeared to be able to comprehend what was going on with the news broadcast.  She had eaten very little of her breakfast but reported that she does not eat breakfast most mornings besides coffee.  The patient with significant expressive aphasia demonstrating semantic paraphasia, jargon, circumlocution, and conduit d'approche.  Patient appears to have retained receptive language abilities.  She showed mild impairments with orientation but was generally aware of situation, location there  was some degree of circumstantial and tangential thought.  Patient denied significant mood disturbance, although she was very concerned that she had gotten her cloths wet and was wearing a gown and had not other cloths.   I did not see other cloths in room and patient was requesting to get family to bring more.  Patient did not ambulate while I was there beyond moving arms but motor function did appear adequate but weak for arms.    Behavioral Observation: Sheila Ray  presents as a 79 y.o.-year-old Right Caucasian Female who appeared her stated age. her dress was Appropriate and she was Fairly Groomed and her manners were Appropriate to the situation.  her participation was indicative of Inattentive and Redirectable behaviors.  There were any physical disabilities noted.  she displayed an appropriate level of cooperation and motivation.     Interactions:    Active Inattentive and Redirectable  Attention:   abnormal and attention span appeared shorter than expected for age  Memory:   within normal limits; recent and remote memory intact  Visuo-spatial:  not examined  Speech (Volume):  normal  Speech:   non-fluent aphasia;semantic paraphasia, jargon, circumlocution, and conduit d'approche errors  Thought Process:  Circumstantial and Tangential  Though Content:  WNL; not suicidal and not homicidal  Orientation:   person, place and situation  Judgment:   Fair  Planning:   Poor  Affect:    Appropriate  Mood:    Euthymic  Insight:   Fair  Intelligence:   normal  Medical History:   Past Medical History:  Diagnosis Date  . Arthritis   . Atrial fibrillation ([place])   . Complication of anesthesia   . Diabetes mellitus without complication ([place])   . Diverticulosis   . Fatigue   . Gastric polyp   . Goiter   . Hyperlipidemia   . Hypertension   . Hypothyroid    taken off of thyroid medication 2012014   . Obese   . Osteopenia   . PONV (postoperative nausea and vomiting)   .  Postmenopausal   . Rosacea   . Stroke ([place]) 01/15/2013   left thalamic  stroke, small vessel  . Vitamin D deficiency    Psychiatric History:  No prior psychiatric history  Family Med/Psych History:  Family History  Problem Relation Age of Onset  . Osteoporosis Mother   . Alzheimer's disease Mother 61  . Arthritis Mother   . Cancer Father        Lung  . Arthritis Sister   . Obesity Sister   . Heart attack Brother 58  . Heart disease Son   . Arthritis Brother   . Arthritis Sister   . 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  Patient presented on 05/03/2020 with right side weakness, facial droop, expressive aphasia and altered mental status.  CTA suggested left M2 MCA branch occlusion approximately 13 mm from trifurcation.  MRI showed cortical restricted diffusion within left temporal occipital region, scattered foci of restricted diffusion in left frontal, parietal and insular regions of the left amygdala.  Patient with ongoing aphasia, limited motor deficits and mild cog deficits in general.  Patient  was awake and alert when entering room.  She was watching TV and appeared to be able to comprehend what was going on with the news broadcast.  She had eaten very little of her breakfast but reported that she does not eat breakfast most mornings besides coffee.  The patient with significant expressive aphasia demonstrating semantic paraphasia, jargon, circumlocution, and conduit d'approche.  Patient appears to have retained receptive language abilities.  She showed mild impairments with orientation but was generally aware of situation, location there was some degree of circumstantial and tangential thought.  Patient denied significant mood disturbance, although she was very concerned that she had gotten her  cloths wet and was wearing a gown and had not other cloths.   I did not see other cloths in room and patient was requesting to get family to bring more.  Patient did not ambulate while I was there beyond moving arms but motor function did appear adequate but weak for arms.   Disposition/Plan:  Patient appeared to be coping emotionally ok with current status but frustration with reduced expressive language abilities is clear.  Patient will need continued work with Speech Therapy.  Will follow-up with patient if needed.    Diagnosis:    Left middle cerebral artery stroke ([place]) - Plan: Ambulatory referral to Neurology  Hyperglycemia - Plan: insulin aspart (novoLOG) injection 0-6 Units         Electronically Signed   _______________________ Ilean Skill, Psy.D.

## 2020-05-19 NOTE — Progress Notes (Signed)
Patient ID: Sheila Ray, female   DOB: Feb 19, 1941, 79 y.o.   MRN: [id number]   SW made attempt to call son to request clothes for patient. No answer, will wait for return phone call.  [place], [place]

## 2020-05-19 NOTE — Progress Notes (Signed)
Physical Therapy Session Note  Patient Details  Name: Sheila Ray MRN: [id number] Date of Birth: 1940/10/05  Today's Date: 05/19/2020 PT Individual Time: [phone]   79  Short Term Goals: Week 1:  PT Short Term Goal 1 (Week 1): pt to demonstrate min A for supine<>sit PT Short Term Goal 2 (Week 1): pt to demonstrate mod A functional transfers with LRAD PT Short Term Goal 3 (Week 1): pt to demonstrate 54' with WC propulsion min A PT Short Term Goal 4 (Week 1): pt demonstrate static standing balance min A PT Short Term Goal 5 (Week 1): iniate gait  Skilled Therapeutic Interventions/Progress Updates:   Pt received supine in bed and agreeable to PT. Supine>sit transfer with mod assist for RLE, and trunk management to initiate movement to EOB.   Upper and lower body dressing EOB with max assist  For lower body for time management. Pt able to orient shirt and don with increased time and set up only.   Stand pivot transfer to [facility] with mod assist to advance the RLE and improve lateral weight shift L. Pt performed hair care and oral hygiene at sink with set up assist from PT and increased time.   Pt transported to day room in [facility]. Pt performed sit<>stand from [facility] with mod assist x 8 throughout treatment with mod cues for LE and UE placement. Lateral reach to the L then bean bag toss to target with RUE.   Gait training x 85f with visal target for each step of dots on floor. Pt able to advance the RLE 50% of steps win min assist and then required mod assist as motor planning decreased. [facility] and all needs met.            Therapy Documentation Precautions:  Precautions Precautions: Fall Restrictions Weight Bearing Restrictions: No    Pain: Pain Assessment Pain Scale: 0-10 Pain Score: 0-No pain Pain Type: Acute pain Pain Location: Head Pain Orientation: Left;Anterior Pain Descriptors / Indicators: Aching Pain  Frequency: Intermittent Pain Onset: On-going Pain Intervention(s): Medication (See eMAR) Multiple Pain Sites: No    Therapy/Group: Individual Therapy  ALorie Phenix11/19/2021, 10:02 AM

## 2020-05-19 NOTE — Progress Notes (Signed)
Occupational Therapy Session Note  Patient Details  Name: Sheila Ray MRN: [id number] Date of Birth: 1941/01/29  Today's Date: 05/19/2020 OT Individual Time: 4471-5806 OT Individual Time Calculation (min): 45 min    Short Term Goals: Week 1:  OT Short Term Goal 1 (Week 1): patient will complete functional sit pivot or stand pivot transfers with mod A of one OT Short Term Goal 2 (Week 1): patient will complete UB bathing dressing with min A using right UE atleast 50% OT Short Term Goal 3 (Week 1): patient will complete LB bathing/dressing with mod A OT Short Term Goal 4 (Week 1): patient will complete 2/3 toileting components with mod A  Skilled Therapeutic Interventions/Progress Updates:  Patient met seated in wc in agreement with OT treatment session. Patient reporting mild chronic low back pain. Premedicated prior to OT entry. Patient declining ADL needs this session. Total A for wc transport to dayroom for time management and energy conservation. Sit to stand x5 trials from wc to RW with cues for hand placement and R attention. Noted deficits in motor planning, initiation, and attention throughout tx session. Patient also very ataxic. Squat-pivot transfer <> mat table with multimodal cues for hand placement and R attention. Static/dynamic sitting balance in prep for ADLs and ADL transfers with focus on anterior leans and scooting R<>L. OT provided education on RUE AROM to increase independence with ADLs. Written HEP to be provided at subsequent OT treatment session. Patient reporting fatigue with request to return to room. Attempted wc mobility with education on forward propulsion but environment in hallway was too distracting for patient. Total A for return to room. Hand hygiene seated in wc in prep for lunch with cues for sequencing 2/2 motor planning deficits. Session ended with patient seated in wc with patient seated in wc with call bell within reach, belt alarm activated, and all needs  met.   Therapy Documentation Precautions:  Precautions Precautions: Fall Restrictions Weight Bearing Restrictions: No General: General OT Amount of Missed Time: 30 Minutes  Therapy/Group: Individual Therapy  Niasia Lanphear R [place] 05/19/2020, 7:31 AM

## 2020-05-20 ENCOUNTER — Inpatient Hospital Stay (HOSPITAL_COMMUNITY): Payer: Medicare Other | Admitting: Physical Therapy

## 2020-05-20 ENCOUNTER — Inpatient Hospital Stay (HOSPITAL_COMMUNITY): Payer: Medicare Other

## 2020-05-20 ENCOUNTER — Inpatient Hospital Stay (HOSPITAL_COMMUNITY): Payer: Medicare Other | Admitting: Speech Pathology

## 2020-05-20 LAB — GLUCOSE, CAPILLARY
Glucose-Capillary: 97 mg/dL (ref 70–99)
Glucose-Capillary: 100 mg/dL — ABNORMAL HIGH (ref 70–99)
Glucose-Capillary: 119 mg/dL — ABNORMAL HIGH (ref 70–99)
Glucose-Capillary: 127 mg/dL — ABNORMAL HIGH (ref 70–99)

## 2020-05-20 LAB — BASIC METABOLIC PANEL
Anion gap: 9 (ref 5–15)
BUN: <5 mg/dL — ABNORMAL LOW (ref 8–23)
CO2: 22 mmol/L (ref 22–32)
Calcium: 8.8 mg/dL — ABNORMAL LOW (ref 8.9–10.3)
Chloride: 109 mmol/L (ref 98–111)
Creatinine, Ser: 0.62 mg/dL (ref 0.44–1.00)
GFR, Estimated: >60 mL/min (ref >60)
Glucose, Bld: 116 mg/dL — ABNORMAL HIGH (ref 70–99)
Potassium: 3.1 mmol/L — ABNORMAL LOW (ref 3.5–5.1)
Sodium: 140 mmol/L (ref 135–145)

## 2020-05-20 MED ORDER — POTASSIUM CHLORIDE CRYS ER 10 MEQ PO TBCR
30.0000 meq | EXTENDED_RELEASE_TABLET | Freq: Two times a day (BID) | ORAL | Status: DC
Start: 2020-05-20 — End: 2020-05-20

## 2020-05-20 MED ORDER — POTASSIUM CHLORIDE CRYS ER 20 MEQ PO TBCR
30.0000 meq | EXTENDED_RELEASE_TABLET | Freq: Two times a day (BID) | ORAL | Status: AC
  Administered 2020-05-20 – 2020-05-21 (×3): 30 meq via ORAL
  Filled 2020-05-20 (×3): qty 1

## 2020-05-20 NOTE — Progress Notes (Signed)
Speech Language Pathology Daily Session Note  Patient Details  Name: ZENYA HICKAM MRN: [id number] Date of Birth: 30-Apr-1941  Today's Date: 05/20/2020 SLP Individual Time: 0930-1000 SLP Individual Time Calculation (min): 30 min  Short Term Goals: Week 1: SLP Short Term Goal 1 (Week 1): Pt will follow 2-step commands with 50% accuracy provided Max A multimodal cues. SLP Short Term Goal 2 (Week 1): Pt will demonstrate ability to problem solve functional situations with Mod A verbal/visual cues. SLP Short Term Goal 3 (Week 1): Pt will demonstrate recall of functional daily information and/or safety precautions with Mod A verbal/visual cues. SLP Short Term Goal 4 (Week 1): Pt will communicate at the phrase level to express basic wants and need with Mod A multimodal cues. SLP Short Term Goal 5 (Week 1): Pt will name produce phrase level utterances with Mod A multimodal cues for word finding strategies. SLP Short Term Goal 6 (Week 1): Pt will detect verbal and/or functional errors with Mod A multimodal cues.  Skilled Therapeutic Interventions:   Patient seen for skilled ST session focusing primarily on language goals. Patient did state she was in "[facility]" and when asked city said "[place]". She was able to state "I fell and had a stroke", when asked what happened to her. During task of describing verb/action photos, she exhibited phonemic and semantic paraphasias with some perseveration on previous word, ie: after first picture of person blowing up a balloon, next picture was man peeling orange and she said, "peeling a balloon". She exhibited awareness to phonemic paraphasias, "paling, no, PEELING" but not semantic paraphasias unless SLP brought her attention to them. When describing incorrect actions in photographs (man cutting bread with scissors, etc) "You dont eat bread like that". She required modA cues to first identify objects and actions in photo, then cues to describe what  mistake was. Patient appeared very receptive to and engaged in language tasks and her awareness and attempts to correct self improved during session. She continues to benefit from skilled SLP intervention to maximize speech-language and cognitive function prior to discharge.  Pain Pain Assessment Pain Scale: 0-10 Pain Score: 0-No pain  Therapy/Group: Individual Therapy  Sonia Baller, MA, CCC-SLP Speech Therapy

## 2020-05-20 NOTE — Progress Notes (Signed)
Physical Therapy Session Note  Patient Details  Name: Sheila Ray MRN: [id number] Date of Birth: [birth year]  Today's Date: 05/20/2020 PT Individual Time: 1050-1200 and 1445-1538 PT Individual Time Calculation (min): 70 min 53 min   Short Term Goals: Week 1:  PT Short Term Goal 1 (Week 1): pt to demonstrate min A for supine<>sit PT Short Term Goal 2 (Week 1): pt to demonstrate mod A functional transfers with LRAD PT Short Term Goal 3 (Week 1): pt to demonstrate 38' with WC propulsion min A PT Short Term Goal 4 (Week 1): pt demonstrate static standing balance min A PT Short Term Goal 5 (Week 1): iniate gait  Skilled Therapeutic Interventions/Progress Updates:  Session 1  Pt received supine in bed and agreeable to PT. Supine>sit transfer with mod assist and cues for sequencing and use of RUE.   Stand pivot transfer to [facility] with mod assist and max cues for improved placement of the RLE.   WC mobility through hall x 18f with min-mod assist and max cues for sustain attention to task.   Gait training with visual feedback from mirror and visual input on floor 170fx 3. Pt performed stepping on dots for 2 bouts and then advancing the RLE to try to kick band between front legs of RW. Pt demonstrated ability to advance the RLE 75% of step without added assist from PT on this day.   nustep reciprocal/endurance movement training x 5 min +3 min with cues for full ORM on the R side as well as improved RLE hip neutral hip ER/IR.   [facility] and all needs met.    session 2.    Pt received supine in bed and agreeable to PT. Supine>sit transfer with min assist for improved RLE position and then heavy use of bed rails to push in to sitting from semireclined.   [facility] and RW with moderate cues to improve step length on the RLE to kick target on RW; PT required to pull WC behind pt due to poor  sequencing of RLE.   Pt transported to day room. Stand pivot transfer to mat table on the L with mod assist and max cues for sequencing as well as manual facilitation for hip/knee flexion to perform posterior step with the RLE.   Pt performed trunkal seated NMR to diagonals x 10 Bil with beach ball to reach to target. Chest press, overhead press with 2# bar weight x 10 with cues for proper trunkal extension to improve ROM. Cross body reachs to obtain clothes pint then place on horizontal bar with lateral reach x 10 BUE cues for improved trunkal rotation and attention to the RUE.   Pt reports need for BM.  Stedy transfer to toilet with min assist. PT performed pericare with max assist while pt standing in stedy. Pt returned to room and performed stedy transfer to bed with minassist. Sit>supine completed with min assist for control of the RLE, and left supine in bed with call bell in reach and all needs met.        Therapy Documentation Precautions:  Precautions Precautions: Fall Restrictions Weight Bearing Restrictions: No   Vital Signs: Therapy Vitals Temp: 97.7 F (36.5 C) Pulse Rate: 78 Resp: 20 BP: 119/69 Patient Position (if appropriate): Lying Oxygen Therapy SpO2: 99 % O2 Device: Room Air Pain: denies  Therapy/Group: Individual Therapy  AuLorie Phenix1/20/2021,  4:00 PM

## 2020-05-20 NOTE — Progress Notes (Signed)
[place] PHYSICAL MEDICINE & REHABILITATION PROGRESS NOTE   Subjective/Complaints: Patient complains of feeling cold but otherwise no complaints.  Pleural effusions, receiving p.o. Lasix,  ROS- limited by aphasia , indicates something with breathing Reviewed labs  Objective:   DG Chest 2 View  Result Date: 05/19/2020 CLINICAL DATA:  Shortness of breath with chest pain 2 weeks. EXAM: CHEST - 2 VIEW COMPARISON:  05/12/2020 FINDINGS: Examination demonstrates interval development of moderate size bilateral pleural effusions with likely associated bibasilar atelectasis. There is hazy prominence of the perihilar vessels suggesting mild vascular congestion. Moderate stable cardiomegaly. Remainder of the exam is unchanged. IMPRESSION: 1. Interval development of moderate size bilateral pleural effusions likely with associated bibasilar atelectasis. 2. Cardiomegaly with suggestion of mild vascular congestion. Electronically Signed   By: Marin Olp M.D.   On: 05/19/2020 14:27   No results for input(s): WBC, HGB, HCT, PLT in the last 72 hours. Recent Labs    05/18/20 0525 05/20/20 0447  NA 139 140  K 3.5 3.1*  CL 112* 109  CO2 18* 22  GLUCOSE 96 116*  BUN 5* <5*  CREATININE 0.59 0.62  CALCIUM 8.6* 8.8*    Intake/Output Summary (Last 24 hours) at 05/20/2020 1410 Last data filed at 05/20/2020 1240 Gross per 24 hour  Intake 420 ml  Output 4275 ml  Net -3855 ml        Physical Exam: Vital Signs Blood pressure 119/69, pulse 78, temperature 97.7 F (36.5 C), resp. rate 20, height 5\' 4"  (1.626 m), weight 82 kg, SpO2 99 %.    General: No acute distress Mood and affect are appropriate Heart: Regular rate and rhythm no rubs murmurs or extra sounds Lungs: Clear to auscultation, breathing unlabored, no rales or wheezes Abdomen: Positive bowel sounds, soft nontender to palpation, nondistended Extremities: No clubbing, cyanosis, or edema   Extremities: 2+ edema pretib Skin: No  evidence of breakdown, no evidence of rash   Neurologic: Cranial nerves II through XII intact, motor strength is 5/5 in Left and 4/5 RIght deltoid, bicep, tricep, grip,3/5 R hip flexor, knee extensors, ankle dorsiflexor and plantar flexor Pt apraxic, which may result in poor LE movement to command Sensory exam unable to assess due to fluent aphasia.  +word substitutions  Musculoskeletal: Full range of motion in all 4 extremities. No joint swelling   Assessment/Plan: 1. Functional deficits which require 3+ hours per day of interdisciplinary therapy in a comprehensive inpatient rehab setting.  Physiatrist is providing close team supervision and 24 hour management of active medical problems listed below.  Physiatrist and rehab team continue to assess barriers to discharge/monitor patient progress toward functional and medical goals  Care Tool:  Bathing    Body parts bathed by patient: Right arm, Left arm, Chest, Abdomen, Face, Right upper leg, Left upper leg   Body parts bathed by helper: Front perineal area, Buttocks, Right lower leg, Left lower leg     Bathing assist Assist Level: Moderate Assistance - Patient 50 - 74%     Upper Body Dressing/Undressing Upper body dressing   What is the patient wearing?: Button up shirt    Upper body assist Assist Level: Minimal Assistance - Patient > 75%    Lower Body Dressing/Undressing Lower body dressing      What is the patient wearing?: Incontinence brief, Pants     Lower body assist Assist for lower body dressing: Maximal Assistance - Patient 25 - 49%     Toileting Toileting    Toileting assist Assist  for toileting: Maximal Assistance - Patient 25 - 49%     Transfers Chair/bed transfer  Transfers assist     Chair/bed transfer assist level: Moderate Assistance - Patient 50 - 74%     Locomotion Ambulation   Ambulation assist   Ambulation activity did not occur: Safety/medical concerns          Walk 10 feet  activity   Assist  Walk 10 feet activity did not occur: Safety/medical concerns        Walk 50 feet activity   Assist Walk 50 feet with 2 turns activity did not occur: Safety/medical concerns         Walk 150 feet activity   Assist Walk 150 feet activity did not occur: Safety/medical concerns         Walk 10 feet on uneven surface  activity   Assist Walk 10 feet on uneven surfaces activity did not occur: Safety/medical concerns         Wheelchair     Assist Will patient use wheelchair at discharge?: Yes Type of Wheelchair: Manual    Wheelchair assist level: Total Assistance - Patient < 25% Max wheelchair distance: 150    Wheelchair 50 feet with 2 turns activity    Assist        Assist Level: Maximal Assistance - Patient 25 - 49%   Wheelchair 150 feet activity     Assist      Assist Level: Total Assistance - Patient < 25%   Blood pressure 119/69, pulse 78, temperature 97.7 F (36.5 C), resp. rate 20, height 5\' 4"  (1.626 m), weight 82 kg, SpO2 99 %.  Medical Problem List and Plan: 1.  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  Antithrombotics: -DVT/anticoagulation: Bilateral lower extremity Dopplers negative for DVT.  Eliquis             -antiplatelet therapy: N/A 3. Pain Management: Tylenol as needed. Complains of neck discomfort: will order kpad 4. Mood: Provide emotional support             -antipsychotic agents: N/A 5. Neuropsych: This patient is capable of making decisions on her own behalf. 6. Skin/Wound Care: Routine skin checks Augmentin for cellulitis thru 11/18 7. Fluids/Electrolytes/Nutrition: Routine in and outs with follow-up chemistries 8.  Atrial  fibrillation.  Cardizem 60 mg every 8 hours, Toprol-XL 100 mg daily.  Cardiac rate controlled to 80-90 on 11/15 9.  Diabetes mellitus.  Hemoglobin A1c 6.4.  SSI.  Patient on Glucophage 500 mg daily prior to admission.  Resume as needed CBG (last 3)  Recent Labs    05/19/20 2105 05/20/20 0609 05/20/20 1203  GLUCAP 109* 97 100*  controlled 11/19  10.Hypertension.  Monitor with increased mobility.  Patient on lisinopril 40 mg daily prior to admission.  Resume as needed. Currently BP is soft.  Vitals:   05/20/20 0541 05/20/20 1409  BP: 121/63 119/69  Pulse:  78  Resp:  20  Temp:  97.7 F (36.5 C)  SpO2:  99%  controlled 11/19 11.  Hyperlipidemia.  Zetia 12.  Left lower extremity cellulitis.  Venous Doppler studies negative.  Complete course of Augmentin initiated 05/15/2020.  13.  Hypo K not on diuretics, may be nutritional , supplement x 2 d a repeat BMETlow normal will cont KCL at 72meq per day will increase supplementation due to potassium of 3.0 14.  Leukocytosis without fever improving, cont to monitor, may be resolving cellulitis  15.  Mild diastolic dysfunction, mod /severe tricuspid regurg, increased LE edema, chest x-ray shows pleural effusions    LOS: 5 days A FACE TO FACE EVALUATION WAS PERFORMED  Charlett Blake 05/20/2020, 2:10 PM

## 2020-05-20 NOTE — Progress Notes (Signed)
Occupational Therapy Session Note  Patient Details  Name: Sheila Ray MRN: [id number] Date of Birth: 1940-08-21  Today's Date: 05/20/2020 OT Individual Time: 6160-7371 OT Individual Time Calculation (min): 75 min    Short Term Goals: Week 1:  OT Short Term Goal 1 (Week 1): patient will complete functional sit pivot or stand pivot transfers with mod A of one OT Short Term Goal 2 (Week 1): patient will complete UB bathing dressing with min A using right UE atleast 50% OT Short Term Goal 3 (Week 1): patient will complete LB bathing/dressing with mod A OT Short Term Goal 4 (Week 1): patient will complete 2/3 toileting components with mod A  Skilled Therapeutic Interventions/Progress Updates:    OT session focused on ADL retraining, functional transfers, activity tolerance, and direction following. Pt received asleep, requiring increased time to wake up. Transitioned to sitting EOB with mod A then sat EOB ~5 min eating breakfast. Pt was SBA for sitting balance, showing R lean at times however able to correct with min cues. Completed squat pivot transfers bed<>w/c with heavy mod A and fair direction following/sequencing. Pt completed stand pivot transfer w/c<>toilet with mod A and min cues for sequencing. Pt completed sit<>stand 5x during toileting/dressing with min A and remained in standing ~30 seconds each. Pt required max A toileting and LB dressing. At end of session, pt requesting to return to bed, therefore left with all needs in reach and bed alarm on.   Therapy Documentation Precautions:  Precautions Precautions: Fall Restrictions Weight Bearing Restrictions: No General:   Vital Signs: Therapy Vitals Temp: (!) 97.3 F (36.3 C) Pulse Rate: 80 Resp: 20 BP: 121/63 Patient Position (if appropriate): Lying Oxygen Therapy SpO2: 98 % O2 Device: Room Air Pain:   ADL: ADL Grooming: Minimal assistance Where Assessed-Grooming: Sitting at sink, Wheelchair Upper Body Bathing:  Moderate assistance Where Assessed-Upper Body Bathing: Shower Lower Body Bathing: Dependent Where Assessed-Lower Body Bathing: Shower Upper Body Dressing: Moderate assistance Where Assessed-Upper Body Dressing: Wheelchair Lower Body Dressing: Dependent Where Assessed-Lower Body Dressing: Wheelchair Toileting: Dependent Where Assessed-Toileting: Glass blower/designer: Dependent, Maximal assistance, Other (comment) (via stedy) Toilet Transfer Method: Other (comment) (via stedy) Science writer: Raised toilet seat Social research officer, government: Maximal assistance, Dependent Social research officer, government Method: Other (comment) (via stedy) Youth worker: Nurse, learning disability    Praxis   Exercises:   Other Treatments:     Therapy/Group: Individual Therapy  Duayne Cal 05/20/2020, 8:08 AM

## 2020-05-20 NOTE — Plan of Care (Signed)
  Problem: Consults Goal: RH STROKE PATIENT EDUCATION Description: See Patient Education module for education specifics  Outcome: Progressing   Problem: RH BOWEL ELIMINATION Goal: RH STG MANAGE BOWEL WITH ASSISTANCE Description: STG Manage Bowel with Assistance. Min Outcome: Progressing   Problem: RH SAFETY Goal: RH STG ADHERE TO SAFETY PRECAUTIONS W/ASSISTANCE/DEVICE Description: STG Adhere to Safety Precautions With Assistance/Device. Min Outcome: Progressing   Problem: RH COGNITION-NURSING Goal: RH STG USES MEMORY AIDS/STRATEGIES W/ASSIST TO PROBLEM SOLVE Description: STG Uses Memory Aids/Strategies With Assistance to Problem Solve. Mod Outcome: Progressing   Problem: RH KNOWLEDGE DEFICIT Goal: RH STG INCREASE KNOWLEDGE OF STROKE PROPHYLAXIS Description: Patient/caregiver will be able to describe plan to prevent further stroke including medications with cues/handouts Outcome: Progressing   Problem: RH BLADDER ELIMINATION Goal: RH STG MANAGE BLADDER WITH ASSISTANCE Description: STG Manage Bladder With Assistance Min Outcome: Not Progressing

## 2020-05-21 LAB — BASIC METABOLIC PANEL
Anion gap: 9 (ref 5–15)
BUN: <5 mg/dL — ABNORMAL LOW (ref 8–23)
CO2: 27 mmol/L (ref 22–32)
Calcium: 8.7 mg/dL — ABNORMAL LOW (ref 8.9–10.3)
Chloride: 103 mmol/L (ref 98–111)
Creatinine, Ser: 0.67 mg/dL (ref 0.44–1.00)
GFR, Estimated: >60 mL/min (ref >60)
Glucose, Bld: 109 mg/dL — ABNORMAL HIGH (ref 70–99)
Potassium: 3.1 mmol/L — ABNORMAL LOW (ref 3.5–5.1)
Sodium: 139 mmol/L (ref 135–145)

## 2020-05-21 LAB — GLUCOSE, CAPILLARY
Glucose-Capillary: 100 mg/dL — ABNORMAL HIGH (ref 70–99)
Glucose-Capillary: 94 mg/dL (ref 70–99)
Glucose-Capillary: 122 mg/dL — ABNORMAL HIGH (ref 70–99)
Glucose-Capillary: 135 mg/dL — ABNORMAL HIGH (ref 70–99)

## 2020-05-21 NOTE — Plan of Care (Signed)
  Problem: RH BLADDER ELIMINATION Goal: RH STG MANAGE BLADDER WITH ASSISTANCE Description: STG Manage Bladder With Assistance Min Outcome: Not Progressing; in and out cath

## 2020-05-22 ENCOUNTER — Inpatient Hospital Stay (HOSPITAL_COMMUNITY): Payer: Medicare Other | Admitting: Occupational Therapy

## 2020-05-22 ENCOUNTER — Inpatient Hospital Stay (HOSPITAL_COMMUNITY): Payer: Medicare Other | Admitting: Speech Pathology

## 2020-05-22 ENCOUNTER — Inpatient Hospital Stay (HOSPITAL_COMMUNITY): Payer: Medicare Other | Admitting: Physical Therapy

## 2020-05-22 LAB — GLUCOSE, CAPILLARY
Glucose-Capillary: 104 mg/dL — ABNORMAL HIGH (ref 70–99)
Glucose-Capillary: 105 mg/dL — ABNORMAL HIGH (ref 70–99)
Glucose-Capillary: 103 mg/dL — ABNORMAL HIGH (ref 70–99)
Glucose-Capillary: 109 mg/dL — ABNORMAL HIGH (ref 70–99)
Glucose-Capillary: 160 mg/dL — ABNORMAL HIGH (ref 70–99)

## 2020-05-22 MED ORDER — SORBITOL 70 % SOLN
30.0000 mL | Freq: Once | Status: AC
  Administered 2020-05-22: 30 mL via ORAL
  Filled 2020-05-22: qty 30

## 2020-05-22 NOTE — Progress Notes (Signed)
Occupational Therapy Session Note  Patient Details  Name: Sheila Ray MRN: [id number] Date of Birth: February 16, 1941  Today's Date: 05/22/2020 OT Individual Time: 7076-1518 OT Individual Time Calculation (min): 59 min    Short Term Goals: Week 1:  OT Short Term Goal 1 (Week 1): patient will complete functional sit pivot or stand pivot transfers with mod A of one OT Short Term Goal 2 (Week 1): patient will complete UB bathing dressing with min A using right UE atleast 50% OT Short Term Goal 3 (Week 1): patient will complete LB bathing/dressing with mod A OT Short Term Goal 4 (Week 1): patient will complete 2/3 toileting components with mod A  Skilled Therapeutic Interventions/Progress Updates:  Patient met lying supine in bed in agreement with OT treatment session. 0/10 pain at rest and with activity. Supine to EOB with assist at trunk and slight assist to advance RLE toward EOB. Mod A for anterior scoot toward EOB with use of chuck pad. Patient completed squat-pivot transfer to wc on L with Max A and multimodal cues for R attention and sequencing. Total A for wc transport to commode in bathroom. Squat-pivot transfer to [facility] with heavy Max A and maximal multimodal cues. Patient with smear of BM present in brief. UB bathing/dressing seated on BSC with noted functional use of RUE without cueing and Min A and LB bathing/dressing in sitting/standing with use of Stedy and Max A. Sit to stand in [place] with increased time, Max A and multimodal cues for sequencing. Stand to sit from [place] to wc with Mod A for controlled descent. Seated at sink level, patient completed 2/3 grooming tasks with supervision A and occasional cueing sequencing. Session concluded with patient seated in wc with call bell within reach, belt alarm activated, and all needs met.   Therapy Documentation Precautions:  Precautions Precautions: Fall Restrictions Weight Bearing Restrictions: No General:    Therapy/Group: Individual  Therapy  Wah Sabic R Howerton-Davis 05/22/2020, 7:26 AM

## 2020-05-22 NOTE — Progress Notes (Signed)
Physical Therapy Session Note  Patient Details  Name: Sheila Ray MRN: [id number] Date of Birth: [birth year]  Today's Date: 05/22/2020 PT Individual Time: 5883-2549 PT Individual Time Calculation (min): 45 min  PT Amount of Missed Time (min): 15 Minutes PT Missed Treatment Reason: Nursing care  Short Term Goals: Week 1:  PT Short Term Goal 1 (Week 1): pt to demonstrate min A for supine<>sit PT Short Term Goal 2 (Week 1): pt to demonstrate mod A functional transfers with LRAD PT Short Term Goal 3 (Week 1): pt to demonstrate 53' with WC propulsion min A PT Short Term Goal 4 (Week 1): pt demonstrate static standing balance min A PT Short Term Goal 5 (Week 1): iniate gait  Skilled Therapeutic Interventions/Progress Updates:    Attempted to see patient at scheduled therapy time, pt about to have I/O cath performed by nursing. Pt missed 15 min of scheduled therapy session for nursing care. Once this therapist returned pt agreeable to PT session, no complaints of pain. Supine to sit with mod A for BLE management. Attempt sit to stand to perform stand pivot transfer to w/c, pt unable to follow cues to safely stand. Sit to stand with min A to stedy. Stedy transfer to w/c. Sit to stand in // bars with max A. Ambulation 3 x 5 ft in // bars with mod to max A to advance RLE with use of multimodal cueing in attempt to initiate movement. Pt requests to return to bed at end of session. Max A to stand to stedy from w/c seat height. Stedy transfer back to bed. Sit to supine mod A for BLE management. Pt left semi-reclined in bed with needs in reach, bed alarm in place at end of session.  Therapy Documentation Precautions:  Precautions Precautions: Fall Restrictions Weight Bearing Restrictions: No General: PT Amount of Missed Time (min): 15 Minutes PT Missed Treatment Reason: Nursing care    Therapy/Group: Individual Therapy   Excell Seltzer, PT, DPT  05/22/2020, 5:32 PM

## 2020-05-22 NOTE — Progress Notes (Signed)
Sheila Ray PHYSICAL MEDICINE & REHABILITATION PROGRESS NOTE   Subjective/Complaints:  Pt c/o no BM in 2+ days- slept poorly overnight per pt- can't say why.  Denies pain.   ROS-limited by aphasia  Objective:   No results found. No results for input(s): WBC, HGB, HCT, PLT in the last 72 hours. Recent Labs    05/20/20 0447 05/21/20 0504  NA 140 139  K 3.1* 3.1*  CL 109 103  CO2 22 27  GLUCOSE 116* 109*  BUN <5* <5*  CREATININE 0.62 0.67  CALCIUM 8.8* 8.7*    Intake/Output Summary (Last 24 hours) at 05/22/2020 1931 Last data filed at 05/22/2020 1809 Gross per 24 hour  Intake 500 ml  Output 1850 ml  Net -1350 ml        Physical Exam: Vital Signs Blood pressure (!) 112/58, pulse 84, temperature 97.7 F (36.5 C), resp. rate 16, height 5\' 4"  (1.626 m), weight 82 kg, SpO2 98 %.    General: No acute distress; sitting up in bed- appropriate, but partially aphasic, NAD Mood and affect are appropriate Heart: RRR Lungs: CTA B/L- no W/R/R- good air movement Abdomen: soft, NT, ND, hypoactive BS Extremities: No clubbing, cyanosis, 2+ edema to calves B/L Skin: No evidence of breakdown, no evidence of rash   Neurologic: Cranial nerves II through XII intact, motor strength is 5/5 in Left and 4/5 RIght deltoid, bicep, tricep, grip,3/5 R hip flexor, knee extensors, ankle dorsiflexor and plantar flexor Pt apraxic, which may result in poor LE movement to command Sensory exam unable to assess due to fluent aphasia.  +word substitutions frequently- but kept shaking her head trying to come up with words  Musculoskeletal: Full range of motion in all 4 extremities. No joint swelling   Assessment/Plan: 1. Functional deficits which require 3+ hours per day of interdisciplinary therapy in a comprehensive inpatient rehab setting.  Physiatrist is providing close team supervision and 24 hour management of active medical problems listed below.  Physiatrist and rehab team continue to  assess barriers to discharge/monitor patient progress toward functional and medical goals  Care Tool:  Bathing    Body parts bathed by patient: Right arm, Left arm, Chest, Abdomen, Face, Right upper leg, Left upper leg   Body parts bathed by helper: Front perineal area, Buttocks, Right lower leg, Left lower leg     Bathing assist Assist Level: Moderate Assistance - Patient 50 - 74%     Upper Body Dressing/Undressing Upper body dressing   What is the patient wearing?: Button up shirt    Upper body assist Assist Level: Minimal Assistance - Patient > 75%    Lower Body Dressing/Undressing Lower body dressing      What is the patient wearing?: Incontinence brief, Pants     Lower body assist Assist for lower body dressing: Maximal Assistance - Patient 25 - 49%     Toileting Toileting    Toileting assist Assist for toileting: Maximal Assistance - Patient 25 - 49%     Transfers Chair/bed transfer  Transfers assist     Chair/bed transfer assist level: Dependent - mechanical lift     Locomotion Ambulation   Ambulation assist   Ambulation activity did not occur: Safety/medical concerns  Assist level: Maximal Assistance - Patient 25 - 49% Assistive device: Parallel bars Max distance: 5'   Walk 10 feet activity   Assist  Walk 10 feet activity did not occur: Safety/medical concerns        Walk 50 feet activity  Assist Walk 50 feet with 2 turns activity did not occur: Safety/medical concerns         Walk 150 feet activity   Assist Walk 150 feet activity did not occur: Safety/medical concerns         Walk 10 feet on uneven surface  activity   Assist Walk 10 feet on uneven surfaces activity did not occur: Safety/medical concerns         Wheelchair     Assist Will patient use wheelchair at discharge?: Yes Type of Wheelchair: Manual    Wheelchair assist level: Total Assistance - Patient < 25% Max wheelchair distance: 150     Wheelchair 50 feet with 2 turns activity    Assist        Assist Level: Maximal Assistance - Patient 25 - 49%   Wheelchair 150 feet activity     Assist      Assist Level: Total Assistance - Patient < 25%   Blood pressure (!) 112/58, pulse 84, temperature 97.7 F (36.5 C), resp. rate 16, height 5\' 4"  (1.626 m), weight 82 kg, SpO2 98 %.  Medical Problem List and Plan: 1.  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  Antithrombotics: -DVT/anticoagulation: Bilateral lower extremity Dopplers negative for DVT.  Eliquis             -antiplatelet therapy: N/A 3. Pain Management: Tylenol as needed. Complains of neck discomfort: will order kpad  11/22- denies pain- con't regimen 4. Mood: Provide emotional support             -antipsychotic agents: N/A 5. Neuropsych: This patient is capable of making decisions on her own behalf. 6. Skin/Wound Care: Routine skin checks Augmentin for cellulitis thru 11/18 7. Fluids/Electrolytes/Nutrition: Routine in and outs with follow-up chemistries 8.  Atrial fibrillation.  Cardizem 60 mg every 8 hours, Toprol-XL 100 mg daily.  Cardiac rate controlled to 80-90 on 11/15  11/22- Rate 84-90- con't regimen 9.  Diabetes mellitus.  Hemoglobin A1c 6.4.  SSI.  Patient on Glucophage 500 mg daily prior to admission.  Resume as needed CBG (last 3)  Recent Labs    05/22/20 1119 05/22/20 1631 05/22/20 1648  GLUCAP 105* 109* 103*  11/22- BGs well controlled- con't regimen  10.Hypertension.  Monitor with increased mobility.  Patient on lisinopril 40 mg daily prior to admission.  Resume as needed. Currently BP is soft.  Vitals:   05/22/20 1403 05/22/20 1931  BP: 104/64 (!) 112/58  Pulse:  61 84  Resp: 18 16  Temp: 97.8 F (36.6 C) 97.7 F (36.5 C)  SpO2: 99% 98%  controlled 11/19 11.  Hyperlipidemia.  Zetia 12.  Left lower extremity cellulitis.  Venous Doppler studies negative.  Complete course of Augmentin initiated 05/15/2020.  13.  Hypo K not on diuretics, may be nutritional , supplement x 2 d a repeat BMETlow normal will cont KCL at 19meq per day will increase supplementation due to potassium of 3.0  11/22- Last K+ 3.1 on 11/21-will recheck tomorrow, since repletion is complete 14.  Leukocytosis without fever improving, cont to monitor, may be resolving cellulitis   11/22-  will recheck WBC in AM 15.  Mild diastolic dysfunction, mod /severe tricuspid regurg, increased LE edema, chest x-ray shows pleural effusions      LOS: 7 days A FACE TO FACE EVALUATION WAS PERFORMED  Sheila Ray 05/22/2020, 7:31 PM

## 2020-05-22 NOTE — Progress Notes (Signed)
Speech Language Pathology Daily Session Note  Patient Details  Name: Sheila Ray MRN: [id number] Date of Birth: 1941/05/30  Today's Date: 05/22/2020 SLP Individual Time: 1115-1200 SLP Individual Time Calculation (min): 45 min  Short Term Goals: Week 1: SLP Short Term Goal 1 (Week 1): Pt will follow 2-step commands with 50% accuracy provided Max A multimodal cues. SLP Short Term Goal 2 (Week 1): Pt will demonstrate ability to problem solve functional situations with Mod A verbal/visual cues. SLP Short Term Goal 3 (Week 1): Pt will demonstrate recall of functional daily information and/or safety precautions with Mod A verbal/visual cues. SLP Short Term Goal 4 (Week 1): Pt will communicate at the phrase level to express basic wants and need with Mod A multimodal cues. SLP Short Term Goal 5 (Week 1): Pt will name produce phrase level utterances with Mod A multimodal cues for word finding strategies. SLP Short Term Goal 6 (Week 1): Pt will detect verbal and/or functional errors with Mod A multimodal cues.  Skilled Therapeutic Interventions: Pt was seen for skilled ST targeting speech/language and cognitive goals. SLP facilitated session with Max A multimodal cueing to aid in following 2-step directions, which pt completed with only ~25% accuracy. She demonstrated awareness of difficulty, stating "I'm not doing very good am I", although he awareness of errors as they were occurring did not seem as in tact. At beginning of session when asked to turn her TV off, she expressed she did not know how (despite previous review and external aid with instructions posted in room). Mod A verbal cues provided for recall for her to successfully use remote to turn TV off. She demonstrate recall of how to turn TV back on at end of session with Supervision A verbal prompts. During structured speech/language tasks, she also required Moderate semantic and phonemic, occasionally orthographic cues, to name 2 objects from  photos and describe their similar object function at the word and short phrase level with ~60% accuracy. Pt left sitting in chair with alarm set and needs within reach. Continue per current plan of care.         Pain Pain Assessment Pain Scale: 0-10 Pain Score: 0-No pain  Therapy/Group: Individual Therapy  Arbutus Leas 05/22/2020, 12:10 PM

## 2020-05-22 NOTE — Progress Notes (Signed)
Occupational Therapy Weekly Progress Note  Patient Details  Name: Sheila Ray MRN: [id number] Date of Birth: [birth year]  Beginning of progress report period: May 16, 2020 End of progress report period: May 22, 2020  Today's Date: 05/22/2020 OT Individual Time: 4098-1191 OT Individual Time Calculation (min): 45 min    Patient has met 2 of 4 short term goals. Patient making steady progress toward goals this progress period. Patient continues to demonstrate ataxia and expressive/receptive deficits limiting progress with functional tranfers this period. Patient currently demonstrates squat pivot transfers wc <> EOB and wc <> BSC with Mod A at best and Max A at most with multimodal cues for sequencing and hand/foot placement. Patient also continues to require Max A for toileting and LB dressing in sitting/standing with use of Stedy 2/2 decreased attention to R, decreased standing balance especially without BUE support on RW, and hemiparesis in RUE/RLE. Upon initial evaluation, patient was requiring Mod A for UB bathing/dressing. Patient currently able to bathe/dress UB with Min A and functional use of RUE with cues for thoroughness and use of hemi technique only.    Patient continues to demonstrate the following deficits: muscle weakness, decreased cardiorespiratoy endurance, impaired timing and sequencing, abnormal tone, unbalanced muscle activation, decreased coordination and decreased motor planning, decreased problem solving, decreased memory and delayed processing and decreased sitting balance, decreased standing balance, decreased postural control, hemiplegia and decreased balance strategies and therefore will continue to benefit from skilled OT intervention to enhance overall performance with BADL, iADL and Reduce care partner burden.  Patient progressing toward long term goals..  Continue plan of care.  OT Short Term Goals Week 1:  OT Short Term Goal 1 (Week 1): patient will  complete functional sit pivot or stand pivot transfers with mod A of one OT Short Term Goal 1 - Progress (Week 1): Met OT Short Term Goal 2 (Week 1): patient will complete UB bathing dressing with min A using right UE atleast 50% OT Short Term Goal 2 - Progress (Week 1): Met OT Short Term Goal 3 (Week 1): patient will complete LB bathing/dressing with mod A OT Short Term Goal 3 - Progress (Week 1): Progressing toward goal OT Short Term Goal 4 (Week 1): patient will complete 2/3 toileting components with mod A OT Short Term Goal 4 - Progress (Week 1): Progressing toward goal Week 2:  OT Short Term Goal 1 (Week 2): Patient will complete LB bathing/dressing with Mod A OT Short Term Goal 2 (Week 2): Patient will complete 2/3 toileting components with Mod A OT Short Term Goal 3 (Week 2): Patient will complete toilet transfer with use of LRAD and Mod A.  Skilled Therapeutic Interventions/Progress Updates:  Patient met lying supine in bed in agreement with OT treatment session. 0/10 pain at rest and with activity. Supine to EOB with assist at trunk and RLE. Squat-pivot transfer to wc on L with Mod to Max A and multimodal cues for hand placement. Attempted wc mobility with patient unable to self-propel wc 2/2 to deficits including ataxia and receptive aphasia. Total A for wc transport to therapy gym. In minimally distracting environment, patient donned shoes with Max A and assist to attain/maintain figure-4 position of BLE after addition of elastic shoelaces. Patient then tolerated 5 minutes on BUE arm ergometer with 2 rest breaks. Return to supine in same manner as noted above. Session concluded with patient lying supine in bed with call within reach, bed alarm activated, and all needs met.   Therapy Documentation  Precautions:  Precautions Precautions: Fall Restrictions Weight Bearing Restrictions: No General:    Therapy/Group: Individual Therapy  Gaberial Cada R Howerton-Davis 05/22/2020, 7:24 AM

## 2020-05-23 ENCOUNTER — Inpatient Hospital Stay (HOSPITAL_COMMUNITY): Payer: Medicare Other | Admitting: Occupational Therapy

## 2020-05-23 ENCOUNTER — Inpatient Hospital Stay (HOSPITAL_COMMUNITY): Payer: Medicare Other

## 2020-05-23 ENCOUNTER — Inpatient Hospital Stay (HOSPITAL_COMMUNITY): Payer: Medicare Other | Admitting: Physical Therapy

## 2020-05-23 ENCOUNTER — Inpatient Hospital Stay (HOSPITAL_COMMUNITY): Payer: Medicare Other | Admitting: Speech Pathology

## 2020-05-23 LAB — CBC WITH DIFFERENTIAL/PLATELET
Abs Immature Granulocytes: 0.07 10*3/uL (ref 0.00–0.07)
Basophils Absolute: 0.1 10*3/uL (ref 0.0–0.1)
Basophils Relative: 1 %
Eosinophils Absolute: 0.3 10*3/uL (ref 0.0–0.5)
Eosinophils Relative: 3 %
HCT: 35.1 % — ABNORMAL LOW (ref 36.0–46.0)
Hemoglobin: 10.6 g/dL — ABNORMAL LOW (ref 12.0–15.0)
Immature Granulocytes: 1 %
Lymphocytes Relative: 13 %
Lymphs Abs: 1.3 10*3/uL (ref 0.7–4.0)
MCH: 23.5 pg — ABNORMAL LOW (ref 26.0–34.0)
MCHC: 30.2 g/dL (ref 30.0–36.0)
MCV: 77.7 fL — ABNORMAL LOW (ref 80.0–100.0)
Monocytes Absolute: 1 10*3/uL (ref 0.1–1.0)
Monocytes Relative: 10 %
Neutro Abs: 7 10*3/uL (ref 1.7–7.7)
Neutrophils Relative %: 72 %
Platelets: 395 10*3/uL (ref 150–400)
RBC: 4.52 MIL/uL (ref 3.87–5.11)
RDW: 17 % — ABNORMAL HIGH (ref 11.5–15.5)
WBC: 9.7 10*3/uL (ref 4.0–10.5)
nRBC: 0 % (ref 0.0–0.2)

## 2020-05-23 LAB — BASIC METABOLIC PANEL
Anion gap: 7 (ref 5–15)
BUN: 6 mg/dL — ABNORMAL LOW (ref 8–23)
CO2: 28 mmol/L (ref 22–32)
Calcium: 9.2 mg/dL (ref 8.9–10.3)
Chloride: 104 mmol/L (ref 98–111)
Creatinine, Ser: 0.69 mg/dL (ref 0.44–1.00)
GFR, Estimated: >60 mL/min (ref >60)
Glucose, Bld: 114 mg/dL — ABNORMAL HIGH (ref 70–99)
Potassium: 3.9 mmol/L (ref 3.5–5.1)
Sodium: 139 mmol/L (ref 135–145)

## 2020-05-23 LAB — GLUCOSE, CAPILLARY
Glucose-Capillary: 100 mg/dL — ABNORMAL HIGH (ref 70–99)
Glucose-Capillary: 112 mg/dL — ABNORMAL HIGH (ref 70–99)
Glucose-Capillary: 137 mg/dL — ABNORMAL HIGH (ref 70–99)
Glucose-Capillary: 116 mg/dL — ABNORMAL HIGH (ref 70–99)

## 2020-05-23 MED ORDER — POTASSIUM CHLORIDE CRYS ER 10 MEQ PO TBCR
10.0000 meq | EXTENDED_RELEASE_TABLET | Freq: Every day | ORAL | Status: DC
  Administered 2020-05-23 – 2020-06-06 (×15): 10 meq via ORAL
  Filled 2020-05-23 (×15): qty 1

## 2020-05-23 NOTE — Progress Notes (Signed)
Physical Therapy Session Note  Patient Details  Name: Sheila Ray MRN: [id number] Date of Birth: 01-26-1941  Today's Date: 05/23/2020 PT Individual Time: 8295-6213 PT Individual Time Calculation (min): 41 min   Short Term Goals: Week 1:  PT Short Term Goal 1 (Week 1): pt to demonstrate min A for supine<>sit PT Short Term Goal 2 (Week 1): pt to demonstrate mod A functional transfers with LRAD PT Short Term Goal 3 (Week 1): pt to demonstrate 64' with WC propulsion min A PT Short Term Goal 4 (Week 1): pt demonstrate static standing balance min A PT Short Term Goal 5 (Week 1): iniate gait  Skilled Therapeutic Interventions/Progress Updates:    Patient received sitting up in bed, agreeable to PT. She denies pain, but endorses fatigue. RN and NT present to bladder scan patient. Stedy used with MinA x2 for transfer to toilet to attempt to void without success. She requires multimodal cues to complete functional tasks, such as standing up from toilet into [place]. Stedy with MinA x2 used to transfer from toilet > wc. PT propelling patient in wc to therapy gym for time management and energy conservation. She was able to stand in parallel bars with ModA and Sheila B LE with R knee intermittent blocking. Patient demonstrating poor activity tolerance requiring multiple frequent and extended rest breaks due to fatigue. Patient able to progress to forward walking and backward walking in parallel bars with B UE support. Patient able to successfully motor plan retro stepping while maintaining upright trunk posture. Patient returning to bed via squat pivot with ModA x2 and sit > supine with ModA x1. Bed alarm on, call light within reach.   Therapy Documentation Precautions:  Precautions Precautions: Fall Restrictions Weight Bearing Restrictions: No    Therapy/Group: Individual Therapy  Karoline Caldwell, PT, DPT, CBIS  05/23/2020, 7:42 AM

## 2020-05-23 NOTE — Progress Notes (Signed)
Physical Therapy Weekly Progress Note  Patient Details  Name: Sheila Ray MRN: [id number] Date of Birth: 20-Mar-1941  Beginning of progress report period: May 16, 2020 End of progress report period: May 24, 2020  Patient has met 4 of 5 short term goals.  Pt has made steady progress towards goals this period. She continues to require modA due to decreased motor planning for bed mobility and gait particularly in non-linear paths however has demonstrated improvements in w/c mobility, static standing and transfers. 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   With strong visual cues pt is able to ambulate with modA for short distances.   Patient continues to demonstrate the following deficits muscle weakness and muscle joint tightness, decreased cardiorespiratoy endurance and impaired timing and sequencing, abnormal tone, unbalanced muscle activation, motor apraxia, decreased coordination and decreased motor planning and therefore will continue to benefit from skilled PT intervention to increase functional independence with mobility.  Patient progressing toward long term goals..  Continue plan of care.  PT Short Term Goals Week 1:  PT Short Term Goal 1 (Week 1): pt to demonstrate min A for supine<>sit PT Short Term Goal 1 - Progress (Week 1): Progressing toward goal PT Short Term Goal 2 (Week 1): pt to demonstrate mod A functional transfers with LRAD PT Short Term Goal 2 - Progress (Week 1): Met PT Short Term Goal 3 (Week 1): pt to demonstrate 36' with WC propulsion min A PT Short Term Goal 3 - Progress (Week 1): Met PT Short Term Goal 4 (Week 1): pt demonstrate static standing balance min A PT Short Term Goal 4 - Progress (Week 1): Met PT Short Term Goal 5 (Week 1): iniate gait PT Short Term Goal 5 - Progress (Week 1): Met Week 2:  PT Short Term Goal 1 (Week 2): pt will move supine>< sit to L with min assist. PT Short Term Goal 2 (Week  2): pt will move sit>< stand with min assist consistently. PT Short Term Goal 3 (Week 2): pt will transfer bed>< wc with min assist 50% of attempts PT Short Term Goal 4 (Week 2): pt will perform gait with LRAD x 25' with min/mod assist     Precautions:  Precautions Precautions: Fall Restrictions Weight Bearing Restrictions: No      Therapy/Group: Individual Therapy  Rabiah Goeser  Sheila Ray, PT  05/24/2020, 3:07 PM

## 2020-05-23 NOTE — Progress Notes (Signed)
Occupational Therapy Session Note  Patient Details  Name: ALISCIA CLAYTON MRN: [id number] Date of Birth: 06/10/1941  Today's Date: 05/23/2020 OT Individual Time: 1115-1200 OT Individual Time Calculation (min): 45 min    Short Term Goals: Week 2:  OT Short Term Goal 1 (Week 2): Patient will complete LB bathing/dressing with Mod A OT Short Term Goal 2 (Week 2): Patient will complete 2/3 toileting components with Mod A OT Short Term Goal 3 (Week 2): Patient will complete toilet transfer with use of LRAD and Mod A.  Skilled Therapeutic Interventions/Progress Updates:    Patient seated in w/c, denies pain.  She states that she is tired.  Sit pivot transfer to/from w/c, mat table and bed with mod A .   She tolerated unsupported sitting with CS (occ CGA) for seated reach, balance, UB coordination and trunk control activities.  Sit to stand with left hand support x2 min A for 2 minutes each.  She returned to bed at close of session, sit to supine with mod A.  Bed alarm set and call bell in hand.    Therapy Documentation Precautions:  Precautions Precautions: Fall Restrictions Weight Bearing Restrictions: No   Therapy/Group: Individual Therapy  Carlos Levering 05/23/2020, 7:42 AM

## 2020-05-23 NOTE — Progress Notes (Addendum)
[place] PHYSICAL MEDICINE & REHABILITATION PROGRESS NOTE   Subjective/Complaints:  Remains aphasic but without other c/os, slept well, bladder incont  ROS-limited by aphasia  Objective:   No results found. Recent Labs    05/23/20 0511  WBC 9.7  HGB 10.6*  HCT 35.1*  PLT 395   Recent Labs    05/21/20 0504 05/23/20 0511  NA 139 139  K 3.1* 3.9  CL 103 104  CO2 27 28  GLUCOSE 109* 114*  BUN <5* 6*  CREATININE 0.67 0.69  CALCIUM 8.7* 9.2    Intake/Output Summary (Last 24 hours) at 05/23/2020 0849 Last data filed at 05/23/2020 9892 Gross per 24 hour  Intake 140 ml  Output 2750 ml  Net -2610 ml        Physical Exam: Vital Signs Blood pressure 129/72, pulse 79, temperature 98.3 F (36.8 C), temperature source Oral, resp. rate 16, height 5\' 4"  (1.626 m), weight 82 kg, SpO2 98 %.   General: No acute distress Mood and affect are appropriate Heart: Regular rate and rhythm no rubs murmurs or extra sounds Lungs: Clear to auscultation, breathing unlabored, no rales or wheezes Abdomen: Positive bowel sounds, soft nontender to palpation, nondistended Extremities: No clubbing, cyanosis, or edema  Neurologic: Cranial nerves II through XII intact, motor strength is 5/5 in Left and 4/5 RIght deltoid, bicep, tricep, grip,3/5 R hip flexor, knee extensors, ankle dorsiflexor and plantar flexor Pt apraxic, which may result in poor LE movement to command Sensory exam unable to assess due to fluent aphasia.  +word substitutions frequently- but kept shaking her head trying to come up with words  Musculoskeletal: Full range of motion in all 4 extremities. No joint swelling   Assessment/Plan: 1. Functional deficits which require 3+ hours per day of interdisciplinary therapy in a comprehensive inpatient rehab setting.  Physiatrist is providing close team supervision and 24 hour management of active medical problems listed below.  Physiatrist and rehab team continue to assess  barriers to discharge/monitor patient progress toward functional and medical goals  Care Tool:  Bathing    Body parts bathed by patient: Right arm, Left arm, Chest, Abdomen, Face, Right upper leg, Left upper leg   Body parts bathed by helper: Front perineal area, Buttocks, Right lower leg, Left lower leg     Bathing assist Assist Level: Moderate Assistance - Patient 50 - 74%     Upper Body Dressing/Undressing Upper body dressing   What is the patient wearing?: Button up shirt    Upper body assist Assist Level: Minimal Assistance - Patient > 75%    Lower Body Dressing/Undressing Lower body dressing      What is the patient wearing?: Incontinence brief, Pants     Lower body assist Assist for lower body dressing: Maximal Assistance - Patient 25 - 49%     Toileting Toileting    Toileting assist Assist for toileting: Maximal Assistance - Patient 25 - 49%     Transfers Chair/bed transfer  Transfers assist     Chair/bed transfer assist level: Dependent - mechanical lift     Locomotion Ambulation   Ambulation assist   Ambulation activity did not occur: Safety/medical concerns  Assist level: Maximal Assistance - Patient 25 - 49% Assistive device: Parallel bars Max distance: 5'   Walk 10 feet activity   Assist  Walk 10 feet activity did not occur: Safety/medical concerns        Walk 50 feet activity   Assist Walk 50 feet with 2 turns  activity did not occur: Safety/medical concerns         Walk 150 feet activity   Assist Walk 150 feet activity did not occur: Safety/medical concerns         Walk 10 feet on uneven surface  activity   Assist Walk 10 feet on uneven surfaces activity did not occur: Safety/medical concerns         Wheelchair     Assist Will patient use wheelchair at discharge?: Yes Type of Wheelchair: Manual    Wheelchair assist level: Total Assistance - Patient < 25% Max wheelchair distance: 150    Wheelchair  50 feet with 2 turns activity    Assist        Assist Level: Maximal Assistance - Patient 25 - 49%   Wheelchair 150 feet activity     Assist      Assist Level: Total Assistance - Patient < 25%   Blood pressure 129/72, pulse 79, temperature 98.3 F (36.8 C), temperature source Oral, resp. rate 16, height 5\' 4"  (1.626 m), weight 82 kg, SpO2 98 %.  Medical Problem List and Plan: 1.  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  Antithrombotics: -DVT/anticoagulation: Bilateral lower extremity Dopplers negative for DVT.  Eliquis             -antiplatelet therapy: N/A 3. Pain Management: Tylenol as needed. Complains of neck discomfort: will order kpad  11/22- denies pain- con't regimen 4. Mood: Provide emotional support             -antipsychotic agents: N/A 5. Neuropsych: This patient is capable of making decisions on her own behalf. 6. Skin/Wound Care: Routine skin checks Augmentin for cellulitis thru 11/18 7. Fluids/Electrolytes/Nutrition: Routine in and outs with follow-up chemistries 8.  Atrial fibrillation.  Cardizem 60 mg every 8 hours, Toprol-XL 100 mg daily.  Cardiac rate controlled to 80-90 on 11/15  11/23- Rate 84-79- con't regimen 9.  Diabetes mellitus.  Hemoglobin A1c 6.4.  SSI.  Patient on Glucophage 500 mg daily prior to admission.  Resume as needed CBG (last 3)  Recent Labs    05/22/20 1648 05/22/20 2048 05/23/20 0556  GLUCAP 103* 160* 100*  11/22- BGs well controlled- con't regimen  10.Hypertension.  Monitor with increased mobility.  Patient on lisinopril 40 mg daily prior to admission.  Resume as needed. Currently BP is soft.  Vitals:   05/22/20 1931 05/23/20 0546  BP: (!) 112/58 129/72   Pulse: 84 79  Resp: 16 16  Temp: 97.7 F (36.5 C) 98.3 F (36.8 C)  SpO2: 98% 98%  controlled 11/23 11.  Hyperlipidemia.  Zetia 12.  Left lower extremity cellulitis.  Venous Doppler studies negative.  Complete course of Augmentin initiated 05/15/2020.  13.  Hypo K not on diuretics, may be nutritional , supplement x 2 d a repeat BMETlow normal will cont KCL at 94meq per day will increase supplementation due to potassium of 3.0  11/23 K+ normalized ,needed extra K+ due to diuresis with lasix x 3d will give daily dose of 44meq and recheck on Friday  14.  Leukocytosis resolved 15.  Mild diastolic dysfunction,  mod /severe tricuspid regurg, increased LE edema, chest x-ray shows pleural effusions diuresed for 3d     LOS: 8 days A FACE TO FACE EVALUATION WAS PERFORMED  Charlett Blake 05/23/2020, 8:49 AM

## 2020-05-23 NOTE — Progress Notes (Signed)
Speech Language Pathology Weekly Progress and Session Note  Patient Details  Name: MARWA FUHRMAN MRN: [id number] Date of Birth: 1940-08-11  Beginning of progress report period: May 16, 2020 End of progress report period: May 23, 2020  Today's Date: 05/23/2020 SLP Individual Time: 4098-1191 SLP Individual Time Calculation (min): 26 min  Short Term Goals: Week 1: SLP Short Term Goal 1 (Week 1): Pt will follow 2-step commands with 50% accuracy provided Max A multimodal cues. SLP Short Term Goal 1 - Progress (Week 1): Progressing toward goal SLP Short Term Goal 2 (Week 1): Pt will demonstrate ability to problem solve functional situations with Mod A verbal/visual cues. SLP Short Term Goal 2 - Progress (Week 1): Met SLP Short Term Goal 3 (Week 1): Pt will demonstrate recall of functional daily information and/or safety precautions with Mod A verbal/visual cues. SLP Short Term Goal 3 - Progress (Week 1): Met SLP Short Term Goal 4 (Week 1): Pt will communicate at the phrase level to express basic wants and need with Mod A multimodal cues. SLP Short Term Goal 4 - Progress (Week 1): Met SLP Short Term Goal 5 (Week 1): Pt will name produce phrase level utterances with Mod A multimodal cues for word finding strategies. SLP Short Term Goal 5 - Progress (Week 1): Met SLP Short Term Goal 6 (Week 1): Pt will detect verbal and/or functional errors with Mod A multimodal cues. SLP Short Term Goal 6 - Progress (Week 1): Met    New Short Term Goals: Week 2: SLP Short Term Goal 1 (Week 2): Pt will follow 2-step commands with 50% accuracy provided Max A multimodal cues. SLP Short Term Goal 2 (Week 2): Pt will demonstrate ability to problem solve functional situations with Min A verbal/visual cues. SLP Short Term Goal 3 (Week 2): Pt will communicate at the phrase level to express basic wants and need with Min A multimodal cues. SLP Short Term Goal 4 (Week 2): Pt will name produce phrase level  utterances with Min A multimodal cues for word finding strategies. SLP Short Term Goal 5 (Week 2): Pt will detect verbal and/or functional errors with Min A multimodal cues.  Weekly Progress Updates: Pt has made functional gains and met 5 out of 6 short term goals. 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 Pt has demonstrated improved ability to express her basic wants and needs to staff, as well as word finding and responsiveness to written and semantic cues. Her basic problem solving, attention, and awareness of functional and verbal errors has also been steadily improving. Pt education is ongoing; no family has been present for ST sessions yet. Pt would continue to benefit from skilled ST while inpatient in order to maximize functional independence and reduce burden of care prior to discharge. Anticipate that pt will need 24/7 supervision at discharge in addition to [place] follow up at next level of care.       Intensity: Minumum of 1-2 x/day, 30 to 90 minutes Frequency: 3 to 5 out of 7 days Duration/Length of Stay: 06/06/20 Treatment/Interventions: Cognitive remediation/compensation;Cueing hierarchy;Speech/Language facilitation;Functional tasks;Patient/family education;Therapeutic Activities;Internal/external aids   Daily Session  Skilled Therapeutic Interventions: Pt was seen for skilled ST targeting communication goals. Upon arrival pt able to communicate to therapist that she had "dropped her glasses last night" and requested therapist to locate them. Although not on the floor, SLP located glasses  and pt was pleased. She also completed phrase closure tasks with primarily picture supports and intermittent Min A written cue (first letter of word) to complete the phrase with 90% accuracy. She also engaged in simple  conversational exchange about Thanksgiving and identified "[place] leg" and "minced meat pie" as 2 items she hoped she could eat. Pt left laying in bed with alarm set and needs within reach. Continue per current plan of care.           Pain Pain Assessment Pain Scale: 0-10 Pain Score: 0-No pain  Therapy/Group: Individual Therapy  Arbutus Leas 05/23/2020, 7:32 AM

## 2020-05-23 NOTE — Progress Notes (Signed)
Physical Therapy Session Note  Patient Details  Name: Sheila Ray MRN: [id number] Date of Birth: [birth year]  Today's Date: 05/23/2020 PT Individual Time: 1000-1100 PT Individual Time Calculation (min): 60 min   Short Term Goals: Week 1:  PT Short Term Goal 1 (Week 1): pt to demonstrate min A for supine<>sit PT Short Term Goal 2 (Week 1): pt to demonstrate mod A functional transfers with LRAD PT Short Term Goal 3 (Week 1): pt to demonstrate 29' with WC propulsion min A PT Short Term Goal 4 (Week 1): pt demonstrate static standing balance min A PT Short Term Goal 5 (Week 1): iniate gait  Skilled Therapeutic Interventions/Progress Updates: Pt presented in bed agreeable to therapy. Pt indicated need for "bathroom", performed supine to sit with modA and use of bed features. PTA threaded pants and shoes total A and performed STS with minA. PTA pulled pants over hips and pt performed stand pivot transfer to L with modA. Pt then transported to toilet and performed stand pivot with use of wall rail and modA, with total A for LB clothing management (-BM/void). Pt then returned to w/c in same manner as prior and moved over to sink. Pt participated in oral hygiene with set up assist and brushed hair with supervision. Pt then transported to rehab gym and performed stand pivot transfer to mat with mod A. Participated in toe taps to target with dots on ground for forced use of RLE and coordination. Pt required initially minA for placing foot on target but improved with repetition. Pt then participated in gait training 34f x 2 with RW and modA. Pt receptive to verbal cues to step forward towards orange band on RW but unable to motor plan stepping backwards with RLE when turning to sit in w/c. At end of session pt transported back to room and agreeable to remain in w/c. Pt left with belt alarm on, call bell within reach and needs met.      Therapy Documentation Precautions:  Precautions Precautions:  Fall Restrictions Weight Bearing Restrictions: No General:   Vital Signs: Therapy Vitals Temp: 98 F (36.7 C) Pulse Rate: 72 Resp: 17 BP: 107/72 Patient Position (if appropriate): Sitting Oxygen Therapy SpO2: 99 % O2 Device: Room Air Pain:   Mobility:   Locomotion :    Trunk/Postural Assessment :    Balance:   Exercises:   Other Treatments:      Therapy/Group: Individual Therapy  Sheila Ray 05/23/2020, 4:08 PM

## 2020-05-24 ENCOUNTER — Inpatient Hospital Stay (HOSPITAL_COMMUNITY): Payer: Medicare Other

## 2020-05-24 ENCOUNTER — Inpatient Hospital Stay (HOSPITAL_COMMUNITY): Payer: Medicare Other | Admitting: Physical Therapy

## 2020-05-24 ENCOUNTER — Inpatient Hospital Stay (HOSPITAL_COMMUNITY): Payer: Medicare Other | Admitting: Occupational Therapy

## 2020-05-24 LAB — GLUCOSE, CAPILLARY
Glucose-Capillary: 99 mg/dL (ref 70–99)
Glucose-Capillary: 105 mg/dL — ABNORMAL HIGH (ref 70–99)
Glucose-Capillary: 118 mg/dL — ABNORMAL HIGH (ref 70–99)
Glucose-Capillary: 129 mg/dL — ABNORMAL HIGH (ref 70–99)

## 2020-05-24 MED ORDER — EXERCISE FOR HEART AND HEALTH BOOK
Freq: Once | Status: AC
  Filled 2020-05-24: qty 1

## 2020-05-24 NOTE — Progress Notes (Signed)
Physical Therapy Session Note  Patient Details  Name: Sheila Ray MRN: [id number] Date of Birth: [birth year]  Today's Date: 05/24/2020 PT Individual Time: [id number] and 1445-1530 PT Individual Time Calculation (min): 60 min and 45 min  Short Term Goals: Week 1:  PT Short Term Goal 1 (Week 1): pt to demonstrate min A for supine<>sit PT Short Term Goal 1 - Progress (Week 1): Progressing toward goal PT Short Term Goal 2 (Week 1): pt to demonstrate mod A functional transfers with LRAD PT Short Term Goal 2 - Progress (Week 1): Met PT Short Term Goal 3 (Week 1): pt to demonstrate 46' with WC propulsion min A PT Short Term Goal 3 - Progress (Week 1): Met PT Short Term Goal 4 (Week 1): pt demonstrate static standing balance min A PT Short Term Goal 4 - Progress (Week 1): Met PT Short Term Goal 5 (Week 1): iniate gait PT Short Term Goal 5 - Progress (Week 1): Met Week 2:  PT Short Term Goal 1 (Week 2): pt will move supine>< sit to L with min assist. PT Short Term Goal 2 (Week 2): pt will move sit>< stand with min assist consistently. PT Short Term Goal 3 (Week 2): pt will transfer bed>< wc with min assist 50% of attempts PT Short Term Goal 4 (Week 2): pt will perform gait with LRAD x 25' with min/mod assist  Skilled Therapeutic Interventions/Progress Updates:  Pt presented in bed agreeable to therapy. Pt denies pain at start of session. PTA donned TED hose total A and pt performed supine to sit modA. PTA threaded pants and donned shoes total A. Pt performed STS with minA and PTA pulled up pants over hips. Pt then performed stand pivot with modA and max multimodal cues. Pt then transferred to bathroom and performed stand pivot transfer to toilet with use of wall rail (-urinary void/BM). Pt returned to w/c via same method then transferred to sink to perform oral hygiene with minA due to pt unable to bend forward enough to clear sink when rinsing. Pt then transported to day room and participated in  stand pivot with modA to mat. Pt participated in STS with RW with minA, then performed step forward/backwards with RLE for forced use NMR. Pt required max multimodal cues with PTA facilitating at hips to promote wt shift to L. Pt then attempted same activity with PTA in front of pt however pt with noted increased anxiety and able to express would rather try activity in parallel bars. Pt performed stand pivot to w/c with modA and transported to rehab gym. When PTA moved pt into parallel bars pt unable to maintain RLE clearance and foot got caught under w/c causing knee to be hyperflexed. Pt c/o pain with limited relief (TTP at quad tendon). Pt transported back to room and performed squat pivot transfer to bed with modA. Pt repositioned to comfort and ice pack applied with nsg notified. Pt left in bed with bed alarm on, call bell within reach and needs met.   Tx2: Pt presented in bed agreeable to therapy with encouragement as pt appeared fatigued. Pt performed supine to sit with modA and use of bed features. Performed squat pivot to w/c to L with maxA and max cues. Pt transported to ortho gym and attempted to have pt participate in BITS visual scanning program however pt becoming overwhelmed and stating "can we do something else". Pt agreeable to participate in w/c mobility. Performed w/c mobility with mod/maxA 73ft x 4. Pt unable  to coordination using both UE and LE for hemi technique. PTA providing HOH assist for UE propulsion with pt intermittently using LLE and vice versa when providing assistance to LLE. Pt then transported back to room and performed stand pivot to bed with maxA due to pt becoming distracted and reaching for bed rail instead of keeping hand on RW. Pt required minA to return to supine. Pt required modA for scooting to [facility] via bridge. Pt repositioned to comfort and left with bed alarm on, call bell within reach and needs met.      Therapy Documentation Precautions:  Precautions Precautions:  Fall Restrictions Weight Bearing Restrictions: No General:   Vital Signs: Therapy Vitals BP: 108/64 Pain:   Mobility:   Locomotion :    Trunk/Postural Assessment :    Balance:   Exercises:   Other Treatments:      Therapy/Group: Individual Therapy  Jarod Bozzo 05/24/2020, 4:25 PM

## 2020-05-24 NOTE — Progress Notes (Signed)
Patient ID: Sheila Ray, female   DOB: Sep 26, 1940, 79 y.o.   MRN: [id number] Team Conference Report to Patient/Family  Team Conference discussion was reviewed with the patient and caregiver, including goals, any changes in plan of care and target discharge date.  Patient and caregiver express understanding and are in agreement.  The patient has a target discharge date of 06/06/20.  Dyanne Iha 05/24/2020, 1:14 PM

## 2020-05-24 NOTE — Patient Care Conference (Signed)
Inpatient RehabilitationTeam Conference and Plan of Care Update Date: 05/24/2020   Time: 10:05 AM    Patient Name: [place] Record Number: [id number]  Date of Birth: [birth year] Sex: Female         Room/Bed: 4W10C/4W10C-01 Payor Info: Payor: [employer] MEDICARE / Plan: [facility] MEDICARE / Product Type: *No Product type* /    Admit Date/Time:  05/15/2020  2:03 PM  Primary Diagnosis:  Left middle cerebral artery stroke [facility])  Hospital Problems: Principal Problem:   Left middle cerebral artery stroke [facility])    Expected Discharge Date: Expected Discharge Date: 06/06/20  Team Members Present: Physician leading conference: Dr. Leeroy Cha Care Coodinator Present: Dorien Chihuahua, RN, BSN, CRRN;Christina Sampson Goon, [place] Nurse Present: Other (comment) Jeanette Caprice RN) PT Present: Phylliss Bob, PTA OT Present: Elisabeth Most, OT SLP Present: Nadara Mode, SLP PPS Coordinator present : Ileana Ladd, Burna Mortimer, SLP     Current Status/Progress Goal Weekly Team Focus  Bowel/Bladder   pt inc of b and b in and out cath q 8 no void, sediment noted in urine   becoime continent of b and b   continue in and out cath as needed, times toilette    Swallow/Nutrition/ Hydration             ADL's   UB bathing/dressing min A, LB bathing/dressing max A, functional transfer mod/max A  CS/min A  adl training, sitting and standing balance, awareness and functional use of right, transfer training, family education   Mobility   modA bed mobility, modA squat pivot, mod/maxA stand pivot, minA STS, mod/maxA gait 45ft with RW due to poor motor planning of RLE  CGA transfers; WC supervision VS. Household ambulation CGA  R NMR, w/c mobiliyt, transfers, standing balance, gait   Communication   Min-Mod A phrase and sentence level communication, Min-Mod A comprehension of directions depending on complexity, awareness of verbal errors much improved this week  Min  expressing basic wants  and needs, word finding, correcting verbal errors and mild perseverations, following 2-step directions   Safety/Cognition/ Behavioral Observations  Min A  supervision  recall, safety, basic problem solving, selective attention, emergent awareness   Pain   pt has no current c/o pain   remain free of pain   assess q shift and prn    Skin   bruising on abdomen  remain free of skin breakdown   assess q shift and prn      Discharge Planning:  D/c home with son at supervision to [place] (24/7). 1level home level entry   Team Discussion: Vitals stable, labs WNL and  No medical issues reported per MD. Note continue with timed toileting and need for occasional intermittent catheterization for urinary retention. Progress impaired by distractibility,  And perseveration, Note word finding deficits and poor error awareness. Patient on target to meet rehab goals: yes, CBG-supervision goals set  *See Care Plan and progress notes for long and short-term goals.   Revisions to Treatment Plan:   Teaching Needs: Transfers, toileting, medications, etc.   Current Barriers to Discharge: Decreased caregiver support and Home enviroment access/layout, intermittent catheterization  Possible Resolutions to Barriers: Family education Practice with stairs    Medical Summary Current Status: Neck pain is well controlled, easily distractible, K has normalized, HR has been well controlled, CBG elevated to 137, vitals stable  Barriers to Discharge: Behavior;Medical stability  Barriers to Discharge Comments: Easily distractible, expressive asphasia, hypokalemia, poor motor planning and awareness,  elevated CBGs Possible Resolutions to [employer] Focus: Repeat potassium on Friday, monitor CBGs AC/HS and resume home meds if needed   Continued Need for Acute Rehabilitation Level of Care: The patient requires daily medical management by a physician with specialized training in physical medicine and rehabilitation  for the following reasons: Direction of a multidisciplinary physical rehabilitation program to maximize functional independence : Yes Medical management of patient stability for increased activity during participation in an intensive rehabilitation regime.: Yes Analysis of laboratory values and/or radiology reports with any subsequent need for medication adjustment and/or medical intervention. : Yes   I attest that I was present, lead the team conference, and concur with the assessment and plan of the team.   Dorien Chihuahua B 05/24/2020, 1:58 PM

## 2020-05-24 NOTE — Progress Notes (Signed)
[facility] PHYSICAL MEDICINE & REHABILITATION PROGRESS NOTE   Subjective/Complaints: She has no complaints this morning Denies pain.   ROS-limited by aphasia  Objective:   No results found. Recent Labs    05/23/20 0511  WBC 9.7  HGB 10.6*  HCT 35.1*  PLT 395   Recent Labs    05/23/20 0511  NA 139  K 3.9  CL 104  CO2 28  GLUCOSE 114*  BUN 6*  CREATININE 0.69  CALCIUM 9.2    Intake/Output Summary (Last 24 hours) at 05/24/2020 0949 Last data filed at 05/24/2020 0700 Gross per 24 hour  Intake 480 ml  Output 2200 ml  Net -1720 ml        Physical Exam: Vital Signs Blood pressure 108/74, pulse 75, temperature 97.7 F (36.5 C), resp. rate 16, height 5\' 4"  (1.626 m), weight 82 kg, SpO2 93 %. Gen: no distress, normal appearing HEENT: oral mucosa pink and moist, NCAT Cardio: Reg rate Chest: normal effort, normal rate of breathing Abd: soft, non-distended Ext: no edema Skin: intact Psych: pleasant, normal affect Neurologic: Cranial nerves II through XII intact, motor strength is 5/5 in Left and 4/5 RIght deltoid, bicep, tricep, grip,3/5 R hip flexor, knee extensors, ankle dorsiflexor and plantar flexor Pt apraxic, which may result in poor LE movement to command Sensory exam unable to assess due to fluent aphasia.  +word substitutions frequently- but kept shaking her head trying to come up with words  Musculoskeletal: Full range of motion in all 4 extremities. No joint swelling   Assessment/Plan: 1. Functional deficits which require 3+ hours per day of interdisciplinary therapy in a comprehensive inpatient rehab setting.  Physiatrist is providing close team supervision and 24 hour management of active medical problems listed below.  Physiatrist and rehab team continue to assess barriers to discharge/monitor patient progress toward functional and medical goals  Care Tool:  Bathing    Body parts bathed by patient: Right arm, Left arm, Chest, Abdomen, Face,  Right upper leg, Left upper leg   Body parts bathed by helper: Front perineal area, Buttocks, Right lower leg, Left lower leg     Bathing assist Assist Level: Moderate Assistance - Patient 50 - 74%     Upper Body Dressing/Undressing Upper body dressing   What is the patient wearing?: Button up shirt    Upper body assist Assist Level: Minimal Assistance - Patient > 75%    Lower Body Dressing/Undressing Lower body dressing      What is the patient wearing?: Incontinence brief, Pants     Lower body assist Assist for lower body dressing: Maximal Assistance - Patient 25 - 49%     Toileting Toileting    Toileting assist Assist for toileting: Maximal Assistance - Patient 25 - 49%     Transfers Chair/bed transfer  Transfers assist     Chair/bed transfer assist level: 2 Helpers     Locomotion Ambulation   Ambulation assist   Ambulation activity did not occur: Safety/medical concerns  Assist level: Minimal Assistance - Patient > 75% Assistive device: Parallel bars Max distance: 8   Walk 10 feet activity   Assist  Walk 10 feet activity did not occur: Safety/medical concerns        Walk 50 feet activity   Assist Walk 50 feet with 2 turns activity did not occur: Safety/medical concerns         Walk 150 feet activity   Assist Walk 150 feet activity did not occur: Safety/medical concerns  Walk 10 feet on uneven surface  activity   Assist Walk 10 feet on uneven surfaces activity did not occur: Safety/medical concerns         Wheelchair     Assist Will patient use wheelchair at discharge?: Yes Type of Wheelchair: Manual    Wheelchair assist level: Total Assistance - Patient < 25% Max wheelchair distance: 150    Wheelchair 50 feet with 2 turns activity    Assist        Assist Level: Maximal Assistance - Patient 25 - 49%   Wheelchair 150 feet activity     Assist      Assist Level: Total Assistance - Patient <  25%   Blood pressure 108/74, pulse 75, temperature 97.7 F (36.5 C), resp. rate 16, height 5\' 4"  (1.626 m), weight 82 kg, SpO2 93 %.  Medical Problem List and Plan: 1.  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  Antithrombotics: -DVT/anticoagulation: Bilateral lower extremity Dopplers negative for DVT.  Eliquis             -antiplatelet therapy: N/A 3. Pain Management: Tylenol as needed. Complains of neck discomfort: will order kpad  11/24: pain is well controlled.  4. Mood: Provide emotional support             -antipsychotic agents: N/A 5. Neuropsych: This patient is capable of making decisions on her own behalf. 6. Skin/Wound Care: Routine skin checks Augmentin for cellulitis thru 11/18 7. Fluids/Electrolytes/Nutrition: Routine in and outs with follow-up chemistries 8.  Atrial fibrillation.  Cardizem 60 mg every 8 hours, Toprol-XL 100 mg daily.  11/24: HR is well controlled.  9.  Diabetes mellitus.  Hemoglobin A1c 6.4.  SSI.  Patient on Glucophage 500 mg daily prior to admission.  Resume as needed CBG (last 3)  Recent Labs    05/23/20 1625 05/23/20 2108 05/24/20 0552  GLUCAP 137* 116* 99  11/24: CBGs elevated to 137 yesterday- continue to monitor.  10.Hypertension.  Monitor with increased mobility.  Patient on lisinopril 40 mg daily prior to admission.  Resume as needed. Currently BP is soft.  Vitals:   05/23/20 1938 05/24/20 0456  BP: 126/88 108/74  Pulse: 68 75  Resp: 17 16  Temp: 97.8 F (36.6 C) 97.7 F (36.5 C)  SpO2: 98% 93%  controlled 11/24 11.  Hyperlipidemia.  Zetia 12.  Left lower extremity cellulitis.  Venous Doppler studies negative.  Complete course of Augmentin initiated 05/15/2020.  13.  Hypo K not on  diuretics, may be nutritional , supplement x 2 d a repeat BMETlow normal will cont KCL at 11meq per day will increase supplementation due to potassium of 3.0  11/23 K+ normalized ,needed extra K+ due to diuresis with lasix x 3d will give daily dose of 68meq and recheck on Friday  14.  Leukocytosis resolved 15.  Mild diastolic dysfunction, mod /severe tricuspid regurg, increased LE edema, chest x-ray shows pleural effusions diuresed for 3d     LOS: 9 days A FACE TO FACE EVALUATION WAS PERFORMED  Clide Deutscher Saturnino Liew 05/24/2020, 9:49 AM

## 2020-05-24 NOTE — Progress Notes (Signed)
Dan Angiulli notified no new orders.

## 2020-05-24 NOTE — Progress Notes (Signed)
Patient ID: Sheila Ray, female   DOB: 07/02/1940, 79 y.o.   MRN: [id number] Met with the patient to review role of the  Nurse CM and collaboration with  SW to facilitate preparation for discharge. Patient with memory and communication issues post CVA however able to acknowledge some understanding of discussion. Handouts left at bedside for family for management of secondary stroke risks including dyslipdemia, heart failure and HTN. DM managed with A1C of 6.4 on admission. Patient able to communicate caregiver would be her son and nursing to follow up with family on discharge education. Continue to follow along to discharge for educational needs. Margarito Liner

## 2020-05-24 NOTE — Progress Notes (Signed)
Occupational Therapy Session Note  Patient Details  Name: GALI SPINNEY MRN: [id number] Date of Birth: 11-Jan-1941  Today's Date: 05/24/2020 OT Individual Time: 9211-9417 OT Individual Time Calculation (min): 45 min    Short Term Goals: Week 2:  OT Short Term Goal 1 (Week 2): Patient will complete LB bathing/dressing with Mod A OT Short Term Goal 2 (Week 2): Patient will complete 2/3 toileting components with Mod A OT Short Term Goal 3 (Week 2): Patient will complete toilet transfer with use of LRAD and Mod A.  Skilled Therapeutic Interventions/Progress Updates:    Patient in bed, alert and agreeable to participate in therapy session.  She notes fatigue and right leg is sore but pain is under control.  Supine to sitting edge of bed with cues and mod A.  Max A to donn sneakers seated edge of bed with no LOB.  Sit pivot transfer bed to w/c mod A.  Sit pivot transfer to/from toilet mod A.  Clothing management and hygiene max A.  Grooming/oral care seated at sink with set up.  Assisted with shampoo cap.   Completed right hand dexterity and hand writing activity - she was able to scan paper to circle identified letter 90%, unable to understand directions for word search activity.  She remained seated in w/c at close of session, seat belt alarm set and call bell in reach.    Therapy Documentation Precautions:  Precautions Precautions: Fall Restrictions Weight Bearing Restrictions: No   Therapy/Group: Individual Therapy  Carlos Levering 05/24/2020, 7:34 AM

## 2020-05-24 NOTE — Progress Notes (Signed)
Physical Therapy Session Note  Patient Details  Name: Sheila Ray MRN: [id number] Date of Birth: 01-07-1941  Today's Date: 05/24/2020 PT Individual Time: 1300-1405 PT Individual Time Calculation (min): 65 min   Short Term Goals: Week 1:  PT Short Term Goal 1 (Week 1): pt to demonstrate min A for supine<>sit PT Short Term Goal 1 - Progress (Week 1): Progressing toward goal PT Short Term Goal 2 (Week 1): pt to demonstrate mod A functional transfers with LRAD PT Short Term Goal 2 - Progress (Week 1): Met PT Short Term Goal 3 (Week 1): pt to demonstrate 76' with WC propulsion min A PT Short Term Goal 3 - Progress (Week 1): Met PT Short Term Goal 4 (Week 1): pt demonstrate static standing balance min A PT Short Term Goal 4 - Progress (Week 1): Met PT Short Term Goal 5 (Week 1): iniate gait PT Short Term Goal 5 - Progress (Week 1): Met Week 2:  PT Short Term Goal 1 (Week 2): pt will move supine>< sit to L with min assist. PT Short Term Goal 2 (Week 2): pt will move sit>< stand with min assist consistently. PT Short Term Goal 3 (Week 2): pt will transfer bed>< wc with min assist 50% of attempts PT Short Term Goal 4 (Week 2): pt will perform gait with LRAD x 25' with min/mod assist  Skilled Therapeutic Interventions/Progress Updates:  Pt resting in bed.  She denied pain and was not tender R patellar tendon. RN reported that pt was retaining urine, and encouraged pt to try later.  neuromuscular re-education via multimodal cues for supine-- cervical flexion, L straight leg raises for abdominal activation, R assisted scapular protraction with extended elbow, L scapular protraction,  bil bridging with assistance for stabilizing and loading RLE; R/L side lying -- L/R hip abduction with flexed hips and knees; in sitting- R lateral leans to facilitate supine> sit and load RUE.  Seated EOB, trunk flexion/extension with hands forward, to facilitate forward wt shift for transfers.  Sit> partial  stand/pivot with mod assist with difficulty getting R hip onto seat of wc due to inattention. W/c> bed at end of session, max assist sit to stand, mod assist to pivot and sit on bed, pulling up iwht L hand on bed rail.  Toilet transfer with mod assist.  Total assist for clothing mgt.  Pt continent of a small amount of urine.  Total assist for hygiene in sitting, and clothing mgt in standing.  Hand washing with cues, from w/c level.  Sit> stand at foot of bed, with R knee remaining in slight flexion, for wt shifting L><R.  Pt spontaneously took a step with R foot to position herself squarely facing foot board.  Pt exhausted and declined attempting gait training.  At end of session, pt resting in bed with needs at hand and bed alarm set.  PT informed LeeAnn, NT that pt needed to be cath'd before next therapy session at 1445.     Therapy Documentation Precautions:  Precautions Precautions: Fall Restrictions Weight Bearing Restrictions: No       Therapy/Group: Individual Therapy  , 05/24/2020, 2:26 PM

## 2020-05-25 LAB — GLUCOSE, CAPILLARY
Glucose-Capillary: 123 mg/dL — ABNORMAL HIGH (ref 70–99)
Glucose-Capillary: 136 mg/dL — ABNORMAL HIGH (ref 70–99)
Glucose-Capillary: 109 mg/dL — ABNORMAL HIGH (ref 70–99)
Glucose-Capillary: 157 mg/dL — ABNORMAL HIGH (ref 70–99)

## 2020-05-25 MED ORDER — SORBITOL 70 % SOLN
30.0000 mL | Freq: Once | Status: AC
  Administered 2020-05-25: 30 mL via ORAL

## 2020-05-25 NOTE — Progress Notes (Signed)
[facility] PHYSICAL MEDICINE & REHABILITATION PROGRESS NOTE   Subjective/Complaints:  Pt reports LBM has been awhile- feels constipated- per chart, LBM 2 days ago- will give Sorbitol.  Not eating breakfast- "not hungry' per pt.   Denies pain.   Also heard word salad this AM- didn't understand anything for ~ 30-45 seconds.    ROS-limited by aphasia  Objective:   No results found. Recent Labs    05/23/20 0511  WBC 9.7  HGB 10.6*  HCT 35.1*  PLT 395   Recent Labs    05/23/20 0511  NA 139  K 3.9  CL 104  CO2 28  GLUCOSE 114*  BUN 6*  CREATININE 0.69  CALCIUM 9.2    Intake/Output Summary (Last 24 hours) at 05/25/2020 1021 Last data filed at 05/25/2020 0900 Gross per 24 hour  Intake 315 ml  Output 1050 ml  Net -735 ml        Physical Exam: Vital Signs Blood pressure (!) 100/54, pulse 64, temperature 97.7 F (36.5 C), resp. rate 16, height 5\' 4"  (1.626 m), weight 82 kg, SpO2 99 %. Gen: sitting up in bed- appropriate, word finding deficits and word salad sometimes, NAD- hasn't eaten ANY breakfast HEENT: wearing eyeglasses Cardio: RRR Chest: CTA B/L- no W/R/R- good air movement Abd: soft, slightly distended; NT, hypoactive Ext: no edema Skin: intact Psych: pleasant affect Neurologic: word salad for 30-60 seconds- and severe word finding deficits.  Cranial nerves II through XII intact, motor strength is 5/5 in Left and 4/5 RIght deltoid, bicep, tricep, grip,3/5 R hip flexor, knee extensors, ankle dorsiflexor and plantar flexor Pt apraxic, which may result in poor LE movement to command Sensory exam unable to assess due to fluent aphasia.  +word substitutions frequently- but kept shaking her head trying to come up with words  Musculoskeletal: Full range of motion in all 4 extremities. No joint swelling   Assessment/Plan: 1. Functional deficits which require 3+ hours per day of interdisciplinary therapy in a comprehensive inpatient rehab  setting.  Physiatrist is providing close team supervision and 24 hour management of active medical problems listed below.  Physiatrist and rehab team continue to assess barriers to discharge/monitor patient progress toward functional and medical goals  Care Tool:  Bathing    Body parts bathed by patient: Right arm, Left arm, Chest, Abdomen, Face, Right upper leg, Left upper leg   Body parts bathed by helper: Front perineal area, Buttocks, Right lower leg, Left lower leg     Bathing assist Assist Level: Moderate Assistance - Patient 50 - 74%     Upper Body Dressing/Undressing Upper body dressing   What is the patient wearing?: Button up shirt    Upper body assist Assist Level: Minimal Assistance - Patient > 75%    Lower Body Dressing/Undressing Lower body dressing      What is the patient wearing?: Incontinence brief, Pants     Lower body assist Assist for lower body dressing: Maximal Assistance - Patient 25 - 49%     Toileting Toileting    Toileting assist Assist for toileting: Maximal Assistance - Patient 25 - 49%     Transfers Chair/bed transfer  Transfers assist     Chair/bed transfer assist level: Moderate Assistance - Patient 50 - 74%     Locomotion Ambulation   Ambulation assist   Ambulation activity did not occur: Safety/medical concerns  Assist level: Minimal Assistance - Patient > 75% Assistive device: Parallel bars Max distance: 8   Walk 10 feet  activity   Assist  Walk 10 feet activity did not occur: Safety/medical concerns        Walk 50 feet activity   Assist Walk 50 feet with 2 turns activity did not occur: Safety/medical concerns         Walk 150 feet activity   Assist Walk 150 feet activity did not occur: Safety/medical concerns         Walk 10 feet on uneven surface  activity   Assist Walk 10 feet on uneven surfaces activity did not occur: Safety/medical concerns         Wheelchair     Assist Will  patient use wheelchair at discharge?: Yes Type of Wheelchair: Manual    Wheelchair assist level: Total Assistance - Patient < 25% Max wheelchair distance: 150    Wheelchair 50 feet with 2 turns activity    Assist        Assist Level: Maximal Assistance - Patient 25 - 49%   Wheelchair 150 feet activity     Assist      Assist Level: Total Assistance - Patient < 25%   Blood pressure (!) 100/54, pulse 64, temperature 97.7 F (36.5 C), resp. rate 16, height 5\' 4"  (1.626 m), weight 82 kg, SpO2 99 %.  Medical Problem List and Plan: 1.  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  Antithrombotics: -DVT/anticoagulation: Bilateral lower extremity Dopplers negative for DVT.  Eliquis             -antiplatelet therapy: N/A 3. Pain Management: Tylenol as needed. Complains of neck discomfort: will order kpad  11/25- denies pain- con't regimen   4. Mood: Provide emotional support             -antipsychotic agents: N/A 5. Neuropsych: This patient is capable of making decisions on her own behalf. 6. Skin/Wound Care: Routine skin checks Augmentin for cellulitis thru 11/18 7. Fluids/Electrolytes/Nutrition: Routine in and outs with follow-up chemistries 8.  Atrial fibrillation.  Cardizem 60 mg every 8 hours, Toprol-XL 100 mg daily.  11/24: HR is well controlled.  9.  Diabetes mellitus.  Hemoglobin A1c 6.4.  SSI.  Patient on Glucophage 500 mg daily prior to admission.  Resume as needed CBG (last 3)  Recent Labs    05/24/20 1623 05/24/20 2119 05/25/20 0620  GLUCAP 118* 129* 123*   11/25- BGs 118-129 yesterday - alittle better- con't regimen 10.Hypertension.  Monitor with increased mobility.  Patient on lisinopril 40 mg daily prior  to admission.  Resume as needed. Currently BP is soft.  Vitals:   05/24/20 1541 05/24/20 2028  BP: 108/64 (!) 100/54  Pulse:  64  Resp:  16  Temp:  97.7 F (36.5 C)  SpO2:  99%   11/25- BP controlled- a little soft, but no Sx's- con't regimen 11.  Hyperlipidemia.  Zetia 12.  Left lower extremity cellulitis.  Venous Doppler studies negative.  Complete course of Augmentin initiated 05/15/2020.  13.  Hypo K not on diuretics, may be nutritional , supplement x 2 d a repeat BMETlow normal will cont KCL at 40meq per day will increase supplementation due to potassium of 3.0  11/23 K+ normalized ,needed extra K+ due  to diuresis with lasix x 3d will give daily dose of 17meq and recheck on Friday  14.  Leukocytosis resolved 15.  Mild diastolic dysfunction, mod /severe tricuspid regurg, increased LE edema, chest x-ray shows pleural effusions diuresed for 3d 16. Constipation  11/25- LBM 2 days ago- will give sorbitol to help     LOS: 10 days A FACE TO FACE EVALUATION WAS PERFORMED  Chizuko Trine 05/25/2020, 10:21 AM

## 2020-05-26 ENCOUNTER — Inpatient Hospital Stay (HOSPITAL_COMMUNITY): Payer: Medicare Other | Admitting: Occupational Therapy

## 2020-05-26 ENCOUNTER — Inpatient Hospital Stay (HOSPITAL_COMMUNITY): Payer: Medicare Other | Admitting: Speech Pathology

## 2020-05-26 ENCOUNTER — Inpatient Hospital Stay (HOSPITAL_COMMUNITY): Payer: Medicare Other | Admitting: Physical Therapy

## 2020-05-26 DIAGNOSIS — K5901 Slow transit constipation: Secondary | ICD-10-CM

## 2020-05-26 DIAGNOSIS — E876 Hypokalemia: Secondary | ICD-10-CM

## 2020-05-26 DIAGNOSIS — E1165 Type 2 diabetes mellitus with hyperglycemia: Secondary | ICD-10-CM

## 2020-05-26 DIAGNOSIS — E871 Hypo-osmolality and hyponatremia: Secondary | ICD-10-CM

## 2020-05-26 LAB — URINALYSIS, ROUTINE W REFLEX MICROSCOPIC
Bilirubin Urine: NEGATIVE
Glucose, UA: NEGATIVE mg/dL
Ketones, ur: NEGATIVE mg/dL
Nitrite: POSITIVE — AB
Protein, ur: 100 mg/dL — AB
Specific Gravity, Urine: 1.018 (ref 1.005–1.030)
WBC, UA: >50 WBC/hpf — ABNORMAL HIGH (ref 0–5)
pH: 5 (ref 5.0–8.0)

## 2020-05-26 LAB — BASIC METABOLIC PANEL
Anion gap: 11 (ref 5–15)
BUN: 12 mg/dL (ref 8–23)
CO2: 21 mmol/L — ABNORMAL LOW (ref 22–32)
Calcium: 8.8 mg/dL — ABNORMAL LOW (ref 8.9–10.3)
Chloride: 101 mmol/L (ref 98–111)
Creatinine, Ser: 0.74 mg/dL (ref 0.44–1.00)
GFR, Estimated: >60 mL/min (ref >60)
Glucose, Bld: 104 mg/dL — ABNORMAL HIGH (ref 70–99)
Potassium: 3.8 mmol/L (ref 3.5–5.1)
Sodium: 133 mmol/L — ABNORMAL LOW (ref 135–145)

## 2020-05-26 LAB — GLUCOSE, CAPILLARY
Glucose-Capillary: 100 mg/dL — ABNORMAL HIGH (ref 70–99)
Glucose-Capillary: 113 mg/dL — ABNORMAL HIGH (ref 70–99)
Glucose-Capillary: 117 mg/dL — ABNORMAL HIGH (ref 70–99)
Glucose-Capillary: 138 mg/dL — ABNORMAL HIGH (ref 70–99)

## 2020-05-26 NOTE — Progress Notes (Signed)
Physical Therapy Session Note  Patient Details  Name: Sheila Ray MRN: [id number] Date of Birth: September 18, 1940  Today's Date: 05/26/2020 PT Individual Time: 0803-0900 PT Individual Time Calculation (min): 57 min   Short Term Goals: Week 2:  PT Short Term Goal 1 (Week 2): pt will move supine>< sit to L with min assist. PT Short Term Goal 2 (Week 2): pt will move sit>< stand with min assist consistently. PT Short Term Goal 3 (Week 2): pt will transfer bed>< wc with min assist 50% of attempts PT Short Term Goal 4 (Week 2): pt will perform gait with LRAD x 25' with min/mod assist  Skilled Therapeutic Interventions/Progress Updates: Pt presented in bed agreeable to therapy. Pt denies pain and was able to verbalize in full sentence that she felt she needed to use the bathroom. Pt performed supine to sit with modA but improved sequencing. PTA donned TED hose total A. Performed squat pivot to w/c modA and transported to toilet. Performed stand pivot to toilet with use of wall rail modA (+BM). PTA donned new brief and threaded pants for time management Performed STS with minA and RW to allow PTA to perform peri-care. Performed stand pivot with maxA and RWwith PTA providing MAX multimodal cues for initiation and placement of RLE. Pt then taken to sink and performed oral hygiene with set up. Pt then transported to rehab gym for energy conservation. Performed stand pivot to mat with modA for stand and maxA for turning due to pt unable to motor plan use of RLE. Participated in toe taps with RLE to to target on 4in step for forced use of RLE in addition to placing RLE backwards x 10. Pt also participated in alternating toe taps to 4in step for wt shifting to R. Participated in gait training with shopping cart 31f with pt able to appropriately advance RLE, demonstrate fair activation of TKE in stance phase moving forward as well as able to turn and step backwards with minA and verbal cues. Due to fatigue pt  performed squat pivot transfer to L with minA to return to w/c. Pt transported back to room and remained in w/c with belt alarm on, call bell within reach and needs met.      Therapy Documentation Precautions:  Precautions Precautions: Fall Restrictions Weight Bearing Restrictions: No General:   Vital Signs:     Therapy/Group: Individual Therapy  Konnar Ben Cammeron Greis, PTA  05/26/2020, 12:54 PM

## 2020-05-26 NOTE — Progress Notes (Signed)
Occupational Therapy Session Note  Patient Details  Name: Sheila Ray MRN: [id number] Date of Birth: [birth year]  Today's Date: 05/26/2020 OT Individual Time: 1000-1057 OT Individual Time Calculation (min): 57 min   Skilled Therapeutic Interventions/Progress Updates:    Pt greeted in the w/c with no c/o pain. Pt declining participation in all ADL tasks today but was agreeable to attempt toileting. Mod A initially for sit<stand in [place] where pt transferred to the elevated toilet, note that pt used both hands to boost herself into standing. She was able to assist with lowering clothing given HOH cuing to incorporate both hands and Min A. Pt provided with a few minutes to void and she stated "evidently not" and "no" a few times. Her brief was clean and she did not have a continent void. OT completed perihygiene to ensure cleanliness and also applied barrier cream due to redness on buttocks. Pt required Mod A to elevate clothing given the same assistance but was able to complete sit<stand from toilet with supervision. Note that pt exhibited some signs of anxiousness when asked to assist with her clothing, calming cues utilized to address. Worked on bimanual training, praxis, and trunk control while pt completed hand washing and brushing hair while semi perched in [place] in front of the sink. Pt required Min A for hand washing with manual cuing to increase functional use of the Rt hand. Pt initiated using both hands to brush her hair. Pt then returned to the w/c and for remainder of session focused on NMR of the Rt UE by donning pillowcases onto pillows in room. Pt scanned to the Rt side when pillowcase or pillow was presented, reaching laterally with the Rt UE. She was able to meet demands of task with increased time for motor planning and min cuing for Rt attention. At end of session pt remained sitting up in the w/c, left her with all needs within reach and safety belt fastened.    Therapy  Documentation Precautions:  Precautions Precautions: Fall Restrictions Weight Bearing Restrictions: No  ADL: ADL Grooming: Minimal assistance Where Assessed-Grooming: Sitting at sink, Wheelchair Upper Body Bathing: Moderate assistance Where Assessed-Upper Body Bathing: Shower Lower Body Bathing: Dependent Where Assessed-Lower Body Bathing: Shower Upper Body Dressing: Moderate assistance Where Assessed-Upper Body Dressing: Wheelchair Lower Body Dressing: Dependent Where Assessed-Lower Body Dressing: Wheelchair Toileting: Dependent Where Assessed-Toileting: Glass blower/designer: Dependent, Maximal assistance, Other (comment) (via stedy) Toilet Transfer Method: Other (comment) (via stedy) Science writer: Raised toilet seat Social research officer, government: Maximal assistance, Dependent Social research officer, government Method: Other (comment) (via stedy) Walk-In Shower Equipment: Transfer tub bench     Therapy/Group: Individual Therapy  Shervin Cypert A Amani Nodarse 05/26/2020, 12:58 PM

## 2020-05-26 NOTE — Progress Notes (Signed)
Physical Therapy Session Note  Patient Details  Name: Sheila Ray MRN: [id number] Date of Birth: [birth year]  Today's Date: 05/26/2020 PT Individual Time: 1305-1350 PT Individual Time Calculation (min): 45 min   Short Term Goals: Week 2:  PT Short Term Goal 1 (Week 2): pt will move supine>< sit to L with min assist. PT Short Term Goal 2 (Week 2): pt will move sit>< stand with min assist consistently. PT Short Term Goal 3 (Week 2): pt will transfer bed>< wc with min assist 50% of attempts PT Short Term Goal 4 (Week 2): pt will perform gait with LRAD x 25' with min/mod assist  Skilled Therapeutic Interventions/Progress Updates:    Pt received in [facility] and agreeable to PT. Pt does not complain of pain at this time. Pt transported to main therapy gym in [facility] for energy conservation and time management. Today's session focused on backwards ambulation/stepping due to noted breakdown in transfers associated with apraxia during this task.   In parallel bars: 1. Forward/Backward x2 -required min assist for RUE management and verbal cueing for R foot ~25% in both directions. Pt needs constant verbal cueing and tactile cueing in order to maintain upright posture due to forgetting B UE when ambulating backwards 2. Side Stepping both directions x1 -pt required min assist for RUE management and R LE placement when moving to her R. Pt able to move laterally to her L with CGA.   Repeated forward/backward ambulation with RW x2 (~84f) to assess carryover. Pt able to perform task with moderate cueing and min assist. Pt responds very well to tactile cueing on L hip in order to lean L and tactile cue on R anterior thigh in order to initiate step back with R foot. Pt needs reminders to maintain erect posture in order to perform backwards stepping. Additional time necessary for processing and completion of task.   [facility] to [facility] bed. Pt performed stand pivot transfer  with RW and min assist and moderate cueing. Pt has difficulty initiating lateral stepping when asked to step to external target, so PT assisted with foot placement and weight shifting (mod assist) to complete. Pt returned to supine with min assist for LE management and left with head of bed elevated, needs met, and call bell in reach.   ~6 sit<>stands performed throughout session with mod assist for full hip extension.   Therapy Documentation Precautions:  Precautions Precautions: Fall Restrictions Weight Bearing Restrictions: No Vital Signs: Therapy Vitals Temp: 97.6 F (36.4 C) Pulse Rate: 84 Resp: 17 BP: (!) 111/47 Patient Position (if appropriate): Lying Oxygen Therapy SpO2: 95 % O2 Device: Room Air   Therapy/Group: Individual Therapy  BGaylord Shih SPT 05/26/2020, 2:51 PM

## 2020-05-26 NOTE — Progress Notes (Signed)
[place] PHYSICAL MEDICINE & REHABILITATION PROGRESS NOTE   Subjective/Complaints: Patient seen sitting up in bed this morning.  She indicates she slept well overnight.  No reported issues overnight.  She is confused and tangential.  ROS: Limited by aphasia  Objective:   No results found. No results for input(s): WBC, HGB, HCT, PLT in the last 72 hours. Recent Labs    05/26/20 0522  NA 133*  K 3.8  CL 101  CO2 21*  GLUCOSE 104*  BUN 12  CREATININE 0.74  CALCIUM 8.8*    Intake/Output Summary (Last 24 hours) at 05/26/2020 1424 Last data filed at 05/26/2020 1349 Gross per 24 hour  Intake 420 ml  Output 1400 ml  Net -980 ml        Physical Exam: Vital Signs Blood pressure (!) 111/47, pulse 84, temperature 97.6 F (36.4 C), resp. rate 17, height 5\' 4"  (1.626 m), weight 82 kg, SpO2 95 %. Constitutional: No distress . Vital signs reviewed. HENT: Normocephalic.  Atraumatic. Eyes: EOMI. No discharge. Cardiovascular: No JVD.  Irregularly irregular. Respiratory: Normal effort.  No stridor.  Bilateral clear to auscultation. GI: Non-distended.  BS +. Skin: Warm and dry.  Intact. Psych: Normal mood.  Normal behavior. Musc: No edema in extremities.  No tenderness in extremities. Neuro: Alert and oriented x1 Expressive >receptive aphasia Motor: LUE/LLE: 5/5 proximal distal RUE/RLE: 4/5 proximal distal   Assessment/Plan: 1. Functional deficits which require 3+ hours per day of interdisciplinary therapy in a comprehensive inpatient rehab setting.  Physiatrist is providing close team supervision and 24 hour management of active medical problems listed below.  Physiatrist and rehab team continue to assess barriers to discharge/monitor patient progress toward functional and medical goals  Care Tool:  Bathing    Body parts bathed by patient: Right arm, Left arm, Chest, Abdomen, Face, Right upper leg, Left upper leg   Body parts bathed by helper: Front perineal area,  Buttocks, Right lower leg, Left lower leg     Bathing assist Assist Level: Moderate Assistance - Patient 50 - 74%     Upper Body Dressing/Undressing Upper body dressing   What is the patient wearing?: Button up shirt    Upper body assist Assist Level: Minimal Assistance - Patient > 75%    Lower Body Dressing/Undressing Lower body dressing      What is the patient wearing?: Incontinence brief, Pants     Lower body assist Assist for lower body dressing: Maximal Assistance - Patient 25 - 49%     Toileting Toileting    Toileting assist Assist for toileting: Maximal Assistance - Patient 25 - 49%     Transfers Chair/bed transfer  Transfers assist     Chair/bed transfer assist level: Moderate Assistance - Patient 50 - 74%     Locomotion Ambulation   Ambulation assist   Ambulation activity did not occur: Safety/medical concerns  Assist level: Minimal Assistance - Patient > 75% Assistive device: Parallel bars Max distance: 8   Walk 10 feet activity   Assist  Walk 10 feet activity did not occur: Safety/medical concerns        Walk 50 feet activity   Assist Walk 50 feet with 2 turns activity did not occur: Safety/medical concerns         Walk 150 feet activity   Assist Walk 150 feet activity did not occur: Safety/medical concerns         Walk 10 feet on uneven surface  activity   Assist Walk 10 feet  on uneven surfaces activity did not occur: Safety/medical concerns         Wheelchair     Assist Will patient use wheelchair at discharge?: Yes Type of Wheelchair: Manual    Wheelchair assist level: Total Assistance - Patient < 25% Max wheelchair distance: 150    Wheelchair 50 feet with 2 turns activity    Assist        Assist Level: Maximal Assistance - Patient 25 - 49%   Wheelchair 150 feet activity     Assist      Assist Level: Total Assistance - Patient < 25%   Blood pressure (!) 111/47, pulse 84,  temperature 97.6 F (36.4 C), resp. rate 17, height 5\' 4"  (1.626 m), weight 82 kg, SpO2 95 %.  Medical Problem List and Plan: 1.  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  Antithrombotics: -DVT/anticoagulation: Bilateral lower extremity Dopplers negative for DVT.  Eliquis             -antiplatelet therapy: N/A 3. Pain Management: Tylenol as needed. Complains of neck discomfort:   K pad  Appears controlled with meds/interventions on 11/26 4. Mood: Provide emotional support             -antipsychotic agents: N/A 5. Neuropsych: This patient is not capable of making decisions on her own behalf. 6. Skin/Wound Care: Routine skin checks Augmentin for cellulitis thru 11/18 7. Fluids/Electrolytes/Nutrition: Routine in and outs 8.  Atrial fibrillation.  Cardizem 60 mg every 8 hours, Toprol-XL 100 mg daily.   9.  Diabetes mellitus with hyperglycemia.  Hemoglobin A1c 6.4.  SSI.  Patient on Glucophage 500 mg daily prior to admission.  Resume as needed CBG (last 3)  Recent Labs    05/25/20 2133 05/26/20 0601 05/26/20 1144  GLUCAP 157* 100* 113*   Start elevated on 11/26 10.Hypertension.  Monitor with increased mobility.  Patient on lisinopril 40 mg daily prior to admission.  Resume as needed. Currently BP is soft.  Vitals:   05/26/20 0451 05/26/20 1414  BP: 111/62 (!) 111/47  Pulse: 71 84  Resp: 15 17  Temp: (!) 97.5 F (36.4 C) 97.6 F (36.4 C)  SpO2: 98% 95%   Relatively controlled on 11/26 11.  Hyperlipidemia.  Zetia 12.  Left lower extremity cellulitis.  Venous Doppler studies negative.    Completed course of Augmentin.  13.  Hypo K   Continue KCL, increased supplementation  Potassium 3.8 on 11/26, labs ordered for Monday 14.  Leukocytosis resolved 15.  Mild diastolic dysfunction, mod  /severe tricuspid regurg, increased LE edema, chest x-ray shows pleural effusions diuresed for 3 days 16. Constipation  Appears to be improving 17.  Hyponatremia  Sodium 132 on 11/26  Continue to monitor      LOS: 11 days A FACE TO FACE EVALUATION WAS PERFORMED  Areana Kosanke [name] 05/26/2020, 2:24 PM

## 2020-05-26 NOTE — Progress Notes (Signed)
Speech Language Pathology Daily Session Note  Patient Details  Name: Sheila Ray MRN: [id number] Date of Birth: 04-22-1941  Today's Date: 05/26/2020 SLP Individual Time: 8833-7445 SLP Individual Time Calculation (min): 26 min  Short Term Goals: Week 2: SLP Short Term Goal 1 (Week 2): Pt will follow 2-step commands with 50% accuracy provided Max A multimodal cues. SLP Short Term Goal 2 (Week 2): Pt will demonstrate ability to problem solve functional situations with Min A verbal/visual cues. SLP Short Term Goal 3 (Week 2): Pt will communicate at the phrase level to express basic wants and need with Min A multimodal cues. SLP Short Term Goal 4 (Week 2): Pt will name produce phrase level utterances with Min A multimodal cues for word finding strategies. SLP Short Term Goal 5 (Week 2): Pt will detect verbal and/or functional errors with Min A multimodal cues.  Skilled Therapeutic Interventions: Pt was seen for skilled ST targeting communication goals. SLP facilitated session with picture scene description tasks, during which she required fluctuating Min to Mod A levels of semantic cues to assist with word finding throughout. Pt left laying in bed with alarm set and needs within reach. Continue per current plan of care.          Pain Pain Assessment Pain Scale: 0-10 Pain Score: 0-No pain  Therapy/Group: Individual Therapy  Arbutus Leas 05/26/2020, 7:29 AM

## 2020-05-27 ENCOUNTER — Inpatient Hospital Stay (HOSPITAL_COMMUNITY): Payer: Medicare Other | Admitting: Physical Therapy

## 2020-05-27 ENCOUNTER — Inpatient Hospital Stay (HOSPITAL_COMMUNITY): Payer: Medicare Other

## 2020-05-27 ENCOUNTER — Inpatient Hospital Stay (HOSPITAL_COMMUNITY): Payer: Medicare Other | Admitting: Occupational Therapy

## 2020-05-27 DIAGNOSIS — I10 Essential (primary) hypertension: Secondary | ICD-10-CM

## 2020-05-27 DIAGNOSIS — N390 Urinary tract infection, site not specified: Secondary | ICD-10-CM

## 2020-05-27 LAB — URINE CULTURE

## 2020-05-27 LAB — GLUCOSE, CAPILLARY
Glucose-Capillary: 107 mg/dL — ABNORMAL HIGH (ref 70–99)
Glucose-Capillary: 104 mg/dL — ABNORMAL HIGH (ref 70–99)
Glucose-Capillary: 109 mg/dL — ABNORMAL HIGH (ref 70–99)
Glucose-Capillary: 112 mg/dL — ABNORMAL HIGH (ref 70–99)

## 2020-05-27 MED ORDER — NITROFURANTOIN MONOHYD MACRO 100 MG PO CAPS
100.0000 mg | ORAL_CAPSULE | Freq: Two times a day (BID) | ORAL | Status: AC
  Administered 2020-05-27 – 2020-06-03 (×14): 100 mg via ORAL
  Filled 2020-05-27 (×14): qty 1

## 2020-05-27 NOTE — Plan of Care (Signed)
Patient is alert, has expressive aphasia. Up to the bathroom with Stedy. Denies any pain. Tolerated medications whole. Fall precautions maintained. Problem: Consults Goal: RH STROKE PATIENT EDUCATION Description: See Patient Education module for education specifics  Outcome: Progressing   Problem: RH BOWEL ELIMINATION Goal: RH STG MANAGE BOWEL WITH ASSISTANCE Description: STG Manage Bowel with Assistance. Min Outcome: Progressing   Problem: RH BLADDER ELIMINATION Goal: RH STG MANAGE BLADDER WITH ASSISTANCE Description: STG Manage Bladder With Assistance Min Outcome: Progressing   Problem: RH SAFETY Goal: RH STG ADHERE TO SAFETY PRECAUTIONS W/ASSISTANCE/DEVICE Description: STG Adhere to Safety Precautions With Assistance/Device. Min Outcome: Progressing   Problem: RH COGNITION-NURSING Goal: RH STG USES MEMORY AIDS/STRATEGIES W/ASSIST TO PROBLEM SOLVE Description: STG Uses Memory Aids/Strategies With Assistance to Problem Solve. Mod Outcome: Progressing   Problem: RH KNOWLEDGE DEFICIT Goal: RH STG INCREASE KNOWLEDGE OF STROKE PROPHYLAXIS Description: Patient/caregiver will be able to describe plan to prevent further stroke including medications with cues/handouts Outcome: Progressing

## 2020-05-27 NOTE — Progress Notes (Signed)
Speech Language Pathology Daily Session Note  Patient Details  Name: EMAN RYNDERS MRN: [id number] Date of Birth: [birth year]  Today's Date: 05/27/2020 SLP Individual Time: 5110-2111 SLP Individual Time Calculation (min): 46 min  Short Term Goals: Week 2: SLP Short Term Goal 1 (Week 2): Pt will follow 2-step commands with 50% accuracy provided Max A multimodal cues. SLP Short Term Goal 2 (Week 2): Pt will demonstrate ability to problem solve functional situations with Min A verbal/visual cues. SLP Short Term Goal 3 (Week 2): Pt will communicate at the phrase level to express basic wants and need with Min A multimodal cues. SLP Short Term Goal 4 (Week 2): Pt will name produce phrase level utterances with Min A multimodal cues for word finding strategies. SLP Short Term Goal 5 (Week 2): Pt will detect verbal and/or functional errors with Min A multimodal cues.  Skilled Therapeutic Interventions: Pt was seen for skilled ST targeting cognition and communication goals. SLP facilitated session with picture card in which patient was asked to describe scene and ID problem/unsafe situation. Pt requiring consistent Mod A phonemic and semantic cues to assist with word finding during task. Pt able to independently ID unsafe scenario in picture with 40% accuracy, mod A cues increased accuracy to 75%. Pt speech with less episodes word finding impairment during informal conversation vs structured tasks that require increased cognitive load. Pt left in bed with alarm set and needs within reach. Cont ST POC.   Pain Pain Assessment Pain Scale: 0-10 Pain Score: 0-No pain  Therapy/Group: Individual Therapy  Dewaine Conger 05/27/2020, 1:40 PM

## 2020-05-27 NOTE — Progress Notes (Signed)
Physical Therapy Session Note  Patient Details  Name: Sheila Ray MRN: [id number] Date of Birth: 1940-10-23  Today's Date: 05/27/2020 PT Individual Time: 0920-1000 PT Individual Time Calculation (min): 40 min   Short Term Goals: Week 2:  PT Short Term Goal 1 (Week 2): pt will move supine>< sit to L with min assist. PT Short Term Goal 2 (Week 2): pt will move sit>< stand with min assist consistently. PT Short Term Goal 3 (Week 2): pt will transfer bed>< wc with min assist 50% of attempts PT Short Term Goal 4 (Week 2): pt will perform gait with LRAD x 25' with min/mod assist  Skilled Therapeutic Interventions/Progress Updates:   Pt received supine in bed and agreeable to PT. Supine>sit transfer with mod assist and cues for LE placement and improved use of the RUE to push in to sitting. PT assisted pt to perform upper and lower body dressing sitting EOB with min assist for threading RLE into pant leg.   Stand pivot transfer to [facility] with RW and min assist. Moderate cues for sequencing of RLE to perform posterior step. Pt able to initiated side step spontaneously without added support within context of transfer.    Pt transported to rehab gym. Gait training with RW to weave through 2 cones x 2 with min assist and min-mod cues for posture, AD management and sequencing with preparation in turn to sit. Min assist also provided by Pt for control of of AD in turns to improve positioning within RW. Pt then reporting need for BM.   Pt transported back to room. Stand pivot transfer to [facility] over toilet with RW and intermittent use of rails with min assist overall. Able to void bladder slightly per pt report, but unable to have bowel movement.   Pt returned to room and performed hand hygiene at sink with se tup assist. Pt then transferred to bed with stand pivot technique and min assist with BUE support on RW. Sit>supine completed with min assist for RLE management, and left supine in bed with call bell in  reach and all needs met.         Therapy Documentation Precautions:  Precautions Precautions: Fall Restrictions Weight Bearing Restrictions: No Pain: denies   Therapy/Group: Individual Therapy  [name] 05/27/2020, 12:25 PM

## 2020-05-27 NOTE — Progress Notes (Signed)
PHYSICAL MEDICINE & REHABILITATION PROGRESS NOTE   Subjective/Complaints: Patient seen sitting up in bed this morning.  She states she slept well overnight.  No reported issues overnight.  She perseverates on "clean up".  ROS: Limited by aphasia  Objective:   No results found. No results for input(s): WBC, HGB, HCT, PLT in the last 72 hours. Recent Labs    05/26/20 0522  NA 133*  K 3.8  CL 101  CO2 21*  GLUCOSE 104*  BUN 12  CREATININE 0.74  CALCIUM 8.8*    Intake/Output Summary (Last 24 hours) at 05/27/2020 1258 Last data filed at 05/27/2020 0900 Gross per 24 hour  Intake 570 ml  Output 1000 ml  Net -430 ml        Physical Exam: Vital Signs Blood pressure 110/60, pulse 72, temperature 97.7 F (36.5 C), temperature source Oral, resp. rate 18, height 5\' 4"  (1.626 m), weight 82 kg, SpO2 97 %. Constitutional: No distress . Vital signs reviewed. HENT: Normocephalic.  Atraumatic. Eyes: EOMI. No discharge. Cardiovascular: No JVD.  RRR. Respiratory: Normal effort.  No stridor.  Bilateral clear to auscultation. GI: Non-distended.  BS +. Skin: Warm and dry.  Intact. Psych: Normal mood.  Normal behavior. Musc: No edema in extremities.  No tenderness in extremities. Neuro: Alert  Expressive >receptive aphasia, stable Motor: LUE/LLE: 5/5 proximal distal RUE/RLE: 4-4+/5 proximal distal   Assessment/Plan: 1. Functional deficits which require 3+ hours per day of interdisciplinary therapy in a comprehensive inpatient rehab setting.  Physiatrist is providing close team supervision and 24 hour management of active medical problems listed below.  Physiatrist and rehab team continue to assess barriers to discharge/monitor patient progress toward functional and medical goals  Care Tool:  Bathing    Body parts bathed by patient: Right arm, Left arm, Chest, Abdomen, Front perineal area, Buttocks, Right upper leg, Left upper leg, Right lower leg, Left lower leg,  Face   Body parts bathed by helper: Front perineal area, Buttocks, Right lower leg, Left lower leg     Bathing assist Assist Level: Moderate Assistance - Patient 50 - 74%     Upper Body Dressing/Undressing Upper body dressing   What is the patient wearing?: Pull over shirt    Upper body assist Assist Level: Minimal Assistance - Patient > 75%    Lower Body Dressing/Undressing Lower body dressing      What is the patient wearing?: Incontinence brief, Pants     Lower body assist Assist for lower body dressing: Moderate Assistance - Patient 50 - 74%     Toileting Toileting    Toileting assist Assist for toileting: Maximal Assistance - Patient 25 - 49%     Transfers Chair/bed transfer  Transfers assist     Chair/bed transfer assist level: Moderate Assistance - Patient 50 - 74%     Locomotion Ambulation   Ambulation assist   Ambulation activity did not occur: Safety/medical concerns  Assist level: Minimal Assistance - Patient > 75% Assistive device: Parallel bars Max distance: 8   Walk 10 feet activity   Assist  Walk 10 feet activity did not occur: Safety/medical concerns        Walk 50 feet activity   Assist Walk 50 feet with 2 turns activity did not occur: Safety/medical concerns         Walk 150 feet activity   Assist Walk 150 feet activity did not occur: Safety/medical concerns         Walk 10 feet on  uneven surface  activity   Assist Walk 10 feet on uneven surfaces activity did not occur: Safety/medical concerns         Wheelchair     Assist Will patient use wheelchair at discharge?: Yes Type of Wheelchair: Manual    Wheelchair assist level: Total Assistance - Patient < 25% Max wheelchair distance: 150    Wheelchair 50 feet with 2 turns activity    Assist        Assist Level: Maximal Assistance - Patient 25 - 49%   Wheelchair 150 feet activity     Assist      Assist Level: Total Assistance -  Patient < 25%   Blood pressure 110/60, pulse 72, temperature 97.7 F (36.5 C), temperature source Oral, resp. rate 18, height 5\' 4"  (1.626 m), weight 82 kg, SpO2 97 %.  Medical Problem List and Plan: 1.  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  Antithrombotics: -DVT/anticoagulation: Bilateral lower extremity Dopplers negative for DVT.  Eliquis             -antiplatelet therapy: N/A 3. Pain Management: Tylenol as needed. Complains of neck discomfort:   K pad  Appears controlled with meds/interventions on 11/27 4. Mood: Provide emotional support             -antipsychotic agents: N/A 5. Neuropsych: This patient is not capable of making decisions on her own behalf. 6. Skin/Wound Care: Routine skin checks Augmentin for cellulitis thru 11/18 7. Fluids/Electrolytes/Nutrition: Routine in and outs 8.  Atrial fibrillation.  Cardizem 60 mg every 8 hours, Toprol-XL 100 mg daily.   9.  Diabetes mellitus with hyperglycemia.  Hemoglobin A1c 6.4.  SSI.  Patient on Glucophage 500 mg daily prior to admission.  Resume as needed CBG (last 3)  Recent Labs    05/26/20 2106 05/27/20 0609 05/27/20 1202  GLUCAP 138* 107* 104*   Relatively controlled on 11/27 10.Hypertension.  Monitor with increased mobility.  Patient on lisinopril 40 mg daily prior to admission.  Resume as needed. Currently BP is soft.  Vitals:   05/27/20 0533 05/27/20 0805  BP: 104/71 110/60  Pulse: 84 72  Resp: 18   Temp: 97.7 F (36.5 C)   SpO2: 97%    Controlled on 11/27 11.  Hyperlipidemia.  Zetia 12.  Left lower extremity cellulitis.  Venous Doppler studies negative.    Completed course of Augmentin.  13.  Hypo K   Continue KCL, increased supplementation  Potassium 3.8 on 11/26, labs ordered for Monday 14.  Leukocytosis  resolved 15.  Mild diastolic dysfunction, mod /severe tricuspid regurg, increased LE edema, chest x-ray shows pleural effusions diuresed for 3 days 16. Constipation  Appears to be improving 17.  Hyponatremia  Sodium 133 on 11/26  Continue to monitor  18.  Acute lower UTI  UA +  Urine culture with multiple species, will reorder  Empiric Macrobid started 11/27  LOS: 12 days A FACE TO FACE EVALUATION WAS PERFORMED  Felissa Blouch [name] 05/27/2020, 12:58 PM

## 2020-05-27 NOTE — Progress Notes (Signed)
Occupational Therapy Session Note  Patient Details  Name: MIRREN GEST MRN: [id number] Date of Birth: 1940/09/20  Today's Date: 05/27/2020 OT Individual Time: 1005-1100 OT Individual Time Calculation (min): 55 min    Short Term Goals: Week 2:  OT Short Term Goal 1 (Week 2): Patient will complete LB bathing/dressing with Mod A OT Short Term Goal 2 (Week 2): Patient will complete 2/3 toileting components with Mod A OT Short Term Goal 3 (Week 2): Patient will complete toilet transfer with use of LRAD and Mod A.  Skilled Therapeutic Interventions/Progress Updates:    Pt in bed to start session, agreeable to OOB to work on selfcare at the sink.  Min assist for supine to sit with mod assist for sit to stand from the bed during transfer to the wheelchair.  She exhibited expressive difficulties throughout session, but was able to follow directional cueing for sequencing bathing and dressing with increased consistency.  She needed min assist at times to assist the RUE for wringing out the washcloth.  She was able to was using the right hand however with supervision.  Mod assist for sit to stand when washing peri area and buttocks with mod assist for crossing her LEs and reaching her feet with bathing and dressing tasks.  Mod demonstrational cueing to start by dressing the RLE first when donning brief and pants.  She was able to donn her pullover shirt with min assist to pull down in the back.  Mod assist for brief and pants as well as gripper socks.  Therapist provided total assist for donning her TEDs.  Finished session with transfer back to the bed per pt request.  Mod assist for transfer from the wheelchair with min assist to transition to supine in order to lift the RLE in the bed.  Call button and phone in reach with bed alarm in place.    Therapy Documentation Precautions:  Precautions Precautions: Fall Restrictions Weight Bearing Restrictions: No   Pain: Pain Assessment Pain Scale:  Faces Pain Score: 0-No pain ADL: See Care Tool Section for some details of mobility and selfcare  Therapy/Group: Individual Therapy  Dazia Lippold OTR/L 05/27/2020, 12:34 PM

## 2020-05-27 NOTE — Progress Notes (Signed)
Physical Therapy Session Note  Patient Details  Name: Sheila Ray MRN: [id number] Date of Birth: 09-17-1940  Today's Date: 05/27/2020 PT Individual Time: 4166-0630 PT Individual Time Calculation (min): 27 min   Short Term Goals: Week 2:  PT Short Term Goal 1 (Week 2): pt will move supine>< sit to L with min assist. PT Short Term Goal 2 (Week 2): pt will move sit>< stand with min assist consistently. PT Short Term Goal 3 (Week 2): pt will transfer bed>< wc with min assist 50% of attempts PT Short Term Goal 4 (Week 2): pt will perform gait with LRAD x 25' with min/mod assist  Skilled Therapeutic Interventions/Progress Updates:    Pt received semi reclined in bed and agreeable to PT. Pt does not report pain at this time. Pt able to come to sitting on R EOB with head of bed elevated using bed features with mod assist under scapula and L hand in PT's hand. Sit>tand with mod assist from lowest bed height to RW, then stand pivot transfer with turn to R in order to sit in Derma. Pt continues to demonstrate much difficulty when turning as she forgets to move her R LE, and when asked to move it (despite tactile, verbal cueing, etc) becomes very frustrated and anxious. PT able to assist with moving R LE into place with max assist. Pt transported in [facility] total assist for time management and energy conservation.   Gait: -pt ambulated ~60f with rollator in main therapy gym with minimal distractions and performed one turn to her L with CGA. As patient becomes fatigued, she begins to say "okay" several times over indicating her need to sit down. Pt able to complete a turn to her L to sit down, but has difficulty bringing her RLE back and towards midline. PT able to assist RLE in place with max assist as patient is very apprehensive and does not want to bend her R knee in order to clear foot.  -pt ambulated ~169fwith rollator and CGA from hallway into room requiring her to navigate through a doorway and complete  a turn to her L. Once in room pt instructed to turn to her R and required similar assistance as above for RLE. Pt again stating "okay" several times due to fatigue. Pt able to bring R foot back and toward midline when told to stand up straight, then bring her feet together.  [facility] sitting up with call bell in reach, chair alarm on, and needs met.   Therapy Documentation Precautions:  Precautions Precautions: Fall Restrictions Weight Bearing Restrictions: No Vital Signs: Therapy Vitals Temp: 97.7 F (36.5 C) Temp Source: Oral Pulse Rate: 81 Resp: 16 BP: (!) 104/57 Patient Position (if appropriate): Lying Oxygen Therapy SpO2: 97 % O2 Device: Room Air Pain: Pain Assessment Pain Scale: 0-10 Pain Score: 0-No pain   Therapy/Group: Individual Therapy  BaGaylord ShihSPT 05/27/2020, 4:40 PM

## 2020-05-28 DIAGNOSIS — R739 Hyperglycemia, unspecified: Secondary | ICD-10-CM

## 2020-05-28 DIAGNOSIS — Z8679 Personal history of other diseases of the circulatory system: Secondary | ICD-10-CM

## 2020-05-28 DIAGNOSIS — N319 Neuromuscular dysfunction of bladder, unspecified: Secondary | ICD-10-CM

## 2020-05-28 DIAGNOSIS — I5032 Chronic diastolic (congestive) heart failure: Secondary | ICD-10-CM

## 2020-05-28 LAB — GLUCOSE, CAPILLARY
Glucose-Capillary: 102 mg/dL — ABNORMAL HIGH (ref 70–99)
Glucose-Capillary: 112 mg/dL — ABNORMAL HIGH (ref 70–99)
Glucose-Capillary: 92 mg/dL (ref 70–99)
Glucose-Capillary: 126 mg/dL — ABNORMAL HIGH (ref 70–99)

## 2020-05-28 NOTE — Progress Notes (Addendum)
[place] PHYSICAL MEDICINE & REHABILITATION PROGRESS NOTE   Subjective/Complaints: Patient seen sitting up in bed this AM.  She states she slept well overnight.  Discussed increased bladder scan frequencies with nursing.   ROS: Limited by aphasia.  Objective:   No results found. No results for input(s): WBC, HGB, HCT, PLT in the last 72 hours. Recent Labs    05/26/20 0522  NA 133*  K 3.8  CL 101  CO2 21*  GLUCOSE 104*  BUN 12  CREATININE 0.74  CALCIUM 8.8*    Intake/Output Summary (Last 24 hours) at 05/28/2020 1304 Last data filed at 05/28/2020 1240 Gross per 24 hour  Intake 360 ml  Output 1050 ml  Net -690 ml        Physical Exam: Vital Signs Blood pressure 103/77, pulse 69, temperature 97.8 F (36.6 C), resp. rate 18, height 5\' 4"  (1.626 m), weight 82 kg, SpO2 98 %.  Constitutional: No distress . Vital signs reviewed. HENT: Normocephalic.  Atraumatic. Eyes: EOMI. No discharge. Cardiovascular: No JVD.  RRR. Respiratory: Normal effort.  No stridor.  Bilateral clear to auscultation. GI: Non-distended.  BS +. Skin: Warm and dry.  Intact. Psych: Appears pleasantly confused. Musc: No edema in extremities.  No tenderness in extremities. Neuro: Alert Expressive >receptive aphasia, unchanged Motor: LUE/LLE: 5/5 proximal distal RUE/RLE: 4-4+/5 proximal distal   Assessment/Plan: 1. Functional deficits which require 3+ hours per day of interdisciplinary therapy in a comprehensive inpatient rehab setting.  Physiatrist is providing close team supervision and 24 hour management of active medical problems listed below.  Physiatrist and rehab team continue to assess barriers to discharge/monitor patient progress toward functional and medical goals  Care Tool:  Bathing    Body parts bathed by patient: Right arm, Left arm, Chest, Abdomen, Front perineal area, Buttocks, Right upper leg, Left upper leg, Right lower leg, Left lower leg, Face   Body parts bathed by  helper: Front perineal area, Buttocks, Right lower leg, Left lower leg     Bathing assist Assist Level: Moderate Assistance - Patient 50 - 74%     Upper Body Dressing/Undressing Upper body dressing   What is the patient wearing?: Pull over shirt    Upper body assist Assist Level: Minimal Assistance - Patient > 75%    Lower Body Dressing/Undressing Lower body dressing      What is the patient wearing?: Incontinence brief, Pants     Lower body assist Assist for lower body dressing: Moderate Assistance - Patient 50 - 74%     Toileting Toileting    Toileting assist Assist for toileting: Maximal Assistance - Patient 25 - 49%     Transfers Chair/bed transfer  Transfers assist     Chair/bed transfer assist level: Moderate Assistance - Patient 50 - 74%     Locomotion Ambulation   Ambulation assist   Ambulation activity did not occur: Safety/medical concerns  Assist level: Minimal Assistance - Patient > 75% Assistive device: Parallel bars Max distance: 8   Walk 10 feet activity   Assist  Walk 10 feet activity did not occur: Safety/medical concerns        Walk 50 feet activity   Assist Walk 50 feet with 2 turns activity did not occur: Safety/medical concerns         Walk 150 feet activity   Assist Walk 150 feet activity did not occur: Safety/medical concerns         Walk 10 feet on uneven surface  activity   Assist  Walk 10 feet on uneven surfaces activity did not occur: Safety/medical concerns         Wheelchair     Assist Will patient use wheelchair at discharge?: Yes Type of Wheelchair: Manual    Wheelchair assist level: Total Assistance - Patient < 25% Max wheelchair distance: 150    Wheelchair 50 feet with 2 turns activity    Assist        Assist Level: Maximal Assistance - Patient 25 - 49%   Wheelchair 150 feet activity     Assist      Assist Level: Total Assistance - Patient < 25%   Blood pressure  103/77, pulse 69, temperature 97.8 F (36.6 C), resp. rate 18, height 5\' 4"  (1.626 m), weight 82 kg, SpO2 98 %.  Medical Problem List and Plan: 1.  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  Antithrombotics: -DVT/anticoagulation: Bilateral lower extremity Dopplers negative for DVT.  Eliquis             -antiplatelet therapy: N/A 3. Pain Management: Tylenol as needed. Complains of neck discomfort:   K pad  Appears controlled with meds/interventions on 11/28 4. Mood: Provide emotional support             -antipsychotic agents: N/A 5. Neuropsych: This patient is not capable of making decisions on her own behalf. 6. Skin/Wound Care: Routine skin checks Augmentin for cellulitis thru 11/18 7. Fluids/Electrolytes/Nutrition: Routine in and outs 8.  Atrial fibrillation.  Cardizem 60 mg every 8 hours, Toprol-XL 100 mg daily.   9.  Diabetes mellitus with hyperglycemia.  Hemoglobin A1c 6.4.  SSI.  Patient on Glucophage 500 mg daily prior to admission.  Resume as needed CBG (last 3)  Recent Labs    05/27/20 2159 05/28/20 0602 05/28/20 1115  GLUCAP 112* 102* 112*   Relatively controlled on 11/28 10.Hypertension.  Monitor with increased mobility.  Patient on lisinopril 40 mg daily prior to admission.  Resume as needed.  Vitals:   05/28/20 0407 05/28/20 0835  BP: 110/73 103/77  Pulse: 66 69  Resp: 18   Temp: 97.8 F (36.6 C)   SpO2: 98%    Soft, but asymptomatic on 11/28 11.  Hyperlipidemia.  Zetia 12.  Left lower extremity cellulitis.  Venous Doppler studies negative.    Completed course of Augmentin.  13.  Hypo K   Continue KCL, increased supplementation  Potassium 3.8 on 11/26, labs ordered for tomorrow 14.  Leukocytosis resolved 15.  Mild diastolic dysfunction, mod /severe tricuspid regurg,  increased LE edema, chest x-ray shows pleural effusions diuresed for 3 days Filed Weights   05/15/20 1409  Weight: 82 kg   Repeat weights ordered 16. Constipation  Appears to be improving 17.  Hyponatremia  Sodium 133 on 11/26, labs ordered for tomorrow  Continue to monitor  18.  Acute lower UTI  UA +  Urine culture with >100K gram neg rods, sensitivities pending  Empiric Macrobid started 11/27 19. Neurogenic bladder:  I/O caths freq increased  LOS: 13 days A FACE TO FACE EVALUATION WAS PERFORMED  Sheila Ray Phenix 05/28/2020, 1:04 PM

## 2020-05-28 NOTE — Progress Notes (Signed)
Patient bladder scanned for 327 ml with c/o pressure Current I&O cath order states to cath for no void q8 hours . This nurse recommends increasing frequency of cath's to promote comfort for patient.

## 2020-05-29 ENCOUNTER — Inpatient Hospital Stay (HOSPITAL_COMMUNITY): Payer: Medicare Other | Admitting: Occupational Therapy

## 2020-05-29 ENCOUNTER — Inpatient Hospital Stay (HOSPITAL_COMMUNITY): Payer: Medicare Other | Admitting: Speech Pathology

## 2020-05-29 ENCOUNTER — Inpatient Hospital Stay (HOSPITAL_COMMUNITY): Payer: Medicare Other | Admitting: Physical Therapy

## 2020-05-29 LAB — GLUCOSE, CAPILLARY
Glucose-Capillary: 96 mg/dL (ref 70–99)
Glucose-Capillary: 118 mg/dL — ABNORMAL HIGH (ref 70–99)
Glucose-Capillary: 100 mg/dL — ABNORMAL HIGH (ref 70–99)
Glucose-Capillary: 124 mg/dL — ABNORMAL HIGH (ref 70–99)

## 2020-05-29 LAB — URINE CULTURE: Culture: >=100000 — AB

## 2020-05-29 NOTE — Progress Notes (Signed)
   05/29/20 2221  Urine Characteristics  Urinary Incontinence No  Urine Color Amber  Urine Appearance Clear  Urinary Interventions Bladder scan  Bladder Scan Volume (mL) 999 mL  Intermittent/Straight Cath (mL) 1200 mL  Intermittent Catheter Size 16  Post Void Cath Residual (mL) 0 mL (PVR -0)  Hygiene Peri care  patient scanned at 10:26 pm with a large amount of urine showing in the bladder over 999 ml  In and out cath done with 1200 ml of output patient is withholding urine. Prior shift taken to bathroom with some output noted but this output finding suggests need for cath schedule

## 2020-05-29 NOTE — Progress Notes (Signed)
[place] PHYSICAL MEDICINE & REHABILITATION PROGRESS NOTE   Subjective/Complaints:  No issues overnite, remains aphasic, fluent  With receptive deficits  ROS: Limited by aphasia.  Objective:   No results found. No results for input(s): WBC, HGB, HCT, PLT in the last 72 hours. No results for input(s): NA, K, CL, CO2, GLUCOSE, BUN, CREATININE, CALCIUM in the last 72 hours.  Intake/Output Summary (Last 24 hours) at 05/29/2020 1006 Last data filed at 05/29/2020 0527 Gross per 24 hour  Intake 240 ml  Output 1398 ml  Net -1158 ml        Physical Exam: Vital Signs Blood pressure 113/66, pulse 63, temperature (!) 97.3 F (36.3 C), temperature source Oral, resp. rate 18, height 5\' 4"  (1.626 m), weight 66 kg, SpO2 98 %.   General: No acute distress Mood and affect are appropriate Heart: Regular rate and rhythm no rubs murmurs or extra sounds Lungs: Clear to auscultation, breathing unlabored, no rales or wheezes Abdomen: Positive bowel sounds, soft nontender to palpation, nondistended Extremities: No clubbing, cyanosis, or edema Skin: No evidence of breakdown, no evidence of rash   Neuro: Alert Expressive >receptive aphasia, unchanged Motor: LUE/LLE: 5/5 proximal distal RUE/RLE: 4-4+/5 proximal distal   Assessment/Plan: 1. Functional deficits which require 3+ hours per day of interdisciplinary therapy in a comprehensive inpatient rehab setting.  Physiatrist is providing close team supervision and 24 hour management of active medical problems listed below.  Physiatrist and rehab team continue to assess barriers to discharge/monitor patient progress toward functional and medical goals  Care Tool:  Bathing    Body parts bathed by patient: Right arm, Left arm, Chest, Abdomen, Front perineal area, Buttocks, Right upper leg, Left upper leg, Right lower leg, Left lower leg, Face   Body parts bathed by helper: Front perineal area, Buttocks, Right lower leg, Left lower leg      Bathing assist Assist Level: Moderate Assistance - Patient 50 - 74%     Upper Body Dressing/Undressing Upper body dressing   What is the patient wearing?: Pull over shirt    Upper body assist Assist Level: Minimal Assistance - Patient > 75%    Lower Body Dressing/Undressing Lower body dressing      What is the patient wearing?: Incontinence brief, Pants     Lower body assist Assist for lower body dressing: Moderate Assistance - Patient 50 - 74%     Toileting Toileting    Toileting assist Assist for toileting: Maximal Assistance - Patient 25 - 49%     Transfers Chair/bed transfer  Transfers assist     Chair/bed transfer assist level: Moderate Assistance - Patient 50 - 74%     Locomotion Ambulation   Ambulation assist   Ambulation activity did not occur: Safety/medical concerns  Assist level: Minimal Assistance - Patient > 75% Assistive device: Parallel bars Max distance: 8   Walk 10 feet activity   Assist  Walk 10 feet activity did not occur: Safety/medical concerns        Walk 50 feet activity   Assist Walk 50 feet with 2 turns activity did not occur: Safety/medical concerns         Walk 150 feet activity   Assist Walk 150 feet activity did not occur: Safety/medical concerns         Walk 10 feet on uneven surface  activity   Assist Walk 10 feet on uneven surfaces activity did not occur: Safety/medical concerns         Wheelchair  Assist Will patient use wheelchair at discharge?: Yes Type of Wheelchair: Manual    Wheelchair assist level: Total Assistance - Patient < 25% Max wheelchair distance: 150    Wheelchair 50 feet with 2 turns activity    Assist        Assist Level: Maximal Assistance - Patient 25 - 49%   Wheelchair 150 feet activity     Assist      Assist Level: Total Assistance - Patient < 25%   Blood pressure 113/66, pulse 63, temperature (!) 97.3 F (36.3 C), temperature source Oral,  resp. rate 18, height 5\' 4"  (1.626 m), weight 66 kg, SpO2 98 %.  Medical Problem List and Plan: 1.  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  Antithrombotics: -DVT/anticoagulation: Bilateral lower extremity Dopplers negative for DVT.  Eliquis             -antiplatelet therapy: N/A 3. Pain Management: Tylenol as needed. Complains of neck discomfort:   K pad  Appears controlled with meds/interventions on 11/29 4. Mood: Provide emotional support             -antipsychotic agents: N/A 5. Neuropsych: This patient is not capable of making decisions on her own behalf. 6. Skin/Wound Care: Routine skin checks Augmentin for cellulitis thru 11/18 7. Fluids/Electrolytes/Nutrition: Routine in and outs 8.  Atrial fibrillation.  Cardizem 60 mg every 8 hours, Toprol-XL 100 mg daily.   9.  Diabetes mellitus with hyperglycemia.  Hemoglobin A1c 6.4.  SSI.  Patient on Glucophage 500 mg daily prior to admission.  Resume as needed CBG (last 3)  Recent Labs    05/28/20 1649 05/28/20 2120 05/29/20 0621  GLUCAP 92 126* 96  Well controlled 11/29 10.Hypertension.  Monitor with increased mobility.  Patient on lisinopril 40 mg daily prior to admission.  Resume as needed.  Vitals:   05/28/20 1955 05/29/20 0517  BP: 122/62 113/66  Pulse: 70 63  Resp: 18 18  Temp: 98.3 F (36.8 C) (!) 97.3 F (36.3 C)  SpO2: 96% 98%  controlled 11/29 11. Hyperlipidemia.  Zetia 12.  Left lower extremity cellulitis.  Venous Doppler studies negative.    Completed course of Augmentin.  13.  Hypo K   Continue KCL, increased supplementation  Potassium 3.8 on 11/26, labs ordered for tomorrow 14.  Leukocytosis resolved 15.  Mild diastolic dysfunction, mod /severe tricuspid regurg, increased LE edema, chest x-ray shows  pleural effusions diuresed for 3 days Filed Weights   05/15/20 1409 05/29/20 0500  Weight: 82 kg 66 kg   Repeat weights ? accuracy 16. Constipation  Appears to be improving 17.  Hyponatremia  Sodium 133 on 11/26, labs ordered for tomorrow  Continue to monitor  18.  Acute lower UTI  UA +  Urine culture with >100K gram neg rods, Citrobacter koseri S to nitrofurantoin  Empiric Macrobid started 11/27 19. Neurogenic bladder:  I/O caths freq increased  LOS: 14 days A FACE TO FACE EVALUATION WAS PERFORMED  Charlett Blake 05/29/2020, 10:06 AM

## 2020-05-29 NOTE — Progress Notes (Signed)
Physical Therapy Session Note  Patient Details  Name: Sheila Ray MRN: [id number] Date of Birth: 05/03/1941  Today's Date: 05/29/2020 PT Individual Time: 0905-1000 PT Individual Time Calculation (min): 55 min   Short Term Goals: Week 2:  PT Short Term Goal 1 (Week 2): pt will move supine>< sit to L with min assist. PT Short Term Goal 2 (Week 2): pt will move sit>< stand with min assist consistently. PT Short Term Goal 3 (Week 2): pt will transfer bed>< wc with min assist 50% of attempts PT Short Term Goal 4 (Week 2): pt will perform gait with LRAD x 25' with min/mod assist  Skilled Therapeutic Interventions/Progress Updates: Pt presented in bed agreeable to therapy. Pt denies pain at start of session. Performed supine to sit EOB with min/modA for truncal support. PTA donned TED hose, pants and shoes total A and pt performed STS from EOB with minA and mod verbal cues. Pt required modA to pull pants over hips. Pt reutnred to sitting and doffed button up top and donned new shirt with supervision and increased time. Performed stand pivot transfer to w/c to L with modA and increased time. PTA noted pt with improved awareness of RLE however continues to require mod/max cues to use RLE during pivot. PT taken to sink and performed oral hygiene with set up assist. Pt then transported to rehab gym and participated in gait training with grocery cart 98f x 2. Pt was able to consistently advance RLE and step through with minimal cues as well as step backwards with mod multimodal cues. At mat pt participated in toe taps to target focusing on RLE stepping "forwards and backwards" 2 x 5 with pt demonstrating more fluidity and accuracy to target. Pt then performed ambulatory transfer to w/c with modA for turns but with increased time pt able to place and step with RLE with verbal cues only. Pt transported back to room and remained in w/c at end of session with belt alarm on, call bell within reach and needs met.       Therapy Documentation Precautions:  Precautions Precautions: Fall Restrictions Weight Bearing Restrictions: No General:   Vital Signs: Therapy Vitals Temp: 98.2 F (36.8 C) Pulse Rate: (!) 56 Resp: 15 BP: 112/71 Patient Position (if appropriate): Lying Oxygen Therapy SpO2: 99 % O2 Device: Room Air Pain: Pain Assessment Pain Scale: 0-10 Pain Score: 0-No pain    Therapy/Group: Individual Therapy  Jaystin Mcgarvey  Avelardo Reesman, PTA  05/29/2020, 4:18 PM

## 2020-05-29 NOTE — Progress Notes (Signed)
Occupational Therapy Session Note  Patient Details  Name: Sheila Ray MRN: [id number] Date of Birth: [birth year]  Today's Date: 05/29/2020 OT Individual Time: 6063-0160 OT Individual Time Calculation (min): 43 min 32 minutes missed  Short Term Goals: Week 2:  OT Short Term Goal 1 (Week 2): Patient will complete LB bathing/dressing with Mod A OT Short Term Goal 2 (Week 2): Patient will complete 2/3 toileting components with Mod A OT Short Term Goal 3 (Week 2): Patient will complete toilet transfer with use of LRAD and Mod A.  Skilled Therapeutic Interventions/Progress Updates:    Pt greeted in the w/c, affect initially very bright, talking about going home next week. She declined bathing, stating she already washed up but requested to brush her hair and brush her teeth at the sink. To do these tasks in standing, sit<stand at sink completed with Mod A and manual cues for proper hand placement. Once standing, pt repeated "I don't know," appearing more anxious and concerned. Noted some increased Rt knee flexion compared to the Lt knee. OT handed her the hair brush but after a moment or so pt returned the brush to the sink and initiated sitting down. Pt required increased encouragement to participate from then on during session, reporting "back to bed" and "I'm just through." Pt required step by step cues and each grooming item handed to her while completing stated tasks w/c level as she would not scan for them on her own when cued. Bimanual training with folding washcloths, hand towels, and pillowcases, pt required increased time, encouragement, and min cuing for Rt attention. Pt still adamant in regards to returning to bed, agitated now, unable to be redirected to any more therapeutic tasks, stating "I just don't feel good," but denying nausea or pain. Heavy Mod A for stand pivot<bed using RW, pt needing assistance during power up with anterior weight shifting. Mod A for lateral scooting up towards  [place] before her shoes were doffed. Min A to elevate the Rt foot into bed. Pt reporting that she felt better now that she was lying down. Set her up with pillows to protect hemiplegic side with pt placing pillows onto floor. Left her with all needs within reach and bed alarm set, RN made aware of pt not feeling well. Per RN, pt with new UTI dx. Time missed due to pt refusal.    Therapy Documentation Precautions:  Precautions Precautions: Fall Restrictions Weight Bearing Restrictions: No Pain: Pain Assessment Pain Scale: Faces Pain Score: 0-No pain Faces Pain Scale: No hurt Pain Type: Acute pain ADL: ADL Grooming: Minimal assistance Where Assessed-Grooming: Sitting at sink, Wheelchair Upper Body Bathing: Moderate assistance Where Assessed-Upper Body Bathing: Shower Lower Body Bathing: Dependent Where Assessed-Lower Body Bathing: Shower Upper Body Dressing: Moderate assistance Where Assessed-Upper Body Dressing: Wheelchair Lower Body Dressing: Dependent Where Assessed-Lower Body Dressing: Wheelchair Toileting: Dependent Where Assessed-Toileting: Glass blower/designer: Dependent, Maximal assistance, Other (comment) (via stedy) Toilet Transfer Method: Other (comment) (via stedy) Science writer: Raised toilet seat Social research officer, government: Maximal assistance, Dependent Social research officer, government Method: Other (comment) (via stedy) Walk-In Shower Equipment: Transfer tub bench      Therapy/Group: Individual Therapy  Emiliano Welshans A Jovany Disano 05/29/2020, 12:24 PM

## 2020-05-29 NOTE — Progress Notes (Signed)
Speech Language Pathology Daily Session Note  Patient Details  Name: Sheila Ray MRN: [id number] Date of Birth: [birth year]  Today's Date: 05/29/2020 SLP Individual Time: 9826-4158 SLP Individual Time Calculation (min): 57 min  Short Term Goals: Week 2: SLP Short Term Goal 1 (Week 2): Pt will follow 2-step commands with 50% accuracy provided Max A multimodal cues. SLP Short Term Goal 2 (Week 2): Pt will demonstrate ability to problem solve functional situations with Min A verbal/visual cues. SLP Short Term Goal 3 (Week 2): Pt will communicate at the phrase level to express basic wants and need with Min A multimodal cues. SLP Short Term Goal 4 (Week 2): Pt will name produce phrase level utterances with Min A multimodal cues for word finding strategies. SLP Short Term Goal 5 (Week 2): Pt will detect verbal and/or functional errors with Min A multimodal cues.  Skilled Therapeutic Interventions: Pt was seen for skilled ST targeting speech and language goals. Pt read basic medication labels with 80% accuracy and Min A visual cues for tracking left to right and phonemic cues for corrections in decoding. During a paragraph level reading task, pt became frustrated, as she had increase in difficulty staying organized and tracking along the page, and increase in phonemic paraphasias. During structured word finding tasks in which SLP provided verbal description of common object, pt required Mod A semantic and occasionally orthographic cues for word finding and correct verbal productions of words. Pt left laying in bed with alarm set and needs within reach. Continue per current plan of care.          Pain Pain Assessment Pain Scale: 0-10 Pain Score: 0-No pain  Therapy/Group: Individual Therapy  Arbutus Leas 05/29/2020, 3:03 PM

## 2020-05-30 ENCOUNTER — Inpatient Hospital Stay (HOSPITAL_COMMUNITY): Payer: Medicare Other | Admitting: Physical Therapy

## 2020-05-30 ENCOUNTER — Inpatient Hospital Stay (HOSPITAL_COMMUNITY): Payer: Medicare Other | Admitting: Occupational Therapy

## 2020-05-30 ENCOUNTER — Ambulatory Visit: Payer: Medicare Other | Admitting: Cardiology

## 2020-05-30 ENCOUNTER — Inpatient Hospital Stay (HOSPITAL_COMMUNITY): Payer: Medicare Other | Admitting: Speech Pathology

## 2020-05-30 LAB — GLUCOSE, CAPILLARY
Glucose-Capillary: 101 mg/dL — ABNORMAL HIGH (ref 70–99)
Glucose-Capillary: 116 mg/dL — ABNORMAL HIGH (ref 70–99)
Glucose-Capillary: 106 mg/dL — ABNORMAL HIGH (ref 70–99)
Glucose-Capillary: 102 mg/dL — ABNORMAL HIGH (ref 70–99)

## 2020-05-30 MED ORDER — BETHANECHOL CHLORIDE 10 MG PO TABS
10.0000 mg | ORAL_TABLET | Freq: Three times a day (TID) | ORAL | Status: DC
  Administered 2020-05-30 – 2020-05-31 (×3): 10 mg via ORAL
  Filled 2020-05-30 (×3): qty 1

## 2020-05-30 NOTE — Progress Notes (Signed)
[place] PHYSICAL MEDICINE & REHABILITATION PROGRESS NOTE   Subjective/Complaints: Remains aphasic Required I/O cath at 0410a today   ROS: Limited by aphasia.  Objective:   No results found. No results for input(s): WBC, HGB, HCT, PLT in the last 72 hours. No results for input(s): NA, K, CL, CO2, GLUCOSE, BUN, CREATININE, CALCIUM in the last 72 hours.  Intake/Output Summary (Last 24 hours) at 05/30/2020 0809 Last data filed at 05/30/2020 0755 Gross per 24 hour  Intake 120 ml  Output 1650 ml  Net -1530 ml        Physical Exam: Vital Signs Blood pressure (!) 100/57, pulse 69, temperature 98.1 F (36.7 C), temperature source Oral, resp. rate 18, height 5\' 4"  (1.626 m), weight 63.8 kg, SpO2 97 %.   General: No acute distress Mood and affect are appropriate Heart: Regular rate and rhythm no rubs murmurs or extra sounds Lungs: Clear to auscultation, breathing unlabored, no rales or wheezes Abdomen: Positive bowel sounds, soft nontender to palpation, nondistended Extremities: No clubbing, cyanosis, or edema Skin: No evidence of breakdown, no evidence of rash   Neuro: Alert, cannot assess orientation due to word finding/substitution issues  Expressive >receptive aphasia, unchanged Motor: LUE/LLE: 5/5 proximal distal RUE/RLE: 4-4+/5 proximal distal   Assessment/Plan: 1. Functional deficits which require 3+ hours per day of interdisciplinary therapy in a comprehensive inpatient rehab setting.  Physiatrist is providing close team supervision and 24 hour management of active medical problems listed below.  Physiatrist and rehab team continue to assess barriers to discharge/monitor patient progress toward functional and medical goals  Care Tool:  Bathing  Bathing activity did not occur: Refused Body parts bathed by patient: Right arm, Left arm, Chest, Abdomen, Front perineal area, Buttocks, Right upper leg, Left upper leg, Right lower leg, Left lower leg, Face   Body  parts bathed by helper: Front perineal area, Buttocks, Right lower leg, Left lower leg     Bathing assist Assist Level: Moderate Assistance - Patient 50 - 74%     Upper Body Dressing/Undressing Upper body dressing   What is the patient wearing?: Pull over shirt    Upper body assist Assist Level: Minimal Assistance - Patient > 75%    Lower Body Dressing/Undressing Lower body dressing      What is the patient wearing?: Incontinence brief, Pants     Lower body assist Assist for lower body dressing: Moderate Assistance - Patient 50 - 74%     Toileting Toileting Toileting Activity did not occur Landscape architect and hygiene only): Refused  Toileting assist Assist for toileting: Maximal Assistance - Patient 25 - 49%     Transfers Chair/bed transfer  Transfers assist     Chair/bed transfer assist level: Moderate Assistance - Patient 50 - 74%     Locomotion Ambulation   Ambulation assist   Ambulation activity did not occur: Safety/medical concerns  Assist level: Minimal Assistance - Patient > 75% Assistive device: Parallel bars Max distance: 8   Walk 10 feet activity   Assist  Walk 10 feet activity did not occur: Safety/medical concerns        Walk 50 feet activity   Assist Walk 50 feet with 2 turns activity did not occur: Safety/medical concerns         Walk 150 feet activity   Assist Walk 150 feet activity did not occur: Safety/medical concerns         Walk 10 feet on uneven surface  activity   Assist Walk 10 feet on  uneven surfaces activity did not occur: Safety/medical concerns         Wheelchair     Assist Will patient use wheelchair at discharge?: Yes Type of Wheelchair: Manual    Wheelchair assist level: Total Assistance - Patient < 25% Max wheelchair distance: 150    Wheelchair 50 feet with 2 turns activity    Assist        Assist Level: Maximal Assistance - Patient 25 - 49%   Wheelchair 150 feet  activity     Assist      Assist Level: Total Assistance - Patient < 25%   Blood pressure (!) 100/57, pulse 69, temperature 98.1 F (36.7 C), temperature source Oral, resp. rate 18, height 5\' 4"  (1.626 m), weight 63.8 kg, SpO2 97 %.  Medical Problem List and Plan: 1.  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  Antithrombotics: -DVT/anticoagulation: Bilateral lower extremity Dopplers negative for DVT.  Eliquis             -antiplatelet therapy: N/A 3. Pain Management: Tylenol as needed. Complains of neck discomfort:   K pad  Appears controlled with meds/interventions on 11/29 4. Mood: Provide emotional support             -antipsychotic agents: N/A 5. Neuropsych: This patient is not capable of making decisions on her own behalf. 6. Skin/Wound Care: Routine skin checks Augmentin for cellulitis thru 11/18 7. Fluids/Electrolytes/Nutrition: Routine in and outs 8.  Atrial fibrillation.  Cardizem 60 mg every 8 hours, Toprol-XL 100 mg daily.   9.  Diabetes mellitus with hyperglycemia.  Hemoglobin A1c 6.4.  SSI.  Patient on Glucophage 500 mg daily prior to admission.  Resume as needed CBG (last 3)  Recent Labs    05/29/20 1617 05/29/20 2115 05/30/20 0544  GLUCAP 100* 124* 101*  Well controlled 11/30 10.Hypertension.  Monitor with increased mobility.  Patient on lisinopril 40 mg daily prior to admission.  Resume as needed.  Vitals:   05/29/20 2048 05/30/20 0427  BP: (!) 99/55 (!) 100/57  Pulse: (!) 54 69  Resp: 20 18  Temp: (!) 97.4 F (36.3 C) 98.1 F (36.7 C)  SpO2: 97% 97%  controlled 11/30 11. Hyperlipidemia.  Zetia 12.  Left lower extremity cellulitis.  Venous Doppler studies negative.    Completed course of Augmentin.  13.  Hypo K    Continue KCL, increased supplementation  Potassium 3.8 on 11/26, labs ordered for tomorrow 14.  Leukocytosis resolved 15.  Mild diastolic dysfunction, mod /severe tricuspid regurg, increased LE edema, chest x-ray shows pleural effusions diuresed for 3 days Filed Weights   05/29/20 0500 05/30/20 0500 05/30/20 0609  Weight: 66 kg 66.9 kg 63.8 kg   Repeat weights ? accuracy 16. Constipation  Appears to be improving 17.  Hyponatremia  Sodium 133 on 11/26, labs ordered for tomorrow  Continue to monitor  18.  Acute lower UTI  UA +  Urine culture with >100K gram neg rods, Citrobacter koseri S to nitrofurantoin  Empiric Macrobid started 11/27 19. Neurogenic bladder:  I/O caths freq increased, urinary retention , BP soft so avoid flomax, will trial low dose urecholine  LOS: 15 days A FACE TO FACE EVALUATION WAS PERFORMED  Charlett Blake 05/30/2020, 8:09 AM

## 2020-05-30 NOTE — Progress Notes (Signed)
Patient ID: Sheila Ray, female   DOB: 09-22-1940, 79 y.o.   MRN: [id number]  SW called patient son to follow up with OP recommendation for patient. Son informed sw that him and patient have spoken and are in agreement of SNF short term, due to patient still being I/O cath. sw explained process to son. Family will still take patient home if unable to receive bed offer or insurance authorization. SW will begin sending patient referral to patient and family area of [place], [place]. Will update on any offers or changes.   Mariano Colan, [place]

## 2020-05-30 NOTE — Progress Notes (Signed)
Speech Language Pathology Weekly Progress and Session Note  Patient Details  Name: Sheila Ray MRN: [id number] Date of Birth: August 23, 1940  Beginning of progress report period: May 23, 2020 End of progress report period: May 30, 2020  Today's Date: 05/30/2020 SLP Individual Time: 0730-0825 SLP Individual Time Calculation (min): 55 min  Short Term Goals: Week 2: SLP Short Term Goal 1 (Week 2): Pt will follow 2-step commands with 50% accuracy provided Max A multimodal cues. SLP Short Term Goal 1 - Progress (Week 2): Met SLP Short Term Goal 2 (Week 2): Pt will demonstrate ability to problem solve functional situations with Min A verbal/visual cues. SLP Short Term Goal 2 - Progress (Week 2): Met SLP Short Term Goal 3 (Week 2): Pt will communicate at the phrase level to express basic wants and need with Min A multimodal cues. SLP Short Term Goal 3 - Progress (Week 2): Met SLP Short Term Goal 4 (Week 2): Pt will name produce phrase level utterances with Min A multimodal cues for word finding strategies. SLP Short Term Goal 4 - Progress (Week 2): Progressing toward goal SLP Short Term Goal 5 (Week 2): Pt will detect verbal and/or functional errors with Min A multimodal cues. SLP Short Term Goal 5 - Progress (Week 2): Met    New Short Term Goals: Week 3: SLP Short Term Goal 1 (Week 3): STG=LTG due to remaining length of stay  Weekly Progress Updates: Pt has made functional gains and met 5 out of 5 short term goals this reporting period. Pt is currently requires Min-Mod assist for basic familiar tasks and functional communication due to mixed expressive/receptive aphasia and cognitive impairments impacting her emergent awareness, problem solving, attention, and short term recall. Pt also requires cues to follow directions due to receptive and attention deficits. Pt has demonstrated improved basic familiar problem solving, attention, word finding in informal communication exchanges and  to express her basic wants and needs, although increased semantic, phonemic, and orthrographic cues are required for word finding during most structured language tasks. Pt education is ongoing; no family has been present for ST sessions to date. Pt would continue to benefit from skilled ST while inpatient in order to maximize functional independence and reduce burden of care prior to discharge. Anticipate that pt will need 24/7 supervision at discharge in addition to [place] follow up at next level of care.     Intensity: Minumum of 1-2 x/day, 30 to 90 minutes Frequency: 3 to 5 out of 7 days Duration/Length of Stay: 06/06/20 Treatment/Interventions: Cognitive remediation/compensation;Cueing hierarchy;Speech/Language facilitation;Functional tasks;Patient/family education;Therapeutic Activities;Internal/external aids   Daily Session  Skilled Therapeutic Interventions: Pt was seen for skilled ST targeting cognitive-linguistic goals. SLP facilitated session with a basic grocery list/search activity. Pt able to self-formulate ~50% of words to record on a grocery list, otherwise requiring copy cues, which she still struggled to use accurately at times due to self-limiting frustration and difficulty attending/following directions from SLP - overall Mod A verbal, visual, and sometimes tactile cues required to break down processes and follow basic 1-step directions throughout task. Mod faded to Min A verbal cues also required for a problem solving strategy when searching a grocery ad for items on her list. Min A verbal cues required to selectively attend to tasks. Min-Mod A verbal cues also provided for word finding and clarification of needs and preferences throughout session. Pt left laying in bed with alarm set and needs within reach. Continue per current plan of care.  Pain Pain Assessment Pain Scale: 0-10 Pain Score: 0-No pain Faces Pain Scale: No hurt  Therapy/Group: Individual Therapy  Arbutus Leas 05/30/2020, 9:32 AM

## 2020-05-30 NOTE — Progress Notes (Signed)
Physical Therapy Session Note  Patient Details  Name: Sheila Ray MRN: [id number] Date of Birth: 26-Jan-1941  Today's Date: 05/30/2020 PT Individual Time: 1000-1045 and 1405-1500 PT Individual Time Calculation (min): 45 min and 55 min  Short Term Goals: Week 2:  PT Short Term Goal 1 (Week 2): pt will move supine>< sit to L with min assist. PT Short Term Goal 2 (Week 2): pt will move sit>< stand with min assist consistently. PT Short Term Goal 3 (Week 2): pt will transfer bed>< wc with min assist 50% of attempts PT Short Term Goal 4 (Week 2): pt will perform gait with LRAD x 25' with min/mod assist  Skilled Therapeutic Interventions/Progress Updates: Tx1: Pt presented in bed agreeable to therapy. Pt denies pain during session. Pt performed bed mobility with modA and use of bed features with increased time. PTA threaded pants and shoes total A for time management. Performed STS with modA from EOB and PTA pulled pants over hips. Pt returned to sitting and donned sweatshirt with minA. Pt then performed stand pivot transfer to w/c with minA and pt was able to manage RLE with decreased cues. Pt then performed oral hygiene at sink with set up. Pt then transported to rehab gym and performed block practice ambulatory transfers between w/c and 4-71f to mat. Pt was overall minA for ambulation but required modA for turns as pt required mod to max mulitmodal cues for placement of RLE when turning. PTA noted that pt did demonstrate some improving awareness and coordination of RLE. On final transfer to return to w/c pt performed with overall minA. Pt transported back to room at end of session and left with belt alarm on, call bell within reach and needs met.   Tx2: Pt presented in w/c agreeable to therapy. Pt denies pain, but does indicate fatigue. Extended rest breaks required between activities this session. Pt transported to rehab gym and participated in forward/backwards gait in parallel bars. In parallel  bars pt required increased time and effort to perform stand but was no more than modA to stand.  Pt performed forward/backwards 4 ft each x 6 bouts with pt demonstrating good control with backwards walking in parallel bars. Pt then participated in gait 130fx 2 with seated rest between bouts with RW with overall minA. Pt required increased time and effort to turn and step backwards to mat however did not require more physical assistance. Pt transported back to room and performed ambulatory transfer to bed with RW and minA. Performed sit to supine with bed flat with minA for RLE management. Pt repositioned to comfort and pt left in bed with bed alarm on, call bell within reach and needs met.      Therapy Documentation Precautions:  Precautions Precautions: Fall Restrictions Weight Bearing Restrictions: No General:   Vital Signs: Therapy Vitals Temp: 98.2 F (36.8 C) Pulse Rate: 83 Resp: 15 BP: 139/84 Patient Position (if appropriate): Sitting Oxygen Therapy SpO2: 96 % O2 Device: Room Air Pain: Pain Assessment Pain Score: 0-No pain   Therapy/Group: Individual Therapy  Isamar Wellbrock  Quincey Quesinberry, PTA  05/30/2020, 4:05 PM

## 2020-05-30 NOTE — Progress Notes (Signed)
Attempt to toilet patient conducted with 2 helpers to transfer to bathroom using steady lift. patient attempted to void in the toilet with negligible output collected in hat. In and out cath completed after a bladder scan showing 370 ml of urine per order. I &O cath resulted in 425 ml of cloudy amber colored urine with significant sediment and discharge at the end of the catheter assisted void. Pericare completed, Bed in, lowest position with the call light in reach. Safety ensured

## 2020-05-30 NOTE — Progress Notes (Signed)
Occupational Therapy Session Note  Patient Details  Name: Sheila Ray MRN: [id number] Date of Birth: 05-11-1941  Today's Date: 05/30/2020 OT Individual Time: 1115-1200 OT Individual Time Calculation (min): 45 min    Short Term Goals: Week 1:  OT Short Term Goal 1 (Week 1): patient will complete functional sit pivot or stand pivot transfers with mod A of one OT Short Term Goal 1 - Progress (Week 1): Met OT Short Term Goal 2 (Week 1): patient will complete UB bathing dressing with min A using right UE atleast 50% OT Short Term Goal 2 - Progress (Week 1): Met OT Short Term Goal 3 (Week 1): patient will complete LB bathing/dressing with mod A OT Short Term Goal 3 - Progress (Week 1): Progressing toward goal OT Short Term Goal 4 (Week 1): patient will complete 2/3 toileting components with mod A OT Short Term Goal 4 - Progress (Week 1): Progressing toward goal Week 2:  OT Short Term Goal 1 (Week 2): Patient will complete LB bathing/dressing with Mod A OT Short Term Goal 2 (Week 2): Patient will complete 2/3 toileting components with Mod A OT Short Term Goal 3 (Week 2): Patient will complete toilet transfer with use of LRAD and Mod A.     Skilled Therapeutic Interventions/Progress Updates:    Pt seen this session to focus on RUE coordination and shoulder strength. Pt taken to gym via wc and worked with clothes pins, dowel bar and finger ladder. She now has 120 AROM sh flex vs 90 on admission.  She has difficulty expressing herself and some with following directions.  Pt would often say no before she tried something but then could do the task fairly well.  Pt returned to room with belt alarm on and all needs met.   Therapy Documentation Precautions:  Precautions Precautions: Fall Restrictions Weight Bearing Restrictions: No  Pain: Pain Assessment Pain Scale: 0-10 Pain Score: 0-No pain Faces Pain Scale: No hurt Pain Type: Acute pain Pain Location: Leg Pain Orientation: Left Pain  Descriptors / Indicators: Aching Pain Frequency: Intermittent Pain Onset: On-going Patients Stated Pain Goal: 0 Pain Intervention(s): Medication (See eMAR) Multiple Pain Sites: No   Therapy/Group: Individual Therapy  [place] 05/30/2020, 1:09 PM

## 2020-05-30 NOTE — Progress Notes (Signed)
Patient ID: Sheila Ray, female   DOB: 18-Jun-1941, 79 y.o.   MRN: [id number]  [facility] unable to accept patient for OP follow up due to only accepting their patients currently.   [place], [place]

## 2020-05-30 NOTE — Progress Notes (Signed)
   05/30/20 0409  Urine Characteristics  Urinary Incontinence No  Urine Color Amber  Urine Appearance Cloudy;Sediment  Urinary Interventions Bladder scan  Bladder Scan Volume (mL) 545 mL  Intermittent/Straight Cath (mL) 450 mL  Intermittent Catheter Size 16  Post Void Cath Residual (mL) 0 mL  Hygiene Peri care  patient scanned according to q6 hour order for no void  Results above PVR 0

## 2020-05-30 NOTE — NC FL2 (Addendum)
[place] LEVEL OF CARE SCREENING TOOL     IDENTIFICATION  Patient Name: Sheila Ray Birthdate: 11/17/1940 Sex: female Admission Date (Current Location): 05/15/2020  [facility] and [place] Number:  [employer] and Address:  [facility], [place] [address] [place], [place] [address]      Provider Number:    Attending Physician Name and Address:  Charlett Blake, MD  Relative Name and Phone Number:  Elta Guadeloupe [phone]    Current Level of Care: SNF Recommended Level of Care: [place] Prior Approval Number:    Date Approved/Denied:   PASRR Number:   [id number]  Discharge Plan: SNF    Current Diagnoses: Patient Active Problem List   Diagnosis Date Noted  . Hyperglycemia   . Chronic diastolic congestive heart failure ([place])   . History of hypertension   . Neurogenic bladder   . Acute lower UTI   . Essential hypertension   . Hyponatremia   . Slow transit constipation   . Hypokalemia   . Controlled type 2 diabetes mellitus with hyperglycemia, without long-term current use of insulin ([place])   . 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 Acute ischemic stroke ([place]) 05/03/2020  . 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 Hypoxia   . Encephalopathy acute   . Atrial fibrillation ([place]) 08/19/2016  . OA (osteoarthritis) of knee 07/25/2014  . Hypothyroid 03/04/2013  . Stroke ([place]) 01/15/2013  . Osteoporosis 12/02/2012  . DM (diabetes mellitus) ([place]) 10/22/2012  . Hypertension associated with diabetes ([place]) 10/22/2012  . Hyperlipidemia associated with type 2 diabetes mellitus ([place]) 10/22/2012  . Vitamin D deficiency 09/13/2012  . Obesity (BMI 30-39.9) 09/13/2012    Orientation RESPIRATION BLADDER Height & Weight        Normal Incontinent, Continent (I/O Cath) Weight: 140 lb 9.6 oz (63.8 kg) Height:  5\' 4"  (162.6 cm)  BEHAVIORAL SYMPTOMS/MOOD NEUROLOGICAL BOWEL NUTRITION STATUS       Incontinent Diet  AMBULATORY STATUS COMMUNICATION OF NEEDS Skin   Limited Assist Verbally Normal                       Personal Care Assistance Level of Assistance  Bathing, Dressing, Feeding Bathing Assistance: Limited assistance   Dressing Assistance: Limited assistance     Functional Limitations Info             SPECIAL CARE FACTORS FREQUENCY  PT (By licensed PT), OT (By licensed OT), Bowel and bladder program, Speech therapy     PT Frequency: 5x a week OT Frequency: 5x a week     Speech Therapy Frequency: 5x a week      Contractures      Additional Factors Info                  Current Medications (05/30/2020):  This is the current hospital active medication list Current Facility-Administered Medications  Medication Dose Route Frequency Provider Last Rate Last Admin  . acetaminophen (TYLENOL) tablet 650 mg  650 mg Oral Q4H PRN Cathlyn Parsons, PA-C   650 mg at 05/25/20 2209   Or  . acetaminophen (TYLENOL) 160 MG/5ML solution 650 mg  650 mg Per Tube Q4H PRN Angiulli, Lavon Paganini, PA-C       Or  . acetaminophen (TYLENOL) suppository 650 mg  650 mg Rectal Q4H PRN Angiulli, Lavon Paganini, PA-C      . apixaban (ELIQUIS) tablet 5 mg  5 mg Oral BID Cathlyn Parsons, PA-C   5 mg at 05/30/20 8110  . bethanechol (URECHOLINE) tablet 10 mg  10 mg Oral TID Charlett Blake, MD      . diltiazem (CARDIZEM) tablet 60 mg  60 mg Oral Q8H Cathlyn Parsons, PA-C   60 mg at 05/30/20 0523  . ezetimibe (ZETIA) tablet 10 mg  10 mg Oral Daily Cathlyn Parsons, PA-C   10 mg at 05/30/20 3159  . Gerhardt's butt cream   Topical QID Cathlyn Parsons, PA-C   Given at 05/30/20 4585  . insulin aspart (novoLOG) injection 0-6 Units  0-6 Units Subcutaneous TID AC & HS Cathlyn Parsons, PA-C   1 Units at 05/25/20 2205  . ipratropium (ATROVENT) 0.06 % nasal spray 1 spray  1 spray Each Nare BID Cathlyn Parsons, PA-C   1 spray at 05/30/20 9292  . melatonin tablet 3 mg  3 mg Oral  QHS AngiulliLavon Paganini, PA-C   3 mg at 05/29/20 2259  . metoprolol succinate (TOPROL-XL) 24 hr tablet 100 mg  100 mg Oral Daily Cathlyn Parsons, PA-C   100 mg at 05/30/20 4462  . nitrofurantoin (macrocrystal-monohydrate) (MACROBID) capsule 100 mg  100 mg Oral Q12H Jamse Arn, MD   100 mg at 05/30/20 8638  . nystatin (MYCOSTATIN/NYSTOP) topical powder 1 application  1 application Topical BID Cathlyn Parsons, PA-C   1 application at 17/71/16 0940  . potassium chloride (KLOR-CON) CR tablet 10 mEq  10 mEq Oral Daily Charlett Blake, MD   10 mEq at 05/30/20 5790  . saccharomyces boulardii (FLORASTOR) capsule 250 mg  250 mg Oral BID Charlett Blake, MD   250 mg at 05/30/20 0941  . senna-docusate (Senokot-S) tablet 1 tablet  1 tablet Oral QHS PRN Cathlyn Parsons, PA-C   1 tablet at 05/28/20 2222     Discharge Medications: Please see discharge summary for a list of discharge medications.  Relevant Imaging Results:  Relevant Lab Results:   Additional Information    Dyanne Iha

## 2020-05-31 ENCOUNTER — Inpatient Hospital Stay (HOSPITAL_COMMUNITY): Payer: Medicare Other | Admitting: Occupational Therapy

## 2020-05-31 ENCOUNTER — Inpatient Hospital Stay (HOSPITAL_COMMUNITY): Payer: Medicare Other | Admitting: Speech Pathology

## 2020-05-31 ENCOUNTER — Inpatient Hospital Stay (HOSPITAL_COMMUNITY): Payer: Medicare Other

## 2020-05-31 ENCOUNTER — Inpatient Hospital Stay (HOSPITAL_COMMUNITY): Payer: Medicare Other | Admitting: Physical Therapy

## 2020-05-31 LAB — GLUCOSE, CAPILLARY
Glucose-Capillary: 97 mg/dL (ref 70–99)
Glucose-Capillary: 107 mg/dL — ABNORMAL HIGH (ref 70–99)
Glucose-Capillary: 99 mg/dL (ref 70–99)
Glucose-Capillary: 110 mg/dL — ABNORMAL HIGH (ref 70–99)
Glucose-Capillary: 112 mg/dL — ABNORMAL HIGH (ref 70–99)

## 2020-05-31 MED ORDER — FLEET ENEMA 7-19 GM/118ML RE ENEM: 1.0000 | ENEMA | Freq: Every day | RECTAL | Status: DC | PRN

## 2020-05-31 MED ORDER — BETHANECHOL CHLORIDE 10 MG PO TABS
10.0000 mg | ORAL_TABLET | Freq: Four times a day (QID) | ORAL | Status: DC
  Administered 2020-05-31 – 2020-06-01 (×6): 10 mg via ORAL
  Filled 2020-05-31 (×7): qty 1

## 2020-05-31 MED ORDER — SENNOSIDES-DOCUSATE SODIUM 8.6-50 MG PO TABS
1.0000 | ORAL_TABLET | Freq: Two times a day (BID) | ORAL | Status: DC
  Administered 2020-05-31 – 2020-06-02 (×4): 1 via ORAL
  Filled 2020-05-31 (×4): qty 1

## 2020-05-31 NOTE — Progress Notes (Signed)
Patient ID: Sheila Ray, female   DOB: [birth year], 79 y.o.   MRN: [id number]   Per physician patient can discharge to SNF before anticipated discharge date. Patient Sheila Ray began with [facility]. Clinicals faxed. Reference #[id number]

## 2020-05-31 NOTE — Progress Notes (Signed)
[facility] PHYSICAL MEDICINE & REHABILITATION PROGRESS NOTE   Subjective/Complaints: Remains aphasic, requiring ICP   ROS: Limited by aphasia.  Objective:   No results found. No results for input(s): WBC, HGB, HCT, PLT in the last 72 hours. No results for input(s): NA, K, CL, CO2, GLUCOSE, BUN, CREATININE, CALCIUM in the last 72 hours.  Intake/Output Summary (Last 24 hours) at 05/31/2020 0820 Last data filed at 05/31/2020 0700 Gross per 24 hour  Intake 240 ml  Output 1475 ml  Net -1235 ml        Physical Exam: Vital Signs Blood pressure 110/66, pulse 69, temperature 97.6 F (36.4 C), resp. rate 14, height 5' 4"  (1.626 m), weight 66.5 kg, SpO2 96 %.   General: No acute distress Mood and affect are appropriate Heart: Regular rate and rhythm no rubs murmurs or extra sounds Lungs: Clear to auscultation, breathing unlabored, no rales or wheezes Abdomen: Positive bowel sounds, soft nontender to palpation, nondistended Extremities: No clubbing, cyanosis, or edema Skin: No evidence of breakdown, no evidence of rash   Neuro: Alert, cannot assess orientation due to word finding/substitution issues  Expressive >receptive aphasia, unchanged Motor: LUE/LLE: 5/5 proximal distal RUE/RLE: 4-4+/5 proximal distal   Assessment/Plan: 1. Functional deficits which require 3+ hours per day of interdisciplinary therapy in a comprehensive inpatient rehab setting.  Physiatrist is providing close team supervision and 24 hour management of active medical problems listed below.  Physiatrist and rehab team continue to assess barriers to discharge/monitor patient progress toward functional and medical goals  Care Tool:  Bathing  Bathing activity did not occur: Refused Body parts bathed by patient: Right arm, Left arm, Chest, Abdomen, Front perineal area, Buttocks, Right upper leg, Left upper leg, Right lower leg, Left lower leg, Face   Body parts bathed by helper: Front perineal area,  Buttocks, Right lower leg, Left lower leg     Bathing assist Assist Level: Moderate Assistance - Patient 50 - 74%     Upper Body Dressing/Undressing Upper body dressing   What is the patient wearing?: Pull over shirt    Upper body assist Assist Level: Minimal Assistance - Patient > 75%    Lower Body Dressing/Undressing Lower body dressing      What is the patient wearing?: Incontinence brief, Pants     Lower body assist Assist for lower body dressing: Moderate Assistance - Patient 50 - 74%     Toileting Toileting Toileting Activity did not occur Landscape architect and hygiene only): Refused  Toileting assist Assist for toileting: Maximal Assistance - Patient 25 - 49%     Transfers Chair/bed transfer  Transfers assist     Chair/bed transfer assist level: Moderate Assistance - Patient 50 - 74%     Locomotion Ambulation   Ambulation assist   Ambulation activity did not occur: Safety/medical concerns  Assist level: Minimal Assistance - Patient > 75% Assistive device: Parallel bars Max distance: 8   Walk 10 feet activity   Assist  Walk 10 feet activity did not occur: Safety/medical concerns        Walk 50 feet activity   Assist Walk 50 feet with 2 turns activity did not occur: Safety/medical concerns         Walk 150 feet activity   Assist Walk 150 feet activity did not occur: Safety/medical concerns         Walk 10 feet on uneven surface  activity   Assist Walk 10 feet on uneven surfaces activity did not occur: Safety/medical concerns  Wheelchair     Assist Will patient use wheelchair at discharge?: Yes Type of Wheelchair: Manual    Wheelchair assist level: Total Assistance - Patient < 25% Max wheelchair distance: 150    Wheelchair 50 feet with 2 turns activity    Assist        Assist Level: Maximal Assistance - Patient 25 - 49%   Wheelchair 150 feet activity     Assist      Assist Level:  Total Assistance - Patient < 25%   Blood pressure 110/66, pulse 69, temperature 97.6 F (36.4 C), resp. rate 14, height 5' 4"  (1.626 m), weight 66.5 kg, SpO2 96 %.  Medical Problem List and Plan: 1.  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 Formulized individual treatment plan based on medical history, underlying problem and comorbidities.  Tent d/c Dec 7 2.  Antithrombotics: -DVT/anticoagulation: Bilateral lower extremity Dopplers negative for DVT.  Eliquis             -antiplatelet therapy: N/A 3. Pain Management: Tylenol as needed. Complains of neck discomfort:   K pad  Appears controlled with meds/interventions on 11/29 4. Mood: Provide emotional support             -antipsychotic agents: N/A 5. Neuropsych: This patient is not capable of making decisions on her own behalf. 6. Skin/Wound Care: Routine skin checks Augmentin for cellulitis thru 11/18 7. Fluids/Electrolytes/Nutrition: Routine in and outs 8.  Atrial fibrillation.  Cardizem 60 mg every 8 hours, Toprol-XL 100 mg daily.   9.  Diabetes mellitus with hyperglycemia.  Hemoglobin A1c 6.4.  SSI.  Patient on Glucophage 500 mg daily prior to admission.  Resume as needed CBG (last 3)  Recent Labs    05/30/20 2123 05/31/20 0619 05/31/20 0643  GLUCAP 102* 97 107*  Well controlled 11/30 10.Hypertension.  Monitor with increased mobility.  Patient on lisinopril 40 mg daily prior to admission.  Resume as needed.  Vitals:   05/30/20 1929 05/31/20 0350  BP: 126/67 110/66  Pulse: (!) 58 69  Resp: 15 14  Temp: 97.9 F (36.6 C) 97.6 F (36.4 C)  SpO2: 97% 96%  controlled 11/30 11.  Hyperlipidemia.  Zetia 12.  Left lower extremity cellulitis.  Venous Doppler studies negative.    Completed course of Augmentin.  13.  Hypo K   Continue KCL, increased supplementation  Potassium 3.8 on 11/26, labs ordered for tomorrow 14.  Leukocytosis resolved 15.  Mild diastolic dysfunction, mod /severe tricuspid regurg, increased LE edema, chest x-ray shows pleural effusions diuresed for 3 days Filed Weights   05/30/20 0500 05/30/20 0609 05/31/20 0454  Weight: 66.9 kg 63.8 kg 66.5 kg   Stable  16. Constipation  Last BM 11/29, adjust laxatives 17.  Hyponatremia  Sodium 133 on 11/26, labs ordered for tomorrow  Continue to monitor  18.  Acute lower UTI  UA +  Urine culture with >100K gram neg rods, Citrobacter koseri S to nitrofurantoin  Empiric Macrobid started 11/27 19. Neurogenic bladder:  I/O caths freq increased, urinary retention , BP soft so avoid flomax, will trial low dose urecholine increase to QID 12/1  LOS: 16 days A FACE TO FACE EVALUATION WAS PERFORMED  Luanna Salk Delva Derden 05/31/2020, 8:20 AM

## 2020-05-31 NOTE — Progress Notes (Signed)
Patient ID: Sheila Ray, female   DOB: 1941/04/13, 79 y.o.   MRN: [id number] Team Conference Report to Patient/Family  Team Conference discussion was reviewed with the patient and caregiver, including goals, any changes in plan of care and target discharge date.  Patient and caregiver express understanding and are in agreement.  The patient has a target discharge date of 06/06/20.  Dyanne Iha 05/31/2020, 3:20 PM

## 2020-05-31 NOTE — Progress Notes (Signed)
Occupational Therapy Session Note  Patient Details  Name: Sheila Ray MRN: [id number] Date of Birth: 04-25-1941  Today's Date: 05/31/2020 OT Individual Time: 4854-6270 OT Individual Time Calculation (min): 70 min    Short Term Goals: Week 2:  OT Short Term Goal 1 (Week 2): Patient will complete LB bathing/dressing with Mod A OT Short Term Goal 2 (Week 2): Patient will complete 2/3 toileting components with Mod A OT Short Term Goal 3 (Week 2): Patient will complete toilet transfer with use of LRAD and Mod A.  Skilled Therapeutic Interventions/Progress Updates:    Pt received supine with no c/o pain, agreeable to shower. Expressive and receptive aphasia obvious throughout session with inconsistent yes/no, as well as pt benefiting from visual demonstration to follow commands. Pt completed bed mobility with mod A to come EOB. Mod A squat pivot transfer EOB> w/c. Stand pivot transfer from w/c to the TTB was unsafe with pt prematurely sitting and landing on edge of bench, requiring heavy max A to be scooted back safely. Pt completed bathing seated with min cueing for initiation/thoroughness of UB and mod A for LB- to reach distal LE and bottom. Demo required to follow command of washing her bottom. Pt dried off following and completed mod A stand pivot transfer to the w/c. At the sink pt initiated oral care and required min A to apply toothpaste. Pt completed UB dressing with min A. Mod A to don pants, to thread RLE and then to pull up in standing. Pt was taken to the therapy gym. BITS used for visual scanning and BUE functional reaching, requiring min facilitation on both R and L for shoulder flexion to 100 degrees. Pt was taken back to her room and she completed toileting with mod A, stand pivot with RW. Heavy cueing for technique. Pt returned to bed and was left supine with all needs met, bed alarm set.   Therapy Documentation Precautions:  Precautions Precautions: Fall Restrictions Weight  Bearing Restrictions: No  Therapy/Group: Individual Therapy  Curtis Sites 05/31/2020, 7:24 AM

## 2020-05-31 NOTE — Progress Notes (Signed)
Occupational Therapy Session Note  Patient Details  Name: Sheila Ray MRN: [id number] Date of Birth: [birth year]  Today's Date: 05/31/2020 OT Individual Time: 1418-1430 OT Individual Time Calculation (min): 12 min  and Today's Date: 05/31/2020 OT Missed Time: 33 Minutes Missed Time Reason: Patient fatigue;Patient unwilling/refused to participate without medical reason   Short Term Goals: Week 2:  OT Short Term Goal 1 (Week 2): Patient will complete LB bathing/dressing with Mod A OT Short Term Goal 1 - Progress (Week 2): Met OT Short Term Goal 2 (Week 2): Patient will complete 2/3 toileting components with Mod A OT Short Term Goal 2 - Progress (Week 2): Met OT Short Term Goal 3 (Week 2): Patient will complete toilet transfer with use of LRAD and Mod A. OT Short Term Goal 3 - Progress (Week 2): Met  Skilled Therapeutic Interventions/Progress Updates:    Treatment session with focus on activity tolerance and endurance.  Pt received supine in bed asleep.  Pt awakened to name, opening eyes and reporting "I'm tired".  Pt initially agreeable to engaging in task in room from EOB.  However upon therapist return with items, pt asleep again and upon awakening pt stated "I'd really rather sleep."  Therapist educated on purpose of therapy session and encouraged pt to participate with pt again refusing.  Pt remained semi-reclined in bed with eyes closed and all needs in reach.  Therapy Documentation Precautions:  Precautions Precautions: Fall Restrictions Weight Bearing Restrictions: No General: General OT Amount of Missed Time: 33 Minutes Vital Signs: Therapy Vitals Temp: 97.7 F (36.5 C) Pulse Rate: (!) 106 Resp: 14 BP: 102/63 Patient Position (if appropriate): Lying Oxygen Therapy SpO2: 98 % O2 Device: Room Air Pain: Pain Assessment Pain Scale: 0-10 Pain Score: 0-No pain   Therapy/Group: Individual Therapy  Simonne Come 05/31/2020, 4:41 PM

## 2020-05-31 NOTE — Progress Notes (Signed)
Occupational Therapy Weekly Progress Note  Patient Details  Name: Sheila Ray MRN: [id number] Date of Birth: Nov 10, 1940  Beginning of progress report period: May 24, 2020 End of progress report period: May 31, 2020   Patient has met 3 of 3 short term goals. Pt has made good progress this reporting period. She is min A to don UB clothing and mod A to don LB. Pt completes transfers at a mod A level. Her R UE has improved with functional participation in tasks and she is able to functionally use it for self care. Pt's POC is potentially switching to SNF d/c 2/2 cath requirements at d/c, but pt remains motivated to participate in therapy.   Patient continues to demonstrate the following deficits: muscle weakness, decreased cardiorespiratoy endurance, abnormal tone and decreased motor planning, decreased initiation, decreased attention, decreased awareness, decreased problem solving, decreased safety awareness and delayed processing and decreased sitting balance, decreased standing balance, decreased postural control and decreased balance strategies and therefore will continue to benefit from skilled OT intervention to enhance overall performance with BADL and Reduce care partner burden.  Patient progressing toward long term goals..  Continue plan of care.  OT Short Term Goals Week 2:  OT Short Term Goal 1 (Week 2): Patient will complete LB bathing/dressing with Mod A OT Short Term Goal 1 - Progress (Week 2): Met OT Short Term Goal 2 (Week 2): Patient will complete 2/3 toileting components with Mod A OT Short Term Goal 2 - Progress (Week 2): Met OT Short Term Goal 3 (Week 2): Patient will complete toilet transfer with use of LRAD and Mod A. OT Short Term Goal 3 - Progress (Week 2): Met Week 3:  OT Short Term Goal 1 (Week 3): STG= LTG d/t ELOS   Therapy Documentation Precautions:  Precautions Precautions: Fall Restrictions Weight Bearing Restrictions: No   Therapy/Group:  Individual Therapy  Curtis Sites 05/31/2020, 10:29 AM

## 2020-05-31 NOTE — Progress Notes (Signed)
Speech Language Pathology Daily Session Note  Patient Details  Name: Sheila Ray MRN: [id number] Date of Birth: 12-18-1940  Today's Date: 05/31/2020 SLP Individual Time: 4210-3128 SLP Individual Time Calculation (min): 41 min  Short Term Goals: Week 3: SLP Short Term Goal 1 (Week 3): STG=LTG due to remaining length of stay  Skilled Therapeutic Interventions: Pt was seen for skilled ST targeting communication goals. SLP facilitated session with a sentence completion task in which pt had to select correct word from field of 3 to finish a sentence. Pt completed this task with ~80% accuracy and Min A verbal cues/feedback for awareness of errors. Pt required increased Mod A semantic and occasionally phonemic cues to verbalize opposites (ex: up and down). Pt has more success when reading at the phase and sentence level when the text for her to read is removed from page - written in isolation in comparison to on a worksheet with other sentences/visual distractions. Pt left laying in bed with alarm set and needs within reach. Continue per current plan of care.          Pain Pain Assessment Pain Scale: 0-10 Pain Score: 0-No pain  Therapy/Group: Individual Therapy  Arbutus Leas 05/31/2020, 7:20 AM

## 2020-05-31 NOTE — Progress Notes (Signed)
Physical Therapy Weekly Progress Note  Patient Details  Name: Sheila Ray MRN: [id number] Date of Birth: 04/07/1941  Beginning of progress report period: May 24, 2020 End of progress report period: May 31, 2020   Patient has met 4 of 4 short term goals.  Pt has made good progression towards goals during this current course of therapy. Pt with improving awareness of RLE and has become more consistently minA with transfers and gait although can be modA for turns with fatigue. Pt continues to demonstrate significant deficits in endurance limiting ambulation distance but demonstrating improvement in RLE function and movement. Pt continues to require assistance for RLE management and some truncal support for bed mobility but continues to improve.   Patient continues to demonstrate the following deficits muscle weakness and muscle paralysis, decreased cardiorespiratoy endurance and decreased attention, decreased awareness, decreased problem solving and decreased safety awareness and therefore will continue to benefit from skilled PT intervention to increase functional independence with mobility.  Patient progressing toward long term goals..  Continue plan of care.  PT Short Term Goals Week 2:  PT Short Term Goal 1 (Week 2): pt will move supine>< sit to L with min assist. PT Short Term Goal 1 - Progress (Week 2): Met PT Short Term Goal 2 (Week 2): pt will move sit>< stand with min assist consistently. PT Short Term Goal 2 - Progress (Week 2): Met PT Short Term Goal 3 (Week 2): pt will transfer bed>< wc with min assist 50% of attempts PT Short Term Goal 3 - Progress (Week 2): Met PT Short Term Goal 4 (Week 2): pt will perform gait with LRAD x 25' with min/mod assist PT Short Term Goal 4 - Progress (Week 2): Met Week 3:  PT Short Term Goal 1 (Week 3): STG=LTG due to ELOS  Skilled Therapeutic Interventions/Progress Updates:  Ambulation/gait training;Discharge planning;Functional mobility  training;Psychosocial support;Therapeutic Activities;Visual/perceptual remediation/compensation;Balance/vestibular training;Disease management/prevention;Neuromuscular re-education;Skin care/wound management;Therapeutic Exercise;Wheelchair propulsion/positioning;UE/LE Strength taining/ROM;Splinting/orthotics;Pain management;DME/adaptive equipment instruction;Cognitive remediation/compensation;Community reintegration;Functional electrical stimulation;Patient/family education;Stair training;UE/LE Coordination activities   Therapy Documentation Precautions:  Precautions Precautions: Fall Restrictions Weight Bearing Restrictions: No   Therapy/Group: Individual Therapy  Rosita DeChalus 05/31/2020, 7:55 AM

## 2020-05-31 NOTE — Progress Notes (Signed)
Physical Therapy Session Note  Patient Details  Name: Sheila Ray MRN: [id number] Date of Birth: 1940-10-12  Today's Date: 05/31/2020 PT Individual Time: 1100-1200 PT Individual Time Calculation (min): 60 min   Short Term Goals: Week 2:  PT Short Term Goal 1 (Week 2): pt will move supine>< sit to L with min assist. PT Short Term Goal 2 (Week 2): pt will move sit>< stand with min assist consistently. PT Short Term Goal 3 (Week 2): pt will transfer bed>< wc with min assist 50% of attempts PT Short Term Goal 4 (Week 2): pt will perform gait with LRAD x 25' with min/mod assist   Skilled Therapeutic Interventions/Progress Updates: Pt presented in bed agreeable to therapy. Pt denies pain during session. Performed supine to sit with use of bed features and minA. PTA donned shoes for time management. Per schedule pt ready for timed toileting. Performed ambulatory transfer to w/c with RW and minA and pt transported to toilet. Performed stand pivot transfer using wall rail and modA to toilet with pt requiring maxA for LB clothing management (+BM). Pt stood with RW to allow PTA to perform peri-care to ensure cleanliness. Pt then performed stand pivot to w/c with minA and RW. Hand hygiene performed at sink w/c level. Pt then transported to rehab gym and practiced forward/backwards/sidestepping in parallel bars for R NMR and forced use of RLE. Pt required significant encouragement to attempt sidestepping however pt ultimately performed. PTA attempted to use colored dots to increase abduction with side steps to R however pt unable to successfully perform. Pt was able to side step to R x57f but with very shortened step length. Performed forward/backwards [id number] and side stepping [id number]. Pt then participated in gait training with RW [id number] with minA and seated break between bouts. Pt demonstrated fair mechanics of RLE and was able to advance RLE with decreased cues. Pt transported back to room at end of  session and performed ambulatory transfer to bed with RW and minA. Pt returned to supine with bed flat with minA for RLE management and use of bed rails. Pt repositioned to comfort and left with bed alarm on, call bell within reach and needs met.       Therapy Documentation Precautions:  Precautions Precautions: Fall Restrictions Weight Bearing Restrictions: No General:   Vital Signs: Therapy Vitals Temp: 97.7 F (36.5 C) Pulse Rate: (!) 106 Resp: 14 BP: 102/63 Patient Position (if appropriate): Lying Oxygen Therapy SpO2: 98 % O2 Device: Room Air Pain: Pain Assessment Pain Scale: 0-10 Pain Score: 0-No pain   Therapy/Group: Individual Therapy  Murtaza Shell  Alycea Segoviano, PTA  05/31/2020, 3:48 PM

## 2020-05-31 NOTE — Progress Notes (Signed)
Patient ID: Sheila Ray, female   DOB: December 07, 1940, 79 y.o.   MRN: [id number]   SW received follow up from [facility] in reference to pt's paper referral that was faxed. Facility has bed available and would like to plan for possible transition on 05/07/2020. Facility requesting FL2 and begin auth on Monday. SW will inform family this evening.   [place], [place]

## 2020-05-31 NOTE — Patient Care Conference (Signed)
Inpatient RehabilitationTeam Conference and Plan of Care Update Date: 05/31/2020   Time: 10:23 AM    Patient Name: Sheila Ray      Medical Record Number: [id number]  Date of Birth: Aug 06, 1940 Sex: Female         Room/Bed: 4W10C/4W10C-01 Payor Info: Payor: Theme park manager MEDICARE / Plan: [employer]HC MEDICARE / Product Type: *No Product type* /    Admit Date/Time:  05/15/2020  2:03 PM  Primary Diagnosis:  Left middle cerebral artery stroke [facility])  Hospital Problems: Principal Problem:   Left middle cerebral artery stroke ([place]) Active Problems:   Hyponatremia   Slow transit constipation   Hypokalemia   Controlled type 2 diabetes mellitus with hyperglycemia, without long-term current use of insulin (HCC)   Acute lower UTI   Essential hypertension   Hyperglycemia   Chronic diastolic congestive heart failure ([place])   History of hypertension   Neurogenic bladder    Expected Discharge Date: Expected Discharge Date: 06/06/20  Team Members Present: Physician leading conference: Dr. Alysia Penna Care Coodinator Present: Dorien Chihuahua, RN, BSN, CRRN;Christina [place], [place] Nurse Present: Other (comment) Susie Cassette, RN) PT Present: Phylliss Bob, PTA OT Present: Laverle Hobby, OT SLP Present: Jettie Booze, CF-SLP PPS Coordinator present : Ileana Ladd, Burna Mortimer, SLP     Current Status/Progress Goal Weekly Team Focus  Bowel/Bladder   pt incontinent of bowel and bladder in and out cath q6 for no void with sedimented urine on macrobid treatment for uti  become continent of b and b  Continue in and out cath q6 for no void and q2 toileting for bowel   Swallow/Nutrition/ Hydration             ADL's   min UB self care, mod LB and toilet transfers  CS/min A  ADL training, functional mobilty, RUE NMR, pt fam educ   Mobility   modA bed mobility, modA stand pivot, improving awareness of RLE, modA gait with RW  CGA transfers; WC supervision VS. Household ambulation CGA  R  NMR, transfers, dynamic standing balance, gait, family ed   Communication   Min-Mod A phrase and sentence level expression and word finding strategies (more difficult in sturctured tasks), Min-Mod A comprehension, mostly requires SLP to break down into 1-step vs 2 or multistep directions  Min  expressing basic wants and needs, word finding, awareness and correction of verbal errors and perseverations, following directions   Safety/Cognition/ Behavioral Observations  Min -Mod  supervision  recall, safety, basic problem solving, selective attention, emergent awareness   Pain   pt is pain free  remain free of pain  assess Q4H and PRN   Skin   bruising on abdomen  remain free of skin breakdown   Assess qshift and PRN     Discharge Planning:  Family persuing SNF, if unable d/c home with family   Team Discussion: Patient with neurogenic bladder; requiring intermittent catheterizations every 6 hours. MD ordered urecholine however Flomax on hold due to hypotension.Plan to place foley and patient to follow up with urology at discharge. More awareness of right lower extremity and increased coordination of the right leg during turns and stepping backwards. Continue to note word finding difficulties but improved with structured tasks. Note yeast under breasts; MD to order antifungal powder and MASD to buttocks treated with medicated cream.   Patient on target to meet rehab goals: yes, CGA-Min assist goals  *See Care Plan and progress notes for long and short-term goals.   Revisions to Treatment  Plan:   Teaching Needs: Transfers, toileting, skin care, medications, etc.  Current Barriers to Discharge: Decreased caregiver support and Neurogenic bowel and bladder  Possible Resolutions to Barriers: Recommend SNF for discharge     Medical Summary Current Status: APhasia persists, urinary incont  Barriers to Discharge: Medical stability;Neurogenic Bowel & Bladder   Possible Resolutions to  Barriers/Weekly Focus: trial of meds for urinary retention   Continued Need for Acute Rehabilitation Level of Care: The patient requires daily medical management by a physician with specialized training in physical medicine and rehabilitation for the following reasons: Direction of a multidisciplinary physical rehabilitation program to maximize functional independence : Yes Medical management of patient stability for increased activity during participation in an intensive rehabilitation regime.: Yes Analysis of laboratory values and/or radiology reports with any subsequent need for medication adjustment and/or medical intervention. : Yes   I attest that I was present, lead the team conference, and concur with the assessment and plan of the team.   Dorien Chihuahua B 05/31/2020, 2:36 PM

## 2020-06-01 ENCOUNTER — Inpatient Hospital Stay (HOSPITAL_COMMUNITY): Payer: Medicare Other | Admitting: Physical Therapy

## 2020-06-01 ENCOUNTER — Inpatient Hospital Stay (HOSPITAL_COMMUNITY): Payer: Medicare Other | Admitting: Speech Pathology

## 2020-06-01 ENCOUNTER — Inpatient Hospital Stay (HOSPITAL_COMMUNITY): Payer: Medicare Other | Admitting: Occupational Therapy

## 2020-06-01 LAB — GLUCOSE, CAPILLARY
Glucose-Capillary: 158 mg/dL — ABNORMAL HIGH (ref 70–99)
Glucose-Capillary: 113 mg/dL — ABNORMAL HIGH (ref 70–99)
Glucose-Capillary: 116 mg/dL — ABNORMAL HIGH (ref 70–99)
Glucose-Capillary: 143 mg/dL — ABNORMAL HIGH (ref 70–99)

## 2020-06-01 NOTE — Progress Notes (Signed)
[place] PHYSICAL MEDICINE & REHABILITATION PROGRESS NOTE   Subjective/Complaints:  No issues overnite  ROS: Limited by aphasia.  Objective:   No results found. No results for input(s): WBC, HGB, HCT, PLT in the last 72 hours. No results for input(s): NA, K, CL, CO2, GLUCOSE, BUN, CREATININE, CALCIUM in the last 72 hours.  Intake/Output Summary (Last 24 hours) at 06/01/2020 0830 Last data filed at 06/01/2020 0529 Gross per 24 hour  Intake --  Output 1602 ml  Net -1602 ml        Physical Exam: Vital Signs Blood pressure (!) 124/54, pulse (!) 58, temperature 97.6 F (36.4 C), resp. rate 17, height 5\' 4"  (1.626 m), weight 66.5 kg, SpO2 97 %.   General: No acute distress Mood and affect are appropriate Heart: Regular rate and rhythm no rubs murmurs or extra sounds Lungs: Clear to auscultation, breathing unlabored, no rales or wheezes Abdomen: Positive bowel sounds, soft nontender to palpation, nondistended Extremities: No clubbing, cyanosis, or edema Skin: No evidence of breakdown, no evidence of rash   Neuro: Alert, cannot assess orientation due to word finding/substitution issues  Expressive >receptive aphasia, unchanged Motor: LUE/LLE: 5/5 proximal distal RUE/RLE: 4-4+/5 proximal distal   Assessment/Plan: 1. Functional deficits which require 3+ hours per day of interdisciplinary therapy in a comprehensive inpatient rehab setting.  Physiatrist is providing close team supervision and 24 hour management of active medical problems listed below.  Physiatrist and rehab team continue to assess barriers to discharge/monitor patient progress toward functional and medical goals  Care Tool:  Bathing  Bathing activity did not occur: Refused Body parts bathed by patient: Right arm, Left arm, Chest, Abdomen, Front perineal area, Right upper leg, Left upper leg, Face   Body parts bathed by helper: Front perineal area, Buttocks, Right lower leg, Left lower leg     Bathing  assist Assist Level: Moderate Assistance - Patient 50 - 74%     Upper Body Dressing/Undressing Upper body dressing   What is the patient wearing?: Pull over shirt    Upper body assist Assist Level: Minimal Assistance - Patient > 75%    Lower Body Dressing/Undressing Lower body dressing      What is the patient wearing?: Incontinence brief, Pants     Lower body assist Assist for lower body dressing: Moderate Assistance - Patient 50 - 74%     Toileting Toileting Toileting Activity did not occur Landscape architect and hygiene only): Refused  Toileting assist Assist for toileting: Moderate Assistance - Patient 50 - 74%     Transfers Chair/bed transfer  Transfers assist     Chair/bed transfer assist level: Minimal Assistance - Patient > 75%     Locomotion Ambulation   Ambulation assist   Ambulation activity did not occur: Safety/medical concerns  Assist level: Minimal Assistance - Patient > 75% Assistive device: Walker-rolling Max distance: 48ft   Walk 10 feet activity   Assist  Walk 10 feet activity did not occur: Safety/medical concerns  Assist level: Minimal Assistance - Patient > 75% Assistive device: Walker-rolling   Walk 50 feet activity   Assist Walk 50 feet with 2 turns activity did not occur: Safety/medical concerns         Walk 150 feet activity   Assist Walk 150 feet activity did not occur: Safety/medical concerns         Walk 10 feet on uneven surface  activity   Assist Walk 10 feet on uneven surfaces activity did not occur: Safety/medical concerns  Wheelchair     Assist Will patient use wheelchair at discharge?: Yes Type of Wheelchair: Manual    Wheelchair assist level: Total Assistance - Patient < 25% Max wheelchair distance: 150    Wheelchair 50 feet with 2 turns activity    Assist        Assist Level: Maximal Assistance - Patient 25 - 49%   Wheelchair 150 feet activity     Assist       Assist Level: Total Assistance - Patient < 25%   Blood pressure (!) 124/54, pulse (!) 58, temperature 97.6 F (36.4 C), resp. rate 17, height 5\' 4"  (1.626 m), weight 66.5 kg, SpO2 97 %.  Medical Problem List and Plan: 1.  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  Antithrombotics: -DVT/anticoagulation: Bilateral lower extremity Dopplers negative for DVT.  Eliquis             -antiplatelet therapy: N/A 3. Pain Management: Tylenol as needed. Complains of neck discomfort:   K pad  Appears controlled with meds/interventions on 11/29 4. Mood: Provide emotional support             -antipsychotic agents: N/A 5. Neuropsych: This patient is not capable of making decisions on her own behalf. 6. Skin/Wound Care: Routine skin checks Augmentin for cellulitis thru 11/18 7. Fluids/Electrolytes/Nutrition: Routine in and outs 8.  Atrial fibrillation.  Cardizem 60 mg every 8 hours, Toprol-XL 100 mg daily.   9.  Diabetes mellitus with hyperglycemia.  Hemoglobin A1c 6.4.  SSI.  Patient on Glucophage 500 mg daily prior to admission.  Resume as needed CBG (last 3)  Recent Labs    05/31/20 1201 05/31/20 1620 05/31/20 2134  GLUCAP 99 110* 112*  Well controlled 11/30 10.Hypertension.  Monitor with increased mobility.  Patient on lisinopril 40 mg daily prior to admission.  Resume as needed.  Vitals:   05/31/20 1956 06/01/20 0527  BP: (!) 93/57 (!) 124/54  Pulse: 62 (!) 58  Resp: 16 17  Temp: (!) 97.5 F (36.4 C) 97.6 F (36.4 C)  SpO2: 97% 97%  controlled 12/1 11. Hyperlipidemia.  Zetia 12.  Left lower extremity cellulitis.  Venous Doppler studies negative.    Completed course of Augmentin.  13.  Hypo K   Continue KCL, increased supplementation  Potassium 3.8 on 11/26,  labs ordered for tomorrow 14.  Leukocytosis resolved 15.  Mild diastolic dysfunction, mod /severe tricuspid regurg, increased LE edema, chest x-ray shows pleural effusions diuresed for 3 days Filed Weights   05/30/20 0500 05/30/20 0609 05/31/20 0454  Weight: 66.9 kg 63.8 kg 66.5 kg   Stable  16. Constipation  Last BM 11/29, adjust laxatives 17.  Hyponatremia  Sodium 133 on 11/26, labs ordered for tomorrow  Continue to monitor  18.  Acute lower UTI  UA +  Urine culture with >100K gram neg rods, Citrobacter koseri S to nitrofurantoin   Macrobid started 11/27- will give 7d course 19. Neurogenic bladder:  I/O caths freq increased, urinary retention , BP soft so avoid flomax, will trial low dose urecholine increase to QID 12/1  LOS: 17 days A FACE TO [place] E Jacara Benito 06/01/2020, 8:30 AM

## 2020-06-01 NOTE — Progress Notes (Signed)
Occupational Therapy Session Note  Patient Details  Name: Sheila Ray MRN: [id number] Date of Birth: March 28, 1941  Today's Date: 06/01/2020 OT Individual Time: 1001-1055 OT Individual Time Calculation (min): 54 min    Short Term Goals: Week 2:  OT Short Term Goal 1 (Week 2): Patient will complete LB bathing/dressing with Mod A OT Short Term Goal 1 - Progress (Week 2): Met OT Short Term Goal 2 (Week 2): Patient will complete 2/3 toileting components with Mod A OT Short Term Goal 2 - Progress (Week 2): Met OT Short Term Goal 3 (Week 2): Patient will complete toilet transfer with use of LRAD and Mod A. OT Short Term Goal 3 - Progress (Week 2): Met Week 3:  OT Short Term Goal 1 (Week 3): STG= LTG d/t ELOS  Skilled Therapeutic Interventions/Progress Updates:    Pt greeted at time of session supine in bed resting agreeable to OT session, global aphasia evident throughout session with expressive > receptive. No pain reported with simple yes/no. Pt noted to have brief on without straps, unable to strap on and dependent brief change at bed level for time management but was not soiled. Supine to sit Min/Mod to manage RLE and trunk, sit to stand Min A throughout session at RW, stand pivot to wheelchair Min A with extended time with physical assist to manage RLE. Set up at sink level for oral hygiene, washing face, and brushing hair all with Supervision - Min A. Frequent verbal and visual cues throughout to attend to R side, find objects, and sequence tasks. Transported to gym via wheelchair for time management, set up on SCIFIT level 2 initially later decreased to 1 d/t fatigue. Performed 5 mins forward, 3 mins backward but too fatigued to continue. Brought back to room via wheelchair and requested back to bed, stand pivot back to bed R side with Min A, cues for fully turning prior to sitting and again physical assist for RLE management. Sit to supine Min A, alarm on call bell in reach.   Therapy  Documentation Precautions:  Precautions Precautions: Fall Restrictions Weight Bearing Restrictions: No     Therapy/Group: Individual Therapy  Viona Gilmore 06/01/2020, 11:00 AM

## 2020-06-01 NOTE — Progress Notes (Addendum)
Patient ID: Sheila Ray, female   DOB: May 30, 1941, 79 y.o.   MRN: [id number]   Sw received call from [facility] requesting peer to peer by 1:30 PM. Physician has been provided with this information. Will wait for determination.   [phone] [place], [place]

## 2020-06-01 NOTE — Progress Notes (Addendum)
Physical Therapy Session Note  Patient Details  Name: Sheila Ray MRN: [id number] Date of Birth: [birth year]  Today's Date: 06/01/2020 PT Individual Time: 7341-9379  PT Individual Time Calculation (min): 83 min   Short Term Goals: Week 3:  PT Short Term Goal 1 (Week 3): STG=LTG due to ELOS  Skilled Therapeutic Interventions/Progress Updates:    Pt received supine in bed and agreeable to PT. PT asked pt if she would like to go to the atrium and gift shop area for this session, however, pt stated that she couldn't go there because she was leaving the hospital tonight. PT clarified with the patient that she would not be d/c tonight and pt agreed to continue with session as planned. Pt able to come to sit on R EOB with min assist for SL to sit and scooting anteriorly. Pt sit <>stand with min assist and performed ambulatory transfer to wc with CGA and minimal cueing. Pt did need extra verbal cueing in order to complete turn to her R and bring R foot underneath her. Pt transported to atrium in wc total assist. Patient does not report pain during this session.  Gait:  -Pt ambulated through gift shop with RW and one rest break after ~26f, then completed another 534f Pt was tasked with finding 3 different items inside of the gift shop and was able to repeat them back to PT (2 out of 3). Pt became overwhelmed once inside gift shop while trying to find items and navigate between displays, however, maintained CGA (min assist intermittently for weight shift to L in order to clear R foot).  -Pt ambulated in breezeway outside ~405fnd ~68f36fth min assist for weight shift to L.   Balance @ BITS: -pt instructed to stand in RW and use R hand to trace 3 different pictures. Total standing time ~1.5min63mt able to perform with CGA and min assist for RUE.   Pt returned to room and returned to semi-reclined in bed performing a stand pivot transfer with CGA and [facility]. PT doffed shoes  and pants with max assist in bed and left semi reclined with call bell in reach, bed alarm on, and needs met.   Therapy Documentation Precautions:  Precautions Precautions: Fall Restrictions Weight Bearing Restrictions: No Vital Signs: Therapy Vitals Temp: 97.9 F (36.6 C) Pulse Rate: 72 Resp: 14 BP: 98/69 Patient Position (if appropriate): Lying Oxygen Therapy SpO2: 95 % O2 Device: Room Air Pain: Pain Assessment Pain Scale: 0-10 Pain Score: 0-No pain    Therapy/Group: Individual Therapy  BaileGaylord Shih 06/01/2020, 5:39 PM

## 2020-06-01 NOTE — Progress Notes (Signed)
Speech Language Pathology Daily Session Note  Patient Details  Name: Sheila Ray MRN: [id number] Date of Birth: Aug 04, 1940  Today's Date: 06/01/2020 SLP Individual Time: 1300-1355 SLP Individual Time Calculation (min): 55 min  Short Term Goals: Week 3: SLP Short Term Goal 1 (Week 3): STG=LTG due to remaining length of stay  Skilled Therapeutic Interventions: Pt was seen for skilled ST targeting communication goals. Upon arrival, SLP engaged pt in semi-complex conversation regarding d/c plans, during which pt expressed herself with 85-90% accuracy at the phrase level and Min A verbal question cues for clarification of messages, correcting sematic paraphasias, and word finding strategies. Pt continues to demonstrate increased difficulty with awareness of verbal errors and ability to correct them during more structured tasks. She described actions within photographs (mostly in phrase level utterances) with ~70% accuracy and Mod A semantic, Min A phonemic cues. During an additional picture based task, she named common objects with 80% accuracy and Min A semantic or phonemic cues, but Mod A semantic cueing required for her to describe similarities between items. Overall Mod A verbal cues and feedback required for awareness of verbal errors today. Pt left laying in bed with alarm set and needs within reach. Continue per current plan of care.          Pain Pain Assessment Pain Scale: 0-10 Pain Score: 0-No pain  Therapy/Group: Individual Therapy  Arbutus Leas 06/01/2020, 7:22 AM

## 2020-06-01 NOTE — Progress Notes (Signed)
Linna Hoff, PA made aware of BP this am and at 1150am.  He advised to hold metoprolol dose for SBP less than 100 or HR less than 60.  Med held due to SBP 92/58 as directed.

## 2020-06-01 NOTE — Plan of Care (Signed)
°  Problem: RH Memory Goal: LTG Patient will demonstrate ability for day to day (SLP) Description: LTG:   Patient will demonstrate ability for day to day recall/carryover during cognitive/linguistic activities with assist  (SLP) Outcome: Not Applicable Note: Goal discontinued due to communication intervention and other basic cognitive interventions for attention and awareness taking priority for treatment while inpatient

## 2020-06-01 NOTE — Progress Notes (Signed)
Patient ID: Sheila Ray, female   DOB: 03-27-1941, 79 y.o.   MRN: [id number]  Sw reached information that patients authorization was denied due to patient not meeting criteria. Will plan for discharge home on 06/06/2020. Family will attend education on Saturday 9-12.  [place], [place]

## 2020-06-02 ENCOUNTER — Inpatient Hospital Stay (HOSPITAL_COMMUNITY): Payer: Medicare Other | Admitting: Physical Therapy

## 2020-06-02 ENCOUNTER — Inpatient Hospital Stay (HOSPITAL_COMMUNITY): Payer: Medicare Other

## 2020-06-02 ENCOUNTER — Inpatient Hospital Stay (HOSPITAL_COMMUNITY): Payer: Medicare Other | Admitting: Occupational Therapy

## 2020-06-02 ENCOUNTER — Inpatient Hospital Stay (HOSPITAL_COMMUNITY): Payer: Medicare Other | Admitting: Speech Pathology

## 2020-06-02 LAB — CBC WITH DIFFERENTIAL/PLATELET
Abs Immature Granulocytes: 0.05 10*3/uL (ref 0.00–0.07)
Basophils Absolute: 0.1 10*3/uL (ref 0.0–0.1)
Basophils Relative: 1 %
Eosinophils Absolute: 0.2 10*3/uL (ref 0.0–0.5)
Eosinophils Relative: 2 %
HCT: 44.3 % (ref 36.0–46.0)
Hemoglobin: 13.1 g/dL (ref 12.0–15.0)
Immature Granulocytes: 1 %
Lymphocytes Relative: 17 %
Lymphs Abs: 1.7 10*3/uL (ref 0.7–4.0)
MCH: 22.5 pg — ABNORMAL LOW (ref 26.0–34.0)
MCHC: 29.6 g/dL — ABNORMAL LOW (ref 30.0–36.0)
MCV: 76 fL — ABNORMAL LOW (ref 80.0–100.0)
Monocytes Absolute: 0.7 10*3/uL (ref 0.1–1.0)
Monocytes Relative: 7 %
Neutro Abs: 7.2 10*3/uL (ref 1.7–7.7)
Neutrophils Relative %: 72 %
Platelets: 387 10*3/uL (ref 150–400)
RBC: 5.83 MIL/uL — ABNORMAL HIGH (ref 3.87–5.11)
RDW: 16.5 % — ABNORMAL HIGH (ref 11.5–15.5)
WBC: 10 10*3/uL (ref 4.0–10.5)
nRBC: 0 % (ref 0.0–0.2)

## 2020-06-02 LAB — GLUCOSE, CAPILLARY
Glucose-Capillary: 113 mg/dL — ABNORMAL HIGH (ref 70–99)
Glucose-Capillary: 154 mg/dL — ABNORMAL HIGH (ref 70–99)
Glucose-Capillary: 104 mg/dL — ABNORMAL HIGH (ref 70–99)
Glucose-Capillary: 123 mg/dL — ABNORMAL HIGH (ref 70–99)

## 2020-06-02 LAB — BASIC METABOLIC PANEL
Anion gap: 14 (ref 5–15)
BUN: 10 mg/dL (ref 8–23)
CO2: 18 mmol/L — ABNORMAL LOW (ref 22–32)
Calcium: 9.3 mg/dL (ref 8.9–10.3)
Chloride: 101 mmol/L (ref 98–111)
Creatinine, Ser: 0.84 mg/dL (ref 0.44–1.00)
GFR, Estimated: >60 mL/min (ref >60)
Glucose, Bld: 159 mg/dL — ABNORMAL HIGH (ref 70–99)
Potassium: 4.3 mmol/L (ref 3.5–5.1)
Sodium: 133 mmol/L — ABNORMAL LOW (ref 135–145)

## 2020-06-02 MED ORDER — SENNOSIDES-DOCUSATE SODIUM 8.6-50 MG PO TABS
2.0000 | ORAL_TABLET | Freq: Two times a day (BID) | ORAL | Status: DC
  Administered 2020-06-02 – 2020-06-06 (×7): 2 via ORAL
  Filled 2020-06-02 (×8): qty 2

## 2020-06-02 MED ORDER — BETHANECHOL CHLORIDE 25 MG PO TABS
25.0000 mg | ORAL_TABLET | Freq: Three times a day (TID) | ORAL | Status: DC
  Administered 2020-06-02 – 2020-06-06 (×13): 25 mg via ORAL
  Filled 2020-06-02 (×12): qty 1

## 2020-06-02 NOTE — Progress Notes (Addendum)
Patient ID: Sheila Ray, female   DOB: 08-27-1940, 79 y.o.   MRN: [id number]  SW received call from family requesting referral be sent to [facility]. SW followed up with facility, currently has LTC bed availability. SW confirmed with family that patient is vaccinated and family is able to pay 30 days upfront at facility totaling $8,400. Family agreed to this. Facility will reach out to family to determine admission or not. Sw will continue to provide updates.  Sw received follow up call from [facility] and [facility] sw of current LTC bed availably. Facility reaching out to family.   Sw received call from family post facility follow ups. Family still making decision on LTC or for patient to d/c home. As of now sw planning for patient to d/c home with [facility], unless family selects facility. Patient DME ordered: HB, WC and RW. Awaiting family selection of home health company. Family education scheduled for tomorrow. Family mentioned appealing with insurance.   [place], [place]

## 2020-06-02 NOTE — Progress Notes (Signed)
Patient ID: Sheila Ray, female   DOB: [birth year], 79 y.o.   MRN: [id number]   Patient son, daughter in law and granddaughter will attend family education tomorrow 9-12.   [place], [place]

## 2020-06-02 NOTE — Progress Notes (Signed)
[facility] PHYSICAL MEDICINE & REHABILITATION PROGRESS NOTE   Subjective/Complaints:  Spoke with med Mudlogger from [employer], who rec LT SNF level   ROS: Limited by aphasia.  Objective:   No results found. No results for input(s): WBC, HGB, HCT, PLT in the last 72 hours. No results for input(s): NA, K, CL, CO2, GLUCOSE, BUN, CREATININE, CALCIUM in the last 72 hours.  Intake/Output Summary (Last 24 hours) at 06/02/2020 0831 Last data filed at 06/02/2020 0206 Gross per 24 hour  Intake 240 ml  Output 2250 ml  Net -2010 ml        Physical Exam: Vital Signs Blood pressure 114/64, pulse 71, temperature 97.8 F (36.6 C), temperature source Oral, resp. rate 16, height 5\' 4"  (1.626 m), weight 66.5 kg, SpO2 98 %.   General: No acute distress Mood and affect are appropriate Heart: Regular rate and rhythm no rubs murmurs or extra sounds Lungs: Clear to auscultation, breathing unlabored, no rales or wheezes Abdomen: Positive bowel sounds, soft nontender to palpation, nondistended Extremities: No clubbing, cyanosis, or edema Skin: No evidence of breakdown, no evidence of rash  Neuro: Alert, cannot assess orientation due to word finding/substitution issues  Expressive >receptive aphasia, unchanged Motor: LUE/LLE: 5/5 proximal distal RUE/RLE: 4-4+/5 proximal distal   Assessment/Plan: 1. Functional deficits which require 3+ hours per day of interdisciplinary therapy in a comprehensive inpatient rehab setting.  Physiatrist is providing close team supervision and 24 hour management of active medical problems listed below.  Physiatrist and rehab team continue to assess barriers to discharge/monitor patient progress toward functional and medical goals  Care Tool:  Bathing  Bathing activity did not occur: Refused Body parts bathed by patient: Right arm, Left arm, Chest, Abdomen, Front perineal area, Right upper leg, Left upper leg, Face   Body parts bathed by helper: Front perineal area,  Buttocks, Right lower leg, Left lower leg     Bathing assist Assist Level: Moderate Assistance - Patient 50 - 74%     Upper Body Dressing/Undressing Upper body dressing   What is the patient wearing?: Pull over shirt    Upper body assist Assist Level: Minimal Assistance - Patient > 75%    Lower Body Dressing/Undressing Lower body dressing      What is the patient wearing?: Pants     Lower body assist Assist for lower body dressing: Moderate Assistance - Patient 50 - 74%     Toileting Toileting Toileting Activity did not occur Landscape architect and hygiene only): Refused  Toileting assist Assist for toileting: Moderate Assistance - Patient 50 - 74%     Transfers Chair/bed transfer  Transfers assist     Chair/bed transfer assist level: Minimal Assistance - Patient > 75%     Locomotion Ambulation   Ambulation assist   Ambulation activity did not occur: Safety/medical concerns  Assist level: Minimal Assistance - Patient > 75% Assistive device: Walker-rolling Max distance: 87ft   Walk 10 feet activity   Assist  Walk 10 feet activity did not occur: Safety/medical concerns  Assist level: Minimal Assistance - Patient > 75% Assistive device: Walker-rolling   Walk 50 feet activity   Assist Walk 50 feet with 2 turns activity did not occur: Safety/medical concerns         Walk 150 feet activity   Assist Walk 150 feet activity did not occur: Safety/medical concerns         Walk 10 feet on uneven surface  activity   Assist Walk 10 feet on uneven surfaces  activity did not occur: Safety/medical concerns         Wheelchair     Assist Will patient use wheelchair at discharge?: Yes Type of Wheelchair: Manual    Wheelchair assist level: Total Assistance - Patient < 25% Max wheelchair distance: 150    Wheelchair 50 feet with 2 turns activity    Assist        Assist Level: Maximal Assistance - Patient 25 - 49%   Wheelchair  150 feet activity     Assist      Assist Level: Total Assistance - Patient < 25%   Blood pressure 114/64, pulse 71, temperature 97.8 F (36.6 C), temperature source Oral, resp. rate 16, height 5\' 4"  (1.626 m), weight 66.5 kg, SpO2 98 %.  Medical Problem List and Plan: 1.  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  Antithrombotics: -DVT/anticoagulation: Bilateral lower extremity Dopplers negative for DVT.  Eliquis             -antiplatelet therapy: N/A 3. Pain Management: Tylenol as needed. Complains of neck discomfort:   K pad  Appears controlled with meds/interventions on 11/29 4. Mood: Provide emotional support             -antipsychotic agents: N/A 5. Neuropsych: This patient is not capable of making decisions on her own behalf. 6. Skin/Wound Care: Routine skin checks Augmentin for cellulitis thru 11/18 7. Fluids/Electrolytes/Nutrition: Routine in and outs- intake ~50% meals 8.  Atrial fibrillation.  Cardizem 60 mg every 8 hours, Toprol-XL 100 mg daily.   9.  Diabetes mellitus with hyperglycemia.  Hemoglobin A1c 6.4.  SSI.  Patient on Glucophage 500 mg daily prior to admission.  Resume as needed CBG (last 3)  Recent Labs    06/01/20 1642 06/01/20 2044 06/02/20 0547  GLUCAP 116* 143* 113*  Well controlled 12/3 10.Hypertension.  Monitor with increased mobility.  Patient on lisinopril 40 mg daily prior to admission.  Resume as needed.  Vitals:   06/01/20 1917 06/02/20 0419  BP: 109/78 114/64  Pulse: 69 71  Resp: 15 16  Temp: 97.6 F (36.4 C) 97.8 F (36.6 C)  SpO2: 98% 98%  controlled 12/3 11. Hyperlipidemia.  Zetia 12.  Left lower extremity cellulitis.  Venous Doppler studies negative.    Completed course of Augmentin.  13.  Hypo K    Continue KCL, last K+ normal 3.8 on 11/26 repeat Monday    14.  Leukocytosis resolved 15.  Mild diastolic dysfunction, mod /severe tricuspid regurg, increased LE edema, chest x-ray shows pleural effusions diuresed for 3 days Filed Weights   05/30/20 0500 05/30/20 0609 05/31/20 0454  Weight: 66.9 kg 63.8 kg 66.5 kg   Stable  16. Constipation  Last BM 12/1, adjust laxatives 17.  Hyponatremia  resolved  Continue to monitor  18.  Acute lower UTI  UA +  Urine culture with >100K gram neg rods, Citrobacter koseri S to nitrofurantoin   Macrobid started 11/27- will give 7d course 19. Neurogenic bladder:  I/O caths freq increased, urinary retention , BP soft so avoid flomax, will trial low dose urecholine increase to QID 12/1, increase to 25mg  TID   LOS: 18 days A FACE TO FACE EVALUATION WAS PERFORMED  Charlett Blake 06/02/2020, 8:31 AM

## 2020-06-02 NOTE — Progress Notes (Signed)
Speech Language Pathology Daily Session Note  Patient Details  Name: Sheila Ray MRN: [id number] Date of Birth: 1941-02-26  Today's Date: 06/02/2020 SLP Individual Time: 0930-1025 SLP Individual Time Calculation (min): 55 min  Short Term Goals: Week 3: SLP Short Term Goal 1 (Week 3): STG=LTG due to remaining length of stay  Skilled Therapeutic Interventions: Pt was seen for skilled ST targeting cognitive-linguistic goals. Pt appeared more frustrated and slightly limited by pain (buttock area) this morning. She communicated her needs, questions, and preferences throughout session with some semantic paraphasias, but overall only Min A question cues from SLP required to determine her messages. During a basic completed displayed set amounts of change/cash with Min A verbal and visual cues for problem solving and error awareness. She also completed a semi-complex 4-step action picture card sequencing task with ~80% accuracy and Min A verbal and visual cues for awareness of errors, as well as problem solving corrections. Min A verbal cues were required for redirection to tasks today, mostly due to pt's internal distractions, in addition to pain and restlessness. Pt left laying in bed with alarm set, needs met to her satisfaction and call bell at side. Continue per current plan of care.          Pain Pain Assessment Pain Scale: Faces Faces Pain Scale: Hurts little more Pain Type: Acute pain Pain Location: Buttocks Pain Descriptors / Indicators: Discomfort;Grimacing Pain Onset: On-going Patients Stated Pain Goal: 3 Pain Intervention(s): Repositioned;Distraction Multiple Pain Sites: No  Therapy/Group: Individual Therapy  Arbutus Leas 06/02/2020, 7:24 AM

## 2020-06-02 NOTE — Progress Notes (Signed)
Occupational Therapy Session Note  Patient Details  Name: Sheila Ray MRN: [id number] Date of Birth: [birth year]  Today's Date: 06/02/2020 OT Individual Time: 1415-1456 OT Individual Time Calculation (min): 41 min   Short Term Goals: Week 2:  OT Short Term Goal 1 (Week 2): Patient will complete LB bathing/dressing with Mod A OT Short Term Goal 1 - Progress (Week 2): Met OT Short Term Goal 2 (Week 2): Patient will complete 2/3 toileting components with Mod A OT Short Term Goal 2 - Progress (Week 2): Met OT Short Term Goal 3 (Week 2): Patient will complete toilet transfer with use of LRAD and Mod A. OT Short Term Goal 3 - Progress (Week 2): Met  Skilled Therapeutic Interventions/Progress Updates:    Pt greeted in bed with no s/s pain. When asked if she needed to use the restroom, pt reported "we need to." Max A for supine<sit where pt donned her shoes with Total A. Mod A for stand pivot<w/c using the RW and the same assistance required for stand pivot<elevated toilet using RW. Note that pt required cuing for power up technique and to attend to the Rt LE in stance. When pt was asked to assist with lowering clothing, she reported "I can't hold it" and therefore clothing mgt completed with Total A. After a few minutes of sitting, pt was ultimately unable to void. She completed hygiene in the front to ensure cleanliness and OT applied barrier cream before elevating clothing (new brief donned as pt was a little incontinent of bowels beforehand). Min A for sit<stands from elevated toilet. After she returned to the w/c, pt completed handwashing at the sink, along with oral care and lotion application. Pt encouraged to independently scan/reach for needed items as she'd look over at OT and state "I can't." Supervision/cuing while participating in stated tasks. Mod A for stand pivot<bed using RW where pt doffed her shoes with cuing. Pt returned to bed with Min A for the Rt LE. She remained in bed at close of  session, left with all needs within reach and bed alarm set. Tx focus placed on functional transfers, praxis, Rt NMR, and OOB tolerance.   Therapy Documentation Precautions:  Precautions Precautions: Fall Restrictions Weight Bearing Restrictions: No Vital Signs: Therapy Vitals Temp: 97.7 F (36.5 C) Temp Source: Oral Pulse Rate: 84 Resp: 14 BP: (!) 108/59 Patient Position (if appropriate): Lying Oxygen Therapy SpO2: 95 % O2 Device: Room Air ADL: ADL Grooming: Minimal assistance Where Assessed-Grooming: Sitting at sink, Wheelchair Upper Body Bathing: Moderate assistance Where Assessed-Upper Body Bathing: Shower Lower Body Bathing: Dependent Where Assessed-Lower Body Bathing: Shower Upper Body Dressing: Moderate assistance Where Assessed-Upper Body Dressing: Wheelchair Lower Body Dressing: Dependent Where Assessed-Lower Body Dressing: Wheelchair Toileting: Dependent Where Assessed-Toileting: Glass blower/designer: Dependent, Maximal assistance, Other (comment) (via stedy) Toilet Transfer Method: Other (comment) (via stedy) Science writer: Raised toilet seat Social research officer, government: Maximal assistance, Dependent Social research officer, government Method: Other (comment) (via stedy) Walk-In Shower Equipment: Transfer tub bench      Therapy/Group: Individual Therapy  Karmella Bouvier A Dequane Strahan 06/02/2020, 3:48 PM

## 2020-06-02 NOTE — Progress Notes (Signed)
Physical Therapy Session Note  Patient Details  Name: Sheila Ray MRN: [id number] Date of Birth: [birth year]  Today's Date: 06/02/2020 PT Individual Time: 1030-1115 PT Individual Time Calculation (min): 45 min   Short Term Goals: Week 3:  PT Short Term Goal 1 (Week 3): STG=LTG due to ELOS  Skilled Therapeutic Interventions/Progress Updates:    Pt received supine in bed and agreeable to PT. Pt able to come to sit on R EOB with mod assist for LE management and assistance from sidelying to sit. Pt able to scoot to EOB with min assist. Pt sit>stand min assist from lowest bed height. Per timed toileting protocol, pt ambulated with RW and CGA to toilet. While turning to sit, pt needing min assist for RLE management despite addition of weight shift from PT. Pt able to void bowel and bladder. Sit>stand with RW with min assist and able to hold/maintain standing with forward lean with CGA in order for PT to perform posterior pericare at total assist. PT donned new brief with patient in standing with CGA for standing and max assist for donning brief. Pt performed ambulatory transfer to wc at bedside. PT assisted donning socks, shoes, and pants max assist. Upper body dressing performed with min assist. Pt able to stand from wc with min assist for lower body clothing management. Pt transported to day room total assist for time management and energy conservation. Pt does not report pain during this session.   Gait: -per difficulty activating R hamstrings and difficulty completing turns, dynamic gait tasks performed -pt instructed to step over 4 cones in a line with her R foot while ambulating with CGA and RW. Pt unable to pick foot up to height of cone due to weak hip flexors and hamstrings. However, pt did demonstrate increased step length with R foot during this task. -pt instructed to weave through 4 cones and able to perform with CGA and RW.  -in total, pt ambulated ~73f  Pt returned to wc and  transported to room total assist. Left sitting in wc with chair alarm on, call bell in reach and needs met.   Therapy Documentation Precautions:  Precautions Precautions: Fall Restrictions Weight Bearing Restrictions: No Vital Signs: Therapy Vitals Pulse Rate: 71 BP: 100/68   Therapy/Group: Individual Therapy  BGaylord Shih SPT 06/02/2020, 11:41 AM

## 2020-06-02 NOTE — Progress Notes (Signed)
Reesa Chew, PA notified of dark and cloudy urine with 366ml of output since 0200.  Also made aware of slight increase in confusion compared to last couple of days.  Orders received, will notify PA of any abnormal lab values and will encourage increased fluid intake with patient.

## 2020-06-03 LAB — GLUCOSE, CAPILLARY
Glucose-Capillary: 111 mg/dL — ABNORMAL HIGH (ref 70–99)
Glucose-Capillary: 128 mg/dL — ABNORMAL HIGH (ref 70–99)
Glucose-Capillary: 108 mg/dL — ABNORMAL HIGH (ref 70–99)
Glucose-Capillary: 122 mg/dL — ABNORMAL HIGH (ref 70–99)

## 2020-06-03 LAB — URINALYSIS, COMPLETE (UACMP) WITH MICROSCOPIC
Bacteria, UA: NONE SEEN
Bilirubin Urine: NEGATIVE
Glucose, UA: NEGATIVE mg/dL
Ketones, ur: 5 mg/dL — AB
Nitrite: NEGATIVE
Protein, ur: 100 mg/dL — AB
RBC / HPF: >50 RBC/hpf — ABNORMAL HIGH (ref 0–5)
Specific Gravity, Urine: 1.011 (ref 1.005–1.030)
WBC, UA: >50 WBC/hpf — ABNORMAL HIGH (ref 0–5)
pH: 6 (ref 5.0–8.0)

## 2020-06-03 NOTE — Plan of Care (Signed)
  Problem: RH Bed Mobility Goal: LTG Patient will perform bed mobility with assist (PT) Description: LTG: Patient will perform bed mobility with assistance, with/without cues (PT). Flowsheets (Taken 06/03/2020 1752) LTG: Pt will perform bed mobility with assistance level of: Minimal Assistance - Patient > 75% Note: Downgraded due to slow progress    Problem: RH Wheelchair Mobility Goal: LTG Patient will propel w/c in controlled environment (PT) Description: LTG: Patient will propel wheelchair in controlled environment, # of feet with assist (PT) Flowsheets (Taken 06/03/2020 1752) LTG: Pt will propel w/c in controlled environ  assist needed:: Minimal Assistance - Patient > 75% LTG: Propel w/c distance in controlled environment: 100 Note: Downgraded due to progress Goal: LTG Patient will propel w/c in home environment (PT) Description: LTG: Patient will propel wheelchair in home environment, # of feet with assistance (PT). Flowsheets (Taken 06/03/2020 1752) LTG: Pt will propel w/c in home environ  assist needed:: Minimal Assistance - Patient > 75% LTG: Propel w/c distance in home environment: 50   Problem: RH Stairs Goal: LTG Patient will ambulate up and down stairs w/assist (PT) Description: LTG: Patient will ambulate up and down # of stairs with assistance (PT) Flowsheets (Taken 06/03/2020 1752) LTG: Pt will ambulate up/down stairs assist needed:: Minimal Assistance - Patient > 75% LTG: Pt will  ambulate up and down number of stairs: 1 step with RW

## 2020-06-03 NOTE — Progress Notes (Signed)
Verbal orders per MD Naaman Plummer to educate family on nursing cares for at home. (Skin care, foley care, repositioning).   Family unavailable at this time. Will pass on to next shift that educated is needed. Education materials placed in pt room.   Sheela Stack, LPN

## 2020-06-03 NOTE — Progress Notes (Signed)
Physical Therapy Session Note  Patient Details  Name: Sheila Ray MRN: [id number] Date of Birth: 21-May-1941  Today's Date: 06/03/2020 PT Individual Time: 1000-1038 PT Individual Time Calculation (min): 38 min   Short Term Goals: Week 3:  PT Short Term Goal 1 (Week 3): STG=LTG due to ELOS  Skilled Therapeutic Interventions/Progress Updates:   Pt treatment focused on family education. Pt received supine in bed and agreeable to PT. Supine>sit transfer with min assist at trunk and cues for improved awareness of RLE. Son, daughter-in-law, and grand daughter present.   Sit<>stand from EOB with min assist to pull pants to waist. Ambulatory transfer to [facility] with RW and min assist. Moderate cues for motor planning for turn and step backward. To chair.   Car transfer training with RW and min assist overall from PT and then form family with cues for proper LE placement to improve safety with turns as well as improved single step commands due to motor planning deficits.   Stair management training with RW to ascend/descend 1 step with use of RW x 2 . Min assist with tactile cues for improved step length on the RLE while descending.   Gait training in hall x 25f with RW and family providing min assist for safety. Cues intermittently to improve step height on the RLE.   [facility] and all needs met.         Therapy Documentation Precautions:  Precautions Precautions: Fall Restrictions Weight Bearing Restrictions: No    Pain: Pain Assessment Pain Scale: 0-10 Pain Score: 0-No pain    Therapy/Group: Individual Therapy  ALorie Phenix12/09/2019, 11:11 AM

## 2020-06-03 NOTE — Progress Notes (Signed)
Speech Language Pathology Daily Session Note  Patient Details  Name: Sheila Ray MRN: [id number] Date of Birth: October 08, 1940  Today's Date: 06/03/2020 SLP Individual Time: 7829-5621 SLP Individual Time Calculation (min): 30 min  Short Term Goals: Week 3: SLP Short Term Goal 1 (Week 3): STG=LTG due to remaining length of stay  Skilled Therapeutic Interventions: Skilled SLP intervention focused on family education. Family educated on patients current expressive/receptive language skills. Family asked questions regarding strategies when patient perseverates and recommended  duration of home tasks for language. Family demonstrated good understanding of tasks to complete with patient once home and strategies to increase patients comprehension such as simple instructions, repetition , and writing information. Family also instructed to eliminate distractions and complete one task at a time. Family provided with aphasia worksheets and given education on ways to make activities more complex or easier. Cont with therapy per plan of care.       Pain Pain Assessment Pain Scale: Faces Pain Score: 5  Faces Pain Scale: No hurt Pain Type: Acute pain Pain Location: Buttocks Pain Intervention(s): Medication (See eMAR);Repositioned  Therapy/Group: Individual Therapy  Darrol Poke Donold Marotto 06/03/2020, 12:39 PM

## 2020-06-03 NOTE — Progress Notes (Signed)
Family request to speak to on call provider MD Naaman Plummer.  MD Naaman Plummer notified.  Sheela Stack, LPN

## 2020-06-03 NOTE — Progress Notes (Signed)
Physical Therapy Discharge Summary  Patient Details  Name: Sheila Ray MRN: [id number] Date of Birth: [birth year]  Today's Date: 06/03/2020     Patient has met 9 of 10 long term goals due to improved activity tolerance, improved balance, improved postural control, increased strength, increased range of motion, ability to compensate for deficits, functional use of  right upper extremity and right lower extremity, improved attention, improved awareness and improved coordination.  Patient to discharge at an ambulatory level [place].   Patient's care partner is independent to provide the necessary physical and cognitive assistance at discharge.  Reasons goals not met: Patient does require CGA for dynamic sitting balance, especially for dressing for safety. She has been limited in progress due to poor cognition and activity tolerance. When sitting edge of bed, patient with intermittent LOB R posteriorly when CGA is not provided.   Family present for hands-on family ed prior to patients dc home. They report feeling comfortable with patient dcing home with 24/7 supervision and assist as well as [place] [facility] follow up.   Recommendation:  Patient will benefit from ongoing skilled PT services in home health setting to continue to advance safe functional mobility, address ongoing impairments in balance, awareness, motor planning, transfers, bed mobility, gait, and minimize fall risk.  Equipment: RW and WC and hospital bed  Reasons for discharge: treatment goals met and discharge from hospital  Patient/family agrees with progress made and goals achieved: Yes  PT Discharge Precautions/Restrictions   fall  Vital Signs Therapy Vitals Temp: 97.7 F (36.5 C) Pulse Rate: 69 Resp: 17 BP: 115/67 Patient Position (if appropriate): Lying Oxygen Therapy SpO2: 98 % O2 Device: Room Air Vision/Perception  Vision - Assessment Eye Alignment: Within Functional Limits Ocular Range of Motion: Within  Functional Limits Alignment/Gaze Preference: Within Defined Limits Perception Perception: Impaired (mild R sided inattention) Praxis Praxis: Impaired Praxis Impairment Details: Motor planning;Initiation Praxis-Other Comments: greatly improved from Eval  Cognition Overall Cognitive Status: Impaired/Different from baseline Orientation Level: Oriented X4 Problem Solving: Impaired Sequencing: Impaired Organizing: Impaired Initiating: Impaired Self Correcting: Impaired Behaviors: Perseveration Safety/Judgment: Impaired Comments: low frustration tolerance Sensation Sensation Light Touch: Impaired by gross assessment (on the R side) Proprioception: Impaired by gross assessment Coordination Gross Motor Movements are Fluid and Coordinated: No Fine Motor Movements are Fluid and Coordinated: No Coordination and Movement Description: ataxia on the RLE and RUE Heel Shin Test: unable to follow commands Motor  Motor Motor: Hemiplegia Motor - Discharge Observations: R sided hemiplegia  Mobility Bed Mobility Bed Mobility: Rolling Right;Rolling Left;Supine to Sit;Sitting - Scoot to [place] of Bed;Scooting to [facility];Sit to Supine Rolling Right: Minimal Assistance - Patient > 75% Rolling Left: Minimal Assistance - Patient > 75% Supine to Sit: Minimal Assistance - Patient > 75% Sit to Supine: Minimal Assistance - Patient > 75% Transfers Sit to Stand: Minimal Assistance - Patient > 75% Stand Pivot Transfers: Contact Guard/Touching assist Stand Pivot Transfer Details: Verbal cues for technique;Verbal cues for precautions/safety;Verbal cues for gait pattern;Verbal cues for safe use of DME/AE Locomotion  Gait Ambulation: Yes Gait Assistance: Minimal Assistance - Patient > 75% Gait Distance (Feet): 70 Feet Assistive device: Rolling walker Gait Assistance Details: Verbal cues for technique;Verbal cues for precautions/safety;Verbal cues for gait pattern;Verbal cues for safe use of DME/AE Gait Gait:  Yes Gait Pattern: Impaired Gait Pattern: Lateral trunk lean to right;Ataxic;Poor foot clearance - right Stairs / Additional Locomotion Stairs: Yes Stairs Assistance: Minimal Assistance - Patient > 75% Stair Management Technique: Two rails Number of Stairs:  4 Height of Stairs: 6 Ramp: Minimal Assistance - Patient >75% Curb: Minimal Assistance - Patient >75%  Trunk/Postural Assessment  Cervical Assessment Cervical Assessment: Exceptions to [facility] (forwad head carriage) Thoracic Assessment Thoracic Assessment: Exceptions to [facility] (kyphotic) Lumbar Assessment Lumbar Assessment: Exceptions to [facility] (posterior pelvic tilt) Postural Control Postural Control: Deficits on evaluation (rigid trunk with mild R lateral lean)  Balance Balance Balance Assessed: Yes Static Sitting Balance Static Sitting - Balance Support: Bilateral upper extremity supported;Feet supported Static Sitting - Level of Assistance: 6: Modified independent (Device/Increase time) (with max VC to complete and extra time) Dynamic Sitting Balance Dynamic Sitting - Balance Support: Feet supported;Right upper extremity supported Dynamic Sitting - Level of Assistance: 5: Stand by assistance Dynamic Sitting - Balance Activities: Lateral lean/weight shifting;Trunk control activities Static Standing Balance Static Standing - Balance Support: Bilateral upper extremity supported Static Standing - Level of Assistance: 5: Stand by assistance Dynamic Standing Balance Dynamic Standing - Balance Support: Bilateral upper extremity supported;During functional activity Dynamic Standing - Level of Assistance: 4: Min assist Extremity Assessment      RLE Assessment RLE Assessment: Within Functional Limits General Strength Comments: grossly 4-/5 to 4/5 proximal to distal with functional movements. LLE Assessment LLE Assessment: Exceptions to [facility] Passive Range of Motion (PROM) Comments: [facility] General Strength Comments: grossly 4/5 proximal to  distal with functional movement.    [name] 06/03/2020, 6:21 PM   Debbora Dus, PT, DPT, CBIS

## 2020-06-03 NOTE — Progress Notes (Signed)
[place] PHYSICAL MEDICINE & REHABILITATION PROGRESS NOTE   Subjective/Complaints: Pt without complaints. Slept. Denies pain  ROS: Limited due to cognitive/linguistic    Objective:   No results found. Recent Labs    06/02/20 1257  WBC 10.0  HGB 13.1  HCT 44.3  PLT 387   Recent Labs    06/02/20 1257  NA 133*  K 4.3  CL 101  CO2 18*  GLUCOSE 159*  BUN 10  CREATININE 0.84  CALCIUM 9.3    Intake/Output Summary (Last 24 hours) at 06/03/2020 0952 Last data filed at 06/03/2020 0838 Gross per 24 hour  Intake 120 ml  Output 700 ml  Net -580 ml        Physical Exam: Vital Signs Blood pressure 116/70, pulse 62, temperature 97.8 F (36.6 C), resp. rate 18, height 5\' 4"  (1.626 m), weight 66.5 kg, SpO2 100 %.   Constitutional: No distress . Vital signs reviewed. HEENT: EOMI, oral membranes moist Neck: supple Cardiovascular: RRR without murmur. No JVD    Respiratory/Chest: CTA Bilaterally without wheezes or rales. Normal effort    GI/Abdomen: BS +, non-tender, non-distended Ext: no clubbing, cyanosis, or edema Psych: pleasant and cooperative Skin: No evidence of breakdown, no evidence of rash  Neuro: Alert, cannot assess orientation due to word finding/substitution issues  Expressive >receptive aphasia--no change Motor: LUE/LLE: 5/5 proximal distal RUE/RLE: 4-4+/5 proximal distal   Assessment/Plan: 1. Functional deficits which require 3+ hours per day of interdisciplinary therapy in a comprehensive inpatient rehab setting.  Physiatrist is providing close team supervision and 24 hour management of active medical problems listed below.  Physiatrist and rehab team continue to assess barriers to discharge/monitor patient progress toward functional and medical goals  Care Tool:  Bathing  Bathing activity did not occur: Refused Body parts bathed by patient: Right arm, Left arm, Chest, Abdomen, Front perineal area, Right upper leg, Left upper leg, Face   Body  parts bathed by helper: Front perineal area, Buttocks, Right lower leg, Left lower leg     Bathing assist Assist Level: Moderate Assistance - Patient 50 - 74%     Upper Body Dressing/Undressing Upper body dressing   What is the patient wearing?: Pull over shirt    Upper body assist Assist Level: Minimal Assistance - Patient > 75%    Lower Body Dressing/Undressing Lower body dressing      What is the patient wearing?: Pants     Lower body assist Assist for lower body dressing: Moderate Assistance - Patient 50 - 74%     Toileting Toileting Toileting Activity did not occur Landscape architect and hygiene only): Refused  Toileting assist Assist for toileting: Moderate Assistance - Patient 50 - 74%     Transfers Chair/bed transfer  Transfers assist     Chair/bed transfer assist level: Minimal Assistance - Patient > 75%     Locomotion Ambulation   Ambulation assist   Ambulation activity did not occur: Safety/medical concerns  Assist level: Minimal Assistance - Patient > 75% Assistive device: Walker-rolling Max distance: 46ft   Walk 10 feet activity   Assist  Walk 10 feet activity did not occur: Safety/medical concerns  Assist level: Minimal Assistance - Patient > 75% Assistive device: Walker-rolling   Walk 50 feet activity   Assist Walk 50 feet with 2 turns activity did not occur: Safety/medical concerns         Walk 150 feet activity   Assist Walk 150 feet activity did not occur: Safety/medical concerns  Walk 10 feet on uneven surface  activity   Assist Walk 10 feet on uneven surfaces activity did not occur: Safety/medical concerns         Wheelchair     Assist Will patient use wheelchair at discharge?: Yes Type of Wheelchair: Manual    Wheelchair assist level: Total Assistance - Patient < 25% Max wheelchair distance: 150    Wheelchair 50 feet with 2 turns activity    Assist        Assist Level: Maximal  Assistance - Patient 25 - 49%   Wheelchair 150 feet activity     Assist      Assist Level: Total Assistance - Patient < 25%   Blood pressure 116/70, pulse 62, temperature 97.8 F (36.6 C), resp. rate 18, height 5\' 4"  (1.626 m), weight 66.5 kg, SpO2 100 %.  Medical Problem List and Plan: 1.  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  Antithrombotics: -DVT/anticoagulation: Bilateral lower extremity Dopplers negative for DVT.  Eliquis             -antiplatelet therapy: N/A 3. Pain Management: Tylenol as needed. Complains of neck discomfort:   K pad  Appears controlled with meds/interventions on 11/29 4. Mood: Provide emotional support             -antipsychotic agents: N/A 5. Neuropsych: This patient is not capable of making decisions on her own behalf. 6. Skin/Wound Care: Routine skin checks Augmentin for cellulitis thru 11/18 7. Fluids/Electrolytes/Nutrition: - intake ~25-50% meals  -encourage PO 8.  Atrial fibrillation.  Cardizem 60 mg every 8 hours, Toprol-XL 100 mg daily.   9.  Diabetes mellitus with hyperglycemia.  Hemoglobin A1c 6.4.  SSI.  Patient on Glucophage 500 mg daily prior to admission.  Resume as needed CBG (last 3)  Recent Labs    06/02/20 1648 06/02/20 2108 06/03/20 0550  GLUCAP 104* 123* 111*  Well controlled 12/4 10.Hypertension.  Monitor with increased mobility.  Patient on lisinopril 40 mg daily prior to admission.  Resume as needed.  Vitals:   06/02/20 1943 06/03/20 0439  BP: (!) 99/57 116/70  Pulse: 62 62  Resp: 16 18  Temp: 97.7 F (36.5 C) 97.8 F (36.6 C)  SpO2: 98% 100%  controlled 12/3 11. Hyperlipidemia.  Zetia 12.  Left lower extremity cellulitis.  Venous Doppler studies negative.    Completed course of Augmentin.   13.  Hypo K   Continue KCL, last K+ normal 3.8 on 11/26 repeat Monday    14.  Leukocytosis resolved 15.  Mild diastolic dysfunction, mod /severe tricuspid regurg, increased LE edema, chest x-ray shows pleural effusions diuresed for 3 days Filed Weights   05/30/20 0500 05/30/20 0609 05/31/20 0454  Weight: 66.9 kg 63.8 kg 66.5 kg   Stable  16. Constipation  Last BM 12/1, adjust laxatives 17.  Hyponatremia  resolved  Continue to monitor  18.  Acute lower UTI  UA +  Urine culture with >100K gram neg rods, Citrobacter koseri S to nitrofurantoin   Macrobid started 11/27-completed 7d course  -urine reported as cloudy again, tea colored   -BMET ok yesterday   -re-check UA 19. Neurogenic bladder:  I/O caths freq increased, urinary retention , BP soft so avoid flomax, will trial low dose urecholine increase to  QID 12/1, increased to 25mg  TID    -still requiring I/O caths LOS: 19 days A FACE TO [place] 06/03/2020, 9:52 AM

## 2020-06-03 NOTE — Progress Notes (Addendum)
Lawyer and son in law in room with pt for POA documentation/signatures at this time.  Sheela Stack LPN  Copy placed in chart

## 2020-06-03 NOTE — Progress Notes (Signed)
Pt son in law in to see pt. Son in law educated on diabetes and skin care, preventing pressure injuries, and foley catheter care. Also discussed s/s of UTI and when to call PCP. No further questions.   Will pass onto next shift for education to be given to pt daughter on Tuesday at discharge.   Son in law informed of education on Tuesday, he is in agreement.  Daughter and son in law plan to attend at discharge. Sheela Stack, LPN

## 2020-06-04 LAB — GLUCOSE, CAPILLARY
Glucose-Capillary: 116 mg/dL — ABNORMAL HIGH (ref 70–99)
Glucose-Capillary: 116 mg/dL — ABNORMAL HIGH (ref 70–99)
Glucose-Capillary: 129 mg/dL — ABNORMAL HIGH (ref 70–99)
Glucose-Capillary: 137 mg/dL — ABNORMAL HIGH (ref 70–99)

## 2020-06-04 NOTE — Progress Notes (Signed)
[place] PHYSICAL MEDICINE & REHABILITATION PROGRESS NOTE   Subjective/Complaints: Up in bed. No complaints. Eating breakfast, reasonable appetite  ROS: Patient denies fever, rash, sore throat, blurred vision, nausea, vomiting, diarrhea, cough, shortness of breath or chest pain, joint or back pain, headache, or mood change.   Objective:   No results found. Recent Labs    06/02/20 1257  WBC 10.0  HGB 13.1  HCT 44.3  PLT 387   Recent Labs    06/02/20 1257  NA 133*  K 4.3  CL 101  CO2 18*  GLUCOSE 159*  BUN 10  CREATININE 0.84  CALCIUM 9.3    Intake/Output Summary (Last 24 hours) at 06/04/2020 1006 Last data filed at 06/04/2020 0524 Gross per 24 hour  Intake 460 ml  Output 1251 ml  Net -791 ml        Physical Exam: Vital Signs Blood pressure 124/68, pulse 76, temperature 98.2 F (36.8 C), resp. rate 16, height 5\' 4"  (1.626 m), weight 62 kg, SpO2 97 %.   Constitutional: No distress . Vital signs reviewed. HEENT: EOMI, oral membranes moist Neck: supple Cardiovascular: RRR without murmur. No JVD    Respiratory/Chest: CTA Bilaterally without wheezes or rales. Normal effort    GI/Abdomen: BS +, non-tender, non-distended Ext: no clubbing, cyanosis, or edema Psych: pleasant and cooperative Skin: No evidence of breakdown, no evidence of rash  Neuro: Alert, cannot assess orientation due to word finding/substitution issues  Expressive >receptive aphasia--perhaps a bit more fluid Motor: LUE/LLE: 5/5 proximal distal RUE/RLE: 4-4+/5 proximal distal   Assessment/Plan: 1. Functional deficits which require 3+ hours per day of interdisciplinary therapy in a comprehensive inpatient rehab setting.  Physiatrist is providing close team supervision and 24 hour management of active medical problems listed below.  Physiatrist and rehab team continue to assess barriers to discharge/monitor patient progress toward functional and medical goals  Care Tool:  Bathing  Bathing  activity did not occur: Refused Body parts bathed by patient: Right arm, Left arm, Chest, Abdomen, Front perineal area, Right upper leg, Left upper leg, Face   Body parts bathed by helper: Front perineal area, Buttocks, Right lower leg, Left lower leg     Bathing assist Assist Level: Moderate Assistance - Patient 50 - 74%     Upper Body Dressing/Undressing Upper body dressing   What is the patient wearing?: Pull over shirt    Upper body assist Assist Level: Minimal Assistance - Patient > 75%    Lower Body Dressing/Undressing Lower body dressing      What is the patient wearing?: Pants     Lower body assist Assist for lower body dressing: Moderate Assistance - Patient 50 - 74%     Toileting Toileting Toileting Activity did not occur Landscape architect and hygiene only): Refused  Toileting assist Assist for toileting: Moderate Assistance - Patient 50 - 74%     Transfers Chair/bed transfer  Transfers assist     Chair/bed transfer assist level: Contact Guard/Touching assist     Locomotion Ambulation   Ambulation assist   Ambulation activity did not occur: Safety/medical concerns  Assist level: Minimal Assistance - Patient > 75% Assistive device: Walker-rolling Max distance: 50   Walk 10 feet activity   Assist  Walk 10 feet activity did not occur: Safety/medical concerns  Assist level: Minimal Assistance - Patient > 75% Assistive device: Walker-rolling   Walk 50 feet activity   Assist Walk 50 feet with 2 turns activity did not occur: Safety/medical concerns  Assist level:  Minimal Assistance - Patient > 75%      Walk 150 feet activity   Assist Walk 150 feet activity did not occur: Safety/medical concerns         Walk 10 feet on uneven surface  activity   Assist Walk 10 feet on uneven surfaces activity did not occur: Safety/medical concerns   Assist level: Minimal Assistance - Patient > 75% Assistive device: Hand held assist    Wheelchair     Assist Will patient use wheelchair at discharge?: Yes Type of Wheelchair: Manual    Wheelchair assist level: Minimal Assistance - Patient > 75% Max wheelchair distance: 1103ft    Wheelchair 50 feet with 2 turns activity    Assist        Assist Level: Minimal Assistance - Patient > 75%   Wheelchair 150 feet activity     Assist      Assist Level: Moderate Assistance - Patient 50 - 74%   Blood pressure 124/68, pulse 76, temperature 98.2 F (36.8 C), resp. rate 16, height 5\' 4"  (1.626 m), weight 62 kg, SpO2 97 %.  Medical Problem List and Plan: 1.  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 They would like to bring her home and have participated in family education. It appears that they feel as if they can manage her except for doing I/O caths. If they bring her home, they request that she have a foley catheter initially. I told them that this would not be entirely unreasonable and that it could be inserted at discharge. They can discuss further with primary team Monday  -ELOS 12/7 2.  Antithrombotics: -DVT/anticoagulation: Bilateral lower extremity Dopplers negative for DVT.  Eliquis             -antiplatelet therapy: N/A 3. Pain Management: Tylenol as needed. Complains of neck discomfort:   K pad  Appears controlled with meds/interventions on 11/29 4. Mood: Provide emotional support             -antipsychotic agents: N/A 5. Neuropsych: This patient is not capable of making decisions on her own behalf. 6. Skin/Wound Care: Routine skin checks Augmentin for cellulitis thru 11/18 7. Fluids/Electrolytes/Nutrition: - intake ~25-50% meals  -encourage PO. Was eating breakfast fairly vigorously when I was in  room 8.  Atrial fibrillation.  Cardizem 60 mg every 8 hours, Toprol-XL 100 mg daily.   9.  Diabetes mellitus with hyperglycemia.  Hemoglobin A1c 6.4.  SSI.  Patient on Glucophage 500 mg daily prior to admission.  Resume as needed CBG (last 3)  Recent Labs    06/03/20 1721 06/03/20 2107 06/04/20 0610  GLUCAP 108* 122* 116*  Well controlled 12/5 10.Hypertension.  Monitor with increased mobility.  Patient on lisinopril 40 mg daily prior to admission.  Resume as needed.  Vitals:   06/03/20 2032 06/04/20 0513  BP: 115/71 124/68  Pulse: 62 76  Resp: 16 16  Temp: 97.6 F (36.4 C) 98.2 F (36.8 C)  SpO2: 98% 97%  controlled 12/5 11. Hyperlipidemia.  Zetia 12.  Left lower extremity cellulitis.  Venous Doppler studies negative.    Completed course of Augmentin.  13.  Hypo K   Continue KCL, last K+ normal 3.8 on 11/26 repeat Monday    14.  Leukocytosis resolved  15.  Mild diastolic dysfunction, mod /severe tricuspid regurg, increased LE edema, chest x-ray shows pleural effusions diuresed for 3 days Filed Weights   05/30/20 0609 05/31/20 0454 06/04/20 0500  Weight: 63.8 kg 66.5 kg 62 kg   Stable  16. Constipation  Last BM 12/1, adjust laxatives 17.  Hyponatremia  resolved  Continue to monitor  18.  Acute lower UTI  UA +  Urine culture with >100K gram neg rods, Citrobacter koseri S to nitrofurantoin   Macrobid started 11/27-completed 7d course  12/4 -urine reported as cloudy again, tea colored   -re-check UA  was suspicious  12/5 UCX still pending.  19. Neurogenic bladder:  I/O caths freq increased, urinary retention , BP soft so avoid flomax, will trial low dose urecholine increase to QID 12/1, increased to 25mg  TID    -still requiring I/O caths   -likely foley at discharge LOS: 20 days A FACE TO [place] 06/04/2020, 10:06 AM

## 2020-06-04 NOTE — Progress Notes (Signed)
Occupational Therapy Session Note  Patient Details  Name: Sheila Ray MRN: [id number] Date of Birth: 02/06/1941  Today's Date: 06/04/2020 OT Individual Time: 1440-1515 OT Individual Time Calculation (min): 35 min    Short Term Goals: Week 3:  OT Short Term Goal 1 (Week 3): STG= LTG d/t ELOS  Skilled Therapeutic Interventions/Progress Updates:    Pt received supine, agreeable to session. Pt completed bed mobility with min A, requiring min cueing as well for technique. Pt completed sit > stand with min A. Mod A for stand pivot, primarily for premature sitting to w/c and poor RW management. Pt completed stand pivot transfer to the toilet with use of the grab bar with min A, requiring mod A for clothing management. No urine void but pt stating "i'm done". She stood x3 2/2 poor endurance, needing to sit back down after several seconds of standing for OT to apply barrier cream and complete clothing management. Pt completed oral care and hair care at the sink with min cueing for thoroughness. Pt completed 2x trials of 10 ft of functional mobility in room with the RW with a focus on stand > sit and safe termination/backing up to the chair for fall prevention. She required mod cueing overall. Pt returned to supine in bed and was left with all needs met, bed alarm set.   Therapy Documentation Precautions:  Precautions Precautions: Fall Restrictions Weight Bearing Restrictions: No  Therapy/Group: Individual Therapy  Curtis Sites 06/04/2020, 3:00 PM

## 2020-06-04 NOTE — Discharge Summary (Signed)
Physician Discharge Summary  Patient ID: Sheila Ray MRN: [id number] DOB/AGE: 07/20/1940 79 y.o.  Admit date: 05/15/2020 Discharge date: 06/06/2020  Discharge Diagnoses:  Principal Problem:   Left middle cerebral artery stroke [facility]) Active Problems:   Hyponatremia   Slow transit constipation   Hypokalemia   Controlled type 2 diabetes mellitus with hyperglycemia, without long-term current use of insulin (HCC)   Acute lower UTI   Essential hypertension   Hyperglycemia   Chronic diastolic congestive heart failure ([place])   History of hypertension   Neurogenic bladder DVT prophylaxis Atrial fibrillation Hyperlipidemia Left lower extremity cellulitis Hypothyroidism  Discharged Condition: Stable  Significant Diagnostic Studies: DG Chest 2 View  Result Date: 05/19/2020 CLINICAL DATA:  Shortness of breath with chest pain 2 weeks. EXAM: CHEST - 2 VIEW COMPARISON:  05/12/2020 FINDINGS: Examination demonstrates interval development of moderate size bilateral pleural effusions with likely associated bibasilar atelectasis. There is hazy prominence of the perihilar vessels suggesting mild vascular congestion. Moderate stable cardiomegaly. Remainder of the exam is unchanged. IMPRESSION: 1. Interval development of moderate size bilateral pleural effusions likely with associated bibasilar atelectasis. 2. Cardiomegaly with suggestion of mild vascular congestion. Electronically Signed   By: Marin Olp M.D.   On: 05/19/2020 14:27   US RENAL  Result Date: 05/14/2020 CLINICAL DATA:  Acute renal insufficiency EXAM: RENAL / URINARY TRACT ULTRASOUND COMPLETE COMPARISON:  None. FINDINGS: Right Kidney: Renal measurements: 10.3 by 6.3 x 4.7 cm = volume: 157.6 mL. Echogenicity within normal limits. No mass or hydronephrosis visualized. Mild prominence of the right renal pelvis is nonspecific. Left Kidney: Renal measurements: 10.2 x 6.1 by 4.9 cm = volume: 161.3 mL. Echogenicity within normal limits. No  mass or hydronephrosis visualized. Bladder: Decompressed and not evaluated. Other: None. IMPRESSION: 1. Mild distension of the right renal pelvis without frank hydronephrosis. Otherwise unremarkable exam. Electronically Signed   By: Randa Ngo M.D.   On: 05/14/2020 15:47   DG CHEST PORT 1 VIEW  Result Date: 05/12/2020 CLINICAL DATA:  Leukocytosis EXAM: PORTABLE CHEST 1 VIEW COMPARISON:  05/05/2020 FINDINGS: Single frontal view of the chest demonstrates stable enlarged cardiac silhouette. Persistent central vascular congestion and small bilateral pleural effusions. Patchy areas of consolidation at the lung bases are unchanged. Slight improvement in the diffuse interstitial prominence and patchy ground-glass airspace disease seen previously. IMPRESSION: 1. Persistent but improving congestive heart failure. Superimposed infection cannot be completely excluded. Electronically Signed   By: Randa Ngo M.D.   On: 05/12/2020 21:15   VAS [place] LOWER EXTREMITY VENOUS (DVT)  Result Date: 05/15/2020  Lower Venous DVT Study Indications: Swelling.  Risk Factors: None identified. Limitations: Body habitus, poor ultrasound/tissue interface and patient positioning, patient immobility. Comparison Study: No prior studies. Performing Technologist: Oliver Hum RVT  Examination Guidelines: A complete evaluation includes B-mode imaging, spectral Doppler, color Doppler, and power Doppler as needed of all accessible portions of each vessel. Bilateral testing is considered an integral part of a complete examination. Limited examinations for reoccurring indications may be performed as noted. The reflux portion of the exam is performed with the patient in reverse Trendelenburg.  +---------+---------------+---------+-----------+----------+--------------+ RIGHT    CompressibilityPhasicitySpontaneityPropertiesThrombus Aging +---------+---------------+---------+-----------+----------+--------------+ CFV      Full            Yes      Yes                                 +---------+---------------+---------+-----------+----------+--------------+ SFJ  Full                                                        +---------+---------------+---------+-----------+----------+--------------+ FV Prox  Full                                                        +---------+---------------+---------+-----------+----------+--------------+ FV Mid   Full                                                        +---------+---------------+---------+-----------+----------+--------------+ FV DistalFull                                                        +---------+---------------+---------+-----------+----------+--------------+ PFV      Full                                                        +---------+---------------+---------+-----------+----------+--------------+ POP      Full           Yes      Yes                                 +---------+---------------+---------+-----------+----------+--------------+ PTV      Full                                                        +---------+---------------+---------+-----------+----------+--------------+ PERO     Full                                                        +---------+---------------+---------+-----------+----------+--------------+   +---------+---------------+---------+-----------+----------+-------------------+ LEFT     CompressibilityPhasicitySpontaneityPropertiesThrombus Aging      +---------+---------------+---------+-----------+----------+-------------------+ CFV      Full           Yes      Yes                                      +---------+---------------+---------+-----------+----------+-------------------+ SFJ      Full                                                             +---------+---------------+---------+-----------+----------+-------------------+  FV Prox  Full                                                              +---------+---------------+---------+-----------+----------+-------------------+ FV Mid   Full                                                             +---------+---------------+---------+-----------+----------+-------------------+ FV Distal                                             Not well visualized +---------+---------------+---------+-----------+----------+-------------------+ PFV      Full                                                             +---------+---------------+---------+-----------+----------+-------------------+ POP      Full           Yes      Yes                                      +---------+---------------+---------+-----------+----------+-------------------+ PTV      Full                                                             +---------+---------------+---------+-----------+----------+-------------------+ PERO                                                  Not well visualized +---------+---------------+---------+-----------+----------+-------------------+     Summary: RIGHT: - No evidence of common femoral vein obstruction.  LEFT: - There is no evidence of deep vein thrombosis in the lower extremity. However, portions of this examination were limited- see technologist comments above.  - No cystic structure found in the popliteal fossa.  *See table(s) above for measurements and observations. Electronically signed by Servando Snare MD on 05/15/2020 at 4:33:29 PM.    Final     Labs:  Basic Metabolic Panel: Recent Labs  Lab 06/02/20 1257 06/05/20 0633  NA 133* 135  K 4.3 4.0  CL 101 107  CO2 18* 17*  GLUCOSE 159* 112*  BUN 10 13  CREATININE 0.84 0.69  CALCIUM 9.3 9.0    CBC: Recent Labs  Lab 06/02/20 1257  WBC 10.0  NEUTROABS 7.2  HGB 13.1  HCT 44.3  MCV 76.0*  PLT 387    CBG: Recent Labs  Lab 06/04/20 2059 06/05/20 0624 06/05/20 1122 06/05/20 1627  06/05/20 2103  GLUCAP 137* 117* 145* 107* 139*   Family history.  Mother with osteoporosis and Alzheimer's disease Father with lung cancer Brother with CAD.  Denies any colon cancer esophageal cancer or rectal cancer Brief HPI:   Sheila Ray is a 79 y.o. right-handed female with history of prior left thalamic infarction without residual weakness atrial fibrillation maintained on Xarelto diabetes mellitus hypertension hyperlipidemia and hypothyroidism.  Per chart review lives alone reportedly independent prior to admission and driving.  Presented 05/03/2020 with right side weakness facial droop expressive aphasia and altered mental status.  Her heart rate was noted to be in the 140s.  Cranial CT scan showed no acute intracranial hemorrhage or acute infarction.  CT angiogram of the head and neck occlusion of left M2 MCA branch approximately 13 mm from trifurcation.  No hemodynamically significant stenosis in the neck.  Patient underwent revascularization of occluded superior division M2 branch per interventional radiology.  MRI of the brain showed cortical restricted diffusion within the left temporal occipital region.  Scattered foci of restricted diffusion also seen in the left frontal parietal and insular region of the left amygdala.  MRA with no proximal occlusion or stenosis.  EEG negative for seizure.  Echocardiogram with ejection fraction of 55 to 60% no wall motion abnormalities.  Neurology follow-up as well as cardiology follow-up maintain on Xarelto which was changed to Eliquis.  She did require Cardizem as well as metoprolol for atrial fibrillation.  Tolerating a regular diet.  Patient with noted leukocytosis 19400-24100 05/13/2020 as well as BUN 29 creatinine 1.92 lactic acid within normal limits renal ultrasound no hydronephrosis chest x-ray showed persistent but improving CHF.  Urinalysis negative nitrite.  Leukocytosis felt likely from underlying left lower extremity cellulitis placed on  Augmentin 05/15/2020 with improving WBC to 16,000.  Patient was admitted for a comprehensive rehab program   Hospital Course: Aylana L Howson was admitted to rehab 05/15/2020 for inpatient therapies to consist of PT, ST and OT at least three hours five days a week. Past admission physiatrist, therapy team and rehab RN have worked together to provide customized collaborative inpatient rehab.  In regards to patient's left MCA scattered infarcts to left M2 occlusion status post revascularization she continued on Eliquis therapy would follow-up neurology services.  Bilateral lower extremity Dopplers negative..  Atrial fibrillation controlled Cardizem Toprol as advised no chest pain or shortness of breath should also continue on Eliquis no bleeding episodes.  Blood sugars monitored hemoglobin A1c 6.4 she had been on Glucophage in the past this could be resumed.  Hyperlipidemia she continued on Zetia.  Bouts of urinary retention maintained on Urecholine also needing INO catheterizations family was not able to perform these requesting need for Foley catheter tube she could follow-up with urology services for urinary retention.  Patient was also treated for a Pseudomonas UTI completing course of Levaquin..  Left lower extremity cellulitis venous Doppler studies negative she completed course of Augmentin she was afebrile.  History of mild diastolic dysfunction mild lower extremity edema chest x-ray showed mild pleural effusion she was diuresed for 3 days she exhibited no signs of other fluid overload.   Blood pressures were monitored on TID basis and controlled  Diabetes has been monitored with ac/hs CBG checks and SSI was use prn for tighter BS control.    Rehab course: During patient's stay in rehab weekly team conferences were held to monitor patient's progress, set goals and discuss barriers to discharge. At admission, patient required +2 physical  assist sit to stand +2 physical assist stand pivot transfers  total assist sit to supine moderate assist supine to sit.  Max is upper body bathing total assist lower body bathing total assist upper body dressing total assist lower body dressing  Physical exam.  Blood pressure 122/60 pulse 94 temperature 97.6 respirations 20 oxygen saturations 100% room air Constitutional.  No acute distress HEENT Head.  Normocephalic and atraumatic Eyes.  Pupils round and reactive to light no discharge.nystagmus Neck.  Supple nontender no JVD without thyromegaly Cardiac irregular irregular Abdomen.  Soft nontender positive bowel sounds without rebound Respiratory effort normal no respiratory distress without wheeze Extremities.  No clubbing cyanosis or edema Neurologic.  Alert exhibits mild left gaze preference difficulty following commands.  She can speak at a simple phrase level. Generally left upper extremity 5/5 right upper semithree/5 left lower extremity 3/5 not moving right lower extremity.  Marlana Salvage  has had improvement in activity tolerance, balance, postural control as well as ability to compensate for deficits. Marlana Salvage has had improvement in functional use RUE/LUE  and RLE/LLE as well as improvement in awareness.  Sessions focused on family education supine to sit transfers minimal assist at trunk and cues for improved awareness.  Son daughter-in-law and granddaughter present.  Sit to stand edge of bed minimal assist to pull up pants to waist.  Ambulatory transfers to wheelchair and rolling walker minimal assist.  Moderate cues for motor planning for turn and step backward.  Car transfer training rolling walker minimal assist overall from physical therapy and then from family with cues for proper lower extremity placement.  Stair management training rolling walker ascending descending one-step using rolling walker x2 minimal assist.  Ambulating 50 feet rolling walker.  Completed bed mobility minimal assist required minimal cues.  Completing sit to stand with minimal assist  for ADLs mod assist stand pivot primarily for premature sitting to wheelchair and poor rolling walker management.  Completed stand pivot transfer to the toilet with use of grab bar minimal assist.  Family educated on patient's current expressive receptive language skills family asked questions regarding strategies when patient perseverates.  Family demonstrated good understanding of task to complete with patient once home and strategies to increase patient comprehension such as simple instructions repetition.  Family teaching completed and patient discharged home       Disposition: Discharged to Home    Diet: Regular  Special Instructions: No driving smoking or alcohol  Ambulatory referral obtained for urinary retention  Routine Foley catheter care  Medications at discharge 1.  Tylenol as needed 2.  Eliquis 5 mg twice daily 3.  Cardizem 60 mg every 8 hours 4.  Zetia 10 mg daily 5.  Melatonin 3 mg nightly 6.  Toprol-XL 100 mg daily 7.  Florastor 250 mg p.o. twice daily 8.  Glucophage 500 mg daily 9.  Magnesium oxide 400 mg daily 10.  Levaquin 250 mg daily x5 days  30-35 minutes were spent completing discharge summary and discharge planning  Discharge Instructions    Ambulatory referral to Neurology   Complete by: As directed    An appointment is requested in approximately 4 weeks left MCA/M2 occlusion   Ambulatory referral to Physical Medicine Rehab   Complete by: As directed    Moderate complexity follow-up 1 to 2 weeks left MCA infarction   Ambulatory referral to Urology   Complete by: As directed    Ambulatory referral requested approximately 2 to 3 weeks for urinary retention presently with Foley catheter tube  in place.  Patient and family was not able to perform in and out catheterizations       Follow-up Information    Kirsteins, Luanna Salk, MD Follow up.   Specialty: Physical Medicine and Rehabilitation Why: Office to call for appointment Contact  information: [place] [address] [phone]        Donato Heinz, MD Follow up.   Specialties: Cardiology, Radiology Why: Call for appointment Contact information: [address] [place] [address] [phone]               Signed: Cathlyn Parsons 06/06/2020, 5:16 AM

## 2020-06-05 ENCOUNTER — Inpatient Hospital Stay (HOSPITAL_COMMUNITY): Payer: Medicare Other

## 2020-06-05 ENCOUNTER — Inpatient Hospital Stay (HOSPITAL_COMMUNITY): Payer: Medicare Other | Admitting: Speech Pathology

## 2020-06-05 ENCOUNTER — Other Ambulatory Visit (HOSPITAL_COMMUNITY): Payer: Self-pay | Admitting: Physician Assistant

## 2020-06-05 ENCOUNTER — Inpatient Hospital Stay (HOSPITAL_COMMUNITY): Payer: Medicare Other | Admitting: Occupational Therapy

## 2020-06-05 LAB — BASIC METABOLIC PANEL
Anion gap: 11 (ref 5–15)
BUN: 13 mg/dL (ref 8–23)
CO2: 17 mmol/L — ABNORMAL LOW (ref 22–32)
Calcium: 9 mg/dL (ref 8.9–10.3)
Chloride: 107 mmol/L (ref 98–111)
Creatinine, Ser: 0.69 mg/dL (ref 0.44–1.00)
GFR, Estimated: >60 mL/min (ref >60)
Glucose, Bld: 112 mg/dL — ABNORMAL HIGH (ref 70–99)
Potassium: 4 mmol/L (ref 3.5–5.1)
Sodium: 135 mmol/L (ref 135–145)

## 2020-06-05 LAB — GLUCOSE, CAPILLARY
Glucose-Capillary: 117 mg/dL — ABNORMAL HIGH (ref 70–99)
Glucose-Capillary: 145 mg/dL — ABNORMAL HIGH (ref 70–99)
Glucose-Capillary: 107 mg/dL — ABNORMAL HIGH (ref 70–99)
Glucose-Capillary: 139 mg/dL — ABNORMAL HIGH (ref 70–99)

## 2020-06-05 MED ORDER — EZETIMIBE 10 MG PO TABS: 10.0000 mg | ORAL_TABLET | Freq: Every day | ORAL | 0 refills | Status: DC

## 2020-06-05 MED ORDER — METOPROLOL SUCCINATE ER 100 MG PO TB24: 100.0000 mg | ORAL_TABLET | Freq: Every day | ORAL | 0 refills | Status: DC

## 2020-06-05 MED ORDER — ACETAMINOPHEN 325 MG PO TABS: 650.0000 mg | ORAL_TABLET | ORAL | Status: DC | PRN

## 2020-06-05 MED ORDER — MELATONIN 3 MG PO TABS: 3.0000 mg | ORAL_TABLET | Freq: Every day | ORAL | 0 refills | Status: DC

## 2020-06-05 MED ORDER — SACCHAROMYCES BOULARDII 250 MG PO CAPS: 250.0000 mg | ORAL_CAPSULE | Freq: Two times a day (BID) | ORAL | 0 refills | Status: DC

## 2020-06-05 MED ORDER — VITAMIN B12 1000 MCG PO TBCR: 1000.0000 ug | EXTENDED_RELEASE_TABLET | Freq: Every day | ORAL | 0 refills | Status: DC

## 2020-06-05 MED ORDER — MAGNESIUM OXIDE 400 (241.3 MG) MG PO TABS
400.0000 mg | ORAL_TABLET | Freq: Every day | ORAL | Status: DC
  Administered 2020-06-05 – 2020-06-06 (×2): 400 mg via ORAL
  Filled 2020-06-05 (×2): qty 1

## 2020-06-05 MED ORDER — DILTIAZEM HCL ER COATED BEADS 240 MG PO CP24: 240.0000 mg | ORAL_CAPSULE | Freq: Every day | ORAL | 3 refills | Status: DC

## 2020-06-05 MED ORDER — SENNOSIDES-DOCUSATE SODIUM 8.6-50 MG PO TABS: 2.0000 | ORAL_TABLET | Freq: Two times a day (BID) | ORAL | Status: AC

## 2020-06-05 MED ORDER — APIXABAN 5 MG PO TABS: 5.0000 mg | ORAL_TABLET | Freq: Two times a day (BID) | ORAL | 0 refills | Status: DC

## 2020-06-05 MED ORDER — METFORMIN HCL 1000 MG PO TABS: ORAL_TABLET | ORAL | 0 refills | Status: DC

## 2020-06-05 MED ORDER — MAGNESIUM OXIDE 400 (241.3 MG) MG PO TABS: 400.0000 mg | ORAL_TABLET | Freq: Every day | ORAL | 0 refills | Status: AC

## 2020-06-05 MED ORDER — CHLORHEXIDINE GLUCONATE CLOTH 2 % EX PADS
6.0000 | MEDICATED_PAD | Freq: Every day | CUTANEOUS | Status: DC
  Administered 2020-06-05 – 2020-06-06 (×2): 6 via TOPICAL

## 2020-06-05 MED ORDER — IPRATROPIUM BROMIDE 0.06 % NA SOLN: 1.0000 | Freq: Two times a day (BID) | NASAL | 12 refills | Status: DC

## 2020-06-05 MED FILL — MAGNESIUM OXIDE 400 MG TABS: 400 | 30 days supply | Qty: 30 | Fill #0

## 2020-06-05 MED FILL — METFORMIN HCL 1000 MG TABS: 1000 | 30 days supply | Qty: 15 | Fill #0

## 2020-06-05 MED FILL — CARTIA XT 240 MG CAPSULE: 240 | 30 days supply | Qty: 30 | Fill #0

## 2020-06-05 MED FILL — FLORASTOR 250 MG CAPSULE: 250 | 30 days supply | Qty: 60 | Fill #0

## 2020-06-05 MED FILL — METOPROLOL SUCCINATE ER 100: 100 | 30 days supply | Qty: 30 | Fill #0

## 2020-06-05 MED FILL — MELATONIN 3 MG TABS: 3 | 30 days supply | Qty: 30 | Fill #0

## 2020-06-05 MED FILL — IPRATROPIUM 0.06% SPRAY: 0.06 | 15 days supply | Qty: 15 | Fill #0

## 2020-06-05 MED FILL — EZETIMIBE 10 MG TABS: 10 | 30 days supply | Qty: 30 | Fill #0

## 2020-06-05 MED FILL — VITAMIN B-12 1000 MCG TABS: 1000 | 30 days supply | Qty: 30 | Fill #0

## 2020-06-05 MED FILL — ELIQUIS 5 MG TABLET: 5 | 30 days supply | Qty: 60 | Fill #0

## 2020-06-05 NOTE — Progress Notes (Signed)
Patient ID: Sheila Ray, female   DOB: [birth year], 79 y.o.   MRN: [id number]   [facility] declined due to staffing.   [place], [place]

## 2020-06-05 NOTE — Progress Notes (Signed)
Occupational Therapy Session Note  Patient Details  Name: Sheila Ray MRN: [id number] Date of Birth: [birth year]  Today's Date: 06/05/2020 OT Individual Time: 4103-0131 OT Individual Time Calculation (min): 42 min    Short Term Goals: Week 3:  OT Short Term Goal 1 (Week 3): STG= LTG d/t ELOS    Skilled Therapeutic Interventions/Progress Updates:    Pt supine in bed, pleasantly confused, no signs of pain, agreeable to OT session.  Pt completed supine to sit with min assist. Pt doffed shirt at EOB with supervision and increased time.  Pt bathed UB with min assist for back.  Pt completed sit<>stand at [facility] with min assist and increased time and doffed pants and brief with min assist.  Pt bathed LB with min assist for buttocks in standing due to pt attempting and unable to reach for thoroughness.  Pt donned shirt with supervision and increased time.  Pt donned brief and pants with min assist and VCs for problem solving.  Pt donned socks with max assist needed right foot and min assist left foot.  Pt completed sit to stand at [facility] with min assist and ambulated around bed to recliner with CGA.  Stand to sit completed with min assist.  Call bell in reach, BLE elevated, seat belt alarm on.   Therapy Documentation Precautions:  Precautions Precautions: Fall Precaution Comments: R inattention, expressive aphasia Restrictions Weight Bearing Restrictions: No   Therapy/Group: Individual Therapy  Ezekiel Slocumb 06/05/2020, 4:19 PM

## 2020-06-05 NOTE — Progress Notes (Signed)
Physical Therapy Session Note  Patient Details  Name: Sheila Ray MRN: [id number] Date of Birth: [birth year]  Today's Date: 06/05/2020 PT Individual Time: 0800-0900 PT Individual Time Calculation (min): 60 min   Short Term Goals: Week 3:  PT Short Term Goal 1 (Week 3): STG=LTG due to ELOS  Skilled Therapeutic Interventions/Progress Updates:   Patient received sitting up in bed finishing breakfast, agreeable to PT. She denies pain, but appears very confused. Asking PT multiple times why she's here, where she is, repeatedly stating "I just don't know, I don't know." When asked where she was, patient stated she was home. When asked what year it was, patient responded "2, 2, 4, 4, 2." PT inquiring what those numbers meant and patient stated that was her home address. PT reorienting patient to place, time and situation. Patient requiring reorientation to rehab, her LOS, goals and d/c planning at this time too. RN aware of patients level of confusion. Patient able to come to sit edge of bed with MinA and verbal cues for sequencing. CGA/MinA provided for postural stability while patient doffed one shirt and donner another. Patient requiring ModA to come to standing to doff pants. Max verbal and tactile cues needed to attend to R LE to complete doffing pants. ModA to don pants in sitting, completed in standing. Patient persisted with R inattention throughout therapy session requiring Max verbal and tactile cuing to attend to [place] stand pivot to wc. MaxA wc propulsion as patient was unable to successfully motor plan and sequence/coordinate B UE movements. Patient able to transfer into sedan-height car with [place] due to R LE inattention/decreased AROM to complete transfer of B LE into car. Once in the car, patient becoming increasingly confused about whether she was going home/could go home. PT attempting to reorient patient at this time. She was able to ambulate 71ft with MinA and RW. She was unable to  motor plan retro stepping with R LE complicating turns/returning to wc. One instance, PT needed to manually facilitate retro step with R LE in order to sit safely. Patient returning to room in wc, seatbelt alarm on, call light within reach.   Therapy Documentation Precautions:  Precautions Precautions: Fall Restrictions Weight Bearing Restrictions: No    Therapy/Group: Individual Therapy  Karoline Caldwell, PT, DPT, CBIS  06/05/2020, 7:34 AM

## 2020-06-05 NOTE — Progress Notes (Signed)
Occupational Therapy Session Note  Patient Details  Name: Sheila Ray MRN: [id number] Date of Birth: 08-30-1940  Today's Date: 06/05/2020 OT Individual Time: 1015-1055 OT Individual Time Calculation (min): 40 min   Skilled Therapeutic Interventions/Progress Updates:    Pt greeted while in care of NTs, transferring to the toilet via [place]. While sitting pt had continent B+B void. Worked on functional sit<stands with the RW while completing toileting tasks, pt requiring Min A for power up from higher surface. Mod A for stand pivot<w/c using RW for controlled sit. She then engaged in hand washing, oral care, and hair brushing while sitting at the sink. Note that pt required more vcs for sequencing and Rt attention today than during previous sessions with this therapist. It took 4 attempts for pt to stand in order to transfer back to bed, pt repeating "I can't" when therapist was providing her with visual/demonstrational and/or simple verbal cues. After, pt able to complete a stand pivot<bed with Mod A using RW. Min A to transition to supine. Pt visibly exhausted. Left her with all needs within reach and bed alarm set. Tx focus placed on sit<stands, functional transfers, functional cognition, praxis, and ADL retraining.   Therapy Documentation Precautions:  Precautions Precautions: Fall Precaution Comments: R inattention, expressive aphasia Restrictions Weight Bearing Restrictions: No Pain: pt denied having pain during session   ADL: ADL Grooming: Minimal assistance Where Assessed-Grooming: Sitting at sink, Wheelchair Upper Body Bathing: Moderate assistance Where Assessed-Upper Body Bathing: Shower Lower Body Bathing: Dependent Where Assessed-Lower Body Bathing: Shower Upper Body Dressing: Moderate assistance Where Assessed-Upper Body Dressing: Wheelchair Lower Body Dressing: Dependent Where Assessed-Lower Body Dressing: Wheelchair Toileting: Dependent Where Assessed-Toileting:  Glass blower/designer: Dependent, Maximal assistance, Other (comment) (via stedy) Toilet Transfer Method: Other (comment) (via stedy) Science writer: Raised toilet seat Social research officer, government: Maximal assistance, Dependent Social research officer, government Method: Other (comment) (via stedy) Youth worker: Gaffer Baseline Vision/History: Wears glasses Wears Glasses: Distance only Patient Visual Report: Blurring of vision Perception  Perception: Impaired Inattention/Neglect: Does not attend to right visual field;Does not attend to right side of body Praxis Praxis: Impaired Praxis Impairment Details: Motor planning;Initiation Praxis-Other Comments: improved from eval, but deficits persist     Therapy/Group: Individual Therapy  Tymeshia Awan A Ah Bott 06/05/2020, 12:24 PM

## 2020-06-05 NOTE — Progress Notes (Signed)
Patient ID: Sheila Ray, female   DOB: [birth year], 79 y.o.   MRN: [id number]   [facility] has accepted patient for Sn/pt/ot/sph  [name], [place]

## 2020-06-05 NOTE — Progress Notes (Signed)
Patient ID: Sheila Ray, female   DOB: [birth year], 79 y.o.   MRN: [id number]   SW received phone call from patient son, patient will now discharge home with [facility].   [place], [place]

## 2020-06-05 NOTE — Progress Notes (Signed)
Speech Language Pathology Daily Session Note  Patient Details  Name: Sheila Ray MRN: [id number] Date of Birth: Jul 28, 1940  Today's Date: 06/05/2020 SLP Individual Time: 9741-6384 SLP Individual Time Calculation (min): 55 min  Short Term Goals: Week 3: SLP Short Term Goal 1 (Week 3): STG=LTG due to remaining length of stay  Skilled Therapeutic Interventions: Pt was seen for skilled ST targeting cognitive and speech/language goals. Portions of the SLUMS evaluation were administered with some accommodations/modifications for language deficits in order to re-assess level of current cognitive functioning. She required Min-Mod cueing throughout a clock drawing, visual cues (number written) in order to repeat #s in reverse # (up to 4 numbers with Min A cues). She also performed simple addition problems with Supervision, although increased cueing required for subtraction. During naming tasks, pt with 100% accuracy on responsive naming, and 90% accuracy with confrontation. A total of 7 minutes and Moderate semantic and sometimes visual cueing required for pt to name 10 items within a category (divergent naming). She did exhibit increased difficulty with auditory comprehension as well as verbal expression today, although this is not typical (to this degree) for pt. When SLP asked about it, the pt nodded in agreement that she was having more difficulty understanding and talking today in comparison to a "normal" day, and stated "I just don't know why." Pt did use gestures and phrases to communicate buttock pain during session; therefore, SLP and RN assisted by transferring her from recliner back to bed. Pt left laying in bed with alarm set and needs within reach. Continue per current plan of care.          Pain Pain Assessment Pain Scale: Faces Faces Pain Scale: Hurts little more Pain Type: Acute pain Pain Location: Buttocks Pain Descriptors / Indicators: Grimacing;Discomfort Pain Onset: Gradual Pain  Intervention(s): Repositioned Multiple Pain Sites: No  Therapy/Group: Individual Therapy  Arbutus Leas 06/05/2020, 7:16 AM

## 2020-06-05 NOTE — Progress Notes (Signed)
Patient ID: Sheila Ray, female   DOB: 1941-05-02, 79 y.o.   MRN: [id number]   Patient declined by [facility], due to service area.  [place], [place]

## 2020-06-05 NOTE — Progress Notes (Signed)
Patient ID: Sheila Ray, female   DOB: 22-Aug-1940, 79 y.o.   MRN: [id number]   Family will be present tomorrow 8-10AM to complete OT and nursing family education.  [place], [place]

## 2020-06-05 NOTE — Progress Notes (Signed)
Patient ID: Sheila Ray, female   DOB: May 27, 1941, 79 y.o.   MRN: [id number]  Patient declined by [facility] due to staffing  [place], [place]

## 2020-06-05 NOTE — Progress Notes (Signed)
[place] PHYSICAL MEDICINE & REHABILITATION PROGRESS NOTE   Subjective/Complaints: Family has decided to take pt home  Pt remains aphasic  ROS: Patient aphasic  Objective:   No results found. Recent Labs    06/02/20 1257  WBC 10.0  HGB 13.1  HCT 44.3  PLT 387   Recent Labs    06/02/20 1257 06/05/20 0633  NA 133* 135  K 4.3 4.0  CL 101 107  CO2 18* 17*  GLUCOSE 159* 112*  BUN 10 13  CREATININE 0.84 0.69  CALCIUM 9.3 9.0    Intake/Output Summary (Last 24 hours) at 06/05/2020 1048 Last data filed at 06/05/2020 0700 Gross per 24 hour  Intake 340 ml  Output 850 ml  Net -510 ml        Physical Exam: Vital Signs Blood pressure (!) 103/58, pulse 69, temperature 98.4 F (36.9 C), resp. rate 16, height 5\' 4"  (1.626 m), weight 61.7 kg, SpO2 99 %.    General: No acute distress Mood and affect are appropriate Heart: Regular rate and rhythm no rubs murmurs or extra sounds Lungs: Clear to auscultation, breathing unlabored, no rales or wheezes Abdomen: Positive bowel sounds, soft nontender to palpation, nondistended Extremities: No clubbing, cyanosis, or edema Skin: No evidence of breakdown, no evidence of rash  Neuro: Alert, cannot assess orientation due to word finding/substitution issues  Expressive >receptive aphasia--perhaps a bit more fluid Motor: LUE/LLE: 5/5 proximal distal RUE/RLE: 4-4+/5 proximal distal   Assessment/Plan: 1. Functional deficits which require 3+ hours per day of interdisciplinary therapy in a comprehensive inpatient rehab setting.  Physiatrist is providing close team supervision and 24 hour management of active medical problems listed below.  Physiatrist and rehab team continue to assess barriers to discharge/monitor patient progress toward functional and medical goals  Care Tool:  Bathing  Bathing activity did not occur: Refused Body parts bathed by patient: Right arm, Left arm, Chest, Abdomen, Front perineal area, Right upper leg,  Left upper leg, Face   Body parts bathed by helper: Front perineal area, Buttocks, Right lower leg, Left lower leg     Bathing assist Assist Level: Moderate Assistance - Patient 50 - 74%     Upper Body Dressing/Undressing Upper body dressing   What is the patient wearing?: Pull over shirt    Upper body assist Assist Level: Minimal Assistance - Patient > 75%    Lower Body Dressing/Undressing Lower body dressing      What is the patient wearing?: Pants     Lower body assist Assist for lower body dressing: Moderate Assistance - Patient 50 - 74%     Toileting Toileting Toileting Activity did not occur Landscape architect and hygiene only): Refused  Toileting assist Assist for toileting: Moderate Assistance - Patient 50 - 74%     Transfers Chair/bed transfer  Transfers assist     Chair/bed transfer assist level: Contact Guard/Touching assist     Locomotion Ambulation   Ambulation assist   Ambulation activity did not occur: Safety/medical concerns  Assist level: Minimal Assistance - Patient > 75% Assistive device: Walker-rolling Max distance: 50   Walk 10 feet activity   Assist  Walk 10 feet activity did not occur: Safety/medical concerns  Assist level: Minimal Assistance - Patient > 75% Assistive device: Walker-rolling   Walk 50 feet activity   Assist Walk 50 feet with 2 turns activity did not occur: Safety/medical concerns  Assist level: Minimal Assistance - Patient > 75%      Walk 150 feet activity  Assist Walk 150 feet activity did not occur: Safety/medical concerns         Walk 10 feet on uneven surface  activity   Assist Walk 10 feet on uneven surfaces activity did not occur: Safety/medical concerns   Assist level: Minimal Assistance - Patient > 75% Assistive device: Hand held assist   Wheelchair     Assist Will patient use wheelchair at discharge?: Yes Type of Wheelchair: Manual    Wheelchair assist level: Minimal  Assistance - Patient > 75% Max wheelchair distance: 166ft    Wheelchair 50 feet with 2 turns activity    Assist        Assist Level: Minimal Assistance - Patient > 75%   Wheelchair 150 feet activity     Assist      Assist Level: Moderate Assistance - Patient 50 - 74%   Blood pressure (!) 103/58, pulse 69, temperature 98.4 F (36.9 C), resp. rate 16, height 5\' 4"  (1.626 m), weight 61.7 kg, SpO2 99 %.  Medical Problem List and Plan: 1.  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  Antithrombotics: -DVT/anticoagulation: Bilateral lower extremity Dopplers negative for DVT.  Eliquis             -antiplatelet therapy: N/A 3. Pain Management: Tylenol as needed. Complains of neck discomfort:   K pad  Appears controlled with meds/interventions on 11/29 4. Mood: Provide emotional support             -antipsychotic agents: N/A 5. Neuropsych: This patient is not capable of making decisions on her own behalf. 6. Skin/Wound Care: Routine skin checks Augmentin for cellulitis thru 11/18 7. Fluids/Electrolytes/Nutrition: - intake ~25-50% meals  -encourage PO. Was eating breakfast fairly vigorously when I was in room 8.  Atrial fibrillation.  Cardizem 60 mg every 8 hours, Toprol-XL 100 mg daily.   9.  Diabetes mellitus with hyperglycemia.  Hemoglobin A1c 6.4.  SSI.  Patient on Glucophage 500 mg daily prior to admission.  Resume as needed CBG (last 3)  Recent Labs    06/04/20 1636 06/04/20 2059 06/05/20 0624  GLUCAP 129* 137* 117*  Well controlled 12/6 10.Hypertension.  Monitor with increased mobility.  Patient on lisinopril 40 mg daily prior to admission.  Resume as needed.  Vitals:   06/04/20 2030 06/05/20 0515  BP: 92/61 (!)  103/58  Pulse: 78 69  Resp: 16 16  Temp: 97.6 F (36.4 C) 98.4 F (36.9 C)  SpO2: 99% 99%  controlled 12/6 11. Hyperlipidemia.  Zetia 12.  Left lower extremity cellulitis.  Venous Doppler studies negative.    Completed course of Augmentin.  13.  Hypo K   Continue KCL, last K+ normal 3.8 on 11/26 repeat Monday    14.  Leukocytosis resolved 15.  Mild diastolic dysfunction, mod /severe tricuspid regurg, increased LE edema, chest x-ray shows pleural effusions diuresed for 3 days Filed Weights   05/31/20 0454 06/04/20 0500 06/05/20 0515  Weight: 66.5 kg 62 kg 61.7 kg   Stable  16. Constipation  Last BM 12/1, adjust laxatives 17.  Hyponatremia  resolved  Continue to monitor  18.  Acute lower UTI  UA +  Urine culture with >100K gram neg rods, Citrobacter koseri S to nitrofurantoin  Macrobid started 11/27-completed 7d course  12/4 -urine reported as cloudy again, tea colored   -re-check UA  was suspicious  12/5 UCX still pending.  19. Neurogenic bladder:  I/O caths freq increased, urinary retention , BP soft so avoid flomax, will trial low dose urecholine increase to QID 12/1, increased to 25mg  TID    -still requiring I/O caths  foley at discharge uro f/uLOS: 21 days A FACE TO [place] E Sheila Ray 06/05/2020, 10:48 AM

## 2020-06-05 NOTE — Progress Notes (Signed)
Speech Language Pathology Discharge Summary  Patient Details  Name: Sheila Ray MRN: [id number] Date of Birth: [birth year]   Patient has met 4 of 5 long term goals.  Patient to discharge at [facility] level.  Reasons goals not met: increased Moderate cueing required for word finding outside of basic wants and needs   Clinical Impression/Discharge Summary:   Pt made functional gains and met 4 out of 5 long term goals this admission. Pt currently requires Min-Mod assist for functional verbal communication due to mixed expressive and receptive aphasia (expressive deficits more severe than receptive). She can express her basic wants and needs with not more than Min A verbal and visual cueing for clarification, however increased Moderate cues (written, sentence completion, phonemic) are required for word finding in other conversation and structured language tasks. Visual cues and breaking directions down into simpler form aid in her comprehension of complex or 2+ step directions. She also presents with mild cognitive deficits impacting her attention to tasks, basic problem solving, short term recall, and emergent awareness. She exhibits somewhat poor frustration tolerance as well. As a result, pt will require 24/7 supervision and assistance at discharge for greatest safety. She has demonsrtated improvements in her verbal communication of basic wants and needs, word finding, basic familiar problem solving, attention and awareness, however, given cognitive-linguistic deficits still present and impacting daily function, recommend pt continue to receive skilled ST services upon discharge. Pt and family education is complete at this time.    Care Partner:  Caregiver Able to Provide Assistance: Yes  Type of Caregiver Assistance: Cognitive  Recommendation:  24 hour supervision/assistance;Outpatient SLP  Rationale for SLP Follow Up: Maximize functional communication;Maximize cognitive function and  independence;Reduce caregiver burden   Equipment: none   Reasons for discharge: Discharged from hospital   Patient/Family Agrees with Progress Made and Goals Achieved: Yes    Arbutus Leas 06/05/2020, 12:54 PM

## 2020-06-06 ENCOUNTER — Encounter (HOSPITAL_COMMUNITY): Payer: Medicare Other | Admitting: Occupational Therapy

## 2020-06-06 ENCOUNTER — Other Ambulatory Visit (HOSPITAL_COMMUNITY): Payer: Self-pay | Admitting: Physician Assistant

## 2020-06-06 LAB — URINE CULTURE: Culture: 80000 — AB

## 2020-06-06 LAB — GLUCOSE, CAPILLARY
Glucose-Capillary: 105 mg/dL — ABNORMAL HIGH (ref 70–99)
Glucose-Capillary: 141 mg/dL — ABNORMAL HIGH (ref 70–99)

## 2020-06-06 MED ORDER — LEVOFLOXACIN 250 MG PO TABS
250.0000 mg | ORAL_TABLET | Freq: Every day | ORAL | Status: DC
  Administered 2020-06-06: 250 mg via ORAL
  Filled 2020-06-06: qty 1

## 2020-06-06 MED ORDER — LEVOFLOXACIN 250 MG PO TABS: 250.0000 mg | ORAL_TABLET | Freq: Every day | ORAL | 0 refills | Status: DC

## 2020-06-06 MED FILL — levoFLOXacin 250 MG TABS: 250 | 5 days supply | Qty: 5 | Fill #0

## 2020-06-06 NOTE — Progress Notes (Signed)
Patient and family educated on foley care and s/sx  Of infection/issues to monitor. Educational handout given for supplement. Family verbalized understanding and teachback.

## 2020-06-06 NOTE — Progress Notes (Signed)
[place] PHYSICAL MEDICINE & REHABILITATION PROGRESS NOTE   Subjective/Complaints:  Remains aphasic, family is in the room and has questions about bladder, antibiotics, urology follow-up as well as home health. ROS: Patient aphasic  Objective:   No results found. No results for input(s): WBC, HGB, HCT, PLT in the last 72 hours. Recent Labs    06/05/20 0633  NA 135  K 4.0  CL 107  CO2 17*  GLUCOSE 112*  BUN 13  CREATININE 0.69  CALCIUM 9.0    Intake/Output Summary (Last 24 hours) at 06/06/2020 0905 Last data filed at 06/06/2020 3557 Gross per 24 hour  Intake 540 ml  Output 400 ml  Net 140 ml        Physical Exam: Vital Signs Blood pressure 122/64, pulse 62, temperature 98 F (36.7 C), resp. rate 19, height 5\' 4"  (1.626 m), weight 61.5 kg, SpO2 99 %.     General: No acute distress Mood and affect are appropriate Heart: Regular rate and rhythm no rubs murmurs or extra sounds Lungs: Clear to auscultation, breathing unlabored, no rales or wheezes Abdomen: Positive bowel sounds, soft nontender to palpation, nondistended Extremities: No clubbing, cyanosis, or edema Skin: No evidence of breakdown, no evidence of rash  Neuro: Alert, cannot assess orientation due to word finding/substitution issues  Expressive >receptive aphasia--perhaps a bit more fluid Motor: LUE/LLE: 5/5 proximal distal RUE/RLE: 4-4+/5 proximal distal   Assessment/Plan: 1. Functional deficits Stable for D/C today F/u PCP in 3-4 weeks F/u PM&R 2 weeks Follow-up with urology in 2 to 3 weeks See D/C summary See D/C instructions  Care Tool:  Bathing  Bathing activity did not occur: Refused Body parts bathed by patient: Right arm, Left arm, Chest, Abdomen, Front perineal area, Right upper leg, Left upper leg, Face, Right lower leg, Left lower leg   Body parts bathed by helper: Buttocks     Bathing assist Assist Level: Minimal Assistance - Patient > 75%     Upper Body  Dressing/Undressing Upper body dressing   What is the patient wearing?: Pull over shirt    Upper body assist Assist Level: Supervision/Verbal cueing    Lower Body Dressing/Undressing Lower body dressing      What is the patient wearing?: Pants, Incontinence brief     Lower body assist Assist for lower body dressing: Minimal Assistance - Patient > 75%     Toileting Toileting Toileting Activity did not occur (Clothing management and hygiene only): Refused  Toileting assist Assist for toileting: Moderate Assistance - Patient 50 - 74%     Transfers Chair/bed transfer  Transfers assist     Chair/bed transfer assist level: Contact Guard/Touching assist     Locomotion Ambulation   Ambulation assist   Ambulation activity did not occur: Safety/medical concerns  Assist level: Minimal Assistance - Patient > 75% Assistive device: Walker-rolling Max distance: 50   Walk 10 feet activity   Assist  Walk 10 feet activity did not occur: Safety/medical concerns  Assist level: Minimal Assistance - Patient > 75% Assistive device: Walker-rolling   Walk 50 feet activity   Assist Walk 50 feet with 2 turns activity did not occur: Safety/medical concerns  Assist level: Minimal Assistance - Patient > 75%      Walk 150 feet activity   Assist Walk 150 feet activity did not occur: Safety/medical concerns         Walk 10 feet on uneven surface  activity   Assist Walk 10 feet on uneven surfaces activity did not  occur: Safety/medical concerns   Assist level: Minimal Assistance - Patient > 75% Assistive device: Hand held assist   Wheelchair     Assist Will patient use wheelchair at discharge?: Yes Type of Wheelchair: Manual    Wheelchair assist level: Minimal Assistance - Patient > 75% Max wheelchair distance: 168ft    Wheelchair 50 feet with 2 turns activity    Assist        Assist Level: Minimal Assistance - Patient > 75%   Wheelchair 150 feet  activity     Assist      Assist Level: Moderate Assistance - Patient 50 - 74%   Blood pressure 122/64, pulse 62, temperature 98 F (36.7 C), resp. rate 19, height 5\' 4"  (1.626 m), weight 61.5 kg, SpO2 99 %.  Medical Problem List and Plan: 1.  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  Antithrombotics: -DVT/anticoagulation: Bilateral lower extremity Dopplers negative for DVT.  Eliquis             -antiplatelet therapy: N/A 3. Pain Management: Tylenol as needed. Complains of neck discomfort:   K pad  Appears controlled with meds/interventions on 11/29 4. Mood: Provide emotional support             -antipsychotic agents: N/A 5. Neuropsych: This patient is not capable of making decisions on her own behalf. 6. Skin/Wound Care: Routine skin checks Augmentin for cellulitis thru 11/18 7. Fluids/Electrolytes/Nutrition: - intake ~25-50% meals  -encourage PO. Was eating breakfast fairly vigorously when I was in room 8.  Atrial fibrillation.  Cardizem 60 mg every 8 hours, Toprol-XL 100 mg daily.   9.  Diabetes mellitus with hyperglycemia.  Hemoglobin A1c 6.4.  SSI.  Patient on Glucophage 500 mg daily prior to admission.  Resume as needed CBG (last 3)  Recent Labs    06/05/20 1627 06/05/20 2103 06/06/20 0614  GLUCAP 107* 139* 105*  Well controlled 12/6 10.Hypertension.  Monitor with increased mobility.  Patient on lisinopril 40 mg daily prior to admission.  Resume as needed.  Vitals:   06/05/20 2038 06/06/20 0356  BP: (!) 105/56 122/64  Pulse: 65 62  Resp: 16 19  Temp: 98.1 F (36.7 C) 98 F (36.7 C)  SpO2: 99% 99%  controlled 12/6 11. Hyperlipidemia.  Zetia 12.  Left lower extremity cellulitis.  Venous Doppler studies negative.     Completed course of Augmentin.  13.  Hypo K   Continue KCL, last K+ normal 3.8 on 11/26 repeat Monday    14.  Leukocytosis resolved 15.  Mild diastolic dysfunction, mod /severe tricuspid regurg, increased LE edema, chest x-ray shows pleural effusions diuresed for 3 days Filed Weights   06/04/20 0500 06/05/20 0515 06/06/20 0514  Weight: 62 kg 61.7 kg 61.5 kg   Stable  16. Constipation  Last BM 12/1, adjust laxatives 17.  Hyponatremia  resolved  Continue to monitor  18.  Acute lower UTI  UA +  Urine culture with >100K gram neg rods, Citrobacter koseri S to nitrofurantoin   Macrobid started 11/27-completed 7d course  12/4 -urine reported as cloudy again, tea colored   -re-check UA  was suspicious  12/5 UCX still pending.  19. Neurogenic bladder:With recurrent UTI 80K Pseudomonas, will give 5 d tx with  levaquin  I/O caths freq increased, urinary retention , BP soft so avoid flomax, will trial low dose urecholine increase to QID 12/1, increased to 25mg  TID    -still requiring I/O caths  foley at discharge uro f/uLOS: 22 days A FACE TO [place] E Lanesha Azzaro 06/06/2020, 9:05 AM

## 2020-06-06 NOTE — Progress Notes (Signed)
Patient ID: Sheila Ray, female   DOB: [birth year], 79 y.o.   MRN: [id number]   [place] decline due to staffing.   [place], [place]

## 2020-06-06 NOTE — Progress Notes (Signed)
Occupational Therapy Discharge Summary  Patient Details  Name: Sheila Ray MRN: [id number] Date of Birth: 1941/05/27    Patient has met 6 of 12 long term goals due to improved activity tolerance, improved balance, postural control, ability to compensate for deficits, functional use of  RIGHT upper extremity and improved awareness.  Patient to discharge at [facility] Assist level.  Patient's care partner is independent to provide the necessary physical and cognitive assistance at discharge.  Patient's son and daughter in law present for family education and completed hands on training, demonstrating good carryover of education and proper hand placement to facilitate improved independence and safety with ADLs and functional mobility.  Due to change in d/c plan from LTC at [facility] to home with family, pt did not meet some goals as family felt that they could provide for her at a min assist level.   Reasons goals not met: Pt continues to require supervision for sitting balance, min assist for sit > stand and standing balance, and supervision for eating and grooming tasks.    Recommendation:  Patient will benefit from ongoing skilled OT services in home health setting to continue to advance functional skills in the area of BADL and Reduce care partner burden.  Equipment: 3 in 1  Reasons for discharge: discharge from hospital  Patient/family agrees with progress made and goals achieved: Yes  OT Discharge Precautions/Restrictions  Precautions Precautions: Fall Precaution Comments: R inattention, expressive aphasia Pain Pain Assessment Pain Scale: 0-10 Pain Score: 0-No pain ADL ADL Eating: Supervision/safety Grooming: Supervision/safety Where Assessed-Grooming: Sitting at sink, Wheelchair Upper Body Bathing: Supervision/safety Where Assessed-Upper Body Bathing: Shower Lower Body Bathing: Minimal assistance Where Assessed-Lower Body Bathing: Shower Upper Body Dressing:  Supervision/safety Where Assessed-Upper Body Dressing: Edge of bed Lower Body Dressing: Minimal assistance Where Assessed-Lower Body Dressing: Edge of bed Toileting: Minimal assistance Where Assessed-Toileting: Teacher, adult education: Curator Method: Proofreader: Bedside commode, Grab bars Tub/Shower Transfer: Minimal Radiation protection practitioner Method: Ship broker: Insurance underwriter: Maximal assistance, Dependent Film/video editor Method: Other (comment) (via stedy) Astronomer: Sales promotion account executive Baseline Vision/History: Wears glasses Wears Glasses: Distance only Patient Visual Report: Blurring of vision Vision Assessment?: Yes Eye Alignment: Within Functional Limits Ocular Range of Motion: Within Functional Limits Alignment/Gaze Preference: Within Defined Limits Tracking/Visual Pursuits: Able to track stimulus in all quads without difficulty Perception  Perception: Impaired Inattention/Neglect: Does not attend to right visual field;Does not attend to right side of body Praxis Praxis: Impaired Praxis Impairment Details: Motor planning;Initiation Cognition Overall Cognitive Status: Impaired/Different from baseline Arousal/Alertness: Awake/alert Memory: Impaired Awareness: Impaired Problem Solving: Impaired Sequencing: Impaired Behaviors: Perseveration Safety/Judgment: Impaired Comments: low frustration tolerance Sensation Sensation Light Touch: Impaired by gross assessment (R LE/UE) Hot/Cold: Not tested Proprioception: Impaired by gross assessment Stereognosis: Not tested Coordination Gross Motor Movements are Fluid and Coordinated: No Fine Motor Movements are Fluid and Coordinated: No Coordination and Movement Description: ataxia on the RLE and RUE Finger Nose Finger Test: right with significant dysmetria, left grossly [facility] Heel Shin Test:  unable to follow commands to complete task on R LE Motor  Motor Motor: Hemiplegia;Abnormal tone;Motor perseverations Motor - Skilled Clinical Observations: increased tone right LE Motor - Discharge Observations: R sided hemiplegia Mobility  Bed Mobility Bed Mobility: Rolling Right;Rolling Left;Supine to Sit;Sitting - Scoot to [place] of Bed;Scooting to [facility];Sit to Supine Rolling Right: Minimal Assistance - Patient > 75% Rolling Left: Minimal Assistance - Patient >  75% Supine to Sit: Minimal Assistance - Patient > 75% Sitting - Scoot to Edge of Bed: Total Assistance - Patient < 25%;Maximal Assistance - Patient 25-49% Sit to Supine: Minimal Assistance - Patient > 75% Transfers Sit to Stand: Moderate Assistance - Patient 50-74% Stand to Sit: Minimal Assistance - Patient > 75%  Trunk/Postural Assessment  Cervical Assessment Cervical Assessment: Exceptions to [facility] (forwad head carriage) Thoracic Assessment Thoracic Assessment: Exceptions to [facility] (kyphotic) Lumbar Assessment Lumbar Assessment: Exceptions to [facility] (posterior pelvic tilt) Postural Control Postural Control: Deficits on evaluation (rigid trunk with mild R lateral lean)  Balance Balance Balance Assessed: Yes Static Sitting Balance Static Sitting - Balance Support: Bilateral upper extremity supported;Feet supported Static Sitting - Level of Assistance: 6: Modified independent (Device/Increase time) (with max VC to complete and extra time) Dynamic Sitting Balance Dynamic Sitting - Balance Support: Feet supported;Right upper extremity supported Dynamic Sitting - Level of Assistance: 5: Stand by assistance Dynamic Sitting - Balance Activities: Lateral lean/weight shifting;Trunk control activities Static Standing Balance Static Standing - Balance Support: Bilateral upper extremity supported Static Standing - Level of Assistance: 5: Stand by assistance Dynamic Standing Balance Dynamic Standing - Balance Support: Bilateral upper  extremity supported;During functional activity Dynamic Standing - Level of Assistance: 4: Min assist Extremity/Trunk Assessment RUE Assessment Passive Range of Motion (PROM) Comments: proximal 3/4, distal WFL Active Range of Motion (AROM) Comments: shoulder to 90, distal WFL - able to hold and use items for adl tasks with mild difficulty LUE Assessment LUE Assessment: Within Functional Limits   Beanca Kiester, [facility] 06/06/2020, 12:38 PM

## 2020-06-06 NOTE — Progress Notes (Signed)
Occupational Therapy Session Note  Patient Details  Name: Sheila Ray MRN: [id number] Date of Birth: [birth year]  Today's Date: 06/06/2020 OT Individual Time: 1000-1100 OT Individual Time Calculation (min): 60 min    Short Term Goals: Week 3:  OT Short Term Goal 1 (Week 3): STG= LTG d/t ELOS  Skilled Therapeutic Interventions/Progress Updates:    Treatment session with focus on hands on family education in preparation for d/c.  Pt received supine in bed with son and daughter-in-law present for education.  Pt declined bathing but agreeable to dressing and completing transfers with family.  Pt completed bed mobility with min assist with cues for sequencing of bed mobility.  Completed dressing with assistance to thread RLE in to pants while educating family on hemi-dressing technique and threading catheter bag through pant leg.  Min assist sit > stand and pt able to pull pants over hips while therapist assisted with managing standing balance.  Educated on elastic laces in shoes for increased independence, pt requiring min assist for footwear this session.  Pt completed sit > stand and ambulatory transfers to bathroom with RW with daughter in law providing physical assistance.  Therapist educated on hand placement to ensure proper facilitation and safety with mobility.  Pt's daughter in law assisted with clothing management post toileting to don clean incontinence brief.  Discussed incontinence diapers vs pullups for home use.  Completed tub/shower transfer in ADL apt bathroom with therapist demonstrating transfer technique.  Discussed pt's activity tolerance and recommended planning therapy sessions, outings, and even showering around times of increased activity tolerance and allowing rest breaks during day.  Pt's son completed stand pivot transfers bed <> w/c providing min assist with good sequencing and positioning.  Pt returned to supine and left with all needs in reach.  Pt's son and daughter in  law asking appropriate questions and report pleased with education session.  Son and daughter in law demonstrated good safety awareness and hand positioning when assisting with mobility and self-care tasks.  Therapy Documentation Precautions:  Precautions Precautions: Fall Precaution Comments: R inattention, expressive aphasia Restrictions Weight Bearing Restrictions: No Pain:  Pt with no c/o pain   Therapy/Group: Individual Therapy  Simonne Come 06/06/2020, 12:27 PM

## 2020-06-06 NOTE — Progress Notes (Signed)
Patient ID: Sheila Ray, female   DOB: 11/22/1940, 79 y.o.   MRN: [id number]   [place] declined patient due to no staffing  [name], [place]

## 2020-06-06 NOTE — Progress Notes (Signed)
Patient ID: Sheila Ray, female   DOB: 1940/07/30, 79 y.o.   MRN: [id number]   SW information sent to [facility] for private nursing care for foley at home. Alternatively in case a company does not pick up patient for home health. Family informed. Company will reach out to family.  [place], [place]

## 2020-06-06 NOTE — Progress Notes (Signed)
Patient ID: Sheila Ray, female   DOB: 1940-08-07, 79 y.o.   MRN: [id number]  [facility] has accepted patient for [facility] follow up: SN/PT/OT/SPH  [name], [place]

## 2020-06-06 NOTE — Progress Notes (Signed)
Patient ID: Sheila Ray, female   DOB: May 07, 1941, 79 y.o.   MRN: [id number]   Sw updated by [facility] that they would have to pull back on referral due to patient zip and staffing in area. Sw will continue to look for home health.   [place], [place]

## 2020-06-06 NOTE — Progress Notes (Signed)
Patient ID: Sheila Ray, female   DOB: 07/09/1940, 79 y.o.   MRN: [id number]   Referral sent to [place], [place]

## 2020-06-06 NOTE — Progress Notes (Addendum)
Patient ID: Sheila Ray, female   DOB: 1941/03/13, 79 y.o.   MRN: [id number]   Patient referral sent to [facility], [facility], [place], [place], [place], [place] and [facility]. SW waiting on follow up.   Sw informed family once sw recieves decision from company sw will follow up.   [place], [place]

## 2020-06-06 NOTE — Consult Note (Signed)
[facility] CM Inpatient Consult   06/06/2020  Hildy JOLYSSA MCJUNKIN 09-15-1940 [id number]   Referral:  Inpatient Rehab Case Manager  [facility] [[facility]]  Accountable Care Organization [ACO] Patient: [employer] Medicare  Patient was referred for [employer] [facility] [[facility]]  Care Management follow up services. Patient will have the transition of care call conducted by the primary care provider.   This patient had previously been contacted to  enrolled [facility] Embedded practice which has a chronic disease management Embedded Care Management team.  Plan: Notification sent to the [facility] Embedded Care Management team for referral for post hospital Chronic care needs and made aware of above needs.   Please contact for further questions,  Charlesetta Shanks, RN BSN CCM [facility]  [phone] business mobile phone Toll free office [phone]  Fax number: [phone] [email or web address]

## 2020-06-06 NOTE — Discharge Instructions (Signed)
Inpatient Rehab Discharge Instructions  Sheila Ray Discharge date and time: No discharge date for patient encounter.   Activities/Precautions/ Functional Status: Activity: activity as tolerated Diet: Diabetic diet Wound Care: Routine skin checks Functional status:  ___ No restrictions     ___ Walk up steps independently ___ 24/7 supervision/assistance   ___ Walk up steps with assistance ___ Intermittent supervision/assistance  ___ Bathe/dress independently ___ Walk with walker     _x__ Bathe/dress with assistance ___ Walk Independently    ___ Shower independently ___ Walk with assistance    ___ Shower with assistance ___ No alcohol     ___ Return to work/school ________ COMMUNITY REFERRALS UPON DISCHARGE:    Home Health:   PT     OT     ST    RN                    Agency: [place]  Phone: [phone]  Medical Equipment/Items Ordered: Wheelchair, Conservation officer, nature, Bedside Commode, Hospital Bed                                                 Agency/Supplier: [employer]   Special Instructions: No driving smoking or alcohol  Routine Foley catheter care  STROKE/TIA DISCHARGE INSTRUCTIONS SMOKING Cigarette smoking nearly doubles your risk of having a stroke & is the single most alterable risk factor  If you smoke or have smoked in the last 12 months, you are advised to quit smoking for your health.  Most of the excess cardiovascular risk related to smoking disappears within a year of stopping.  Ask you doctor about anti-smoking medications  [place] Quit Line: 1-800-QUIT NOW  Free Smoking Cessation Classes [phone]  CHOLESTEROL Know your levels; limit fat & cholesterol in your diet  Lipid Panel     Component Value Date/Time   CHOL 173 05/04/2020 0317   CHOL 226 (H) 09/16/2019 1136   CHOL 173 01/06/2013 1354   TRIG 65 05/04/2020 0317   TRIG 120 05/06/2013 1341   TRIG 95 01/06/2013 1354   HDL 36 (L) 05/04/2020 0317   HDL 43 09/16/2019 1136    HDL 47 05/06/2013 1341   HDL 41 01/06/2013 1354   CHOLHDL 4.8 05/04/2020 0317   VLDL 13 05/04/2020 0317   LDLCALC 124 (H) 05/04/2020 0317   LDLCALC 158 (H) 09/16/2019 1136   LDLCALC 109 (H) 05/06/2013 1341   LDLCALC 113 (H) 01/06/2013 1354      Many patients benefit from treatment even if their cholesterol is at goal.  Goal: Total Cholesterol (CHOL) less than 160  Goal:  Triglycerides (TRIG) less than 150  Goal:  HDL greater than 40  Goal:  LDL (LDLCALC) less than 100   BLOOD PRESSURE American Stroke Association blood pressure target is less that 120/80 mm/Hg  Your discharge blood pressure is:  BP: 122/60  Monitor your blood pressure  Limit your salt and alcohol intake  Many individuals will require more than one medication for high blood pressure  DIABETES (A1c is a blood sugar average for last 3 months) Goal HGBA1c is under 7% (HBGA1c is blood sugar average for last 3 months)  Diabetes:     Lab Results  Component Value Date   HGBA1C 6.4 (H) 05/04/2020     Your HGBA1c can be lowered with medications, healthy  diet, and exercise.  Check your blood sugar as directed by your physician  Call your physician if you experience unexplained or   Eating Plan After Stroke A stroke causes damage to the brain cells, which can affect your ability to walk, talk, and even eat. The impact of a stroke is different for everyone, and so is recovery. A good nutrition plan is important for your recovery. It can also lower your risk of another stroke. If you have difficulty chewing and swallowing your food, a dietitian or your stroke care team can help so that you can enjoy eating healthy foods. What are tips for following this plan?  Reading food labels Choose foods that have less than 300 milligrams (mg) of sodium per serving. Limit your sodium intake to less than 1,500 mg per day. Avoid foods that have saturated fat and trans fat. Choose foods that are low in cholesterol. Limit the  amount of cholesterol you eat each day to less than 200 mg. Choose foods that are high in fiber. Eat 20-30 grams (g) of fiber each day. Avoid foods with added sugar. Check the food label for ingredients such as sugar, corn syrup, honey, fructose, molasses, and cane juice. Shopping At the grocery store, buy most of your food from areas near the walls of the store. This includes: Fresh fruits and vegetables. Dry grains, beans, nuts, and seeds. Fresh seafood, poultry, lean meats, and eggs. Low-fat dairy products. Buy whole ingredients instead of prepackaged foods. Buy fresh, in-season fruits and vegetables from local farmers markets. Buy frozen fruits and vegetables in resealable bags. Cooking Prepare foods with very little salt. Use herbs or salt-free spices instead. Cook with heart-healthy oils, such as olive, avocado, canola, soybean, or sunflower oil. Avoid frying foods. Bake, grill, or broil foods instead. Remove visible fat and skin from meat and poultry before eating. Modify food textures as told by your health care provider. Meal planning Eat a wide variety of colorful fruits and vegetables. Make sure one-half of your plate is filled with fruits and vegetables at each meal. Eat fruits and vegetables that are high in potassium, such as: Apples, bananas, oranges, and melon. Sweet potatoes, spinach, zucchini, and tomatoes. Eat fish that contain heart-healthy fats (omega-3 fats) at least twice a week. These include salmon, tuna, mackerel, and sardines. Eat plant foods that are high in omega-3 fats, such as flaxseeds and walnuts. Add these to cereals, yogurt, or pasta dishes. Eat several servings of high-fiber foods each day, such as fruits, vegetables, whole grains, and beans. Do not put salt at the table for meals. When eating out at restaurants: Ask the server about low-salt or salt-free food options. Avoid fried foods. Look for menu items that are grilled, steamed, broiled, or  roasted. Ask if your food can be prepared without butter. Ask for condiments, such as salad dressings, gravy, or sauces to be served on the side. If you have difficulty swallowing: Choose foods that are softer and easier to chew and swallow. Cut foods into small pieces and chew well before swallowing. Thicken liquids as told by your health care provider or dietitian. Let your health care provider know if your condition does not improve over time. You may need to work with a speech therapist to re-train the muscles that are used for eating. General recommendations Involve your family and friends in your recovery, if possible. It may be helpful to have a slower meal time and to plan meals that include foods everyone in the family can  eat. Brush your teeth with fluoride toothpaste twice a day, and floss once a day. Keeping a clean mouth can help you swallow and can also help your appetite. Drink enough water each day to keep your urine pale yellow. If needed, set reminders or ask your family to help you remember to drink water. Limit alcohol intake to no more than 1 drink a day for nonpregnant women and 2 drinks a day for men. One drink equals 12 oz of beer, 5 oz of wine, or 1 oz of hard liquor. Summary Following this eating plan can help in your stroke recovery and can decrease your risk for another stroke. Let your health care provider know if you have problems with swallowing. You may need to work with a speech therapist. This information is not intended to replace advice given to you by your health care provider. Make sure you discuss any questions you have with your health care provider. Document Revised: 10/08/2018 Document Reviewed: 08/25/2017 Elsevier Patient Education  [place].  low blood sugars.  PHYSICAL ACTIVITY/REHABILITATION Goal is 30 minutes at least 4 days per week  Activity: Increase activity slowly, Therapies: Physical Therapy: Home Health Return to work:    Activity decreases your risk of heart attack and stroke and makes your heart stronger.  It helps control your weight and blood pressure; helps you relax and can improve your mood.  Participate in a regular exercise program.  Talk with your doctor about the best form of exercise for you (dancing, walking, swimming, cycling).  DIET/WEIGHT Goal is to maintain a healthy weight  Your discharge diet is:  Diet Order            Diet regular Room service appropriate? No; Fluid consistency: Thin  Diet effective now                 liquids Your height is:    Your current weight is:   Your Body Mass Index (BMI) is:     Following the type of diet specifically designed for you will help prevent another stroke.  Your goal weight range is:    Your goal Body Mass Index (BMI) is 19-24.  Healthy food habits can help reduce 3 risk factors for stroke:  High cholesterol, hypertension, and excess weight.  RESOURCES Stroke/Support Group:  Call [phone]   STROKE EDUCATION PROVIDED/REVIEWED AND GIVEN TO PATIENT Stroke warning signs and symptoms How to activate emergency medical system (call 911). Medications prescribed at discharge. Need for follow-up after discharge. Personal risk factors for stroke. Pneumonia vaccine given:  Flu vaccine given:  My questions have been answered, the writing is legible, and I understand these instructions.  I will adhere to these goals & educational materials that have been provided to me after my discharge from the hospital.      My questions have been answered and I understand these instructions. I will adhere to these goals and the provided educational materials after my discharge from the hospital.  Patient/Caregiver Signature _______________________________ Date __________  Clinician Signature _______________________________________ Date __________  Please bring this form and your medication list with you to all your follow-up doctor's appointments.    Information on my medicine - ELIQUIS (apixaban)  This medication education was reviewed with me or my healthcare representative as part of my discharge preparation.  The pharmacist that spoke with me during my hospital stay was:  Onnie Boer, RPH-CPP  Why was Eliquis prescribed for you? Eliquis was prescribed for you to reduce the  risk of a blood clot forming that can cause a stroke if you have a medical condition called atrial fibrillation (a type of irregular heartbeat).  What do You need to know about Eliquis ? Take your Eliquis TWICE DAILY - one tablet in the morning and one tablet in the evening with or without food. If you have difficulty swallowing the tablet whole please discuss with your pharmacist how to take the medication safely.  Take Eliquis exactly as prescribed by your doctor and DO NOT stop taking Eliquis without talking to the doctor who prescribed the medication.  Stopping may increase your risk of developing a stroke.  Refill your prescription before you run out.  After discharge, you should have regular check-up appointments with your healthcare provider that is prescribing your Eliquis.  In the future your dose may need to be changed if your kidney function or weight changes by a significant amount or as you get older.  What do you do if you miss a dose? If you miss a dose, take it as soon as you remember on the same day and resume taking twice daily.  Do not take more than one dose of ELIQUIS at the same time to make up a missed dose.  Important Safety Information A possible side effect of Eliquis is bleeding. You should call your healthcare provider right away if you experience any of the following: ? Bleeding from an injury or your nose that does not stop. ? Unusual colored urine (red or dark brown) or unusual colored stools (red or black). ? Unusual bruising for unknown reasons. ? A serious fall or if you hit your head (even if there is no bleeding).  Some  medicines may interact with Eliquis and might increase your risk of bleeding or clotting while on Eliquis. To help avoid this, consult your healthcare provider or pharmacist prior to using any new prescription or non-prescription medications, including herbals, vitamins, non-steroidal anti-inflammatory drugs (NSAIDs) and supplements.  This website has more information on Eliquis (apixaban): http://[email or web address].com/eliquis/home

## 2020-06-06 NOTE — Progress Notes (Signed)
Inpatient Rehabilitation Care Coordinator  Discharge Note  The overall goal for the admission was met for:   Discharge location: Yes, Home  Length of Stay: Yes, 22 Days  Discharge activity level: Yes, ambulatory level Min Assist  Home/community participation: Yes  Services provided included: MD, RD, PT, OT, SLP, RN, CM, TR, Pharmacy, Neuropsych and SW  Financial Services: Private Insurance: [employer]  Follow-up services arranged: [facility]  Comments (or additional information): Hospital Bed, Wheelchair, Rolling Walker, Bedside Commode  Patient/Family verbalized understanding of follow-up arrangements: Yes  Individual responsible for coordination of the follow-up plan: Elta Guadeloupe [phone]  Confirmed correct DME delivered: Dyanne Iha 06/06/2020    Dyanne Iha

## 2020-06-07 ENCOUNTER — Other Ambulatory Visit: Payer: Self-pay | Admitting: *Deleted

## 2020-06-07 ENCOUNTER — Telehealth: Payer: Self-pay | Admitting: *Deleted

## 2020-06-07 DIAGNOSIS — N319 Neuromuscular dysfunction of bladder, unspecified: Secondary | ICD-10-CM

## 2020-06-07 DIAGNOSIS — I639 Cerebral infarction, unspecified: Secondary | ICD-10-CM

## 2020-06-07 DIAGNOSIS — E1169 Type 2 diabetes mellitus with other specified complication: Secondary | ICD-10-CM

## 2020-06-07 MED ORDER — ONETOUCH ULTRASOFT LANCETS MISC: 12 refills | Status: DC

## 2020-06-07 NOTE — Telephone Encounter (Signed)
Transition Care Management Unsuccessful Follow-up Telephone Call  Date of discharge and from where:  06/07/2020  Attempts:  1st Attempt  Reason for unsuccessful TCM follow-up call:  No answer/busy

## 2020-06-07 NOTE — Telephone Encounter (Signed)
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 Same with right side weakness  Any questions or concerns? Yes   Items Reviewed:  Did the pt receive and understand the discharge instructions provided? Yes   Medications obtained and verified? Yes   Any new allergies since your discharge? No   Dietary orders reviewed? Yes  Do you have support at home? Yes   Discontinued Medications Xarelto New Medications Added Eliquis Levaquin  Current Medication List Allergies as of 06/07/2020      Reactions   Crestor [rosuvastatin] Other (See Comments)   Cramps   Lipitor [atorvastatin] Other (See Comments)   Cramps   Pravastatin Other (See Comments)   Cramps    Keflex [cephalexin] Hives      Medication List       Accurate as of June 07, 2020  4:29 PM. If you have any questions, ask your nurse or doctor.        acetaminophen 325 MG tablet Commonly known as: TYLENOL Take 2 tablets (650 mg total) by mouth every 4 (four) hours as needed for mild pain (or temp > 37.5 C (99.5 F)).   alendronate 70 MG tablet Commonly known as: FOSAMAX TAKE 1 TABLET BY MOUTH  WEEKLY AS DIRECTED What changed: See the new instructions.   apixaban 5 MG Tabs tablet Commonly known as: ELIQUIS Take 1 tablet (5 mg total) by mouth 2 (two) times daily.   diltiazem 240 MG 24 hr capsule Commonly known as: CARDIZEM CD Take 1 capsule (240 mg total) by mouth daily.   ezetimibe 10 MG tablet Commonly known as: ZETIA Take 1 tablet (10 mg total) by mouth daily.   ipratropium 0.06 % nasal spray Commonly known as: ATROVENT Place 1 spray into both nostrils 2 (two) times daily.   levofloxacin 250 MG tablet Commonly known as: LEVAQUIN Take 1 tablet (250 mg total) by  mouth daily.   magnesium oxide 400 (241.3 Mg) MG tablet Commonly known as: MAG-OX Take 1 tablet (400 mg total) by mouth daily.   melatonin 3 MG Tabs tablet Take 1 tablet (3 mg total) by mouth at bedtime.   metFORMIN 1000 MG tablet Commonly known as: GLUCOPHAGE TAKE ONE-HALF TABLET BY  MOUTH DAILY WITH BREAKFAST   metoprolol succinate 100 MG 24 hr tablet Commonly known as: TOPROL-XL Take 1 tablet (100 mg total) by mouth daily. Take with or immediately following a meal.   saccharomyces boulardii 250 MG capsule Commonly known as: FLORASTOR Take 1 capsule (250 mg total) by mouth 2 (two) times daily.   senna-docusate 8.6-50 MG tablet Commonly known as: Senokot-S Take 2 tablets by mouth 2 (two) times daily.   Vitamin B12 1000 MCG Tbcr Take 1,000 mcg by mouth daily.        Home Care and Equipment/Supplies: Were home health services ordered? yes If so, what is the name of the agency? [facility]  Has the agency set up a time to come to the patient's home? no Were any new equipment or medical supplies ordered?  No What is the name of the medical supply agency?  Were you able to get the supplies/equipment? not applicable Do you have any questions related to the use of the equipment or supplies? No  Functional Questionnaire: (I = Independent and D =  Dependent) ADLs: D   Bathing/Dressing- D  Meal Prep- D  Eating-D  Maintaining continence- D  Transferring/Ambulation- D  Managing Meds- D  Follow up appointments reviewed:    PCP Hospital f/u appt confirmed? Yes  Scheduled to see [name], FNP on Friday 06/16/2020  2:40PM.  [facility] f/u appt confirmed? No  Scheduled to see  Are transportation arrangements needed? No   If their condition worsens, is the pt aware to call PCP or go to the Emergency Dept.? Yes  Was the patient provided with contact information for the PCP's office or ED? Yes  Was to pt encouraged to call back with questions or  concerns? Yes

## 2020-06-07 NOTE — Plan of Care (Signed)
Foley inserted for discharge due to urinary retention; son unable to complete I+O caths

## 2020-06-07 NOTE — Telephone Encounter (Signed)
06/07/2020  Message received from Natividad Brood, RN BSN CCM, [facility] Liaison yesterday regarding discharge from SNF. Message is below.   This patient was in inpatient rehab for 22 days and SNF was recomm for ongoing therapy however, insurance denied. She went home today with [facility]. She had a LTC bed at [facility] for custodial care but the family declined and wanted more rehab/PT per Rehab notes. Looks like attempt were made earlier part of this year for your CCM program. Family education was done today with son and daughter in law for home care needs.   We made three unsuccessful attempts earlier in the year to enroll patient in CCM services. She is in need of a TOC call and appt and nurse or provider should recommend embedded CCM services if appropriate.   Forwarding to [facility] Clinical staff for review and contact for TOC.  Chong Sicilian, BSN, RN-BC Embedded Chronic Care Manager [facility] Direct Dial: [phone]

## 2020-06-09 DIAGNOSIS — E1165 Type 2 diabetes mellitus with hyperglycemia: Secondary | ICD-10-CM | POA: Diagnosis not present

## 2020-06-09 DIAGNOSIS — I4891 Unspecified atrial fibrillation: Secondary | ICD-10-CM | POA: Diagnosis not present

## 2020-06-09 DIAGNOSIS — I69351 Hemiplegia and hemiparesis following cerebral infarction affecting right dominant side: Secondary | ICD-10-CM | POA: Diagnosis not present

## 2020-06-09 DIAGNOSIS — I69320 Aphasia following cerebral infarction: Secondary | ICD-10-CM | POA: Diagnosis not present

## 2020-06-09 DIAGNOSIS — N390 Urinary tract infection, site not specified: Secondary | ICD-10-CM | POA: Diagnosis not present

## 2020-06-12 DIAGNOSIS — I69320 Aphasia following cerebral infarction: Secondary | ICD-10-CM | POA: Diagnosis not present

## 2020-06-12 DIAGNOSIS — I69351 Hemiplegia and hemiparesis following cerebral infarction affecting right dominant side: Secondary | ICD-10-CM | POA: Diagnosis not present

## 2020-06-12 DIAGNOSIS — I4891 Unspecified atrial fibrillation: Secondary | ICD-10-CM | POA: Diagnosis not present

## 2020-06-12 DIAGNOSIS — E1165 Type 2 diabetes mellitus with hyperglycemia: Secondary | ICD-10-CM | POA: Diagnosis not present

## 2020-06-12 DIAGNOSIS — N390 Urinary tract infection, site not specified: Secondary | ICD-10-CM | POA: Diagnosis not present

## 2020-06-13 DIAGNOSIS — N390 Urinary tract infection, site not specified: Secondary | ICD-10-CM | POA: Diagnosis not present

## 2020-06-13 DIAGNOSIS — I69320 Aphasia following cerebral infarction: Secondary | ICD-10-CM | POA: Diagnosis not present

## 2020-06-13 DIAGNOSIS — I69351 Hemiplegia and hemiparesis following cerebral infarction affecting right dominant side: Secondary | ICD-10-CM | POA: Diagnosis not present

## 2020-06-13 DIAGNOSIS — E1165 Type 2 diabetes mellitus with hyperglycemia: Secondary | ICD-10-CM | POA: Diagnosis not present

## 2020-06-13 DIAGNOSIS — I4891 Unspecified atrial fibrillation: Secondary | ICD-10-CM | POA: Diagnosis not present

## 2020-06-14 ENCOUNTER — Emergency Department (HOSPITAL_COMMUNITY)
Admission: EM | Admit: 2020-06-14 | Discharge: 2020-06-15 | Disposition: A | Discharge status: Home / Self Care | Payer: Medicare Other | Attending: Emergency Medicine | Admitting: Emergency Medicine

## 2020-06-14 ENCOUNTER — Ambulatory Visit (INDEPENDENT_AMBULATORY_CARE_PROVIDER_SITE_OTHER): Payer: Medicare Other

## 2020-06-14 ENCOUNTER — Other Ambulatory Visit: Payer: Self-pay

## 2020-06-14 ENCOUNTER — Encounter: Payer: Medicare Other | Attending: Registered Nurse | Admitting: Registered Nurse

## 2020-06-14 ENCOUNTER — Encounter (HOSPITAL_COMMUNITY): Payer: Self-pay | Admitting: *Deleted

## 2020-06-14 DIAGNOSIS — I4891 Unspecified atrial fibrillation: Secondary | ICD-10-CM | POA: Diagnosis not present

## 2020-06-14 DIAGNOSIS — Z7901 Long term (current) use of anticoagulants: Secondary | ICD-10-CM | POA: Diagnosis not present

## 2020-06-14 DIAGNOSIS — E785 Hyperlipidemia, unspecified: Secondary | ICD-10-CM | POA: Diagnosis not present

## 2020-06-14 DIAGNOSIS — I110 Hypertensive heart disease with heart failure: Secondary | ICD-10-CM | POA: Diagnosis not present

## 2020-06-14 DIAGNOSIS — N3001 Acute cystitis with hematuria: Secondary | ICD-10-CM | POA: Insufficient documentation

## 2020-06-14 DIAGNOSIS — E11649 Type 2 diabetes mellitus with hypoglycemia without coma: Secondary | ICD-10-CM | POA: Insufficient documentation

## 2020-06-14 DIAGNOSIS — Z794 Long term (current) use of insulin: Secondary | ICD-10-CM | POA: Diagnosis not present

## 2020-06-14 DIAGNOSIS — E039 Hypothyroidism, unspecified: Secondary | ICD-10-CM | POA: Insufficient documentation

## 2020-06-14 DIAGNOSIS — I69351 Hemiplegia and hemiparesis following cerebral infarction affecting right dominant side: Secondary | ICD-10-CM | POA: Diagnosis not present

## 2020-06-14 DIAGNOSIS — T83091A Other mechanical complication of indwelling urethral catheter, initial encounter: Secondary | ICD-10-CM | POA: Insufficient documentation

## 2020-06-14 DIAGNOSIS — N390 Urinary tract infection, site not specified: Secondary | ICD-10-CM | POA: Diagnosis not present

## 2020-06-14 DIAGNOSIS — I5030 Unspecified diastolic (congestive) heart failure: Secondary | ICD-10-CM | POA: Insufficient documentation

## 2020-06-14 DIAGNOSIS — Z8673 Personal history of transient ischemic attack (TIA), and cerebral infarction without residual deficits: Secondary | ICD-10-CM | POA: Diagnosis not present

## 2020-06-14 DIAGNOSIS — Z79899 Other long term (current) drug therapy: Secondary | ICD-10-CM | POA: Insufficient documentation

## 2020-06-14 DIAGNOSIS — E1165 Type 2 diabetes mellitus with hyperglycemia: Secondary | ICD-10-CM | POA: Diagnosis not present

## 2020-06-14 DIAGNOSIS — I69320 Aphasia following cerebral infarction: Secondary | ICD-10-CM | POA: Diagnosis not present

## 2020-06-14 DIAGNOSIS — Z96653 Presence of artificial knee joint, bilateral: Secondary | ICD-10-CM | POA: Diagnosis not present

## 2020-06-14 DIAGNOSIS — R109 Unspecified abdominal pain: Secondary | ICD-10-CM | POA: Diagnosis not present

## 2020-06-14 LAB — COMPREHENSIVE METABOLIC PANEL
ALT: 14 U/L (ref 0–44)
AST: 20 U/L (ref 15–41)
Albumin: 2.8 g/dL — ABNORMAL LOW (ref 3.5–5.0)
Alkaline Phosphatase: 82 U/L (ref 38–126)
Anion gap: 8 (ref 5–15)
BUN: 12 mg/dL (ref 8–23)
CO2: 23 mmol/L (ref 22–32)
Calcium: 9.2 mg/dL (ref 8.9–10.3)
Chloride: 102 mmol/L (ref 98–111)
Creatinine, Ser: 0.67 mg/dL (ref 0.44–1.00)
GFR, Estimated: >60 mL/min (ref >60)
Glucose, Bld: 122 mg/dL — ABNORMAL HIGH (ref 70–99)
Potassium: 3.7 mmol/L (ref 3.5–5.1)
Sodium: 133 mmol/L — ABNORMAL LOW (ref 135–145)
Total Bilirubin: 0.9 mg/dL (ref 0.3–1.2)
Total Protein: 5.6 g/dL — ABNORMAL LOW (ref 6.5–8.1)

## 2020-06-14 LAB — CBC
HCT: 37.6 % (ref 36.0–46.0)
Hemoglobin: 11.3 g/dL — ABNORMAL LOW (ref 12.0–15.0)
MCH: 22.9 pg — ABNORMAL LOW (ref 26.0–34.0)
MCHC: 30.1 g/dL (ref 30.0–36.0)
MCV: 76.3 fL — ABNORMAL LOW (ref 80.0–100.0)
Platelets: 408 10*3/uL — ABNORMAL HIGH (ref 150–400)
RBC: 4.93 MIL/uL (ref 3.87–5.11)
RDW: 17.4 % — ABNORMAL HIGH (ref 11.5–15.5)
WBC: 9.6 10*3/uL (ref 4.0–10.5)
nRBC: 0 % (ref 0.0–0.2)

## 2020-06-14 LAB — LIPASE, BLOOD: Lipase: 26 U/L (ref 11–51)

## 2020-06-14 NOTE — ED Provider Notes (Signed)
[facility] EMERGENCY DEPARTMENT Provider Note   CSN: [id number] Arrival date & time: 06/14/20  1840     History Chief Complaint  Patient presents with  . unable to void catheter clogged  . Abdominal Pain    Sheila Ray is a 79 y.o. female.  HPI   79 year old female history of arthritis, atrial fibrillation, diabetes, diverticulosis, hyperlipidemia, hypertension, hypothyroidism, rosacea, CVA, who presents to the emergency department today for evaluation of a Foley catheter problem.  Her son is at bedside and assists with the history.  He states that patient was discharged from rehab recently with a Foley catheter.  They have been monitoring her urine output and today he noted that she has had less urine output than normal and is concerned that she may have a clog in her Foley catheter.  He asked her if she had abdominal pain which she initially denied however later on in the day she started complaining of some lower abdominal pain.  She has had no vomiting at home.  She has had intermittent constipation and is on a stool softener for that.  She has had no fevers.  Triage note indicated the patient has been more confused and it was also documented that she had chest pain.  I asked the son about this and he states actually since she has been home she is improved significantly.  She does seem to get more confused when she is tired later on the midday but this is not any worse than since she initially came home.  He also denies that she has been complaining of any chest pain.  Past Medical History:  Diagnosis Date  . Arthritis   . Atrial fibrillation ([place])   . Complication of anesthesia   . Diabetes mellitus without complication ([place])   . Diverticulosis   . Fatigue   . Gastric polyp   . Goiter   . Hyperlipidemia   . Hypertension   . Hypothyroid    taken off of thyroid medication 2012014   . Obese   . Osteopenia   . PONV (postoperative nausea and vomiting)    . Postmenopausal   . Rosacea   . Stroke ([place]) 01/15/2013   left thalamic  stroke, small vessel  . Vitamin D deficiency     Patient Active Problem List   Diagnosis Date Noted  . Hyperglycemia   . Chronic diastolic congestive heart failure ([place])   . History of hypertension   . Neurogenic bladder   . Acute lower UTI   . Essential hypertension   . Hyponatremia   . Slow transit constipation   . Hypokalemia   . Controlled type 2 diabetes mellitus with hyperglycemia, without long-term current use of insulin ([place])   . Left middle cerebral artery stroke ([place]) 05/15/2020  . Acute ischemic stroke ([place]) 05/03/2020  . 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 Hypoxia   . Encephalopathy acute   . Atrial fibrillation ([place]) 08/19/2016  . OA (osteoarthritis) of knee 07/25/2014  . Hypothyroid 03/04/2013  . Stroke ([place]) 01/15/2013  . Osteoporosis 12/02/2012  . DM (diabetes mellitus) ([place]) 10/22/2012  . Hypertension associated with diabetes ([place]) 10/22/2012  . Hyperlipidemia associated with type 2 diabetes mellitus ([place]) 10/22/2012  . Vitamin D deficiency 09/13/2012  . Obesity (BMI 30-39.9) 09/13/2012    Past Surgical History:  Procedure Laterality Date  . ABDOMINAL HYSTERECTOMY  1985  . FOOT SURGERY Right 08/2002  . IR CT HEAD LTD  05/03/2020  . IR  PERCUTANEOUS ART THROMBECTOMY/INFUSION INTRACRANIAL INC DIAG ANGIO  05/03/2020  . RADIOLOGY WITH ANESTHESIA N/A 05/03/2020   Procedure: IR WITH ANESTHESIA;  Surgeon: Radiologist, Medication, MD;  Location: [facility];  Service: Radiology;  Laterality: N/A;  . right knee arthroscopy   2013   . TONSILLECTOMY    . 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   Procedure: RIGHT TOTAL KNEE ARTHROPLASTY;  Surgeon: Gearlean Alf, MD;  Location: [facility];  Service: Orthopedics;  Laterality: Right;     OB History   No obstetric history on file.     Family History  Problem Relation Age of Onset  . Osteoporosis Mother   . Alzheimer's disease  Mother 22  . Arthritis Mother   . Cancer Father        Lung  . Arthritis Sister   . Obesity Sister   . Heart attack Brother 38  . Heart disease Son   . Arthritis Brother   . Arthritis Sister   . Cancer Sister        breast cancer    Social History   Tobacco Use  . Smoking status: Never Smoker  . Smokeless tobacco: Never Used  Vaping Use  . Vaping Use: Never used  Substance Use Topics  . Alcohol use: No  . Drug use: No    Home Medications Prior to Admission medications   Medication Sig Start Date End Date Taking? Authorizing Provider  acetaminophen (TYLENOL) 325 MG tablet Take 2 tablets (650 mg total) by mouth every 4 (four) hours as needed for mild pain (or temp > 37.5 C (99.5 F)). 06/05/20   Angiulli, Lavon Paganini, PA-C  alendronate (FOSAMAX) 70 MG tablet TAKE 1 TABLET BY MOUTH  WEEKLY AS DIRECTED Patient taking differently: Take 70 mg by mouth once a week.  03/15/19   Sharion Balloon, FNP  apixaban (ELIQUIS) 5 MG TABS tablet Take 1 tablet (5 mg total) by mouth 2 (two) times daily. 06/05/20   Angiulli, Lavon Paganini, PA-C  ciprofloxacin (CIPRO) 500 MG tablet Take 1 tablet (500 mg total) by mouth every 12 (twelve) hours for 7 days. 06/15/20 06/22/20  Luismanuel Corman S, PA-C  Cyanocobalamin (VITAMIN B12) 1000 MCG TBCR Take 1,000 mcg by mouth daily. 06/05/20   Angiulli, Lavon Paganini, PA-C  diltiazem (CARDIZEM CD) 240 MG 24 hr capsule Take 1 capsule (240 mg total) by mouth daily. 06/05/20   Angiulli, Lavon Paganini, PA-C  ezetimibe (ZETIA) 10 MG tablet Take 1 tablet (10 mg total) by mouth daily. 06/05/20   Angiulli, Lavon Paganini, PA-C  ipratropium (ATROVENT) 0.06 % nasal spray Place 1 spray into both nostrils 2 (two) times daily. 06/05/20   Angiulli, Lavon Paganini, PA-C  Lancets [facility] ULTRASOFT) lancets Use as instructed 06/07/20   [name] A, FNP  magnesium oxide (MAG-OX) 400 (241.3 Mg) MG tablet Take 1 tablet (400 mg total) by mouth daily. 06/05/20   Angiulli, Lavon Paganini, PA-C  melatonin 3 MG TABS tablet  Take 1 tablet (3 mg total) by mouth at bedtime. 06/05/20   Angiulli, Lavon Paganini, PA-C  metFORMIN (GLUCOPHAGE) 1000 MG tablet TAKE ONE-HALF TABLET BY  MOUTH DAILY WITH BREAKFAST 06/05/20   Angiulli, Lavon Paganini, PA-C  metoprolol succinate (TOPROL-XL) 100 MG 24 hr tablet Take 1 tablet (100 mg total) by mouth daily. Take with or immediately following a meal. 06/05/20   Angiulli, Lavon Paganini, PA-C  saccharomyces boulardii (FLORASTOR) 250 MG capsule Take 1 capsule (250 mg total) by mouth 2 (two) times daily. 06/05/20  Angiulli, Lavon Paganini, PA-C  senna-docusate (SENOKOT-S) 8.6-50 MG tablet Take 2 tablets by mouth 2 (two) times daily. 06/05/20   Angiulli, Lavon Paganini, PA-C    Allergies    Crestor [rosuvastatin], Lipitor [atorvastatin], Pravastatin, and Keflex [cephalexin]  Review of Systems   Review of Systems  Constitutional: Negative for chills and fever.  HENT: Negative for ear pain and sore throat.   Eyes: Negative for visual disturbance.  Respiratory: Negative for cough and shortness of breath.   Cardiovascular: Negative for chest pain.  Gastrointestinal: Positive for abdominal pain. Negative for constipation, diarrhea, nausea and vomiting.  Genitourinary: Negative for dysuria and hematuria.  Musculoskeletal: Negative for back pain.  Skin: Negative for rash.  Neurological: Negative for headaches.  All other systems reviewed and are negative.   Physical Exam Updated Vital Signs BP (!) 110/57   Pulse 78   Temp 98 F (36.7 C) (Oral)   Resp (!) 22   Ht 5\' 4"  (1.626 m)   Wt 61.5 kg   SpO2 98%   BMI 23.27 kg/m   Physical Exam Vitals and nursing note reviewed.  Constitutional:      General: She is not in acute distress.    Appearance: She is well-developed and well-nourished.  HENT:     Head: Normocephalic and atraumatic.  Eyes:     Conjunctiva/sclera: Conjunctivae normal.  Cardiovascular:     Rate and Rhythm: Normal rate and regular rhythm.     Heart sounds: No murmur heard.   Pulmonary:      Effort: Pulmonary effort is normal. No respiratory distress.     Breath sounds: Normal breath sounds. No decreased breath sounds, wheezing, rhonchi or rales.  Abdominal:     Palpations: Abdomen is soft.     Tenderness: There is abdominal tenderness (llq). There is guarding. There is no rebound.  Musculoskeletal:        General: No edema.     Cervical back: Neck supple.  Skin:    General: Skin is warm and dry.  Neurological:     Mental Status: She is alert.  Psychiatric:        Mood and Affect: Mood and affect normal.     ED Results / Procedures / Treatments   Labs (all labs ordered are listed, but only abnormal results are displayed) Labs Reviewed  COMPREHENSIVE METABOLIC PANEL - Abnormal; Notable for the following components:      Result Value   Sodium 133 (*)    Glucose, Bld 122 (*)    Total Protein 5.6 (*)    Albumin 2.8 (*)    All other components within normal limits  CBC - Abnormal; Notable for the following components:   Hemoglobin 11.3 (*)    MCV 76.3 (*)    MCH 22.9 (*)    RDW 17.4 (*)    Platelets 408 (*)    All other components within normal limits  URINALYSIS, ROUTINE W REFLEX MICROSCOPIC - Abnormal; Notable for the following components:   APPearance HAZY (*)    Hgb urine dipstick MODERATE (*)    Leukocytes,Ua LARGE (*)    Bacteria, UA MANY (*)    All other components within normal limits  URINE CULTURE  LIPASE, BLOOD    EKG None  Radiology CT ABDOMEN PELVIS W CONTRAST  Result Date: 06/15/2020 CLINICAL DATA:  Lower abdominal pain EXAM: CT ABDOMEN AND PELVIS WITH CONTRAST TECHNIQUE: Multidetector CT imaging of the abdomen and pelvis was performed using the standard protocol following bolus administration of  intravenous contrast. CONTRAST:  122mL OMNIPAQUE IOHEXOL 300 MG/ML  SOLN COMPARISON:  None. FINDINGS: Lower chest: Small bilateral pleural effusions. Heart is mildly enlarged. Hepatobiliary: 2 cm low-density lesion in the left hepatic lobe,  likely cyst. Gallbladder unremarkable. Pancreas: No focal abnormality or ductal dilatation. Spleen: No focal abnormality.  Normal size. Adrenals/Urinary Tract: 14 mm nodule in the left adrenal gland. Right adrenal gland unremarkable. Small cyst off the lower pole of the left kidney. No hydronephrosis. Urinary bladder decompressed with Foley catheter in place. Stomach/Bowel: Stomach, large and small bowel grossly unremarkable. Vascular/Lymphatic: Aortic atherosclerosis. No evidence of aneurysm or adenopathy. Reproductive: Prior hysterectomy.  No adnexal masses. Other: No free fluid or free air. Musculoskeletal: No acute bony abnormality. IMPRESSION: Foley catheter in place. Urinary bladder decompressed. No hydronephrosis. Small bilateral pleural effusions. No acute findings in the abdomen or pelvis. Electronically Signed   By: Rolm Baptise M.D.   On: 06/15/2020 02:00    Procedures Procedures (including critical care time)  Medications Ordered in ED Medications  iohexol (OMNIPAQUE) 300 MG/ML solution 100 mL (100 mLs Intravenous Contrast Given 06/15/20 0144)  ciprofloxacin (CIPRO) tablet 500 mg (500 mg Oral Given 06/15/20 0236)    ED Course  I have reviewed the triage vital signs and the nursing notes.  Pertinent labs & imaging results that were available during my care of the patient were reviewed by me and considered in my medical decision making (see chart for details).    MDM Rules/Calculators/A&P                          79 year old female presenting emergency department today for evaluation of decreased urine output and concern for problem with Foley.  Reviewed/interpreted labs CBC showed no leukocytosis, anemia present but stable from prior CMP with mild hyponatremia, otherwise reassuring Lipase negative UA with hematuria, large leukocytes, 21-50 RBCs and WBCs, many bacteria, culture sent  -Consistent with UTI.  Given a dose of Cipro here in the ED after review of prior culture  reports.  CT abdomen/pelvis - Foley catheter in place. Urinary bladder decompressed. No hydronephrosis. Small bilateral pleural effusions. No acute findings in the abdomen or pelvis.  Foley catheter was irrigated and on reassessment it is draining normally.  Patient had about 1500 to 1600 cc of output.  Her pain improved following this.  Overall, work-up reveals UTI which was treated with antibiotics here in the ED.  Rx for Cipro given for home.  She has PCP appointment tomorrow.  Advised on follow-up and return precautions.  Patient and son voiced understanding of the plan reasons to return.  Questions answered.  Patient stable for discharge.  Pt seen in conjunction with Dr. Leonette Monarch who personally evaluated the patient and is in agreement with the plan.   Final Clinical Impression(s) / ED Diagnoses Final diagnoses:  Acute cystitis with hematuria    Rx / DC Orders ED Discharge Orders         Ordered    ciprofloxacin (CIPRO) 500 MG tablet  Every 12 hours        06/15/20 0233           Rodney Booze, PA-C 06/15/20 0238    Fatima Blank, MD 06/15/20 [phone]

## 2020-06-14 NOTE — ED Triage Notes (Signed)
The pt reports that her son brought her in  shes a little confused  She reports that she just had a baby.  Foley cath is visible    She reports that the vathter has not been draining since 1330 today  She is crying in pain in triage

## 2020-06-15 ENCOUNTER — Emergency Department (HOSPITAL_COMMUNITY): Payer: Medicare Other

## 2020-06-15 DIAGNOSIS — I4891 Unspecified atrial fibrillation: Secondary | ICD-10-CM | POA: Diagnosis not present

## 2020-06-15 DIAGNOSIS — E1165 Type 2 diabetes mellitus with hyperglycemia: Secondary | ICD-10-CM | POA: Diagnosis not present

## 2020-06-15 DIAGNOSIS — I69351 Hemiplegia and hemiparesis following cerebral infarction affecting right dominant side: Secondary | ICD-10-CM | POA: Diagnosis not present

## 2020-06-15 DIAGNOSIS — R109 Unspecified abdominal pain: Secondary | ICD-10-CM | POA: Diagnosis not present

## 2020-06-15 DIAGNOSIS — N390 Urinary tract infection, site not specified: Secondary | ICD-10-CM | POA: Diagnosis not present

## 2020-06-15 DIAGNOSIS — I69320 Aphasia following cerebral infarction: Secondary | ICD-10-CM | POA: Diagnosis not present

## 2020-06-15 LAB — URINALYSIS, ROUTINE W REFLEX MICROSCOPIC
Bilirubin Urine: NEGATIVE
Glucose, UA: NEGATIVE mg/dL
Ketones, ur: NEGATIVE mg/dL
Nitrite: NEGATIVE
Protein, ur: NEGATIVE mg/dL
Specific Gravity, Urine: 1.006 (ref 1.005–1.030)
pH: 5 (ref 5.0–8.0)

## 2020-06-15 IMAGING — CT CT ABD-PELV W/ CM
2 of 5 series · 16 of 46 positions shown, 18 images · IV contrast (omnipaque)
Comparison: None.

CLINICAL DATA: Lower abdominal pain

EXAM:
CT ABDOMEN AND PELVIS WITH CONTRAST
TECHNIQUE: Multidetector CT imaging of the abdomen and pelvis was performed
using the standard protocol following bolus administration of
intravenous contrast.
CONTRAST:  100mL OMNIPAQUE IOHEXOL 300 MG/ML  SOLN

[Series 3: a/p w/ 5mm · axial · 0.92mm/px · z∈[+339,+764]mm · 13 of 95 slices shown, 15 images]
[im 5/95  soft-tissue]
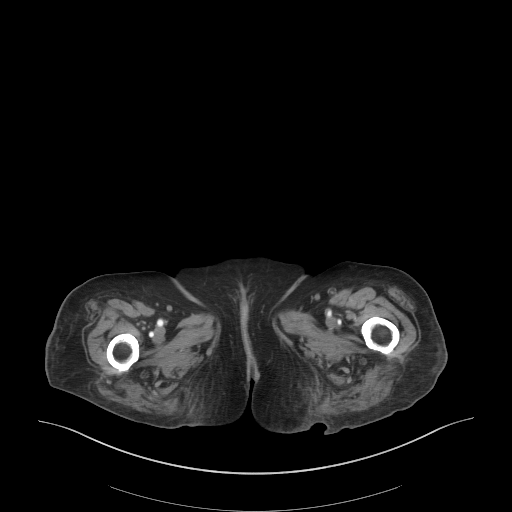
[im 5/95  bone]
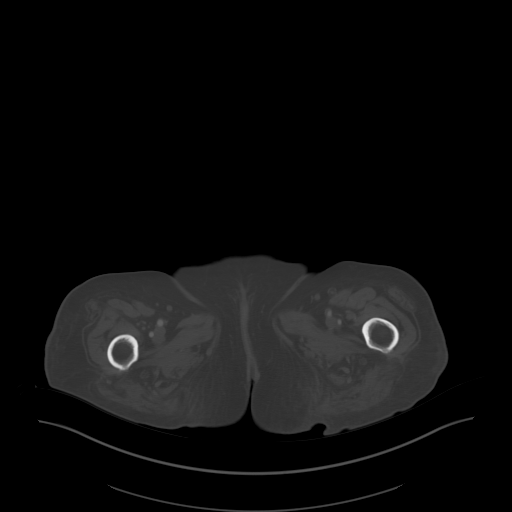
[im 14/95  soft-tissue]
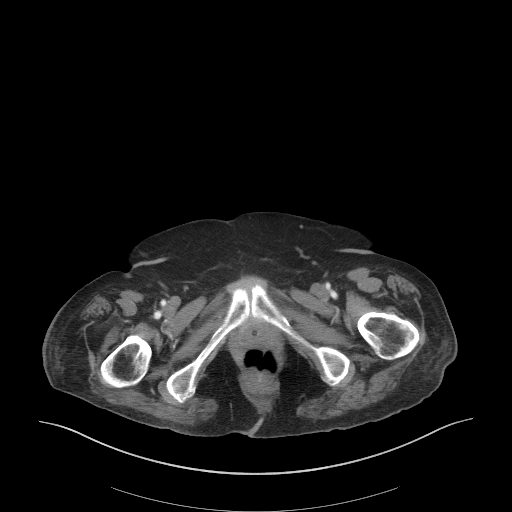
[im 18/95  soft-tissue]
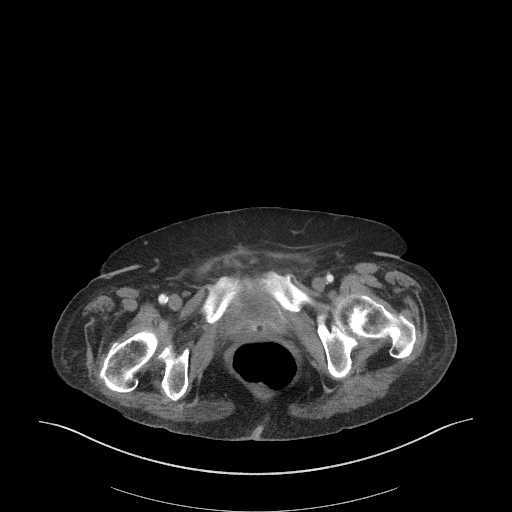
[im 27/95  soft-tissue]
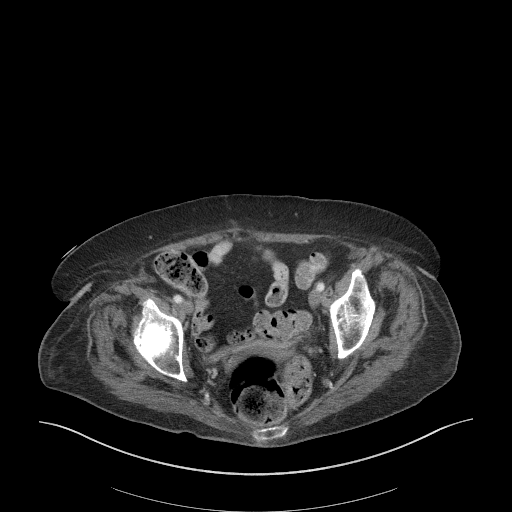
[im 32/95  soft-tissue]
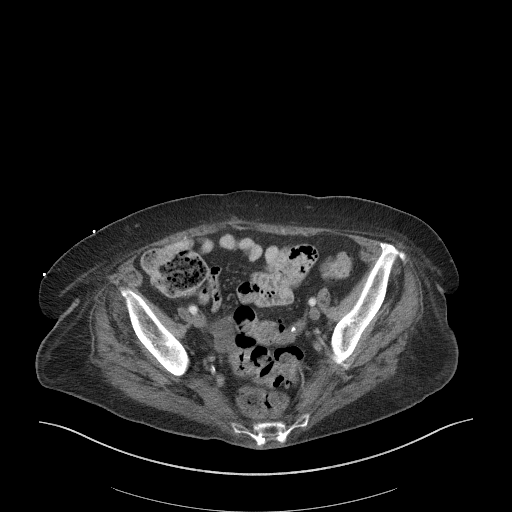
[im 41/95  soft-tissue]
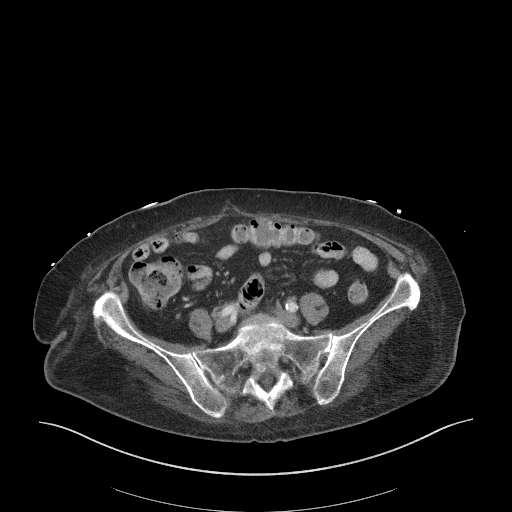
[im 50/95  soft-tissue]
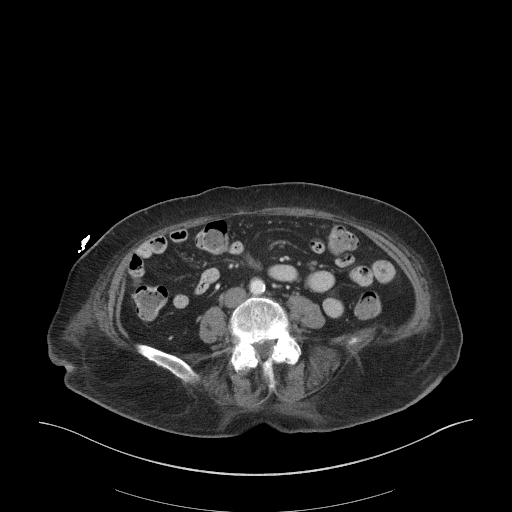
[im 54/95  soft-tissue]
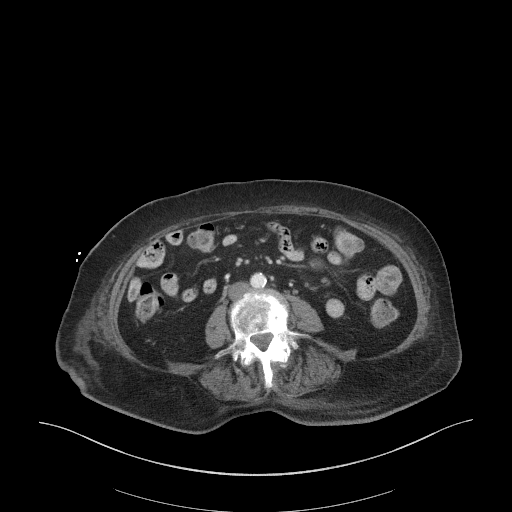
[im 63/95  soft-tissue]
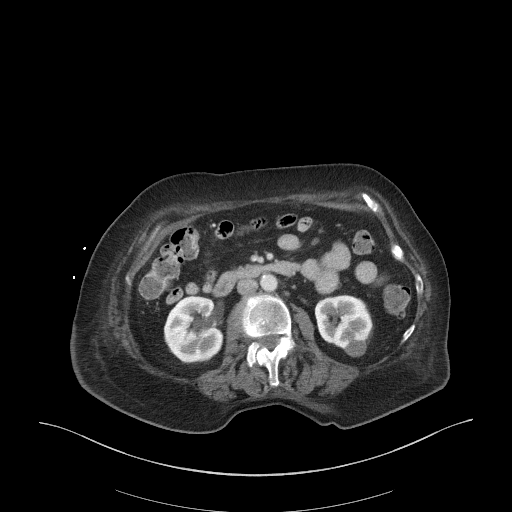
[im 63/95  bone]
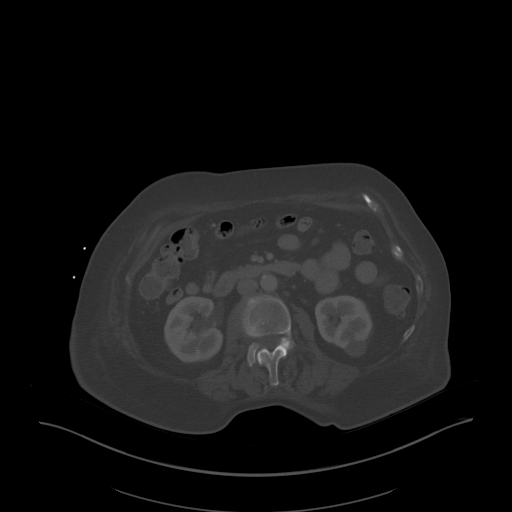
[im 68/95  soft-tissue]
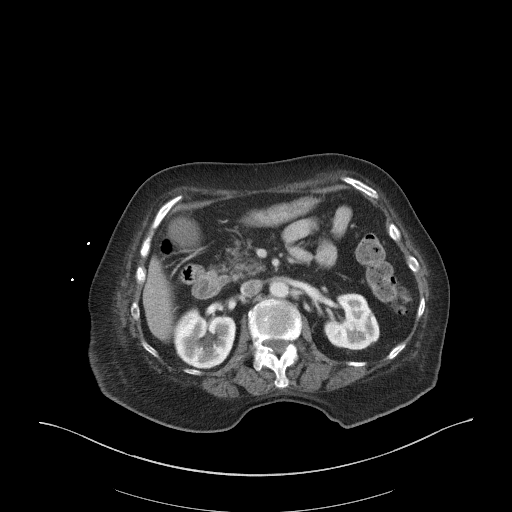
[im 77/95  soft-tissue]
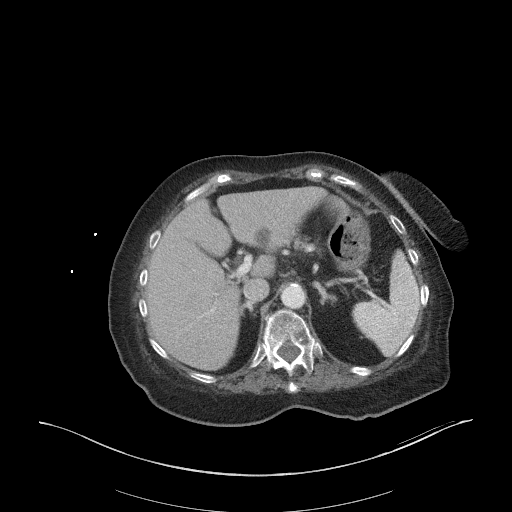
[im 81/95  soft-tissue]
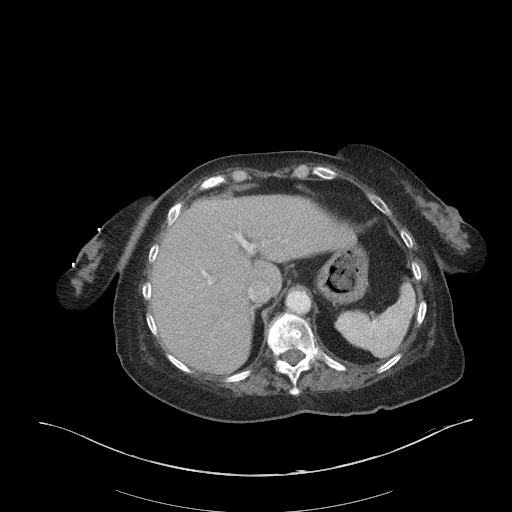
[im 90/95  soft-tissue]
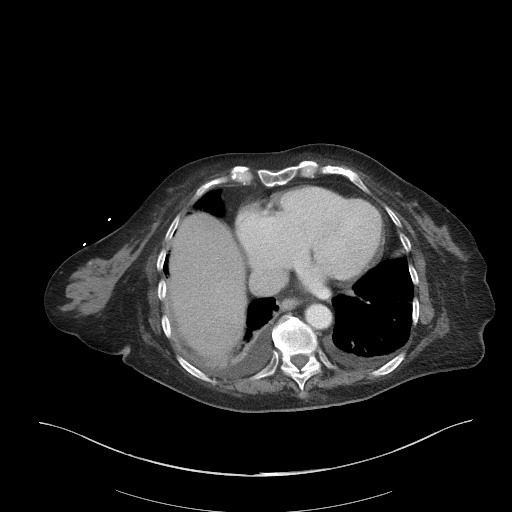

[Series 6: a/p w/ cor · coronal · 0.79mm/px · 3 of 133 slices shown]
[im 45/133  soft-tissue]
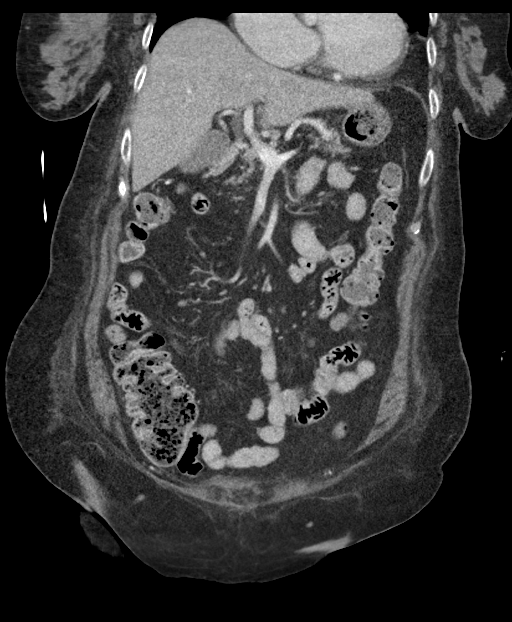
[im 59/133  soft-tissue]
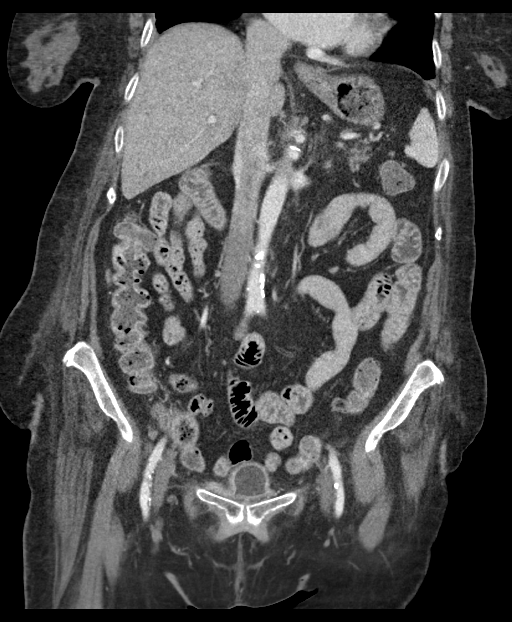
[im 74/133  soft-tissue]
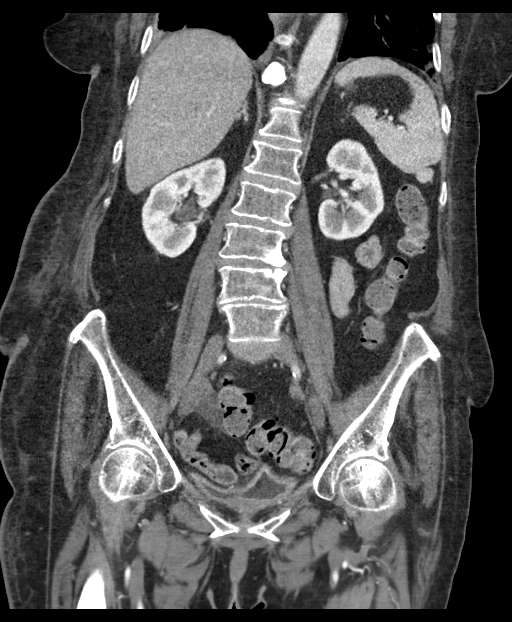

[16 of 46 positions shown; findings below may reference images not displayed]

FINDINGS: Lower chest: Small bilateral pleural effusions. Heart is mildly
enlarged.

Hepatobiliary: 2 cm low-density lesion in the left hepatic lobe,
likely cyst. Gallbladder unremarkable.

Pancreas: No focal abnormality or ductal dilatation.

Spleen: No focal abnormality.  Normal size.

Adrenals/Urinary Tract: 14 mm nodule in the left adrenal gland.
Right adrenal gland unremarkable. Small cyst off the lower pole of
the left kidney. No hydronephrosis. Urinary bladder decompressed
with Foley catheter in place.

Stomach/Bowel: Stomach, large and small bowel grossly unremarkable.

Vascular/Lymphatic: Aortic atherosclerosis. No evidence of aneurysm
or adenopathy.

Reproductive: Prior hysterectomy.  No adnexal masses.

Other: No free fluid or free air.

Musculoskeletal: No acute bony abnormality.
IMPRESSION: Foley catheter in place. Urinary bladder decompressed. No
hydronephrosis.

Small bilateral pleural effusions.

No acute findings in the abdomen or pelvis.

## 2020-06-15 MED ORDER — CIPROFLOXACIN HCL 500 MG PO TABS
500.0000 mg | ORAL_TABLET | Freq: Once | ORAL | Status: AC
  Administered 2020-06-15: 03:00:00 500 mg via ORAL
  Filled 2020-06-15: qty 1

## 2020-06-15 MED ORDER — IOHEXOL 300 MG/ML  SOLN
100.0000 mL | Freq: Once | INTRAMUSCULAR | Status: AC | PRN
  Administered 2020-06-15: 02:00:00 100 mL via INTRAVENOUS

## 2020-06-15 MED ORDER — CIPROFLOXACIN HCL 500 MG PO TABS: 500.0000 mg | ORAL_TABLET | Freq: Two times a day (BID) | ORAL | 0 refills | Status: AC

## 2020-06-15 NOTE — Discharge Instructions (Addendum)
You were given a prescription for antibiotics. Please take the antibiotic prescription fully.   A culture was sent of your urine today to determine if there is any bacterial growth. If the results of the culture are positive and you require an antibiotic or a change of your prescribed antibiotic you will be contacted by the hospital. If the results are negative you will not be contacted.  Please follow up with your primary care provider within 3-5 days for re-evaluation of your symptoms. If you do not have a primary care provider, information for a healthcare clinic has been provided for you to make arrangements for follow up care. Please return to the emergency department for any new or worsening symptoms.

## 2020-06-15 NOTE — ED Provider Notes (Signed)
Attestation: Medical screening examination/treatment/procedure(s) were conducted as a shared visit with non-physician practitioner(s) and myself.  I personally evaluated the patient during the encounter.   Briefly, the patient is a 79 y.o. female with h/o arthritis, atrial fibrillation, diabetes, diverticulosis, hyperlipidemia, hypertension, hypothyroidism, rosacea, CVA, who presents to the emergency department today for evaluation of a clogged Foley.  Vitals:   06/15/20 0115 06/15/20 0145  BP: 105/69 (!) 110/57  Pulse: 81 78  Resp: 17 (!) 22  Temp:    SpO2: 100% 98%    CONSTITUTIONAL:  well-appearing, NAD NEURO:  Alert and oriented x 3, no focal deficits EYES:  pupils equal and reactive ENT/NECK:  trachea midline, no JVD CARDIO:  reg rate, reg rhythm, well-perfused PULM:   None labored breathing GI/GU:  Abdomen non-distended, foley in place MSK/SPINE:  No gross deformities, no edema SKIN:  no rash, atraumatic PSYCH:  Appropriate speech and behavior   EKG Interpretation  Date/Time:    Ventricular Rate:    PR Interval:    QRS Duration:   QT Interval:    QTC Calculation:   R Axis:     Text Interpretation:         Work up notable for likely UTI.  Patient previously grew out Pseudomonas and Citrobacter both susceptible to Cipro. Will treat with Cipro.  Foley irrigated and flowing well.     Fatima Blank, MD 06/15/20 [phone]

## 2020-06-16 ENCOUNTER — Inpatient Hospital Stay: Payer: Medicare Other | Admitting: Family

## 2020-06-16 ENCOUNTER — Other Ambulatory Visit: Payer: Self-pay | Admitting: *Deleted

## 2020-06-16 DIAGNOSIS — I69351 Hemiplegia and hemiparesis following cerebral infarction affecting right dominant side: Secondary | ICD-10-CM | POA: Diagnosis not present

## 2020-06-16 DIAGNOSIS — I69320 Aphasia following cerebral infarction: Secondary | ICD-10-CM | POA: Diagnosis not present

## 2020-06-16 DIAGNOSIS — I4891 Unspecified atrial fibrillation: Secondary | ICD-10-CM | POA: Diagnosis not present

## 2020-06-16 DIAGNOSIS — R34 Anuria and oliguria: Secondary | ICD-10-CM

## 2020-06-16 DIAGNOSIS — E1165 Type 2 diabetes mellitus with hyperglycemia: Secondary | ICD-10-CM | POA: Diagnosis not present

## 2020-06-16 DIAGNOSIS — N390 Urinary tract infection, site not specified: Secondary | ICD-10-CM | POA: Diagnosis not present

## 2020-06-16 LAB — URINE CULTURE: Culture: 30000 — AB

## 2020-06-19 DIAGNOSIS — I4891 Unspecified atrial fibrillation: Secondary | ICD-10-CM | POA: Diagnosis not present

## 2020-06-19 DIAGNOSIS — I69351 Hemiplegia and hemiparesis following cerebral infarction affecting right dominant side: Secondary | ICD-10-CM | POA: Diagnosis not present

## 2020-06-19 DIAGNOSIS — N390 Urinary tract infection, site not specified: Secondary | ICD-10-CM | POA: Diagnosis not present

## 2020-06-19 DIAGNOSIS — I69320 Aphasia following cerebral infarction: Secondary | ICD-10-CM | POA: Diagnosis not present

## 2020-06-19 DIAGNOSIS — E1165 Type 2 diabetes mellitus with hyperglycemia: Secondary | ICD-10-CM | POA: Diagnosis not present

## 2020-06-20 DIAGNOSIS — I4891 Unspecified atrial fibrillation: Secondary | ICD-10-CM | POA: Diagnosis not present

## 2020-06-20 DIAGNOSIS — E1165 Type 2 diabetes mellitus with hyperglycemia: Secondary | ICD-10-CM | POA: Diagnosis not present

## 2020-06-20 DIAGNOSIS — N390 Urinary tract infection, site not specified: Secondary | ICD-10-CM | POA: Diagnosis not present

## 2020-06-20 DIAGNOSIS — I69320 Aphasia following cerebral infarction: Secondary | ICD-10-CM | POA: Diagnosis not present

## 2020-06-20 DIAGNOSIS — I69351 Hemiplegia and hemiparesis following cerebral infarction affecting right dominant side: Secondary | ICD-10-CM | POA: Diagnosis not present

## 2020-06-21 DIAGNOSIS — I69320 Aphasia following cerebral infarction: Secondary | ICD-10-CM | POA: Diagnosis not present

## 2020-06-21 DIAGNOSIS — N390 Urinary tract infection, site not specified: Secondary | ICD-10-CM | POA: Diagnosis not present

## 2020-06-21 DIAGNOSIS — I69351 Hemiplegia and hemiparesis following cerebral infarction affecting right dominant side: Secondary | ICD-10-CM | POA: Diagnosis not present

## 2020-06-21 DIAGNOSIS — E1165 Type 2 diabetes mellitus with hyperglycemia: Secondary | ICD-10-CM | POA: Diagnosis not present

## 2020-06-21 DIAGNOSIS — I4891 Unspecified atrial fibrillation: Secondary | ICD-10-CM | POA: Diagnosis not present

## 2020-06-22 ENCOUNTER — Encounter: Payer: Self-pay | Admitting: Urology

## 2020-06-22 ENCOUNTER — Other Ambulatory Visit: Payer: Self-pay

## 2020-06-22 ENCOUNTER — Telehealth: Payer: Self-pay

## 2020-06-22 ENCOUNTER — Ambulatory Visit (INDEPENDENT_AMBULATORY_CARE_PROVIDER_SITE_OTHER): Payer: Medicare Other | Admitting: Urology

## 2020-06-22 VITALS — BP 94/63 | HR 53 | Temp 98.4°F | Ht 64.0 in | Wt 136.0 lb

## 2020-06-22 DIAGNOSIS — R339 Retention of urine, unspecified: Secondary | ICD-10-CM | POA: Diagnosis not present

## 2020-06-22 MED ORDER — TAMSULOSIN HCL 0.4 MG PO CAPS: 0.4000 mg | ORAL_CAPSULE | Freq: Every day | ORAL | 11 refills | Status: AC

## 2020-06-22 NOTE — Telephone Encounter (Signed)
Spoke with drt or drt in law. Let her know Dr. Alyson Ingles ordered a urine culture on pts urine and will notify when it comes back.

## 2020-06-22 NOTE — Patient Instructions (Signed)
Acute Urinary Retention, Female  Acute urinary retention means that you cannot pee (urinate) at all, or that you pee too little and your bladder is not emptied completely. If it is not treated, it can lead to kidney damage or other serious problems. Follow these instructions at home:  Take over-the-counter and prescription medicines only as told by your doctor. Ask your doctor what medicines you should stay away from. Do not take any medicine unless your doctor says it is okay to do so.  If you were sent home with a tube that drains pee from the bladder (catheter), take care of it as told by your doctor.  Drink enough fluid to keep your pee clear or pale yellow.  If you were given an antibiotic, take it as told by your doctor. Do not stop taking the antibiotic even if you start to feel better.  Do not use any products that contain nicotine or tobacco, such as cigarettes and e-cigarettes. If you need help quitting, ask your doctor.  Watch for changes in your symptoms. Tell your doctor about them.  If told, keep track of any changes in your blood pressure at home. Tell your doctor about them.  Keep all follow-up visits as told by your doctor. This is important. Contact a doctor if:  You have spasms or you leak pee when you have spasms. Get help right away if:  You have chills or a fever.  You have blood in your pee.  You have a tube that drains the bladder and: ? The tube stops draining pee. ? The tube falls out. Summary  Acute urinary retention means that you cannot pee at all, or that you pee too little and your bladder is not emptied completely. If it is not treated, it can result in kidney damage or other serious problems.  If you were sent home with a tube that drains pee from the bladder, take care of it as told by your doctor.  Pay attention to any changes in your symptoms. Tell your doctor about them. This information is not intended to replace advice given to you by your  health care provider. Make sure you discuss any questions you have with your health care provider. Document Revised: 05/30/2017 Document Reviewed: 07/19/2016 Elsevier Patient Education  2020 Elsevier Inc.  

## 2020-06-22 NOTE — Progress Notes (Signed)
06/22/2020 10:19 AM   Sheila Ray Oct 08, 1940 [id number]  Referring provider: Sharion Balloon, [address],  [place] [address]  Urinary retention  HPI: [name] is a 79yo here for evaluation of urinary retention. She had a CVA in 05/03/2020 and since then she has had difficulty urinating. She has left sided paralysis which has since improved with PT. She was initially treated with CIC and then on 12/7 an indwelling foley was placed. She has had 2 UTIs since discharge from the hospital. No issues urinating prior to CVA. Currently she has an indwelling foley.    PMH: Past Medical History:  Diagnosis Date   Arthritis    Atrial fibrillation ([place])    Complication of anesthesia    Diabetes mellitus without complication ([place])    Diverticulosis    Fatigue    Gastric polyp    Goiter    Hyperlipidemia    Hypertension    Hypothyroid    taken off of thyroid medication 2012014    Obese    Osteopenia    PONV (postoperative nausea and vomiting)    Postmenopausal    Rosacea    Stroke ([place]) 01/15/2013   left thalamic  stroke, small vessel   Vitamin D deficiency     Surgical History: Past Surgical History:  Procedure Laterality Date   ABDOMINAL HYSTERECTOMY  1985   FOOT SURGERY Right 08/2002   IR CT HEAD LTD  05/03/2020   IR PERCUTANEOUS ART THROMBECTOMY/INFUSION INTRACRANIAL INC DIAG ANGIO  05/03/2020   RADIOLOGY WITH ANESTHESIA N/A 05/03/2020   Procedure: IR WITH ANESTHESIA;  Surgeon: Radiologist, Medication, MD;  Location: [facility];  Service: Radiology;  Laterality: N/A;   right knee arthroscopy   2013    TONSILLECTOMY     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   Procedure: RIGHT TOTAL KNEE ARTHROPLASTY;  Surgeon: Gearlean Alf, MD;  Location: [facility];  Service: Orthopedics;  Laterality: Right;    Home Medications:  Allergies as of 06/22/2020      Reactions   Crestor [rosuvastatin] Other (See Comments)   Cramps   Lipitor  [atorvastatin] Other (See Comments)   Cramps   Pravastatin Other (See Comments)   Cramps    Keflex [cephalexin] Hives      Medication List       Accurate as of June 22, 2020 10:19 AM. If you have any questions, ask your nurse or doctor.        acetaminophen 325 MG tablet Commonly known as: TYLENOL Take 2 tablets (650 mg total) by mouth every 4 (four) hours as needed for mild pain (or temp > 37.5 C (99.5 F)).   alendronate 70 MG tablet Commonly known as: FOSAMAX TAKE 1 TABLET BY MOUTH  WEEKLY AS DIRECTED What changed: See the new instructions.   apixaban 5 MG Tabs tablet Commonly known as: ELIQUIS Take 1 tablet (5 mg total) by mouth 2 (two) times daily.   Eliquis 5 MG Tabs tablet Generic drug: apixaban Take by mouth.   ciprofloxacin 500 MG tablet Commonly known as: CIPRO Take 1 tablet (500 mg total) by mouth every 12 (twelve) hours for 7 days.   diltiazem 240 MG 24 hr capsule Commonly known as: CARDIZEM CD Take 1 capsule (240 mg total) by mouth daily.   ezetimibe 10 MG tablet Commonly known as: ZETIA Take 1 tablet (10 mg total) by mouth daily.   ipratropium 0.06 % nasal spray Commonly known as: ATROVENT Place 1 spray into both  nostrils 2 (two) times daily.   lisinopril 40 MG tablet Commonly known as: ZESTRIL Take by mouth.   magnesium oxide 400 (241.3 Mg) MG tablet Commonly known as: MAG-OX Take 1 tablet (400 mg total) by mouth daily.   melatonin 3 MG Tabs tablet Take 1 tablet (3 mg total) by mouth at bedtime.   metFORMIN 1000 MG tablet Commonly known as: GLUCOPHAGE TAKE ONE-HALF TABLET BY  MOUTH DAILY WITH BREAKFAST   metoprolol succinate 100 MG 24 hr tablet Commonly known as: TOPROL-XL Take 1 tablet (100 mg total) by mouth daily. Take with or immediately following a meal.   onetouch ultrasoft lancets Use as instructed   saccharomyces boulardii 250 MG capsule Commonly known as: FLORASTOR Take 1 capsule (250 mg total) by mouth 2 (two) times  daily.   senna-docusate 8.6-50 MG tablet Commonly known as: Senokot-S Take 2 tablets by mouth 2 (two) times daily.   Vitamin B12 1000 MCG Tbcr Take 1,000 mcg by mouth daily.       Allergies:  Allergies  Allergen Reactions   Crestor [Rosuvastatin] Other (See Comments)    Cramps   Lipitor [Atorvastatin] Other (See Comments)    Cramps   Pravastatin Other (See Comments)    Cramps    Keflex [Cephalexin] Hives    Family History: Family History  Problem Relation Age of Onset   Osteoporosis Mother    Alzheimer's disease Mother 5   Arthritis Mother    Cancer Father        Lung   Arthritis Sister    Obesity Sister    Heart attack Brother 5   Heart disease Son    Arthritis Brother    Arthritis Sister    Cancer Sister        breast cancer    Social History:  reports that she has never smoked. She has never used smokeless tobacco. She reports that she does not drink alcohol and does not use drugs.  ROS: All other review of systems were reviewed and are negative except what is noted above in HPI  Physical Exam: BP 94/63    Pulse (!) 53    Temp 98.4 F (36.9 C)    Ht 5\' 4"  (1.626 m)    Wt 136 lb (61.7 kg)    BMI 23.34 kg/m   Constitutional:  Alert and oriented, No acute distress. HEENT: [place] AT, moist mucus membranes.  Trachea midline, no masses. Cardiovascular: No clubbing, cyanosis, or edema. Respiratory: Normal respiratory effort, no increased work of breathing. GI: Abdomen is soft, nontender, nondistended, no abdominal masses GU: No CVA tenderness.  Lymph: No cervical or inguinal lymphadenopathy. Skin: No rashes, bruises or suspicious lesions. Neurologic: Grossly intact, no focal deficits, moving all 4 extremities. Psychiatric: Normal mood and affect.  Laboratory Data: Lab Results  Component Value Date   WBC 9.6 06/14/2020   HGB 11.3 (L) 06/14/2020   HCT 37.6 06/14/2020   MCV 76.3 (L) 06/14/2020   PLT 408 (H) 06/14/2020    Lab Results   Component Value Date   CREATININE 0.67 06/14/2020    No results found for: PSA  No results found for: TESTOSTERONE  Lab Results  Component Value Date   HGBA1C 6.4 (H) 05/04/2020    Urinalysis    Component Value Date/Time   COLORURINE YELLOW 06/14/2020 0110   APPEARANCEUR HAZY (A) 06/14/2020 0110   LABSPEC 1.006 06/14/2020 0110   PHURINE 5.0 06/14/2020 0110   GLUCOSEU NEGATIVE 06/14/2020 0110   HGBUR MODERATE (A) 06/14/2020  [place] 06/14/2020 0110   KETONESUR NEGATIVE 06/14/2020 0110   PROTEINUR NEGATIVE 06/14/2020 0110   UROBILINOGEN 0.2 07/19/2014 0950   NITRITE NEGATIVE 06/14/2020 0110   LEUKOCYTESUR LARGE (A) 06/14/2020 0110    Lab Results  Component Value Date   LABMICR 18.0 10/29/2018   BACTERIA MANY (A) 06/14/2020    Pertinent Imaging:  No results found for this or any previous visit.  No results found for this or any previous visit.  No results found for this or any previous visit.  No results found for this or any previous visit.  Results for orders placed during the hospital encounter of 05/03/20  US RENAL  Narrative CLINICAL DATA:  Acute renal insufficiency  EXAM: RENAL / URINARY TRACT ULTRASOUND COMPLETE  COMPARISON:  None.  FINDINGS: Right Kidney:  Renal measurements: 10.3 by 6.3 x 4.7 cm = volume: 157.6 mL. Echogenicity within normal limits. No mass or hydronephrosis visualized. Mild prominence of the right renal pelvis is nonspecific.  Left Kidney:  Renal measurements: 10.2 x 6.1 by 4.9 cm = volume: 161.3 mL. Echogenicity within normal limits. No mass or hydronephrosis visualized.  Bladder:  Decompressed and not evaluated.  Other:  None.  IMPRESSION: 1. Mild distension of the right renal pelvis without frank hydronephrosis. Otherwise unremarkable exam.   Electronically Signed By: Randa Ngo M.D. On: 05/14/2020 15:47  No results found for this or any previous visit.  No results found for  this or any previous visit.  No results found for this or any previous visit.   Assessment & Plan:    1. Urinary retention -We will start flomax 0.4mg  daily. I will have her followup in 1 week for a voiding trial   No follow-ups on file.  Nicolette Bang, MD  [facility] [place]

## 2020-06-22 NOTE — Progress Notes (Signed)
Urological Symptom Review  Patient is experiencing the following symptoms: Urinary tract infection   Review of Systems  Gastrointestinal (upper)  : Negative for upper GI symptoms  Gastrointestinal (lower) : Negative for lower GI symptoms  Constitutional : Negative for symptoms  Skin: Negative for skin symptoms  Eyes: Negative for eye symptoms  Ear/Nose/Throat : Negative for Ear/Nose/Throat symptoms  Hematologic/Lymphatic: Negative for Hematologic/Lymphatic symptoms  Cardiovascular : Negative for cardiovascular symptoms  Respiratory : Negative for respiratory symptoms  Endocrine: Excessive thirst  Musculoskeletal: Negative for musculoskeletal symptoms  Neurological: Negative for neurological symptoms  Psychologic: Negative for psychiatric symptoms

## 2020-06-22 NOTE — Progress Notes (Signed)
Cath Change/ Replacement  Patient is present today for a catheter change due to urinary retention.  36ml of water was removed from the balloon, a 14FR foley cath was removed with out difficulty.  Patient was cleaned and prepped in a sterile fashion with betadine. A 18 FR foley cath was replaced into the bladder no complications were noted Urine return was noted 467ml and urine was milky yellow  in color. The balloon was filled with 21ml of sterile water. A bedside bag was attached for drainage. Patient was given proper instruction on catheter care.    Performed by: Dashonda Bonneau,LPN  Follow up:  1 wk

## 2020-06-26 ENCOUNTER — Encounter (HOSPITAL_COMMUNITY): Payer: Self-pay | Admitting: Emergency Medicine

## 2020-06-26 ENCOUNTER — Inpatient Hospital Stay (HOSPITAL_COMMUNITY)
Admission: EM | Admit: 2020-06-26 | Discharge: 2020-06-28 | DRG: 640 | Disposition: A | Discharge status: Skilled Nursing Facility | Payer: Medicare Other | Attending: Family Medicine | Admitting: Family Medicine

## 2020-06-26 ENCOUNTER — Other Ambulatory Visit: Payer: Self-pay

## 2020-06-26 ENCOUNTER — Emergency Department (HOSPITAL_COMMUNITY): Payer: Medicare Other

## 2020-06-26 DIAGNOSIS — E871 Hypo-osmolality and hyponatremia: Principal | ICD-10-CM | POA: Diagnosis present

## 2020-06-26 DIAGNOSIS — Z8261 Family history of arthritis: Secondary | ICD-10-CM

## 2020-06-26 DIAGNOSIS — I482 Chronic atrial fibrillation, unspecified: Secondary | ICD-10-CM | POA: Diagnosis not present

## 2020-06-26 DIAGNOSIS — I10 Essential (primary) hypertension: Secondary | ICD-10-CM | POA: Diagnosis present

## 2020-06-26 DIAGNOSIS — I639 Cerebral infarction, unspecified: Secondary | ICD-10-CM | POA: Diagnosis not present

## 2020-06-26 DIAGNOSIS — I5032 Chronic diastolic (congestive) heart failure: Secondary | ICD-10-CM | POA: Diagnosis not present

## 2020-06-26 DIAGNOSIS — E039 Hypothyroidism, unspecified: Secondary | ICD-10-CM | POA: Diagnosis present

## 2020-06-26 DIAGNOSIS — G9341 Metabolic encephalopathy: Secondary | ICD-10-CM | POA: Diagnosis not present

## 2020-06-26 DIAGNOSIS — Z87440 Personal history of urinary (tract) infections: Secondary | ICD-10-CM

## 2020-06-26 DIAGNOSIS — Z820 Family history of epilepsy and other diseases of the nervous system: Secondary | ICD-10-CM | POA: Diagnosis not present

## 2020-06-26 DIAGNOSIS — Z8262 Family history of osteoporosis: Secondary | ICD-10-CM

## 2020-06-26 DIAGNOSIS — Z960 Presence of urogenital implants: Secondary | ICD-10-CM | POA: Diagnosis present

## 2020-06-26 DIAGNOSIS — I69319 Unspecified symptoms and signs involving cognitive functions following cerebral infarction: Secondary | ICD-10-CM | POA: Diagnosis not present

## 2020-06-26 DIAGNOSIS — M170 Bilateral primary osteoarthritis of knee: Secondary | ICD-10-CM | POA: Diagnosis not present

## 2020-06-26 DIAGNOSIS — Z20822 Contact with and (suspected) exposure to covid-19: Secondary | ICD-10-CM | POA: Diagnosis present

## 2020-06-26 DIAGNOSIS — Z791 Long term (current) use of non-steroidal anti-inflammatories (NSAID): Secondary | ICD-10-CM

## 2020-06-26 DIAGNOSIS — Z7901 Long term (current) use of anticoagulants: Secondary | ICD-10-CM

## 2020-06-26 DIAGNOSIS — I69320 Aphasia following cerebral infarction: Secondary | ICD-10-CM

## 2020-06-26 DIAGNOSIS — I480 Paroxysmal atrial fibrillation: Secondary | ICD-10-CM | POA: Diagnosis present

## 2020-06-26 DIAGNOSIS — K5901 Slow transit constipation: Secondary | ICD-10-CM | POA: Diagnosis not present

## 2020-06-26 DIAGNOSIS — R262 Difficulty in walking, not elsewhere classified: Secondary | ICD-10-CM | POA: Diagnosis not present

## 2020-06-26 DIAGNOSIS — M6281 Muscle weakness (generalized): Secondary | ICD-10-CM | POA: Diagnosis not present

## 2020-06-26 DIAGNOSIS — D649 Anemia, unspecified: Secondary | ICD-10-CM | POA: Diagnosis not present

## 2020-06-26 DIAGNOSIS — Z8719 Personal history of other diseases of the digestive system: Secondary | ICD-10-CM

## 2020-06-26 DIAGNOSIS — Z888 Allergy status to other drugs, medicaments and biological substances status: Secondary | ICD-10-CM

## 2020-06-26 DIAGNOSIS — E785 Hyperlipidemia, unspecified: Secondary | ICD-10-CM | POA: Diagnosis not present

## 2020-06-26 DIAGNOSIS — R279 Unspecified lack of coordination: Secondary | ICD-10-CM | POA: Diagnosis not present

## 2020-06-26 DIAGNOSIS — E119 Type 2 diabetes mellitus without complications: Secondary | ICD-10-CM | POA: Diagnosis present

## 2020-06-26 DIAGNOSIS — Z978 Presence of other specified devices: Secondary | ICD-10-CM | POA: Diagnosis not present

## 2020-06-26 DIAGNOSIS — F0391 Unspecified dementia with behavioral disturbance: Secondary | ICD-10-CM | POA: Diagnosis present

## 2020-06-26 DIAGNOSIS — Z7984 Long term (current) use of oral hypoglycemic drugs: Secondary | ICD-10-CM | POA: Diagnosis not present

## 2020-06-26 DIAGNOSIS — R4182 Altered mental status, unspecified: Secondary | ICD-10-CM

## 2020-06-26 DIAGNOSIS — E1159 Type 2 diabetes mellitus with other circulatory complications: Secondary | ICD-10-CM | POA: Diagnosis not present

## 2020-06-26 DIAGNOSIS — Z79899 Other long term (current) drug therapy: Secondary | ICD-10-CM

## 2020-06-26 DIAGNOSIS — J811 Chronic pulmonary edema: Secondary | ICD-10-CM | POA: Diagnosis not present

## 2020-06-26 DIAGNOSIS — N319 Neuromuscular dysfunction of bladder, unspecified: Secondary | ICD-10-CM | POA: Diagnosis not present

## 2020-06-26 DIAGNOSIS — M810 Age-related osteoporosis without current pathological fracture: Secondary | ICD-10-CM | POA: Diagnosis not present

## 2020-06-26 DIAGNOSIS — E860 Dehydration: Secondary | ICD-10-CM | POA: Diagnosis not present

## 2020-06-26 DIAGNOSIS — J90 Pleural effusion, not elsewhere classified: Secondary | ICD-10-CM | POA: Diagnosis not present

## 2020-06-26 DIAGNOSIS — J9811 Atelectasis: Secondary | ICD-10-CM | POA: Diagnosis not present

## 2020-06-26 DIAGNOSIS — Z8249 Family history of ischemic heart disease and other diseases of the circulatory system: Secondary | ICD-10-CM

## 2020-06-26 DIAGNOSIS — R278 Other lack of coordination: Secondary | ICD-10-CM | POA: Diagnosis not present

## 2020-06-26 DIAGNOSIS — Z803 Family history of malignant neoplasm of breast: Secondary | ICD-10-CM

## 2020-06-26 DIAGNOSIS — R339 Retention of urine, unspecified: Secondary | ICD-10-CM | POA: Diagnosis not present

## 2020-06-26 DIAGNOSIS — I517 Cardiomegaly: Secondary | ICD-10-CM | POA: Diagnosis not present

## 2020-06-26 LAB — GLUCOSE, CAPILLARY: Glucose-Capillary: 124 mg/dL — ABNORMAL HIGH (ref 70–99)

## 2020-06-26 LAB — COMPREHENSIVE METABOLIC PANEL
ALT: 18 U/L (ref 0–44)
AST: 18 U/L (ref 15–41)
Albumin: 3.2 g/dL — ABNORMAL LOW (ref 3.5–5.0)
Alkaline Phosphatase: 87 U/L (ref 38–126)
Anion gap: 11 (ref 5–15)
BUN: 16 mg/dL (ref 8–23)
CO2: 20 mmol/L — ABNORMAL LOW (ref 22–32)
Calcium: 9 mg/dL (ref 8.9–10.3)
Chloride: 94 mmol/L — ABNORMAL LOW (ref 98–111)
Creatinine, Ser: 0.61 mg/dL (ref 0.44–1.00)
GFR, Estimated: >60 mL/min (ref >60)
Glucose, Bld: 135 mg/dL — ABNORMAL HIGH (ref 70–99)
Potassium: 4.3 mmol/L (ref 3.5–5.1)
Sodium: 125 mmol/L — ABNORMAL LOW (ref 135–145)
Total Bilirubin: 0.5 mg/dL (ref 0.3–1.2)
Total Protein: 6.1 g/dL — ABNORMAL LOW (ref 6.5–8.1)

## 2020-06-26 LAB — CBC WITH DIFFERENTIAL/PLATELET
Abs Immature Granulocytes: 0.05 10*3/uL (ref 0.00–0.07)
Basophils Absolute: 0.1 10*3/uL (ref 0.0–0.1)
Basophils Relative: 1 %
Eosinophils Absolute: 0.1 10*3/uL (ref 0.0–0.5)
Eosinophils Relative: 1 %
HCT: 34.5 % — ABNORMAL LOW (ref 36.0–46.0)
Hemoglobin: 10.4 g/dL — ABNORMAL LOW (ref 12.0–15.0)
Immature Granulocytes: 0 %
Lymphocytes Relative: 12 %
Lymphs Abs: 1.4 10*3/uL (ref 0.7–4.0)
MCH: 23.3 pg — ABNORMAL LOW (ref 26.0–34.0)
MCHC: 30.1 g/dL (ref 30.0–36.0)
MCV: 77.2 fL — ABNORMAL LOW (ref 80.0–100.0)
Monocytes Absolute: 0.9 10*3/uL (ref 0.1–1.0)
Monocytes Relative: 7 %
Neutro Abs: 9.4 10*3/uL — ABNORMAL HIGH (ref 1.7–7.7)
Neutrophils Relative %: 79 %
Platelets: 354 10*3/uL (ref 150–400)
RBC: 4.47 MIL/uL (ref 3.87–5.11)
RDW: 17.4 % — ABNORMAL HIGH (ref 11.5–15.5)
WBC: 11.8 10*3/uL — ABNORMAL HIGH (ref 4.0–10.5)
nRBC: 0 % (ref 0.0–0.2)

## 2020-06-26 LAB — TSH: TSH: 6.501 u[IU]/mL — ABNORMAL HIGH (ref 0.350–4.500)

## 2020-06-26 LAB — URINALYSIS, ROUTINE W REFLEX MICROSCOPIC
Bilirubin Urine: NEGATIVE
Glucose, UA: NEGATIVE mg/dL
Ketones, ur: 5 mg/dL — AB
Nitrite: NEGATIVE
Protein, ur: 100 mg/dL — AB
Specific Gravity, Urine: 1.015 (ref 1.005–1.030)
WBC, UA: >50 WBC/hpf — ABNORMAL HIGH (ref 0–5)
pH: 5 (ref 5.0–8.0)

## 2020-06-26 LAB — OSMOLALITY, URINE: Osmolality, Ur: 379 mOsm/kg (ref 300–900)

## 2020-06-26 LAB — RESP PANEL BY RT-PCR (FLU A&B, COVID) ARPGX2
Influenza A by PCR: NEGATIVE
Influenza B by PCR: NEGATIVE
SARS Coronavirus 2 by RT PCR: NEGATIVE

## 2020-06-26 LAB — OSMOLALITY: Osmolality: 269 mOsm/kg — ABNORMAL LOW (ref 275–295)

## 2020-06-26 LAB — CBG MONITORING, ED: Glucose-Capillary: 129 mg/dL — ABNORMAL HIGH (ref 70–99)

## 2020-06-26 IMAGING — CT CT HEAD W/O CM
4 series · 17 of 47 positions shown, 19 images · non-contrast
Comparison: MR brain [DATE], CT brain [DATE]

CLINICAL DATA: Mental status changes

EXAM:
CT HEAD WITHOUT CONTRAST
TECHNIQUE: Contiguous axial images were obtained from the base of the skull
through the vertex without intravenous contrast.

[Series 2: head w o · axial · 0.41mm/px · z∈[+1672,+1792]mm · 7 of 33 slices shown, 9 images]
[im 5/33  brain]
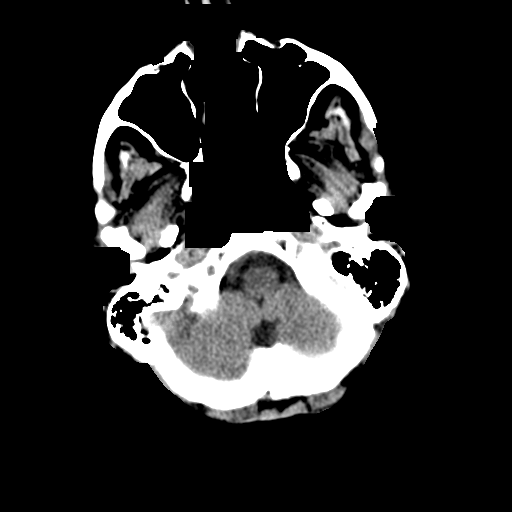
[im 5/33  bone]
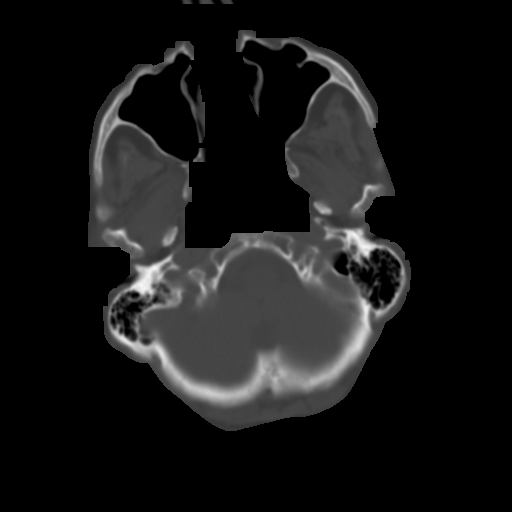
[im 9/33  brain]
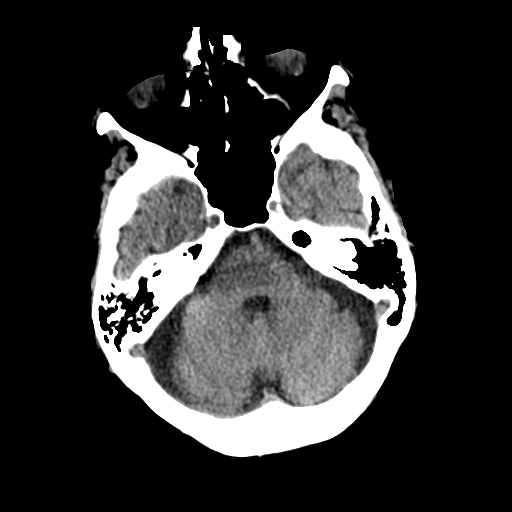
[im 13/33  brain]
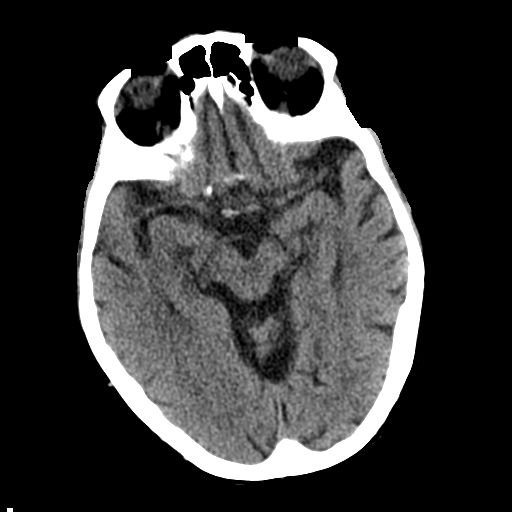
[im 17/33  brain]
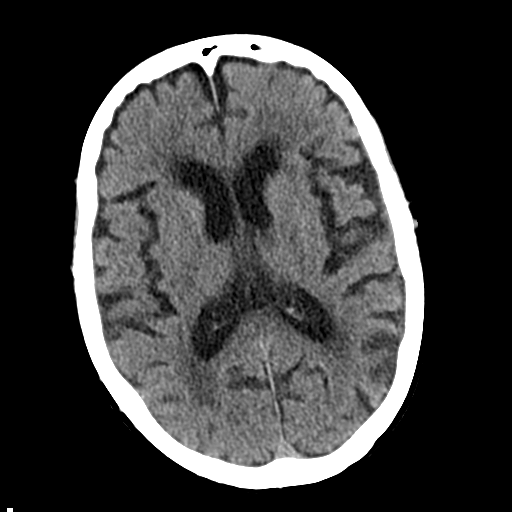
[im 21/33  brain]
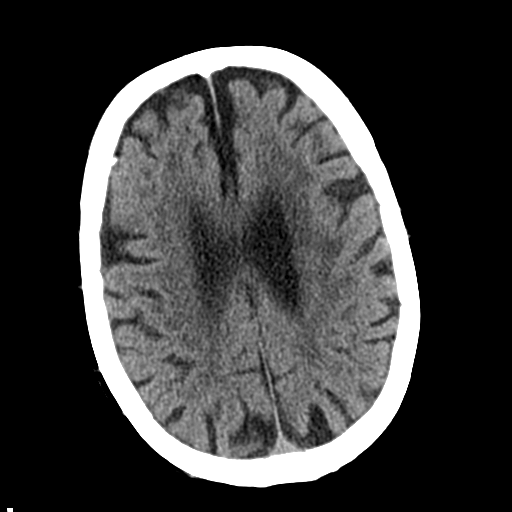
[im 21/33  bone]
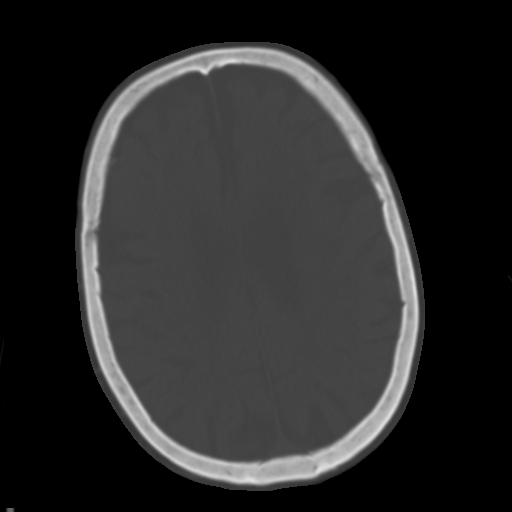
[im 25/33  brain]
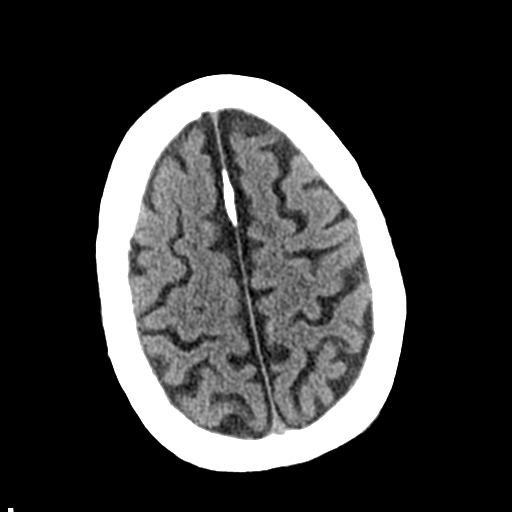
[im 29/33  brain]
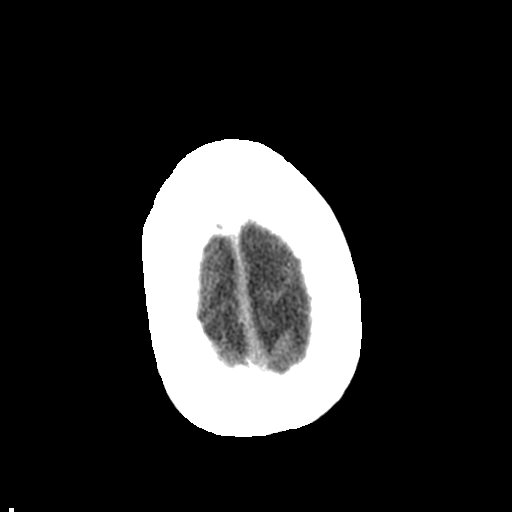

[Series 3: head bone · axial · 0.41mm/px · z∈[+1668,+1724]mm · 4 of 83 slices shown]
[im 9/83  bone]
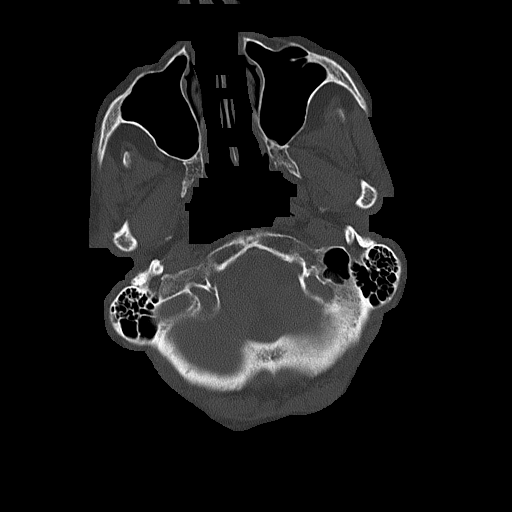
[im 17/83  bone]
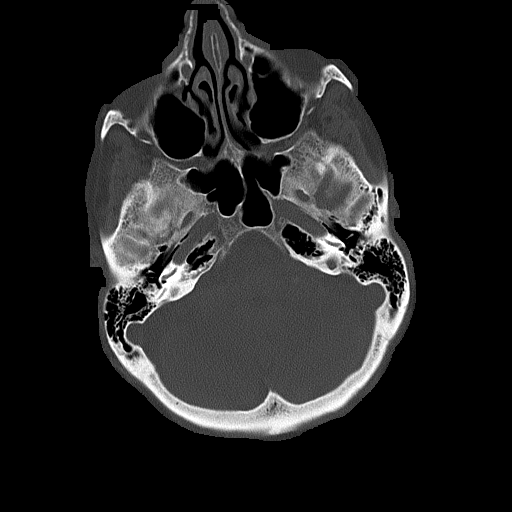
[im 25/83  bone]
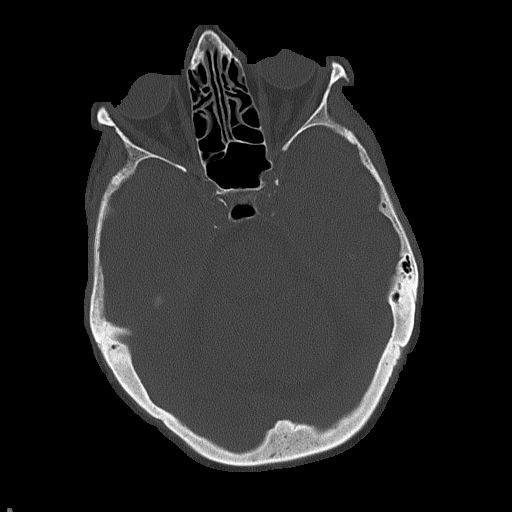
[im 37/83  bone]
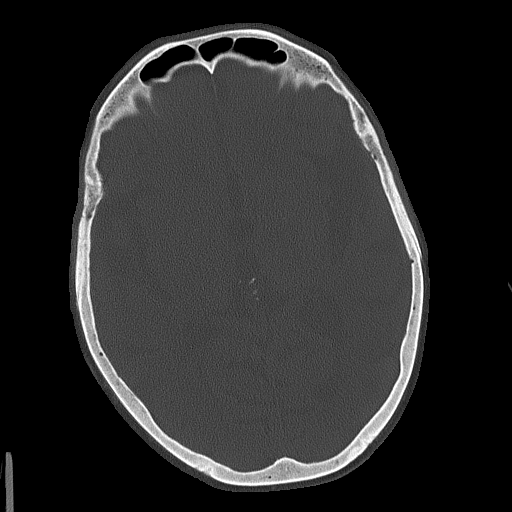

[Series 4: coronal soft · coronal · 0.32mm/px · 3 of 71 slices shown]
[im 24/71  brain]
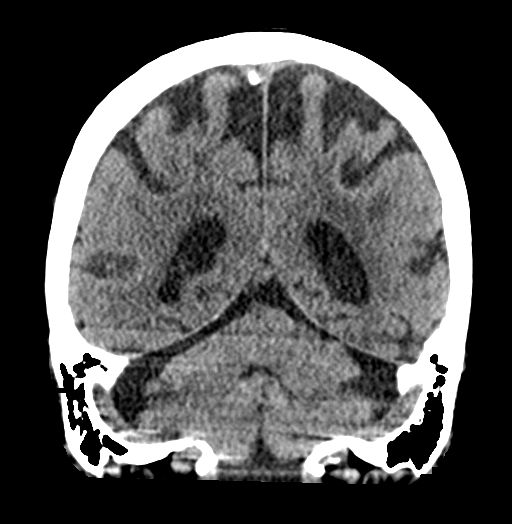
[im 32/71  brain]
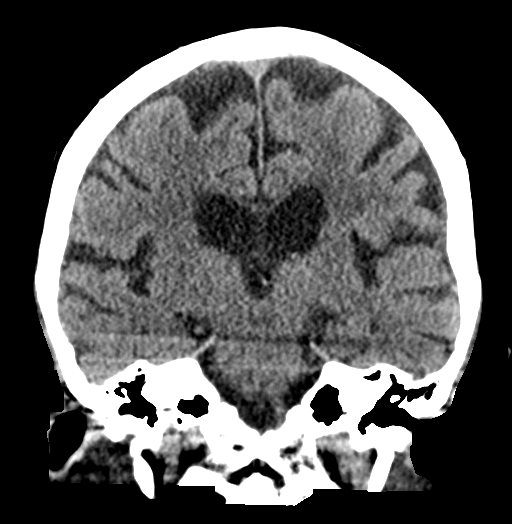
[im 39/71  brain]
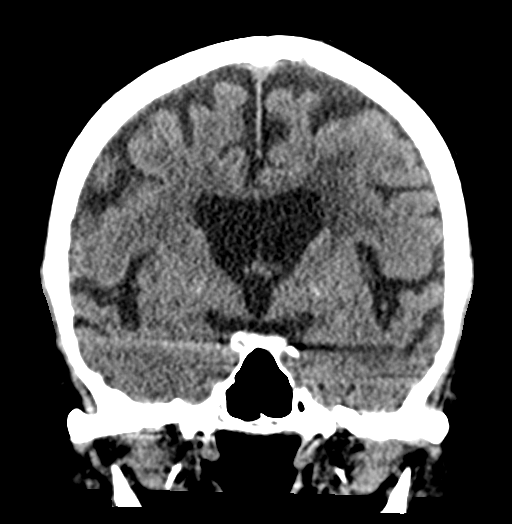

[Series 5: sagittal soft · sagittal · 0.35mm/px · 3 of 55 slices shown]
[im 19/55  brain]
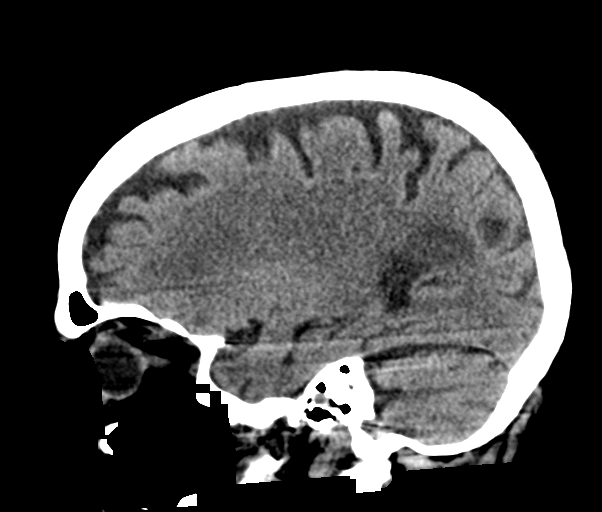
[im 28/55  brain]
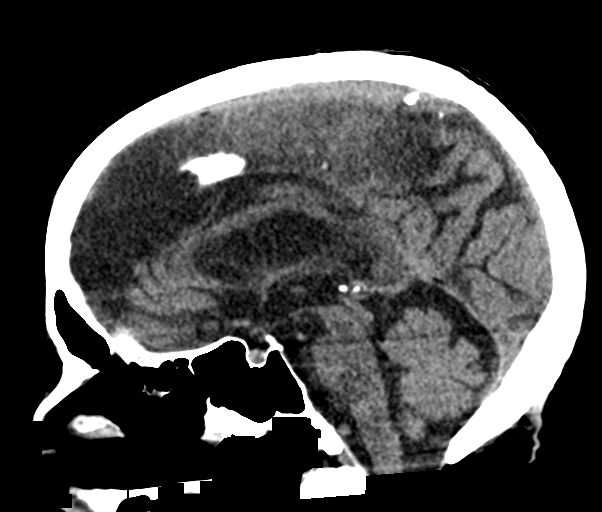
[im 37/55  brain]
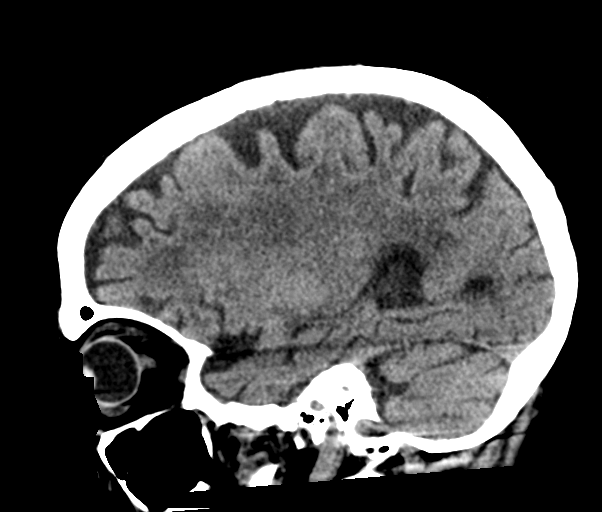

[17 of 47 positions shown; findings below may reference images not displayed]

FINDINGS: Brain: No evidence of acute infarction, hemorrhage, extra-axial
collection, ventriculomegaly, or mass effect. Generalized cerebral
atrophy. Periventricular white matter low attenuation likely
secondary to microangiopathy.

Vascular: Cerebrovascular atherosclerotic calcifications are noted.

Skull: Negative for fracture or focal lesion.

Sinuses/Orbits: Visualized portions of the orbits are unremarkable.
Visualized portions of the paranasal sinuses are unremarkable.
Visualized portions of the mastoid air cells are unremarkable.

Other: None.
IMPRESSION: 1. No acute intracranial abnormality.
2. Chronic small vessel ischemic changes and cerebral atrophy.

## 2020-06-26 MED ORDER — ONDANSETRON HCL 4 MG/2ML IJ SOLN: 4.0000 mg | Freq: Four times a day (QID) | INTRAMUSCULAR | Status: DC | PRN

## 2020-06-26 MED ORDER — SENNOSIDES-DOCUSATE SODIUM 8.6-50 MG PO TABS
2.0000 | ORAL_TABLET | Freq: Two times a day (BID) | ORAL | Status: DC
  Administered 2020-06-26 – 2020-06-28 (×5): 2 via ORAL
  Filled 2020-06-26 (×5): qty 2

## 2020-06-26 MED ORDER — VITAMIN B-12 1000 MCG PO TABS
1000.0000 ug | ORAL_TABLET | Freq: Every day | ORAL | Status: DC
  Administered 2020-06-26 – 2020-06-28 (×3): 1000 ug via ORAL
  Filled 2020-06-26 (×3): qty 1

## 2020-06-26 MED ORDER — METOPROLOL SUCCINATE ER 50 MG PO TB24
100.0000 mg | ORAL_TABLET | Freq: Every day | ORAL | Status: DC
  Filled 2020-06-26: qty 2

## 2020-06-26 MED ORDER — ONDANSETRON HCL 4 MG PO TABS: 4.0000 mg | ORAL_TABLET | Freq: Four times a day (QID) | ORAL | Status: DC | PRN

## 2020-06-26 MED ORDER — MAGNESIUM OXIDE 400 (241.3 MG) MG PO TABS
400.0000 mg | ORAL_TABLET | Freq: Every day | ORAL | Status: DC
Start: 2020-06-26 — End: 2020-06-28
  Administered 2020-06-26 – 2020-06-28 (×3): 400 mg via ORAL
  Filled 2020-06-26 (×3): qty 1

## 2020-06-26 MED ORDER — INSULIN ASPART 100 UNIT/ML ~~LOC~~ SOLN
0.0000 [IU] | Freq: Three times a day (TID) | SUBCUTANEOUS | Status: DC
  Administered 2020-06-26 – 2020-06-27 (×4): 2 [IU] via SUBCUTANEOUS
  Filled 2020-06-26: qty 1

## 2020-06-26 MED ORDER — SACCHAROMYCES BOULARDII 250 MG PO CAPS
250.0000 mg | ORAL_CAPSULE | Freq: Two times a day (BID) | ORAL | Status: DC
  Administered 2020-06-26 – 2020-06-28 (×4): 250 mg via ORAL
  Filled 2020-06-26 (×8): qty 1

## 2020-06-26 MED ORDER — QUETIAPINE FUMARATE 25 MG PO TABS
25.0000 mg | ORAL_TABLET | Freq: Every day | ORAL | Status: DC
  Administered 2020-06-26 – 2020-06-27 (×3): 25 mg via ORAL
  Filled 2020-06-26 (×3): qty 1

## 2020-06-26 MED ORDER — DILTIAZEM HCL ER COATED BEADS 240 MG PO CP24
240.0000 mg | ORAL_CAPSULE | Freq: Every day | ORAL | Status: DC
  Filled 2020-06-26 (×3): qty 1

## 2020-06-26 MED ORDER — ACETAMINOPHEN 650 MG RE SUPP: 650.0000 mg | Freq: Four times a day (QID) | RECTAL | Status: DC | PRN

## 2020-06-26 MED ORDER — MELATONIN 3 MG PO TABS
3.0000 mg | ORAL_TABLET | Freq: Every day | ORAL | Status: DC
  Administered 2020-06-26 – 2020-06-27 (×2): 3 mg via ORAL
  Filled 2020-06-26 (×2): qty 1

## 2020-06-26 MED ORDER — APIXABAN 5 MG PO TABS
5.0000 mg | ORAL_TABLET | Freq: Two times a day (BID) | ORAL | Status: DC
  Administered 2020-06-26 – 2020-06-28 (×4): 5 mg via ORAL
  Filled 2020-06-26 (×4): qty 1

## 2020-06-26 MED ORDER — INSULIN ASPART 100 UNIT/ML ~~LOC~~ SOLN: 0.0000 [IU] | Freq: Every day | SUBCUTANEOUS | Status: DC

## 2020-06-26 MED ORDER — ACETAMINOPHEN 325 MG PO TABS: 650.0000 mg | ORAL_TABLET | Freq: Four times a day (QID) | ORAL | Status: DC | PRN

## 2020-06-26 MED ORDER — IPRATROPIUM BROMIDE 0.03 % NA SOLN
1.0000 | Freq: Two times a day (BID) | NASAL | Status: DC
  Filled 2020-06-26: qty 15

## 2020-06-26 MED ORDER — TAMSULOSIN HCL 0.4 MG PO CAPS
0.4000 mg | ORAL_CAPSULE | Freq: Every day | ORAL | Status: DC
  Administered 2020-06-26 – 2020-06-28 (×3): 0.4 mg via ORAL
  Filled 2020-06-26 (×3): qty 1

## 2020-06-26 MED ORDER — ACETAMINOPHEN 325 MG PO TABS: 650.0000 mg | ORAL_TABLET | ORAL | Status: DC | PRN

## 2020-06-26 MED ORDER — SODIUM CHLORIDE 0.9 % IV SOLN: INTRAVENOUS | Status: DC

## 2020-06-26 MED ORDER — SODIUM CHLORIDE 0.9 % IV BOLUS
500.0000 mL | Freq: Once | INTRAVENOUS | Status: AC
  Administered 2020-06-26: 13:00:00 500 mL via INTRAVENOUS

## 2020-06-26 MED ORDER — EZETIMIBE 10 MG PO TABS
10.0000 mg | ORAL_TABLET | Freq: Every day | ORAL | Status: DC
  Administered 2020-06-27 – 2020-06-28 (×2): 10 mg via ORAL
  Filled 2020-06-26 (×4): qty 1

## 2020-06-26 MED ORDER — HALOPERIDOL LACTATE 5 MG/ML IJ SOLN: 2.0000 mg | Freq: Four times a day (QID) | INTRAMUSCULAR | Status: DC | PRN

## 2020-06-26 NOTE — ED Triage Notes (Signed)
Per daughter in law, pt stated having confusion since christmas day. Pt was stating she was on a plane this morning at 0200. HX of frequent UTI's

## 2020-06-26 NOTE — ED Notes (Signed)
Pt placed on bedpan d/t stating that she needed to have bowel movement. Once on the bedpan, pt stated that she was not able to go and felt like she didn't need to go. Pt was cleaned and repositioned in bed for comfort. No other complaints at this time. Will continue to monitor.

## 2020-06-26 NOTE — TOC Initial Note (Signed)
Transition of Care [facility]) - Initial/Assessment Note    Patient Details  Name: Sheila Ray MRN: [id number] Date of Birth: 25-Aug-1940  Transition of [facility]) CM/SW Contact:    Villa Herb, LCSWA Phone Number: 06/26/2020, 7:35 PM  Clinical Narrative:                 Pt admitted due to Acute metabolic encephalopathy. TOC consulted for possible SNF placement. CSW spoke to pts son Lesli Albee [phone] to complete assessment. Per pts son pt has been living with him and his wife since 12/7 prior she had been living alone. Per son pt has been making progress with therapy and in completing ADLs. Pt is active with [facility] for PT, OT, and ST. Mr. Allyson Sabal states that pt uses a walker to ambulate. Per Mr. Allyson Sabal they also have people in the home from 8-5 on weekdays and 8 hour shift on weekends. CSW inquired about Mr. Hazle Coca interest in SNF placement. Mr. Allyson Sabal is interested. Per Mr. Allyson Sabal the family has done the best they can but pt has been having "bizarre mental aspect" and can be verbally abusive. Per Mr. Allyson Sabal pt passed her Alzheimer's test but was prescribed medication for paranoia. Mr. Allyson Sabal states pt was good for 10 days then things changed. Family is also interested in LTC for pt. CSW inquired if pt had Medicaid, pt does not. CSW informed Mr. Allyson Sabal that LTC would be private pay. Mr. Allyson Sabal interested more information about filling out a Medicaid application. Mr. Allyson Sabal states his goal is for pt to become independent again. TOC to follow.   Expected Discharge Plan: Skilled Nursing Facility Barriers to Discharge: Continued Medical Work up   Patient Goals and CMS Choice Patient states their goals for this hospitalization and ongoing recovery are:: Go to SNF CMS Medicare.gov Compare Post Acute Care list provided to:: Patient Represenative (must comment) (pts son) Choice offered to / list presented to : Adult Children  Expected Discharge Plan and Services Expected Discharge Plan:  Skilled Nursing Facility In-house Referral: Clinical Social Work Discharge Planning Services: CM Consult Post Acute Care Choice: Skilled Nursing Facility Living arrangements for the past 2 months: Single Family Home                 DME Arranged: N/A DME Agency: NA       HH Arranged: NA HH Agency: NA        Prior Living Arrangements/Services Living arrangements for the past 2 months: Single Family Home Lives with:: Adult Children Patient language and need for interpreter reviewed:: Yes Do you feel safe going back to the place where you live?: Yes      Need for Family Participation in Patient Care: Yes (Comment) Care giver support system in place?: Yes (comment) Current home services: DME,Sitter,Home OT,Home PT Criminal Activity/Legal Involvement Pertinent to Current Situation/Hospitalization: No - Comment as needed  Activities of Daily Living      Permission Sought/Granted                  Emotional Assessment Appearance:: Appears stated age Attitude/Demeanor/Rapport: Unable to Assess Affect (typically observed): Unable to Assess Orientation: : Fluctuating Orientation (Suspected and/or reported Sundowners) Alcohol / Substance Use: Not Applicable Psych Involvement: No (comment)  Admission diagnosis:  Acute metabolic encephalopathy [G93.41] Patient Active Problem List   Diagnosis Date Noted  . Acute metabolic encephalopathy 06/26/2020  . Urinary retention 06/22/2020  . Hyperglycemia   . Chronic diastolic congestive heart failure (HCC)   .  History of hypertension   . Neurogenic bladder   . Acute lower UTI   . Essential hypertension   . Hyponatremia   . Slow transit constipation   . Hypokalemia   . Controlled type 2 diabetes mellitus with hyperglycemia, without long-term current use of insulin ([place])   . 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 Acute ischemic stroke ([place]) 05/03/2020  . 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 Hypoxia    . Encephalopathy acute   . Atrial fibrillation ([place]) 08/19/2016  . OA (osteoarthritis) of knee 07/25/2014  . Hypothyroid 03/04/2013  . Stroke ([place]) 01/15/2013  . Osteoporosis 12/02/2012  . DM (diabetes mellitus) ([place]) 10/22/2012  . Hypertension associated with diabetes ([place]) 10/22/2012  . Hyperlipidemia associated with type 2 diabetes mellitus ([place]) 10/22/2012  . Vitamin D deficiency 09/13/2012  . Obesity (BMI 30-39.9) 09/13/2012   PCP:  Sharion Balloon, FNP Pharmacy:   [id number] [place], [address] [place] [address] Phone: [phone] Fax: [phone]  [facility], [address], [address] [place], [place] [address] Phone: [phone] Fax: [phone]  [facility] [place], [place] [address] [place] [address] Phone: [phone] Fax: [phone]     Social Determinants of Health (SDOH) Interventions    Readmission Risk Interventions No flowsheet data found.

## 2020-06-26 NOTE — ED Provider Notes (Signed)
[facility] EMERGENCY DEPARTMENT Provider Note   CSN: [id number] Arrival date & time: 06/26/20  [id number]     History Chief Complaint  Patient presents with   Altered Mental Status    Sheila Ray is a 79 y.o. female.  Patient brought in by family for acute mental status changes.  Patient's had a long indwelling Foley catheter since December 15.  There is been some question about urinary tract infection.  Patient been on Cipro seen by Dr. Noah Delaine from urology.  Had a culture done on the 15th that was equivocal.  Urology is done another culture.  With the results were not back yet.  Family was concerned that maybe she is got a worsening urinary tract infection.  Patient does have some degree of some dementia.  But things seem to be much worse overnight.  Urology also started patient on Flomax.  She is still taking the Cipro        Past Medical History:  Diagnosis Date   Arthritis    Atrial fibrillation ([place])    Complication of anesthesia    Diabetes mellitus without complication ([place])    Diverticulosis    Fatigue    Gastric polyp    Goiter    Hyperlipidemia    Hypertension    Hypothyroid    taken off of thyroid medication 2012014    Obese    Osteopenia    PONV (postoperative nausea and vomiting)    Postmenopausal    Rosacea    Stroke ([place]) 01/15/2013   left thalamic  stroke, small vessel   Vitamin D deficiency     Patient Active Problem List   Diagnosis Date Noted   Urinary retention 06/22/2020   Hyperglycemia    Chronic diastolic congestive heart failure ([place])    History of hypertension    Neurogenic bladder    Acute lower UTI    Essential hypertension    Hyponatremia    Slow transit constipation    Hypokalemia    Controlled type 2 diabetes mellitus with hyperglycemia, without long-term current use of insulin (HCC)    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   Acute ischemic stroke ([place]) 05/03/2020   Middle  cerebral artery embolism, left 05/03/2020   Hypoxia    Encephalopathy acute    Atrial fibrillation ([place]) 08/19/2016   OA (osteoarthritis) of knee 07/25/2014   Hypothyroid 03/04/2013   Stroke ([place]) 01/15/2013   Osteoporosis 12/02/2012   DM (diabetes mellitus) ([place]) 10/22/2012   Hypertension associated with diabetes ([place]) 10/22/2012   Hyperlipidemia associated with type 2 diabetes mellitus ([place]) 10/22/2012   Vitamin D deficiency 09/13/2012   Obesity (BMI 30-39.9) 09/13/2012    Past Surgical History:  Procedure Laterality Date   ABDOMINAL HYSTERECTOMY  1985   FOOT SURGERY Right 08/2002   IR CT HEAD LTD  05/03/2020   IR PERCUTANEOUS ART THROMBECTOMY/INFUSION INTRACRANIAL INC DIAG ANGIO  05/03/2020   RADIOLOGY WITH ANESTHESIA N/A 05/03/2020   Procedure: IR WITH ANESTHESIA;  Surgeon: Radiologist, Medication, MD;  Location: [place];  Service: Radiology;  Laterality: N/A;   right knee arthroscopy   2013    TONSILLECTOMY     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   Procedure: RIGHT TOTAL KNEE ARTHROPLASTY;  Surgeon: Gearlean Alf, MD;  Location: [facility];  Service: Orthopedics;  Laterality: Right;     OB History   No obstetric history on file.     Family History  Problem Relation Age of Onset   Osteoporosis Mother  Alzheimer's disease Mother 64   Arthritis Mother    Cancer Father        Lung   Arthritis Sister    Obesity Sister    Heart attack Brother 25   Heart disease Son    Arthritis Brother    Arthritis Sister    Cancer Sister        breast cancer    Social History   Tobacco Use   Smoking status: Never Smoker   Smokeless tobacco: Never Used  Building services engineer Use: Never used  Substance Use Topics   Alcohol use: No   Drug use: No    Home Medications Prior to Admission medications   Medication Sig Start Date End Date Taking? Authorizing Provider  acetaminophen (TYLENOL) 325 MG tablet Take 2 tablets (650 mg total) by mouth  every 4 (four) hours as needed for mild pain (or temp > 37.5 C (99.5 F)). 06/05/20   Angiulli, Mcarthur Rossetti, PA-C  alendronate (FOSAMAX) 70 MG tablet TAKE 1 TABLET BY MOUTH  WEEKLY AS DIRECTED Patient taking differently: Take 70 mg by mouth once a week. 03/15/19   Junie Spencer, FNP  apixaban (ELIQUIS) 5 MG TABS tablet Take 1 tablet (5 mg total) by mouth 2 (two) times daily. 06/05/20   Angiulli, Mcarthur Rossetti, PA-C  apixaban (ELIQUIS) 5 MG TABS tablet Take by mouth. 06/09/20   [provider]  Cyanocobalamin (VITAMIN B12) 1000 MCG TBCR Take 1,000 mcg by mouth daily. 06/05/20   Angiulli, Mcarthur Rossetti, PA-C  diltiazem (CARDIZEM CD) 240 MG 24 hr capsule Take 1 capsule (240 mg total) by mouth daily. 06/05/20   Angiulli, Mcarthur Rossetti, PA-C  ezetimibe (ZETIA) 10 MG tablet Take 1 tablet (10 mg total) by mouth daily. 06/05/20   Angiulli, Mcarthur Rossetti, PA-C  ipratropium (ATROVENT) 0.06 % nasal spray Place 1 spray into both nostrils 2 (two) times daily. 06/05/20   Angiulli, Mcarthur Rossetti, PA-C  Lancets [facility] ULTRASOFT) lancets Use as instructed 06/07/20   Jannifer Rodney A, FNP  lisinopril (ZESTRIL) 40 MG tablet Take by mouth. 06/09/20   [provider]  magnesium oxide (MAG-OX) 400 (241.3 Mg) MG tablet Take 1 tablet (400 mg total) by mouth daily. 06/05/20   Angiulli, Mcarthur Rossetti, PA-C  melatonin 3 MG TABS tablet Take 1 tablet (3 mg total) by mouth at bedtime. 06/05/20   Angiulli, Mcarthur Rossetti, PA-C  metFORMIN (GLUCOPHAGE) 1000 MG tablet TAKE ONE-HALF TABLET BY  MOUTH DAILY WITH BREAKFAST 06/05/20   Angiulli, Mcarthur Rossetti, PA-C  metoprolol succinate (TOPROL-XL) 100 MG 24 hr tablet Take 1 tablet (100 mg total) by mouth daily. Take with or immediately following a meal. 06/05/20   Angiulli, Mcarthur Rossetti, PA-C  saccharomyces boulardii (FLORASTOR) 250 MG capsule Take 1 capsule (250 mg total) by mouth 2 (two) times daily. 06/05/20   Angiulli, Mcarthur Rossetti, PA-C  senna-docusate (SENOKOT-S) 8.6-50 MG tablet Take 2 tablets by mouth 2 (two) times daily.  06/05/20   Angiulli, Mcarthur Rossetti, PA-C  tamsulosin (FLOMAX) 0.4 MG CAPS capsule Take 1 capsule (0.4 mg total) by mouth daily. 06/22/20   McKenzie, Mardene Celeste, MD    Allergies    Crestor [rosuvastatin], Lipitor [atorvastatin], Pravastatin, and Keflex [cephalexin]  Review of Systems   Review of Systems  Unable to perform ROS: Mental status change    Physical Exam Updated Vital Signs BP 104/68    Pulse 65    Temp 98.6 F (37 C) (Oral)    Resp (!) 26  Ht 1.626 m (5\' 4" )    Wt 61.7 kg    SpO2 98%    BMI 23.34 kg/m   Physical Exam Vitals and nursing note reviewed.  Constitutional:      General: She is not in acute distress.    Appearance: Normal appearance. She is well-developed and well-nourished.  HENT:     Head: Normocephalic and atraumatic.     Mouth/Throat:     Mouth: Mucous membranes are dry.  Eyes:     Extraocular Movements: Extraocular movements intact.     Conjunctiva/sclera: Conjunctivae normal.     Pupils: Pupils are equal, round, and reactive to light.  Cardiovascular:     Rate and Rhythm: Normal rate and regular rhythm.     Heart sounds: No murmur heard.   Pulmonary:     Effort: Pulmonary effort is normal. No respiratory distress.     Breath sounds: Normal breath sounds.  Abdominal:     Palpations: Abdomen is soft.     Tenderness: There is no abdominal tenderness.  Musculoskeletal:        General: No edema. Normal range of motion.     Cervical back: Normal range of motion and neck supple.  Skin:    General: Skin is warm and dry.  Neurological:     Mental Status: She is alert. She is disoriented.     Comments: Patient confused.  But alert.  Will follow some commands.  Moving all extremities.  Psychiatric:        Mood and Affect: Mood and affect normal.     ED Results / Procedures / Treatments   Labs (all labs ordered are listed, but only abnormal results are displayed) Labs Reviewed  URINALYSIS, ROUTINE W REFLEX MICROSCOPIC - Abnormal; Notable for the  following components:      Result Value   APPearance CLOUDY (*)    Hgb urine dipstick SMALL (*)    Ketones, ur 5 (*)    Protein, ur 100 (*)    Leukocytes,Ua LARGE (*)    WBC, UA >50 (*)    Bacteria, UA RARE (*)    Non Squamous Epithelial 0-5 (*)    All other components within normal limits  CBC WITH DIFFERENTIAL/PLATELET - Abnormal; Notable for the following components:   WBC 11.8 (*)    Hemoglobin 10.4 (*)    HCT 34.5 (*)    MCV 77.2 (*)    MCH 23.3 (*)    RDW 17.4 (*)    Neutro Abs 9.4 (*)    All other components within normal limits  COMPREHENSIVE METABOLIC PANEL - Abnormal; Notable for the following components:   Sodium 125 (*)    Chloride 94 (*)    CO2 20 (*)    Glucose, Bld 135 (*)    Total Protein 6.1 (*)    Albumin 3.2 (*)    All other components within normal limits  URINE CULTURE    EKG None  Radiology CT Head Wo Contrast  Result Date: 06/26/2020 CLINICAL DATA:  Mental status changes EXAM: CT HEAD WITHOUT CONTRAST TECHNIQUE: Contiguous axial images were obtained from the base of the skull through the vertex without intravenous contrast. COMPARISON:  MR brain 05/04/2020, CT brain 05/03/2020 FINDINGS: Brain: No evidence of acute infarction, hemorrhage, extra-axial collection, ventriculomegaly, or mass effect. Generalized cerebral atrophy. Periventricular white matter low attenuation likely secondary to microangiopathy. Vascular: Cerebrovascular atherosclerotic calcifications are noted. Skull: Negative for fracture or focal lesion. Sinuses/Orbits: Visualized portions of the orbits are unremarkable. Visualized  portions of the paranasal sinuses are unremarkable. Visualized portions of the mastoid air cells are unremarkable. Other: None. IMPRESSION: 1. No acute intracranial abnormality. 2. Chronic small vessel ischemic changes and cerebral atrophy. Electronically Signed   By: Kathreen Devoid   On: 06/26/2020 11:49   DG Chest Port 1 View  Result Date: 06/26/2020 CLINICAL  DATA:  Altered mental status. EXAM: PORTABLE CHEST 1 VIEW COMPARISON:  November 19, 21. FINDINGS: Slight decrease in bilateral pleural effusions, now small. Mild overlying bibasilar opacities, favor atelectasis. No visible pneumothorax. Similar enlarged cardiac silhouette and pulmonary vascular congestion. IMPRESSION: 1. Slight decrease in bilateral pleural effusions, now small. Mild overlying bibasilar opacities, favor atelectasis. 2. Cardiomegaly and pulmonary vascular congestion. Electronically Signed   By: Margaretha Sheffield MD   On: 06/26/2020 11:24    Procedures Procedures (including critical care time)  Medications Ordered in ED Medications - No data to display  ED Course  I have reviewed the triage vital signs and the nursing notes.  Pertinent labs & imaging results that were available during my care of the patient were reviewed by me and considered in my medical decision making (see chart for details).    MDM Rules/Calculators/A&P                          Patient's work-up CT head without acute findings.  Urinalysis does not really have a lot of bacteria but did have a lot of red cells and white cells.  The patient's had a Foley catheter in since at least December 15.  Urine culture sent again today.  Chest x-ray shows some small pleural effusions but no pulmonary edema or evidence of pneumonia.  Electrolytes significant for sodium of 125.  Patient not on a diuretic.  Patient did appear a little dehydrated.  BUN and creatinine without any significant abnormalities.  Liver function test are fine.   Discussed with hospitalist.  They feel that the sodium is low enough to have made her mental status a bit worse.  Recommend admission.  Ordered 500 cc bolus normal saline and then 75 cc an hour.  Patient has been fully vaccinated including booster.  Covid test ordered by the admitting team.  In addition there is been some concerns and questions by the family patient wants to go to nursing  facility.  Family has been having her at her home for the past 3 weeks.  They have in-house sitter.  And does also have assistance through the primary care doctor in [place] regarding some occupational therapy.  Admitting team made aware of the circumstances.  The family is more than fine with having her at home.  Patient keeps saying she wants to go to nursing facility Final Clinical Impression(s) / ED Diagnoses Final diagnoses:  None    Rx / DC Orders ED Discharge Orders    None       Fredia Sorrow, MD 06/26/20 1352
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
## 2020-06-27 DIAGNOSIS — Z8249 Family history of ischemic heart disease and other diseases of the circulatory system: Secondary | ICD-10-CM | POA: Diagnosis not present

## 2020-06-27 DIAGNOSIS — E871 Hypo-osmolality and hyponatremia: Secondary | ICD-10-CM | POA: Diagnosis not present

## 2020-06-27 DIAGNOSIS — Z8261 Family history of arthritis: Secondary | ICD-10-CM | POA: Diagnosis not present

## 2020-06-27 DIAGNOSIS — E785 Hyperlipidemia, unspecified: Secondary | ICD-10-CM | POA: Diagnosis present

## 2020-06-27 DIAGNOSIS — Z791 Long term (current) use of non-steroidal anti-inflammatories (NSAID): Secondary | ICD-10-CM | POA: Diagnosis not present

## 2020-06-27 DIAGNOSIS — Z20822 Contact with and (suspected) exposure to covid-19: Secondary | ICD-10-CM | POA: Diagnosis present

## 2020-06-27 DIAGNOSIS — Z888 Allergy status to other drugs, medicaments and biological substances status: Secondary | ICD-10-CM | POA: Diagnosis not present

## 2020-06-27 DIAGNOSIS — I69319 Unspecified symptoms and signs involving cognitive functions following cerebral infarction: Secondary | ICD-10-CM | POA: Diagnosis not present

## 2020-06-27 DIAGNOSIS — G9341 Metabolic encephalopathy: Secondary | ICD-10-CM | POA: Diagnosis not present

## 2020-06-27 DIAGNOSIS — E860 Dehydration: Secondary | ICD-10-CM | POA: Diagnosis present

## 2020-06-27 DIAGNOSIS — E039 Hypothyroidism, unspecified: Secondary | ICD-10-CM | POA: Diagnosis present

## 2020-06-27 DIAGNOSIS — Z79899 Other long term (current) drug therapy: Secondary | ICD-10-CM | POA: Diagnosis not present

## 2020-06-27 DIAGNOSIS — Z7984 Long term (current) use of oral hypoglycemic drugs: Secondary | ICD-10-CM | POA: Diagnosis not present

## 2020-06-27 DIAGNOSIS — I480 Paroxysmal atrial fibrillation: Secondary | ICD-10-CM | POA: Diagnosis present

## 2020-06-27 DIAGNOSIS — I10 Essential (primary) hypertension: Secondary | ICD-10-CM | POA: Diagnosis present

## 2020-06-27 DIAGNOSIS — I69320 Aphasia following cerebral infarction: Secondary | ICD-10-CM | POA: Diagnosis not present

## 2020-06-27 DIAGNOSIS — Z87440 Personal history of urinary (tract) infections: Secondary | ICD-10-CM | POA: Diagnosis not present

## 2020-06-27 DIAGNOSIS — Z8262 Family history of osteoporosis: Secondary | ICD-10-CM | POA: Diagnosis not present

## 2020-06-27 DIAGNOSIS — F0391 Unspecified dementia with behavioral disturbance: Secondary | ICD-10-CM | POA: Diagnosis present

## 2020-06-27 DIAGNOSIS — E119 Type 2 diabetes mellitus without complications: Secondary | ICD-10-CM | POA: Diagnosis present

## 2020-06-27 DIAGNOSIS — Z7901 Long term (current) use of anticoagulants: Secondary | ICD-10-CM | POA: Diagnosis not present

## 2020-06-27 DIAGNOSIS — Z820 Family history of epilepsy and other diseases of the nervous system: Secondary | ICD-10-CM | POA: Diagnosis not present

## 2020-06-27 DIAGNOSIS — M810 Age-related osteoporosis without current pathological fracture: Secondary | ICD-10-CM | POA: Diagnosis present

## 2020-06-27 DIAGNOSIS — M170 Bilateral primary osteoarthritis of knee: Secondary | ICD-10-CM | POA: Diagnosis present

## 2020-06-27 LAB — CBC
HCT: 30.3 % — ABNORMAL LOW (ref 36.0–46.0)
Hemoglobin: 9.1 g/dL — ABNORMAL LOW (ref 12.0–15.0)
MCH: 23 pg — ABNORMAL LOW (ref 26.0–34.0)
MCHC: 30 g/dL (ref 30.0–36.0)
MCV: 76.7 fL — ABNORMAL LOW (ref 80.0–100.0)
Platelets: 316 10*3/uL (ref 150–400)
RBC: 3.95 MIL/uL (ref 3.87–5.11)
RDW: 17.2 % — ABNORMAL HIGH (ref 11.5–15.5)
WBC: 8.8 10*3/uL (ref 4.0–10.5)
nRBC: 0 % (ref 0.0–0.2)

## 2020-06-27 LAB — MAGNESIUM: Magnesium: 1.9 mg/dL (ref 1.7–2.4)

## 2020-06-27 LAB — GLUCOSE, CAPILLARY
Glucose-Capillary: 127 mg/dL — ABNORMAL HIGH (ref 70–99)
Glucose-Capillary: 136 mg/dL — ABNORMAL HIGH (ref 70–99)
Glucose-Capillary: 122 mg/dL — ABNORMAL HIGH (ref 70–99)
Glucose-Capillary: 90 mg/dL (ref 70–99)

## 2020-06-27 LAB — BASIC METABOLIC PANEL
Anion gap: 9 (ref 5–15)
BUN: 16 mg/dL (ref 8–23)
CO2: 20 mmol/L — ABNORMAL LOW (ref 22–32)
Calcium: 8.7 mg/dL — ABNORMAL LOW (ref 8.9–10.3)
Chloride: 95 mmol/L — ABNORMAL LOW (ref 98–111)
Creatinine, Ser: 0.59 mg/dL (ref 0.44–1.00)
GFR, Estimated: >60 mL/min (ref >60)
Glucose, Bld: 118 mg/dL — ABNORMAL HIGH (ref 70–99)
Potassium: 4.3 mmol/L (ref 3.5–5.1)
Sodium: 124 mmol/L — ABNORMAL LOW (ref 135–145)

## 2020-06-27 LAB — URINE CULTURE

## 2020-06-27 MED ORDER — METOPROLOL SUCCINATE ER 25 MG PO TB24
25.0000 mg | ORAL_TABLET | Freq: Every day | ORAL | Status: DC
  Administered 2020-06-28: 09:00:00 25 mg via ORAL
  Filled 2020-06-27: qty 1

## 2020-06-27 MED ORDER — DILTIAZEM HCL ER COATED BEADS 120 MG PO CP24
120.0000 mg | ORAL_CAPSULE | Freq: Every day | ORAL | Status: DC
  Filled 2020-06-27: qty 1

## 2020-06-27 MED ORDER — CHLORHEXIDINE GLUCONATE CLOTH 2 % EX PADS
6.0000 | MEDICATED_PAD | Freq: Every day | CUTANEOUS | Status: DC
  Administered 2020-06-27 – 2020-06-28 (×2): 6 via TOPICAL

## 2020-06-27 NOTE — TOC Progression Note (Signed)
Transition of Care [facility]) - Progression Note    Patient Details  Name: Sheila Ray MRN: [id number] Date of Birth: 1941/04/05  [facility] CM/SW Contact  Leitha Bleak, RN Phone Number: 06/27/2020, 4:37 PM  Clinical Narrative:   PT is recommending SNF. [name] in in agreement. He is requesting [facility] or surrounding area. Patient referred to Jerene Dilling, family is asking about LTC and medicaid. TOC to follow.    Expected Discharge Plan: Skilled Nursing Facility Barriers to Discharge: Continued Medical Work up  Expected Discharge Plan and Services Expected Discharge Plan: Skilled Nursing Facility In-house Referral: Clinical Social Work Discharge Planning Services: CM Consult Post Acute Care Choice: Skilled Nursing Facility Living arrangements for the past 2 months: Single Family Home                 DME Arranged: N/A DME Agency: NA       HH Arranged: NA HH Agency: NA     Readmission Risk Interventions Readmission Risk Prevention Plan 06/27/2020  Transportation Screening Complete  HRI or Home Care Consult Complete  Social Work Consult for Recovery Care Planning/Counseling Complete  Palliative Care Screening Not Applicable  Medication Review [employer]) Complete  Some recent data might be hidden

## 2020-06-27 NOTE — Plan of Care (Signed)
  Problem: Acute Rehab PT Goals(only PT should resolve) Goal: Pt Will Go Supine/Side To Sit Outcome: Progressing Flowsheets (Taken 06/27/2020 1018) Pt will go Supine/Side to Sit: with min guard assist Goal: Pt Will Go Sit To Supine/Side Outcome: Progressing Flowsheets (Taken 06/27/2020 1018) Pt will go Sit to Supine/Side: with min guard assist Goal: Patient Will Transfer Sit To/From Stand Outcome: Progressing Flowsheets (Taken 06/27/2020 1018) Patient will transfer sit to/from stand: with min guard assist Goal: Pt Will Transfer Bed To Chair/Chair To Bed Outcome: Progressing Flowsheets (Taken 06/27/2020 1018) Pt will Transfer Bed to Chair/Chair to Bed: with min assist Goal: Pt Will Ambulate Outcome: Progressing Flowsheets (Taken 06/27/2020 1018) Pt will Ambulate:  25 feet  with minimal assist  with rolling walker   Tori Tahjai Schetter PT, DPT 06/27/20, 10:19 AM [phone]

## 2020-06-27 NOTE — Progress Notes (Signed)
Patient Demographics:    Sheila Ray, is a 79 y.o. female, DOB - [birth year], BJY:[id number]  Admit date - 06/26/2020   Admitting Physician Kelse Ploch Mariea Clonts, MD  Outpatient Primary MD for the patient is Junie Spencer, FNP  LOS - 0   Chief Complaint  Patient presents with  . Altered Mental Status        Subjective:    Sheila Ray today has no fevers, no emesis,  No chest pain,   Fatigue and generalized weakness noted, no vomiting or diarrhea  Assessment  & Plan :    Active Problems:   Hyponatremia   Acute metabolic encephalopathy   Brief Summary:-  79 y.o. female with medical history significant for prior left thalamic CVA, atrial fibrillation on Eliquis, type 2 diabetes, hypertension, dyslipidemia, hypothyroidism, and possible dementia admitted on 06/26/20 with acute metabolic cephalopathy secondary to presumed UTI and the patient with chronic indwelling Foley   A/p 1) acute metabolic cephalopathy secondary to acute hyponatremia -Na remains low at 124 from her usual baseline around 133 to 135 -Continue gentle normal saline hydration -Avoid excessive free water intake  2) chronic atrial fibrillation-continue metoprolol and Cardizem for rate control and Eliquis for secondary stroke prophylaxis  3) chronic anemia--hemoglobin is 9.1 which is close to patient's usual baseline--no bleeding concerns, continue to monitor all aspects some drop in H&H with hemodilution from IV hydration for #1 above  4)DM2-recent A1c 6.4, reflecting excellent diabetic control PTA -Metformin on hold -Use Novolog/Humalog Sliding scale insulin with Accu-Cheks/Fingersticks as ordered   5)History of chronic Foley catheter with recent UTI Recently treated with ciprofloxacin per urology -Appears to have chronic pyuria related to Foley catheter insertion -Continue Flomax -Urine culture at this time with mixed  flora--no indication for further antibiotics at this time -Patient has a follow-up appointment coming up with Dr. Ronne Binning  6) dementia with cognitive/memory deficits and behavioral disturbance--okay to use Seroquel nightly and Haldol as needed -TSH is 6.5, check folate and B12  7)HTN--blood pressure has been soft, continue to hold lisinopril  8)Prior left thalamic CVA/dyslipidemia----apparently intolerant to statins -Continue on Zetia and Eliquis   Disposition/Need for in-Hospital Stay- patient unable to be discharged at this time due to persistent hyponatremia, symptomatic with metabolic encephalopathy requiring IV fluids  Status is: Inpatient  Remains inpatient appropriate because:Please see above   Disposition: The patient is from: Home              Anticipated d/c is to: Home              Anticipated d/c date is: 2 days              Patient currently is not medically stable to d/c. Barriers: Not Clinically Stable-   Code Status :  -  Code Status: Full Code   Family Communication: None at bedside  Consults  :  na  DVT Prophylaxis  :   - SCDs  apixaban (ELIQUIS) tablet 5 mg    Lab Results  Component Value Date   PLT 316 06/27/2020    Inpatient Medications  Scheduled Meds: . apixaban  5 mg Oral BID  . Chlorhexidine Gluconate Cloth  6 each Topical Daily  . [START ON 06/28/2020] diltiazem  120 mg  Oral Daily  . ezetimibe  10 mg Oral Daily  . insulin aspart  0-15 Units Subcutaneous TID WC  . insulin aspart  0-5 Units Subcutaneous QHS  . ipratropium  1 spray Each Nare BID  . magnesium oxide  400 mg Oral Daily  . melatonin  3 mg Oral QHS  . [START ON 06/28/2020] metoprolol succinate  25 mg Oral Daily  . QUEtiapine  25 mg Oral QHS  . saccharomyces boulardii  250 mg Oral BID  . senna-docusate  2 tablet Oral BID  . tamsulosin  0.4 mg Oral Daily  . vitamin B-12  1,000 mcg Oral Daily   Continuous Infusions: . sodium chloride 75 mL/hr at 06/26/20 1330   PRN  Meds:.acetaminophen **OR** acetaminophen, acetaminophen, haloperidol lactate, ondansetron **OR** ondansetron (ZOFRAN) IV    Anti-infectives (From admission, onward)   None        Objective:   Vitals:   06/27/20 0530 06/27/20 0532 06/27/20 1007 06/27/20 1414  BP: (!) 99/55 (!) 91/57 101/66 115/76  Pulse: (!) 50 (!) 55 62 72  Resp: 16  18 18   Temp: (!) 97.4 F (36.3 C)  97.7 F (36.5 C) 97.8 F (36.6 C)  TempSrc: Oral  Oral Oral  SpO2: 92%  97% 96%  Weight:      Height:        Wt Readings from Last 3 Encounters:  06/26/20 76.2 kg  06/22/20 61.7 kg  06/14/20 61.5 kg     Intake/Output Summary (Last 24 hours) at 06/27/2020 1926 Last data filed at 06/27/2020 1300 Gross per 24 hour  Intake 2287.5 ml  Output 250 ml  Net 2037.5 ml    Physical Exam  Gen:- Awake Alert, more interactive today HEENT:- [place].AT, No sclera icterus Neck-Supple Neck,No JVD,.  Lungs-  CTAB , fair symmetrical air movement CV- S1, S2 normal, irregular  Abd-  +ve B.Sounds, Abd Soft, No tenderness,    Extremity/Skin:- No  edema, pedal pulses present  Psych-affect is flat, some baseline cognitive and memory deficits noted,  Neuro-generalized weakness, no new focal deficits, no tremors   Data Review:   Micro Results Recent Results (from the past 240 hour(s))  Urine Culture     Status: Abnormal   Collection Time: 06/26/20 10:12 AM   Specimen: Urine, Clean Catch  Result Value Ref Range Status   Specimen Description   Final    URINE, CLEAN CATCH Performed at [facility], [address] [place], [place] [address]    Special Requests   Final    NONE Performed at [facility], [address], [place], [place] [address]    Culture MULTIPLE SPECIES PRESENT, SUGGEST RECOLLECTION (A)  Final   Report Status 06/27/2020 FINAL  Final  Resp Panel by RT-PCR (Flu A&B, Covid) Nasopharyngeal Swab     Status: None   Collection Time: 06/26/20  1:45 PM   Specimen: Nasopharyngeal Swab; Nasopharyngeal(NP) swabs  in vial transport medium  Result Value Ref Range Status   SARS Coronavirus 2 by RT PCR NEGATIVE NEGATIVE Final    Comment: (NOTE) SARS-CoV-2 target nucleic acids are NOT DETECTED.  The SARS-CoV-2 RNA is generally detectable in upper respiratory specimens during the acute phase of infection. The lowest concentration of SARS-CoV-2 viral copies this assay can detect is 138 copies/mL. A negative result does not preclude SARS-Cov-2 infection and should not be used as the sole basis for treatment or other patient management decisions. A negative result may occur with  improper specimen collection/handling, submission of specimen  other than nasopharyngeal swab, presence of viral mutation(s) within the areas targeted by this assay, and inadequate number of viral copies(<138 copies/mL). A negative result must be combined with clinical observations, patient history, and epidemiological information. The expected result is Negative.  Fact Sheet for Patients:  BloggerCourse.com  Fact Sheet for Healthcare Providers:  SeriousBroker.it  This test is no t yet approved or cleared by the [place] FDA and  has been authorized for detection and/or diagnosis of SARS-CoV-2 by FDA under an Emergency Use Authorization (EUA). This EUA will remain  in effect (meaning this test can be used) for the duration of the COVID-19 declaration under Section 564(b)(1) of the Act, 21 U.S.C.section 360bbb-3(b)(1), unless the authorization is terminated  or revoked sooner.       Influenza A by PCR NEGATIVE NEGATIVE Final   Influenza B by PCR NEGATIVE NEGATIVE Final    Comment: (NOTE) The Xpert Xpress SARS-CoV-2/FLU/RSV plus assay is intended as an aid in the diagnosis of influenza from Nasopharyngeal swab specimens and should not be used as a sole basis for treatment. Nasal washings and aspirates are unacceptable for Xpert Xpress  SARS-CoV-2/FLU/RSV testing.  Fact Sheet for Patients: BloggerCourse.com  Fact Sheet for Healthcare Providers: SeriousBroker.it  This test is not yet approved or cleared by the [place] FDA and has been authorized for detection and/or diagnosis of SARS-CoV-2 by FDA under an Emergency Use Authorization (EUA). This EUA will remain in effect (meaning this test can be used) for the duration of the COVID-19 declaration under Section 564(b)(1) of the Act, 21 U.S.C. section 360bbb-3(b)(1), unless the authorization is terminated or revoked.  Performed at [facility], [address] [place], [place] [address]     Radiology Reports CT Head Wo Contrast  Result Date: 06/26/2020 CLINICAL DATA:  Mental status changes EXAM: CT HEAD WITHOUT CONTRAST TECHNIQUE: Contiguous axial images were obtained from the base of the skull through the vertex without intravenous contrast. COMPARISON:  MR brain 05/04/2020, CT brain 05/03/2020 FINDINGS: Brain: No evidence of acute infarction, hemorrhage, extra-axial collection, ventriculomegaly, or mass effect. Generalized cerebral atrophy. Periventricular white matter low attenuation likely secondary to microangiopathy. Vascular: Cerebrovascular atherosclerotic calcifications are noted. Skull: Negative for fracture or focal lesion. Sinuses/Orbits: Visualized portions of the orbits are unremarkable. Visualized portions of the paranasal sinuses are unremarkable. Visualized portions of the mastoid air cells are unremarkable. Other: None. IMPRESSION: 1. No acute intracranial abnormality. 2. Chronic small vessel ischemic changes and cerebral atrophy. Electronically Signed   By: Elige Ko   On: 06/26/2020 11:49   CT ABDOMEN PELVIS W CONTRAST  Result Date: 06/15/2020 CLINICAL DATA:  Lower abdominal pain EXAM: CT ABDOMEN AND PELVIS WITH CONTRAST TECHNIQUE: Multidetector CT imaging of the abdomen and pelvis was performed  using the standard protocol following bolus administration of intravenous contrast. CONTRAST:  OMNIPAQUE IOHEXOL 300 MG/ML  SOLN COMPARISON:  None. FINDINGS: Lower chest: Small bilateral pleural effusions. Heart is mildly enlarged. Hepatobiliary: 2 cm low-density lesion in the left hepatic lobe, likely cyst. Gallbladder unremarkable. Pancreas: No focal abnormality or ductal dilatation. Spleen: No focal abnormality.  Normal size. Adrenals/Urinary Tract: 14 mm nodule in the left adrenal gland. Right adrenal gland unremarkable. Small cyst off the lower pole of the left kidney. No hydronephrosis. Urinary bladder decompressed with Foley catheter in place. Stomach/Bowel: Stomach, large and small bowel grossly unremarkable. Vascular/Lymphatic: Aortic atherosclerosis. No evidence of aneurysm or adenopathy. Reproductive: Prior hysterectomy.  No adnexal masses. Other: No free fluid or free air. Musculoskeletal: No  acute bony abnormality. IMPRESSION: Foley catheter in place. Urinary bladder decompressed. No hydronephrosis. Small bilateral pleural effusions. No acute findings in the abdomen or pelvis. Electronically Signed   By: Charlett Nose M.D.   On: 06/15/2020 02:00   DG Chest Port 1 View  Result Date: 06/26/2020 CLINICAL DATA:  Altered mental status. EXAM: PORTABLE CHEST 1 VIEW COMPARISON:  November 19, 21. FINDINGS: Slight decrease in bilateral pleural effusions, now small. Mild overlying bibasilar opacities, favor atelectasis. No visible pneumothorax. Similar enlarged cardiac silhouette and pulmonary vascular congestion. IMPRESSION: 1. Slight decrease in bilateral pleural effusions, now small. Mild overlying bibasilar opacities, favor atelectasis. 2. Cardiomegaly and pulmonary vascular congestion. Electronically Signed   By: Feliberto Harts MD   On: 06/26/2020 11:24     CBC Recent Labs  Lab 06/26/20 1123 06/27/20 0514  WBC 11.8* 8.8  HGB 10.4* 9.1*  HCT 34.5* 30.3*  PLT 354 316  MCV 77.2* 76.7*   MCH 23.3* 23.0*  MCHC 30.1 30.0  RDW 17.4* 17.2*  LYMPHSABS 1.4  --   MONOABS 0.9  --   EOSABS 0.1  --   BASOSABS 0.1  --     Chemistries  Recent Labs  Lab 06/26/20 1123 06/27/20 0514  NA 125* 124*  K 4.3 4.3  CL 94* 95*  CO2 20* 20*  GLUCOSE 135* 118*  BUN 16 16  CREATININE 0.61 0.59  CALCIUM 9.0 8.7*  MG  --  1.9  AST 18  --   ALT 18  --   ALKPHOS 87  --   BILITOT 0.5  --    ------------------------------------------------------------------------------------------------------------------ No results for input(s): CHOL, HDL, LDLCALC, TRIG, CHOLHDL, LDLDIRECT in the last 72 hours.  Lab Results  Component Value Date   HGBA1C 6.4 (H) 05/04/2020   ------------------------------------------------------------------------------------------------------------------ Recent Labs    06/26/20 1123  TSH 6.501*   ------------------------------------------------------------------------------------------------------------------ No results for input(s): VITAMINB12, FOLATE, FERRITIN, TIBC, IRON, RETICCTPCT in the last 72 hours.  Coagulation profile No results for input(s): INR, PROTIME in the last 168 hours.  No results for input(s): DDIMER in the last 72 hours.  Cardiac Enzymes No results for input(s): CKMB, TROPONINI, MYOGLOBIN in the last 168 hours.  Invalid input(s): CK ------------------------------------------------------------------------------------------------------------------    Component Value Date/Time   BNP 207.3 (H) 12/28/2019 1136   Sheila Ray M.D on 06/27/2020 at 7:26 PM  Go to [email or web address] - for contact info  [facility] - Office  [phone]

## 2020-06-27 NOTE — NC FL2 (Signed)
[place] LEVEL OF CARE SCREENING TOOL     IDENTIFICATION  Patient Name: Sheila Ray Birthdate: 18-Feb-1941 Sex: female Admission Date (Current Location): 06/26/2020  [facility] and [place] Number:  [employer] and Address:  [place] [address] [place]      Provider Number: [id number]  Attending Physician Name and Address:  Roxan Hockey, MD  Relative Name and Phone Number:  Elta Guadeloupe [phone]    Current Level of Care: Hospital Recommended Level of Care: [place] Prior Approval Number:    Date Approved/Denied:   PASRR Number: [id number]  Discharge Plan: SNF    Current Diagnoses: Patient Active Problem List   Diagnosis Date Noted  . Acute metabolic encephalopathy [id number]  . Urinary retention 06/22/2020  . Hyperglycemia   . Chronic diastolic congestive heart failure ([place])   . History of hypertension   . Neurogenic bladder   . Acute lower UTI   . Essential hypertension   . Hyponatremia   . Slow transit constipation   . Hypokalemia   . Controlled type 2 diabetes mellitus with hyperglycemia, without long-term current use of insulin ([place])   . Left middle cerebral artery stroke ([place]) 05/15/2020  . Acute ischemic stroke ([place]) 05/03/2020  . 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 Hypoxia   . Encephalopathy acute   . Atrial fibrillation ([place]) 08/19/2016  . OA (osteoarthritis) of knee 07/25/2014  . Hypothyroid 03/04/2013  . Stroke ([place]) 01/15/2013  . Osteoporosis 12/02/2012  . DM (diabetes mellitus) ([place]) 10/22/2012  . Hypertension associated with diabetes ([place]) 10/22/2012  . Hyperlipidemia associated with type 2 diabetes mellitus ([place]) 10/22/2012  . Vitamin D deficiency 09/13/2012  . Obesity (BMI 30-39.9) 09/13/2012    Orientation RESPIRATION BLADDER Height & Weight     Self,Time,Situation  Normal Continent,External catheter Weight: 76.2 kg Height:  5\' 4"  (162.6 cm)   BEHAVIORAL SYMPTOMS/MOOD NEUROLOGICAL BOWEL NUTRITION STATUS      Continent Diet (See DC summary)  AMBULATORY STATUS COMMUNICATION OF NEEDS Skin   Extensive Assist Verbally Normal                       Personal Care Assistance Level of Assistance  Bathing,Feeding,Dressing Bathing Assistance: Maximum assistance Feeding assistance: Limited assistance Dressing Assistance: Maximum assistance     Functional Limitations Info  Sight,Hearing,Speech Sight Info: Adequate Hearing Info: Impaired Speech Info: Adequate    SPECIAL CARE FACTORS FREQUENCY  PT (By licensed PT)     PT Frequency: 5 x a week              Contractures Contractures Info: Not present    Additional Factors Info  Code Status,Allergies Code Status Info: Full Allergies Info: Crestor, Lipitor, Pravastatin, keflex           Current Medications (06/27/2020):  This is the current hospital active medication list Current Facility-Administered Medications  Medication Dose Route Frequency Provider Last Rate Last Admin  . 0.9 %  sodium chloride infusion   Intravenous Continuous Heath Lark D, DO 75 mL/hr at 06/26/20 1330 New Bag at 06/26/20 1330  . acetaminophen (TYLENOL) tablet 650 mg  650 mg Oral Q6H PRN Manuella Ghazi, Pratik D, DO       Or  . acetaminophen (TYLENOL) suppository 650 mg  650 mg Rectal Q6H PRN Manuella Ghazi, Pratik D, DO      . acetaminophen (TYLENOL) tablet 650 mg  650 mg Oral Q4H PRN Manuella Ghazi, Pratik D, DO      .  apixaban (ELIQUIS) tablet 5 mg  5 mg Oral BID Sherryll Burger, Pratik D, DO   5 mg at 06/27/20 0841  . Chlorhexidine Gluconate Cloth 2 % PADS 6 each  6 each Topical Daily Shon Hale, MD   6 each at 06/27/20 [phone]  . [START ON 06/28/2020] diltiazem (CARDIZEM CD) 24 hr capsule 120 mg  120 mg Oral Daily Emokpae, Courage, MD      . ezetimibe (ZETIA) tablet 10 mg  10 mg Oral Daily Sherryll Burger, Pratik D, DO   10 mg at 06/27/20 0841  . haloperidol lactate (HALDOL) injection 2 mg  2 mg Intravenous Q6H PRN Sherryll Burger, Pratik D, DO       . insulin aspart (novoLOG) injection 0-15 Units  0-15 Units Subcutaneous TID WC Shah, Pratik D, DO   2 Units at 06/27/20 0840  . insulin aspart (novoLOG) injection 0-5 Units  0-5 Units Subcutaneous QHS Shah, Pratik D, DO      . ipratropium (ATROVENT) 0.03 % nasal spray 1 spray  1 spray Each Nare BID Sherryll Burger, Pratik D, DO      . magnesium oxide (MAG-OX) tablet 400 mg  400 mg Oral Daily Sherryll Burger, Pratik D, DO   400 mg at 06/27/20 0842  . melatonin tablet 3 mg  3 mg Oral QHS Shah, Pratik D, DO   3 mg at 06/26/20 2235  . [START ON 06/28/2020] metoprolol succinate (TOPROL-XL) 24 hr tablet 25 mg  25 mg Oral Daily Emokpae, Courage, MD      . ondansetron (ZOFRAN) tablet 4 mg  4 mg Oral Q6H PRN Sherryll Burger, Pratik D, DO       Or  . ondansetron (ZOFRAN) injection 4 mg  4 mg Intravenous Q6H PRN Sherryll Burger, Pratik D, DO      . QUEtiapine (SEROQUEL) tablet 25 mg  25 mg Oral QHS Shah, Pratik D, DO   25 mg at 06/26/20 2236  . saccharomyces boulardii (FLORASTOR) capsule 250 mg  250 mg Oral BID Maurilio Lovely D, DO   250 mg at 06/27/20 0841  . senna-docusate (Senokot-S) tablet 2 tablet  2 tablet Oral BID Maurilio Lovely D, DO   2 tablet at 06/27/20 [phone]  . tamsulosin (FLOMAX) capsule 0.4 mg  0.4 mg Oral Daily Sherryll Burger, Pratik D, DO   0.4 mg at 06/27/20 0841  . vitamin B-12 (CYANOCOBALAMIN) tablet 1,000 mcg  1,000 mcg Oral Daily Maurilio Lovely D, DO   1,000 mcg at 06/27/20 4696     Discharge Medications: Please see discharge summary for a list of discharge medications.  Relevant Imaging Results:  Relevant Lab Results:   Additional Information SS3  [id number]  Leitha Bleak, RN

## 2020-06-27 NOTE — Evaluation (Signed)
Physical Therapy Evaluation Patient Details Name: Sheila Ray MRN: [id number] DOB: 1941/06/18 Today's Date: 06/27/2020   History of Present Illness  79 y.o. female with PMH significant for prior L thalamic CVA, a fib, type 2 diabetes, HTN, and possible dementia with c/o worsening confusion noted since [place].  Clinical Impression  Pt admitted with above diagnosis. Pt able to state name, DOB and location appropriately, but unsure why she is in the hospital and states date is 07/08/19. Pt pleasantly confused, appears lethargic preferring to keep eyes closed, but able to open on command and engage in limited conversation with therapist. Pt cued for MMT, but difficulty following resistance commands possibly due to fatigue. Pt able to rise with mod assist to power up from chair with single hand on RW and other hand remains on chair armrest, difficulty cueing for upright posture and bil hand hold on RW due to fatigue and pt returns to sitting after ~10 seconds of flexed forward standing. No family in room during eval, PLOF obtained from chart review. Pt currently with functional limitations due to the deficits listed below (see PT Problem List). Pt will benefit from skilled PT to increase their independence and safety with mobility to allow discharge to the venue listed below.       Follow Up Recommendations SNF    Equipment Recommendations  Other (comment) (TBD)    Recommendations for Other Services OT consult;Speech consult     Precautions / Restrictions Precautions Precautions: Fall Restrictions Weight Bearing Restrictions: No      Mobility  Bed Mobility  General bed mobility comments: in chair upon arrival    Transfers Overall transfer level: Needs assistance Equipment used: Rolling walker (2 wheeled) Transfers: Sit to/from Stand Sit to Stand: Mod assist    General transfer comment: cues for hand placement on chair to rise, mod assist to rise, achieves bil knee extension  but maintains trunk flexed with L hand on RW and R hand on arm rest and unable to cue with multimodal cues for upright standing posture using RW, pt sits after ~10 sec stating "I'm tired"  Ambulation/Gait  General Gait Details: not attempted  Stairs            Wheelchair Mobility    Modified Rankin (Stroke Patients Only)       Balance Overall balance assessment: Needs assistance  Standing balance support: During functional activity;Bilateral upper extremity supported Standing balance-Leahy Scale: Poor Standing balance comment: mod A with UE support to maintain static standing for ~10 sec              Pertinent Vitals/Pain Pain Assessment: Faces Faces Pain Scale: No hurt    Home Living Family/patient expects to be discharged to:: Skilled nursing facility Living Arrangements:  (Per chart review, pt living with son and daughter in law since 12/7, has people in the home to assist 7 days per week, unsure of home layout and pt unable to verbalize; no family in room during eval.)         Prior Function           Comments: No family in room and pt unable to verbalize PLOF, per chart review pt living with son and daughter in law with people in home 8-5 on weekdays and 8 hr shifts on Sat and Sun. Pt unable to state assistance level required. Pt does states she "quit therapy" when asked about home health, but unable to elaborate.     Hand Dominance   Dominant  Hand: Right    Extremity/Trunk Assessment   Upper Extremity Assessment Upper Extremity Assessment: Generalized weakness    Lower Extremity Assessment Lower Extremity Assessment: Generalized weakness;Difficult to assess due to impaired cognition (knee AROM WNL, unable to cue ankle or hip AROM; unable to MMT due to cognition, but functionally 3+/5 per ability to stand; denies numbness/tingling throughout BLE)    Cervical / Trunk Assessment Cervical / Trunk Assessment: Kyphotic  Communication      Cognition  Arousal/Alertness: Lethargic Behavior During Therapy: Flat affect Overall Cognitive Status: No family/caregiver present to determine baseline cognitive functioning  General Comments: Pt appears lethargic, states she is "very tired", prefers to keep eyes closed but able to open them on command. Pt pleasant, oriented to self, "[facility]", and birthday. Pt states date is July 08, 2019 and unsure why she is in hospital.      General Comments      Exercises     Assessment/Plan    PT Assessment Patient needs continued PT services  PT Problem List Decreased strength;Decreased activity tolerance;Decreased balance;Decreased mobility;Decreased coordination;Decreased cognition;Decreased knowledge of use of DME;Decreased safety awareness       PT Treatment Interventions DME instruction;Gait training;Functional mobility training;Therapeutic activities;Therapeutic exercise;Balance training;Neuromuscular re-education;Cognitive remediation;Patient/family education    PT Goals (Current goals can be found in the Care Plan section)  Acute Rehab PT Goals Patient Stated Goal: none stated PT Goal Formulation: With patient Time For Goal Achievement: 07/11/20 Potential to Achieve Goals: Fair    Frequency Min 2X/week   Barriers to discharge        Co-evaluation               AM-PAC PT "6 Clicks" Mobility  Outcome Measure Help needed turning from your back to your side while in a flat bed without using bedrails?: A Lot Help needed moving from lying on your back to sitting on the side of a flat bed without using bedrails?: A Lot Help needed moving to and from a bed to a chair (including a wheelchair)?: A Lot Help needed standing up from a chair using your arms (e.g., wheelchair or bedside chair)?: A Lot Help needed to walk in hospital room?: Total Help needed climbing 3-5 steps with a railing? : Total 6 Click Score: 10    End of Session Equipment Utilized During Treatment: Gait  belt Activity Tolerance: Patient limited by lethargy Patient left: in chair;with call bell/phone within reach;with chair alarm set Nurse Communication: Mobility status PT Visit Diagnosis: Unsteadiness on feet (R26.81);Other abnormalities of gait and mobility (R26.89);Muscle weakness (generalized) (M62.81)    Time: 4401-0272 PT Time Calculation (min) (ACUTE ONLY): 10 min   Charges:   PT Evaluation $PT Eval Moderate Complexity: 1 Mod           Tori Jacqulin Brandenburger PT, DPT 06/27/20, 10:13 AM [phone]

## 2020-06-27 NOTE — Plan of Care (Signed)

## 2020-06-28 ENCOUNTER — Telehealth: Payer: Self-pay

## 2020-06-28 ENCOUNTER — Inpatient Hospital Stay
Admission: RE | Admit: 2020-06-28 | Discharge: 2020-08-28 | Disposition: A | Discharge status: Skilled Nursing Facility | Payer: Medicare Other | Source: Ambulatory Visit | Attending: Internal Medicine | Admitting: Internal Medicine

## 2020-06-28 DIAGNOSIS — I482 Chronic atrial fibrillation, unspecified: Secondary | ICD-10-CM | POA: Diagnosis not present

## 2020-06-28 DIAGNOSIS — R339 Retention of urine, unspecified: Secondary | ICD-10-CM | POA: Diagnosis not present

## 2020-06-28 DIAGNOSIS — R262 Difficulty in walking, not elsewhere classified: Secondary | ICD-10-CM | POA: Diagnosis not present

## 2020-06-28 DIAGNOSIS — I480 Paroxysmal atrial fibrillation: Secondary | ICD-10-CM | POA: Diagnosis not present

## 2020-06-28 DIAGNOSIS — E1149 Type 2 diabetes mellitus with other diabetic neurological complication: Secondary | ICD-10-CM | POA: Diagnosis not present

## 2020-06-28 DIAGNOSIS — I10 Essential (primary) hypertension: Secondary | ICD-10-CM | POA: Diagnosis not present

## 2020-06-28 DIAGNOSIS — F0390 Unspecified dementia without behavioral disturbance: Secondary | ICD-10-CM | POA: Diagnosis not present

## 2020-06-28 DIAGNOSIS — F0151 Vascular dementia with behavioral disturbance: Secondary | ICD-10-CM | POA: Diagnosis not present

## 2020-06-28 DIAGNOSIS — F419 Anxiety disorder, unspecified: Secondary | ICD-10-CM | POA: Diagnosis not present

## 2020-06-28 DIAGNOSIS — K5901 Slow transit constipation: Secondary | ICD-10-CM | POA: Diagnosis not present

## 2020-06-28 DIAGNOSIS — I5032 Chronic diastolic (congestive) heart failure: Secondary | ICD-10-CM | POA: Diagnosis not present

## 2020-06-28 DIAGNOSIS — M810 Age-related osteoporosis without current pathological fracture: Secondary | ICD-10-CM | POA: Diagnosis not present

## 2020-06-28 DIAGNOSIS — I639 Cerebral infarction, unspecified: Secondary | ICD-10-CM | POA: Diagnosis not present

## 2020-06-28 DIAGNOSIS — E871 Hypo-osmolality and hyponatremia: Secondary | ICD-10-CM | POA: Diagnosis not present

## 2020-06-28 DIAGNOSIS — E1159 Type 2 diabetes mellitus with other circulatory complications: Secondary | ICD-10-CM | POA: Diagnosis not present

## 2020-06-28 DIAGNOSIS — R52 Pain, unspecified: Principal | ICD-10-CM

## 2020-06-28 DIAGNOSIS — Z978 Presence of other specified devices: Secondary | ICD-10-CM | POA: Diagnosis not present

## 2020-06-28 DIAGNOSIS — I63512 Cerebral infarction due to unspecified occlusion or stenosis of left middle cerebral artery: Secondary | ICD-10-CM | POA: Diagnosis not present

## 2020-06-28 DIAGNOSIS — E039 Hypothyroidism, unspecified: Secondary | ICD-10-CM | POA: Diagnosis not present

## 2020-06-28 DIAGNOSIS — M6281 Muscle weakness (generalized): Secondary | ICD-10-CM | POA: Diagnosis not present

## 2020-06-28 DIAGNOSIS — E785 Hyperlipidemia, unspecified: Secondary | ICD-10-CM | POA: Diagnosis not present

## 2020-06-28 DIAGNOSIS — I700 Atherosclerosis of aorta: Secondary | ICD-10-CM | POA: Diagnosis not present

## 2020-06-28 DIAGNOSIS — D649 Anemia, unspecified: Secondary | ICD-10-CM | POA: Diagnosis not present

## 2020-06-28 DIAGNOSIS — R278 Other lack of coordination: Secondary | ICD-10-CM | POA: Diagnosis not present

## 2020-06-28 DIAGNOSIS — M25551 Pain in right hip: Secondary | ICD-10-CM

## 2020-06-28 DIAGNOSIS — I4821 Permanent atrial fibrillation: Secondary | ICD-10-CM | POA: Diagnosis not present

## 2020-06-28 DIAGNOSIS — R279 Unspecified lack of coordination: Secondary | ICD-10-CM | POA: Diagnosis not present

## 2020-06-28 DIAGNOSIS — M170 Bilateral primary osteoarthritis of knee: Secondary | ICD-10-CM | POA: Diagnosis not present

## 2020-06-28 DIAGNOSIS — N319 Neuromuscular dysfunction of bladder, unspecified: Secondary | ICD-10-CM | POA: Diagnosis not present

## 2020-06-28 DIAGNOSIS — G9341 Metabolic encephalopathy: Secondary | ICD-10-CM | POA: Diagnosis not present

## 2020-06-28 LAB — BASIC METABOLIC PANEL
Anion gap: 9 (ref 5–15)
BUN: 11 mg/dL (ref 8–23)
CO2: 22 mmol/L (ref 22–32)
Calcium: 8.8 mg/dL — ABNORMAL LOW (ref 8.9–10.3)
Chloride: 100 mmol/L (ref 98–111)
Creatinine, Ser: 0.51 mg/dL (ref 0.44–1.00)
GFR, Estimated: >60 mL/min (ref >60)
Glucose, Bld: 75 mg/dL (ref 70–99)
Potassium: 3.9 mmol/L (ref 3.5–5.1)
Sodium: 131 mmol/L — ABNORMAL LOW (ref 135–145)

## 2020-06-28 LAB — GLUCOSE, CAPILLARY
Glucose-Capillary: 87 mg/dL (ref 70–99)
Glucose-Capillary: 106 mg/dL — ABNORMAL HIGH (ref 70–99)

## 2020-06-28 LAB — CBC
HCT: 30.6 % — ABNORMAL LOW (ref 36.0–46.0)
Hemoglobin: 9 g/dL — ABNORMAL LOW (ref 12.0–15.0)
MCH: 22.8 pg — ABNORMAL LOW (ref 26.0–34.0)
MCHC: 29.4 g/dL — ABNORMAL LOW (ref 30.0–36.0)
MCV: 77.7 fL — ABNORMAL LOW (ref 80.0–100.0)
Platelets: 294 10*3/uL (ref 150–400)
RBC: 3.94 MIL/uL (ref 3.87–5.11)
RDW: 17.2 % — ABNORMAL HIGH (ref 11.5–15.5)
WBC: 8.1 10*3/uL (ref 4.0–10.5)
nRBC: 0 % (ref 0.0–0.2)

## 2020-06-28 LAB — VITAMIN B12: Vitamin B-12: 5070 pg/mL — ABNORMAL HIGH (ref 180–914)

## 2020-06-28 LAB — FOLATE: Folate: 5 ng/mL — ABNORMAL LOW (ref >5.9)

## 2020-06-28 NOTE — TOC Transition Note (Signed)
Transition of Care [facility]) - CM/SW Discharge Note   Patient Details  Name: Sheila Ray MRN: [id number] Date of Birth: 1941/03/03  [facility] CM/SW Contact:  Sheila Bleak, RN Phone Number: 06/28/2020, 1:04 PM   Clinical Narrative:   Patient discharging to [facility] today. Kerri received INS AUTH, ref # [id number],  RN to call report, Son - Sheila Leriche updated.     Final next level of care: Skilled Nursing Facility Barriers to Discharge: Barriers Resolved   Patient Goals and CMS Choice Patient states their goals for this hospitalization and ongoing recovery are:: Go to SNF CMS Medicare.gov Compare Post Acute Care list provided to:: Patient Represenative (must comment) (pts son) Choice offered to / list presented to : Adult Children  Discharge Placement              Patient chooses bed at: [facility] Patient to be transferred to facility by: [facility] Staff Name of family member notified: Sheila Leriche - son Patient and family notified of of transfer: 06/28/20  Discharge Plan and Services In-house Referral: Clinical Social Work Discharge Planning Services: CM Consult Post Acute Care Choice: Skilled Nursing Facility          DME Arranged: N/A DME Agency: NA      HH Arranged: NA HH Agency: NA     Readmission Risk Interventions Readmission Risk Prevention Plan 06/27/2020  Transportation Screening Complete  HRI or Home Care Consult Complete  Social Work Consult for Recovery Care Planning/Counseling Complete  Palliative Care Screening Not Applicable  Medication Review [employer]) Complete  Some recent data might be hidden

## 2020-06-28 NOTE — Telephone Encounter (Signed)
[facility] calling to let us know that patient's covid vaccine dates should be  1st dose - 07-24-2019 2nd dose - 08-21-2019 Booster - 05-16-2020

## 2020-06-28 NOTE — Progress Notes (Signed)
Patient left in stable condition unit via wheelchair accompanied by [facility] NT. Discharged to [facility].

## 2020-06-28 NOTE — Telephone Encounter (Signed)
Patients chart has been updated.

## 2020-06-28 NOTE — Progress Notes (Signed)
Patient to discharge to [facility]. Called report to [place], receiving nurse at [facility]. IV site removed, site within normal limits. Chronic foley catheter to remain in place at discharge and follow-up with urology outpatient. Receiving RN aware. Pt in stable condition awaiting [facility] staff to pick her up for discharge. AVS added to discharge packet to send with patient.

## 2020-06-28 NOTE — Discharge Summary (Signed)
Physician Discharge Summary  Sheila Ray [id number] DOB: 1940-08-03 DOA: 06/26/2020  PCP: Sharion Balloon, FNP  Admit date: 06/26/2020 Discharge date: 06/28/2020  Admitted From: Home  Disposition:  [facility]   Recommendations for Outpatient Follow-up:  1. Follow up with PCP in 1-2 weeks after discharge from SNF 2. Follow up with Urology as soon as able 3. Please obtain BMP in one week to check sodium    Home Health: N/A  Equipment/Devices: Indwelling foley  Discharge Condition: Fair  CODE STATUS: FULL Diet recommendation: Diabetic, cardiac  Brief/Interim Summary: Mrs. Lloret is a 79 y.o. F with recent thalamic stroke and resulting aphasia and cognitive impairment, Afib on Xarelto, DM, HTN and hypothyroidism who presented with confusion for 2-3 days.  Patient had been independent prior to admission for stroke last month.  Discharged to inpatient rehab, and had been home for about 3 weeks, doing fairly well.  Had had a UTI treated with 7 days ciprofloxacin, completed.  Then in the last few days, started to have confusion, thinking she was on an airplane and once had a dream that she was in a room on fire and could not be redirected.  In the ER, WBC normal, Hgb normal, CT head unremarkable, CXR without focal opacity.  She was noted to have acute on chronic hyponatremia and appeared dehydrated on exam, so was admitted and started on IV fluids.         PRINCIPAL HOSPITAL DIAGNOSIS: Acute metabolic encephalopathy due to Hyponatremia and dehydration    Discharge Diagnoses:   Acute metabolic encephalopathy due to Hyponatremia and dehydration CT head unremarkable.  Started on IV fluids.  Na improved to >130, close to baseline 133-135. Tolerating PO intake well. Ensure patient drinks six to eight 8oz glasses of water daily.  Patient's son reports that she had memory loss noted some year ago or so, was evaluated at that time for dementia, but her cognitive testing  while it noted impairments, was not consistent with "dementia".    It appears she now has some cognitive impairment after her stroke, worsened by her aphasia, and that this encephalopathy reflects that new worse cognitive baseline.       History of chronic Foley catheter with recent UTI Urine culture on admission with pyuria and multiple species but no leukocytosis, fever, hematuria, pain at catheter site or suprapubic pain to suggest infection.  Antibiotics were held here, and she developed no new fever or leukocytosis. Doubt infection. -Follow up with Urology in 1 week   Atrial fibrillation, paroxysmal On apixaban.  Diabetes, well controlled, without hyperglycemia  Prior left thalamic CVA/dyslipidemia Continue Zetia and Eliquis  Hypothyroidism  Hypertension          Discharge Instructions  Discharge Instructions    Diet - low sodium heart healthy   Complete by: As directed    Discharge instructions   Complete by: As directed    Remove foley tomorrow Resume home medications as previously taken   Increase activity slowly   Complete by: As directed      Allergies as of 06/28/2020      Reactions   Crestor [rosuvastatin] Other (See Comments)   Cramps   Lipitor [atorvastatin] Other (See Comments)   Cramps   Pravastatin Other (See Comments)   Cramps    Keflex [cephalexin] Hives      Medication List    STOP taking these medications   Vitamin B12 1000 MCG Tbcr     TAKE these medications  acetaminophen 325 MG tablet Commonly known as: TYLENOL Take 2 tablets (650 mg total) by mouth every 4 (four) hours as needed for mild pain (or temp > 37.5 C (99.5 F)).   alendronate 70 MG tablet Commonly known as: FOSAMAX TAKE 1 TABLET BY MOUTH  WEEKLY AS DIRECTED What changed: See the new instructions.   apixaban 5 MG Tabs tablet Commonly known as: ELIQUIS Take 1 tablet (5 mg total) by mouth 2 (two) times daily.   diltiazem 240 MG 24 hr capsule Commonly  known as: CARDIZEM CD Take 1 capsule (240 mg total) by mouth daily.   ezetimibe 10 MG tablet Commonly known as: ZETIA Take 1 tablet (10 mg total) by mouth daily.   ipratropium 0.06 % nasal spray Commonly known as: ATROVENT Place 1 spray into both nostrils 2 (two) times daily.   magnesium oxide 400 (241.3 Mg) MG tablet Commonly known as: MAG-OX Take 1 tablet (400 mg total) by mouth daily.   melatonin 3 MG Tabs tablet Take 1 tablet (3 mg total) by mouth at bedtime. What changed: how much to take   metFORMIN 1000 MG tablet Commonly known as: GLUCOPHAGE TAKE ONE-HALF TABLET BY  MOUTH DAILY WITH BREAKFAST What changed:   how much to take  how to take this  when to take this  additional instructions   metoprolol succinate 100 MG 24 hr tablet Commonly known as: TOPROL-XL Take 1 tablet (100 mg total) by mouth daily. Take with or immediately following a meal.   onetouch ultrasoft lancets Use as instructed   saccharomyces boulardii 250 MG capsule Commonly known as: FLORASTOR Take 1 capsule (250 mg total) by mouth 2 (two) times daily.   senna-docusate 8.6-50 MG tablet Commonly known as: Senokot-S Take 2 tablets by mouth 2 (two) times daily.   tamsulosin 0.4 MG Caps capsule Commonly known as: FLOMAX Take 1 capsule (0.4 mg total) by mouth daily.       Contact information for follow-up providers    Sharion Balloon, FNP. Schedule an appointment as soon as possible for a visit.   Specialty: Family Medicine Why: One week after skilled rehab discharge Contact information: [place] [address] [phone]        Cleon Gustin, MD. Schedule an appointment as soon as possible for a visit.   Specialty: Urology Why: Reschedule appointment as soon as able Contact information: [address] [place] [address] [phone]            Contact information for after-discharge care    [place]  [facility] .   Service: Skilled Nursing Contact information: [address] [place] [address] [phone]                 Allergies  Allergen Reactions  . Crestor [Rosuvastatin] Other (See Comments)    Cramps  . Lipitor [Atorvastatin] Other (See Comments)    Cramps  . Pravastatin Other (See Comments)    Cramps   . Keflex [Cephalexin] Hives     Procedures/Studies: CT Head Wo Contrast  Result Date: 06/26/2020 CLINICAL DATA:  Mental status changes EXAM: CT HEAD WITHOUT CONTRAST TECHNIQUE: Contiguous axial images were obtained from the base of the skull through the vertex without intravenous contrast. COMPARISON:  MR brain 05/04/2020, CT brain 05/03/2020 FINDINGS: Brain: No evidence of acute infarction, hemorrhage, extra-axial collection, ventriculomegaly, or mass effect. Generalized cerebral atrophy. Periventricular white matter low attenuation likely secondary to  microangiopathy. Vascular: Cerebrovascular atherosclerotic calcifications are noted. Skull: Negative for fracture or focal lesion. Sinuses/Orbits: Visualized portions of the orbits are unremarkable. Visualized portions of the paranasal sinuses are unremarkable. Visualized portions of the mastoid air cells are unremarkable. Other: None. IMPRESSION: 1. No acute intracranial abnormality. 2. Chronic small vessel ischemic changes and cerebral atrophy. Electronically Signed   By: Elige Ko   On: 06/26/2020 11:49   CT ABDOMEN PELVIS W CONTRAST  Result Date: 06/15/2020 CLINICAL DATA:  Lower abdominal pain EXAM: CT ABDOMEN AND PELVIS WITH CONTRAST TECHNIQUE: Multidetector CT imaging of the abdomen and pelvis was performed using the standard protocol following bolus administration of intravenous contrast. CONTRAST:  OMNIPAQUE IOHEXOL 300 MG/ML  SOLN COMPARISON:  None. FINDINGS: Lower chest: Small bilateral pleural effusions. Heart is mildly enlarged. Hepatobiliary: 2 cm low-density lesion in the left  hepatic lobe, likely cyst. Gallbladder unremarkable. Pancreas: No focal abnormality or ductal dilatation. Spleen: No focal abnormality.  Normal size. Adrenals/Urinary Tract: 14 mm nodule in the left adrenal gland. Right adrenal gland unremarkable. Small cyst off the lower pole of the left kidney. No hydronephrosis. Urinary bladder decompressed with Foley catheter in place. Stomach/Bowel: Stomach, large and small bowel grossly unremarkable. Vascular/Lymphatic: Aortic atherosclerosis. No evidence of aneurysm or adenopathy. Reproductive: Prior hysterectomy.  No adnexal masses. Other: No free fluid or free air. Musculoskeletal: No acute bony abnormality. IMPRESSION: Foley catheter in place. Urinary bladder decompressed. No hydronephrosis. Small bilateral pleural effusions. No acute findings in the abdomen or pelvis. Electronically Signed   By: Charlett Nose M.D.   On: 06/15/2020 02:00   DG Chest Port 1 View  Result Date: 06/26/2020 CLINICAL DATA:  Altered mental status. EXAM: PORTABLE CHEST 1 VIEW COMPARISON:  November 19, 21. FINDINGS: Slight decrease in bilateral pleural effusions, now small. Mild overlying bibasilar opacities, favor atelectasis. No visible pneumothorax. Similar enlarged cardiac silhouette and pulmonary vascular congestion. IMPRESSION: 1. Slight decrease in bilateral pleural effusions, now small. Mild overlying bibasilar opacities, favor atelectasis. 2. Cardiomegaly and pulmonary vascular congestion. Electronically Signed   By: Feliberto Harts MD   On: 06/26/2020 11:24       Subjective: No fever, headaache, abdominal pain, pain at catheter site, cough, sputum, hematuria.  She is somewhat anxious and distraught about her finances only.  Discharge Exam: Vitals:   06/27/20 2132 06/28/20 0900  BP: 112/60 114/83  Pulse: 78 92  Resp: 20 20  Temp: 97.8 F (36.6 C) 98 F (36.7 C)  SpO2: 94% 100%   Vitals:   06/27/20 1414 06/27/20 2104 06/27/20 2132 06/28/20 0900  BP: 115/76  112/60  114/83  Pulse: 72  78 92  Resp: 18  20 20   Temp: 97.8 F (36.6 C)  97.8 F (36.6 C) 98 F (36.7 C)  TempSrc: Oral  Oral   SpO2: 96% 92% 94% 100%  Weight:      Height:        General: Pt is alert, awake, sitting up in bed, interactive. Cardiovascular: RRR, nl S1-S2, no murmurs appreciated.   No LE edema.   Respiratory: Normal respiratory rate and rhythm.  CTAB without rales or wheezes. Abdominal: Abdomen soft and non-tender.  No distension or HSM.   Neuro/Psych: Strength symmetric in upper and lower extremities.  Judgment and insight appear impaired.  She perseverates on financs.  She has an aphasia.   The results of significant diagnostics from this hospitalization (including imaging, microbiology, ancillary and laboratory) are listed below for reference.     Microbiology: Recent  Results (from the past 240 hour(s))  Urine Culture     Status: Abnormal   Collection Time: 06/26/20 10:12 AM   Specimen: Urine, Clean Catch  Result Value Ref Range Status   Specimen Description   Final    URINE, CLEAN CATCH Performed at [facility], [address], [place], [place] [address]    Special Requests   Final    NONE Performed at [facility], [address], [place], [place] [address]    Culture MULTIPLE SPECIES PRESENT, SUGGEST RECOLLECTION (A)  Final   Report Status 06/27/2020 FINAL  Final  Resp Panel by RT-PCR (Flu A&B, Covid) Nasopharyngeal Swab     Status: None   Collection Time: 06/26/20  1:45 PM   Specimen: Nasopharyngeal Swab; Nasopharyngeal(NP) swabs in vial transport medium  Result Value Ref Range Status   SARS Coronavirus 2 by RT PCR NEGATIVE NEGATIVE Final    Comment: (NOTE) SARS-CoV-2 target nucleic acids are NOT DETECTED.  The SARS-CoV-2 RNA is generally detectable in upper respiratory specimens during the acute phase of infection. The lowest concentration of SARS-CoV-2 viral copies this assay can detect is 138 copies/mL. A negative result does not preclude  SARS-Cov-2 infection and should not be used as the sole basis for treatment or other patient management decisions. A negative result may occur with  improper specimen collection/handling, submission of specimen other than nasopharyngeal swab, presence of viral mutation(s) within the areas targeted by this assay, and inadequate number of viral copies(<138 copies/mL). A negative result must be combined with clinical observations, patient history, and epidemiological information. The expected result is Negative.  Fact Sheet for Patients:  EntrepreneurPulse.com.au  Fact Sheet for Healthcare Providers:  IncredibleEmployment.be  This test is no t yet approved or cleared by the [place] FDA and  has been authorized for detection and/or diagnosis of SARS-CoV-2 by FDA under an Emergency Use Authorization (EUA). This EUA will remain  in effect (meaning this test can be used) for the duration of the COVID-19 declaration under Section 564(b)(1) of the Act, 21 U.S.C.section 360bbb-3(b)(1), unless the authorization is terminated  or revoked sooner.       Influenza A by PCR NEGATIVE NEGATIVE Final   Influenza B by PCR NEGATIVE NEGATIVE Final    Comment: (NOTE) The Xpert Xpress SARS-CoV-2/FLU/RSV plus assay is intended as an aid in the diagnosis of influenza from Nasopharyngeal swab specimens and should not be used as a sole basis for treatment. Nasal washings and aspirates are unacceptable for Xpert Xpress SARS-CoV-2/FLU/RSV testing.  Fact Sheet for Patients: EntrepreneurPulse.com.au  Fact Sheet for Healthcare Providers: IncredibleEmployment.be  This test is not yet approved or cleared by the [place] FDA and has been authorized for detection and/or diagnosis of SARS-CoV-2 by FDA under an Emergency Use Authorization (EUA). This EUA will remain in effect (meaning this test can be used) for the duration of  the COVID-19 declaration under Section 564(b)(1) of the Act, 21 U.S.C. section 360bbb-3(b)(1), unless the authorization is terminated or revoked.  Performed at [facility], [address] [place], [place] [address]      Labs: BNP (last 3 results) Recent Labs    12/28/19 1136  BNP 99991111*   Basic Metabolic Panel: Recent Labs  Lab 06/26/20 1123 06/27/20 0514 06/28/20 0435  NA 125* 124* 131*  K 4.3 4.3 3.9  CL 94* 95* 100  CO2 20* 20* 22  GLUCOSE 135* 118* 75  BUN 16 16 11   CREATININE 0.61 0.59 0.51  CALCIUM 9.0 8.7* 8.8*  MG  --  1.9  --    Liver Function Tests: Recent Labs  Lab 06/26/20 1123  AST 18  ALT 18  ALKPHOS 87  BILITOT 0.5  PROT 6.1*  ALBUMIN 3.2*   No results for input(s): LIPASE, AMYLASE in the last 168 hours. No results for input(s): AMMONIA in the last 168 hours. CBC: Recent Labs  Lab 06/26/20 1123 06/27/20 0514 06/28/20 0435  WBC 11.8* 8.8 8.1  NEUTROABS 9.4*  --   --   HGB 10.4* 9.1* 9.0*  HCT 34.5* 30.3* 30.6*  MCV 77.2* 76.7* 77.7*  PLT 354 316 294   Cardiac Enzymes: No results for input(s): CKTOTAL, CKMB, CKMBINDEX, TROPONINI in the last 168 hours. BNP: Invalid input(s): POCBNP CBG: Recent Labs  Lab 06/27/20 1135 06/27/20 1617 06/27/20 2100 06/28/20 0741 06/28/20 1100  GLUCAP 136* 122* 90 87 106*   D-Dimer No results for input(s): DDIMER in the last 72 hours. Hgb A1c No results for input(s): HGBA1C in the last 72 hours. Lipid Profile No results for input(s): CHOL, HDL, LDLCALC, TRIG, CHOLHDL, LDLDIRECT in the last 72 hours. Thyroid function studies Recent Labs    06/26/20 1123  TSH 6.501*   Anemia work up Recent Labs    06/28/20 0435  VITAMINB12 5,070*  FOLATE 5.0*   Urinalysis    Component Value Date/Time   COLORURINE YELLOW 06/26/2020 1012   APPEARANCEUR CLOUDY (A) 06/26/2020 1012   LABSPEC 1.015 06/26/2020 1012   PHURINE 5.0 06/26/2020 1012   GLUCOSEU NEGATIVE 06/26/2020 1012   HGBUR SMALL (A)  06/26/2020 1012   BILIRUBINUR NEGATIVE 06/26/2020 1012   KETONESUR 5 (A) 06/26/2020 1012   PROTEINUR 100 (A) 06/26/2020 1012   UROBILINOGEN 0.2 07/19/2014 0950   NITRITE NEGATIVE 06/26/2020 1012   LEUKOCYTESUR LARGE (A) 06/26/2020 1012   Sepsis Labs Invalid input(s): PROCALCITONIN,  WBC,  LACTICIDVEN Microbiology Recent Results (from the past 240 hour(s))  Urine Culture     Status: Abnormal   Collection Time: 06/26/20 10:12 AM   Specimen: Urine, Clean Catch  Result Value Ref Range Status   Specimen Description   Final    URINE, CLEAN CATCH Performed at [facility], [address], [place], [place] [address]    Special Requests   Final    NONE Performed at [facility], [address] [place], [place] [address]    Culture MULTIPLE SPECIES PRESENT, SUGGEST RECOLLECTION (A)  Final   Report Status 06/27/2020 FINAL  Final  Resp Panel by RT-PCR (Flu A&B, Covid) Nasopharyngeal Swab     Status: None   Collection Time: 06/26/20  1:45 PM   Specimen: Nasopharyngeal Swab; Nasopharyngeal(NP) swabs in vial transport medium  Result Value Ref Range Status   SARS Coronavirus 2 by RT PCR NEGATIVE NEGATIVE Final    Comment: (NOTE) SARS-CoV-2 target nucleic acids are NOT DETECTED.  The SARS-CoV-2 RNA is generally detectable in upper respiratory specimens during the acute phase of infection. The lowest concentration of SARS-CoV-2 viral copies this assay can detect is 138 copies/mL. A negative result does not preclude SARS-Cov-2 infection and should not be used as the sole basis for treatment or other patient management decisions. A negative result may occur with  improper specimen collection/handling, submission of specimen other than nasopharyngeal swab, presence of viral mutation(s) within the areas targeted by this assay, and inadequate number of viral copies(<138 copies/mL). A negative result must be combined with clinical observations, patient history, and epidemiological information.  The expected result is Negative.  Fact Sheet for  Patients:  EntrepreneurPulse.com.au  Fact Sheet for Healthcare Providers:  IncredibleEmployment.be  This test is no t yet approved or cleared by the [place] FDA and  has been authorized for detection and/or diagnosis of SARS-CoV-2 by FDA under an Emergency Use Authorization (EUA). This EUA will remain  in effect (meaning this test can be used) for the duration of the COVID-19 declaration under Section 564(b)(1) of the Act, 21 U.S.C.section 360bbb-3(b)(1), unless the authorization is terminated  or revoked sooner.       Influenza A by PCR NEGATIVE NEGATIVE Final   Influenza B by PCR NEGATIVE NEGATIVE Final    Comment: (NOTE) The Xpert Xpress SARS-CoV-2/FLU/RSV plus assay is intended as an aid in the diagnosis of influenza from Nasopharyngeal swab specimens and should not be used as a sole basis for treatment. Nasal washings and aspirates are unacceptable for Xpert Xpress SARS-CoV-2/FLU/RSV testing.  Fact Sheet for Patients: EntrepreneurPulse.com.au  Fact Sheet for Healthcare Providers: IncredibleEmployment.be  This test is not yet approved or cleared by the [place] FDA and has been authorized for detection and/or diagnosis of SARS-CoV-2 by FDA under an Emergency Use Authorization (EUA). This EUA will remain in effect (meaning this test can be used) for the duration of the COVID-19 declaration under Section 564(b)(1) of the Act, 21 U.S.C. section 360bbb-3(b)(1), unless the authorization is terminated or revoked.  Performed at [facility], [address], [place], [place] [address]      Time coordinating discharge: 35 minutes The [place] controlled substances registry was reviewed for this patient      SIGNED:   Edwin Dada, MD  [facility] 06/28/2020, 12:59 PM

## 2020-06-29 ENCOUNTER — Encounter: Payer: Self-pay | Admitting: Adult Health

## 2020-06-29 ENCOUNTER — Ambulatory Visit: Payer: Medicare Other

## 2020-06-29 ENCOUNTER — Non-Acute Institutional Stay (SKILLED_NURSING_FACILITY): Payer: Medicare Other | Admitting: Adult Health

## 2020-06-29 DIAGNOSIS — I4821 Permanent atrial fibrillation: Secondary | ICD-10-CM

## 2020-06-29 DIAGNOSIS — K5901 Slow transit constipation: Secondary | ICD-10-CM

## 2020-06-29 DIAGNOSIS — I700 Atherosclerosis of aorta: Secondary | ICD-10-CM

## 2020-06-29 DIAGNOSIS — J3489 Other specified disorders of nose and nasal sinuses: Secondary | ICD-10-CM

## 2020-06-29 DIAGNOSIS — I63512 Cerebral infarction due to unspecified occlusion or stenosis of left middle cerebral artery: Secondary | ICD-10-CM

## 2020-06-29 DIAGNOSIS — N319 Neuromuscular dysfunction of bladder, unspecified: Secondary | ICD-10-CM

## 2020-06-29 DIAGNOSIS — E871 Hypo-osmolality and hyponatremia: Secondary | ICD-10-CM

## 2020-06-29 DIAGNOSIS — I5032 Chronic diastolic (congestive) heart failure: Secondary | ICD-10-CM

## 2020-06-29 DIAGNOSIS — E860 Dehydration: Secondary | ICD-10-CM

## 2020-06-29 DIAGNOSIS — I152 Hypertension secondary to endocrine disorders: Secondary | ICD-10-CM

## 2020-06-29 DIAGNOSIS — G9341 Metabolic encephalopathy: Secondary | ICD-10-CM

## 2020-06-29 DIAGNOSIS — E1169 Type 2 diabetes mellitus with other specified complication: Secondary | ICD-10-CM

## 2020-06-29 DIAGNOSIS — M810 Age-related osteoporosis without current pathological fracture: Secondary | ICD-10-CM

## 2020-06-29 DIAGNOSIS — F01518 Vascular dementia, unspecified severity, with other behavioral disturbance: Secondary | ICD-10-CM | POA: Insufficient documentation

## 2020-06-29 DIAGNOSIS — E1159 Type 2 diabetes mellitus with other circulatory complications: Secondary | ICD-10-CM

## 2020-06-29 DIAGNOSIS — E039 Hypothyroidism, unspecified: Secondary | ICD-10-CM

## 2020-06-29 DIAGNOSIS — E1149 Type 2 diabetes mellitus with other diabetic neurological complication: Secondary | ICD-10-CM

## 2020-06-29 DIAGNOSIS — E538 Deficiency of other specified B group vitamins: Secondary | ICD-10-CM

## 2020-06-29 DIAGNOSIS — F0151 Vascular dementia with behavioral disturbance: Secondary | ICD-10-CM

## 2020-06-29 DIAGNOSIS — E785 Hyperlipidemia, unspecified: Secondary | ICD-10-CM

## 2020-06-29 DIAGNOSIS — I69320 Aphasia following cerebral infarction: Secondary | ICD-10-CM

## 2020-06-29 DIAGNOSIS — R339 Retention of urine, unspecified: Secondary | ICD-10-CM

## 2020-06-29 NOTE — Progress Notes (Signed)
Location:  [facility] Room Number: 129 Place of Service:  SNF (31)   CODE STATUS: full code   Allergies  Allergen Reactions  . Crestor [Rosuvastatin] Other (See Comments)    Cramps  . Lipitor [Atorvastatin] Other (See Comments)    Cramps  . Pravastatin Other (See Comments)    Cramps   . Keflex [Cephalexin] Hives    Chief Complaint  Patient presents with  . Hospitalization Follow-up    HPI:  She is a 79 year old woman who has been hospitalized from 06-26-20 through 06-28-20. She had had a cva: left middle cerebral artery embolism in November of this year. She went to [facility] inpatient therapy; was then transferred to home with family. She has had a uti which was treated with cipro prior to her admission. She had bouts of urinary retention while in rehab. Her family was unable to perform I/O catheterizations so a foley was placed for long term. She will need to follow up with urology. She was having increased weakness and confusion. She was delusional and was taken to the ED for further work up and treatment. She was admitted with hyponatremia and dehydration.  She was treated with IVF and her sodium returned to 130.  Her family states that she had developed memory loss for about one year prior to her cva. She is here for short term rehab. At this time this represents a long term placement in SNF. She denies any pain; does have right side weakness; has some expressive aphasia denies any insomnia. She will continue to be followed for her chronic illnesses including:  Left middle cerebral artery stroke:    Chronic diastolic congestive heart failure:  Permanent atrial fibrillation    Past Medical History:  Diagnosis Date  . Arthritis   . Atrial fibrillation (HCC)   . Complication of anesthesia   . Diabetes mellitus without complication (HCC)   . Diverticulosis   . Fatigue   . Gastric polyp   . Goiter   . Hyperlipidemia   . Hypertension   . Hypothyroid     taken off of thyroid medication 2012014   . Obese   . Osteopenia   . PONV (postoperative nausea and vomiting)   . Postmenopausal   . Rosacea   . 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 Vitamin D deficiency     Past Surgical History:  Procedure Laterality Date  . ABDOMINAL HYSTERECTOMY  1985  . FOOT SURGERY Right 08/2002  . IR CT HEAD LTD  05/03/2020  . IR PERCUTANEOUS ART THROMBECTOMY/INFUSION INTRACRANIAL INC DIAG ANGIO  05/03/2020  . RADIOLOGY WITH ANESTHESIA N/A 05/03/2020   Procedure: IR WITH ANESTHESIA;  Surgeon: Radiologist, Medication, MD;  Location: [facility] OR;  Service: Radiology;  Laterality: N/A;  . right knee arthroscopy   2013   . TONSILLECTOMY    . 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   Procedure: RIGHT TOTAL KNEE ARTHROPLASTY;  Surgeon: Loanne Drilling, MD;  Location: [facility] ORS;  Service: Orthopedics;  Laterality: Right;    Social History   Socioeconomic History  . Marital status: Single    Spouse name: Not on file  . Number of children: 1  . Years of education: 66  . Highest education level: 12th grade  Occupational History  . Occupation: retired  Tobacco Use  . Smoking status: Never Smoker  . Smokeless tobacco: Never Used  Vaping Use  . Vaping Use: Never used  Substance  and Sexual Activity  . Alcohol use: No  . Drug use: No  . Sexual activity: Not Currently  Other Topics Concern  . Not on file  Social History Narrative  . Not on file   Social Determinants of Health   Financial Resource Strain: Low Risk   . Difficulty of Paying Living Expenses: Not hard at all  Food Insecurity: No Food Insecurity  . Worried About Charity fundraiser in the Last Year: Never true  . Ran Out of Food in the Last Year: Never true  Transportation Needs: No Transportation Needs  . Lack of Transportation (Medical): No  . Lack of Transportation (Non-Medical): No  Physical Activity: Insufficiently Active  . Days of Exercise per Week: 2 days  . Minutes  of Exercise per Session: 20 min  Stress: No Stress Concern Present  . Feeling of Stress : Not at all  Social Connections: Moderately Integrated  . Frequency of Communication with Friends and Family: More than three times a week  . Frequency of Social Gatherings with Friends and Family: More than three times a week  . Attends Religious Services: More than 4 times per year  . Active Member of Clubs or Organizations: Yes  . Attends Archivist Meetings: More than 4 times per year  . Marital Status: Divorced  Human resources officer Violence: Not At Risk  . Fear of Current or Ex-Partner: No  . Emotionally Abused: No  . Physically Abused: No  . Sexually Abused: No   Family History  Problem Relation Age of Onset  . Osteoporosis Mother   . Alzheimer's disease Mother 27  . Arthritis Mother   . Cancer Father        Lung  . Arthritis Sister   . Obesity Sister   . Heart attack Brother 57  . Heart disease Son   . Arthritis Brother   . Arthritis Sister   . Cancer Sister        breast cancer      VITAL SIGNS BP 134/68   Pulse 70   Temp 98 F (36.7 C)   Resp 20   Ht 5\' 4"  (1.626 m)   Wt 168 lb 6.4 oz (76.4 kg)   SpO2 100%   BMI 28.91 kg/m   Outpatient Encounter Medications as of 06/29/2020  Medication Sig  . acetaminophen (TYLENOL) 325 MG tablet Take 2 tablets (650 mg total) by mouth every 4 (four) hours as needed for mild pain (or temp > 37.5 C (99.5 F)).  Marland Kitchen alendronate (FOSAMAX) 70 MG tablet TAKE 1 TABLET BY MOUTH  WEEKLY AS DIRECTED (Patient taking differently: Take 70 mg by mouth once a week.)  . apixaban (ELIQUIS) 5 MG TABS tablet Take 1 tablet (5 mg total) by mouth 2 (two) times daily.  Marland Kitchen diltiazem (CARDIZEM CD) 240 MG 24 hr capsule Take 1 capsule (240 mg total) by mouth daily.  Marland Kitchen ezetimibe (ZETIA) 10 MG tablet Take 1 tablet (10 mg total) by mouth daily.  Marland Kitchen ipratropium (ATROVENT) 0.06 % nasal spray Place 1 spray into both nostrils 2 (two) times daily.  . Lancets  (ONETOUCH ULTRASOFT) lancets Use as instructed  . magnesium oxide (MAG-OX) 400 (241.3 Mg) MG tablet Take 1 tablet (400 mg total) by mouth daily.  . melatonin 3 MG TABS tablet Take 1 tablet (3 mg total) by mouth at bedtime. (Patient taking differently: Take 15 mg by mouth at bedtime.)  . metFORMIN (GLUCOPHAGE) 1000 MG tablet TAKE ONE-HALF TABLET BY  MOUTH  DAILY WITH BREAKFAST (Patient taking differently: Take 500 mg by mouth daily with breakfast.)  . metoprolol succinate (TOPROL-XL) 100 MG 24 hr tablet Take 1 tablet (100 mg total) by mouth daily. Take with or immediately following a meal.  . saccharomyces boulardii (FLORASTOR) 250 MG capsule Take 1 capsule (250 mg total) by mouth 2 (two) times daily.  Marland Kitchen senna-docusate (SENOKOT-S) 8.6-50 MG tablet Take 2 tablets by mouth 2 (two) times daily.  . tamsulosin (FLOMAX) 0.4 MG CAPS capsule Take 1 capsule (0.4 mg total) by mouth daily.   No facility-administered encounter medications on file as of 06/29/2020.     SIGNIFICANT DIAGNOSTIC EXAMS  TODAY  05-14-20: mri/mra of head:  1. Cortical restricted diffusion within the left temporal occipital region. Scattered foci of restricted diffusion are also seen in the left frontoparietal and insular regions and left amygdala. Findings are consistent with acute infarcts. No hemorrhagic transformation, significant mass effect or midline shift. 2. No proximal occlusion or stenosis in the anterior or posterior circulation.   05-14-20: renal ultrasound:  Foley catheter in place. Urinary bladder decompressed. No hydronephrosis. Small bilateral pleural effusions. No acute findings in the abdomen or pelvis.  06-15-20: ct of abdomen and pelvis:  Foley catheter in place. Urinary bladder decompressed. No hydronephrosis. Small bilateral pleural effusions. No acute findings in the abdomen or pelvis.  06-26-20: chest x-ray:  1. Slight decrease in bilateral pleural effusions, now small. Mild overlying bibasilar  opacities, favor atelectasis. 2. Cardiomegaly and pulmonary vascular congestion.  06-26-20: ct of head:  1. No acute intracranial abnormality. 2. Chronic small vessel ischemic changes and cerebral atrophy.    LABS REVIEWED TODAY;   05-04-20: chol 173; ldl 124; trig 65; hdl 36; hgb a1c 6.4 06-26-20: wbc 11.8; hgb 10.4; hct 34.5; mcv 77.2 plt 354; glucose 135; bun 16; creat 0.61; k+ 4.3; na++ 125; na 9.0 GFR >60; liver normal albumin 3.2 tsh 6.501 06-27-20: mag 1.9 06-28-20: wbc 8.1; hgb 9.0; hct 30.6; mcv 77.7 plt 294 glucose 75; bun 11; creat 0.51; k+ 3.9; na++ 131; ca 8.8 GFR>60 bit B 12: 5070; folate 5.0   Review of Systems  Constitutional: Negative for malaise/fatigue.  Respiratory: Negative for cough and shortness of breath.   Cardiovascular: Negative for chest pain and leg swelling.  Gastrointestinal: Negative for abdominal pain.  Musculoskeletal: Negative for back pain.  Skin: Negative.   Neurological: Positive for weakness. Negative for dizziness.       Right side weak   Psychiatric/Behavioral: The patient is not nervous/anxious.     Physical Exam Constitutional:      General: She is not in acute distress.    Appearance: She is well-developed and well-nourished. She is not diaphoretic.  Neck:     Thyroid: No thyromegaly.  Cardiovascular:     Rate and Rhythm: Normal rate and regular rhythm.     Pulses: Normal pulses and intact distal pulses.     Heart sounds: Normal heart sounds.  Pulmonary:     Effort: Pulmonary effort is normal. No respiratory distress.     Breath sounds: Normal breath sounds.  Abdominal:     General: Bowel sounds are normal. There is no distension.     Palpations: Abdomen is soft.     Tenderness: There is no abdominal tenderness.  Musculoskeletal:        General: Normal range of motion.     Cervical back: Neck supple.     Right lower leg: Edema present.     Left lower leg:  Edema present.     Comments: Trace bilateral lower extremity edema Has  right side weakness: mild   Lymphadenopathy:     Cervical: No cervical adenopathy.  Skin:    General: Skin is warm and dry.  Neurological:     Mental Status: She is alert. Mental status is at baseline.     Comments: 06-29-20: SLUMS: 9/30  Psychiatric:        Mood and Affect: Mood and affect and mood normal.        ASSESSMENT/ PLAN:  TODAY  1. Acute metabolic encephalopathy/ acute dehydration/ hyponatremia: she is presently stable; her na++ 131. We are continuing to encourage fluid intake will continue to monitor her status.   2.  Left middle cerebral artery stroke: is neurologically stable: will continue eliquis 5 mg twice daily is on zetia; is unable to tolerate statins  3. Chronic diastolic congestive heart failure: is stable EF 55-60% (05-04-20); will continue toprol xl 100 mg daily will monitor her status.   4. Permanent atrial fibrillation: heart rate is stable; will continue toprol xl 100 mg daily and diltiazem cd 240 mg daily for rate control and eliquis 5 mg twice daily   5. Slow transit constipation: is stable will continue senna s 2 tabs twice daily florastor twice daily   6. Hyperlipidemia associated with type 2 diabetes mellitus: is stable LDL 124 will continue zetia 10 mg daily is unable to tolerate statins.   7. Hypertension associated with diabetes: is stable b/p 134/68 will continue toprol xl 100 mg daily diltiazem cd 240 mg daily   8. Hypothyroidism unspecified type: is stable tsh 6.501 will monitor will check free hormones   9. 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   10. Age related osteoporosis without pathologic fracture: is stable will continue fosamax 70 mh weekly   11. Urinary retention/neurogenic bladder: has long term foley will continue flomax 0.4 mg daily will follow up with urology  12.  Hypomagnesemia: is stable mag 1.9 will continue mag ox daily   13. Aortic atherosclerosis (CT 06-15-20) will monitor   14. Aphasia as late effect of cerebrovascular accident: is without change: being seen by ST  15. Vascular dementia with behavioral disturbance: is without change; weight is 168 pounds will monitor her status.   16. Rhinorrhea: is stable will continue atrovent nasal spray twice daily   17. Chronic anemia: is stable hgb 9.0 will monitor  18. Folate deficiency: is without change level is 5.0 will begin folic acid 1 mg daily     Will check cbc; cmp; free thyroid levels    MD is aware of resident's narcotic use and is in agreement with current plan of care. We will attempt to wean resident as appropriate.  Ok Edwards NP [facility]  Contact [phone] Monday through Friday 8am- 5pm  After hours call [phone]

## 2020-07-01 LAB — CULTURE, URINE COMPREHENSIVE

## 2020-07-03 ENCOUNTER — Encounter (HOSPITAL_COMMUNITY)
Admission: RE | Admit: 2020-07-03 | Discharge: 2020-07-03 | Disposition: A | Discharge status: Home / Self Care | Payer: Medicare Other | Source: Skilled Nursing Facility | Attending: Internal Medicine | Admitting: Internal Medicine

## 2020-07-03 DIAGNOSIS — E871 Hypo-osmolality and hyponatremia: Secondary | ICD-10-CM | POA: Insufficient documentation

## 2020-07-03 LAB — BASIC METABOLIC PANEL
Anion gap: 9 (ref 5–15)
BUN: 19 mg/dL (ref 8–23)
CO2: 19 mmol/L — ABNORMAL LOW (ref 22–32)
Calcium: 8.6 mg/dL — ABNORMAL LOW (ref 8.9–10.3)
Chloride: 93 mmol/L — ABNORMAL LOW (ref 98–111)
Creatinine, Ser: 0.78 mg/dL (ref 0.44–1.00)
GFR, Estimated: >60 mL/min (ref >60)
Glucose, Bld: 125 mg/dL — ABNORMAL HIGH (ref 70–99)
Potassium: 4.3 mmol/L (ref 3.5–5.1)
Sodium: 121 mmol/L — ABNORMAL LOW (ref 135–145)

## 2020-07-04 ENCOUNTER — Non-Acute Institutional Stay (SKILLED_NURSING_FACILITY): Payer: Medicare Other | Admitting: Adult Health

## 2020-07-04 ENCOUNTER — Encounter: Payer: Self-pay | Admitting: Adult Health

## 2020-07-04 ENCOUNTER — Other Ambulatory Visit (HOSPITAL_COMMUNITY)
Admission: RE | Admit: 2020-07-04 | Discharge: 2020-07-04 | Disposition: A | Discharge status: Home / Self Care | Payer: Medicare Other | Source: Skilled Nursing Facility | Attending: Adult Health | Admitting: Adult Health

## 2020-07-04 DIAGNOSIS — I10 Essential (primary) hypertension: Secondary | ICD-10-CM | POA: Diagnosis not present

## 2020-07-04 DIAGNOSIS — F0390 Unspecified dementia without behavioral disturbance: Secondary | ICD-10-CM | POA: Diagnosis not present

## 2020-07-04 DIAGNOSIS — F0151 Vascular dementia with behavioral disturbance: Secondary | ICD-10-CM

## 2020-07-04 DIAGNOSIS — F01518 Vascular dementia, unspecified severity, with other behavioral disturbance: Secondary | ICD-10-CM

## 2020-07-04 DIAGNOSIS — R339 Retention of urine, unspecified: Secondary | ICD-10-CM

## 2020-07-04 LAB — BASIC METABOLIC PANEL
Anion gap: 8 (ref 5–15)
BUN: 20 mg/dL (ref 8–23)
CO2: 22 mmol/L (ref 22–32)
Calcium: 8.9 mg/dL (ref 8.9–10.3)
Chloride: 92 mmol/L — ABNORMAL LOW (ref 98–111)
Creatinine, Ser: 0.84 mg/dL (ref 0.44–1.00)
GFR, Estimated: >60 mL/min (ref >60)
Glucose, Bld: 110 mg/dL — ABNORMAL HIGH (ref 70–99)
Potassium: 4.1 mmol/L (ref 3.5–5.1)
Sodium: 122 mmol/L — ABNORMAL LOW (ref 135–145)

## 2020-07-04 NOTE — Progress Notes (Signed)
Location:  [place] Room Number: 129-P Place of Service:  SNF (31)   CODE STATUS: Full Code  Allergies  Allergen Reactions  . Crestor [Rosuvastatin] Other (See Comments)    Cramps  . Lipitor [Atorvastatin] Other (See Comments)    Cramps  . Pravastatin Other (See Comments)    Cramps   . Keflex [Cephalexin] Hives    Chief Complaint  Patient presents with  . Acute Visit    Psychosis     HPI:  Staff is concerned about her mental status. She continues to have delusional thoughts that her family is abusing her; she is paranoid about going back home. There are no reports of changes in appetite. There are no reports of agitation. There are no reports of insomnia. Her family states she had been on seroquel in the past; that this medication was effective in managing these delusions.   Past Medical History:  Diagnosis Date  . Arthritis   . Atrial fibrillation ([place])   . Complication of anesthesia   . Diabetes mellitus without complication ([place])   . Diverticulosis   . Fatigue   . Gastric polyp   . Goiter   . Hyperlipidemia   . Hypertension   . Hypothyroid    taken off of thyroid medication 2012014   . Obese   . Osteopenia   . PONV (postoperative nausea and vomiting)   . Postmenopausal   . Rosacea   . Stroke ([place]) 01/15/2013   left thalamic  stroke, small vessel  . Vitamin D deficiency     Past Surgical History:  Procedure Laterality Date  . ABDOMINAL HYSTERECTOMY  1985  . FOOT SURGERY Right 08/2002  . IR CT HEAD LTD  05/03/2020  . IR PERCUTANEOUS ART THROMBECTOMY/INFUSION INTRACRANIAL INC DIAG ANGIO  05/03/2020  . RADIOLOGY WITH ANESTHESIA N/A 05/03/2020   Procedure: IR WITH ANESTHESIA;  Surgeon: Radiologist, Medication, MD;  Location: [facility];  Service: Radiology;  Laterality: N/A;  . right knee arthroscopy   2013   . TONSILLECTOMY    . 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   Procedure: RIGHT TOTAL KNEE ARTHROPLASTY;  Surgeon: Gearlean Alf,  MD;  Location: [facility];  Service: Orthopedics;  Laterality: Right;    Social History   Socioeconomic History  . Marital status: Single    Spouse name: Not on file  . Number of children: 1  . Years of education: 101  . Highest education level: 12th grade  Occupational History  . Occupation: retired  Tobacco Use  . Smoking status: Never Smoker  . Smokeless tobacco: Never Used  Vaping Use  . Vaping Use: Never used  Substance and Sexual Activity  . Alcohol use: No  . Drug use: No  . Sexual activity: Not Currently  Other Topics Concern  . Not on file  Social History Narrative  . Not on file   Social Determinants of Health   Financial Resource Strain: Low Risk   . Difficulty of Paying Living Expenses: Not hard at all  Food Insecurity: No Food Insecurity  . Worried About Charity fundraiser in the Last Year: Never true  . Ran Out of Food in the Last Year: Never true  Transportation Needs: No Transportation Needs  . Lack of Transportation (Medical): No  . Lack of Transportation (Non-Medical): No  Physical Activity: Insufficiently Active  . Days of Exercise per Week: 2 days  . Minutes of Exercise per Session: 20 min  Stress: No Stress Concern Present  .  Feeling of Stress : Not at all  Social Connections: Moderately Integrated  . Frequency of Communication with Friends and Family: More than three times a week  . Frequency of Social Gatherings with Friends and Family: More than three times a week  . Attends Religious Services: More than 4 times per year  . Active Member of Clubs or Organizations: Yes  . Attends Archivist Meetings: More than 4 times per year  . Marital Status: Divorced  Human resources officer Violence: Not At Risk  . Fear of Current or Ex-Partner: No  . Emotionally Abused: No  . Physically Abused: No  . Sexually Abused: No   Family History  Problem Relation Age of Onset  . Osteoporosis Mother   . Alzheimer's disease Mother 30  . Arthritis Mother   .  Cancer Father        Lung  . Arthritis Sister   . Obesity Sister   . Heart attack Brother 23  . Heart disease Son   . Arthritis Brother   . Arthritis Sister   . Cancer Sister        breast cancer      VITAL SIGNS BP 103/60   Pulse 62   Temp (!) 97.4 F (36.3 C)   Ht 5\' 4"  (1.626 m)   Wt 170 lb 3.2 oz (77.2 kg)   SpO2 97%   BMI 29.21 kg/m   Outpatient Encounter Medications as of 07/04/2020  Medication Sig  . acetaminophen (TYLENOL) 325 MG tablet Take 2 tablets (650 mg total) by mouth every 4 (four) hours as needed for mild pain (or temp > 37.5 C (99.5 F)).  Marland Kitchen alendronate (FOSAMAX) 70 MG tablet Take 70 mg by mouth once a week. Take with a full glass of water on an empty stomach first thing in the am, 6 am  . apixaban (ELIQUIS) 5 MG TABS tablet Take 1 tablet (5 mg total) by mouth 2 (two) times daily.  Marland Kitchen diltiazem (CARDIZEM CD) 240 MG 24 hr capsule Take 1 capsule (240 mg total) by mouth daily.  Marland Kitchen ezetimibe (ZETIA) 10 MG tablet Take 1 tablet (10 mg total) by mouth daily.  . folic acid (FOLVITE) 1 MG tablet Take 1 mg by mouth daily. 8 am  . ipratropium (ATROVENT) 0.06 % nasal spray Place 1 spray into both nostrils 2 (two) times daily.  . magnesium oxide (MAG-OX) 400 (241.3 Mg) MG tablet Take 1 tablet (400 mg total) by mouth daily.  . melatonin 3 MG TABS tablet Take 1 tablet (3 mg total) by mouth at bedtime.  . metoprolol succinate (TOPROL-XL) 100 MG 24 hr tablet Take 1 tablet (100 mg total) by mouth daily. Take with or immediately following a meal.  . QUEtiapine (SEROQUEL) 25 MG tablet Take 25 mg by mouth at bedtime. 8 pm  . saccharomyces boulardii (FLORASTOR) 250 MG capsule Take 1 capsule (250 mg total) by mouth 2 (two) times daily.  Marland Kitchen senna-docusate (SENOKOT-S) 8.6-50 MG tablet Take 2 tablets by mouth 2 (two) times daily.  . tamsulosin (FLOMAX) 0.4 MG CAPS capsule Take 1 capsule (0.4 mg total) by mouth daily.  Marland Kitchen UNABLE TO FIND DIET: Nas and Consistent Carbohydrate  . Lancets  (ONETOUCH ULTRASOFT) lancets Use as instructed  . [DISCONTINUED] alendronate (FOSAMAX) 70 MG tablet TAKE 1 TABLET BY MOUTH  WEEKLY AS DIRECTED (Patient taking differently: TAKE 1 TABLET BY MOUTH  WEEKLY AS DIRECTED)  . [DISCONTINUED] metFORMIN (GLUCOPHAGE) 1000 MG tablet TAKE ONE-HALF TABLET BY  MOUTH DAILY  WITH BREAKFAST (Patient taking differently: Take 500 mg by mouth daily with breakfast.)   No facility-administered encounter medications on file as of 07/04/2020.     SIGNIFICANT DIAGNOSTIC EXAMS  PREVIOUS   05-14-20: mri/mra of head:  1. Cortical restricted diffusion within the left temporal occipital region. Scattered foci of restricted diffusion are also seen in the left frontoparietal and insular regions and left amygdala. Findings are consistent with acute infarcts. No hemorrhagic transformation, significant mass effect or midline shift. 2. No proximal occlusion or stenosis in the anterior or posterior circulation.   05-14-20: renal ultrasound:  Foley catheter in place. Urinary bladder decompressed. No hydronephrosis. Small bilateral pleural effusions. No acute findings in the abdomen or pelvis.  06-15-20: ct of abdomen and pelvis:  Foley catheter in place. Urinary bladder decompressed. No hydronephrosis. Small bilateral pleural effusions. No acute findings in the abdomen or pelvis.  06-26-20: chest x-ray:  1. Slight decrease in bilateral pleural effusions, now small. Mild overlying bibasilar opacities, favor atelectasis. 2. Cardiomegaly and pulmonary vascular congestion.  06-26-20: ct of head:  1. No acute intracranial abnormality. 2. Chronic small vessel ischemic changes and cerebral atrophy.  NO NEW LABS.     LABS REVIEWED PREVIOUS    05-04-20: chol 173; ldl 124; trig 65; hdl 36; hgb a1c 6.4 06-26-20: wbc 11.8; hgb 10.4; hct 34.5; mcv 77.2 plt 354; glucose 135; bun 16; creat 0.61; k+ 4.3; na++ 125; na 9.0 GFR >60; liver normal albumin 3.2 tsh 6.501 06-27-20: mag  1.9 06-28-20: wbc 8.1; hgb 9.0; hct 30.6; mcv 77.7 plt 294 glucose 75; bun 11; creat 0.51; k+ 3.9; na++ 131; ca 8.8 GFR>60 bit B 12: 5070; folate 5.0   NO NEW LABS.   Review of Systems  Constitutional: Negative for malaise/fatigue.  Respiratory: Negative for cough.   Cardiovascular: Negative for chest pain and leg swelling.  Gastrointestinal: Negative for abdominal pain.  Musculoskeletal: Negative for back pain, joint pain and myalgias.  Skin: Negative.   Neurological: Positive for weakness. Negative for dizziness.       Right side   Psychiatric/Behavioral: The patient is not nervous/anxious.      Physical Exam Constitutional:      General: She is not in acute distress.    Appearance: She is well-developed and well-nourished. She is not diaphoretic.  Neck:     Thyroid: No thyromegaly.  Cardiovascular:     Rate and Rhythm: Normal rate and regular rhythm.     Pulses: Normal pulses and intact distal pulses.     Heart sounds: Normal heart sounds.  Pulmonary:     Effort: Pulmonary effort is normal. No respiratory distress.     Breath sounds: Normal breath sounds.  Abdominal:     General: Bowel sounds are normal. There is no distension.     Palpations: Abdomen is soft.     Tenderness: There is no abdominal tenderness.  Genitourinary:    Comments: Foley  Musculoskeletal:     Cervical back: Neck supple.     Right lower leg: Edema present.     Left lower leg: Edema present.     Comments: Trace bilateral lower extremity edema Has right side weakness: mild    Lymphadenopathy:     Cervical: No cervical adenopathy.  Skin:    General: Skin is warm and dry.  Neurological:     Mental Status: She is alert. Mental status is at baseline.     Comments: 06-29-20: SLUMS: 9/30   Psychiatric:  Mood and Affect: Mood and affect and mood normal.        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 Vascular dementia with behavioral  disturbance 3. Urine retention.    Will begin seroquel 25 mg nightly  Will remove foley for voiding trial and bladder scan every 6 hours   MD is aware of resident's narcotic use and is in agreement with current plan of care. We will attempt to wean resident as appropriate.  Ok Edwards NP [facility]  Contact [phone] Monday through Friday 8am- 5pm  After hours call [phone]

## 2020-07-05 ENCOUNTER — Encounter: Payer: Self-pay | Admitting: Internal Medicine

## 2020-07-05 ENCOUNTER — Other Ambulatory Visit (HOSPITAL_COMMUNITY)
Admission: RE | Admit: 2020-07-05 | Discharge: 2020-07-05 | Disposition: A | Discharge status: Home / Self Care | Payer: Medicare Other | Source: Skilled Nursing Facility | Attending: Adult Health | Admitting: Adult Health

## 2020-07-05 ENCOUNTER — Non-Acute Institutional Stay (SKILLED_NURSING_FACILITY): Payer: Medicare Other | Admitting: Internal Medicine

## 2020-07-05 DIAGNOSIS — I4821 Permanent atrial fibrillation: Secondary | ICD-10-CM

## 2020-07-05 DIAGNOSIS — E1149 Type 2 diabetes mellitus with other diabetic neurological complication: Secondary | ICD-10-CM

## 2020-07-05 DIAGNOSIS — I10 Essential (primary) hypertension: Secondary | ICD-10-CM | POA: Diagnosis not present

## 2020-07-05 DIAGNOSIS — G9341 Metabolic encephalopathy: Secondary | ICD-10-CM | POA: Diagnosis not present

## 2020-07-05 DIAGNOSIS — E871 Hypo-osmolality and hyponatremia: Secondary | ICD-10-CM | POA: Diagnosis not present

## 2020-07-05 LAB — BASIC METABOLIC PANEL
Anion gap: 7 (ref 5–15)
BUN: 16 mg/dL (ref 8–23)
CO2: 23 mmol/L (ref 22–32)
Calcium: 8.5 mg/dL — ABNORMAL LOW (ref 8.9–10.3)
Chloride: 95 mmol/L — ABNORMAL LOW (ref 98–111)
Creatinine, Ser: 0.73 mg/dL (ref 0.44–1.00)
GFR, Estimated: >60 mL/min (ref >60)
Glucose, Bld: 100 mg/dL — ABNORMAL HIGH (ref 70–99)
Potassium: 4 mmol/L (ref 3.5–5.1)
Sodium: 125 mmol/L — ABNORMAL LOW (ref 135–145)

## 2020-07-05 NOTE — Assessment & Plan Note (Addendum)
Current sodium is 125.  No meds in the med list identified as possible causes of hyponatremia.

## 2020-07-05 NOTE — Assessment & Plan Note (Signed)
Accu-Cheks have varied from 102 up to 157 indicating adequate control of diabetes.

## 2020-07-05 NOTE — Assessment & Plan Note (Signed)
Rate is actually slow

## 2020-07-05 NOTE — Assessment & Plan Note (Addendum)
Up to Date reviewed; no specific drugs could be identified which might be causing SIADH.  It can be a sequelae of CVA and also has been reported with use of Cipro. Sodium will be monitored at SNF.

## 2020-07-05 NOTE — Progress Notes (Signed)
NURSING HOME LOCATION: [facility] ROOM NUMBER:  129/P  CODE STATUS: Full Code   PCP:  Junie Spencer, FNP  This is a comprehensive admission note to [facility] performed on this date less than 30 days from date of admission. Included are preadmission medical/surgical history; reconciled medication list; family history; social history and comprehensive review of systems.  Corrections and additions to the records were documented. Comprehensive physical exam was also performed. Additionally a clinical summary was entered for each active diagnosis pertinent to this admission in the Problem List to enhance continuity of care.  HPI: Patient was hospitalized 12/27-12/29/2021 admitted from home with progressive confusion over 2-3 days PTA. The most recent admission was in the context of a recent thalamic stroke with resulting aphasia and cognitive impairment in the context of A. fib.  Prior to the stroke last month the patient had been reported as independent.  She had been discharged to inpatient rehab and subsequently discharged home for approximately 3 weeks prior to readmission. Subsequently she had a UTI in the context of chronic Foley placement for which she received a full course of ciprofloxacin. Subsequent to this she began to have the confusion with hallucinations that she was on an airplane and on occasion that she was in a room on fire. CT of the head was unremarkable.  Urine culture and sensitivity on admission revealed pyuria & multiple species but no leukocytosis.  Antibiotics were held.   She was noted to have acute on chronic hyponatremia with clinical dehydration.  Hyponatremia nadir of 121 improved  to 131 with IV fluids and was felt to be close to her baseline of 133-135. She was discharged to the SNF for rehab.  The long-term plan would be placement in assisted living facility.  Past medical and surgical history: Includes history of left thalamic stroke,  dyslipidemia, diabetes with vascular complications, vitamin D deficiency, history of rosacea, hypothyroidism, history of diverticulosis, and essential hypertension. Surgeries and procedures include abdominal hysterectomy, intracranial thrombectomy, and TKA.  Social history: Nondrinker, never smoked,  Family history: Extensive history reviewed; noncontributory due to advanced age.   Review of systems:  Could not be completed due to dementia. Date given as "the fifth".  When I asked the year thinking that she had correctly named the day, her response was "this year".  She prattled on about having "rectum irritation" and following this comment with "rectum off to nectum".  When I asked her to turn the TV off so I could hear her better; she attempted to manipulate her flip phone.  She then told me that she had tenderness in the right leg as the reason that she could not oppose my hand to test strength because she had had "3 surgeries Friday or Saturday". It was reported that she had delusions that her family was trying to displace her from her home.  Physical exam:  Pertinent or positive findings: She appears confused and frustrated.  Hair is disheveled.  Heart sounds are distant, slow and slightly irregular.  She has minor rales at the bases.  Abdomen is protuberant.  She has 1+ edema at the sock line.  Pedal pulses are decreased.  As noted she had great difficulty following commands especially when I attempted to test strength to opposition.  Strength is poor-fair but, clinically I could not define asymmetry to significant extent due to lack of cooperation.  General appearance:  no acute distress, increased work of breathing is present.   Lymphatic: No  lymphadenopathy about the head, neck, axilla. Eyes: No conjunctival inflammation or lid edema is present. There is no scleral icterus. Ears:  External ear exam shows no significant lesions or deformities.   Nose:  External nasal examination shows no  deformity or inflammation. Nasal mucosa are pink and moist without lesions, exudates Oral exam: Lips and gums are healthy appearing.There is no oropharyngeal erythema or exudate. Neck:  No thyromegaly, masses, tenderness noted.    Heart:  No gallop, murmur, click, rub.  Lungs:  without wheezes, rhonchi, rubs. Abdomen: Bowel sounds are normal.  Abdomen is soft and nontender with no organomegaly, hernias, masses. GU: Deferred  Extremities:  No cyanosis, clubbing Neurologic exam:  Balance, Rhomberg, finger to nose testing could not be completed due to clinical state Skin: Warm & dry w/o tenting. No significant lesions or rash.  See clinical summary under each active problem in the Problem List with associated updated therapeutic plan

## 2020-07-05 NOTE — Patient Instructions (Signed)
See assessment and plan under each diagnosis in the problem list and acutely for this visit 

## 2020-07-06 ENCOUNTER — Other Ambulatory Visit (HOSPITAL_COMMUNITY)
Admission: RE | Admit: 2020-07-06 | Discharge: 2020-07-06 | Disposition: A | Discharge status: Home / Self Care | Payer: Medicare Other | Source: Skilled Nursing Facility | Attending: Adult Health | Admitting: Adult Health

## 2020-07-06 ENCOUNTER — Other Ambulatory Visit: Payer: Self-pay

## 2020-07-06 ENCOUNTER — Encounter: Payer: Self-pay | Admitting: Adult Health

## 2020-07-06 ENCOUNTER — Non-Acute Institutional Stay (SKILLED_NURSING_FACILITY): Payer: Medicare Other | Admitting: Adult Health

## 2020-07-06 DIAGNOSIS — I63512 Cerebral infarction due to unspecified occlusion or stenosis of left middle cerebral artery: Secondary | ICD-10-CM

## 2020-07-06 DIAGNOSIS — I10 Essential (primary) hypertension: Secondary | ICD-10-CM | POA: Diagnosis not present

## 2020-07-06 DIAGNOSIS — E871 Hypo-osmolality and hyponatremia: Secondary | ICD-10-CM | POA: Diagnosis not present

## 2020-07-06 DIAGNOSIS — F0390 Unspecified dementia without behavioral disturbance: Secondary | ICD-10-CM

## 2020-07-06 LAB — COMPREHENSIVE METABOLIC PANEL
ALT: 18 U/L (ref 0–44)
AST: 15 U/L (ref 15–41)
Albumin: 3.1 g/dL — ABNORMAL LOW (ref 3.5–5.0)
Alkaline Phosphatase: 73 U/L (ref 38–126)
Anion gap: 10 (ref 5–15)
BUN: 18 mg/dL (ref 8–23)
CO2: 22 mmol/L (ref 22–32)
Calcium: 8.8 mg/dL — ABNORMAL LOW (ref 8.9–10.3)
Chloride: 96 mmol/L — ABNORMAL LOW (ref 98–111)
Creatinine, Ser: 0.71 mg/dL (ref 0.44–1.00)
GFR, Estimated: >60 mL/min (ref >60)
Glucose, Bld: 85 mg/dL (ref 70–99)
Potassium: 3.9 mmol/L (ref 3.5–5.1)
Sodium: 128 mmol/L — ABNORMAL LOW (ref 135–145)
Total Bilirubin: 0.7 mg/dL (ref 0.3–1.2)
Total Protein: 5.6 g/dL — ABNORMAL LOW (ref 6.5–8.1)

## 2020-07-06 LAB — CBC
HCT: 31 % — ABNORMAL LOW (ref 36.0–46.0)
Hemoglobin: 9.3 g/dL — ABNORMAL LOW (ref 12.0–15.0)
MCH: 22.7 pg — ABNORMAL LOW (ref 26.0–34.0)
MCHC: 30 g/dL (ref 30.0–36.0)
MCV: 75.6 fL — ABNORMAL LOW (ref 80.0–100.0)
Platelets: 341 10*3/uL (ref 150–400)
RBC: 4.1 MIL/uL (ref 3.87–5.11)
RDW: 17 % — ABNORMAL HIGH (ref 11.5–15.5)
WBC: 11.9 10*3/uL — ABNORMAL HIGH (ref 4.0–10.5)
nRBC: 0 % (ref 0.0–0.2)

## 2020-07-06 LAB — T4, FREE: Free T4: 1.49 ng/dL — ABNORMAL HIGH (ref 0.61–1.12)

## 2020-07-06 MED ORDER — FLUCONAZOLE 100 MG PO TABS: 100.0000 mg | ORAL_TABLET | Freq: Every day | ORAL | 0 refills | Status: AC

## 2020-07-06 NOTE — Progress Notes (Signed)
Location:  [facility] Room Number: 129/P Place of Service:  SNF (31)   CODE STATUS: Full Code  Allergies  Allergen Reactions  . Crestor [Rosuvastatin] Other (See Comments)    Cramps  . Lipitor [Atorvastatin] Other (See Comments)    Cramps  . Pravastatin Other (See Comments)    Cramps   . Keflex [Cephalexin] Hives    Chief Complaint  Patient presents with  . 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 There are no reports of agitation; no uncontrolled pain; no reports of insomnia.   Past Medical History:  Diagnosis Date  . Arthritis   . Atrial fibrillation (HCC)   . Complication of anesthesia   . Diabetes mellitus without complication (HCC)   . Diverticulosis   . Fatigue   . Gastric polyp   . Goiter   . Hyperlipidemia   . Hypertension   . Hypothyroid    taken off of thyroid medication 2012014   . Obese   . Osteopenia   . PONV (postoperative nausea and vomiting)   . Postmenopausal   . Rosacea   . 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 Vitamin D deficiency     Past Surgical History:  Procedure Laterality Date  . ABDOMINAL HYSTERECTOMY  1985  . FOOT SURGERY Right 08/2002  . IR CT HEAD LTD  05/03/2020  . IR PERCUTANEOUS ART THROMBECTOMY/INFUSION INTRACRANIAL INC DIAG ANGIO  05/03/2020  . RADIOLOGY WITH ANESTHESIA N/A 05/03/2020   Procedure: IR WITH ANESTHESIA;  Surgeon: Radiologist, Medication, MD;  Location: [facility] OR;  Service: Radiology;  Laterality: N/A;  . right knee arthroscopy   2013   . TONSILLECTOMY    . TOTAL KNEE ARTHROPLASTY Right  07/25/2014   Procedure: RIGHT TOTAL KNEE ARTHROPLASTY;  Surgeon: Loanne Drilling, MD;  Location: [facility] ORS;  Service: Orthopedics;  Laterality: Right;    Social History   Socioeconomic History  . Marital status: Single    Spouse name: Not on file  . Number of children: 1  . Years of education: 7  . Highest education level: 12th grade  Occupational History  . Occupation: retired  Tobacco Use  . Smoking status: Never Smoker  . Smokeless tobacco: Never Used  Vaping Use  . Vaping Use: Never used  Substance and Sexual Activity  . Alcohol use: No  . Drug use: No  . Sexual activity: Not Currently  Other Topics Concern  . Not on file  Social History Narrative  . Not on file   Social Determinants of Health   Financial Resource Strain: Low Risk   . Difficulty of Paying Living Expenses: Not hard at all  Food Insecurity: No Food Insecurity  . Worried About Programme researcher, broadcasting/film/video in the Last Year: Never true  . Ran Out of Food in the Last Year: Never true  Transportation Needs: No Transportation Needs  . Lack of Transportation (Medical): No  . Lack of Transportation (Non-Medical): No  Physical Activity: Insufficiently Active  . Days of Exercise per Week: 2  days  . Minutes of Exercise per Session: 20 min  Stress: No Stress Concern Present  . Feeling of Stress : Not at all  Social Connections: Moderately Integrated  . Frequency of Communication with Friends and Family: More than three times a week  . Frequency of Social Gatherings with Friends and Family: More than three times a week  . Attends Religious Services: More than 4 times per year  . Active Member of Clubs or Organizations: Yes  . Attends Archivist Meetings: More than 4 times per year  . Marital Status: Divorced  Human resources officer Violence: Not At Risk  . Fear of Current or Ex-Partner: No  . Emotionally Abused: No  . Physically Abused: No  . Sexually Abused: No   Family History  Problem Relation Age of Onset   . Osteoporosis Mother   . Alzheimer's disease Mother 55  . Arthritis Mother   . Cancer Father        Lung  . Arthritis Sister   . Obesity Sister   . Heart attack Brother 70  . Heart disease Son   . Arthritis Brother   . Arthritis Sister   . Cancer Sister        breast cancer      VITAL SIGNS BP 132/68   Pulse 76   Temp 98.2 F (36.8 C)   Resp 18   Ht 5\' 4"  (1.626 m)   Wt 167 lb 6.4 oz (75.9 kg)   SpO2 94%   BMI 28.73 kg/m   Outpatient Encounter Medications as of 07/06/2020  Medication Sig  . acetaminophen (TYLENOL) 325 MG tablet Take 2 tablets (650 mg total) by mouth every 4 (four) hours as needed for mild pain (or temp > 37.5 C (99.5 F)).  Marland Kitchen alendronate (FOSAMAX) 70 MG tablet Take 70 mg by mouth once a week. Take with a full glass of water on an empty stomach first thing in the am, 6 am  . apixaban (ELIQUIS) 5 MG TABS tablet Take 1 tablet (5 mg total) by mouth 2 (two) times daily.  Marland Kitchen diltiazem (CARDIZEM CD) 240 MG 24 hr capsule Take 1 capsule (240 mg total) by mouth daily.  Marland Kitchen ezetimibe (ZETIA) 10 MG tablet Take 1 tablet (10 mg total) by mouth daily.  . folic acid (FOLVITE) 1 MG tablet Take 1 mg by mouth daily. 8 am  . ipratropium (ATROVENT) 0.06 % nasal spray Place 1 spray into both nostrils 2 (two) times daily.  . magnesium oxide (MAG-OX) 400 (241.3 Mg) MG tablet Take 1 tablet (400 mg total) by mouth daily.  . melatonin 3 MG TABS tablet Take 1 tablet (3 mg total) by mouth at bedtime.  . metoprolol succinate (TOPROL-XL) 100 MG 24 hr tablet Take 1 tablet (100 mg total) by mouth daily. Take with or immediately following a meal.  . NON FORMULARY Accu-check daily. Notify provide of results under 60 or over 400. Once A Day 06:00 AM  . QUEtiapine (SEROQUEL) 25 MG tablet Take 25 mg by mouth at bedtime. 8 pm  . saccharomyces boulardii (FLORASTOR) 250 MG capsule Take 1 capsule (250 mg total) by mouth 2 (two) times daily.  Marland Kitchen senna-docusate (SENOKOT-S) 8.6-50 MG tablet Take 2  tablets by mouth 2 (two) times daily.  . tamsulosin (FLOMAX) 0.4 MG CAPS capsule Take 1 capsule (0.4 mg total) by mouth daily.  Marland Kitchen UNABLE TO FIND DIET: Nas and Consistent Carbohydrate   No facility-administered encounter medications on file as of 07/06/2020.  SIGNIFICANT DIAGNOSTIC EXAMS  PREVIOUS  05-14-20: mri/mra of head:  1. Cortical restricted diffusion within the left temporal occipital region. Scattered foci of restricted diffusion are also seen in the left frontoparietal and insular regions and left amygdala. Findings are consistent with acute infarcts. No hemorrhagic transformation, significant mass effect or midline shift. 2. No proximal occlusion or stenosis in the anterior or posterior circulation.   05-14-20: renal ultrasound:  Foley catheter in place. Urinary bladder decompressed. No hydronephrosis. Small bilateral pleural effusions. No acute findings in the abdomen or pelvis.  06-15-20: ct of abdomen and pelvis:  Foley catheter in place. Urinary bladder decompressed. No hydronephrosis. Small bilateral pleural effusions. No acute findings in the abdomen or pelvis.  06-26-20: chest x-ray:  1. Slight decrease in bilateral pleural effusions, now small. Mild overlying bibasilar opacities, favor atelectasis. 2. Cardiomegaly and pulmonary vascular congestion.  06-26-20: ct of head:  1. No acute intracranial abnormality. 2. Chronic small vessel ischemic changes and cerebral atrophy.  NO NEW EXAMS.     LABS REVIEWED PREVIOUS    05-04-20: chol 173; ldl 124; trig 65; hdl 36; hgb a1c 6.4 06-26-20: wbc 11.8; hgb 10.4; hct 34.5; mcv 77.2 plt 354; glucose 135; bun 16; creat 0.61; k+ 4.3; na++ 125; na 9.0 GFR >60; liver normal albumin 3.2 tsh 6.501 06-27-20: mag 1.9 06-28-20: wbc 8.1; hgb 9.0; hct 30.6; mcv 77.7 plt 294 glucose 75; bun 11; creat 0.51; k+ 3.9; na++ 131; ca 8.8 GFR>60 bit B 12: 5070; folate 5.0   TODAY  07-03-20: glucose 125; bun 19; creat 0.78; k+ 4.3; na++  121; ca 8.6 GFR >60 07-04-20: glucose 110; bun 20; creat 0.84; k+ 4.0; na++ 122; ca 8.9 GFR >60 07-05-20: glucose 100; bun 16; creat 0.73; k+ 4.0; na++ 125; ca 8.5; GFR >60 07-06-20: wbc 11.9 hgb 9.3; hct 31.0; mcv 75.6 plt 341; glucose 85; bun 18; creat 0.71; k+ 3.9; na++ 128; ca 8.8 liver normal albumin 3.1; GFR >60    Review of Systems  Constitutional: Negative for malaise/fatigue.  Respiratory: Negative for cough and shortness of breath.   Cardiovascular: Negative for chest pain, palpitations and leg swelling.  Gastrointestinal: Negative for abdominal pain, constipation and heartburn.  Musculoskeletal: Negative for back pain, joint pain and myalgias.  Skin: Negative.   Neurological: Positive for weakness. Negative for dizziness.       Right side weakness   Psychiatric/Behavioral: The patient is not nervous/anxious.    Physical Exam Constitutional:      General: She is not in acute distress.    Appearance: She is well-developed and well-nourished. She is not diaphoretic.  Neck:     Thyroid: No thyromegaly.  Cardiovascular:     Rate and Rhythm: Normal rate and regular rhythm.     Pulses: Normal pulses and intact distal pulses.     Heart sounds: Normal heart sounds.  Pulmonary:     Effort: Pulmonary effort is normal. No respiratory distress.     Breath sounds: Normal breath sounds.  Abdominal:     General: Bowel sounds are normal. There is no distension.     Palpations: Abdomen is soft.     Tenderness: There is no abdominal tenderness.  Genitourinary:    Comments: foley Musculoskeletal:        General: No edema.     Cervical back: Neck supple.     Right lower leg: No edema.     Left lower leg: No edema.     Comments: Right side weakness  Is  able to move all extremities   Lymphadenopathy:     Cervical: No cervical adenopathy.  Skin:    General: Skin is warm and dry.  Neurological:     Mental Status: She is alert. Mental status is at baseline.     Comments: 06-29-20: SLUMS:  9/30   Psychiatric:        Mood and Affect: Mood and affect and mood normal.          ASSESSMENT/ PLAN:  TODAY  1. 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 Hyponatremia: is without change na++ 128 will monitor   3. 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    PREVIOUS   4. Chronic diastolic congestive heart failure: is stable EF 55-60% (05-04-20); will continue toprol xl 100 mg daily will monitor her status.   5. Permanent atrial fibrillation: heart rate is stable; will continue toprol xl 100 mg daily and diltiazem cd 240 mg daily for rate control and eliquis 5 mg twice daily   6. Slow transit constipation: is stable will continue senna s 2 tabs twice daily florastor twice daily   7. Hyperlipidemia associated with type 2 diabetes mellitus: is stable LDL 124 will continue zetia 10 mg daily is unable to tolerate statins.   8. Hypertension associated with diabetes: is stable b/p 134/68 will continue toprol xl 100 mg daily diltiazem cd 240 mg daily   9. Hypothyroidism unspecified type: is stable tsh 6.501 will monitor will check free hormones   10. 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   31. Age related osteoporosis without pathologic fracture: is stable will continue fosamax 70 mh weekly   12. Urinary retention/neurogenic bladder: has long term foley will continue flomax 0.4 mg daily will follow up with urology  13. Hypomagnesemia: is stable mag 1.9 will continue mag ox daily   14. Aortic atherosclerosis (CT 06-15-20) will monitor   15. Aphasia as late effect of cerebrovascular accident: is without change: being seen by ST  16. Vascular dementia with  behavioral disturbance: is without change; weight is 168 pounds will monitor her status.   17. Rhinorrhea: is stable will continue atrovent nasal spray twice daily   18. Chronic anemia: is stable hgb 9.0 will monitor  19. Folate deficiency: is without change level is 5.0 will begin folic acid 1 mg daily      MD is aware of resident's narcotic use and is in agreement with current plan of care. We will attempt to wean resident as appropriate.  Ok Edwards NP [facility]  Contact [phone] Monday through Friday 8am- 5pm  After hours call [phone]

## 2020-07-07 DIAGNOSIS — F0390 Unspecified dementia without behavioral disturbance: Secondary | ICD-10-CM | POA: Insufficient documentation

## 2020-07-07 DIAGNOSIS — I63512 Cerebral infarction due to unspecified occlusion or stenosis of left middle cerebral artery: Secondary | ICD-10-CM | POA: Diagnosis not present

## 2020-07-07 LAB — T3, FREE: T3, Free: 2.1 pg/mL (ref 2.0–4.4)

## 2020-07-07 LAB — T4: T4, Total: 8.9 ug/dL (ref 4.5–12.0)

## 2020-07-11 ENCOUNTER — Non-Acute Institutional Stay (SKILLED_NURSING_FACILITY): Payer: Medicare Other | Admitting: Adult Health

## 2020-07-11 DIAGNOSIS — I5032 Chronic diastolic (congestive) heart failure: Secondary | ICD-10-CM

## 2020-07-13 ENCOUNTER — Encounter: Payer: Self-pay | Admitting: Adult Health

## 2020-07-13 ENCOUNTER — Ambulatory Visit: Payer: Medicare Other | Admitting: Adult Health

## 2020-07-13 ENCOUNTER — Non-Acute Institutional Stay (SKILLED_NURSING_FACILITY): Payer: Medicare Other | Admitting: Adult Health

## 2020-07-13 VITALS — BP 89/55 | HR 51 | Ht 64.0 in

## 2020-07-13 DIAGNOSIS — I5032 Chronic diastolic (congestive) heart failure: Secondary | ICD-10-CM | POA: Diagnosis not present

## 2020-07-13 DIAGNOSIS — I639 Cerebral infarction, unspecified: Secondary | ICD-10-CM | POA: Diagnosis not present

## 2020-07-13 DIAGNOSIS — I700 Atherosclerosis of aorta: Secondary | ICD-10-CM

## 2020-07-13 DIAGNOSIS — I63512 Cerebral infarction due to unspecified occlusion or stenosis of left middle cerebral artery: Secondary | ICD-10-CM

## 2020-07-13 DIAGNOSIS — I480 Paroxysmal atrial fibrillation: Secondary | ICD-10-CM

## 2020-07-13 DIAGNOSIS — F0151 Vascular dementia with behavioral disturbance: Secondary | ICD-10-CM | POA: Diagnosis not present

## 2020-07-13 DIAGNOSIS — F01518 Vascular dementia, unspecified severity, with other behavioral disturbance: Secondary | ICD-10-CM

## 2020-07-13 NOTE — Patient Instructions (Signed)
Continued particpation with PT/OT/SLP at [facility]  Highly recommend use of compression stockings BLE during the day to help reduce edema   Can try Biotin lozenges for dry mouth - also speak with urology regarding use of current medication concerns  Continue Eliquis (apixaban) daily  and Zetia 10mg  daily  for secondary stroke prevention  Continue to follow up with PCP regarding cholesterol and blood pressure management  Maintain strict control of hypertension with blood pressure goal below 130/90 and cholesterol with LDL cholesterol (bad cholesterol) goal below 70 mg/dL.      Followup in the future with me in 3 months or call earlier if needed     Thank you for coming to see [place] at [facility]. I hope we have been able to provide you high quality care today.  You may receive a patient satisfaction survey over the next few weeks. We would appreciate your feedback and comments so that we may continue to improve ourselves and the health of our patients.    Vascular Dementia Dementia is a condition in which a person has problems with thinking, memory, and behavior that are severe enough to interfere with daily life. Vascular dementia is a type of dementia. It results from brain damage that is caused by the brain not getting enough blood. This condition may also be called vascular cognitive impairment. What are the causes? Vascular dementia is caused by conditions that reduce blood flow to the brain. Common causes of this condition include:  Multiple small strokes. These may happen without symptoms (silent stroke).  Major stroke.  Damage to small blood vessels in the brain (cerebral small vessel disease). What increases the risk? The following factors may make you more likely to develop this condition:  Having had a stroke.  Having high blood pressure (hypertension) or high cholesterol.  Having a disease that affects the heart or blood vessels.  Smoking.  Not  being active.  Being over age 72.  Having any of these conditions: ? Diabetes. ? Metabolic syndrome. ? Obesity. ? Depression. ? A genetic condition that leads to stroke, such as CADASIL (cerebral autosomal dominant arteriopathy with subcortical infarcts and leukoencephalopathy). What are the signs or symptoms? Symptoms of vascular dementia can vary from one person to another. Symptoms may be mild or severe depending on the amount of damage and which parts of the brain have been affected. Symptoms may begin suddenly or may develop slowly. Mental symptoms may include:  Confusion and memory problems.  Poor attention and concentration.  Trouble understanding speech.  Depression.  Personality changes.  Trouble recognizing familiar people.  Agitation or aggression.  Paranoia or false beliefs (delusions).  Hallucinations. These involve seeing, hearing, tasting, smelling, or feeling things that are not real. Physical symptoms may include:  Weakness.  Poor balance.  Loss of bladder or bowel control (incontinence).  Unsteady walking (gait).  Speaking problems. Behavioral symptoms may include:  Getting lost in familiar places.  Problems with planning and judgment.  Trouble following instructions.  Social problems.  Emotional outbursts.  Trouble with daily activities and self-care.  Problems handling money. Symptoms may remain stable, or they may get worse over time. Symptoms of vascular dementia may be similar to those of Alzheimer's disease. The two conditions can occur together (mixed dementia). How is this diagnosed? This condition may be diagnosed based on:  Your medical history and a physical exam.  Symptoms or changes that are reported by friends and family.  Lab tests or other tests that check  brain and nervous system function. Tests may include: ? Blood tests. ? Brain imaging tests. ? Tests of movement, speech, and other daily activities (neurological  exam). ? Tests of memory, thinking, and problem-solving (neuropsychological or neurocognitive testing). There is not a specific test to diagnose vascular dementia. Diagnosis may involve several specialists. These may include:  A health care provider who specializes in the brain and nervous system (neurologist).  A health care provider who specializes in understanding how problems in the brain can alter behavior and cognitive function (neuropsychologist). How is this treated? There is no cure for vascular dementia. Brain damage that has already occurred cannot be reversed. Treatment depends on:  How severe the condition is.  Which parts of your brain have been affected.  Your overall health. Treatment measures aim to:  Treat the underlying cause of vascular dementia and manage risk factors. This may include: ? Controlling blood pressure or lowering cholesterol. ? Treating diabetes. ? Making lifestyle changes, such as quitting smoking or losing weight.  Manage symptoms.  Prevent further brain damage.  Improve the person's health and quality of life. Treatment for dementia may involve a team of health care providers, including:  A neurologist.  A provider who specializes in disorders of the mind (psychiatrist).  A provider who specializes in helping people learn daily living skills (occupational therapist).  A provider who focuses on speech and language changes (Electrical engineer).  A heart specialist (cardiologist).  A provider who helps people learn how to manage physical changes, such as movement and walking (exercise physiologist or physical therapist). Follow these instructions at home: Medicines  Take over-the-counter and prescription medicines only as told by your health care provider.  Use a pill organizer or pill reminder to help you manage your medicines. Lifestyle  Do not use any products that contain nicotine or tobacco, such as cigarettes, e-cigarettes, and  chewing tobacco. If you need help quitting, ask your health care provider.  Eat a healthy, balanced diet.  Maintain a healthy weight, or lose weight if needed.  Be physically active as told by your health care provider. General instructions  Work with your health care provider to determine what you need help with and what your safety needs are.  Follow the health care provider's instructions for treating the condition that caused the dementia.  Keep all follow-up visits. This is important. If you are the caregiver: People with vascular dementia may need regular help at home or daily care from a family member or home health care worker. Home care for a person with vascular dementia depends on what caused the condition and how severe the symptoms are. General guidelines for caregivers include:  Help the person with dementia remember people, appointments, and daily activities.  Help the person with dementia manage his or her medicines.  Help family and friends learn about ways to communicate with the person with dementia.  Create a safe living space to reduce the risk of injury or falls.  Find a support group to help caregivers and family cope with the effects of dementia.   Where to find more information  [employer] of Neurological Disorders and Stroke: MasterBoxes.it Contact a health care provider if:  New behavioral problems develop.  Problems with swallowing develop.  Confusion gets worse.  Sleepiness gets worse. Get help right away if:  Loss of consciousness occurs.  There is a sudden loss of speech, balance, or thinking ability.  New numbness or paralysis occurs.  Sudden, severe headache occurs.  Vision  is lost or suddenly gets worse in one or both eyes. If you ever feel like the person with dementia may hurt himself or herself or others, or if he or she shares thoughts about taking his or her own life, get help right away. You can go to your nearest  emergency department or:  Call your local emergency services (911 in the [place]  Call a suicide crisis helpline, such as the [place] at (918)475-2461. This is open 24 hours a day in the [place]  Text the Crisis Text Line at 631-565-7424 (in the [place].). Summary  Vascular dementia is a type of dementia. It results from brain damage that is caused by the brain not getting enough blood.  Vascular dementia is caused by conditions that reduce blood flow to the brain. Common causes of this condition include stroke and damage to small blood vessels in the brain.  Treatment focuses on treating the underlying cause of vascular dementia and managing any risk factors.  People with vascular dementia may need regular help at home or daily care from a family member or home health care worker.  Contact a health care provider if you or your caregiver notices any new symptoms. This information is not intended to replace advice given to you by your health care provider. Make sure you discuss any questions you have with your health care provider. Document Revised: 11/01/2019 Document Reviewed: 11/01/2019 Elsevier Patient Education  [place].

## 2020-07-13 NOTE — Progress Notes (Signed)
Location:  [place] Room Number: 129/P Place of Service:  SNF (31)   CODE STATUS: Full Code  Allergies  Allergen Reactions  . Crestor [Rosuvastatin] Other (See Comments)    Cramps  . Lipitor [Atorvastatin] Other (See Comments)    Cramps  . Pravastatin Other (See Comments)    Cramps   . Keflex [Cephalexin] Hives    Chief Complaint  Patient presents with  . Acute Visit    Care Plan Meeting     HPI:  We have come together for her care plan meeting. Family present. BIMS 5/15 mood 4/30. She requires limited to extensive assist with her adls. Is frequently incontinent of bowel. Has foley has urology appointment on 07-19-20; she has failed one removal. She does participate in therapy with good and bad days. In therapy: bed mobility and transfers with contact guard. Is using rolling walker with minimal assist; she requires assistance with her adls. She has not had any falls.  She was restarted on her seroquel on 07-04-20. Her weight is stable with a good appetite. She continues to be followed for her chronic illnesses including: Left cerebral middle artery stroke  Aortic atherosclerosis   Chronic diastolic congestive heart failure   Past Medical History:  Diagnosis Date  . Arthritis   . Atrial fibrillation ([place])   . Complication of anesthesia   . Diabetes mellitus without complication ([place])   . Diverticulosis   . Fatigue   . Gastric polyp   . Goiter   . Hyperlipidemia   . Hypertension   . Hypothyroid    taken off of thyroid medication 2012014   . Obese   . Osteopenia   . PONV (postoperative nausea and vomiting)   . Postmenopausal   . Rosacea   . Stroke ([place]) 01/15/2013   left thalamic  stroke, small vessel  . Vitamin D deficiency     Past Surgical History:  Procedure Laterality Date  . ABDOMINAL HYSTERECTOMY  1985  . FOOT SURGERY Right 08/2002  . IR CT HEAD LTD  05/03/2020  . IR PERCUTANEOUS ART THROMBECTOMY/INFUSION INTRACRANIAL INC DIAG ANGIO   05/03/2020  . RADIOLOGY WITH ANESTHESIA N/A 05/03/2020   Procedure: IR WITH ANESTHESIA;  Surgeon: Radiologist, Medication, MD;  Location: [place];  Service: Radiology;  Laterality: N/A;  . right knee arthroscopy   2013   . TONSILLECTOMY    . 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   Procedure: RIGHT TOTAL KNEE ARTHROPLASTY;  Surgeon: Gearlean Alf, MD;  Location: [facility];  Service: Orthopedics;  Laterality: Right;    Social History   Socioeconomic History  . Marital status: Single    Spouse name: Not on file  . Number of children: 1  . Years of education: 67  . Highest education level: 12th grade  Occupational History  . Occupation: retired  Tobacco Use  . Smoking status: Never Smoker  . Smokeless tobacco: Never Used  Vaping Use  . Vaping Use: Never used  Substance and Sexual Activity  . Alcohol use: No  . Drug use: No  . Sexual activity: Not Currently  Other Topics Concern  . Not on file  Social History Narrative  . Not on file   Social Determinants of Health   Financial Resource Strain: Low Risk   . Difficulty of Paying Living Expenses: Not hard at all  Food Insecurity: No Food Insecurity  . Worried About Charity fundraiser in the Last Year: Never true  . Ran Out of  Food in the Last Year: Never true  Transportation Needs: No Transportation Needs  . Lack of Transportation (Medical): No  . Lack of Transportation (Non-Medical): No  Physical Activity: Insufficiently Active  . Days of Exercise per Week: 2 days  . Minutes of Exercise per Session: 20 min  Stress: No Stress Concern Present  . Feeling of Stress : Not at all  Social Connections: Moderately Integrated  . Frequency of Communication with Friends and Family: More than three times a week  . Frequency of Social Gatherings with Friends and Family: More than three times a week  . Attends Religious Services: More than 4 times per year  . Active Member of Clubs or Organizations: Yes  . Attends Archivist  Meetings: More than 4 times per year  . Marital Status: Divorced  Human resources officer Violence: Not At Risk  . Fear of Current or Ex-Partner: No  . Emotionally Abused: No  . Physically Abused: No  . Sexually Abused: No   Family History  Problem Relation Age of Onset  . Osteoporosis Mother   . Alzheimer's disease Mother 50  . Arthritis Mother   . Cancer Father        Lung  . Arthritis Sister   . Obesity Sister   . Heart attack Brother 38  . Heart disease Son   . Arthritis Brother   . Arthritis Sister   . Cancer Sister        breast cancer      VITAL SIGNS BP 98/66   Pulse 73   Temp (!) 97.5 F (36.4 C)   Resp 20   Ht 5\' 4"  (1.626 m)   Wt 167 lb 1.6 oz (75.8 kg)   SpO2 93%   BMI 28.68 kg/m   Outpatient Encounter Medications as of 07/13/2020  Medication Sig  . acetaminophen (TYLENOL) 325 MG tablet Take 2 tablets (650 mg total) by mouth every 4 (four) hours as needed for mild pain (or temp > 37.5 C (99.5 F)).  Marland Kitchen alendronate (FOSAMAX) 70 MG tablet Take 70 mg by mouth once a week. Take with a full glass of water on an empty stomach first thing in the am, 6 am  . apixaban (ELIQUIS) 5 MG TABS tablet Take 1 tablet (5 mg total) by mouth 2 (two) times daily.  Roseanne Kaufman [place]-Castor Oil (VENELEX) OINT Apply topically. ointment; - ; amt: 1 application; topical  . diltiazem (CARDIZEM CD) 240 MG 24 hr capsule Take 1 capsule (240 mg total) by mouth daily.  Marland Kitchen ezetimibe (ZETIA) 10 MG tablet Take 1 tablet (10 mg total) by mouth daily.  . folic acid (FOLVITE) 1 MG tablet Take 1 mg by mouth daily. 8 am  . furosemide (LASIX) 40 MG tablet Take 40 mg by mouth daily.  Marland Kitchen ipratropium (ATROVENT) 0.06 % nasal spray Place 1 spray into both nostrils 2 (two) times daily.  . magnesium oxide (MAG-OX) 400 (241.3 Mg) MG tablet Take 1 tablet (400 mg total) by mouth daily.  . melatonin 3 MG TABS tablet Take 1 tablet (3 mg total) by mouth at bedtime.  . metoprolol succinate (TOPROL-XL) 100 MG 24 hr tablet  Take 1 tablet (100 mg total) by mouth daily. Take with or immediately following a meal.  . metoprolol succinate (TOPROL-XL) 100 MG 24 hr tablet Take 100 mg by mouth daily. Take with or immediately following a meal.  . NON FORMULARY Accu-check daily. Notify provide of results under 60 or over 400. Once A Day  06:00 AM  . potassium chloride (KLOR-CON) 10 MEQ tablet Take 10 mEq by mouth daily.  . QUEtiapine (SEROQUEL) 25 MG tablet Take 25 mg by mouth at bedtime. 8 pm  . saccharomyces boulardii (FLORASTOR) 250 MG capsule Take 1 capsule (250 mg total) by mouth 2 (two) times daily.  Marland Kitchen senna-docusate (SENOKOT-S) 8.6-50 MG tablet Take 2 tablets by mouth 2 (two) times daily.  . tamsulosin (FLOMAX) 0.4 MG CAPS capsule Take 1 capsule (0.4 mg total) by mouth daily.  Marland Kitchen UNABLE TO FIND DIET: Nas and Consistent Carbohydrate   No facility-administered encounter medications on file as of 07/13/2020.     SIGNIFICANT DIAGNOSTIC EXAMS   PREVIOUS  05-14-20: mri/mra of head:  1. Cortical restricted diffusion within the left temporal occipital region. Scattered foci of restricted diffusion are also seen in the left frontoparietal and insular regions and left amygdala. Findings are consistent with acute infarcts. No hemorrhagic transformation, significant mass effect or midline shift. 2. No proximal occlusion or stenosis in the anterior or posterior circulation.   05-14-20: renal ultrasound:  Foley catheter in place. Urinary bladder decompressed. No hydronephrosis. Small bilateral pleural effusions. No acute findings in the abdomen or pelvis.  06-15-20: ct of abdomen and pelvis:  Foley catheter in place. Urinary bladder decompressed. No hydronephrosis. Small bilateral pleural effusions. No acute findings in the abdomen or pelvis.  06-26-20: chest x-ray:  1. Slight decrease in bilateral pleural effusions, now small. Mild overlying bibasilar opacities, favor atelectasis. 2. Cardiomegaly and pulmonary  vascular congestion.  06-26-20: ct of head:  1. No acute intracranial abnormality. 2. Chronic small vessel ischemic changes and cerebral atrophy.  NO NEW EXAMS.     LABS REVIEWED PREVIOUS    05-04-20: chol 173; ldl 124; trig 65; hdl 36; hgb a1c 6.4 06-26-20: wbc 11.8; hgb 10.4; hct 34.5; mcv 77.2 plt 354; glucose 135; bun 16; creat 0.61; k+ 4.3; na++ 125; na 9.0 GFR >60; liver normal albumin 3.2 tsh 6.501 06-27-20: mag 1.9 06-28-20: wbc 8.1; hgb 9.0; hct 30.6; mcv 77.7 plt 294 glucose 75; bun 11; creat 0.51; k+ 3.9; na++ 131; ca 8.8 GFR>60 bit B 12: 5070; folate 5.0  07-03-20: glucose 125; bun 19; creat 0.78; k+ 4.3; na++ 121; ca 8.6 GFR >60 07-04-20: glucose 110; bun 20; creat 0.84; k+ 4.0; na++ 122; ca 8.9 GFR >60 07-05-20: glucose 100; bun 16; creat 0.73; k+ 4.0; na++ 125; ca 8.5; GFR >60 07-06-20: wbc 11.9 hgb 9.3; hct 31.0; mcv 75.6 plt 341; glucose 85; bun 18; creat 0.71; k+ 3.9; na++ 128; ca 8.8 liver normal albumin 3.1; GFR >60   NO NEW LABS.   Review of Systems  Constitutional: Negative for malaise/fatigue.  Respiratory: Negative for cough and shortness of breath.   Cardiovascular: Negative for chest pain, palpitations and leg swelling.  Gastrointestinal: Negative for abdominal pain, constipation and heartburn.  Musculoskeletal: Negative for back pain, joint pain and myalgias.  Skin: Negative.   Neurological: Negative for dizziness.  Psychiatric/Behavioral: The patient is not nervous/anxious.    Physical Exam Constitutional:      General: She is not in acute distress.    Appearance: She is well-developed and well-nourished. She is not diaphoretic.  Neck:     Thyroid: No thyromegaly.  Cardiovascular:     Rate and Rhythm: Normal rate and regular rhythm.     Pulses: Normal pulses and intact distal pulses.     Heart sounds: Normal heart sounds.  Pulmonary:     Effort: Pulmonary effort is normal.  No respiratory distress.     Breath sounds: Normal breath sounds.  Abdominal:      General: Bowel sounds are normal. There is no distension.     Palpations: Abdomen is soft.     Tenderness: There is no abdominal tenderness.  Genitourinary:    Comments: foley Musculoskeletal:     Cervical back: Neck supple.     Right lower leg: Edema present.     Left lower leg: Edema present.     Comments: Right side weakness  Is able to move all extremities   3-4+ bilateral lower extremity edema    Lymphadenopathy:     Cervical: No cervical adenopathy.  Skin:    General: Skin is warm and dry.  Neurological:     Mental Status: She is alert. Mental status is at baseline.     Comments: 06-29-20: SLUMS: 9/30     Psychiatric:        Mood and Affect: Mood and affect and mood normal.      ASSESSMENT/ PLAN:  TODAY  1. Left cerebral middle artery stroke 2. Aortic atherosclerosis  3. Chronic diastolic congestive heart failure  Will continue current medications  will continue therapy as directed Will continue current plan of care.  Will continue to monitor her status   MD is aware of resident's narcotic use and is in agreement with current plan of care. We will attempt to wean resident as appropriate.  Ok Edwards NP [facility]  Contact [phone] Monday through Friday 8am- 5pm  After hours call [phone]

## 2020-07-13 NOTE — Progress Notes (Signed)
[facility] [address] [place]. [place] [address] [phone]       HOSPITAL FOLLOW UP NOTE  Sheila Ray Date of Birth:  10-Jun-1941 Medical Record Number:  [id number]   Reason for Referral:  hospital stroke follow up    SUBJECTIVE:   CHIEF COMPLAINT:  Chief Complaint  Patient presents with  . Follow-up    Treatment Rm with son(mark) Pt is well, little confusing since stroke. Had some confusing per son prior to this last stroke.    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  Personally reviewed hospitalization pertinent progress notes, lab work and imaging with summary provided.  Initially evaluated by Dr. Erlinda Hong with stroke work-up revealing left MCA and ACA distributions scattered infarcts in setting of left M2 occlusion s/p IR with TICI 2C reperfusion, infarct embolic likely from A. fib even on Xarelto.  Xarelto failure, transition to Eliquis 5 mg twice daily.  History of HTN with hypotension during admission on metoprolol and Zestril.  LDL 124 and initiated Zetia 10 mg daily with history of statin intolerance.  Controlled DM with A1c 6.4.  Other stroke risk factors include advanced age, obesity and prior stroke history.  Hospital course complicated by evidence of AKI likely postrenal with urinary retention and placement of Foley catheter 11/14, leukocytosis in setting of LLE cellulitis treated with Augmentin and hyponatremia.  Residual deficits of expressive aphasia, right hemiparesis and LLE weakness.  Evaluated by therapies and recommended discharge to CIR for ongoing therapy needs on 05/15/2020.  Stroke: Lt MCA scattered infarcts due to left M2 occlusion s/p HERD4Y -  embolic - likely from Afib even on Xarelto.  CT Head- No acute intracranial hemorrhage. No acute infarction identified. ASPECT score is 10.   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 No hemodynamically significant stenosis the neck.   CT Perfusion - No core infarction identified. 67 mL of penumbrain the posterior left MCA territory.  MRI head x 2 - Patchy acute ischemic infarcts involving the MCA and ACA distributions, predominantly cortical in nature.   MRA head x 2 - Previously identified occluded left M2 branch appears grossly patent status post revascularization.  EEG-left cortical dysfunction, no seizure  2D Echo - EF 55-60%  [employer] Virus 2 - negative  LDL - 124  HgbA1c - 6.4  VTE prophylaxis - [place] Heparin  Xarelto (rivaroxaban) dailyprior to admission,now on Eliquis 5 mg Bid due to Xarelto failure.  Therapy recommendations: CIR   Disposition: Discharge to CIR on 05/15/2020  Today, 07/13/2020, [name] is being seen for hospital follow-up accompanied by her son.  She was initially discharged home from CIR on 06/06/2020 after a 22-day stay and recovering well per son. She unfortunately returned to ED from home on 06/26/2020 for worsening confusion, hallucinations and agitation and diagnosed with acute metabolic encephalopathy due to hyponatremia and dehydration. Of note, recently treated for UTI in context of chronic Foley placement treated with abx. Per therapy recommendations, she was discharged to [facility] on 12/29 for rehab.  She continues to reside [facility].  Residual stroke deficits of cognitive impairment, expressive aphasia and gait impairment. She continues to work with PT/OT/SLP. 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 Per son, he has noticed improvement of her behaviors. Cognitive concerns started approximately 1 to 2 years ago associated  with paranoia. Cognition relatively stable over that time but greatly worsened after recent stroke. Denies new stroke/TIA symptoms. She has remained on Eliquis without bleeding or bruising. Remains on Zetia 10 mg daily without myalgias or side effects. Blood pressure today 89/55 and appears to typically run on lower side at facility per review of information provided. Foley catheter intact with scheduled follow-up with urology 1/19 and son is hopeful that catheter will be removed. He is concerned regarding ongoing use of Flomax and possible contributing factor to her dehydration on 12/27. Son is understandably very concerned and overwhelmed as patient previously independent maintaining all ADLs and IADLs. He is hopeful that she will at some point be able to return back home. No further concerns at this time.    ROS:   N/A due to dementia  PMH:  Past Medical History:  Diagnosis Date  . Arthritis   . Atrial fibrillation (HCC)   . Complication of anesthesia   . Diabetes mellitus without complication (HCC)   . Diverticulosis   . Fatigue   . Gastric polyp   . Goiter   . Hyperlipidemia   . Hypertension   . Hypothyroid    taken off of thyroid medication 2012014   . Obese   . Osteopenia   . PONV (postoperative nausea and vomiting)   . Postmenopausal   . Rosacea   . 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 Vitamin D deficiency     PSH:  Past Surgical History:  Procedure Laterality Date  . ABDOMINAL HYSTERECTOMY  1985  . FOOT SURGERY Right 08/2002  . IR CT HEAD LTD  05/03/2020  . IR PERCUTANEOUS ART THROMBECTOMY/INFUSION INTRACRANIAL INC DIAG ANGIO  05/03/2020  . RADIOLOGY WITH ANESTHESIA N/A 05/03/2020   Procedure: IR WITH ANESTHESIA;  Surgeon: Radiologist, Medication, MD;  Location: [facility] OR;  Service: Radiology;  Laterality: N/A;  . right knee arthroscopy   2013   . TONSILLECTOMY    . 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   Procedure: RIGHT TOTAL KNEE  ARTHROPLASTY;  Surgeon: Loanne Drilling, MD;  Location: [facility] ORS;  Service: Orthopedics;  Laterality: Right;    Social History:  Social History   Socioeconomic History  . Marital status: Single    Spouse name: Not on file  . Number of children: 1  . Years of education: 37  . Highest education level: 12th grade  Occupational History  . Occupation: retired  Tobacco Use  . Smoking status: Never Smoker  . Smokeless tobacco: Never Used  Vaping Use  . Vaping Use: Never used  Substance and Sexual Activity  . Alcohol use: No  . Drug use: No  . Sexual activity: Not Currently  Other Topics Concern  . Not on file  Social History Narrative  . Not on file   Social Determinants of Health   Financial Resource Strain: Low Risk   . Difficulty of Paying Living Expenses: Not hard at all  Food Insecurity: No Food Insecurity  . Worried About Programme researcher, broadcasting/film/video in the Last Year: Never true  . Ran Out of Food in the Last Year: Never true  Transportation Needs: No Transportation Needs  . Lack of Transportation (Medical): No  . Lack of Transportation (Non-Medical): No  Physical Activity: Insufficiently Active  . Days of Exercise per Week: 2  days  . Minutes of Exercise per Session: 20 min  Stress: No Stress Concern Present  . Feeling of Stress : Not at all  Social Connections: Moderately Integrated  . Frequency of Communication with Friends and Family: More than three times a week  . Frequency of Social Gatherings with Friends and Family: More than three times a week  . Attends Religious Services: More than 4 times per year  . Active Member of Clubs or Organizations: Yes  . Attends Archivist Meetings: More than 4 times per year  . Marital Status: Divorced  Human resources officer Violence: Not At Risk  . Fear of Current or Ex-Partner: No  . Emotionally Abused: No  . Physically Abused: No  . Sexually Abused: No    Family History:  Family History  Problem Relation Age of Onset  .  Osteoporosis Mother   . Alzheimer's disease Mother 36  . Arthritis Mother   . Cancer Father        Lung  . Arthritis Sister   . Obesity Sister   . Heart attack Brother 80  . Heart disease Son   . Arthritis Brother   . Arthritis Sister   . Cancer Sister        breast cancer    Medications:   Current Outpatient Medications on File Prior to Visit  Medication Sig Dispense Refill  . acetaminophen (TYLENOL) 325 MG tablet Take 2 tablets (650 mg total) by mouth every 4 (four) hours as needed for mild pain (or temp > 37.5 C (99.5 F)).    Marland Kitchen alendronate (FOSAMAX) 70 MG tablet Take 70 mg by mouth once a week. Take with a full glass of water on an empty stomach first thing in the am, 6 am    . apixaban (ELIQUIS) 5 MG TABS tablet Take 1 tablet (5 mg total) by mouth 2 (two) times daily. 60 tablet 0  . diltiazem (CARDIZEM CD) 240 MG 24 hr capsule Take 1 capsule (240 mg total) by mouth daily. 90 capsule 3  . ezetimibe (ZETIA) 10 MG tablet Take 1 tablet (10 mg total) by mouth daily. 30 tablet 0  . folic acid (FOLVITE) 1 MG tablet Take 1 mg by mouth daily. 8 am    . ipratropium (ATROVENT) 0.06 % nasal spray Place 1 spray into both nostrils 2 (two) times daily. 15 mL 12  . magnesium oxide (MAG-OX) 400 (241.3 Mg) MG tablet Take 1 tablet (400 mg total) by mouth daily. 30 tablet 0  . melatonin 3 MG TABS tablet Take 1 tablet (3 mg total) by mouth at bedtime. 30 tablet 0  . metoprolol succinate (TOPROL-XL) 100 MG 24 hr tablet Take 1 tablet (100 mg total) by mouth daily. Take with or immediately following a meal. 30 tablet 0  . NON FORMULARY Accu-check daily. Notify provide of results under 60 or over 400. Once A Day 06:00 AM    . QUEtiapine (SEROQUEL) 25 MG tablet Take 25 mg by mouth at bedtime. 8 pm    . saccharomyces boulardii (FLORASTOR) 250 MG capsule Take 1 capsule (250 mg total) by mouth 2 (two) times daily. 60 capsule 0  . senna-docusate (SENOKOT-S) 8.6-50 MG tablet Take 2 tablets by mouth 2 (two)  times daily.    . tamsulosin (FLOMAX) 0.4 MG CAPS capsule Take 1 capsule (0.4 mg total) by mouth daily. 30 capsule 11  . UNABLE TO FIND DIET: Nas and Consistent Carbohydrate     No current facility-administered medications on file  prior to visit.    Allergies:   Allergies  Allergen Reactions  . Crestor [Rosuvastatin] Other (See Comments)    Cramps  . Lipitor [Atorvastatin] Other (See Comments)    Cramps  . Pravastatin Other (See Comments)    Cramps   . Keflex [Cephalexin] Hives      OBJECTIVE:  Physical Exam  Vitals:   07/13/20 1006  BP: (!) 89/55  Pulse: (!) 51  Height: 5\' 4"  (1.626 m)   Body mass index is 28.73 kg/m. No exam data present  General: well developed, well nourished,  very pleasant elderly Caucasian female, seated, in no evident distress Head: head normocephalic and atraumatic.   Neck: supple with no carotid or supraclavicular bruits Cardiovascular: irregular rate and rhythm, no murmurs Musculoskeletal: no deformity; +3 BLE pitting edema Skin:  no rash/petichiae Vascular:  Normal pulses all extremities   Neurologic Exam Mental Status: Awake and fully alert.  Mild expressive aphasia. Able to follow simple step commands without difficulty. Oriented to place, time and birthdate. Recent and remote memory impaired. Attention span, concentration and fund of knowledge impaired. Providing majority of history. Mood and affect appropriate and cooperative with exam.  Cranial Nerves: Fundoscopic exam reveals sharp disc margins. Pupils equal, briskly reactive to light. Extraocular movements full without nystagmus. Visual fields full to confrontation. Hearing intact. Facial sensation intact. Face, tongue, palate moves normally and symmetrically.  Motor:  RUE: 4/5 with mildly weak grip strength RLE: 3/5 hip flexor; 4/5 knee extension and flexion, ankle dorsiflexion and plantarflexion LUE: 5/5 LLE: 4/5 hip flexor; 5/5 knee extension and flexion, ankle dorsiflexion and  plantarflexion Sensory.: intact to touch , pinprick , position and vibratory sensation.  Coordination: Rapid alternating movements normal in LUE. Finger-to-nose and heel-to-shin difficulty assessing as patient had difficulty understanding directions Gait and Station: Deferred Reflexes: 1+ and symmetric. Toes downgoing.     NIHSS  2 Modified Rankin  4      ASSESSMENT: Sheila Ray is a 80 y.o. year old female presented with sudden onset confusion and right facial droop on 05/03/2020 with stroke work-up revealing left MCA and PCA scattered infarcts in setting of left M2 occlusion s/p IR for with TICI 2C revascularization, infarct embolic likely secondary to A. fib even on Xarelto therefore transition to Eliquis. Vascular risk factors include A. fib, HTN, HLD, DM and prior left thalamic stroke.      PLAN:  1. L MCA and ACA stroke, embolic:  a. Residual deficit: Mild expressive aphasia, cognitive impairment, right hemiparesis and gait impairment. Continue to participate in PT/OT/SLP at SNF for hopeful ongoing improvement b. Continue Eliquis (apixaban) daily  and Zetia 10 mg daily for secondary stroke prevention.  Discussed secondary stroke prevention measures and importance of close PCP follow up for aggressive stroke risk factor management  2. A. Fib: CHA2DS2-VASc score of at least 8.  Recent stroke likely Xarelto failure currently on Eliquis 5 mg twice daily per PCP/cardiology 3. dementia with behaviors: Likely vascular with initial onset 1 to 2 years prior with prior stroke history worsened with recent stroke initially improving but decline with UTI and metabolic encephalopathy 2/2 dehydration hypokalemia on 12/27. SNF placed on Seroquel 25 mg nightly with improvement of behaviors and hallucinations. Due to time constraints, unable to fully discuss and assess cognition. Will plan on obtaining MMSE at follow-up visit. This will also allow for further recovery post stroke 4. HTN: BP goal  <130/90.  Stable on Cardizem, Lasix, and metoprolol per PCP 5. HLD: LDL goal <  70. Recent LDL 124 therefore placed on Zetia 10 mg daily during recent stroke admission.  History of statin intolerance to atorvastatin, pravastatin and Crestor. Request ongoing prescribing and lipid panel monitoring per PCP/facility 6. DMII: A1c goal<7.0. Recent A1c 6.4 on metformin per PCP.     Follow up in 3 months or call earlier if needed   CC:  [facility] provider: Dr. Lilly Cove, Theador Hawthorne, FNP    I spent a prolonged 59 minutes of face-to-face and non-face-to-face time with patient and son.  This included previsit chart review including hospitalization pertinent progress notes, lab work and imaging, lab review, study review, order entry, electronic health record documentation, patient education regarding recent stroke, residual deficits with worsening in setting of UTI and dehydration, importance of managing stroke risk factors and answered all other questions to patient satisfaction   Frann Rider, AGNP-BC  [facility] [address] [place], [place] [address]  Phone [phone] Fax [phone] Note: This document was prepared with digital dictation and possible smart phrase technology. Any transcriptional errors that result from this process are unintentional.

## 2020-07-14 ENCOUNTER — Encounter: Payer: Self-pay | Admitting: Adult Health

## 2020-07-14 ENCOUNTER — Non-Acute Institutional Stay (SKILLED_NURSING_FACILITY): Payer: Medicare Other | Admitting: Adult Health

## 2020-07-14 DIAGNOSIS — K5901 Slow transit constipation: Secondary | ICD-10-CM | POA: Diagnosis not present

## 2020-07-14 DIAGNOSIS — I5032 Chronic diastolic (congestive) heart failure: Secondary | ICD-10-CM | POA: Diagnosis not present

## 2020-07-14 DIAGNOSIS — I4821 Permanent atrial fibrillation: Secondary | ICD-10-CM | POA: Diagnosis not present

## 2020-07-14 DIAGNOSIS — F419 Anxiety disorder, unspecified: Secondary | ICD-10-CM | POA: Diagnosis not present

## 2020-07-14 NOTE — Progress Notes (Signed)
Location:  [facility] Room Number: 129-P Place of Service:  SNF (31)   CODE STATUS: Full Code   Allergies  Allergen Reactions  . Crestor [Rosuvastatin] Other (See Comments)    Cramps  . Lipitor [Atorvastatin] Other (See Comments)    Cramps  . Pravastatin Other (See Comments)    Cramps   . Keflex [Cephalexin] Hives    Chief Complaint  Patient presents with  . Short-Term Rehab          Chronic diastolic congestive heart failure: . Permanent atrial fibrillation: Slow transit constipation:   Weekly follow up for the first 30 days post hospitalization.     HPI:  She is a 80 year old short term rehab patient being seen for the management of her chronic illnesses:Chronic diastolic congestive heart failure: . Permanent atrial fibrillation: Slow transit constipation. Staff reports that she has become more anxious and nervous throughout the day. She is having confusion as well. No reports of uncontrolled pain; no reports of constipation.    Past Medical History:  Diagnosis Date  . Arthritis   . Atrial fibrillation ([place])   . Complication of anesthesia   . Diabetes mellitus without complication ([place])   . Diverticulosis   . Fatigue   . Gastric polyp   . Goiter   . Hyperlipidemia   . Hypertension   . Hypothyroid    taken off of thyroid medication 2012014   . Obese   . Osteopenia   . PONV (postoperative nausea and vomiting)   . Postmenopausal   . Rosacea   . Stroke ([place]) 01/15/2013   left thalamic  stroke, small vessel  . Vitamin D deficiency     Past Surgical History:  Procedure Laterality Date  . ABDOMINAL HYSTERECTOMY  1985  . FOOT SURGERY Right 08/2002  . IR CT HEAD LTD  05/03/2020  . IR PERCUTANEOUS ART THROMBECTOMY/INFUSION INTRACRANIAL INC DIAG ANGIO  05/03/2020  . RADIOLOGY WITH ANESTHESIA N/A 05/03/2020   Procedure: IR WITH ANESTHESIA;  Surgeon: Radiologist, Medication, MD;  Location: [facility];  Service: Radiology;  Laterality: N/A;  . right knee  arthroscopy   2013   . TONSILLECTOMY    . 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   Procedure: RIGHT TOTAL KNEE ARTHROPLASTY;  Surgeon: Gearlean Alf, MD;  Location: [facility];  Service: Orthopedics;  Laterality: Right;    Social History   Socioeconomic History  . Marital status: Single    Spouse name: Not on file  . Number of children: 1  . Years of education: 19  . Highest education level: 12th grade  Occupational History  . Occupation: retired  Tobacco Use  . Smoking status: Never Smoker  . Smokeless tobacco: Never Used  Vaping Use  . Vaping Use: Never used  Substance and Sexual Activity  . Alcohol use: No  . Drug use: No  . Sexual activity: Not Currently  Other Topics Concern  . Not on file  Social History Narrative  . Not on file   Social Determinants of Health   Financial Resource Strain: Low Risk   . Difficulty of Paying Living Expenses: Not hard at all  Food Insecurity: No Food Insecurity  . Worried About Charity fundraiser in the Last Year: Never true  . Ran Out of Food in the Last Year: Never true  Transportation Needs: No Transportation Needs  . Lack of Transportation (Medical): No  . Lack of Transportation (Non-Medical): No  Physical Activity: Insufficiently Active  .  Days of Exercise per Week: 2 days  . Minutes of Exercise per Session: 20 min  Stress: No Stress Concern Present  . Feeling of Stress : Not at all  Social Connections: Moderately Integrated  . Frequency of Communication with Friends and Family: More than three times a week  . Frequency of Social Gatherings with Friends and Family: More than three times a week  . Attends Religious Services: More than 4 times per year  . Active Member of Clubs or Organizations: Yes  . Attends Banker Meetings: More than 4 times per year  . Marital Status: Divorced  Catering manager Violence: Not At Risk  . Fear of Current or Ex-Partner: No  . Emotionally Abused: No  . Physically Abused: No  .  Sexually Abused: No   Family History  Problem Relation Age of Onset  . Osteoporosis Mother   . Alzheimer's disease Mother 73  . Arthritis Mother   . Cancer Father        Lung  . Arthritis Sister   . Obesity Sister   . Heart attack Brother 84  . Heart disease Son   . Arthritis Brother   . Arthritis Sister   . Cancer Sister        breast cancer      VITAL SIGNS BP 98/66   Pulse 73   Temp (!) 97.5 F (36.4 C)   Resp 20   Ht 5\' 4"  (1.626 m)   Wt 167 lb 1.6 oz (75.8 kg)   SpO2 93%   BMI 28.68 kg/m   Outpatient Encounter Medications as of 07/14/2020  Medication Sig  . acetaminophen (TYLENOL) 325 MG tablet Take 2 tablets (650 mg total) by mouth every 4 (four) hours as needed for mild pain (or temp > 37.5 C (99.5 F)).  Marland Kitchen alendronate (FOSAMAX) 70 MG tablet Take 70 mg by mouth once a week. Take with a full glass of water on an empty stomach first thing in the am, 6 am  . antiseptic oral rinse (BIOTENE) LIQD 30 mLs by Mouth Rinse route in the morning, at noon, and at bedtime.  Marland Kitchen apixaban (ELIQUIS) 5 MG TABS tablet Take 1 tablet (5 mg total) by mouth 2 (two) times daily.  Lucilla Lame [place]-Castor Oil (VENELEX) OINT Apply topically. ointment; - ; amt: 1 application; topical  . diltiazem (CARDIZEM CD) 240 MG 24 hr capsule Take 1 capsule (240 mg total) by mouth daily.  Marland Kitchen ezetimibe (ZETIA) 10 MG tablet Take 1 tablet (10 mg total) by mouth daily.  . folic acid (FOLVITE) 1 MG tablet Take 1 mg by mouth daily. 8 am  . furosemide (LASIX) 40 MG tablet Take 40 mg by mouth daily.  Marland Kitchen ipratropium (ATROVENT) 0.06 % nasal spray Place 1 spray into both nostrils 2 (two) times daily.  . magnesium oxide (MAG-OX) 400 (241.3 Mg) MG tablet Take 1 tablet (400 mg total) by mouth daily.  . melatonin 3 MG TABS tablet Take 1 tablet (3 mg total) by mouth at bedtime.  . metoprolol succinate (TOPROL-XL) 100 MG 24 hr tablet Take 1 tablet (100 mg total) by mouth daily. Take with or immediately following a meal.  .  metoprolol succinate (TOPROL-XL) 100 MG 24 hr tablet Take 100 mg by mouth daily. Take with or immediately following a meal.  . NON FORMULARY Accu-check daily. Notify provide of results under 60 or over 400. Once A Day 06:00 AM  . potassium chloride (KLOR-CON) 10 MEQ tablet Take 10 mEq  by mouth daily.  . QUEtiapine (SEROQUEL) 25 MG tablet Take 25 mg by mouth at bedtime. 8 pm  . saccharomyces boulardii (FLORASTOR) 250 MG capsule Take 1 capsule (250 mg total) by mouth 2 (two) times daily.  Marland Kitchen senna-docusate (SENOKOT-S) 8.6-50 MG tablet Take 2 tablets by mouth 2 (two) times daily.  . tamsulosin (FLOMAX) 0.4 MG CAPS capsule Take 1 capsule (0.4 mg total) by mouth daily.  Marland Kitchen UNABLE TO FIND DIET:Regular   No facility-administered encounter medications on file as of 07/14/2020.     SIGNIFICANT DIAGNOSTIC EXAMS  PREVIOUS  05-14-20: mri/mra of head:  1. Cortical restricted diffusion within the left temporal occipital region. Scattered foci of restricted diffusion are also seen in the left frontoparietal and insular regions and left amygdala. Findings are consistent with acute infarcts. No hemorrhagic transformation, significant mass effect or midline shift. 2. No proximal occlusion or stenosis in the anterior or posterior circulation.   05-14-20: renal ultrasound:  Foley catheter in place. Urinary bladder decompressed. No hydronephrosis. Small bilateral pleural effusions. No acute findings in the abdomen or pelvis.  06-15-20: ct of abdomen and pelvis:  Foley catheter in place. Urinary bladder decompressed. No hydronephrosis. Small bilateral pleural effusions. No acute findings in the abdomen or pelvis.  06-26-20: chest x-ray:  1. Slight decrease in bilateral pleural effusions, now small. Mild overlying bibasilar opacities, favor atelectasis. 2. Cardiomegaly and pulmonary vascular congestion.  06-26-20: ct of head:  1. No acute intracranial abnormality. 2. Chronic small vessel ischemic  changes and cerebral atrophy.  NO NEW EXAMS.     LABS REVIEWED PREVIOUS    05-04-20: chol 173; ldl 124; trig 65; hdl 36; hgb a1c 6.4 06-26-20: wbc 11.8; hgb 10.4; hct 34.5; mcv 77.2 plt 354; glucose 135; bun 16; creat 0.61; k+ 4.3; na++ 125; na 9.0 GFR >60; liver normal albumin 3.2 tsh 6.501 06-27-20: mag 1.9 06-28-20: wbc 8.1; hgb 9.0; hct 30.6; mcv 77.7 plt 294 glucose 75; bun 11; creat 0.51; k+ 3.9; na++ 131; ca 8.8 GFR>60 bit B 12: 5070; folate 5.0  07-03-20: glucose 125; bun 19; creat 0.78; k+ 4.3; na++ 121; ca 8.6 GFR >60 07-04-20: glucose 110; bun 20; creat 0.84; k+ 4.0; na++ 122; ca 8.9 GFR >60 07-05-20: glucose 100; bun 16; creat 0.73; k+ 4.0; na++ 125; ca 8.5; GFR >60 07-06-20: wbc 11.9 hgb 9.3; hct 31.0; mcv 75.6 plt 341; glucose 85; bun 18; creat 0.71; k+ 3.9; na++ 128; ca 8.8 liver normal albumin 3.1; GFR >60   NO NEW LABS.    Review of Systems  Constitutional: Negative for malaise/fatigue.  Respiratory: Negative for cough and shortness of breath.   Cardiovascular: Negative for chest pain, palpitations and leg swelling.  Gastrointestinal: Negative for abdominal pain, constipation and heartburn.  Musculoskeletal: Negative for back pain, joint pain and myalgias.  Skin: Negative.   Neurological: Negative for dizziness.  Psychiatric/Behavioral: The patient is nervous/anxious. The patient does not have insomnia.    Marland Kitchen   Physical Exam Constitutional:      General: She is not in acute distress.    Appearance: She is well-developed and well-nourished. She is not diaphoretic.  Neck:     Thyroid: No thyromegaly.  Cardiovascular:     Rate and Rhythm: Normal rate and regular rhythm.     Pulses: Normal pulses and intact distal pulses.     Heart sounds: Normal heart sounds.  Pulmonary:     Effort: Pulmonary effort is normal. No respiratory distress.     Breath sounds:  Normal breath sounds.  Abdominal:     General: Bowel sounds are normal. There is no distension.     Palpations:  Abdomen is soft.     Tenderness: There is no abdominal tenderness.  Genitourinary:    Comments: Foley  Musculoskeletal:     Cervical back: Neck supple.     Right lower leg: Edema present.     Left lower leg: Edema present.     Comments: Right side weakness  Is able to move all extremities   2-3+ bilateral lower extremity edema     Lymphadenopathy:     Cervical: No cervical adenopathy.  Skin:    General: Skin is warm and dry.  Neurological:     Mental Status: She is alert. Mental status is at baseline.     Comments: 06-29-20: SLUMS: 9/30      Psychiatric:        Mood and Affect: Mood and affect and mood normal.     ASSESSMENT/ PLAN:  TODAY  1. Chronic diastolic congestive heart failure: is stable EF 55-60% (05-04-20): will continue toprol xl 100 mg daily; lasix 40 mg daily with k+ 10 meq daily   2. Permanent atrial fibrillation: heart rate is stable will continue toprol xl 100 mg daily and diltiazem cd 140 mg daily for rate control and eliquis 5 mg daily   3. Slow transit constipation: is stable will continue senna s 2 tabs twice daily and florastor twice daily   4. Anxiety: is worse will begin zoloft 50 mg daily and will monitor her status.    PREVIOUS   5. Hyperlipidemia associated with type 2 diabetes mellitus: is stable LDL 124 will continue zetia 10 mg daily is unable to tolerate statins.   6 Hypertension associated with diabetes: is stable b/p 98/65 will continue toprol xl 100 mg daily diltiazem cd 240 mg daily   7. Hypothyroidism unspecified type: is stable tsh 6.501 will monitor will check free hormones   8. 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   9. Age related osteoporosis without pathologic fracture: is stable will continue fosamax 70 mh weekly   10. Urinary retention/neurogenic bladder: has long term  foley will continue flomax 0.4 mg daily will follow up with urology  11. Hypomagnesemia: is stable mag 1.9 will continue mag ox daily   12. Aortic atherosclerosis (CT 06-15-20) will monitor   13. Aphasia as late effect of cerebrovascular accident: is without change: being seen by ST  14. Vascular dementia with behavioral disturbance: is without change; weight is 167 pounds will monitor her status.   15. Rhinorrhea: is stable will continue atrovent nasal spray twice daily   16. Chronic anemia: is stable hgb 9.0 will monitor  17. Folate deficiency: is without change level is 5.0 continue  folic acid 1 mg daily   18. 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 Hyponatremia: is without change na++ 128 will monitor   20. 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       MD is aware of resident's narcotic use and is in agreement with current plan of care. We will attempt to wean resident as appropriate.  Ok Edwards NP [facility]  Contact [phone] Monday through Friday 8am- 5pm  After hours call [phone]

## 2020-07-15 NOTE — Progress Notes (Signed)
I agree with the above plan 

## 2020-07-18 ENCOUNTER — Telehealth: Payer: Self-pay

## 2020-07-18 ENCOUNTER — Other Ambulatory Visit (HOSPITAL_COMMUNITY)
Admission: RE | Admit: 2020-07-18 | Discharge: 2020-07-18 | Disposition: A | Discharge status: Home / Self Care | Payer: Medicare Other | Source: Skilled Nursing Facility | Attending: Adult Health | Admitting: Adult Health

## 2020-07-18 DIAGNOSIS — I152 Hypertension secondary to endocrine disorders: Secondary | ICD-10-CM | POA: Diagnosis not present

## 2020-07-18 LAB — BASIC METABOLIC PANEL
Anion gap: 10 (ref 5–15)
BUN: 23 mg/dL (ref 8–23)
CO2: 21 mmol/L — ABNORMAL LOW (ref 22–32)
Calcium: 8.8 mg/dL — ABNORMAL LOW (ref 8.9–10.3)
Chloride: 102 mmol/L (ref 98–111)
Creatinine, Ser: 0.78 mg/dL (ref 0.44–1.00)
GFR, Estimated: >60 mL/min (ref >60)
Glucose, Bld: 124 mg/dL — ABNORMAL HIGH (ref 70–99)
Potassium: 3.8 mmol/L (ref 3.5–5.1)
Sodium: 133 mmol/L — ABNORMAL LOW (ref 135–145)

## 2020-07-18 NOTE — Telephone Encounter (Signed)
-----   Message from Cleon Gustin, MD sent at 07/06/2020 10:04 AM EST ----- Diflucan 100mg  daily for 5 days ----- Message ----- From: Valentina Lucks, LPN Sent: 12/01/2295  98:92 AM EST To: Cleon Gustin, MD  Pls review.

## 2020-07-18 NOTE — Telephone Encounter (Signed)
Called pt home number. Son answered saying pt now at [facility]. [facility] and Notified nurse for pt. Asked for a copy of order. Copy of task faxed to [facility].

## 2020-07-19 ENCOUNTER — Encounter: Payer: Self-pay | Admitting: Urology

## 2020-07-19 ENCOUNTER — Ambulatory Visit (INDEPENDENT_AMBULATORY_CARE_PROVIDER_SITE_OTHER): Payer: Medicare Other | Admitting: Urology

## 2020-07-19 VITALS — BP 89/57 | HR 59 | Temp 98.0°F | Ht 64.0 in | Wt 167.1 lb

## 2020-07-19 DIAGNOSIS — R339 Retention of urine, unspecified: Secondary | ICD-10-CM

## 2020-07-19 DIAGNOSIS — G9341 Metabolic encephalopathy: Secondary | ICD-10-CM | POA: Diagnosis not present

## 2020-07-19 DIAGNOSIS — Z1159 Encounter for screening for other viral diseases: Secondary | ICD-10-CM | POA: Diagnosis not present

## 2020-07-19 NOTE — Progress Notes (Signed)
Urological Symptom Review  Patient is experiencing the following symptoms: Pt has cath   Review of Systems  Gastrointestinal (upper)  : Negative for upper GI symptoms  Gastrointestinal (lower) : Negative for lower GI symptoms  Constitutional : Negative for symptoms  Skin: Negative for skin symptoms  Eyes: Negative for eye symptoms  Ear/Nose/Throat : Negative for Ear/Nose/Throat symptoms  Hematologic/Lymphatic: Negative for Hematologic/Lymphatic symptoms  Cardiovascular : Negative for cardiovascular symptoms  Respiratory : Negative for respiratory symptoms  Endocrine: Negative for endocrine symptoms  Musculoskeletal: Negative for musculoskeletal symptoms  Neurological: Negative for neurological symptoms  Psychologic: Negative for psychiatric symptoms

## 2020-07-19 NOTE — Progress Notes (Signed)
07/19/2020 11:41 AM   Bobbye L Meda Coffee April 08, 1941 [id number]  Referring provider: Sharion Balloon, [address],  [place] [address]  Followup urinary retention  HPI: Ms Bonfiglio is a 80yo here for followup for urinary retention. She started flomax 3 weeks ago. She has intermittent bladder spasms which cause intermittent pain. No hematuria. No UTIs. She is requesting the foley be removed today.   PMH: Past Medical History:  Diagnosis Date  . Arthritis   . Atrial fibrillation ([place])   . Complication of anesthesia   . Diabetes mellitus without complication ([place])   . Diverticulosis   . Fatigue   . Gastric polyp   . Goiter   . Hyperlipidemia   . Hypertension   . Hypothyroid    taken off of thyroid medication 2012014   . Obese   . Osteopenia   . PONV (postoperative nausea and vomiting)   . Postmenopausal   . Rosacea   . Stroke ([place]) 01/15/2013   left thalamic  stroke, small vessel  . Vitamin D deficiency     Surgical History: Past Surgical History:  Procedure Laterality Date  . ABDOMINAL HYSTERECTOMY  1985  . FOOT SURGERY Right 08/2002  . IR CT HEAD LTD  05/03/2020  . IR PERCUTANEOUS ART THROMBECTOMY/INFUSION INTRACRANIAL INC DIAG ANGIO  05/03/2020  . RADIOLOGY WITH ANESTHESIA N/A 05/03/2020   Procedure: IR WITH ANESTHESIA;  Surgeon: Radiologist, Medication, MD;  Location: [facility];  Service: Radiology;  Laterality: N/A;  . right knee arthroscopy   2013   . TONSILLECTOMY    . 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   Procedure: RIGHT TOTAL KNEE ARTHROPLASTY;  Surgeon: Gearlean Alf, MD;  [facility];  Service: Orthopedics;  Laterality: Right;    Home Medications:  Allergies as of 07/19/2020      Reactions   Crestor [rosuvastatin] Other (See Comments)   Cramps   Lipitor [atorvastatin] Other (See Comments)   Cramps   Pravastatin Other (See Comments)   Cramps    Keflex [cephalexin] Hives      Medication List    Notice   This visit is during an  admission. Changes to the med list made in this visit will be reflected in the After Visit Summary of the admission.     Allergies:  Allergies  Allergen Reactions  . Crestor [Rosuvastatin] Other (See Comments)    Cramps  . Lipitor [Atorvastatin] Other (See Comments)    Cramps  . Pravastatin Other (See Comments)    Cramps   . Keflex [Cephalexin] Hives    Family History: Family History  Problem Relation Age of Onset  . Osteoporosis Mother   . Alzheimer's disease Mother 8  . Arthritis Mother   . Cancer Father        Lung  . Arthritis Sister   . Obesity Sister   . Heart attack Brother 14  . Heart disease Son   . Arthritis Brother   . Arthritis Sister   . Cancer Sister        breast cancer    Social History:  reports that she has never smoked. She has never used smokeless tobacco. She reports that she does not drink alcohol and does not use drugs.  ROS: All other review of systems were reviewed and are negative except what is noted above in HPI  Physical Exam: BP (!) 89/57   Pulse (!) 59   Temp 98 F (36.7 C)   Ht 5\' 4"  (1.626 m)  Wt 167 lb 1.6 oz (75.8 kg)   BMI 28.68 kg/m   Constitutional:  Alert and oriented, No acute distress. HEENT: [place] AT, moist mucus membranes.  Trachea midline, no masses. Cardiovascular: No clubbing, cyanosis, or edema. Respiratory: Normal respiratory effort, no increased work of breathing. GI: Abdomen is soft, nontender, nondistended, no abdominal masses GU: No CVA tenderness.  Lymph: No cervical or inguinal lymphadenopathy. Skin: No rashes, bruises or suspicious lesions. Neurologic: Grossly intact, no focal deficits, moving all 4 extremities. Psychiatric: Normal mood and affect.  Laboratory Data: Lab Results  Component Value Date   WBC 11.9 (H) 07/06/2020   HGB 9.3 (L) 07/06/2020   HCT 31.0 (L) 07/06/2020   MCV 75.6 (L) 07/06/2020   PLT 341 07/06/2020    Lab Results  Component Value Date   CREATININE 0.78 07/18/2020     No results found for: PSA  No results found for: TESTOSTERONE  Lab Results  Component Value Date   HGBA1C 6.4 (H) 05/04/2020    Urinalysis    Component Value Date/Time   COLORURINE YELLOW 06/26/2020 1012   APPEARANCEUR CLOUDY (A) 06/26/2020 1012   LABSPEC 1.015 06/26/2020 1012   PHURINE 5.0 06/26/2020 1012   GLUCOSEU NEGATIVE 06/26/2020 1012   HGBUR SMALL (A) 06/26/2020 1012   BILIRUBINUR NEGATIVE 06/26/2020 1012   KETONESUR 5 (A) 06/26/2020 1012   PROTEINUR 100 (A) 06/26/2020 1012   UROBILINOGEN 0.2 07/19/2014 0950   NITRITE NEGATIVE 06/26/2020 1012   LEUKOCYTESUR LARGE (A) 06/26/2020 1012    Lab Results  Component Value Date   LABMICR 18.0 10/29/2018   BACTERIA RARE (A) 06/26/2020    Pertinent Imaging:  No results found for this or any previous visit.  No results found for this or any previous visit.  No results found for this or any previous visit.  No results found for this or any previous visit.  Results for orders placed during the hospital encounter of 05/03/20  US RENAL  Narrative CLINICAL DATA:  Acute renal insufficiency  EXAM: RENAL / URINARY TRACT ULTRASOUND COMPLETE  COMPARISON:  None.  FINDINGS: Right Kidney:  Renal measurements: 10.3 by 6.3 x 4.7 cm = volume: 157.6 mL. Echogenicity within normal limits. No mass or hydronephrosis visualized. Mild prominence of the right renal pelvis is nonspecific.  Left Kidney:  Renal measurements: 10.2 x 6.1 by 4.9 cm = volume: 161.3 mL. Echogenicity within normal limits. No mass or hydronephrosis visualized.  Bladder:  Decompressed and not evaluated.  Other:  None.  IMPRESSION: 1. Mild distension of the right renal pelvis without frank hydronephrosis. Otherwise unremarkable exam.   Electronically Signed By: Randa Ngo M.D. On: 05/14/2020 15:47  No results found for this or any previous visit.  No results found for this or any previous visit.  No results found for this  or any previous visit.   Assessment & Plan:    1. Urinary retention -Voiding trial passed today -Continue flomax 0.4mg  -RCT 4 weeks with PVR   Return in about 4 weeks (around 08/16/2020) for PVR.  Nicolette Bang, MD  [facility] [place]

## 2020-07-19 NOTE — Progress Notes (Signed)
Fill and Pull Catheter Removal  Patient is present today for a catheter removal.  Patient was cleaned and prepped in a sterile fashion 176ml of sterile water/ saline was instilled into the bladder when the patient felt the urge to urinate. 9ml of water was then drained from the balloon.  A 16FR foley cath was removed from the bladder no complications were noted .  Patient as then given some time to void on their own.  Patient can void  97ml on their own after some time.  Patient tolerated well.  Performed by: Jorge Ny

## 2020-07-19 NOTE — Progress Notes (Signed)
Location:  [facility] Room Number: 222 Place of Service:  SNF (31)   CODE STATUS: full code   Allergies  Allergen Reactions  . Crestor [Rosuvastatin] Other (See Comments)    Cramps  . Lipitor [Atorvastatin] Other (See Comments)    Cramps  . Pravastatin Other (See Comments)    Cramps   . Keflex [Cephalexin] Hives    Chief Complaint  Patient presents with  . Acute Visit    Edema     HPI:  Staff report that she is developing lower extremity edema. The edema is significant . She denies any pain; denies any cough; chest pain or shortness of breath. She does have a history of diastolic congestive heart failure.   Past Medical History:  Diagnosis Date  . Arthritis   . Atrial fibrillation (HCC)   . Complication of anesthesia   . Diabetes mellitus without complication (HCC)   . Diverticulosis   . Fatigue   . Gastric polyp   . Goiter   . Hyperlipidemia   . Hypertension   . Hypothyroid    taken off of thyroid medication 2012014   . Obese   . Osteopenia   . PONV (postoperative nausea and vomiting)   . Postmenopausal   . Rosacea   . 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 Vitamin D deficiency     Past Surgical History:  Procedure Laterality Date  . ABDOMINAL HYSTERECTOMY  1985  . FOOT SURGERY Right 08/2002  . IR CT HEAD LTD  05/03/2020  . IR PERCUTANEOUS ART THROMBECTOMY/INFUSION INTRACRANIAL INC DIAG ANGIO  05/03/2020  . RADIOLOGY WITH ANESTHESIA N/A 05/03/2020   Procedure: IR WITH ANESTHESIA;  Surgeon: Radiologist, Medication, MD;  Location: [facility] OR;  Service: Radiology;  Laterality: N/A;  . right knee arthroscopy   2013   . TONSILLECTOMY    . 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   Procedure: RIGHT TOTAL KNEE ARTHROPLASTY;  Surgeon: Loanne Drilling, MD;  Location: [facility] ORS;  Service: Orthopedics;  Laterality: Right;    Social History   Socioeconomic History  . Marital status: Single    Spouse name: Not on file   . Number of children: 1  . Years of education: 73  . Highest education level: 12th grade  Occupational History  . Occupation: retired  Tobacco Use  . Smoking status: Never Smoker  . Smokeless tobacco: Never Used  Vaping Use  . Vaping Use: Never used  Substance and Sexual Activity  . Alcohol use: No  . Drug use: No  . Sexual activity: Not Currently  Other Topics Concern  . Not on file  Social History Narrative  . Not on file   Social Determinants of Health   Financial Resource Strain: Low Risk   . Difficulty of Paying Living Expenses: Not hard at all  Food Insecurity: No Food Insecurity  . Worried About Programme researcher, broadcasting/film/video in the Last Year: Never true  . Ran Out of Food in the Last Year: Never true  Transportation Needs: No Transportation Needs  . Lack of Transportation (Medical): No  . Lack of Transportation (Non-Medical): No  Physical Activity: Insufficiently Active  . Days of Exercise per Week: 2 days  . Minutes of Exercise per Session: 20 min  Stress: No Stress Concern Present  . Feeling of Stress : Not at all  Social Connections: Moderately Integrated  . Frequency of Communication with Friends and Family: More than three times  a week  . Frequency of Social Gatherings with Friends and Family: More than three times a week  . Attends Religious Services: More than 4 times per year  . Active Member of Clubs or Organizations: Yes  . Attends Archivist Meetings: More than 4 times per year  . Marital Status: Divorced  Human resources officer Violence: Not At Risk  . Fear of Current or Ex-Partner: No  . Emotionally Abused: No  . Physically Abused: No  . Sexually Abused: No   Family History  Problem Relation Age of Onset  . Osteoporosis Mother   . Alzheimer's disease Mother 64  . Arthritis Mother   . Cancer Father        Lung  . Arthritis Sister   . Obesity Sister   . Heart attack Brother 47  . Heart disease Son   . Arthritis Brother   . Arthritis Sister   .  Cancer Sister        breast cancer      VITAL SIGNS BP (!) 100/56   Pulse 70   Temp 98 F (36.7 C)   Ht 5\' 4"  (1.626 m)   Wt 167 lb (75.8 kg)   BMI 28.67 kg/m   Outpatient Encounter Medications as of 07/11/2020  Medication Sig  . acetaminophen (TYLENOL) 325 MG tablet Take 2 tablets (650 mg total) by mouth every 4 (four) hours as needed for mild pain (or temp > 37.5 C (99.5 F)).  Marland Kitchen alendronate (FOSAMAX) 70 MG tablet Take 70 mg by mouth once a week. Take with a full glass of water on an empty stomach first thing in the am, 6 am  . apixaban (ELIQUIS) 5 MG TABS tablet Take 1 tablet (5 mg total) by mouth 2 (two) times daily.  Marland Kitchen diltiazem (CARDIZEM CD) 240 MG 24 hr capsule Take 1 capsule (240 mg total) by mouth daily.  Marland Kitchen ezetimibe (ZETIA) 10 MG tablet Take 1 tablet (10 mg total) by mouth daily.  . folic acid (FOLVITE) 1 MG tablet Take 1 mg by mouth daily. 8 am  . ipratropium (ATROVENT) 0.06 % nasal spray Place 1 spray into both nostrils 2 (two) times daily.  . magnesium oxide (MAG-OX) 400 (241.3 Mg) MG tablet Take 1 tablet (400 mg total) by mouth daily.  . melatonin 3 MG TABS tablet Take 1 tablet (3 mg total) by mouth at bedtime.  . metoprolol succinate (TOPROL-XL) 100 MG 24 hr tablet Take 1 tablet (100 mg total) by mouth daily. Take with or immediately following a meal.  . NON FORMULARY Accu-check daily. Notify provide of results under 60 or over 400. Once A Day 06:00 AM  . QUEtiapine (SEROQUEL) 25 MG tablet Take 25 mg by mouth at bedtime. 8 pm  . saccharomyces boulardii (FLORASTOR) 250 MG capsule Take 1 capsule (250 mg total) by mouth 2 (two) times daily.  Marland Kitchen senna-docusate (SENOKOT-S) 8.6-50 MG tablet Take 2 tablets by mouth 2 (two) times daily.  . tamsulosin (FLOMAX) 0.4 MG CAPS capsule Take 1 capsule (0.4 mg total) by mouth daily.  Marland Kitchen UNABLE TO FIND DIET:Regular   No facility-administered encounter medications on file as of 07/11/2020.     SIGNIFICANT DIAGNOSTIC  EXAMS   PREVIOUS  05-14-20: mri/mra of head:  1. Cortical restricted diffusion within the left temporal occipital region. Scattered foci of restricted diffusion are also seen in the left frontoparietal and insular regions and left amygdala. Findings are consistent with acute infarcts. No hemorrhagic transformation, significant mass effect or  midline shift. 2. No proximal occlusion or stenosis in the anterior or posterior circulation.   05-14-20: renal ultrasound:  Foley catheter in place. Urinary bladder decompressed. No hydronephrosis. Small bilateral pleural effusions. No acute findings in the abdomen or pelvis.  06-15-20: ct of abdomen and pelvis:  Foley catheter in place. Urinary bladder decompressed. No hydronephrosis. Small bilateral pleural effusions. No acute findings in the abdomen or pelvis.  06-26-20: chest x-ray:  1. Slight decrease in bilateral pleural effusions, now small. Mild overlying bibasilar opacities, favor atelectasis. 2. Cardiomegaly and pulmonary vascular congestion.  06-26-20: ct of head:  1. No acute intracranial abnormality. 2. Chronic small vessel ischemic changes and cerebral atrophy.  NO NEW EXAMS.     LABS REVIEWED PREVIOUS    05-04-20: chol 173; ldl 124; trig 65; hdl 36; hgb a1c 6.4 06-26-20: wbc 11.8; hgb 10.4; hct 34.5; mcv 77.2 plt 354; glucose 135; bun 16; creat 0.61; k+ 4.3; na++ 125; na 9.0 GFR >60; liver normal albumin 3.2 tsh 6.501 06-27-20: mag 1.9 06-28-20: wbc 8.1; hgb 9.0; hct 30.6; mcv 77.7 plt 294 glucose 75; bun 11; creat 0.51; k+ 3.9; na++ 131; ca 8.8 GFR>60 bit B 12: 5070; folate 5.0  07-03-20: glucose 125; bun 19; creat 0.78; k+ 4.3; na++ 121; ca 8.6 GFR >60 07-04-20: glucose 110; bun 20; creat 0.84; k+ 4.0; na++ 122; ca 8.9 GFR >60 07-05-20: glucose 100; bun 16; creat 0.73; k+ 4.0; na++ 125; ca 8.5; GFR >60 07-06-20: wbc 11.9 hgb 9.3; hct 31.0; mcv 75.6 plt 341; glucose 85; bun 18; creat 0.71; k+ 3.9; na++ 128; ca 8.8 liver normal  albumin 3.1; GFR >60   NO NEW LABS    Review of Systems  Constitutional: Negative for malaise/fatigue.  Respiratory: Negative for cough and shortness of breath.   Cardiovascular: Positive for leg swelling. Negative for chest pain and palpitations.  Gastrointestinal: Negative for abdominal pain, constipation and heartburn.  Musculoskeletal: Negative for back pain, joint pain and myalgias.  Skin: Negative.   Neurological: Negative for dizziness.  Psychiatric/Behavioral: The patient is not nervous/anxious.     Physical Exam Constitutional:      General: She is not in acute distress.    Appearance: She is well-developed and well-nourished. She is not diaphoretic.  Neck:     Thyroid: No thyromegaly.  Cardiovascular:     Rate and Rhythm: Normal rate and regular rhythm.     Pulses: Normal pulses and intact distal pulses.     Heart sounds: Normal heart sounds.  Pulmonary:     Effort: Pulmonary effort is normal. No respiratory distress.     Breath sounds: Normal breath sounds.  Abdominal:     General: Bowel sounds are normal. There is no distension.     Palpations: Abdomen is soft.     Tenderness: There is no abdominal tenderness.  Genitourinary:    Comments: foley Musculoskeletal:        General: Normal range of motion.     Cervical back: Neck supple.     Right lower leg: Edema present.     Left lower leg: Edema present.     Comments: Right side weakness  Is able to move all extremities   4+ bilateral lower extremity edema   Lymphadenopathy:     Cervical: No cervical adenopathy.  Skin:    General: Skin is warm and dry.  Neurological:     Mental Status: She is alert. Mental status is at baseline.     Comments: 06-29-20: SLUMS: 9/30  Psychiatric:        Mood and Affect: Mood and affect and mood normal.       ASSESSMENT/ PLAN:  TODAY  1. Chronic diastolic congestive heart failure; is worse: EF 55-60% (05-04-20); will continue toprol xl 100 mg daily will begin lasix  40 mg daily with k+ 10 meq daily; will repeat BMP on 07-18-20     MD is aware of resident's narcotic use and is in agreement with current plan of care. We will attempt to wean resident as appropriate.  Ok Edwards NP [facility]  Contact [phone] Monday through Friday 8am- 5pm  After hours call [phone]

## 2020-07-19 NOTE — Patient Instructions (Signed)
Acute Urinary Retention, Female  Acute urinary retention is when a person cannot pee (urinate) at all, or can only pee a little. This can come on all of a sudden. If it is not treated, it can lead to kidney problems or other serious problems. What are the causes?  A problem with the tube that drains the bladder (urethra).  Problems with the nerves in the bladder.  The organs in the area between your hip bones (pelvis) slipping out of place (prolapse).  Tumors.  The birth of a baby through the vagina.  An infection.  Having trouble pooping (constipation).  Certain medicines. What increases the risk? Women over age 50 are more at risk. Other conditions also can increase risk. These include:  Diseases, such as multiple sclerosis.  Injury to the spinal cord.  Diabetes.  A condition that affects the way the brain works, such as dementia.  Holding back urine due to trauma or because you do not want to use the bathroom.  History of not being able to pee or peeing too little.  Having had surgery in the area between your hip bones. What are the signs or symptoms?  Trouble peeing.  Pain in the lower belly. How is this treated? Treatment for this condition may include:  Medicines.  Placing a thin, germ-free tube (catheter) into the bladder to drain pee out of the body.  Therapy to treat mental health conditions.  Treatment for conditions that may cause this. If needed, you may be treated in the hospital for kidney problems or to manage other problems. Follow these instructions at home: Medicines  Take over-the-counter and prescription medicines only as told by your doctor. Ask your doctor what medicines you should stay away from.  If you were given an antibiotic medicine, take it as told by your doctor. Do not stop taking it even if you start to feel better. General instructions  Do not smoke or use any products that contain nicotine or tobacco. If you need help  quitting, ask your doctor.  Drink enough fluid to keep your pee pale yellow.  If you were sent home with a tube that drains the bladder, take care of it as told by your doctor.  Watch for changes in your symptoms. Tell your doctor about them.  If told, keep track of changes in your blood pressure at home. Tell your doctor about them.  Keep all follow-up visits. Contact a doctor if:  You have spasms in your bladder that you cannot stop.  You leak pee when you have spasms. Get help right away if:  You have chills or a fever.  You have blood in your pee.  You have a tube that drains pee from the bladder and these things happen: ? The tube stops draining pee. ? The tube falls out. Summary  Acute urinary retention is when you cannot pee at all or you pee too little.  If this is not treated, it can cause kidney problems or other serious problems.  If you were sent home with a tube (catheter) that drains pee from the bladder, take care of it as told by your doctor.  Watch for changes in your symptoms. Tell your doctor about them. This information is not intended to replace advice given to you by your health care provider. Make sure you discuss any questions you have with your health care provider. Document Revised: 03/08/2020 Document Reviewed: 03/08/2020 Elsevier Patient Education  2021 Elsevier Inc.  

## 2020-07-21 ENCOUNTER — Encounter: Payer: Self-pay | Admitting: Adult Health

## 2020-07-21 ENCOUNTER — Non-Acute Institutional Stay (SKILLED_NURSING_FACILITY): Payer: Medicare Other | Admitting: Adult Health

## 2020-07-21 ENCOUNTER — Non-Acute Institutional Stay: Payer: Self-pay | Admitting: Adult Health

## 2020-07-21 ENCOUNTER — Other Ambulatory Visit (HOSPITAL_COMMUNITY)
Admission: RE | Admit: 2020-07-21 | Discharge: 2020-07-21 | Disposition: A | Discharge status: Home / Self Care | Payer: Medicare Other | Source: Skilled Nursing Facility | Attending: Adult Health | Admitting: Adult Health

## 2020-07-21 DIAGNOSIS — E785 Hyperlipidemia, unspecified: Secondary | ICD-10-CM

## 2020-07-21 DIAGNOSIS — U071 COVID-19: Secondary | ICD-10-CM

## 2020-07-21 DIAGNOSIS — E119 Type 2 diabetes mellitus without complications: Secondary | ICD-10-CM | POA: Diagnosis not present

## 2020-07-21 DIAGNOSIS — I5032 Chronic diastolic (congestive) heart failure: Secondary | ICD-10-CM

## 2020-07-21 DIAGNOSIS — E1169 Type 2 diabetes mellitus with other specified complication: Secondary | ICD-10-CM

## 2020-07-21 DIAGNOSIS — F419 Anxiety disorder, unspecified: Secondary | ICD-10-CM | POA: Insufficient documentation

## 2020-07-21 DIAGNOSIS — I152 Hypertension secondary to endocrine disorders: Secondary | ICD-10-CM

## 2020-07-21 DIAGNOSIS — E1159 Type 2 diabetes mellitus with other circulatory complications: Secondary | ICD-10-CM

## 2020-07-21 LAB — D-DIMER, QUANTITATIVE: D-Dimer, Quant: 0.99 ug/mL-FEU — ABNORMAL HIGH (ref 0.00–0.50)

## 2020-07-21 LAB — C-REACTIVE PROTEIN: CRP: 0.7 mg/dL (ref <1.0)

## 2020-07-21 NOTE — Progress Notes (Signed)
Location:  [facility] Room Number: 129/P Place of Service:  SNF (31)   CODE STATUS: Full Code  Allergies  Allergen Reactions  . Crestor [Rosuvastatin] Other (See Comments)    Cramps  . Lipitor [Atorvastatin] Other (See Comments)    Cramps  . Pravastatin Other (See Comments)    Cramps   . Keflex [Cephalexin] Hives    Chief Complaint  Patient presents with  . Acute Visit    Covid    HPI:  She has been diagnosed with covid on a random test. There are no reports of body aches; no changes in appetite; no fever; no reports of loss of smell or taste. She will need zinc vit d and vit c.   Past Medical History:  Diagnosis Date  . Arthritis   . Atrial fibrillation (HCC)   . Complication of anesthesia   . Diabetes mellitus without complication (HCC)   . Diverticulosis   . Fatigue   . Gastric polyp   . Goiter   . Hyperlipidemia   . Hypertension   . Hypothyroid    taken off of thyroid medication 2012014   . Obese   . Osteopenia   . PONV (postoperative nausea and vomiting)   . Postmenopausal   . Rosacea   . 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 Vitamin D deficiency     Past Surgical History:  Procedure Laterality Date  . ABDOMINAL HYSTERECTOMY  1985  . FOOT SURGERY Right 08/2002  . IR CT HEAD LTD  05/03/2020  . IR PERCUTANEOUS ART THROMBECTOMY/INFUSION INTRACRANIAL INC DIAG ANGIO  05/03/2020  . RADIOLOGY WITH ANESTHESIA N/A 05/03/2020   Procedure: IR WITH ANESTHESIA;  Surgeon: Radiologist, Medication, MD;  Location: [facility] OR;  Service: Radiology;  Laterality: N/A;  . right knee arthroscopy   2013   . TONSILLECTOMY    . 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   Procedure: RIGHT TOTAL KNEE ARTHROPLASTY;  Surgeon: Loanne Drilling, MD;  Location: [facility] ORS;  Service: Orthopedics;  Laterality: Right;    Social History   Socioeconomic History  . Marital status: Single    Spouse name: Not on file  . Number of children: 1  .  Years of education: 56  . Highest education level: 12th grade  Occupational History  . Occupation: retired  Tobacco Use  . Smoking status: Never Smoker  . Smokeless tobacco: Never Used  Vaping Use  . Vaping Use: Never used  Substance and Sexual Activity  . Alcohol use: No  . Drug use: No  . Sexual activity: Not Currently  Other Topics Concern  . Not on file  Social History Narrative  . Not on file   Social Determinants of Health   Financial Resource Strain: Low Risk   . Difficulty of Paying Living Expenses: Not hard at all  Food Insecurity: No Food Insecurity  . Worried About Programme researcher, broadcasting/film/video in the Last Year: Never true  . Ran Out of Food in the Last Year: Never true  Transportation Needs: No Transportation Needs  . Lack of Transportation (Medical): No  . Lack of Transportation (Non-Medical): No  Physical Activity: Insufficiently Active  . Days of Exercise per Week: 2 days  . Minutes of Exercise per Session: 20 min  Stress: No Stress Concern Present  . Feeling of Stress : Not at all  Social Connections: Moderately Integrated  . Frequency of Communication with Friends and Family: More than three times a  week  . Frequency of Social Gatherings with Friends and Family: More than three times a week  . Attends Religious Services: More than 4 times per year  . Active Member of Clubs or Organizations: Yes  . Attends Archivist Meetings: More than 4 times per year  . Marital Status: Divorced  Human resources officer Violence: Not At Risk  . Fear of Current or Ex-Partner: No  . Emotionally Abused: No  . Physically Abused: No  . Sexually Abused: No   Family History  Problem Relation Age of Onset  . Osteoporosis Mother   . Alzheimer's disease Mother 47  . Arthritis Mother   . Cancer Father        Lung  . Arthritis Sister   . Obesity Sister   . Heart attack Brother 75  . Heart disease Son   . Arthritis Brother   . Arthritis Sister   . Cancer Sister        breast  cancer      VITAL SIGNS BP 98/66   Pulse 73   Temp (!) 97.5 F (36.4 C)   Resp 20   Ht 5\' 4"  (1.626 m)   Wt 168 lb (76.2 kg)   SpO2 93%   BMI 28.84 kg/m   Outpatient Encounter Medications as of 07/21/2020  Medication Sig  . acetaminophen (TYLENOL) 325 MG tablet Take 2 tablets (650 mg total) by mouth every 4 (four) hours as needed for mild pain (or temp > 37.5 C (99.5 F)).  Marland Kitchen alendronate (FOSAMAX) 70 MG tablet Take 70 mg by mouth once a week. Take with a full glass of water on an empty stomach first thing in the am, 6 am  . antiseptic oral rinse (BIOTENE) LIQD 30 mLs by Mouth Rinse route in the morning, at noon, and at bedtime.  Marland Kitchen apixaban (ELIQUIS) 5 MG TABS tablet Take 1 tablet (5 mg total) by mouth 2 (two) times daily.  Roseanne Kaufman Peru-Castor Oil (VENELEX) OINT Apply topically in the morning, at noon, and at bedtime. ointment; - ; amt: 1 application; topical  . diltiazem (CARDIZEM CD) 240 MG 24 hr capsule Take 1 capsule (240 mg total) by mouth daily.  . ergocalciferol (VITAMIN D2) 1.25 MG (50000 UT) capsule Take 50,000 Units by mouth once a week. On Friday  . ezetimibe (ZETIA) 10 MG tablet Take 1 tablet (10 mg total) by mouth daily.  . fluconazole (DIFLUCAN) 100 MG tablet Take 100 mg by mouth daily.  . folic acid (FOLVITE) 1 MG tablet Take 1 mg by mouth daily. 8 am  . furosemide (LASIX) 40 MG tablet Take 40 mg by mouth daily.  Marland Kitchen ipratropium (ATROVENT) 0.06 % nasal spray Place 1 spray into both nostrils 2 (two) times daily.  . magnesium oxide (MAG-OX) 400 (241.3 Mg) MG tablet Take 1 tablet (400 mg total) by mouth daily.  . melatonin 3 MG TABS tablet Take 1 tablet (3 mg total) by mouth at bedtime.  . metoprolol succinate (TOPROL-XL) 100 MG 24 hr tablet Take 100 mg by mouth daily. Take with or immediately following a meal.  . NON FORMULARY Accu-check daily. Notify provide of results under 60 or over 400. Once A Day 06:00 AM  . potassium chloride (KLOR-CON) 10 MEQ tablet Take 10 mEq  by mouth daily.  . QUEtiapine (SEROQUEL) 25 MG tablet Take 25 mg by mouth at bedtime. 8 pm  . saccharomyces boulardii (FLORASTOR) 250 MG capsule Take 1 capsule (250 mg total) by mouth 2 (two)  times daily.  Marland Kitchen senna-docusate (SENOKOT-S) 8.6-50 MG tablet Take 2 tablets by mouth 2 (two) times daily.  . sertraline (ZOLOFT) 50 MG tablet Take 50 mg by mouth daily.  . tamsulosin (FLOMAX) 0.4 MG CAPS capsule Take 1 capsule (0.4 mg total) by mouth daily.  Marland Kitchen UNABLE TO FIND DIET:Regular  . Zinc Gluconate-Vitamin C (ZINC-VITAMIN C LOZENGE MT) Use as directed 1 lozenge in the mouth or throat 5 (five) times daily.  . [DISCONTINUED] metoprolol succinate (TOPROL-XL) 100 MG 24 hr tablet Take 1 tablet (100 mg total) by mouth daily. Take with or immediately following a meal.   No facility-administered encounter medications on file as of 07/21/2020.     SIGNIFICANT DIAGNOSTIC EXAMS   PREVIOUS  05-14-20: mri/mra of head:  1. Cortical restricted diffusion within the left temporal occipital region. Scattered foci of restricted diffusion are also seen in the left frontoparietal and insular regions and left amygdala. Findings are consistent with acute infarcts. No hemorrhagic transformation, significant mass effect or midline shift. 2. No proximal occlusion or stenosis in the anterior or posterior circulation.   05-14-20: renal ultrasound:  Foley catheter in place. Urinary bladder decompressed. No hydronephrosis. Small bilateral pleural effusions. No acute findings in the abdomen or pelvis.  06-15-20: ct of abdomen and pelvis:  Foley catheter in place. Urinary bladder decompressed. No hydronephrosis. Small bilateral pleural effusions. No acute findings in the abdomen or pelvis.  06-26-20: chest x-ray:  1. Slight decrease in bilateral pleural effusions, now small. Mild overlying bibasilar opacities, favor atelectasis. 2. Cardiomegaly and pulmonary vascular congestion.  06-26-20: ct of head:  1. No acute  intracranial abnormality. 2. Chronic small vessel ischemic changes and cerebral atrophy.  NO NEW EXAMS.     LABS REVIEWED PREVIOUS    05-04-20: chol 173; ldl 124; trig 65; hdl 36; hgb a1c 6.4 06-26-20: wbc 11.8; hgb 10.4; hct 34.5; mcv 77.2 plt 354; glucose 135; bun 16; creat 0.61; k+ 4.3; na++ 125; na 9.0 GFR >60; liver normal albumin 3.2 tsh 6.501 06-27-20: mag 1.9 06-28-20: wbc 8.1; hgb 9.0; hct 30.6; mcv 77.7 plt 294 glucose 75; bun 11; creat 0.51; k+ 3.9; na++ 131; ca 8.8 GFR>60 bit B 12: 5070; folate 5.0  07-03-20: glucose 125; bun 19; creat 0.78; k+ 4.3; na++ 121; ca 8.6 GFR >60 07-04-20: glucose 110; bun 20; creat 0.84; k+ 4.0; na++ 122; ca 8.9 GFR >60 07-05-20: glucose 100; bun 16; creat 0.73; k+ 4.0; na++ 125; ca 8.5; GFR >60 07-06-20: wbc 11.9 hgb 9.3; hct 31.0; mcv 75.6 plt 341; glucose 85; bun 18; creat 0.71; k+ 3.9; na++ 128; ca 8.8 liver normal albumin 3.1; GFR >60   TODAY  07-21-20: + covid    Review of Systems  Constitutional: Negative for malaise/fatigue.  Respiratory: Negative for cough and shortness of breath.   Cardiovascular: Negative for chest pain, palpitations and leg swelling.  Gastrointestinal: Negative for abdominal pain, constipation and heartburn.  Musculoskeletal: Negative for back pain, joint pain and myalgias.  Skin: Negative.   Neurological: Negative for dizziness.  Psychiatric/Behavioral: The patient is not nervous/anxious.     Physical Exam Constitutional:      General: She is not in acute distress.    Appearance: She is well-developed and well-nourished. She is not diaphoretic.  Neck:     Thyroid: No thyromegaly.  Cardiovascular:     Rate and Rhythm: Normal rate and regular rhythm.     Pulses: Intact distal pulses.     Heart sounds: Normal heart sounds.  Pulmonary:     Effort: Pulmonary effort is normal. No respiratory distress.     Breath sounds: Normal breath sounds.  Abdominal:     General: Bowel sounds are normal. There is no distension.      Palpations: Abdomen is soft.     Tenderness: There is no abdominal tenderness.  Musculoskeletal:     Right lower leg: Edema present.     Left lower leg: Edema present.     Comments: Right side weakness  Is able to move all extremities   2+ bilateral lower extremity edema      Lymphadenopathy:     Cervical: No cervical adenopathy.  Skin:    General: Skin is warm and dry.  Neurological:     Mental Status: She is alert. Mental status is at baseline.     Comments: 06-29-20: SLUMS: 9/30     Psychiatric:        Mood and Affect: Mood and affect and mood normal.       ASSESSMENT/ PLAN:  TODAY;   1. covid 19 with diabetes mellitus: is without symptoms as this time will begin 2 weeks of vitd zinc vitc. Will make further changes if she becomes symptomatic.     MD is aware of resident's narcotic use and is in agreement with current plan of care. We will attempt to wean resident as appropriate.  Ok Edwards NP [facility]  Contact [phone] Monday through Friday 8am- 5pm  After hours call [phone]

## 2020-07-28 DIAGNOSIS — E119 Type 2 diabetes mellitus without complications: Secondary | ICD-10-CM | POA: Insufficient documentation

## 2020-07-28 DIAGNOSIS — U071 COVID-19: Secondary | ICD-10-CM | POA: Insufficient documentation

## 2020-08-01 ENCOUNTER — Encounter: Payer: Self-pay | Admitting: Adult Health

## 2020-08-01 DIAGNOSIS — M25551 Pain in right hip: Secondary | ICD-10-CM | POA: Diagnosis not present

## 2020-08-01 DIAGNOSIS — W19XXXA Unspecified fall, initial encounter: Secondary | ICD-10-CM | POA: Diagnosis not present

## 2020-08-01 DIAGNOSIS — M79651 Pain in right thigh: Secondary | ICD-10-CM | POA: Diagnosis not present

## 2020-08-01 NOTE — Progress Notes (Signed)
Location:  [facility] Room Number: 161 Place of Service:  SNF (31)   CODE STATUS: full  Allergies  Allergen Reactions  . Crestor [Rosuvastatin] Other (See Comments)    Cramps  . Lipitor [Atorvastatin] Other (See Comments)    Cramps  . Pravastatin Other (See Comments)    Cramps   . Keflex [Cephalexin] Hives    Chief Complaint  Patient presents with  . Medical Management of Chronic Issues         Hyperlipidemia associated with type 2 diabetes mellitus: Hypertension associated with diabetes  Chronic diastolic congestive heart failure:    HPI:  She is 80 year old long term resident of this facility being seen for the management of her chronic illness: Hyperlipidemia associated with type 2 diabetes mellitus: Hypertension associated with diabetes Chronic diastolic congestive heart failure: She continues to get out of bed daily; no changes in appetite; no reports of anxiety present. Her foley is out.    Past Medical History:  Diagnosis Date  . Arthritis   . Atrial fibrillation ([place])   . Complication of anesthesia   . Diabetes mellitus without complication ([place])   . Diverticulosis   . Fatigue   . Gastric polyp   . Goiter   . Hyperlipidemia   . Hypertension   . Hypothyroid    taken off of thyroid medication 2012014   . Obese   . Osteopenia   . PONV (postoperative nausea and vomiting)   . Postmenopausal   . Rosacea   . Stroke ([place]) 01/15/2013   left thalamic  stroke, small vessel  . Vitamin D deficiency     Past Surgical History:  Procedure Laterality Date  . ABDOMINAL HYSTERECTOMY  1985  . FOOT SURGERY Right 08/2002  . IR CT HEAD LTD  05/03/2020  . IR PERCUTANEOUS ART THROMBECTOMY/INFUSION INTRACRANIAL INC DIAG ANGIO  05/03/2020  . RADIOLOGY WITH ANESTHESIA N/A 05/03/2020   Procedure: IR WITH ANESTHESIA;  Surgeon: Radiologist, Medication, MD;  Location: [facility];  Service: Radiology;  Laterality: N/A;  . right knee arthroscopy   2013   .  TONSILLECTOMY    . 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   Procedure: RIGHT TOTAL KNEE ARTHROPLASTY;  Surgeon: Gearlean Alf, MD;  Location: [facility];  Service: Orthopedics;  Laterality: Right;    Social History   Socioeconomic History  . Marital status: Single    Spouse name: Not on file  . Number of children: 1  . Years of education: 29  . Highest education level: 12th grade  Occupational History  . Occupation: retired  Tobacco Use  . Smoking status: Never Smoker  . Smokeless tobacco: Never Used  Vaping Use  . Vaping Use: Never used  Substance and Sexual Activity  . Alcohol use: No  . Drug use: No  . Sexual activity: Not Currently  Other Topics Concern  . Not on file  Social History Narrative  . Not on file   Social Determinants of Health   Financial Resource Strain: Low Risk   . Difficulty of Paying Living Expenses: Not hard at all  Food Insecurity: No Food Insecurity  . Worried About Charity fundraiser in the Last Year: Never true  . Ran Out of Food in the Last Year: Never true  Transportation Needs: No Transportation Needs  . Lack of Transportation (Medical): No  . Lack of Transportation (Non-Medical): No  Physical Activity: Insufficiently Active  . Days of Exercise per Week: 2 days  .  Minutes of Exercise per Session: 20 min  Stress: No Stress Concern Present  . Feeling of Stress : Not at all  Social Connections: Moderately Integrated  . Frequency of Communication with Friends and Family: More than three times a week  . Frequency of Social Gatherings with Friends and Family: More than three times a week  . Attends Religious Services: More than 4 times per year  . Active Member of Clubs or Organizations: Yes  . Attends Archivist Meetings: More than 4 times per year  . Marital Status: Divorced  Human resources officer Violence: Not At Risk  . Fear of Current or Ex-Partner: No  . Emotionally Abused: No  . Physically Abused: No  . Sexually Abused: No    Family History  Problem Relation Age of Onset  . Osteoporosis Mother   . Alzheimer's disease Mother 58  . Arthritis Mother   . Cancer Father        Lung  . Arthritis Sister   . Obesity Sister   . Heart attack Brother 19  . Heart disease Son   . Arthritis Brother   . Arthritis Sister   . Cancer Sister        breast cancer      VITAL SIGNS BP 100/60   Pulse 70   Temp 98.1 F (36.7 C)   Ht 5\' 4"  (1.626 m)   Wt 168 lb (76.2 kg)   BMI 28.84 kg/m   Outpatient Encounter Medications as of 07/21/2020  Medication Sig  . acetaminophen (TYLENOL) 325 MG tablet Take 2 tablets (650 mg total) by mouth every 4 (four) hours as needed for mild pain (or temp > 37.5 C (99.5 F)).  Marland Kitchen alendronate (FOSAMAX) 70 MG tablet Take 70 mg by mouth once a week. Take with a full glass of water on an empty stomach first thing in the am, 6 am  . antiseptic oral rinse (BIOTENE) LIQD 30 mLs by Mouth Rinse route in the morning, at noon, and at bedtime.  Marland Kitchen apixaban (ELIQUIS) 5 MG TABS tablet Take 1 tablet (5 mg total) by mouth 2 (two) times daily.  Roseanne Kaufman [place]-Castor Oil (VENELEX) OINT Apply topically in the morning, at noon, and at bedtime. ointment; - ; amt: 1 application; topical  . diltiazem (CARDIZEM CD) 240 MG 24 hr capsule Take 1 capsule (240 mg total) by mouth daily.  . ergocalciferol (VITAMIN D2) 1.25 MG (50000 UT) capsule Take 50,000 Units by mouth once a week. On Friday  . ezetimibe (ZETIA) 10 MG tablet Take 1 tablet (10 mg total) by mouth daily.  . [EXPIRED] fluconazole (DIFLUCAN) 100 MG tablet Take 100 mg by mouth daily.  . folic acid (FOLVITE) 1 MG tablet Take 1 mg by mouth daily. 8 am  . furosemide (LASIX) 40 MG tablet Take 40 mg by mouth daily.  Marland Kitchen ipratropium (ATROVENT) 0.06 % nasal spray Place 1 spray into both nostrils 2 (two) times daily.  . magnesium oxide (MAG-OX) 400 (241.3 Mg) MG tablet Take 1 tablet (400 mg total) by mouth daily.  . melatonin 3 MG TABS tablet Take 1 tablet (3 mg  total) by mouth at bedtime.  . metoprolol succinate (TOPROL-XL) 100 MG 24 hr tablet Take 100 mg by mouth daily. Take with or immediately following a meal.  . NON FORMULARY Accu-check daily. Notify provide of results under 60 or over 400. Once A Day 06:00 AM  . potassium chloride (KLOR-CON) 10 MEQ tablet Take 10 mEq by mouth daily.  Marland Kitchen  QUEtiapine (SEROQUEL) 25 MG tablet Take 25 mg by mouth at bedtime. 8 pm  . saccharomyces boulardii (FLORASTOR) 250 MG capsule Take 1 capsule (250 mg total) by mouth 2 (two) times daily.  Marland Kitchen senna-docusate (SENOKOT-S) 8.6-50 MG tablet Take 2 tablets by mouth 2 (two) times daily.  . sertraline (ZOLOFT) 50 MG tablet Take 50 mg by mouth daily.  . tamsulosin (FLOMAX) 0.4 MG CAPS capsule Take 1 capsule (0.4 mg total) by mouth daily.  Marland Kitchen UNABLE TO FIND DIET:Regular  . [EXPIRED] Zinc Gluconate-Vitamin C (ZINC-VITAMIN C LOZENGE MT) Use as directed 1 lozenge in the mouth or throat 5 (five) times daily.   No facility-administered encounter medications on file as of 07/21/2020.     SIGNIFICANT DIAGNOSTIC EXAMS    PREVIOUS  05-14-20: mri/mra of head:  1. Cortical restricted diffusion within the left temporal occipital region. Scattered foci of restricted diffusion are also seen in the left frontoparietal and insular regions and left amygdala. Findings are consistent with acute infarcts. No hemorrhagic transformation, significant mass effect or midline shift. 2. No proximal occlusion or stenosis in the anterior or posterior circulation.   05-14-20: renal ultrasound:  Foley catheter in place. Urinary bladder decompressed. No hydronephrosis. Small bilateral pleural effusions. No acute findings in the abdomen or pelvis.  06-15-20: ct of abdomen and pelvis:  Foley catheter in place. Urinary bladder decompressed. No hydronephrosis. Small bilateral pleural effusions. No acute findings in the abdomen or pelvis.  06-26-20: chest x-ray:  1. Slight decrease in bilateral  pleural effusions, now small. Mild overlying bibasilar opacities, favor atelectasis. 2. Cardiomegaly and pulmonary vascular congestion.  06-26-20: ct of head:  1. No acute intracranial abnormality. 2. Chronic small vessel ischemic changes and cerebral atrophy.  NO NEW EXAMS.     LABS REVIEWED PREVIOUS    05-04-20: chol 173; ldl 124; trig 65; hdl 36; hgb a1c 6.4 06-26-20: wbc 11.8; hgb 10.4; hct 34.5; mcv 77.2 plt 354; glucose 135; bun 16; creat 0.61; k+ 4.3; na++ 125; na 9.0 GFR >60; liver normal albumin 3.2 tsh 6.501 06-27-20: mag 1.9 06-28-20: wbc 8.1; hgb 9.0; hct 30.6; mcv 77.7 plt 294 glucose 75; bun 11; creat 0.51; k+ 3.9; na++ 131; ca 8.8 GFR>60 bit B 12: 5070; folate 5.0  07-03-20: glucose 125; bun 19; creat 0.78; k+ 4.3; na++ 121; ca 8.6 GFR >60 07-04-20: glucose 110; bun 20; creat 0.84; k+ 4.0; na++ 122; ca 8.9 GFR >60 07-05-20: glucose 100; bun 16; creat 0.73; k+ 4.0; na++ 125; ca 8.5; GFR >60 07-06-20: wbc 11.9 hgb 9.3; hct 31.0; mcv 75.6 plt 341; glucose 85; bun 18; creat 0.71; k+ 3.9; na++ 128; ca 8.8 liver normal albumin 3.1; GFR >60 free t4: 1.49 free t3: 2.1  TODAY  07-18-20: glucose 124; bun 23; creat 0.78; k+ 3.8; na++ 133; ca 8.8 GFR >60 07-21-20: d-dimer: 0.99; CRP 0.7    Review of Systems  Constitutional: Negative for malaise/fatigue.  Respiratory: Negative for cough and shortness of breath.   Cardiovascular: Negative for chest pain, palpitations and leg swelling.  Gastrointestinal: Negative for abdominal pain, constipation and heartburn.  Musculoskeletal: Negative for back pain, joint pain and myalgias.  Skin: Negative.   Neurological: Negative for dizziness.  Psychiatric/Behavioral: The patient is not nervous/anxious.     Physical Exam Constitutional:      General: She is not in acute distress.    Appearance: She is well-developed and well-nourished. She is not diaphoretic.  Neck:     Thyroid: No thyromegaly.  Cardiovascular:  Rate and Rhythm: Normal rate  and regular rhythm.     Pulses: Normal pulses and intact distal pulses.     Heart sounds: Normal heart sounds.  Pulmonary:     Effort: Pulmonary effort is normal. No respiratory distress.     Breath sounds: Normal breath sounds.  Abdominal:     General: Bowel sounds are normal. There is no distension.     Palpations: Abdomen is soft.     Tenderness: There is no abdominal tenderness.  Musculoskeletal:     Cervical back: Neck supple.     Right lower leg: Edema present.     Left lower leg: Edema present.     Comments: Right side weakness  Is able to move all extremities   2+ bilateral lower extremity edema     Lymphadenopathy:     Cervical: No cervical adenopathy.  Skin:    General: Skin is warm and dry.  Neurological:     Mental Status: She is alert. Mental status is at baseline.     Comments: 06-29-20: SLUMS: 9/30  Psychiatric:        Mood and Affect: Mood and affect and mood normal.     ASSESSMENT/ PLAN:  TODAY  1. Hyperlipidemia associated with type 2 diabetes mellitus: is stable LDL 124; will continue zetia 10 mg daily unable to tolerate statins  2. Hypertension associated with diabetes is stable will continue 100/60 will continue toprol xl 100 mg daily diltiazem cd 240 mg daily   3. Chronic diastolic congestive heart failure: EF 55-60% (05-04-20): she continues to have increased edema: will continue toprol xl 100 mg daily will increase to lasix 40 mg twice daily with k+ 20 meq daily and will check BMP 08-10-20    PREVIOUS   4. 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   5. Age related osteoporosis without pathologic fracture: is stable will continue fosamax 70 mh weekly   6. Urinary retention/neurogenic bladder: has long term foley will continue flomax 0.4 mg daily will follow up with urology  7. Hypomagnesemia: is  stable mag 1.9 will continue mag ox daily   8. Aortic atherosclerosis (CT 06-15-20) will monitor   9. Aphasia as late effect of cerebrovascular accident: is without change: being seen by ST  10. Vascular dementia with behavioral disturbance: is without change; weight is 167 pounds will monitor her status.   11. Rhinorrhea: is stable will continue atrovent nasal spray twice daily   12. Chronic anemia: is stable hgb 9.0 will monitor  13. Folate deficiency: is without change level is 5.0 continue  folic acid 1 mg daily   14. 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 Hyponatremia: is without change na++ 133 will monitor   16. 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    17.  Hypothyroidism unspecified: is stable tsh 6.501 free t3: 1.49; free t4: 2.1 will monitor   18. Permanent atrial fibrillation: heart rate is stable will continue toprol xl 100 mg daily and diltiazem cd 140 mg daily for rate control and eliquis 5 mg daily   19. Slow transit constipation: is stable will continue senna s 2 tabs twice daily and florastor twice daily  20. Anxiety: is stable will continue  zoloft 50 mg daily and will monitor her status.        MD is aware of resident's narcotic use and is in agreement with current plan of care. We will attempt to wean resident as appropriate.  Ok Edwards NP [facility]  Contact [phone] Monday through Friday 8am- 5pm  After hours call [phone]

## 2020-08-02 ENCOUNTER — Ambulatory Visit (HOSPITAL_COMMUNITY)
Admission: RE | Admit: 2020-08-02 | Discharge: 2020-08-02 | Disposition: A | Discharge status: Home / Self Care | Payer: Medicare Other | Source: Ambulatory Visit | Attending: Adult Health | Admitting: Adult Health

## 2020-08-02 ENCOUNTER — Non-Acute Institutional Stay (SKILLED_NURSING_FACILITY): Payer: Medicare Other | Admitting: Adult Health

## 2020-08-02 ENCOUNTER — Encounter: Payer: Self-pay | Admitting: Adult Health

## 2020-08-02 DIAGNOSIS — M25451 Effusion, right hip: Secondary | ICD-10-CM | POA: Diagnosis not present

## 2020-08-02 DIAGNOSIS — R52 Pain, unspecified: Secondary | ICD-10-CM | POA: Insufficient documentation

## 2020-08-02 DIAGNOSIS — I5032 Chronic diastolic (congestive) heart failure: Secondary | ICD-10-CM | POA: Diagnosis not present

## 2020-08-02 DIAGNOSIS — M1611 Unilateral primary osteoarthritis, right hip: Secondary | ICD-10-CM | POA: Diagnosis not present

## 2020-08-02 DIAGNOSIS — M6258 Muscle wasting and atrophy, not elsewhere classified, other site: Secondary | ICD-10-CM | POA: Diagnosis not present

## 2020-08-02 IMAGING — CT CT HIP*R* W/O CM
2 of 4 series · 16 of 46 positions shown, 18 images · non-contrast
Comparison: CT [DATE]

CLINICAL DATA: Right groin pain after fall [DATE]

EXAM:
CT OF THE RIGHT HIP WITHOUT CONTRAST
TECHNIQUE: Multidetector CT imaging of the right hip was performed according to
the standard protocol. Multiplanar CT image reconstructions were
also generated.

[Series 5: 3 axial soft · axial · 0.48mm/px · z∈[+592,+774]mm · 13 of 105 slices shown, 15 images]
[im 7/105  soft-tissue]
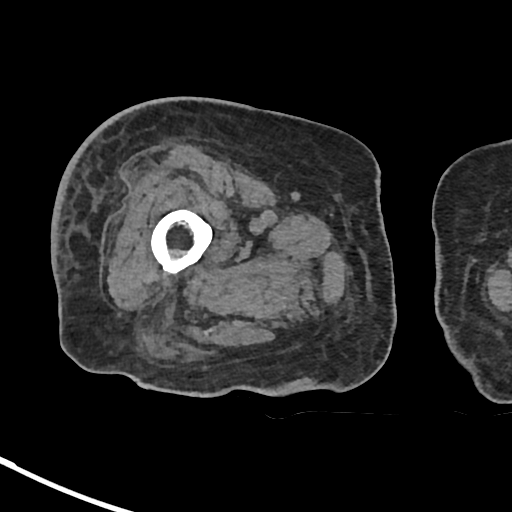
[im 7/105  bone]
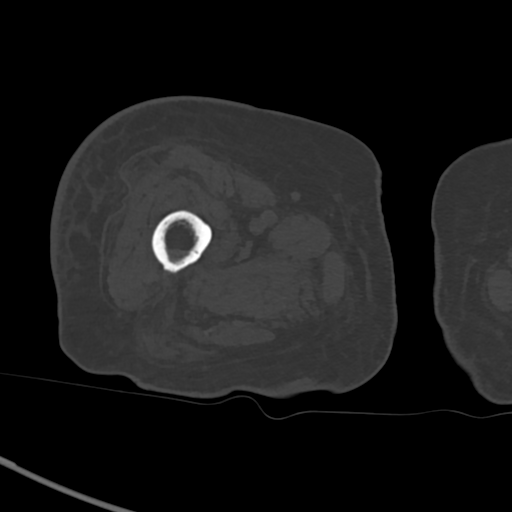
[im 14/105  soft-tissue]
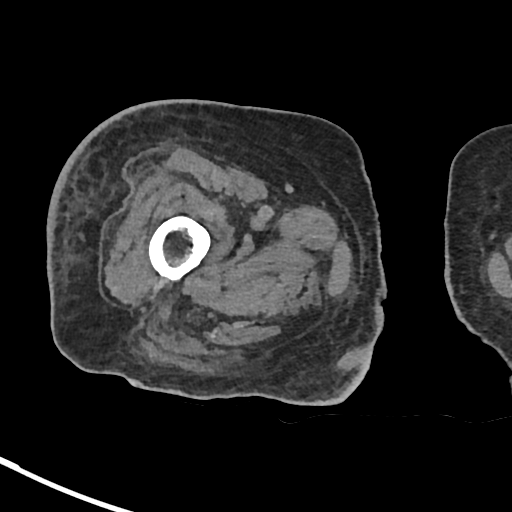
[im 21/105  soft-tissue]
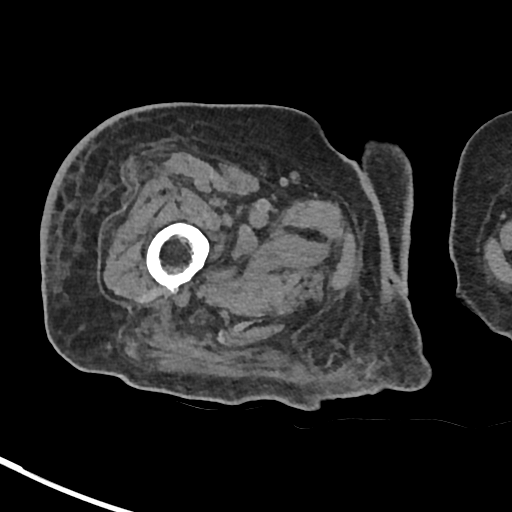
[im 31/105  soft-tissue]
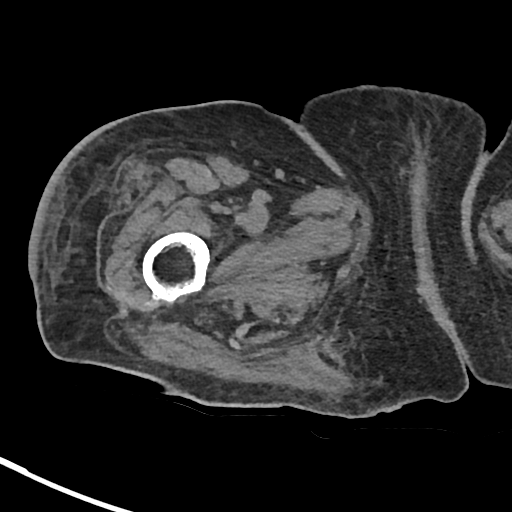
[im 37/105  soft-tissue]
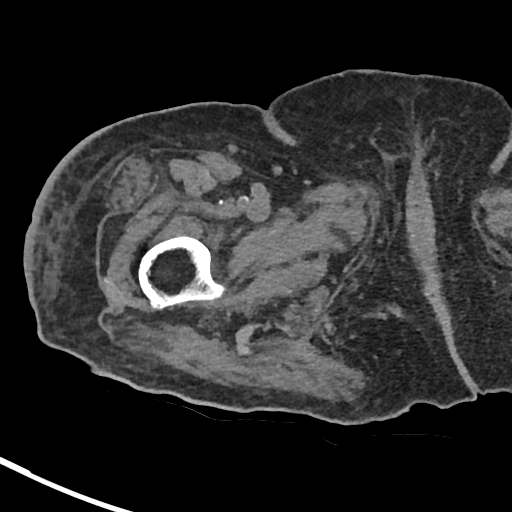
[im 44/105  soft-tissue]
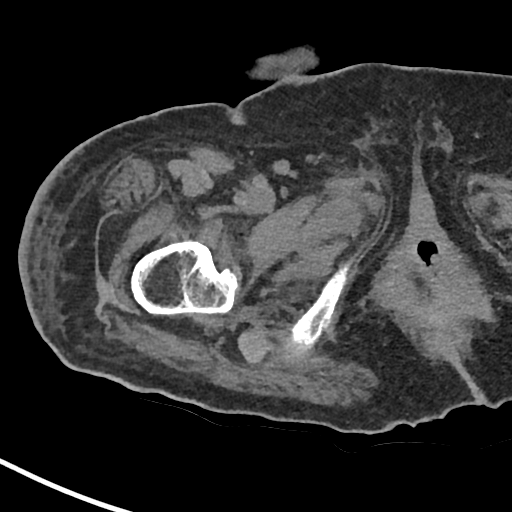
[im 54/105  soft-tissue]
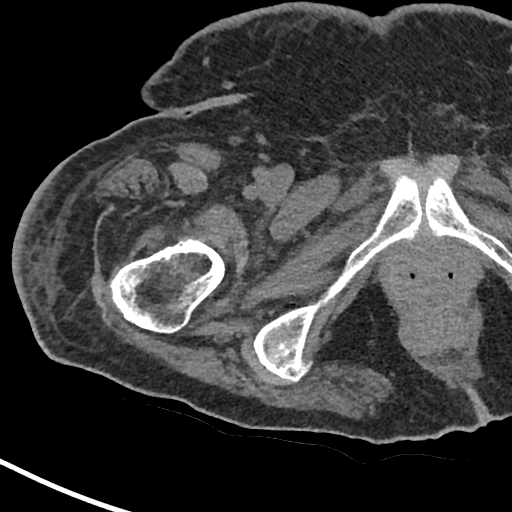
[im 61/105  soft-tissue]
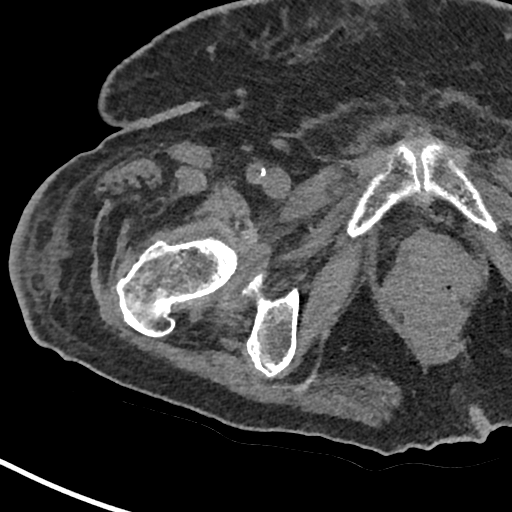
[im 68/105  soft-tissue]
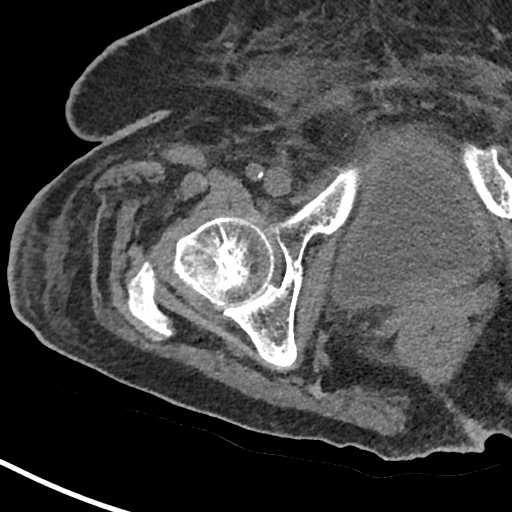
[im 68/105  bone]
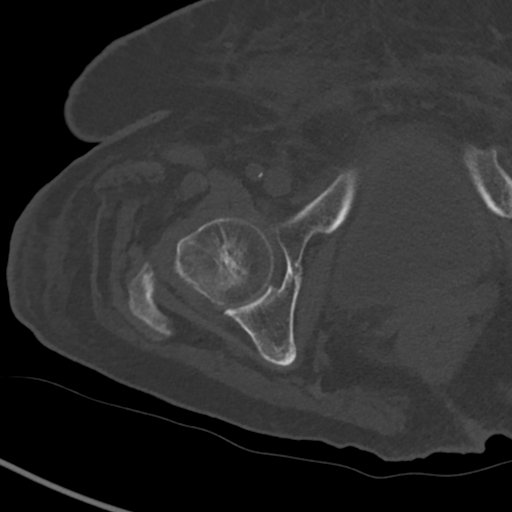
[im 74/105  soft-tissue]
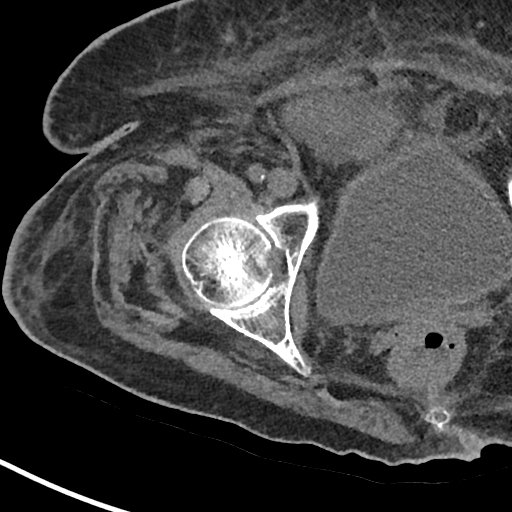
[im 84/105  soft-tissue]
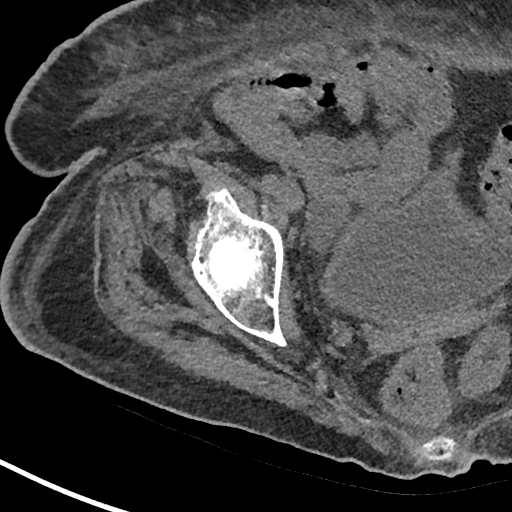
[im 91/105  soft-tissue]
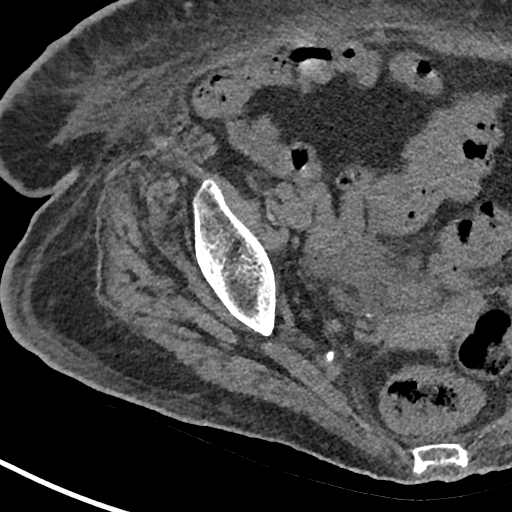
[im 98/105  soft-tissue]
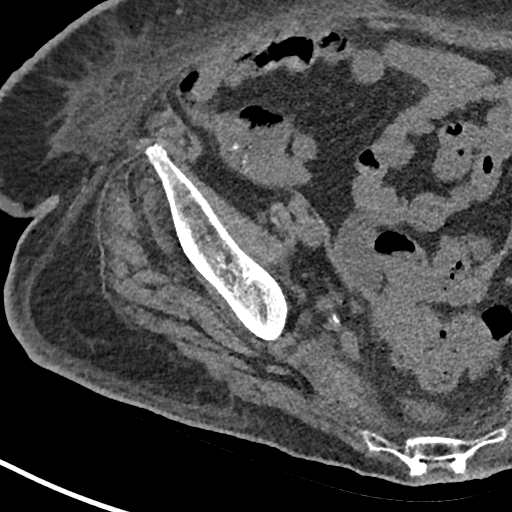

[Series 8: coronal st · coronal · 0.40mm/px · 3 of 103 slices shown]
[im 26/103  soft-tissue]
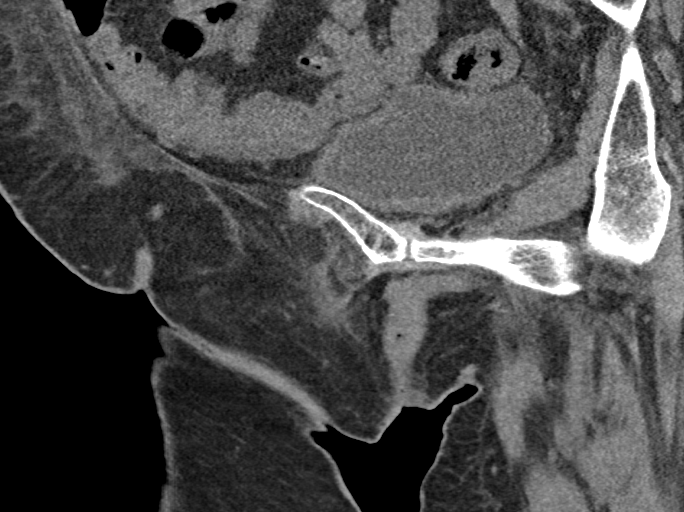
[im 52/103  soft-tissue]
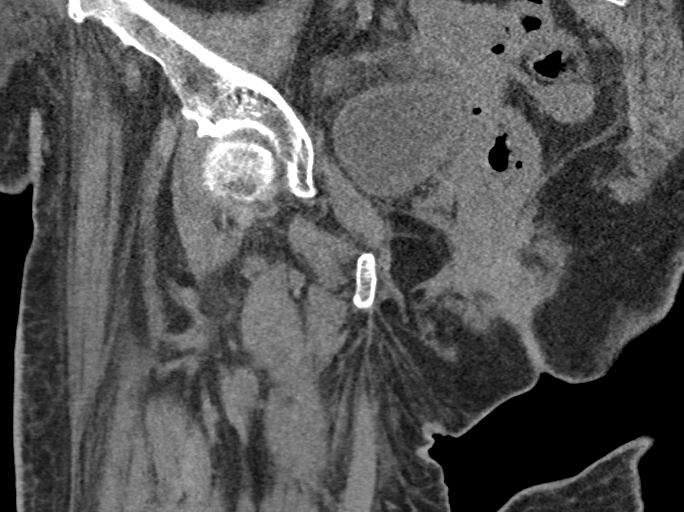
[im 77/103  soft-tissue]
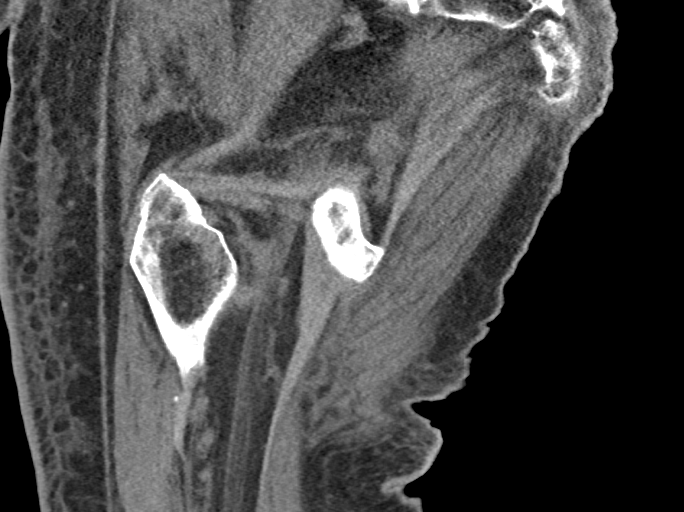

[16 of 46 positions shown; findings below may reference images not displayed]

FINDINGS: Bones/Joint/Cartilage

No acute fracture. No dislocation. Mild osteoarthritis of the right
hip with small subchondral cystic changes in posterior acetabulum.
Small hip joint effusion, nonspecific. The visualized portion of the
right hemipelvis is intact without fracture or diastasis. No
suspicious lytic or sclerotic bone lesion.

Ligaments

Suboptimally assessed by CT.

Muscles and Tendons

Mild diffuse muscle atrophy.  No acute tendinous abnormality by CT.

Soft tissues

Mild anasarca. No fluid collections within the soft tissues. No
inguinal lymphadenopathy. Scattered atherosclerosis.
IMPRESSION: 1. No acute fracture or dislocation of the right hip.
2. Mild osteoarthritis of the right hip with small hip joint
effusion, nonspecific.
3. Mild anasarca.

## 2020-08-02 NOTE — Progress Notes (Signed)
This encounter was created in error - please disregard.

## 2020-08-02 NOTE — Addendum Note (Signed)
Addended by: Gerlene Fee on: 08/02/2020 12:11 PM   Modules accepted: Level of Service, SmartSet

## 2020-08-02 NOTE — Progress Notes (Unsigned)
Location:  [facility] Room Number: 129/P Place of Service:  SNF (31)   CODE STATUS: Full Code  Allergies  Allergen Reactions  . Crestor [Rosuvastatin] Other (See Comments)    Cramps  . Lipitor [Atorvastatin] Other (See Comments)    Cramps  . Pravastatin Other (See Comments)    Cramps   . Keflex [Cephalexin] Hives    Chief Complaint  Patient presents with  . Acute Visit    Edema     HPI:  The staff are concerned that her lower extremity is getting worse over the past several days. She denies any lower extremity pain; no warmth or tenderness present she is not wearing ted hose at this time. She is presently taking lasix 40 mg daily.   Past Medical History:  Diagnosis Date  . Arthritis   . Atrial fibrillation ([place])   . Complication of anesthesia   . Diabetes mellitus without complication ([place])   . Diverticulosis   . Fatigue   . Gastric polyp   . Goiter   . Hyperlipidemia   . Hypertension   . Hypothyroid    taken off of thyroid medication 2012014   . Obese   . Osteopenia   . PONV (postoperative nausea and vomiting)   . Postmenopausal   . Rosacea   . 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 Vitamin D deficiency     Past Surgical History:  Procedure Laterality Date  . ABDOMINAL HYSTERECTOMY  1985  . FOOT SURGERY Right 08/2002  . IR CT HEAD LTD  05/03/2020  . IR PERCUTANEOUS ART THROMBECTOMY/INFUSION INTRACRANIAL INC DIAG ANGIO  05/03/2020  . RADIOLOGY WITH ANESTHESIA N/A 05/03/2020   Procedure: IR WITH ANESTHESIA;  Surgeon: Radiologist, Medication, MD;  Location: [facility];  Service: Radiology;  Laterality: N/A;  . right knee arthroscopy   2013   . TONSILLECTOMY    . 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   Procedure: RIGHT TOTAL KNEE ARTHROPLASTY;  Surgeon: Gearlean Alf, MD;  Location: [facility];  Service: Orthopedics;  Laterality: Right;    Social History   Socioeconomic History  . Marital status: Single     Spouse name: Not on file  . Number of children: 1  . Years of education: 34  . Highest education level: 12th grade  Occupational History  . Occupation: retired  Tobacco Use  . Smoking status: Never Smoker  . Smokeless tobacco: Never Used  Vaping Use  . Vaping Use: Never used  Substance and Sexual Activity  . Alcohol use: No  . Drug use: No  . Sexual activity: Not Currently  Other Topics Concern  . Not on file  Social History Narrative  . Not on file   Social Determinants of Health   Financial Resource Strain: Low Risk   . Difficulty of Paying Living Expenses: Not hard at all  Food Insecurity: No Food Insecurity  . Worried About Charity fundraiser in the Last Year: Never true  . Ran Out of Food in the Last Year: Never true  Transportation Needs: No Transportation Needs  . Lack of Transportation (Medical): No  . Lack of Transportation (Non-Medical): No  Physical Activity: Insufficiently Active  . Days of Exercise per Week: 2 days  . Minutes of Exercise per Session: 20 min  Stress: No Stress Concern Present  . Feeling of Stress : Not at all  Social Connections: Moderately Integrated  . Frequency of Communication with Friends and Family:  More than three times a week  . Frequency of Social Gatherings with Friends and Family: More than three times a week  . Attends Religious Services: More than 4 times per year  . Active Member of Clubs or Organizations: Yes  . Attends Archivist Meetings: More than 4 times per year  . Marital Status: Divorced  Human resources officer Violence: Not At Risk  . Fear of Current or Ex-Partner: No  . Emotionally Abused: No  . Physically Abused: No  . Sexually Abused: No   Family History  Problem Relation Age of Onset  . Osteoporosis Mother   . Alzheimer's disease Mother 74  . Arthritis Mother   . Cancer Father        Lung  . Arthritis Sister   . Obesity Sister   . Heart attack Brother 56  . Heart disease Son   . Arthritis Brother    . Arthritis Sister   . Cancer Sister        breast cancer      VITAL SIGNS BP 96/63   Pulse 72   Temp 98.2 F (36.8 C)   Resp 20   Ht 5\' 4"  (1.626 m)   Wt 167 lb 1.6 oz (75.8 kg)   SpO2 93%   BMI 28.68 kg/m   Outpatient Encounter Medications as of 08/02/2020  Medication Sig  . acetaminophen (TYLENOL) 325 MG tablet Take 2 tablets (650 mg total) by mouth every 4 (four) hours as needed for mild pain (or temp > 37.5 C (99.5 F)).  Marland Kitchen alendronate (FOSAMAX) 70 MG tablet Take 70 mg by mouth once a week. Take with a full glass of water on an empty stomach first thing in the am, 6 am  . antiseptic oral rinse (BIOTENE) LIQD 30 mLs by Mouth Rinse route in the morning, at noon, and at bedtime.  Marland Kitchen apixaban (ELIQUIS) 5 MG TABS tablet Take 1 tablet (5 mg total) by mouth 2 (two) times daily.  Roseanne Kaufman Peru-Castor Oil (VENELEX) OINT Apply topically in the morning, at noon, and at bedtime. ointment; - ; amt: 1 application; topical  . diltiazem (CARDIZEM CD) 240 MG 24 hr capsule Take 1 capsule (240 mg total) by mouth daily.  Marland Kitchen ezetimibe (ZETIA) 10 MG tablet Take 1 tablet (10 mg total) by mouth daily.  . folic acid (FOLVITE) 1 MG tablet Take 1 mg by mouth daily. 8 am  . furosemide (LASIX) 40 MG tablet Take 40 mg by mouth daily.  Marland Kitchen ipratropium (ATROVENT) 0.06 % nasal spray Place 1 spray into both nostrils 2 (two) times daily.  . magnesium oxide (MAG-OX) 400 (241.3 Mg) MG tablet Take 1 tablet (400 mg total) by mouth daily.  . melatonin 3 MG TABS tablet Take 1 tablet (3 mg total) by mouth at bedtime.  . metoprolol succinate (TOPROL-XL) 100 MG 24 hr tablet Take 100 mg by mouth daily. Take with or immediately following a meal.  . NON FORMULARY Accu-check daily. Notify provide of results under 60 or over 400. Once A Day 06:00 AM  . potassium chloride (KLOR-CON) 10 MEQ tablet Take 20 mEq by mouth daily.  . QUEtiapine (SEROQUEL) 25 MG tablet Take 25 mg by mouth at bedtime. 8 pm  . saccharomyces boulardii  (FLORASTOR) 250 MG capsule Take 1 capsule (250 mg total) by mouth 2 (two) times daily.  Marland Kitchen senna-docusate (SENOKOT-S) 8.6-50 MG tablet Take 2 tablets by mouth 2 (two) times daily.  . sertraline (ZOLOFT) 50 MG tablet Take 50  mg by mouth daily.  . tamsulosin (FLOMAX) 0.4 MG CAPS capsule Take 1 capsule (0.4 mg total) by mouth daily.  Marland Kitchen UNABLE TO FIND DIET:Regular  . [DISCONTINUED] ergocalciferol (VITAMIN D2) 1.25 MG (50000 UT) capsule Take 50,000 Units by mouth once a week. On Friday   No facility-administered encounter medications on file as of 08/02/2020.     SIGNIFICANT DIAGNOSTIC EXAMS  PREVIOUS  05-14-20: mri/mra of head:  1. Cortical restricted diffusion within the left temporal occipital region. Scattered foci of restricted diffusion are also seen in the left frontoparietal and insular regions and left amygdala. Findings are consistent with acute infarcts. No hemorrhagic transformation, significant mass effect or midline shift. 2. No proximal occlusion or stenosis in the anterior or posterior circulation.   05-14-20: renal ultrasound:  Foley catheter in place. Urinary bladder decompressed. No hydronephrosis. Small bilateral pleural effusions. No acute findings in the abdomen or pelvis.  06-15-20: ct of abdomen and pelvis:  Foley catheter in place. Urinary bladder decompressed. No hydronephrosis. Small bilateral pleural effusions. No acute findings in the abdomen or pelvis.  06-26-20: chest x-ray:  1. Slight decrease in bilateral pleural effusions, now small. Mild overlying bibasilar opacities, favor atelectasis. 2. Cardiomegaly and pulmonary vascular congestion.  06-26-20: ct of head:  1. No acute intracranial abnormality. 2. Chronic small vessel ischemic changes and cerebral atrophy.  NO NEW EXAMS.     LABS REVIEWED PREVIOUS    05-04-20: chol 173; ldl 124; trig 65; hdl 36; hgb a1c 6.4 06-26-20: wbc 11.8; hgb 10.4; hct 34.5; mcv 77.2 plt 354; glucose 135; bun 16; creat  0.61; k+ 4.3; na++ 125; na 9.0 GFR >60; liver normal albumin 3.2 tsh 6.501 06-27-20: mag 1.9 06-28-20: wbc 8.1; hgb 9.0; hct 30.6; mcv 77.7 plt 294 glucose 75; bun 11; creat 0.51; k+ 3.9; na++ 131; ca 8.8 GFR>60 bit B 12: 5070; folate 5.0  07-03-20: glucose 125; bun 19; creat 0.78; k+ 4.3; na++ 121; ca 8.6 GFR >60 07-04-20: glucose 110; bun 20; creat 0.84; k+ 4.0; na++ 122; ca 8.9 GFR >60 07-05-20: glucose 100; bun 16; creat 0.73; k+ 4.0; na++ 125; ca 8.5; GFR >60 07-06-20: wbc 11.9 hgb 9.3; hct 31.0; mcv 75.6 plt 341; glucose 85; bun 18; creat 0.71; k+ 3.9; na++ 128; ca 8.8 liver normal albumin 3.1; GFR >60 free t4: 1.49 free t3: 2.1 07-18-20: glucose 124; bun 23; creat 0.78; k+ 3.8; na++ 133; ca 8.8 GFR >60 07-21-20: d-dimer: 0.99; CRP 0.7   NO NEW LABS.   Review of Systems  Constitutional: Negative for malaise/fatigue.  Respiratory: Negative for cough and shortness of breath.   Cardiovascular: Negative for chest pain, palpitations and leg swelling.  Gastrointestinal: Negative for abdominal pain, constipation and heartburn.  Musculoskeletal: Negative for back pain, joint pain and myalgias.  Skin: Negative.   Neurological: Negative for dizziness.  Psychiatric/Behavioral: The patient is not nervous/anxious.      Physical Exam Constitutional:      General: She is not in acute distress.    Appearance: She is well-developed and well-nourished. She is not diaphoretic.  Neck:     Thyroid: No thyromegaly.  Cardiovascular:     Rate and Rhythm: Normal rate and regular rhythm.     Pulses: Normal pulses and intact distal pulses.     Heart sounds: Normal heart sounds.  Pulmonary:     Effort: Pulmonary effort is normal. No respiratory distress.     Breath sounds: Normal breath sounds.  Abdominal:     General: Bowel  sounds are normal. There is no distension.     Palpations: Abdomen is soft.     Tenderness: There is no abdominal tenderness.  Musculoskeletal:     Cervical back: Neck supple.      Right lower leg: Edema present.     Left lower leg: Edema present.     Comments: Right side weakness: mild   Is able to move all extremities   2+ bilateral lower extremity edema      Lymphadenopathy:     Cervical: No cervical adenopathy.  Skin:    General: Skin is warm and dry.  Neurological:     Mental Status: She is alert. Mental status is at baseline.     Comments: 06-29-20: SLUMS 9/30  Psychiatric:        Mood and Affect: Mood and affect and mood normal.        ASSESSMENT/ PLAN:  TODAY  1. Chronic diastolic congestive heart failure:  No significant change in status. Will continue lasix 40 mg daily and will use ted hose daily.   MD is aware of resident's narcotic use and is in agreement with current plan of care. We will attempt to wean resident as appropriate.  Ok Edwards NP [facility]  Contact [phone] Monday through Friday 8am- 5pm  After hours call [phone]

## 2020-08-06 DIAGNOSIS — I63512 Cerebral infarction due to unspecified occlusion or stenosis of left middle cerebral artery: Secondary | ICD-10-CM | POA: Diagnosis not present

## 2020-08-07 DIAGNOSIS — I63512 Cerebral infarction due to unspecified occlusion or stenosis of left middle cerebral artery: Secondary | ICD-10-CM | POA: Diagnosis not present

## 2020-08-10 DIAGNOSIS — G9341 Metabolic encephalopathy: Secondary | ICD-10-CM | POA: Diagnosis not present

## 2020-08-10 DIAGNOSIS — R279 Unspecified lack of coordination: Secondary | ICD-10-CM | POA: Diagnosis not present

## 2020-08-10 DIAGNOSIS — U071 COVID-19: Secondary | ICD-10-CM | POA: Diagnosis not present

## 2020-08-10 DIAGNOSIS — I639 Cerebral infarction, unspecified: Secondary | ICD-10-CM | POA: Diagnosis not present

## 2020-08-10 DIAGNOSIS — M6281 Muscle weakness (generalized): Secondary | ICD-10-CM | POA: Diagnosis not present

## 2020-08-10 DIAGNOSIS — Z9181 History of falling: Secondary | ICD-10-CM | POA: Diagnosis not present

## 2020-08-10 DIAGNOSIS — Z741 Need for assistance with personal care: Secondary | ICD-10-CM | POA: Diagnosis not present

## 2020-08-11 ENCOUNTER — Other Ambulatory Visit (HOSPITAL_COMMUNITY)
Admission: RE | Admit: 2020-08-11 | Discharge: 2020-08-11 | Disposition: A | Discharge status: Home / Self Care | Payer: Medicare Other | Source: Skilled Nursing Facility | Attending: Adult Health | Admitting: Adult Health

## 2020-08-11 DIAGNOSIS — Z9181 History of falling: Secondary | ICD-10-CM | POA: Diagnosis not present

## 2020-08-11 DIAGNOSIS — U071 COVID-19: Secondary | ICD-10-CM | POA: Diagnosis not present

## 2020-08-11 DIAGNOSIS — I639 Cerebral infarction, unspecified: Secondary | ICD-10-CM | POA: Diagnosis not present

## 2020-08-11 DIAGNOSIS — E1149 Type 2 diabetes mellitus with other diabetic neurological complication: Secondary | ICD-10-CM | POA: Diagnosis not present

## 2020-08-11 DIAGNOSIS — Z741 Need for assistance with personal care: Secondary | ICD-10-CM | POA: Diagnosis not present

## 2020-08-11 DIAGNOSIS — M6281 Muscle weakness (generalized): Secondary | ICD-10-CM | POA: Diagnosis not present

## 2020-08-11 DIAGNOSIS — R279 Unspecified lack of coordination: Secondary | ICD-10-CM | POA: Diagnosis not present

## 2020-08-11 DIAGNOSIS — G9341 Metabolic encephalopathy: Secondary | ICD-10-CM | POA: Diagnosis not present

## 2020-08-11 LAB — BASIC METABOLIC PANEL
Anion gap: 6 (ref 5–15)
BUN: 18 mg/dL (ref 8–23)
CO2: 25 mmol/L (ref 22–32)
Calcium: 8.8 mg/dL — ABNORMAL LOW (ref 8.9–10.3)
Chloride: 105 mmol/L (ref 98–111)
Creatinine, Ser: 0.75 mg/dL (ref 0.44–1.00)
GFR, Estimated: >60 mL/min (ref >60)
Glucose, Bld: 106 mg/dL — ABNORMAL HIGH (ref 70–99)
Potassium: 3.6 mmol/L (ref 3.5–5.1)
Sodium: 136 mmol/L (ref 135–145)

## 2020-08-13 DIAGNOSIS — M6281 Muscle weakness (generalized): Secondary | ICD-10-CM | POA: Diagnosis not present

## 2020-08-13 DIAGNOSIS — Z741 Need for assistance with personal care: Secondary | ICD-10-CM | POA: Diagnosis not present

## 2020-08-13 DIAGNOSIS — Z9181 History of falling: Secondary | ICD-10-CM | POA: Diagnosis not present

## 2020-08-13 DIAGNOSIS — I639 Cerebral infarction, unspecified: Secondary | ICD-10-CM | POA: Diagnosis not present

## 2020-08-13 DIAGNOSIS — R279 Unspecified lack of coordination: Secondary | ICD-10-CM | POA: Diagnosis not present

## 2020-08-13 DIAGNOSIS — G9341 Metabolic encephalopathy: Secondary | ICD-10-CM | POA: Diagnosis not present

## 2020-08-13 DIAGNOSIS — U071 COVID-19: Secondary | ICD-10-CM | POA: Diagnosis not present

## 2020-08-14 ENCOUNTER — Encounter: Payer: Self-pay | Admitting: Adult Health

## 2020-08-14 ENCOUNTER — Non-Acute Institutional Stay (SKILLED_NURSING_FACILITY): Payer: Medicare Other | Admitting: Adult Health

## 2020-08-14 DIAGNOSIS — I152 Hypertension secondary to endocrine disorders: Secondary | ICD-10-CM

## 2020-08-14 DIAGNOSIS — G8929 Other chronic pain: Secondary | ICD-10-CM | POA: Diagnosis not present

## 2020-08-14 DIAGNOSIS — M6281 Muscle weakness (generalized): Secondary | ICD-10-CM | POA: Diagnosis not present

## 2020-08-14 DIAGNOSIS — M25552 Pain in left hip: Secondary | ICD-10-CM | POA: Diagnosis not present

## 2020-08-14 DIAGNOSIS — I4821 Permanent atrial fibrillation: Secondary | ICD-10-CM | POA: Diagnosis not present

## 2020-08-14 DIAGNOSIS — E1159 Type 2 diabetes mellitus with other circulatory complications: Secondary | ICD-10-CM

## 2020-08-14 DIAGNOSIS — Z741 Need for assistance with personal care: Secondary | ICD-10-CM | POA: Diagnosis not present

## 2020-08-14 DIAGNOSIS — M25551 Pain in right hip: Secondary | ICD-10-CM | POA: Diagnosis not present

## 2020-08-14 DIAGNOSIS — U071 COVID-19: Secondary | ICD-10-CM | POA: Diagnosis not present

## 2020-08-14 DIAGNOSIS — M545 Low back pain, unspecified: Secondary | ICD-10-CM | POA: Diagnosis not present

## 2020-08-14 DIAGNOSIS — G9341 Metabolic encephalopathy: Secondary | ICD-10-CM | POA: Diagnosis not present

## 2020-08-14 DIAGNOSIS — Z9181 History of falling: Secondary | ICD-10-CM | POA: Diagnosis not present

## 2020-08-14 DIAGNOSIS — R279 Unspecified lack of coordination: Secondary | ICD-10-CM | POA: Diagnosis not present

## 2020-08-14 DIAGNOSIS — I639 Cerebral infarction, unspecified: Secondary | ICD-10-CM | POA: Diagnosis not present

## 2020-08-14 NOTE — Progress Notes (Signed)
Location:  [facility] Room Number: 428 Place of Service:  SNF (31)   CODE STATUS: full code   Allergies  Allergen Reactions  . Crestor [Rosuvastatin] Other (See Comments)    Cramps  . Lipitor [Atorvastatin] Other (See Comments)    Cramps  . Pravastatin Other (See Comments)    Cramps   . Keflex [Cephalexin] Hives    Chief Complaint  Patient presents with  . Acute Visit    Pain management     HPI:  She is complaining of lower hip pain and lower back pain. The pain is interfering with her ability to participate in therapy. Her blood pressure readings are low and she is bradycardic. She denies any chest pain or shortness of breath.    Past Medical History:  Diagnosis Date  . Arthritis   . Atrial fibrillation ([place])   . Complication of anesthesia   . Diabetes mellitus without complication ([place])   . Diverticulosis   . Fatigue   . Gastric polyp   . Goiter   . Hyperlipidemia   . Hypertension   . Hypothyroid    taken off of thyroid medication 2012014   . Obese   . Osteopenia   . PONV (postoperative nausea and vomiting)   . Postmenopausal   . Rosacea   . 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 Vitamin D deficiency     Past Surgical History:  Procedure Laterality Date  . ABDOMINAL HYSTERECTOMY  1985  . FOOT SURGERY Right 08/2002  . IR CT HEAD LTD  05/03/2020  . IR PERCUTANEOUS ART THROMBECTOMY/INFUSION INTRACRANIAL INC DIAG ANGIO  05/03/2020  . RADIOLOGY WITH ANESTHESIA N/A 05/03/2020   Procedure: IR WITH ANESTHESIA;  Surgeon: Radiologist, Medication, MD;  Location: [facility];  Service: Radiology;  Laterality: N/A;  . right knee arthroscopy   2013   . TONSILLECTOMY    . 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   Procedure: RIGHT TOTAL KNEE ARTHROPLASTY;  Surgeon: Gearlean Alf, MD;  Location: [facility];  Service: Orthopedics;  Laterality: Right;    Social History   Socioeconomic History  . Marital status: Single     Spouse name: Not on file  . Number of children: 1  . Years of education: 64  . Highest education level: 12th grade  Occupational History  . Occupation: retired  Tobacco Use  . Smoking status: Never Smoker  . Smokeless tobacco: Never Used  Vaping Use  . Vaping Use: Never used  Substance and Sexual Activity  . Alcohol use: No  . Drug use: No  . Sexual activity: Not Currently  Other Topics Concern  . Not on file  Social History Narrative  . Not on file   Social Determinants of Health   Financial Resource Strain: Low Risk   . Difficulty of Paying Living Expenses: Not hard at all  Food Insecurity: No Food Insecurity  . Worried About Charity fundraiser in the Last Year: Never true  . Ran Out of Food in the Last Year: Never true  Transportation Needs: No Transportation Needs  . Lack of Transportation (Medical): No  . Lack of Transportation (Non-Medical): No  Physical Activity: Insufficiently Active  . Days of Exercise per Week: 2 days  . Minutes of Exercise per Session: 20 min  Stress: No Stress Concern Present  . Feeling of Stress : Not at all  Social Connections: Moderately Integrated  . Frequency of Communication with Friends and  Family: More than three times a week  . Frequency of Social Gatherings with Friends and Family: More than three times a week  . Attends Religious Services: More than 4 times per year  . Active Member of Clubs or Organizations: Yes  . Attends Archivist Meetings: More than 4 times per year  . Marital Status: Divorced  Human resources officer Violence: Not At Risk  . Fear of Current or Ex-Partner: No  . Emotionally Abused: No  . Physically Abused: No  . Sexually Abused: No   Family History  Problem Relation Age of Onset  . Osteoporosis Mother   . Alzheimer's disease Mother 66  . Arthritis Mother   . Cancer Father        Lung  . Arthritis Sister   . Obesity Sister   . Heart attack Brother 54  . Heart disease Son   . Arthritis Brother    . Arthritis Sister   . Cancer Sister        breast cancer      VITAL SIGNS BP (!) 94/54   Pulse (!) 56   Temp 98.4 F (36.9 C)   Ht 5\' 4"  (1.626 m)   Wt 165 lb 1.6 oz (74.9 kg)   BMI 28.34 kg/m   Outpatient Encounter Medications as of 08/14/2020  Medication Sig  . acetaminophen (TYLENOL) 325 MG tablet Take 2 tablets (650 mg total) by mouth every 4 (four) hours as needed for mild pain (or temp > 37.5 C (99.5 F)).  Marland Kitchen alendronate (FOSAMAX) 70 MG tablet Take 70 mg by mouth once a week. Take with a full glass of water on an empty stomach first thing in the am, 6 am  . antiseptic oral rinse (BIOTENE) LIQD 30 mLs by Mouth Rinse route in the morning, at noon, and at bedtime.  Marland Kitchen apixaban (ELIQUIS) 5 MG TABS tablet Take 1 tablet (5 mg total) by mouth 2 (two) times daily.  Roseanne Kaufman Peru-Castor Oil (VENELEX) OINT Apply topically in the morning, at noon, and at bedtime. ointment; - ; amt: 1 application; topical  . diltiazem (CARDIZEM CD) 240 MG 24 hr capsule Take 1 capsule (240 mg total) by mouth daily.  Marland Kitchen ezetimibe (ZETIA) 10 MG tablet Take 1 tablet (10 mg total) by mouth daily.  . folic acid (FOLVITE) 1 MG tablet Take 1 mg by mouth daily. 8 am  . furosemide (LASIX) 40 MG tablet Take 40 mg by mouth daily.  Marland Kitchen ipratropium (ATROVENT) 0.06 % nasal spray Place 1 spray into both nostrils 2 (two) times daily.  . magnesium oxide (MAG-OX) 400 (241.3 Mg) MG tablet Take 1 tablet (400 mg total) by mouth daily.  . melatonin 3 MG TABS tablet Take 1 tablet (3 mg total) by mouth at bedtime.  . metoprolol succinate (TOPROL-XL) 100 MG 24 hr tablet Take 100 mg by mouth daily. Take with or immediately following a meal.  . NON FORMULARY Accu-check daily. Notify provide of results under 60 or over 400. Once A Day 06:00 AM  . potassium chloride (KLOR-CON) 10 MEQ tablet Take 20 mEq by mouth daily.  . QUEtiapine (SEROQUEL) 25 MG tablet Take 25 mg by mouth at bedtime. 8 pm  . saccharomyces boulardii (FLORASTOR) 250  MG capsule Take 1 capsule (250 mg total) by mouth 2 (two) times daily.  Marland Kitchen senna-docusate (SENOKOT-S) 8.6-50 MG tablet Take 2 tablets by mouth 2 (two) times daily.  . sertraline (ZOLOFT) 50 MG tablet Take 50 mg by mouth daily.  Marland Kitchen  tamsulosin (FLOMAX) 0.4 MG CAPS capsule Take 1 capsule (0.4 mg total) by mouth daily.  Marland Kitchen UNABLE TO FIND DIET:Regular   No facility-administered encounter medications on file as of 08/14/2020.     SIGNIFICANT DIAGNOSTIC EXAMS   PREVIOUS  05-14-20: mri/mra of head:  1. Cortical restricted diffusion within the left temporal occipital region. Scattered foci of restricted diffusion are also seen in the left frontoparietal and insular regions and left amygdala. Findings are consistent with acute infarcts. No hemorrhagic transformation, significant mass effect or midline shift. 2. No proximal occlusion or stenosis in the anterior or posterior circulation.   05-14-20: renal ultrasound:  Foley catheter in place. Urinary bladder decompressed. No hydronephrosis. Small bilateral pleural effusions. No acute findings in the abdomen or pelvis.  06-15-20: ct of abdomen and pelvis:  Foley catheter in place. Urinary bladder decompressed. No hydronephrosis. Small bilateral pleural effusions. No acute findings in the abdomen or pelvis.  06-26-20: chest x-ray:  1. Slight decrease in bilateral pleural effusions, now small. Mild overlying bibasilar opacities, favor atelectasis. 2. Cardiomegaly and pulmonary vascular congestion.  06-26-20: ct of head:  1. No acute intracranial abnormality. 2. Chronic small vessel ischemic changes and cerebral atrophy.  NO NEW EXAMS.     LABS REVIEWED PREVIOUS    05-04-20: chol 173; ldl 124; trig 65; hdl 36; hgb a1c 6.4 06-26-20: wbc 11.8; hgb 10.4; hct 34.5; mcv 77.2 plt 354; glucose 135; bun 16; creat 0.61; k+ 4.3; na++ 125; na 9.0 GFR >60; liver normal albumin 3.2 tsh 6.501 06-27-20: mag 1.9 06-28-20: wbc 8.1; hgb 9.0; hct 30.6; mcv 77.7  plt 294 glucose 75; bun 11; creat 0.51; k+ 3.9; na++ 131; ca 8.8 GFR>60 bit B 12: 5070; folate 5.0  07-03-20: glucose 125; bun 19; creat 0.78; k+ 4.3; na++ 121; ca 8.6 GFR >60 07-04-20: glucose 110; bun 20; creat 0.84; k+ 4.0; na++ 122; ca 8.9 GFR >60 07-05-20: glucose 100; bun 16; creat 0.73; k+ 4.0; na++ 125; ca 8.5; GFR >60 07-06-20: wbc 11.9 hgb 9.3; hct 31.0; mcv 75.6 plt 341; glucose 85; bun 18; creat 0.71; k+ 3.9; na++ 128; ca 8.8 liver normal albumin 3.1; GFR >60 free t4: 1.49 free t3: 2.1 07-18-20: glucose 124; bun 23; creat 0.78; k+ 3.8; na++ 133; ca 8.8 GFR >60 07-21-20: d-dimer: 0.99; CRP 0.7   NO NEW LABS.   Review of Systems  Constitutional: Negative for malaise/fatigue.  Respiratory: Negative for cough and shortness of breath.   Cardiovascular: Negative for chest pain, palpitations and leg swelling.  Gastrointestinal: Negative for abdominal pain, constipation and heartburn.  Musculoskeletal: Positive for back pain and joint pain. Negative for myalgias.       Bilateral hip and bilateral lower back   Skin: Negative.   Neurological: Negative for dizziness.  Psychiatric/Behavioral: The patient is not nervous/anxious.     Physical Exam Constitutional:      General: She is not in acute distress.    Appearance: She is well-developed and well-nourished. She is not diaphoretic.  Neck:     Thyroid: No thyromegaly.  Cardiovascular:     Rate and Rhythm: Normal rate and regular rhythm.     Pulses: Normal pulses and intact distal pulses.     Heart sounds: Normal heart sounds.  Pulmonary:     Effort: Pulmonary effort is normal. No respiratory distress.     Breath sounds: Normal breath sounds.  Abdominal:     General: Bowel sounds are normal. There is no distension.     Palpations:  Abdomen is soft.     Tenderness: There is no abdominal tenderness.  Musculoskeletal:     Cervical back: Neck supple.     Right lower leg: Edema present.     Left lower leg: Edema present.     Comments: Is  able to move all extremities   2+ bilateral lower extremity edema       Lymphadenopathy:     Cervical: No cervical adenopathy.  Skin:    General: Skin is warm and dry.  Neurological:     Mental Status: She is alert. Mental status is at baseline.     Comments: 06-29-20: SLUMS 9/30   Psychiatric:        Mood and Affect: Mood and affect and mood normal.       ASSESSMENT/ PLAN:  TODAY  1. Bilateral hip pain 2. Chronic bilateral low back pain without sciatica 3. Hypertension associated with diabetes 4. Permanent atrial fibrillation  Will change her tylenol to cr 650 mg every 6 hours Due to her bradycardia and soft b/p readings will lower to toprol xl 50 mg daily  Will monitor her status.   MD is aware of resident's narcotic use and is in agreement with current plan of care. We will attempt to wean resident as appropriate.  Ok Edwards NP [facility]  Contact [phone] Monday through Friday 8am- 5pm  After hours call [phone]

## 2020-08-15 ENCOUNTER — Other Ambulatory Visit: Payer: Self-pay

## 2020-08-15 DIAGNOSIS — Z9181 History of falling: Secondary | ICD-10-CM | POA: Diagnosis not present

## 2020-08-15 DIAGNOSIS — I639 Cerebral infarction, unspecified: Secondary | ICD-10-CM | POA: Diagnosis not present

## 2020-08-15 DIAGNOSIS — R279 Unspecified lack of coordination: Secondary | ICD-10-CM | POA: Diagnosis not present

## 2020-08-15 DIAGNOSIS — Z741 Need for assistance with personal care: Secondary | ICD-10-CM | POA: Diagnosis not present

## 2020-08-15 DIAGNOSIS — G9341 Metabolic encephalopathy: Secondary | ICD-10-CM | POA: Diagnosis not present

## 2020-08-15 DIAGNOSIS — U071 COVID-19: Secondary | ICD-10-CM | POA: Diagnosis not present

## 2020-08-15 DIAGNOSIS — M6281 Muscle weakness (generalized): Secondary | ICD-10-CM | POA: Diagnosis not present

## 2020-08-16 DIAGNOSIS — Z9181 History of falling: Secondary | ICD-10-CM | POA: Diagnosis not present

## 2020-08-16 DIAGNOSIS — U071 COVID-19: Secondary | ICD-10-CM | POA: Diagnosis not present

## 2020-08-16 DIAGNOSIS — M6281 Muscle weakness (generalized): Secondary | ICD-10-CM | POA: Diagnosis not present

## 2020-08-16 DIAGNOSIS — I639 Cerebral infarction, unspecified: Secondary | ICD-10-CM | POA: Diagnosis not present

## 2020-08-16 DIAGNOSIS — G9341 Metabolic encephalopathy: Secondary | ICD-10-CM | POA: Diagnosis not present

## 2020-08-16 DIAGNOSIS — R279 Unspecified lack of coordination: Secondary | ICD-10-CM | POA: Diagnosis not present

## 2020-08-16 DIAGNOSIS — Z741 Need for assistance with personal care: Secondary | ICD-10-CM | POA: Diagnosis not present

## 2020-08-17 DIAGNOSIS — U071 COVID-19: Secondary | ICD-10-CM | POA: Diagnosis not present

## 2020-08-17 DIAGNOSIS — M6281 Muscle weakness (generalized): Secondary | ICD-10-CM | POA: Diagnosis not present

## 2020-08-17 DIAGNOSIS — R279 Unspecified lack of coordination: Secondary | ICD-10-CM | POA: Diagnosis not present

## 2020-08-17 DIAGNOSIS — G9341 Metabolic encephalopathy: Secondary | ICD-10-CM | POA: Diagnosis not present

## 2020-08-17 DIAGNOSIS — I639 Cerebral infarction, unspecified: Secondary | ICD-10-CM | POA: Diagnosis not present

## 2020-08-17 DIAGNOSIS — Z9181 History of falling: Secondary | ICD-10-CM | POA: Diagnosis not present

## 2020-08-17 DIAGNOSIS — Z741 Need for assistance with personal care: Secondary | ICD-10-CM | POA: Diagnosis not present

## 2020-08-18 DIAGNOSIS — U071 COVID-19: Secondary | ICD-10-CM | POA: Diagnosis not present

## 2020-08-18 DIAGNOSIS — M6281 Muscle weakness (generalized): Secondary | ICD-10-CM | POA: Diagnosis not present

## 2020-08-18 DIAGNOSIS — I639 Cerebral infarction, unspecified: Secondary | ICD-10-CM | POA: Diagnosis not present

## 2020-08-18 DIAGNOSIS — Z9181 History of falling: Secondary | ICD-10-CM | POA: Diagnosis not present

## 2020-08-18 DIAGNOSIS — G9341 Metabolic encephalopathy: Secondary | ICD-10-CM | POA: Diagnosis not present

## 2020-08-18 DIAGNOSIS — R279 Unspecified lack of coordination: Secondary | ICD-10-CM | POA: Diagnosis not present

## 2020-08-18 DIAGNOSIS — Z741 Need for assistance with personal care: Secondary | ICD-10-CM | POA: Diagnosis not present

## 2020-08-20 DIAGNOSIS — Z9181 History of falling: Secondary | ICD-10-CM | POA: Diagnosis not present

## 2020-08-20 DIAGNOSIS — U071 COVID-19: Secondary | ICD-10-CM | POA: Diagnosis not present

## 2020-08-20 DIAGNOSIS — Z741 Need for assistance with personal care: Secondary | ICD-10-CM | POA: Diagnosis not present

## 2020-08-20 DIAGNOSIS — M6281 Muscle weakness (generalized): Secondary | ICD-10-CM | POA: Diagnosis not present

## 2020-08-20 DIAGNOSIS — I639 Cerebral infarction, unspecified: Secondary | ICD-10-CM | POA: Diagnosis not present

## 2020-08-20 DIAGNOSIS — R279 Unspecified lack of coordination: Secondary | ICD-10-CM | POA: Diagnosis not present

## 2020-08-20 DIAGNOSIS — G9341 Metabolic encephalopathy: Secondary | ICD-10-CM | POA: Diagnosis not present

## 2020-08-21 DIAGNOSIS — Z9181 History of falling: Secondary | ICD-10-CM | POA: Diagnosis not present

## 2020-08-21 DIAGNOSIS — U071 COVID-19: Secondary | ICD-10-CM | POA: Diagnosis not present

## 2020-08-21 DIAGNOSIS — R279 Unspecified lack of coordination: Secondary | ICD-10-CM | POA: Diagnosis not present

## 2020-08-21 DIAGNOSIS — I639 Cerebral infarction, unspecified: Secondary | ICD-10-CM | POA: Diagnosis not present

## 2020-08-21 DIAGNOSIS — M6281 Muscle weakness (generalized): Secondary | ICD-10-CM | POA: Diagnosis not present

## 2020-08-21 DIAGNOSIS — Z741 Need for assistance with personal care: Secondary | ICD-10-CM | POA: Diagnosis not present

## 2020-08-21 DIAGNOSIS — G9341 Metabolic encephalopathy: Secondary | ICD-10-CM | POA: Diagnosis not present

## 2020-08-22 ENCOUNTER — Encounter: Payer: Self-pay | Admitting: Adult Health

## 2020-08-22 ENCOUNTER — Non-Acute Institutional Stay (SKILLED_NURSING_FACILITY): Payer: Medicare Other | Admitting: Adult Health

## 2020-08-22 DIAGNOSIS — U071 COVID-19: Secondary | ICD-10-CM | POA: Diagnosis not present

## 2020-08-22 DIAGNOSIS — I152 Hypertension secondary to endocrine disorders: Secondary | ICD-10-CM | POA: Diagnosis not present

## 2020-08-22 DIAGNOSIS — E1159 Type 2 diabetes mellitus with other circulatory complications: Secondary | ICD-10-CM

## 2020-08-22 DIAGNOSIS — I700 Atherosclerosis of aorta: Secondary | ICD-10-CM

## 2020-08-22 DIAGNOSIS — I5032 Chronic diastolic (congestive) heart failure: Secondary | ICD-10-CM

## 2020-08-22 DIAGNOSIS — R279 Unspecified lack of coordination: Secondary | ICD-10-CM | POA: Diagnosis not present

## 2020-08-22 DIAGNOSIS — G9341 Metabolic encephalopathy: Secondary | ICD-10-CM | POA: Diagnosis not present

## 2020-08-22 DIAGNOSIS — I4821 Permanent atrial fibrillation: Secondary | ICD-10-CM | POA: Diagnosis not present

## 2020-08-22 DIAGNOSIS — M6281 Muscle weakness (generalized): Secondary | ICD-10-CM | POA: Diagnosis not present

## 2020-08-22 DIAGNOSIS — Z9181 History of falling: Secondary | ICD-10-CM | POA: Diagnosis not present

## 2020-08-22 DIAGNOSIS — I639 Cerebral infarction, unspecified: Secondary | ICD-10-CM | POA: Diagnosis not present

## 2020-08-22 DIAGNOSIS — Z741 Need for assistance with personal care: Secondary | ICD-10-CM | POA: Diagnosis not present

## 2020-08-22 DIAGNOSIS — I63512 Cerebral infarction due to unspecified occlusion or stenosis of left middle cerebral artery: Secondary | ICD-10-CM

## 2020-08-22 NOTE — Progress Notes (Signed)
Location:    [facility] Room Number: 129-P Place of Service:  SNF (31)    CODE STATUS: Full Code  Allergies  Allergen Reactions  . Crestor [Rosuvastatin] Other (See Comments)    Cramps  . Lipitor [Atorvastatin] Other (See Comments)    Cramps  . Pravastatin Other (See Comments)    Cramps   . Keflex [Cephalexin] Hives    Chief Complaint  Patient presents with  . Discharge Note    Discharge from [facility]     HPI:  She is being discharged to assisted living with pt/ot. She will not need any dme. Her medications will be provided at the facility and she will follow up medically at the assisted living.  She had been hospitalized for late effect cva. She was admitted to this facility for short term rehab. She has participated in pt/ot; however; she is unable to return back home. She will be going to assisted living.   Past Medical History:  Diagnosis Date  . Arthritis   . Atrial fibrillation ([place])   . Complication of anesthesia   . Diabetes mellitus without complication ([place])   . Diverticulosis   . Fatigue   . Gastric polyp   . Goiter   . Hyperlipidemia   . Hypertension   . Hypothyroid    taken off of thyroid medication 2012014   . Obese   . Osteopenia   . PONV (postoperative nausea and vomiting)   . Postmenopausal   . Rosacea   . Stroke ([place]) 01/15/2013   left thalamic  stroke, small vessel  . Vitamin D deficiency     Past Surgical History:  Procedure Laterality Date  . ABDOMINAL HYSTERECTOMY  1985  . FOOT SURGERY Right 08/2002  . IR CT HEAD LTD  05/03/2020  . IR PERCUTANEOUS ART THROMBECTOMY/INFUSION INTRACRANIAL INC DIAG ANGIO  05/03/2020  . RADIOLOGY WITH ANESTHESIA N/A 05/03/2020   Procedure: IR WITH ANESTHESIA;  Surgeon: Radiologist, Medication, MD;  Location: [facility];  Service: Radiology;  Laterality: N/A;  . right knee arthroscopy   2013   . TONSILLECTOMY    . 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   Procedure: RIGHT TOTAL  KNEE ARTHROPLASTY;  Surgeon: Gearlean Alf, MD;  Location: [facility];  Service: Orthopedics;  Laterality: Right;    Social History   Socioeconomic History  . Marital status: Single    Spouse name: Not on file  . Number of children: 1  . Years of education: 105  . Highest education level: 12th grade  Occupational History  . Occupation: retired  Tobacco Use  . Smoking status: Never Smoker  . Smokeless tobacco: Never Used  Vaping Use  . Vaping Use: Never used  Substance and Sexual Activity  . Alcohol use: No  . Drug use: No  . Sexual activity: Not Currently  Other Topics Concern  . Not on file  Social History Narrative  . Not on file   Social Determinants of Health   Financial Resource Strain: Low Risk   . Difficulty of Paying Living Expenses: Not hard at all  Food Insecurity: No Food Insecurity  . Worried About Charity fundraiser in the Last Year: Never true  . Ran Out of Food in the Last Year: Never true  Transportation Needs: No Transportation Needs  . Lack of Transportation (Medical): No  . Lack of Transportation (Non-Medical): No  Physical Activity: Insufficiently Active  . Days of Exercise per Week: 2 days  . Minutes  of Exercise per Session: 20 min  Stress: No Stress Concern Present  . Feeling of Stress : Not at all  Social Connections: Moderately Integrated  . Frequency of Communication with Friends and Family: More than three times a week  . Frequency of Social Gatherings with Friends and Family: More than three times a week  . Attends Religious Services: More than 4 times per year  . Active Member of Clubs or Organizations: Yes  . Attends Archivist Meetings: More than 4 times per year  . Marital Status: Divorced  Human resources officer Violence: Not At Risk  . Fear of Current or Ex-Partner: No  . Emotionally Abused: No  . Physically Abused: No  . Sexually Abused: No   Family History  Problem Relation Age of Onset  . Osteoporosis Mother   .  Alzheimer's disease Mother 18  . Arthritis Mother   . Cancer Father        Lung  . Arthritis Sister   . Obesity Sister   . Heart attack Brother 63  . Heart disease Son   . Arthritis Brother   . Arthritis Sister   . Cancer Sister        breast cancer    VITAL SIGNS BP 100/64   Pulse 80   Temp 98.2 F (36.8 C)   Resp 20   Ht 5\' 4"  (1.626 m)   Wt 165 lb 1.6 oz (74.9 kg)   SpO2 93%   BMI 28.34 kg/m   Patient's Medications  New Prescriptions   No medications on file  Previous Medications   ACETAMINOPHEN (TYLENOL) 650 MG CR TABLET    Take 650 mg by mouth every 6 (six) hours.   ALENDRONATE (FOSAMAX) 70 MG TABLET    Take 70 mg by mouth once a week. Take with a full glass of water on an empty stomach first thing in the am, 6 am   ANTISEPTIC ORAL RINSE (BIOTENE) LIQD    30 mLs by Mouth Rinse route in the morning, at noon, and at bedtime.   APIXABAN (ELIQUIS) 5 MG TABS TABLET    Take 1 tablet (5 mg total) by mouth 2 (two) times daily.   BALSAM PERU-CASTOR OIL (VENELEX) OINT    Apply topically in the morning, at noon, and at bedtime. ointment; - ; amt: 1 application; topical   DILTIAZEM (CARDIZEM CD) 240 MG 24 HR CAPSULE    Take 1 capsule (240 mg total) by mouth daily.   EZETIMIBE (ZETIA) 10 MG TABLET    Take 1 tablet (10 mg total) by mouth daily.   FOLIC ACID (FOLVITE) 1 MG TABLET    Take 1 mg by mouth daily. 8 am   FUROSEMIDE (LASIX) 40 MG TABLET    Take 40 mg by mouth daily.   IPRATROPIUM (ATROVENT) 0.06 % NASAL SPRAY    Place 1 spray into both nostrils 2 (two) times daily.   MAGNESIUM OXIDE (MAG-OX) 400 (241.3 MG) MG TABLET    Take 1 tablet (400 mg total) by mouth daily.   MELATONIN 3 MG TABS TABLET    Take 1 tablet (3 mg total) by mouth at bedtime.   METOPROLOL SUCCINATE (TOPROL-XL) 50 MG 24 HR TABLET    Take 50 mg by mouth daily. Take with or immediately following a meal.   NON FORMULARY    Accu-check daily. Notify provide of results under 60 or over 400. Once A Day 06:00 AM    POTASSIUM CHLORIDE (KLOR-CON) 10 MEQ TABLET  Take 20 mEq by mouth daily.   QUETIAPINE (SEROQUEL) 25 MG TABLET    Take 25 mg by mouth at bedtime. 8 pm   SACCHAROMYCES BOULARDII (FLORASTOR) 250 MG CAPSULE    Take 1 capsule (250 mg total) by mouth 2 (two) times daily.   SENNA-DOCUSATE (SENOKOT-S) 8.6-50 MG TABLET    Take 2 tablets by mouth 2 (two) times daily.   SERTRALINE (ZOLOFT) 50 MG TABLET    Take 50 mg by mouth daily.   TAMSULOSIN (FLOMAX) 0.4 MG CAPS CAPSULE    Take 1 capsule (0.4 mg total) by mouth daily.   UNABLE TO FIND    DIET:Regular  Modified Medications   No medications on file  Discontinued Medications   No medications on file     SIGNIFICANT DIAGNOSTIC EXAMS  PREVIOUS  05-14-20: mri/mra of head:  1. Cortical restricted diffusion within the left temporal occipital region. Scattered foci of restricted diffusion are also seen in the left frontoparietal and insular regions and left amygdala. Findings are consistent with acute infarcts. No hemorrhagic transformation, significant mass effect or midline shift. 2. No proximal occlusion or stenosis in the anterior or posterior circulation.   05-14-20: renal ultrasound:  Foley catheter in place. Urinary bladder decompressed. No hydronephrosis. Small bilateral pleural effusions. No acute findings in the abdomen or pelvis.  06-15-20: ct of abdomen and pelvis:  Foley catheter in place. Urinary bladder decompressed. No hydronephrosis. Small bilateral pleural effusions. No acute findings in the abdomen or pelvis.  06-26-20: chest x-ray:  1. Slight decrease in bilateral pleural effusions, now small. Mild overlying bibasilar opacities, favor atelectasis. 2. Cardiomegaly and pulmonary vascular congestion.  06-26-20: ct of head:  1. No acute intracranial abnormality. 2. Chronic small vessel ischemic changes and cerebral atrophy.  NO NEW EXAMS.     LABS REVIEWED PREVIOUS    05-04-20: chol 173; ldl 124; trig 65; hdl 36; hgb a1c  6.4 06-26-20: wbc 11.8; hgb 10.4; hct 34.5; mcv 77.2 plt 354; glucose 135; bun 16; creat 0.61; k+ 4.3; na++ 125; na 9.0 GFR >60; liver normal albumin 3.2 tsh 6.501 06-27-20: mag 1.9 06-28-20: wbc 8.1; hgb 9.0; hct 30.6; mcv 77.7 plt 294 glucose 75; bun 11; creat 0.51; k+ 3.9; na++ 131; ca 8.8 GFR>60 bit B 12: 5070; folate 5.0  07-03-20: glucose 125; bun 19; creat 0.78; k+ 4.3; na++ 121; ca 8.6 GFR >60 07-04-20: glucose 110; bun 20; creat 0.84; k+ 4.0; na++ 122; ca 8.9 GFR >60 07-05-20: glucose 100; bun 16; creat 0.73; k+ 4.0; na++ 125; ca 8.5; GFR >60 07-06-20: wbc 11.9 hgb 9.3; hct 31.0; mcv 75.6 plt 341; glucose 85; bun 18; creat 0.71; k+ 3.9; na++ 128; ca 8.8 liver normal albumin 3.1; GFR >60 free t4: 1.49 free t3: 2.1 07-18-20: glucose 124; bun 23; creat 0.78; k+ 3.8; na++ 133; ca 8.8 GFR >60 07-21-20: d-dimer: 0.99; CRP 0.7   NO NEW LABS.   Review of Systems  Constitutional: Negative for malaise/fatigue.  Respiratory: Negative for cough and shortness of breath.   Cardiovascular: Negative for chest pain, palpitations and leg swelling.  Gastrointestinal: Negative for abdominal pain, constipation and heartburn.  Musculoskeletal: Positive for back pain and joint pain. Negative for myalgias.       Has chronic back and hip pain   Skin: Negative.   Neurological: Negative for dizziness.  Psychiatric/Behavioral: The patient is not nervous/anxious.     Physical Exam Constitutional:      General: She is not in acute distress.  Appearance: She is well-developed and well-nourished. She is not diaphoretic.  Neck:     Thyroid: No thyromegaly.  Cardiovascular:     Rate and Rhythm: Normal rate and regular rhythm.     Pulses: Intact distal pulses.     Heart sounds: Normal heart sounds.  Pulmonary:     Effort: Pulmonary effort is normal. No respiratory distress.     Breath sounds: Normal breath sounds.  Abdominal:     General: Bowel sounds are normal. There is no distension.     Palpations: Abdomen  is soft.     Tenderness: There is no abdominal tenderness.  Genitourinary:    Comments: foley Musculoskeletal:        General: Normal range of motion.     Right lower leg: Edema present.     Left lower leg: Edema present.     Comments: Trace bilateral   Lymphadenopathy:     Cervical: No cervical adenopathy.  Skin:    General: Skin is warm and dry.  Neurological:     Mental Status: She is alert. Mental status is at baseline.     Comments: 06-29-20: SLUMS 9/30    Psychiatric:        Mood and Affect: Mood and affect and mood normal.        ASSESSMENT/ PLAN:  Patient is being discharged with the following home health services:  Pt/ot to evaluate and treat as indicated for gait balance strength adl training.   Patient is being discharged with the following durable medical equipment:  None needed   Patient has been advised to f/u with their PCP in 1-2 weeks to bring them up to date on their rehab stay.  Social services at facility was responsible for arranging this appointment.  Pt was provided with a 30 day supply of prescriptions for medications and refills must be obtained from their PCP.  For controlled substances, a more limited supply may be provided adequate until PCP appointment only.  Her medications will be provided by the assisted living  Time spent with patient: 35 minutes: home health medications dme.    Ok Edwards NP [facility]  Contact [phone] Monday through Friday 8am- 5pm  After hours call [phone]

## 2020-08-23 DIAGNOSIS — U071 COVID-19: Secondary | ICD-10-CM | POA: Diagnosis not present

## 2020-08-23 DIAGNOSIS — R279 Unspecified lack of coordination: Secondary | ICD-10-CM | POA: Diagnosis not present

## 2020-08-23 DIAGNOSIS — M6281 Muscle weakness (generalized): Secondary | ICD-10-CM | POA: Diagnosis not present

## 2020-08-23 DIAGNOSIS — Z741 Need for assistance with personal care: Secondary | ICD-10-CM | POA: Diagnosis not present

## 2020-08-23 DIAGNOSIS — I639 Cerebral infarction, unspecified: Secondary | ICD-10-CM | POA: Diagnosis not present

## 2020-08-23 DIAGNOSIS — Z9181 History of falling: Secondary | ICD-10-CM | POA: Diagnosis not present

## 2020-08-23 DIAGNOSIS — G9341 Metabolic encephalopathy: Secondary | ICD-10-CM | POA: Diagnosis not present

## 2020-08-24 DIAGNOSIS — G9341 Metabolic encephalopathy: Secondary | ICD-10-CM | POA: Diagnosis not present

## 2020-08-24 DIAGNOSIS — Z741 Need for assistance with personal care: Secondary | ICD-10-CM | POA: Diagnosis not present

## 2020-08-24 DIAGNOSIS — Z9181 History of falling: Secondary | ICD-10-CM | POA: Diagnosis not present

## 2020-08-24 DIAGNOSIS — I639 Cerebral infarction, unspecified: Secondary | ICD-10-CM | POA: Diagnosis not present

## 2020-08-24 DIAGNOSIS — U071 COVID-19: Secondary | ICD-10-CM | POA: Diagnosis not present

## 2020-08-24 DIAGNOSIS — R279 Unspecified lack of coordination: Secondary | ICD-10-CM | POA: Diagnosis not present

## 2020-08-24 DIAGNOSIS — M6281 Muscle weakness (generalized): Secondary | ICD-10-CM | POA: Diagnosis not present

## 2020-08-25 ENCOUNTER — Ambulatory Visit (INDEPENDENT_AMBULATORY_CARE_PROVIDER_SITE_OTHER): Payer: Medicare Other | Admitting: Urology

## 2020-08-25 ENCOUNTER — Encounter: Payer: Self-pay | Admitting: Urology

## 2020-08-25 VITALS — BP 95/63 | HR 69 | Temp 97.8°F | Ht 64.0 in | Wt 161.0 lb

## 2020-08-25 DIAGNOSIS — Z9181 History of falling: Secondary | ICD-10-CM | POA: Diagnosis not present

## 2020-08-25 DIAGNOSIS — I639 Cerebral infarction, unspecified: Secondary | ICD-10-CM | POA: Diagnosis not present

## 2020-08-25 DIAGNOSIS — M6281 Muscle weakness (generalized): Secondary | ICD-10-CM | POA: Diagnosis not present

## 2020-08-25 DIAGNOSIS — U071 COVID-19: Secondary | ICD-10-CM | POA: Diagnosis not present

## 2020-08-25 DIAGNOSIS — R339 Retention of urine, unspecified: Secondary | ICD-10-CM

## 2020-08-25 DIAGNOSIS — Z741 Need for assistance with personal care: Secondary | ICD-10-CM | POA: Diagnosis not present

## 2020-08-25 DIAGNOSIS — R279 Unspecified lack of coordination: Secondary | ICD-10-CM | POA: Diagnosis not present

## 2020-08-25 DIAGNOSIS — G9341 Metabolic encephalopathy: Secondary | ICD-10-CM | POA: Diagnosis not present

## 2020-08-25 LAB — MICROSCOPIC EXAMINATION
Epithelial Cells (non renal): >10 /hpf — AB (ref 0–10)
Renal Epithel, UA: NONE SEEN /hpf

## 2020-08-25 LAB — URINALYSIS, ROUTINE W REFLEX MICROSCOPIC
Bilirubin, UA: NEGATIVE
Glucose, UA: NEGATIVE
Ketones, UA: NEGATIVE
Nitrite, UA: POSITIVE — AB
RBC, UA: NEGATIVE
Specific Gravity, UA: 1.02 (ref 1.005–1.030)
Urobilinogen, Ur: 0.2 mg/dL (ref 0.2–1.0)
pH, UA: 5 (ref 5.0–7.5)

## 2020-08-25 LAB — BLADDER SCAN AMB NON-IMAGING: Scan Result: 6

## 2020-08-25 NOTE — Progress Notes (Signed)

## 2020-08-25 NOTE — Patient Instructions (Signed)
Acute Urinary Retention, Female  Acute urinary retention is when a person cannot pee (urinate) at all, or can only pee a little. This can come on all of a sudden. If it is not treated, it can lead to kidney problems or other serious problems. What are the causes?  A problem with the tube that drains the bladder (urethra).  Problems with the nerves in the bladder.  The organs in the area between your hip bones (pelvis) slipping out of place (prolapse).  Tumors.  The birth of a baby through the vagina.  An infection.  Having trouble pooping (constipation).  Certain medicines. What increases the risk? Women over age 50 are more at risk. Other conditions also can increase risk. These include:  Diseases, such as multiple sclerosis.  Injury to the spinal cord.  Diabetes.  A condition that affects the way the brain works, such as dementia.  Holding back urine due to trauma or because you do not want to use the bathroom.  History of not being able to pee or peeing too little.  Having had surgery in the area between your hip bones. What are the signs or symptoms?  Trouble peeing.  Pain in the lower belly. How is this treated? Treatment for this condition may include:  Medicines.  Placing a thin, germ-free tube (catheter) into the bladder to drain pee out of the body.  Therapy to treat mental health conditions.  Treatment for conditions that may cause this. If needed, you may be treated in the hospital for kidney problems or to manage other problems. Follow these instructions at home: Medicines  Take over-the-counter and prescription medicines only as told by your doctor. Ask your doctor what medicines you should stay away from.  If you were given an antibiotic medicine, take it as told by your doctor. Do not stop taking it even if you start to feel better. General instructions  Do not smoke or use any products that contain nicotine or tobacco. If you need help  quitting, ask your doctor.  Drink enough fluid to keep your pee pale yellow.  If you were sent home with a tube that drains the bladder, take care of it as told by your doctor.  Watch for changes in your symptoms. Tell your doctor about them.  If told, keep track of changes in your blood pressure at home. Tell your doctor about them.  Keep all follow-up visits. Contact a doctor if:  You have spasms in your bladder that you cannot stop.  You leak pee when you have spasms. Get help right away if:  You have chills or a fever.  You have blood in your pee.  You have a tube that drains pee from the bladder and these things happen: ? The tube stops draining pee. ? The tube falls out. Summary  Acute urinary retention is when you cannot pee at all or you pee too little.  If this is not treated, it can cause kidney problems or other serious problems.  If you were sent home with a tube (catheter) that drains pee from the bladder, take care of it as told by your doctor.  Watch for changes in your symptoms. Tell your doctor about them. This information is not intended to replace advice given to you by your health care provider. Make sure you discuss any questions you have with your health care provider. Document Revised: 03/08/2020 Document Reviewed: 03/08/2020 Elsevier Patient Education  2021 Elsevier Inc.  

## 2020-08-25 NOTE — Progress Notes (Signed)
08/25/2020 11:27 AM   Sheila Ray 10-21-1940 [id number]  Referring provider: Sharion Balloon, [address] [place],  [place] [address]  followup urinary retention  HPI: Sheila Ray is a 80yo here for followup for urinary retention. She is currently on flomax 0.4mg  daily. PVR 6cc. No urinary urgency and no urinary frequency. No incontinence. She is happy with her urination.    PMH: Past Medical History:  Diagnosis Date  . Arthritis   . Atrial fibrillation ([place])   . Complication of anesthesia   . Diabetes mellitus without complication ([place])   . Diverticulosis   . Fatigue   . Gastric polyp   . Goiter   . Hyperlipidemia   . Hypertension   . Hypothyroid    taken off of thyroid medication 2012014   . Obese   . Osteopenia   . PONV (postoperative nausea and vomiting)   . Postmenopausal   . Rosacea   . 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 Vitamin D deficiency     Surgical History: Past Surgical History:  Procedure Laterality Date  . ABDOMINAL HYSTERECTOMY  1985  . FOOT SURGERY Right 08/2002  . IR CT HEAD LTD  05/03/2020  . IR PERCUTANEOUS ART THROMBECTOMY/INFUSION INTRACRANIAL INC DIAG ANGIO  05/03/2020  . RADIOLOGY WITH ANESTHESIA N/A 05/03/2020   Procedure: IR WITH ANESTHESIA;  Surgeon: Radiologist, Medication, MD;  Location: [place];  Service: Radiology;  Laterality: N/A;  . right knee arthroscopy   2013   . TONSILLECTOMY    . 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   Procedure: RIGHT TOTAL KNEE ARTHROPLASTY;  Surgeon: Gearlean Alf, MD;  Location: [facility];  Service: Orthopedics;  Laterality: Right;    Home Medications:  Allergies as of 08/25/2020      Reactions   Crestor [rosuvastatin] Other (See Comments)   Cramps   Lipitor [atorvastatin] Other (See Comments)   Cramps   Pravastatin Other (See Comments)   Cramps    Keflex [cephalexin] Hives      Medication List    Notice   This visit is during an admission. Changes to  the med list made in this visit will be reflected in the After Visit Summary of the admission.     Allergies:  Allergies  Allergen Reactions  . Crestor [Rosuvastatin] Other (See Comments)    Cramps  . Lipitor [Atorvastatin] Other (See Comments)    Cramps  . Pravastatin Other (See Comments)    Cramps   . Keflex [Cephalexin] Hives    Family History: Family History  Problem Relation Age of Onset  . Osteoporosis Mother   . Alzheimer's disease Mother 71  . Arthritis Mother   . Cancer Father        Lung  . Arthritis Sister   . Obesity Sister   . Heart attack Brother 69  . Heart disease Son   . Arthritis Brother   . Arthritis Sister   . Cancer Sister        breast cancer    Social History:  reports that she has never smoked. She has never used smokeless tobacco. She reports that she does not drink alcohol and does not use drugs.  ROS: All other review of systems were reviewed and are negative except what is noted above in HPI  Physical Exam: BP 95/63   Pulse 69   Temp 97.8 F (36.6 C)   Ht 5\' 4"  (1.626 m)   Wt 161  lb (73 kg)   BMI 27.64 kg/m   Constitutional:  Alert and oriented, No acute distress. HEENT: Gray AT, moist mucus membranes.  Trachea midline, no masses. Cardiovascular: No clubbing, cyanosis, or edema. Respiratory: Normal respiratory effort, no increased work of breathing. GI: Abdomen is soft, nontender, nondistended, no abdominal masses GU: No CVA tenderness.  Lymph: No cervical or inguinal lymphadenopathy. Skin: No rashes, bruises or suspicious lesions. Neurologic: Grossly intact, no focal deficits, moving all 4 extremities. Psychiatric: Normal mood and affect.  Laboratory Data: Lab Results  Component Value Date   WBC 11.9 (H) 07/06/2020   HGB 9.3 (L) 07/06/2020   HCT 31.0 (L) 07/06/2020   MCV 75.6 (L) 07/06/2020   PLT 341 07/06/2020    Lab Results  Component Value Date   CREATININE 0.75 08/11/2020    No results found for: PSA  No  results found for: TESTOSTERONE  Lab Results  Component Value Date   HGBA1C 6.4 (H) 05/04/2020    Urinalysis    Component Value Date/Time   COLORURINE YELLOW 06/26/2020 1012   APPEARANCEUR CLOUDY (A) 06/26/2020 1012   LABSPEC 1.015 06/26/2020 1012   PHURINE 5.0 06/26/2020 1012   GLUCOSEU NEGATIVE 06/26/2020 1012   HGBUR SMALL (A) 06/26/2020 1012   BILIRUBINUR NEGATIVE 06/26/2020 1012   KETONESUR 5 (A) 06/26/2020 1012   PROTEINUR 100 (A) 06/26/2020 1012   UROBILINOGEN 0.2 07/19/2014 0950   NITRITE NEGATIVE 06/26/2020 1012   LEUKOCYTESUR LARGE (A) 06/26/2020 1012    Lab Results  Component Value Date   LABMICR 18.0 10/29/2018   BACTERIA RARE (A) 06/26/2020    Pertinent Imaging:  No results found for this or any previous visit.  No results found for this or any previous visit.  No results found for this or any previous visit.  No results found for this or any previous visit.  Results for orders placed during the hospital encounter of 05/03/20  US RENAL  Narrative CLINICAL DATA:  Acute renal insufficiency  EXAM: RENAL / URINARY TRACT ULTRASOUND COMPLETE  COMPARISON:  None.  FINDINGS: Right Kidney:  Renal measurements: 10.3 by 6.3 x 4.7 cm = volume: 157.6 mL. Echogenicity within normal limits. No mass or hydronephrosis visualized. Mild prominence of the right renal pelvis is nonspecific.  Left Kidney:  Renal measurements: 10.2 x 6.1 by 4.9 cm = volume: 161.3 mL. Echogenicity within normal limits. No mass or hydronephrosis visualized.  Bladder:  Decompressed and not evaluated.  Other:  None.  IMPRESSION: 1. Mild distension of the right renal pelvis without frank hydronephrosis. Otherwise unremarkable exam.   Electronically Signed By: Randa Ngo M.D. On: 05/14/2020 15:47  No results found for this or any previous visit.  No results found for this or any previous visit.  No results found for this or any previous visit.   Assessment  & Plan:    1. Urinary retention -continue flomax 0.4mg  daily - Urinalysis, Routine w reflex microscopic - Bladder Scan (Post Void Residual) in office   Return in about 6 months (around 02/22/2021) for PVR.  Nicolette Bang, MD  [facility] [place]

## 2020-08-27 DIAGNOSIS — R279 Unspecified lack of coordination: Secondary | ICD-10-CM | POA: Diagnosis not present

## 2020-08-27 DIAGNOSIS — U071 COVID-19: Secondary | ICD-10-CM | POA: Diagnosis not present

## 2020-08-27 DIAGNOSIS — Z9181 History of falling: Secondary | ICD-10-CM | POA: Diagnosis not present

## 2020-08-27 DIAGNOSIS — M6281 Muscle weakness (generalized): Secondary | ICD-10-CM | POA: Diagnosis not present

## 2020-08-27 DIAGNOSIS — G9341 Metabolic encephalopathy: Secondary | ICD-10-CM | POA: Diagnosis not present

## 2020-08-27 DIAGNOSIS — Z741 Need for assistance with personal care: Secondary | ICD-10-CM | POA: Diagnosis not present

## 2020-08-27 DIAGNOSIS — I639 Cerebral infarction, unspecified: Secondary | ICD-10-CM | POA: Diagnosis not present

## 2020-08-29 ENCOUNTER — Other Ambulatory Visit: Payer: Self-pay

## 2020-08-29 ENCOUNTER — Encounter: Payer: Self-pay | Admitting: Urology

## 2020-08-29 DIAGNOSIS — E039 Hypothyroidism, unspecified: Secondary | ICD-10-CM | POA: Diagnosis not present

## 2020-08-29 DIAGNOSIS — G309 Alzheimer's disease, unspecified: Secondary | ICD-10-CM | POA: Diagnosis not present

## 2020-08-29 DIAGNOSIS — N319 Neuromuscular dysfunction of bladder, unspecified: Secondary | ICD-10-CM | POA: Diagnosis not present

## 2020-08-29 DIAGNOSIS — E119 Type 2 diabetes mellitus without complications: Secondary | ICD-10-CM | POA: Diagnosis not present

## 2020-08-29 DIAGNOSIS — I10 Essential (primary) hypertension: Secondary | ICD-10-CM | POA: Diagnosis not present

## 2020-08-29 DIAGNOSIS — I4891 Unspecified atrial fibrillation: Secondary | ICD-10-CM | POA: Diagnosis not present

## 2020-08-31 DIAGNOSIS — I4891 Unspecified atrial fibrillation: Secondary | ICD-10-CM | POA: Diagnosis not present

## 2020-08-31 DIAGNOSIS — I7090 Unspecified atherosclerosis: Secondary | ICD-10-CM | POA: Diagnosis not present

## 2020-08-31 DIAGNOSIS — L89622 Pressure ulcer of left heel, stage 2: Secondary | ICD-10-CM | POA: Diagnosis not present

## 2020-08-31 DIAGNOSIS — G309 Alzheimer's disease, unspecified: Secondary | ICD-10-CM | POA: Diagnosis not present

## 2020-08-31 DIAGNOSIS — E039 Hypothyroidism, unspecified: Secondary | ICD-10-CM | POA: Diagnosis not present

## 2020-08-31 DIAGNOSIS — I10 Essential (primary) hypertension: Secondary | ICD-10-CM | POA: Diagnosis not present

## 2020-08-31 DIAGNOSIS — Z9181 History of falling: Secondary | ICD-10-CM | POA: Diagnosis not present

## 2020-08-31 DIAGNOSIS — E119 Type 2 diabetes mellitus without complications: Secondary | ICD-10-CM | POA: Diagnosis not present

## 2020-08-31 DIAGNOSIS — N319 Neuromuscular dysfunction of bladder, unspecified: Secondary | ICD-10-CM | POA: Diagnosis not present

## 2020-08-31 DIAGNOSIS — Z7901 Long term (current) use of anticoagulants: Secondary | ICD-10-CM | POA: Diagnosis not present

## 2020-09-01 DIAGNOSIS — E119 Type 2 diabetes mellitus without complications: Secondary | ICD-10-CM | POA: Diagnosis not present

## 2020-09-01 DIAGNOSIS — E559 Vitamin D deficiency, unspecified: Secondary | ICD-10-CM | POA: Diagnosis not present

## 2020-09-03 DIAGNOSIS — I63512 Cerebral infarction due to unspecified occlusion or stenosis of left middle cerebral artery: Secondary | ICD-10-CM | POA: Diagnosis not present

## 2020-09-04 DIAGNOSIS — N319 Neuromuscular dysfunction of bladder, unspecified: Secondary | ICD-10-CM | POA: Diagnosis not present

## 2020-09-04 DIAGNOSIS — E039 Hypothyroidism, unspecified: Secondary | ICD-10-CM | POA: Diagnosis not present

## 2020-09-04 DIAGNOSIS — Z9181 History of falling: Secondary | ICD-10-CM | POA: Diagnosis not present

## 2020-09-04 DIAGNOSIS — I10 Essential (primary) hypertension: Secondary | ICD-10-CM | POA: Diagnosis not present

## 2020-09-04 DIAGNOSIS — I7090 Unspecified atherosclerosis: Secondary | ICD-10-CM | POA: Diagnosis not present

## 2020-09-04 DIAGNOSIS — L89622 Pressure ulcer of left heel, stage 2: Secondary | ICD-10-CM | POA: Diagnosis not present

## 2020-09-04 DIAGNOSIS — Z7901 Long term (current) use of anticoagulants: Secondary | ICD-10-CM | POA: Diagnosis not present

## 2020-09-04 DIAGNOSIS — I63512 Cerebral infarction due to unspecified occlusion or stenosis of left middle cerebral artery: Secondary | ICD-10-CM | POA: Diagnosis not present

## 2020-09-04 DIAGNOSIS — I4891 Unspecified atrial fibrillation: Secondary | ICD-10-CM | POA: Diagnosis not present

## 2020-09-04 DIAGNOSIS — G309 Alzheimer's disease, unspecified: Secondary | ICD-10-CM | POA: Diagnosis not present

## 2020-09-04 DIAGNOSIS — E119 Type 2 diabetes mellitus without complications: Secondary | ICD-10-CM | POA: Diagnosis not present

## 2020-09-05 DIAGNOSIS — E559 Vitamin D deficiency, unspecified: Secondary | ICD-10-CM | POA: Diagnosis not present

## 2020-09-05 DIAGNOSIS — E119 Type 2 diabetes mellitus without complications: Secondary | ICD-10-CM | POA: Diagnosis not present

## 2020-09-05 DIAGNOSIS — I10 Essential (primary) hypertension: Secondary | ICD-10-CM | POA: Diagnosis not present

## 2020-09-05 DIAGNOSIS — E039 Hypothyroidism, unspecified: Secondary | ICD-10-CM | POA: Diagnosis not present

## 2020-09-05 DIAGNOSIS — G309 Alzheimer's disease, unspecified: Secondary | ICD-10-CM | POA: Diagnosis not present

## 2020-09-06 DIAGNOSIS — G309 Alzheimer's disease, unspecified: Secondary | ICD-10-CM | POA: Diagnosis not present

## 2020-09-06 DIAGNOSIS — E039 Hypothyroidism, unspecified: Secondary | ICD-10-CM | POA: Diagnosis not present

## 2020-09-06 DIAGNOSIS — Z9181 History of falling: Secondary | ICD-10-CM | POA: Diagnosis not present

## 2020-09-06 DIAGNOSIS — E119 Type 2 diabetes mellitus without complications: Secondary | ICD-10-CM | POA: Diagnosis not present

## 2020-09-06 DIAGNOSIS — I7090 Unspecified atherosclerosis: Secondary | ICD-10-CM | POA: Diagnosis not present

## 2020-09-06 DIAGNOSIS — N319 Neuromuscular dysfunction of bladder, unspecified: Secondary | ICD-10-CM | POA: Diagnosis not present

## 2020-09-06 DIAGNOSIS — Z7901 Long term (current) use of anticoagulants: Secondary | ICD-10-CM | POA: Diagnosis not present

## 2020-09-06 DIAGNOSIS — L89622 Pressure ulcer of left heel, stage 2: Secondary | ICD-10-CM | POA: Diagnosis not present

## 2020-09-06 DIAGNOSIS — I10 Essential (primary) hypertension: Secondary | ICD-10-CM | POA: Diagnosis not present

## 2020-09-06 DIAGNOSIS — I4891 Unspecified atrial fibrillation: Secondary | ICD-10-CM | POA: Diagnosis not present

## 2020-09-11 DIAGNOSIS — I7090 Unspecified atherosclerosis: Secondary | ICD-10-CM | POA: Diagnosis not present

## 2020-09-11 DIAGNOSIS — I639 Cerebral infarction, unspecified: Secondary | ICD-10-CM | POA: Diagnosis not present

## 2020-09-11 DIAGNOSIS — I10 Essential (primary) hypertension: Secondary | ICD-10-CM | POA: Diagnosis not present

## 2020-09-11 DIAGNOSIS — E119 Type 2 diabetes mellitus without complications: Secondary | ICD-10-CM | POA: Diagnosis not present

## 2020-09-11 DIAGNOSIS — Z9181 History of falling: Secondary | ICD-10-CM | POA: Diagnosis not present

## 2020-09-11 DIAGNOSIS — L89622 Pressure ulcer of left heel, stage 2: Secondary | ICD-10-CM | POA: Diagnosis not present

## 2020-09-11 DIAGNOSIS — G309 Alzheimer's disease, unspecified: Secondary | ICD-10-CM | POA: Diagnosis not present

## 2020-09-11 DIAGNOSIS — N319 Neuromuscular dysfunction of bladder, unspecified: Secondary | ICD-10-CM | POA: Diagnosis not present

## 2020-09-11 DIAGNOSIS — Z7901 Long term (current) use of anticoagulants: Secondary | ICD-10-CM | POA: Diagnosis not present

## 2020-09-11 DIAGNOSIS — I4891 Unspecified atrial fibrillation: Secondary | ICD-10-CM | POA: Diagnosis not present

## 2020-09-11 DIAGNOSIS — E039 Hypothyroidism, unspecified: Secondary | ICD-10-CM | POA: Diagnosis not present

## 2020-09-13 DIAGNOSIS — I7090 Unspecified atherosclerosis: Secondary | ICD-10-CM | POA: Diagnosis not present

## 2020-09-13 DIAGNOSIS — G309 Alzheimer's disease, unspecified: Secondary | ICD-10-CM | POA: Diagnosis not present

## 2020-09-13 DIAGNOSIS — I4891 Unspecified atrial fibrillation: Secondary | ICD-10-CM | POA: Diagnosis not present

## 2020-09-13 DIAGNOSIS — Z7901 Long term (current) use of anticoagulants: Secondary | ICD-10-CM | POA: Diagnosis not present

## 2020-09-13 DIAGNOSIS — I509 Heart failure, unspecified: Secondary | ICD-10-CM | POA: Diagnosis not present

## 2020-09-13 DIAGNOSIS — L89622 Pressure ulcer of left heel, stage 2: Secondary | ICD-10-CM | POA: Diagnosis not present

## 2020-09-13 DIAGNOSIS — J810 Acute pulmonary edema: Secondary | ICD-10-CM | POA: Diagnosis not present

## 2020-09-13 DIAGNOSIS — I10 Essential (primary) hypertension: Secondary | ICD-10-CM | POA: Diagnosis not present

## 2020-09-13 DIAGNOSIS — Z9181 History of falling: Secondary | ICD-10-CM | POA: Diagnosis not present

## 2020-09-13 DIAGNOSIS — E119 Type 2 diabetes mellitus without complications: Secondary | ICD-10-CM | POA: Diagnosis not present

## 2020-09-13 DIAGNOSIS — E039 Hypothyroidism, unspecified: Secondary | ICD-10-CM | POA: Diagnosis not present

## 2020-09-13 DIAGNOSIS — N319 Neuromuscular dysfunction of bladder, unspecified: Secondary | ICD-10-CM | POA: Diagnosis not present

## 2020-09-14 DIAGNOSIS — I10 Essential (primary) hypertension: Secondary | ICD-10-CM | POA: Diagnosis not present

## 2020-09-14 DIAGNOSIS — E039 Hypothyroidism, unspecified: Secondary | ICD-10-CM | POA: Diagnosis not present

## 2020-09-14 DIAGNOSIS — I4891 Unspecified atrial fibrillation: Secondary | ICD-10-CM | POA: Diagnosis not present

## 2020-09-14 DIAGNOSIS — Z9181 History of falling: Secondary | ICD-10-CM | POA: Diagnosis not present

## 2020-09-14 DIAGNOSIS — N319 Neuromuscular dysfunction of bladder, unspecified: Secondary | ICD-10-CM | POA: Diagnosis not present

## 2020-09-14 DIAGNOSIS — L89622 Pressure ulcer of left heel, stage 2: Secondary | ICD-10-CM | POA: Diagnosis not present

## 2020-09-14 DIAGNOSIS — Z7901 Long term (current) use of anticoagulants: Secondary | ICD-10-CM | POA: Diagnosis not present

## 2020-09-14 DIAGNOSIS — I7090 Unspecified atherosclerosis: Secondary | ICD-10-CM | POA: Diagnosis not present

## 2020-09-14 DIAGNOSIS — E119 Type 2 diabetes mellitus without complications: Secondary | ICD-10-CM | POA: Diagnosis not present

## 2020-09-14 DIAGNOSIS — G309 Alzheimer's disease, unspecified: Secondary | ICD-10-CM | POA: Diagnosis not present

## 2020-09-15 DIAGNOSIS — L89622 Pressure ulcer of left heel, stage 2: Secondary | ICD-10-CM | POA: Diagnosis not present

## 2020-09-15 DIAGNOSIS — I639 Cerebral infarction, unspecified: Secondary | ICD-10-CM | POA: Diagnosis not present

## 2020-09-18 DIAGNOSIS — L89622 Pressure ulcer of left heel, stage 2: Secondary | ICD-10-CM | POA: Diagnosis not present

## 2020-09-18 DIAGNOSIS — N319 Neuromuscular dysfunction of bladder, unspecified: Secondary | ICD-10-CM | POA: Diagnosis not present

## 2020-09-18 DIAGNOSIS — E039 Hypothyroidism, unspecified: Secondary | ICD-10-CM | POA: Diagnosis not present

## 2020-09-18 DIAGNOSIS — E119 Type 2 diabetes mellitus without complications: Secondary | ICD-10-CM | POA: Diagnosis not present

## 2020-09-18 DIAGNOSIS — Z7901 Long term (current) use of anticoagulants: Secondary | ICD-10-CM | POA: Diagnosis not present

## 2020-09-18 DIAGNOSIS — I4891 Unspecified atrial fibrillation: Secondary | ICD-10-CM | POA: Diagnosis not present

## 2020-09-18 DIAGNOSIS — I7090 Unspecified atherosclerosis: Secondary | ICD-10-CM | POA: Diagnosis not present

## 2020-09-18 DIAGNOSIS — G309 Alzheimer's disease, unspecified: Secondary | ICD-10-CM | POA: Diagnosis not present

## 2020-09-18 DIAGNOSIS — I10 Essential (primary) hypertension: Secondary | ICD-10-CM | POA: Diagnosis not present

## 2020-09-18 DIAGNOSIS — Z9181 History of falling: Secondary | ICD-10-CM | POA: Diagnosis not present

## 2020-09-19 DIAGNOSIS — R600 Localized edema: Secondary | ICD-10-CM | POA: Diagnosis not present

## 2020-09-19 DIAGNOSIS — E039 Hypothyroidism, unspecified: Secondary | ICD-10-CM | POA: Diagnosis not present

## 2020-09-19 DIAGNOSIS — I4891 Unspecified atrial fibrillation: Secondary | ICD-10-CM | POA: Diagnosis not present

## 2020-09-19 DIAGNOSIS — I10 Essential (primary) hypertension: Secondary | ICD-10-CM | POA: Diagnosis not present

## 2020-09-19 DIAGNOSIS — I7090 Unspecified atherosclerosis: Secondary | ICD-10-CM | POA: Diagnosis not present

## 2020-09-19 DIAGNOSIS — I509 Heart failure, unspecified: Secondary | ICD-10-CM | POA: Diagnosis not present

## 2020-09-19 DIAGNOSIS — E119 Type 2 diabetes mellitus without complications: Secondary | ICD-10-CM | POA: Diagnosis not present

## 2020-09-19 DIAGNOSIS — Z7901 Long term (current) use of anticoagulants: Secondary | ICD-10-CM | POA: Diagnosis not present

## 2020-09-19 DIAGNOSIS — Z9181 History of falling: Secondary | ICD-10-CM | POA: Diagnosis not present

## 2020-09-19 DIAGNOSIS — N319 Neuromuscular dysfunction of bladder, unspecified: Secondary | ICD-10-CM | POA: Diagnosis not present

## 2020-09-19 DIAGNOSIS — G309 Alzheimer's disease, unspecified: Secondary | ICD-10-CM | POA: Diagnosis not present

## 2020-09-19 DIAGNOSIS — L89622 Pressure ulcer of left heel, stage 2: Secondary | ICD-10-CM | POA: Diagnosis not present

## 2020-09-20 DIAGNOSIS — Z7901 Long term (current) use of anticoagulants: Secondary | ICD-10-CM | POA: Diagnosis not present

## 2020-09-20 DIAGNOSIS — Z9181 History of falling: Secondary | ICD-10-CM | POA: Diagnosis not present

## 2020-09-20 DIAGNOSIS — I4891 Unspecified atrial fibrillation: Secondary | ICD-10-CM | POA: Diagnosis not present

## 2020-09-20 DIAGNOSIS — G309 Alzheimer's disease, unspecified: Secondary | ICD-10-CM | POA: Diagnosis not present

## 2020-09-20 DIAGNOSIS — N319 Neuromuscular dysfunction of bladder, unspecified: Secondary | ICD-10-CM | POA: Diagnosis not present

## 2020-09-20 DIAGNOSIS — I7090 Unspecified atherosclerosis: Secondary | ICD-10-CM | POA: Diagnosis not present

## 2020-09-20 DIAGNOSIS — E119 Type 2 diabetes mellitus without complications: Secondary | ICD-10-CM | POA: Diagnosis not present

## 2020-09-20 DIAGNOSIS — I10 Essential (primary) hypertension: Secondary | ICD-10-CM | POA: Diagnosis not present

## 2020-09-20 DIAGNOSIS — L89622 Pressure ulcer of left heel, stage 2: Secondary | ICD-10-CM | POA: Diagnosis not present

## 2020-09-20 DIAGNOSIS — E039 Hypothyroidism, unspecified: Secondary | ICD-10-CM | POA: Diagnosis not present

## 2020-09-21 DIAGNOSIS — L89622 Pressure ulcer of left heel, stage 2: Secondary | ICD-10-CM | POA: Diagnosis not present

## 2020-09-21 DIAGNOSIS — N319 Neuromuscular dysfunction of bladder, unspecified: Secondary | ICD-10-CM | POA: Diagnosis not present

## 2020-09-21 DIAGNOSIS — I10 Essential (primary) hypertension: Secondary | ICD-10-CM | POA: Diagnosis not present

## 2020-09-21 DIAGNOSIS — E039 Hypothyroidism, unspecified: Secondary | ICD-10-CM | POA: Diagnosis not present

## 2020-09-21 DIAGNOSIS — I7090 Unspecified atherosclerosis: Secondary | ICD-10-CM | POA: Diagnosis not present

## 2020-09-21 DIAGNOSIS — I2722 Pulmonary hypertension due to left heart disease: Secondary | ICD-10-CM | POA: Diagnosis not present

## 2020-09-21 DIAGNOSIS — R531 Weakness: Secondary | ICD-10-CM | POA: Diagnosis not present

## 2020-09-21 DIAGNOSIS — I43 Cardiomyopathy in diseases classified elsewhere: Secondary | ICD-10-CM | POA: Diagnosis not present

## 2020-09-21 DIAGNOSIS — Z7901 Long term (current) use of anticoagulants: Secondary | ICD-10-CM | POA: Diagnosis not present

## 2020-09-21 DIAGNOSIS — A419 Sepsis, unspecified organism: Secondary | ICD-10-CM | POA: Diagnosis not present

## 2020-09-21 DIAGNOSIS — E854 Organ-limited amyloidosis: Secondary | ICD-10-CM | POA: Diagnosis not present

## 2020-09-21 DIAGNOSIS — G9341 Metabolic encephalopathy: Secondary | ICD-10-CM | POA: Diagnosis not present

## 2020-09-21 DIAGNOSIS — I083 Combined rheumatic disorders of mitral, aortic and tricuspid valves: Secondary | ICD-10-CM | POA: Diagnosis not present

## 2020-09-21 DIAGNOSIS — M1990 Unspecified osteoarthritis, unspecified site: Secondary | ICD-10-CM | POA: Diagnosis not present

## 2020-09-21 DIAGNOSIS — D649 Anemia, unspecified: Secondary | ICD-10-CM | POA: Diagnosis not present

## 2020-09-21 DIAGNOSIS — Z9181 History of falling: Secondary | ICD-10-CM | POA: Diagnosis not present

## 2020-09-21 DIAGNOSIS — I4819 Other persistent atrial fibrillation: Secondary | ICD-10-CM | POA: Diagnosis not present

## 2020-09-21 DIAGNOSIS — E119 Type 2 diabetes mellitus without complications: Secondary | ICD-10-CM | POA: Diagnosis not present

## 2020-09-21 DIAGNOSIS — Z515 Encounter for palliative care: Secondary | ICD-10-CM | POA: Diagnosis not present

## 2020-09-21 DIAGNOSIS — Z8673 Personal history of transient ischemic attack (TIA), and cerebral infarction without residual deficits: Secondary | ICD-10-CM | POA: Diagnosis not present

## 2020-09-21 DIAGNOSIS — I110 Hypertensive heart disease with heart failure: Secondary | ICD-10-CM | POA: Diagnosis not present

## 2020-09-21 DIAGNOSIS — Z66 Do not resuscitate: Secondary | ICD-10-CM | POA: Diagnosis not present

## 2020-09-21 DIAGNOSIS — E872 Acidosis: Secondary | ICD-10-CM | POA: Diagnosis not present

## 2020-09-21 DIAGNOSIS — G309 Alzheimer's disease, unspecified: Secondary | ICD-10-CM | POA: Diagnosis not present

## 2020-09-21 DIAGNOSIS — I5033 Acute on chronic diastolic (congestive) heart failure: Secondary | ICD-10-CM | POA: Diagnosis not present

## 2020-09-21 DIAGNOSIS — R600 Localized edema: Secondary | ICD-10-CM | POA: Diagnosis not present

## 2020-09-21 DIAGNOSIS — I4891 Unspecified atrial fibrillation: Secondary | ICD-10-CM | POA: Diagnosis not present

## 2020-09-21 DIAGNOSIS — I081 Rheumatic disorders of both mitral and tricuspid valves: Secondary | ICD-10-CM | POA: Diagnosis not present

## 2020-09-21 DIAGNOSIS — J90 Pleural effusion, not elsewhere classified: Secondary | ICD-10-CM | POA: Diagnosis not present

## 2020-09-21 DIAGNOSIS — Z881 Allergy status to other antibiotic agents status: Secondary | ICD-10-CM | POA: Diagnosis not present

## 2020-09-21 DIAGNOSIS — F32A Depression, unspecified: Secondary | ICD-10-CM | POA: Diagnosis not present

## 2020-09-21 DIAGNOSIS — Z9660 Presence of unspecified orthopedic joint implant: Secondary | ICD-10-CM | POA: Diagnosis not present

## 2020-09-21 DIAGNOSIS — I517 Cardiomegaly: Secondary | ICD-10-CM | POA: Diagnosis not present

## 2020-09-21 DIAGNOSIS — R197 Diarrhea, unspecified: Secondary | ICD-10-CM | POA: Diagnosis not present

## 2020-09-21 DIAGNOSIS — I4811 Longstanding persistent atrial fibrillation: Secondary | ICD-10-CM | POA: Diagnosis not present

## 2020-09-21 DIAGNOSIS — I959 Hypotension, unspecified: Secondary | ICD-10-CM | POA: Diagnosis not present

## 2020-09-29 DEATH — deceased

## 2020-10-11 ENCOUNTER — Ambulatory Visit: Payer: Medicare Other | Admitting: Adult Health

## 2021-02-23 ENCOUNTER — Ambulatory Visit: Payer: Medicare Other | Admitting: Urology
